# Patient Record
Sex: Female | Born: 1960
Health system: Southern US, Community
[De-identification: ages and names within clinical notes are randomized; demographics above are authoritative.]

## PROBLEM LIST (undated history)

## (undated) DIAGNOSIS — R600 Localized edema: Secondary | ICD-10-CM

## (undated) DIAGNOSIS — E1142 Type 2 diabetes mellitus with diabetic polyneuropathy: Secondary | ICD-10-CM

## (undated) DIAGNOSIS — G250 Essential tremor: Secondary | ICD-10-CM

## (undated) DIAGNOSIS — K0889 Other specified disorders of teeth and supporting structures: Secondary | ICD-10-CM

## (undated) DIAGNOSIS — R4584 Anhedonia: Secondary | ICD-10-CM

## (undated) DIAGNOSIS — Z9889 Other specified postprocedural states: Secondary | ICD-10-CM

## (undated) DIAGNOSIS — I2510 Atherosclerotic heart disease of native coronary artery without angina pectoris: Secondary | ICD-10-CM

## (undated) DIAGNOSIS — M810 Age-related osteoporosis without current pathological fracture: Secondary | ICD-10-CM

## (undated) DIAGNOSIS — M79606 Pain in leg, unspecified: Secondary | ICD-10-CM

## (undated) DIAGNOSIS — N189 Chronic kidney disease, unspecified: Secondary | ICD-10-CM

## (undated) DIAGNOSIS — R000 Tachycardia, unspecified: Secondary | ICD-10-CM

## (undated) DIAGNOSIS — E782 Mixed hyperlipidemia: Secondary | ICD-10-CM

## (undated) DIAGNOSIS — M549 Dorsalgia, unspecified: Secondary | ICD-10-CM

## (undated) DIAGNOSIS — R231 Pallor: Secondary | ICD-10-CM

## (undated) DIAGNOSIS — R633 Feeding difficulties: Secondary | ICD-10-CM

## (undated) DIAGNOSIS — E113299 Type 2 diabetes mellitus with mild nonproliferative diabetic retinopathy without macular edema, unspecified eye: Secondary | ICD-10-CM

## (undated) DIAGNOSIS — R682 Dry mouth, unspecified: Secondary | ICD-10-CM

## (undated) DIAGNOSIS — H3530 Unspecified macular degeneration: Secondary | ICD-10-CM

## (undated) DIAGNOSIS — G4700 Insomnia, unspecified: Secondary | ICD-10-CM

## (undated) DIAGNOSIS — R7989 Other specified abnormal findings of blood chemistry: Secondary | ICD-10-CM

## (undated) DIAGNOSIS — T7840XA Allergy, unspecified, initial encounter: Secondary | ICD-10-CM

## (undated) DIAGNOSIS — F5104 Psychophysiologic insomnia: Secondary | ICD-10-CM

## (undated) DIAGNOSIS — K5900 Constipation, unspecified: Secondary | ICD-10-CM

## (undated) DIAGNOSIS — R1013 Epigastric pain: Secondary | ICD-10-CM

## (undated) DIAGNOSIS — R198 Other specified symptoms and signs involving the digestive system and abdomen: Secondary | ICD-10-CM

## (undated) DIAGNOSIS — K760 Fatty (change of) liver, not elsewhere classified: Secondary | ICD-10-CM

## (undated) DIAGNOSIS — R5383 Other fatigue: Secondary | ICD-10-CM

## (undated) DIAGNOSIS — J45909 Unspecified asthma, uncomplicated: Secondary | ICD-10-CM

## (undated) DIAGNOSIS — F32A Depression, unspecified: Secondary | ICD-10-CM

## (undated) DIAGNOSIS — E66813 Obesity, class 3: Secondary | ICD-10-CM

## (undated) DIAGNOSIS — F419 Anxiety disorder, unspecified: Secondary | ICD-10-CM

## (undated) DIAGNOSIS — R1310 Dysphagia, unspecified: Secondary | ICD-10-CM

## (undated) DIAGNOSIS — E1143 Type 2 diabetes mellitus with diabetic autonomic (poly)neuropathy: Secondary | ICD-10-CM

## (undated) DIAGNOSIS — K219 Gastro-esophageal reflux disease without esophagitis: Secondary | ICD-10-CM

## (undated) DIAGNOSIS — F329 Major depressive disorder, single episode, unspecified: Secondary | ICD-10-CM

## (undated) DIAGNOSIS — E7211 Homocystinuria: Secondary | ICD-10-CM

## (undated) DIAGNOSIS — K140 Glossitis: Secondary | ICD-10-CM

## (undated) DIAGNOSIS — E211 Secondary hyperparathyroidism, not elsewhere classified: Secondary | ICD-10-CM

## (undated) DIAGNOSIS — I10 Essential (primary) hypertension: Secondary | ICD-10-CM

## (undated) DIAGNOSIS — K649 Unspecified hemorrhoids: Secondary | ICD-10-CM

## (undated) DIAGNOSIS — N951 Menopausal and female climacteric states: Secondary | ICD-10-CM

## (undated) DIAGNOSIS — R0600 Dyspnea, unspecified: Secondary | ICD-10-CM

## (undated) DIAGNOSIS — D509 Iron deficiency anemia, unspecified: Secondary | ICD-10-CM

## (undated) DIAGNOSIS — E063 Autoimmune thyroiditis: Secondary | ICD-10-CM

## (undated) DIAGNOSIS — M8580 Other specified disorders of bone density and structure, unspecified site: Secondary | ICD-10-CM

## (undated) DIAGNOSIS — E049 Nontoxic goiter, unspecified: Secondary | ICD-10-CM

## (undated) DIAGNOSIS — E119 Type 2 diabetes mellitus without complications: Secondary | ICD-10-CM

## (undated) DIAGNOSIS — R112 Nausea with vomiting, unspecified: Secondary | ICD-10-CM

## (undated) DIAGNOSIS — E785 Hyperlipidemia, unspecified: Secondary | ICD-10-CM

## (undated) HISTORY — DX: Homocystinuria: E72.11

## (undated) HISTORY — DX: Localized edema: R60.0

## (undated) HISTORY — DX: Type 2 diabetes mellitus without complications: E11.9

## (undated) HISTORY — DX: Anxiety disorder, unspecified: F41.9

## (undated) HISTORY — DX: Pallor: R23.1

## (undated) HISTORY — PX: TONSILLECTOMY: SUR1361

## (undated) HISTORY — DX: Feeding difficulties: R63.3

## (undated) HISTORY — DX: Autoimmune thyroiditis: E06.3

## (undated) HISTORY — DX: Dorsalgia, unspecified: M54.9

## (undated) HISTORY — DX: Atherosclerotic heart disease of native coronary artery without angina pectoris: I25.10

## (undated) HISTORY — DX: Pain in leg, unspecified: M79.606

## (undated) HISTORY — DX: Dry mouth, unspecified: R68.2

## (undated) HISTORY — DX: Psychophysiologic insomnia: F51.04

## (undated) HISTORY — DX: Obesity, class 3: E66.813

## (undated) HISTORY — DX: Tachycardia, unspecified: R00.0

## (undated) HISTORY — DX: Major depressive disorder, single episode, unspecified: F32.9

## (undated) HISTORY — DX: Other specified abnormal findings of blood chemistry: R79.89

## (undated) HISTORY — DX: Gastro-esophageal reflux disease without esophagitis: K21.9

## (undated) HISTORY — DX: Other specified symptoms and signs involving the digestive system and abdomen: R19.8

## (undated) HISTORY — DX: Depression, unspecified: F32.A

## (undated) HISTORY — DX: Glossitis: K14.0

## (undated) HISTORY — DX: Epigastric pain: R10.13

## (undated) HISTORY — DX: Fatty (change of) liver, not elsewhere classified: K76.0

## (undated) HISTORY — DX: Constipation, unspecified: K59.00

## (undated) HISTORY — DX: Other specified postprocedural states: Z98.890

## (undated) HISTORY — DX: Nausea with vomiting, unspecified: R11.2

## (undated) HISTORY — DX: Iron deficiency anemia, unspecified: D50.9

## (undated) HISTORY — DX: Type 2 diabetes mellitus with diabetic autonomic (poly)neuropathy: E11.43

## (undated) HISTORY — DX: Other specified disorders of bone density and structure, unspecified site: M85.80

## (undated) HISTORY — DX: Age-related osteoporosis without current pathological fracture: M81.0

## (undated) HISTORY — DX: Other fatigue: R53.83

## (undated) HISTORY — DX: Dysphagia, unspecified: R13.10

## (undated) HISTORY — DX: Hyperlipidemia, unspecified: E78.5

## (undated) HISTORY — DX: Essential tremor: G25.0

## (undated) HISTORY — DX: Secondary hyperparathyroidism, not elsewhere classified: E21.1

## (undated) HISTORY — DX: Chronic kidney disease, unspecified: N18.9

## (undated) HISTORY — DX: Type 2 diabetes mellitus with mild nonproliferative diabetic retinopathy without macular edema, unspecified eye: E11.3299

## (undated) HISTORY — DX: Other specified disorders of teeth and supporting structures: K08.89

## (undated) HISTORY — DX: Mixed hyperlipidemia: E78.2

## (undated) HISTORY — DX: Hypocalcemia: E83.51

## (undated) HISTORY — PX: CARDIAC CATHETERIZATION: SHX172

## (undated) HISTORY — DX: Unspecified asthma, uncomplicated: J45.909

## (undated) HISTORY — DX: Morbid (severe) obesity due to excess calories: E66.01

## (undated) HISTORY — DX: Type 2 diabetes mellitus with diabetic polyneuropathy: E11.42

## (undated) HISTORY — DX: Insomnia, unspecified: G47.00

## (undated) HISTORY — PX: ROUX-EN-Y PROCEDURE: SUR1287

## (undated) HISTORY — DX: Unspecified hemorrhoids: K64.9

## (undated) HISTORY — DX: Dyspnea, unspecified: R06.00

## (undated) HISTORY — DX: Essential (primary) hypertension: I10

## (undated) HISTORY — DX: Unspecified macular degeneration: H35.30

## (undated) HISTORY — DX: Allergy, unspecified, initial encounter: T78.40XA

## (undated) HISTORY — DX: Nontoxic goiter, unspecified: E04.9

## (undated) HISTORY — DX: Menopausal and female climacteric states: N95.1

## (undated) HISTORY — DX: Anhedonia: R45.84

---

## 1997-07-10 ENCOUNTER — Encounter: Admission: RE | Admit: 1997-07-10 | Discharge: 1997-07-10 | Payer: Self-pay | Admitting: Sports Medicine

## 1998-05-23 ENCOUNTER — Ambulatory Visit (HOSPITAL_COMMUNITY): Admission: RE | Admit: 1998-05-23 | Discharge: 1998-05-23 | Payer: Self-pay | Admitting: *Deleted

## 1998-05-24 ENCOUNTER — Encounter: Admission: RE | Admit: 1998-05-24 | Discharge: 1998-08-22 | Payer: Self-pay | Admitting: *Deleted

## 1999-01-09 ENCOUNTER — Emergency Department (HOSPITAL_COMMUNITY): Admission: EM | Admit: 1999-01-09 | Discharge: 1999-01-09 | Payer: Self-pay | Admitting: Emergency Medicine

## 1999-01-09 ENCOUNTER — Encounter: Payer: Self-pay | Admitting: Emergency Medicine

## 1999-06-26 ENCOUNTER — Other Ambulatory Visit: Admission: RE | Admit: 1999-06-26 | Discharge: 1999-06-26 | Payer: Self-pay | Admitting: *Deleted

## 1999-10-22 ENCOUNTER — Encounter: Admission: RE | Admit: 1999-10-22 | Discharge: 1999-10-22 | Payer: Self-pay | Admitting: *Deleted

## 1999-12-02 ENCOUNTER — Ambulatory Visit (HOSPITAL_COMMUNITY): Admission: RE | Admit: 1999-12-02 | Discharge: 1999-12-02 | Payer: Self-pay | Admitting: *Deleted

## 2000-09-02 ENCOUNTER — Other Ambulatory Visit: Admission: RE | Admit: 2000-09-02 | Discharge: 2000-09-02 | Payer: Self-pay | Admitting: Internal Medicine

## 2000-12-10 ENCOUNTER — Encounter: Payer: Self-pay | Admitting: Internal Medicine

## 2000-12-10 ENCOUNTER — Ambulatory Visit (HOSPITAL_COMMUNITY): Admission: RE | Admit: 2000-12-10 | Discharge: 2000-12-10 | Payer: Self-pay | Admitting: Internal Medicine

## 2001-08-29 ENCOUNTER — Other Ambulatory Visit: Admission: RE | Admit: 2001-08-29 | Discharge: 2001-08-29 | Payer: Self-pay | Admitting: Internal Medicine

## 2001-09-08 ENCOUNTER — Encounter: Payer: Self-pay | Admitting: *Deleted

## 2001-09-08 ENCOUNTER — Inpatient Hospital Stay (HOSPITAL_COMMUNITY): Admission: AD | Admit: 2001-09-08 | Discharge: 2001-09-09 | Payer: Self-pay | Admitting: *Deleted

## 2001-11-17 ENCOUNTER — Ambulatory Visit (HOSPITAL_COMMUNITY): Admission: RE | Admit: 2001-11-17 | Discharge: 2001-11-17 | Payer: Self-pay | Admitting: *Deleted

## 2002-09-18 ENCOUNTER — Other Ambulatory Visit: Admission: RE | Admit: 2002-09-18 | Discharge: 2002-09-18 | Payer: Self-pay | Admitting: Internal Medicine

## 2002-09-21 ENCOUNTER — Ambulatory Visit (HOSPITAL_COMMUNITY): Admission: RE | Admit: 2002-09-21 | Discharge: 2002-09-21 | Payer: Self-pay | Admitting: Internal Medicine

## 2002-09-21 ENCOUNTER — Encounter: Payer: Self-pay | Admitting: Internal Medicine

## 2002-09-21 ENCOUNTER — Encounter: Admission: RE | Admit: 2002-09-21 | Discharge: 2002-09-21 | Payer: Self-pay | Admitting: Internal Medicine

## 2004-08-20 ENCOUNTER — Ambulatory Visit: Payer: Self-pay | Admitting: "Endocrinology

## 2004-09-22 ENCOUNTER — Ambulatory Visit: Payer: Self-pay | Admitting: "Endocrinology

## 2004-10-30 ENCOUNTER — Other Ambulatory Visit: Admission: RE | Admit: 2004-10-30 | Discharge: 2004-10-30 | Payer: Self-pay | Admitting: Internal Medicine

## 2004-10-31 ENCOUNTER — Ambulatory Visit: Payer: Self-pay | Admitting: "Endocrinology

## 2004-11-10 ENCOUNTER — Ambulatory Visit: Payer: Self-pay

## 2004-11-10 ENCOUNTER — Ambulatory Visit (HOSPITAL_COMMUNITY): Admission: RE | Admit: 2004-11-10 | Discharge: 2004-11-10 | Payer: Self-pay | Admitting: Internal Medicine

## 2004-11-12 ENCOUNTER — Ambulatory Visit: Payer: Self-pay

## 2004-11-17 ENCOUNTER — Ambulatory Visit: Payer: Self-pay | Admitting: "Endocrinology

## 2005-04-01 ENCOUNTER — Emergency Department (HOSPITAL_COMMUNITY): Admission: EM | Admit: 2005-04-01 | Discharge: 2005-04-01 | Payer: Self-pay | Admitting: Family Medicine

## 2005-04-23 ENCOUNTER — Ambulatory Visit (HOSPITAL_COMMUNITY): Admission: RE | Admit: 2005-04-23 | Discharge: 2005-04-23 | Payer: Self-pay | Admitting: Internal Medicine

## 2005-06-26 ENCOUNTER — Inpatient Hospital Stay (HOSPITAL_COMMUNITY): Admission: EM | Admit: 2005-06-26 | Discharge: 2005-06-27 | Payer: Self-pay | Admitting: Emergency Medicine

## 2005-06-26 ENCOUNTER — Ambulatory Visit: Payer: Self-pay | Admitting: *Deleted

## 2005-06-26 ENCOUNTER — Encounter: Payer: Self-pay | Admitting: Cardiology

## 2005-07-14 ENCOUNTER — Ambulatory Visit: Payer: Self-pay | Admitting: Cardiology

## 2005-10-26 ENCOUNTER — Ambulatory Visit: Payer: Self-pay | Admitting: "Endocrinology

## 2005-10-29 ENCOUNTER — Other Ambulatory Visit: Admission: RE | Admit: 2005-10-29 | Discharge: 2005-10-29 | Payer: Self-pay | Admitting: Internal Medicine

## 2006-03-16 ENCOUNTER — Encounter: Admission: RE | Admit: 2006-03-16 | Discharge: 2006-06-14 | Payer: Self-pay | Admitting: "Endocrinology

## 2006-04-28 ENCOUNTER — Ambulatory Visit: Payer: Self-pay | Admitting: "Endocrinology

## 2006-05-11 ENCOUNTER — Ambulatory Visit (HOSPITAL_COMMUNITY): Admission: RE | Admit: 2006-05-11 | Discharge: 2006-05-11 | Payer: Self-pay | Admitting: Internal Medicine

## 2006-07-13 ENCOUNTER — Encounter: Admission: RE | Admit: 2006-07-13 | Discharge: 2006-10-11 | Payer: Self-pay | Admitting: "Endocrinology

## 2006-08-04 ENCOUNTER — Ambulatory Visit (HOSPITAL_COMMUNITY): Admission: RE | Admit: 2006-08-04 | Discharge: 2006-08-04 | Payer: Self-pay | Admitting: Surgery

## 2006-08-11 ENCOUNTER — Ambulatory Visit: Payer: Self-pay | Admitting: "Endocrinology

## 2006-08-11 ENCOUNTER — Ambulatory Visit (HOSPITAL_COMMUNITY): Admission: RE | Admit: 2006-08-11 | Discharge: 2006-08-11 | Payer: Self-pay | Admitting: Surgery

## 2006-10-14 ENCOUNTER — Ambulatory Visit (HOSPITAL_COMMUNITY): Admission: RE | Admit: 2006-10-14 | Discharge: 2006-10-14 | Payer: Self-pay | Admitting: Surgery

## 2006-10-21 ENCOUNTER — Encounter: Admission: RE | Admit: 2006-10-21 | Discharge: 2007-01-19 | Payer: Self-pay | Admitting: "Endocrinology

## 2006-11-01 ENCOUNTER — Other Ambulatory Visit: Admission: RE | Admit: 2006-11-01 | Discharge: 2006-11-01 | Payer: Self-pay | Admitting: Internal Medicine

## 2006-12-20 ENCOUNTER — Inpatient Hospital Stay (HOSPITAL_COMMUNITY): Admission: RE | Admit: 2006-12-20 | Discharge: 2006-12-22 | Payer: Self-pay | Admitting: Surgery

## 2006-12-21 ENCOUNTER — Ambulatory Visit: Payer: Self-pay | Admitting: *Deleted

## 2006-12-21 ENCOUNTER — Encounter (INDEPENDENT_AMBULATORY_CARE_PROVIDER_SITE_OTHER): Payer: Self-pay | Admitting: Surgery

## 2006-12-28 ENCOUNTER — Ambulatory Visit: Payer: Self-pay | Admitting: "Endocrinology

## 2006-12-28 ENCOUNTER — Encounter: Admission: RE | Admit: 2006-12-28 | Discharge: 2007-03-28 | Payer: Self-pay | Admitting: "Endocrinology

## 2007-02-17 ENCOUNTER — Ambulatory Visit: Payer: Self-pay | Admitting: "Endocrinology

## 2007-04-06 ENCOUNTER — Encounter: Admission: RE | Admit: 2007-04-06 | Discharge: 2007-04-06 | Payer: Self-pay | Admitting: Surgery

## 2007-04-18 ENCOUNTER — Ambulatory Visit: Payer: Self-pay | Admitting: "Endocrinology

## 2007-08-10 ENCOUNTER — Ambulatory Visit: Payer: Self-pay | Admitting: "Endocrinology

## 2007-09-07 ENCOUNTER — Ambulatory Visit (HOSPITAL_COMMUNITY): Admission: RE | Admit: 2007-09-07 | Discharge: 2007-09-07 | Payer: Self-pay | Admitting: Internal Medicine

## 2007-11-01 ENCOUNTER — Ambulatory Visit (HOSPITAL_COMMUNITY): Admission: RE | Admit: 2007-11-01 | Discharge: 2007-11-01 | Payer: Self-pay | Admitting: Surgery

## 2007-11-15 ENCOUNTER — Ambulatory Visit: Payer: Self-pay | Admitting: Internal Medicine

## 2007-11-15 ENCOUNTER — Other Ambulatory Visit: Admission: RE | Admit: 2007-11-15 | Discharge: 2007-11-15 | Payer: Self-pay | Admitting: Internal Medicine

## 2007-12-27 ENCOUNTER — Ambulatory Visit: Payer: Self-pay | Admitting: "Endocrinology

## 2008-05-23 ENCOUNTER — Ambulatory Visit: Payer: Self-pay | Admitting: "Endocrinology

## 2008-10-23 ENCOUNTER — Ambulatory Visit: Payer: Self-pay | Admitting: "Endocrinology

## 2008-10-23 ENCOUNTER — Ambulatory Visit (HOSPITAL_COMMUNITY): Admission: RE | Admit: 2008-10-23 | Discharge: 2008-10-23 | Payer: Self-pay | Admitting: Internal Medicine

## 2008-10-23 LAB — HM MAMMOGRAPHY

## 2008-11-05 ENCOUNTER — Ambulatory Visit: Payer: Self-pay | Admitting: Internal Medicine

## 2008-11-05 ENCOUNTER — Other Ambulatory Visit: Admission: RE | Admit: 2008-11-05 | Discharge: 2008-11-05 | Payer: Self-pay | Admitting: Internal Medicine

## 2009-02-11 ENCOUNTER — Ambulatory Visit: Payer: Self-pay | Admitting: Internal Medicine

## 2009-03-08 ENCOUNTER — Emergency Department (HOSPITAL_COMMUNITY): Admission: EM | Admit: 2009-03-08 | Discharge: 2009-03-08 | Payer: Self-pay | Admitting: Family Medicine

## 2009-03-19 ENCOUNTER — Encounter: Admission: RE | Admit: 2009-03-19 | Discharge: 2009-05-06 | Payer: Self-pay | Admitting: Internal Medicine

## 2009-09-20 ENCOUNTER — Ambulatory Visit: Payer: Self-pay | Admitting: Internal Medicine

## 2009-11-07 ENCOUNTER — Ambulatory Visit: Payer: Self-pay | Admitting: Internal Medicine

## 2010-01-31 ENCOUNTER — Ambulatory Visit
Admission: RE | Admit: 2010-01-31 | Discharge: 2010-01-31 | Payer: Self-pay | Source: Home / Self Care | Attending: "Endocrinology | Admitting: "Endocrinology

## 2010-02-15 ENCOUNTER — Encounter: Payer: Self-pay | Admitting: Internal Medicine

## 2010-03-10 ENCOUNTER — Ambulatory Visit (INDEPENDENT_AMBULATORY_CARE_PROVIDER_SITE_OTHER): Payer: Commercial Managed Care - PPO | Admitting: "Endocrinology

## 2010-03-10 DIAGNOSIS — E1069 Type 1 diabetes mellitus with other specified complication: Secondary | ICD-10-CM

## 2010-03-10 DIAGNOSIS — E1065 Type 1 diabetes mellitus with hyperglycemia: Secondary | ICD-10-CM

## 2010-03-10 DIAGNOSIS — IMO0002 Reserved for concepts with insufficient information to code with codable children: Secondary | ICD-10-CM

## 2010-03-10 DIAGNOSIS — E063 Autoimmune thyroiditis: Secondary | ICD-10-CM

## 2010-03-10 DIAGNOSIS — E213 Hyperparathyroidism, unspecified: Secondary | ICD-10-CM

## 2010-04-14 ENCOUNTER — Ambulatory Visit (INDEPENDENT_AMBULATORY_CARE_PROVIDER_SITE_OTHER): Payer: Commercial Managed Care - PPO | Admitting: Internal Medicine

## 2010-04-14 DIAGNOSIS — M5430 Sciatica, unspecified side: Secondary | ICD-10-CM

## 2010-05-13 ENCOUNTER — Other Ambulatory Visit: Payer: Self-pay | Admitting: *Deleted

## 2010-05-13 ENCOUNTER — Encounter: Payer: Self-pay | Admitting: *Deleted

## 2010-05-13 DIAGNOSIS — IMO0001 Reserved for inherently not codable concepts without codable children: Secondary | ICD-10-CM

## 2010-05-13 DIAGNOSIS — I10 Essential (primary) hypertension: Secondary | ICD-10-CM

## 2010-05-13 DIAGNOSIS — E7800 Pure hypercholesterolemia, unspecified: Secondary | ICD-10-CM | POA: Insufficient documentation

## 2010-05-13 DIAGNOSIS — E119 Type 2 diabetes mellitus without complications: Secondary | ICD-10-CM | POA: Insufficient documentation

## 2010-05-13 DIAGNOSIS — E669 Obesity, unspecified: Secondary | ICD-10-CM | POA: Insufficient documentation

## 2010-05-13 DIAGNOSIS — E1159 Type 2 diabetes mellitus with other circulatory complications: Secondary | ICD-10-CM | POA: Insufficient documentation

## 2010-06-09 ENCOUNTER — Other Ambulatory Visit: Payer: Self-pay | Admitting: "Endocrinology

## 2010-06-09 ENCOUNTER — Ambulatory Visit (INDEPENDENT_AMBULATORY_CARE_PROVIDER_SITE_OTHER): Payer: Commercial Managed Care - PPO | Admitting: "Endocrinology

## 2010-06-09 ENCOUNTER — Encounter: Payer: Self-pay | Admitting: "Endocrinology

## 2010-06-09 VITALS — BP 133/93 | HR 85 | Wt 191.0 lb

## 2010-06-09 DIAGNOSIS — I10 Essential (primary) hypertension: Secondary | ICD-10-CM

## 2010-06-09 DIAGNOSIS — E211 Secondary hyperparathyroidism, not elsewhere classified: Secondary | ICD-10-CM

## 2010-06-09 DIAGNOSIS — E7800 Pure hypercholesterolemia, unspecified: Secondary | ICD-10-CM

## 2010-06-09 DIAGNOSIS — R231 Pallor: Secondary | ICD-10-CM

## 2010-06-09 DIAGNOSIS — IMO0001 Reserved for inherently not codable concepts without codable children: Secondary | ICD-10-CM

## 2010-06-09 DIAGNOSIS — E559 Vitamin D deficiency, unspecified: Secondary | ICD-10-CM

## 2010-06-09 DIAGNOSIS — E049 Nontoxic goiter, unspecified: Secondary | ICD-10-CM

## 2010-06-09 LAB — LIPID PANEL
Cholesterol: 175 mg/dL (ref 0–200)
HDL: 62 mg/dL (ref >39)
Total CHOL/HDL Ratio: 2.8 Ratio
Triglycerides: 76 mg/dL (ref <150)
VLDL: 15 mg/dL (ref 0–40)

## 2010-06-09 LAB — POCT GLYCOSYLATED HEMOGLOBIN (HGB A1C): Hemoglobin A1C: 6.6

## 2010-06-09 LAB — T3, FREE: T3, Free: 2.7 pg/mL (ref 2.3–4.2)

## 2010-06-09 LAB — GLUCOSE, POCT (MANUAL RESULT ENTRY): POC Glucose: 130

## 2010-06-09 LAB — TSH: TSH: 1.217 u[IU]/mL (ref 0.350–4.500)

## 2010-06-09 NOTE — Progress Notes (Addendum)
CC: FU T2DM, obesity, goiter, Vitamin d deficiency, secondary hyperparathyroidism, hyperlipidemia, hypertension, thyroiditis, anemia, pallor, GERD, NPDR  HPI: 50 y.o. Caucasian woman 1. Onset of obesity about beginning in teen years. Maximum weight was 295 pounds. 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 Lost weight progressively to 169 pounds, but then regained to 191.  2. Diagnosed with T2DM on her 37th birthday in 1999. HbA1c was up to 9.8 % in 2006 and 9.5 % just prior to her gastric bypass. Her HbA1c reached a nadir of 6.3 % in December 2009, but increased thereafter. Her HbA1cl of 6.6 % is the lowest she's had since 2009.  3. Ms. Clements' last [facility] visit was on 02.13.12. In the interim she has been healthy. She has resumed walking. She has also been better about taking her medications, but still sometimes misses her second calcium-Vitamin d pill. She is taking her metformin, 500 mg tablets, twice daily. 4. PROS: Constitutional: The patient feels well and has few significant complaints. Eyes: Vision is becoming presbyopic. She is due for a repeat dilated eye exam in the Summer.  Neck: The patient has no complaints of anterior neck swelling, soreness, tenderness,  pressure, discomfort, or difficulty swallowing.  Heart: She occasionally has mid-left chest pain which radiates down her left arm. She has been evaluated by cardiology, to include two negative cardiac caths, but no diagnosis has been made.   Gastrointestinal: Bowel movents seem normal. She has GERD occasionally. Legs: Muscle mass and strength seem normal. There are no complaints of numbness, tingling, burning, or pain. No edema is noted. Feet: There are no obvious foot problems. There are no complaints of numbness, tingling, burning, or pain. No edema is noted. GYN: LMP was in April.  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  ROS: Ms. Deitrick does not have any other significant issues  involving her other eleven body systems.  PHYSICAL EXAM: BP 133/93  Pulse 85  Wt 191 lb (86.637 kg) HbA1c 6.6% Constitutional: The patient looks healthy and appears physically and emotionally well. She is obese. Eyes: There is no arcus or proptosis.  Mouth: The oropharynx appears normal. The tongue appears normal. There is normal oral moisture. There is no obvious gingivitis. Neck: There are no bruits present. The thyroid gland appears normal in size. The thyroid gland is approximately 20+ grams in size. The consistency of the thyroid gland is relatively firm. There is no thyroid tenderness to palpation. Lungs: The lungs are clear. Air movement is good. Heart: The heart rhythm and rate appear normal. Heart sounds S1 and S2 are normal. I do not appreciate any pathologic heart murmurs. Abdomen: The abdominal size is enlarged/slim. Bowel sounds are normal. The abdomen is soft and non-tender. There is no obviously palpable hepatomegaly, splenomegaly, or other masses.  Arms: Muscle mass appears appropriate for age.  Hands: There is no obvious tremor. Phalangeal and metacarpophalangeal joints appear normal. Palms are normal. Legs: Muscle mass appears appropriate for age. There is no edema.  Feet: There are no significant deformities. Dorsalis pedis pulses are normal 1+ bilaterally.  Neurologic: Muscle strength is normal for age and gender  in both the upper and the lower extremities. Muscle tone appears normal. Sensation to touch is normal in the legs and feet.  Labs 01.04.12:   ASSESSMENT: 1. T2DM: Ms. Zhao is definitely doing better. Her HbA1c is a full point lower. She is working harder at controlling her BGs. 2. Obesity: Her weight is stable at 190-191.  3. Hypertension: Although we could go up on her BP meds, I've asked her to walk more as an alternative therapy. She agrees. 4. Goiter: She was borderline hypothyroid in January. We will repeat her TFTs today. 5. Vitamin D deficiency: She was  low again i  January. Will recheck now. 6. Secondary hyperparathyroid: Her PTH was again higher in February. Her PTH will normalize again if she takes her calcium-Vitamin D supplements as requested.   PLAN: 1. Calcium, PTH, Vitamin D, TFTs, lipid panel 2. Continue current meds. Consider Bydureon. 3. FU appointment in 3 months.

## 2010-06-09 NOTE — Patient Instructions (Signed)
Please try to fit in all doses of calcium and Vitamin D. Please try to fit in an hour of exercise at least five times weekly.

## 2010-06-10 LAB — PTH, INTACT AND CALCIUM: Calcium, Total (PTH): 9.5 mg/dL (ref 8.4–10.5)

## 2010-06-10 NOTE — Op Note (Signed)
Alexa [name], Alexa [name]                ACCOUNT NO.:  [id number]   MEDICAL RECORD NO.:  [id number]          PATIENT TYPE:  INP   LOCATION:  X001                         FACILITY:  [facility]   PHYSICIAN:  Thornton Park. Daphine Deutscher, MD  DATE OF BIRTH:  1960-07-16   DATE OF PROCEDURE:  12/20/2006  DATE OF DISCHARGE:                               OPERATIVE REPORT   PROCEDURE:  Endoscopy during lap Roux-en-Y gastric bypass.   SURGEON:  Thornton Park. Daphine Deutscher, MD   HISTORY:  Thus is a 50 year old lady undergoing a lap Roux-en-Y gastric  bypass by Dr. Ezzard Standing.   At the completion of the gastrojejunostomy,  I went up to the head of  the table and passed the flexible endoscope.  The GE junction was about  38 cm and the pouch leak was about 5 cm.  I insufflated and there was no  evidence of bubbles on the inside and no evidence of bleeding on the  inside.  I then decompressed the  pouch and withdrew the scope.  The  patient tolerated the procedure well.      Thornton Park Daphine Deutscher, MD  Electronically Signed     MBM/MEDQ  D:  12/20/2006  T:  12/20/2006  Job:  [id number]

## 2010-06-10 NOTE — Op Note (Signed)
NAMELORRAINE, Alexa [name] NO.:  [id number]   MEDICAL RECORD NO.:  [id number]          PATIENT TYPE:  INP   LOCATION:  X001                         FACILITY:  [facility]   PHYSICIAN:  Sandria Bales. Ezzard Standing, M.D.  DATE OF BIRTH:  1960-05-07   DATE OF PROCEDURE:  12/20/2006  DATE OF DISCHARGE:                               OPERATIVE REPORT   PREOPERATIVE DIAGNOSES:  Morbid obesity (weight 261, BMI of 46.3).   POSTOPERATIVE DIAGNOSES:  Morbid obesity (weight 261, BMI of 46.3).   PROCEDURE:  Laparoscopic Roux-en-Y gastric bypass (retrocolic,  retrogastric) and upper endoscopy Dr. Wenda Low.   SURGEON:  Dr. Ezzard Standing.   FIRST ASSISTANT:  Dr. Luretha Murphy.   ANESTHESIA:  General endotracheal with about 50 mL of 0.25% Marcaine.   COMPLICATIONS:  None.   INDICATIONS FOR PROCEDURE:  Alexa [name] is a 50 year old white female who  is a patient of Dr. Sharlet Salina and sees Molli Knock for help  with her diabetes who has been morbidly base much of her adult life.  She had been through our preoperative bariatric program and now comes  for attempted laparoscopic Roux-en-Y gastric bypass.   The indications and potential complications of the procedure were  explained to the patient.  The potential complications include but not  limited to bleeding, infection, bowel leak, deep venous thrombosis, open  surgery, and long-term nutritional consequences.   DESCRIPTION OF PROCEDURE:  The patient placed in the supine position  under general endotracheal anesthetic.  She had a Foley catheter in  place, PAS stockings placed.  She had an OG tube placed.  She was given  2 grams of cefoxitin, she was given heparin preoperatively. A timeout  was held to identify the patient and procedure.   Her abdomen was prepped with Betadine solution and sterilely draped. I  accessed her abdominal cavity with a 12-mm Ethicon trocar in the left  upper quadrant.  I then placed six additional trocars. I  placed a 5-mm  subxiphoid trocar, a 12-mm right subcostal trocar.  A 12-mm right  paramedian trocar, a 12 mm left paramedian trocar, a 5 mm left upper  quadrant trocar and an 11 mm trocar beside her umbilicus.   I carried out abdominal exploration, right and left lobes of the liver  were unremarkable.  The anterior wall of the stomach was unremarkable.  The bowel that I could see was unremarkable.   I then turned my attention to first identifying her small bowel and the  ligament of Treitz. I counted 40 cm and divided the small bowel with a  white load of the 45 mm Endo GIA.   I then counted 100 cm for the future gastric limb and carried out a side-  to-side jejunojejunostomy and I did first a stapled side-to-side with a  white load of the 45 mm Endo-GIA stapler and I closed the enterotomy  with two running 2-0 Vicryl sutures but one oversewed stitch from the  jejunojejunostomy.  I then closed the jejunal mesentery defect with a  running 2-0 silk suture.   I then turned my  attention to the stomach where I put a liver retractor  under the left lobe of liver. Using the iron man retractor, I found the  angle of Hiss. I then carried them down about 4 or 5 cm along the lesser  curvature and got behind the stomach.  I did first a firing of the 45  blue stapler.  I then did two firings of the 60 mm Endo-GIA stapler with  Ethicon and then I did a single firing of the 45 and completed the  division.  This left a pouch approximately 4-5 cm in length and  approximately 3 cm in width.   I then brought the jejunal limb anticolic, antigastric, the candy cane  limb faced to the left. I then sewed the limb to the posterior wall of  the stomach pouch.  Prior to doing that, I did oversew the distal  gastric remnant with a running 2-0 Vicryl suture and I placed some  Tisseel along the greater curvature.   After completion of the posterior wall of the gastrojejunostomy, I then  made an incision in  the stomach over the Ewald tube and made an incision  in the jejunal limb. I did a firing of the blue load of the 45 Endo-GIA.  I tried to create about a 2 cm anastomosis.   I then closed the enterotomy with two running 2-0 Vicryl sutures and  again had the put an oversew stitch in the gastrojejunostomy.  I then  had the Ewald tube passed through the gastrojejunostomy and oversewed  for an anterior row.   At this point, I went down and looked at Peterson's defect but I really  did not think there was anything I could really close well. I was  concerned about actually aggravating that area.   At this point, Dr. Daphine Deutscher broke scrub. He did an upper endoscopy while I  clamped the small bowel. He saw the EG junction at about 40 cm, the  gastrojejunal anastomosis about 45 cm for about a 5 cm pouch. He pumped  air in while I clamped off the bowel. I saw no air leaks, he saw a  patent anastomosis and the mucosa was viable.   He will dictate this portion of the operation. I did irrigate the  bladder with about a liter of saline. I then aspirated this fluid out.  I placed Tisseel on gastric pouch along the greater curvature, on the  gastrojejunostomy and the jejunojejunostomy for a total about 10 mL of  Tisseel.   At this point, the trocars were all removed in turn, the skin at each  site was closed with a 5-0 Vicryl suture painted with Dermabond and  these were all directly visualized.   The patient tolerated the procedure well and was transported to the  recovery room in good condition.  Sponge and needle counts were correct  at the end of the case.      Sandria Bales. Ezzard Standing, M.D.  Electronically Signed     DHN/MEDQ  D:  12/20/2006  T:  12/20/2006  Job:  [id number]   cc:   Luanna Cole. Lenord Fellers, M.D.  Fax: [phone]   David Stall, M.D.  Fax: [phone]

## 2010-06-13 NOTE — H&P (Signed)
NAME:  Alexa [name], Alexa [name]                          ACCOUNT NO.:  [id number]   MEDICAL RECORD NO.:  [id number]                   PATIENT TYPE:  INP   LOCATION:  2008                                 FACILITY:  [facility]   PHYSICIAN:  Veneda Melter, M.D. [facility]               DATE OF BIRTH:  08-Dec-1960   DATE OF ADMISSION:  09/08/2001  DATE OF DISCHARGE:  09/09/2001                                HISTORY & PHYSICAL   HISTORY OF PRESENT ILLNESS:  The patient is a 50 year old female patient of  Alexa [name], D.O. who began experiencing epigastric discomfort which  she describes as a hot sensation early this morning.  She did feel her  heart speed up.  This was accompanied by nausea and diaphoresis.  She  states she hooked herself up to monitor at work and noticed her heart rate  in the upper 120s.  She has no previous history of heart disease, however  did undergo a dobutamine Cardiolite in August 2002 which revealed normal  perfusion with an ejection fraction of 63%.   There is a family history of heart disease of her grandfather in his 61s.   PAST MEDICAL HISTORY:  1. Significant for diabetes mellitus, on Lantus and oral agents.  2. Gastroesophageal reflux disease.  3. Hypertension.  4. Hypercholesterolemia.  5. Asthma.  6. Obesity.  7. Family history of heart disease.   CURRENT MEDICATIONS:  Lantus 10 units q. h.s., Clarinex 5 mg q.d. p.r.n.,  Prilosec 20 mg q.d. Singular 10 mg q.d., Amaryl 4 mg q.d., Zetia 10 mg q.d.,  hydrochlorothiazide 25 mg q.d., Norvasc 10 mg q.d., Accupril 40 mg q.d.,  Glucophage XR 500 mg 2 tablets b.i.d., Advair 250/50 one inhalation b.i.d.,  albuterol p.r.n., Ortho-Novum birth control pills (she has stopped these as  of this week).   ALLERGIES:  Sensitive to theophylline.   SOCIAL HISTORY:  No tobacco, alcohol or illicit drug use.  She is a Engineer, civil (consulting),  married.   FAMILY HISTORY:  Dad alive at age 19 with a history of hypertension,  diabetes and high  cholesterol.  Mom alive at age 9, history of  hypothyroidism.   Grandparents, maternal grandmother with a history of diabetes and she is in  her 63s.  Maternal grandfather alive in his 102s with heart disease.  Paternal grandfather deceased in his 8s secondary to a heart attack.  Paternal grandmother deceased in her 30s secondary to stroke.  She has 3  brothers alive and well, 2 sisters, one with hypothyroidism.   PHYSICAL EXAMINATION:  VITAL SIGNS:  Weight 264, blood pressure right arm  128/88, left arm 127/98, pulse 107.  Orthostatics reveal a supine blood  pressure of 142/84, pulse 104, sitting 120/70, pulse 112, standing 130/80,  pulse 112, this was repeated after 30 minutes.  Standing her blood pressure  was 132/90 with a pulse of 112.  HEENT:  __________ .  NECK:  No jugular venous distention or thyromegaly.  CHEST:  Clear to auscultation bilaterally.  HEART:  Regular rate and rhythm, no ectopy.  She does have a soft 1/6  systolic murmur at the left sternal border.  No diastolic component.  ABDOMEN:  Obese, bowel sounds present, nontender, nondistended, no masses,  no femoral bruits.  LOWER EXTREMITY:  Trace peripheral edema, palpable pedal pulses.   Electrocardiogram today reveals sinus tachycardia, rate 107 with no acute ST-  T wave changes.   ASSESSMENT/PLAN:  1. Atypical chest pain.  2. Diabetes mellitus, Lantus plus oral agents.  3. Hyperlipidemia, treated.  4. Hypertension, treated.  5. Family history of coronary artery disease.  6. Gastroesophageal reflux disease.  7. Sinus tachycardia.   The patient was seen and examined by Veneda Melter, M.D. [facility].  She will be  admitted to [facility] and we will perform several lab studies including  serial cardiac isoenzymes and electrolytes as well as CBC.  She will need a  urine pregnancy test.  If this is negative she needs to have a CT of her  chest to assure no pulmonary embolism.  If negative we will go ahead and   proceed with cardiac catheterization in the morning.     Guy Franco, PA [facility]                        Veneda Melter, M.D. [facility]    LB/MEDQ  D:  09/08/2001  T:  09/12/2001  Job:  [id number]   cc:   Lovenia Kim, D.O.

## 2010-06-13 NOTE — H&P (Signed)
NAMEJAVONA, BERGEVIN NO.:  [id number]   MEDICAL RECORD NO.:  [id number]          PATIENT TYPE:  EMS   LOCATION:  MAJO                         FACILITY:  [facility]   PHYSICIAN:  Veneda Melter, M.D.      DATE OF BIRTH:  [birth year]   DATE OF ADMISSION:  06/25/2005  DATE OF DISCHARGE:                                HISTORY & PHYSICAL   CHIEF COMPLAINT:  Chest pain.   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  The  patient reports the pain as substernal with left arm radiation, occasionally  associated with shortness of breath, worsening with exertion, partially  relieved with rest.  The patient has had these symptoms ongoing.  Underwent  a stress test apparently 2 years ago, which was also unremarkable, per her  report.  She presents because of the chest pain appears to be worsening in  its frequency, as well as intensity.   PAST MEDICAL HISTORY:  1.  Atypical chest pain status post negative work-up.  2.  Diabetes.  3.  GERD.  4.  Asthma.  5.  Hypertension.   SOCIAL HISTORY:  No tobacco, no alcohol.   FAMILY HISTORY:  A strong family history of coronary artery disease.   REVIEW OF SYSTEMS:  Otherwise normal.   ALLERGIES:  THEOPHYLLINE.   MEDICATIONS:  1.  Accupril.  2.  Glucotrol.  3.  Insulin.   PHYSICAL EXAMINATION:  VITAL SIGNS:  Temperature 98.7, pulse 90, respiratory  rate 14, blood pressure 130/74.  GENERAL:  She is an obese woman in no distress.  NECK:  Supple.  Thyroid is normal.  LUNGS:  Clear to auscultation bilaterally.  CARDIOVASCULAR:  I am unable to appreciate JVP due to body habitus.  Carotid  pulses are 2+, as are radial and femoral pulses bilaterally.  Normal  contour.  PMI is _________  deviated.  Normal S1 and S2 with no murmurs,  rubs, or gallops.  ABDOMEN:  Obese, soft.  No Murphy's sign.  No guarding.  No rebound  tenderness.  EXTREMITIES:  Obesity, as well as edema.   CHEST X-RAY:  Pending.   ELECTROCARDIOGRAM:  Normal sinus rhythm, low voltage.  No current of injury.   LABORATORY DATA:  Unremarkable, including plan of care cardiac enzymes.   ASSESSMENT AND PLAN:  This is a 50 year old woman with atypical chest pain.  Will admit for telemetry and cardiac enzymes.  Will initiate a beta blocker,  as well as a proton pump inhibitor.  Will advise patient based on results of  the above work-up.      Denyse Amass, MD  Electronically Signed     ______________________________  Veneda Melter, M.D.    DBH/MEDQ  D:  06/26/2005  T:  06/26/2005  Job:  [id number]

## 2010-06-13 NOTE — Discharge Summary (Signed)
NAME:  Alexa [name], BUCKNAM                          ACCOUNT NO.:  [id number]   MEDICAL RECORD NO.:  [id number]                   PATIENT TYPE:  INP   LOCATION:  2008                                 FACILITY:  [facility]   PHYSICIAN:  Veneda Melter, M.D. [facility]               DATE OF BIRTH:  [birth year]   DATE OF ADMISSION:  09/08/2001  DATE OF DISCHARGE:  09/09/2001                           DISCHARGE SUMMARY - REFERRING   PROCEDURES:  1. Cardiac catheterization.  2. Coronary arteriogram.  3. Left ventriculogram.  4. Perclose of right femoral artery.   HISTORY OF PRESENT ILLNESS:  The patient is a 50 year old female with no  known history of coronary artery disease, who developed epigastric pain on  the day of admission that was associated with nausea, palpitations, and  diaphoresis.  She was admitted to rule out MI and for further evaluation.  It was felt that with her cardiac risk factors, which included diabetes,  hypertension, and a family history of coronary artery disease, a heart  catheterization was the best option.  The cardiac catheterization was  performed on September 09, 2001.   HOSPITAL COURSE:  The cardiac catheterization showed a 20-30% proximal LAD  and a 30% distal LAD.  The circumflex had a 30% proximal stenosis and the  RCA had no disease.  The EF was greater than 65% with no MR.  It was felt  that she had mild coronary artery disease with normal left ventricle  systolic function and cardiac risk factor reduction was indicated.   Because of the characteristics of the pain, it was felt that a chest CT was  indicated to rule out a pulmonary embolus as the source of the pain.  Her  chest CT was negative and her LFTs were within normal limits.  Because her  cardiac catheterization showed no critical disease and chest CT was negative  for PE, she was considered stable for discharge on September 09, 2001, once she  was ambulatory without any difficulty.   LABORATORY DATA:  The chest  CT showed right infrahilar calcifications  associated with small nodular densities surrounding one of the right lower  lobe bronchi, question post inflammatory scarring versus mucus-impacted  bronchials.   Hemoglobin 13.3, hematocrit 39.4, WBC 9.8, platelets 402.  Sodium 136,  potassium 3.3, chloride 99, CO2 26, BUN 12, creatinine 0.7, glucose 100.  The urine pregnancy test prior to the catheterization was negative.  AST  slightly elevated at 58, ALT slightly elevated at 88.   CONDITION ON DISCHARGE:  Stable.   DISCHARGE DIAGNOSES:  1. Chest pain, no significant coronary artery disease by catheterization and     no pulmonary embolus by CT.  2. Noninsulin-dependent diabetes mellitus.  3. Hyperlipidemia.  4. Seasonal allergies.  5. Hypertension.  6. Gastrointestinal reflux disease symptoms.  7. History of sensitivity to theophylline.  8. Asthma.  9. Obesity.  10.  Family history of coronary artery disease.  11.      Sinus tachycardia.   ACTIVITY:  Her activity level is to include no heavy lifting for two days  and she can return to work on September 12, 2001.   FOLLOW UP:  She is to follow up with Veneda Melter, M.D., in four to six weeks  and with Lovenia Kim, D.O., in two weeks.   DISCHARGE MEDICATIONS:  She is to resume Glucophage on Monday morning and  resume all other home medications at discharge.     Lavella Hammock, P.A. [facility]                  Veneda Melter, M.D. [facility]    RG/MEDQ  D:  10/17/2001  T:  10/19/2001  Job:  [id number]   cc:   Lovenia Kim, D.O.

## 2010-06-13 NOTE — Cardiovascular Report (Signed)
NAME:  Alexa [name], Alexa [name]                          ACCOUNT NO.:  [id number]   MEDICAL RECORD NO.:  [id number]                   PATIENT TYPE:  INP   LOCATION:  2008                                 FACILITY:  [facility]   PHYSICIAN:  Veneda Melter, M.D. [facility]               DATE OF BIRTH:  05-09-1960   DATE OF PROCEDURE:  09/09/2001  DATE OF DISCHARGE:                              CARDIAC CATHETERIZATION   PROCEDURES PERFORMED:  1. Left heart catheterization.  2. Left ventriculogram.  3. Selective coronary angiography.  4. Perclose right femoral artery.   DIAGNOSES:  1. Mild coronary artery disease by angiogram.  2. Normal left ventricular systolic function.   HISTORY:  The patient is a 50 year old white female with diabetes mellitus,  hypertension and obesity, who presents with severe substernal chest  discomfort.  This is associated with tachycardia, diaphoresis, and the  patient was subsequently admitted to the hospital. She underwent chest CT  showing no evidence of pulmonary embolus.  Laboratory data was also  unrevealing. She ruled out for acute myocardial infarction and she presents  for further cardiac assessment.   TECHNIQUE:  Informed consent was obtained, patient brought to the cardiac  catheterization laboratory, and a 6 French sheath was placed in the right  femoral artery.  The 6 [place] and JR4 catheters were then used to engage  the left and right coronary arteries, and selective angiography performed in  various projections using manual injections of contrast. A 6 French pigtail  catheter was then advanced to the left ventricle and left ventriculogram  performed using power injections of contrast. At the termination of the  case, the catheters and sheaths were removed, and a Perclose suture closure  device deployed to the right femoral artery until adequate hemostasis was  achieved.  The patient tolerated the procedure well and was transferred to  the ward in stable  condition.   FINDINGS:  1. Left main trunk:  This is a small caliber vessel with mild     irregularities.  2. LAD: This is a medium caliber vessel that provides two small diagonal     branches in its proximal segment. The LAD has an ostial pinch of 20-30%.     There is mild irregularities of 30% in the distal vessel after the second     diagonal branch. The remainder of the LAD has luminal irregularities.  3. Left circumflex artery:  This is a medium caliber vessel that provides     four small marginal branches. There is an ostial narrowing of 30%.  The     remainder of the left circumflex system has luminal irregularities.  4. Right coronary artery:  Dominant. This is a medium caliber vessel that     provides a posterior descending artery and small posterior ventricular     branch in its terminal segment.  The right coronary artery has luminal  irregularities.   LEFT VENTRICULOGRAM:  Normal end-systolic and end-diastolic dimensions.  Overall, left ventricular function is well preserved.  Ejection fraction of  greater than 65%.  No mitral regurgitation.  LV pressure is 120/5, aortic is 120/70.  LVEDP equals 14.    ASSESSMENT AND PLAN:  The patient is a 50 year old female with mild coronary  artery disease by angiogram.  Continued medical therapy and aggressive risk  factor modification will be persued.                                                Veneda Melter, M.D. [facility]    NG/MEDQ  D:  09/09/2001  T:  09/13/2001  Job:  [id number]   cc:   Lovenia Kim, D.O.

## 2010-06-13 NOTE — Cardiovascular Report (Signed)
NAMEHENNY, Alexa [name] NO.:  [id number]   MEDICAL RECORD NO.:  [id number]          PATIENT TYPE:  INP   LOCATION:  3728                         FACILITY:  [facility]   PHYSICIAN:  Arturo Morton. Riley Kill, M.D. [facility] OF BIRTH:  Mar 04, 1960   DATE OF PROCEDURE:  06/26/2005  DATE OF DISCHARGE:  06/27/2005                              CARDIAC CATHETERIZATION   INDICATIONS:  Alexa [name] is a 50 year old who previously underwent  catheterization in 2003 by Dr. Chales Abrahams.  She has had some recurrent chest  discomfort.  The exact etiology of this is unclear.  She had a Cardiolite  done in October of 2006 which revealed normal myocardial perfusion and an  ejection fraction of 49%.  She also has hypertension and diabetes; and has  had a history of asthma.  The patient has also had potentially a workup for  pheochromocytoma.  She is brought to the lab for further evaluation of chest  pain.   PROCEDURE:  1.  Left heart catheterization.  2.  Selective coronary territory.  3.  Selective left ventriculography.   DESCRIPTION OF THE PROCEDURE:  The patient was brought to the  catheterization laboratory and prepped and draped in usual fashion.  Through  an anterior puncture the right femoral artery was easily entered; a 4-French  sheath was placed.  We then did views of the left and right coronaries in  multiple angiographic projections.  The central aortic and left ventricular  pressures were measured with a pigtail.  Ventriculography was performed in  the RAO projection.  She tolerated the procedure without complications; and  was taken to the holding area in satisfactory clinical condition.   HEMODYNAMIC DATA:  1.  Central aortic pressure 129/81.  2.  Left ventricular pressure 159/30.  3.  No gradient or pullback across aortic valve.   ANGIOGRAPHIC DATA:  1.  The left main coronary artery is free of critical disease.  2.  The left anterior descending artery demonstrates minimal pinching  at the      ostium of 20-30% at most.  After the origin of two diagonal branches,      there is an area of about 30-40% narrowing which is really fairly      unchanged from previous study.  The distal LAD is without critical      narrowing.  The diagonals have minimal luminal irregularity but are      without significant focal stenosis.  3.  The circumflex provides an insignificant first marginal branch then      provides a large second marginal branch; and a posterolateral branch.      There is perhaps minimal plaquing in the continuation after the large      marginal takeoff, but no critical stenoses.  4.  The right coronary artery is a large-caliber vessel.  It provides an      acute marginal which supplies the distal portion of the inferior wall;      and a smaller PDA posterolateral system.  Other than minimal      irregularity in the posterior descending and posterolateral branch,  the      right coronary is free of critical disease.  5.  Ventriculography in the RAO projection is under opacified.  There does      appear to potentially be some mild left ventricular hypertrophy,but this      is difficult to be certain.  Global systolic function is vigorous.   CONCLUSION:  1.  Preserved overall left ventricular function.  2.  A 20-30% narrowing of the left anterior descending artery ostially and      30-40% mid stenosis.   DISPOSITION:  Further evaluation will be at the discretion Dr. Myrtis [name].  She  apparently has had some previous workup including ultrasound of the renal  arteries, as well as evaluation for pheochromocytoma.  We will need to get  the results of those previous evaluations.      Arturo Morton. Riley Kill, M.D. [facility]  Electronically Signed     TDS/MEDQ  D:  06/26/2005  T:  06/27/2005  Job:  [id number]   cc:   Alexa [name], M.D.  [address]  [address]  [place]  [place] [address]   Cardiovascular Laboratory

## 2010-06-13 NOTE — Discharge Summary (Signed)
Alexa [name], Alexa [name] NO.:  [id number]   MEDICAL RECORD NO.:  [id number]          PATIENT TYPE:  INP   LOCATION:  3728                         FACILITY:  [facility]   PHYSICIAN:  Rollene Rotunda, M.D.   DATE OF BIRTH:  03-Feb-1960   DATE OF ADMISSION:  06/25/2005  DATE OF DISCHARGE:  06/27/2005                                 DISCHARGE SUMMARY   PRIMARY CARDIOLOGIST:  Dr. Willa Rough.   PRIMARY CARE PHYSICIAN:  Dr. Lenord Fellers.   PRINCIPAL DIAGNOSIS:  Chest pain.   OTHER DIAGNOSES:  1.  Hypertension.  2.  Hyperlipidemia.  3.  Type 2 diabetes mellitus.  4.  Morbid obesity.  5.  Gastroesophageal reflux disease.  6.  Asthma.   ALLERGIES:  THEOPHYLLINE.   PROCEDURES:  Left heart cardiac catheterization.   HISTORY OF PRESENT ILLNESS:  A 50 year old white female who works as a Engineer, civil (consulting)  at [employer] with a history of atypical chest pain, status post  normal cardiac catheterization in 2003.  She presented to the [facility] ED  on Jun 25, 2005 with a 4-day history of substernal chest pain with left arm  radiation associated with shortness of breath and relieved with rest.  The  decision was made to admit her for further evaluation.   HOSPITAL COURSE:  Ms. Frick ruled out for MI by cardiac markers times three.  She underwent left heart cardiac catheterization on June 1, which revealed  nonobstructive coronary artery disease with normal LV function.  She has not  had any recurrent chest pain since hospitalization and therefore is being  discharged home today in satisfactory condition.   Secondary to complaints of tachycardia and elevated blood pressure with  ambulation, we have arranged for 24-hour urine for catecholamines,  metanephrine and VMA as an outpatient.  She will follow up with Dr. Myrtis Ser in  approximately 2 weeks.   DISCHARGE LABS:  Hemoglobin 12.1, hematocrit 35.8, WBC 7.6, platelets 294.  Sodium 135, potassium 4.4, chloride 107, CO2 26, BUN 6,  creatinine 0.9,  glucose 156, calcium 8.4, total bilirubin 0.6, alkaline phosphatase 66, AST  22, ALT 25, total protein 6.9, albumin 4, magnesium 2.2.  Total cholesterol  137, triglycerides 58, HDL 60, LDL 65.  TSH 4.809.  Urine HCG was negative.   DISPOSITION:  The patient is being discharged home in good condition.   FOLLOW-UP PLANS AND APPOINTMENTS:  She is asked to follow up with her  primary care physician, Dr. Lenord Fellers, in approximately 1-2 weeks.  She will  follow with Dr. Myrtis Ser in approximately 2 weeks.  She will be contacted  regarding followup for a 24- urine by our office.   DISCHARGE MEDICATIONS:  1.  Accupril 40 mg daily.  2.  Glucotrol 20 mg daily.  3.  NovoLog sliding scale a.c.  4.  Aspirin 81 mg daily.  5.  Lipitor 20 mg q.h.s.  6.  Norvasc 10 mg daily.  7.  Lasix 20 mg daily.  8.  K-Dur as previously prescribed.   OUTSTANDING LAB STUDIES:  None.   DURATION OF DISCHARGE ENCOUNTER:  35  minutes including physician time.      Ok Anis, NP    ______________________________  Rollene Rotunda, M.D.    CRB/MEDQ  D:  06/27/2005  T:  06/27/2005  Job:  [id number]   cc:   Luanna Cole. Lenord Fellers, M.D.  Fax: [phone]

## 2010-08-22 ENCOUNTER — Emergency Department (HOSPITAL_COMMUNITY)
Admission: EM | Admit: 2010-08-22 | Discharge: 2010-08-22 | Disposition: A | Discharge status: Home / Self Care | Payer: Commercial Managed Care - PPO | Attending: Emergency Medicine | Admitting: Emergency Medicine

## 2010-08-22 DIAGNOSIS — W57XXXA Bitten or stung by nonvenomous insect and other nonvenomous arthropods, initial encounter: Secondary | ICD-10-CM | POA: Insufficient documentation

## 2010-08-22 DIAGNOSIS — E119 Type 2 diabetes mellitus without complications: Secondary | ICD-10-CM | POA: Insufficient documentation

## 2010-08-22 DIAGNOSIS — T148 Other injury of unspecified body region: Secondary | ICD-10-CM | POA: Insufficient documentation

## 2010-08-22 DIAGNOSIS — IMO0001 Reserved for inherently not codable concepts without codable children: Secondary | ICD-10-CM | POA: Insufficient documentation

## 2010-08-22 DIAGNOSIS — R509 Fever, unspecified: Secondary | ICD-10-CM | POA: Insufficient documentation

## 2010-08-22 LAB — URINALYSIS, ROUTINE W REFLEX MICROSCOPIC
Leukocytes, UA: NEGATIVE
Specific Gravity, Urine: 1.016 (ref 1.005–1.030)
Urobilinogen, UA: 1 mg/dL (ref 0.0–1.0)
pH: 8 (ref 5.0–8.0)

## 2010-08-22 LAB — DIFFERENTIAL
Basophils Absolute: 0 10*3/uL (ref 0.0–0.1)
Basophils Relative: 0 % (ref 0–1)
Eosinophils Absolute: 0.1 10*3/uL (ref 0.0–0.7)
Lymphocytes Relative: 16 % (ref 12–46)
Monocytes Absolute: 0.7 10*3/uL (ref 0.1–1.0)
Monocytes Relative: 7 % (ref 3–12)
Neutro Abs: 8.4 10*3/uL — ABNORMAL HIGH (ref 1.7–7.7)

## 2010-08-22 LAB — COMPREHENSIVE METABOLIC PANEL
ALT: 14 U/L (ref 0–35)
Alkaline Phosphatase: 98 U/L (ref 39–117)
BUN: 14 mg/dL (ref 6–23)
CO2: 24 mEq/L (ref 19–32)
Calcium: 8.8 mg/dL (ref 8.4–10.5)
Creatinine, Ser: 1 mg/dL (ref 0.50–1.10)
Potassium: 3.6 mEq/L (ref 3.5–5.1)
Total Bilirubin: 0.2 mg/dL — ABNORMAL LOW (ref 0.3–1.2)
Total Protein: 6.9 g/dL (ref 6.0–8.3)

## 2010-08-22 LAB — GLUCOSE, CAPILLARY: Glucose-Capillary: 161 mg/dL — ABNORMAL HIGH (ref 70–99)

## 2010-09-24 ENCOUNTER — Ambulatory Visit (INDEPENDENT_AMBULATORY_CARE_PROVIDER_SITE_OTHER): Payer: Commercial Managed Care - PPO | Admitting: "Endocrinology

## 2010-09-24 VITALS — BP 137/87 | HR 81 | Wt 191.3 lb

## 2010-09-24 DIAGNOSIS — I10 Essential (primary) hypertension: Secondary | ICD-10-CM

## 2010-09-24 DIAGNOSIS — G909 Disorder of the autonomic nervous system, unspecified: Secondary | ICD-10-CM

## 2010-09-24 DIAGNOSIS — R000 Tachycardia, unspecified: Secondary | ICD-10-CM

## 2010-09-24 DIAGNOSIS — E1149 Type 2 diabetes mellitus with other diabetic neurological complication: Secondary | ICD-10-CM

## 2010-09-24 DIAGNOSIS — E1143 Type 2 diabetes mellitus with diabetic autonomic (poly)neuropathy: Secondary | ICD-10-CM

## 2010-09-24 DIAGNOSIS — E669 Obesity, unspecified: Secondary | ICD-10-CM

## 2010-09-24 DIAGNOSIS — IMO0001 Reserved for inherently not codable concepts without codable children: Secondary | ICD-10-CM

## 2010-09-24 DIAGNOSIS — E559 Vitamin D deficiency, unspecified: Secondary | ICD-10-CM

## 2010-09-24 DIAGNOSIS — E211 Secondary hyperparathyroidism, not elsewhere classified: Secondary | ICD-10-CM

## 2010-09-24 LAB — POCT GLYCOSYLATED HEMOGLOBIN (HGB A1C): Hemoglobin A1C: 7.4

## 2010-09-24 LAB — GLUCOSE, POCT (MANUAL RESULT ENTRY): POC Glucose: 120

## 2010-09-24 NOTE — Patient Instructions (Signed)
Followup visit in 3 months. Please try to follow the eat right diet pattern. Please try to exercise at least 5 times per week.

## 2010-09-25 LAB — PTH, INTACT AND CALCIUM: PTH: 105.3 pg/mL — ABNORMAL HIGH (ref 14.0–72.0)

## 2010-11-04 LAB — DIFFERENTIAL
Basophils Absolute: 0
Basophils Relative: 1
Eosinophils Absolute: 0 — ABNORMAL LOW
Eosinophils Absolute: 0 — ABNORMAL LOW
Eosinophils Relative: 0
Eosinophils Relative: 0
Lymphocytes Relative: 33
Lymphs Abs: 2
Monocytes Absolute: 0.7
Monocytes Absolute: 1.1 — ABNORMAL HIGH
Monocytes Relative: 9
Neutrophils Relative %: 75

## 2010-11-04 LAB — URINALYSIS, ROUTINE W REFLEX MICROSCOPIC
Hgb urine dipstick: NEGATIVE
Protein, ur: NEGATIVE
Urobilinogen, UA: 1

## 2010-11-04 LAB — CBC
HCT: 31.5 — ABNORMAL LOW
HCT: 34.5 — ABNORMAL LOW
Hemoglobin: 10.8 — ABNORMAL LOW
Hemoglobin: 12
MCHC: 34.3
MCHC: 34.6
MCHC: 34.9
MCV: 83.1
MCV: 83.2
Platelets: 267
Platelets: 297
RBC: 4.15
RBC: 4.98
RDW: 13.7
WBC: 12.6 — ABNORMAL HIGH

## 2010-11-04 LAB — BASIC METABOLIC PANEL
CO2: 24
Calcium: 9.5
Creatinine, Ser: 0.8
GFR calc Af Amer: >60

## 2010-11-04 LAB — PREGNANCY, URINE: Preg Test, Ur: NEGATIVE

## 2010-11-04 LAB — PTH, INTACT AND CALCIUM
Calcium, Total (PTH): 9.5
PTH: 38.4

## 2010-11-05 ENCOUNTER — Encounter: Payer: Self-pay | Admitting: Internal Medicine

## 2010-11-10 ENCOUNTER — Ambulatory Visit (INDEPENDENT_AMBULATORY_CARE_PROVIDER_SITE_OTHER): Payer: 59 | Admitting: Internal Medicine

## 2010-11-10 ENCOUNTER — Encounter: Payer: Self-pay | Admitting: Internal Medicine

## 2010-11-10 DIAGNOSIS — Z0000 Encounter for general adult medical examination without abnormal findings: Secondary | ICD-10-CM

## 2010-11-10 DIAGNOSIS — N912 Amenorrhea, unspecified: Secondary | ICD-10-CM

## 2010-11-10 DIAGNOSIS — E785 Hyperlipidemia, unspecified: Secondary | ICD-10-CM

## 2010-11-10 DIAGNOSIS — R5383 Other fatigue: Secondary | ICD-10-CM

## 2010-11-10 DIAGNOSIS — D649 Anemia, unspecified: Secondary | ICD-10-CM

## 2010-11-10 DIAGNOSIS — R5381 Other malaise: Secondary | ICD-10-CM

## 2010-11-11 LAB — IRON AND TIBC
%SAT: 6 % — ABNORMAL LOW (ref 20–55)
TIBC: 406 ug/dL (ref 250–470)
UIBC: 380 ug/dL (ref 125–400)

## 2010-11-11 LAB — VITAMIN B12: Vitamin B-12: 336 pg/mL (ref 211–911)

## 2010-11-11 LAB — FOLATE: Folate: >20 ng/mL

## 2010-12-01 NOTE — Patient Instructions (Signed)
Continue current medications and continue followup with endocrinologist. Return in 6 months

## 2010-12-01 NOTE — Progress Notes (Signed)
  Subjective:    Patient ID: Alexa [name], female    DOB: 1960-03-27, 50 y.o.   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 Prior to that she had hypertension, diabetes mellitus which was not well controlled do to noncompliance, GE reflux, coronary artery disease. Patient has history of asthma. Patient had cardiac catheterization August 2003 showing nonobstructive coronary artery disease. Also was hospitalized 2007 and had another catheterization showing minor nonobstructive coronary disease and normal left ventricular function. Patient doing well on current medications. No problems with bypass surgery. 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    Review of Systems  Constitutional: Negative.   HENT: Negative.   Eyes: Negative.   Respiratory:       History of asthma  Cardiovascular: Negative for palpitations and leg swelling.       Hospitalized for chest pain 2007  Genitourinary: Negative.   Musculoskeletal: Positive for back pain.  Neurological: Negative.   Hematological: Negative.   Psychiatric/Behavioral: Negative.        Objective:   Physical Exam  Constitutional: She is oriented to person, place, and time. She appears well-developed and well-nourished. No distress.  HENT:  Head: Normocephalic and atraumatic.  Right Ear: External ear normal.  Left Ear: External ear normal.  Mouth/Throat: Oropharynx is clear and moist.  Eyes: Conjunctivae and EOM are normal. Pupils are equal, round, and reactive to light. No scleral icterus.  Neck: Neck supple. No JVD present. No thyromegaly present.  Cardiovascular: Normal rate, regular rhythm, normal heart sounds and intact distal pulses.   Pulmonary/Chest: Effort normal and breath sounds normal. She has no wheezes. She has no rales.       Breasts normal  female  Abdominal: Soft. Bowel sounds are normal. She exhibits no mass. There is no tenderness. There is no rebound.  Genitourinary:       Pap done 2010  Musculoskeletal: Normal range of motion.       Diabetic foot exam negative  Lymphadenopathy:    She has no cervical adenopathy.  Neurological: She is alert and oriented to person, place, and time. She has normal reflexes. No cranial nerve deficit. Coordination normal.  Skin: Skin is warm and dry. She is not diaphoretic.  Psychiatric: Her behavior is normal.          Assessment & Plan:  History of obesity status post gastric bypass surgery 2008  History of diabetes mellitus  History of hypertension  History of GE reflux  History of nonobstructive coronary artery disease  History of asthma  Plan: Return in 6 months for hemoglobin A1c, blood pressure check and further evaluation

## 2010-12-25 ENCOUNTER — Ambulatory Visit: Payer: Commercial Managed Care - PPO | Admitting: "Endocrinology

## 2011-01-15 ENCOUNTER — Ambulatory Visit: Payer: 59 | Admitting: Internal Medicine

## 2011-02-06 ENCOUNTER — Ambulatory Visit: Payer: 59 | Admitting: Internal Medicine

## 2011-03-04 ENCOUNTER — Encounter: Payer: Self-pay | Admitting: "Endocrinology

## 2011-03-04 DIAGNOSIS — E211 Secondary hyperparathyroidism, not elsewhere classified: Secondary | ICD-10-CM | POA: Insufficient documentation

## 2011-03-04 DIAGNOSIS — E559 Vitamin D deficiency, unspecified: Secondary | ICD-10-CM | POA: Insufficient documentation

## 2011-03-04 DIAGNOSIS — K140 Glossitis: Secondary | ICD-10-CM | POA: Insufficient documentation

## 2011-03-04 DIAGNOSIS — R000 Tachycardia, unspecified: Secondary | ICD-10-CM | POA: Insufficient documentation

## 2011-03-04 DIAGNOSIS — E063 Autoimmune thyroiditis: Secondary | ICD-10-CM | POA: Insufficient documentation

## 2011-03-04 DIAGNOSIS — D509 Iron deficiency anemia, unspecified: Secondary | ICD-10-CM | POA: Insufficient documentation

## 2011-03-04 DIAGNOSIS — F32A Depression, unspecified: Secondary | ICD-10-CM | POA: Insufficient documentation

## 2011-03-04 DIAGNOSIS — J45909 Unspecified asthma, uncomplicated: Secondary | ICD-10-CM | POA: Insufficient documentation

## 2011-03-04 DIAGNOSIS — E7849 Other hyperlipidemia: Secondary | ICD-10-CM | POA: Insufficient documentation

## 2011-03-04 DIAGNOSIS — K219 Gastro-esophageal reflux disease without esophagitis: Secondary | ICD-10-CM | POA: Insufficient documentation

## 2011-03-04 DIAGNOSIS — E782 Mixed hyperlipidemia: Secondary | ICD-10-CM | POA: Insufficient documentation

## 2011-03-04 DIAGNOSIS — E1142 Type 2 diabetes mellitus with diabetic polyneuropathy: Secondary | ICD-10-CM | POA: Insufficient documentation

## 2011-03-04 DIAGNOSIS — E049 Nontoxic goiter, unspecified: Secondary | ICD-10-CM | POA: Insufficient documentation

## 2011-03-04 DIAGNOSIS — R231 Pallor: Secondary | ICD-10-CM | POA: Insufficient documentation

## 2011-03-04 DIAGNOSIS — R1013 Epigastric pain: Secondary | ICD-10-CM | POA: Insufficient documentation

## 2011-03-04 DIAGNOSIS — E66813 Obesity, class 3: Secondary | ICD-10-CM | POA: Insufficient documentation

## 2011-03-04 DIAGNOSIS — R5383 Other fatigue: Secondary | ICD-10-CM | POA: Insufficient documentation

## 2011-03-04 DIAGNOSIS — Z794 Long term (current) use of insulin: Secondary | ICD-10-CM | POA: Insufficient documentation

## 2011-03-04 DIAGNOSIS — F329 Major depressive disorder, single episode, unspecified: Secondary | ICD-10-CM | POA: Insufficient documentation

## 2011-03-04 DIAGNOSIS — E1140 Type 2 diabetes mellitus with diabetic neuropathy, unspecified: Secondary | ICD-10-CM | POA: Insufficient documentation

## 2011-03-04 DIAGNOSIS — E1143 Type 2 diabetes mellitus with diabetic autonomic (poly)neuropathy: Secondary | ICD-10-CM | POA: Insufficient documentation

## 2011-03-04 NOTE — Progress Notes (Signed)
Subjective:  Patient Name: Alexa [name] Date of Birth: 01-Feb-1960  MRN: [id number]  Alexa [name]  presents to the office today for follow-up of her type 2 diabetes mellitus, obesity, combined hyperlipidemia, GERD, hypertension, dyspepsia, pedal edema, nonproliferative diabetic retinopathy, goiter, depression, autonomic neuropathy, peripheral neuropathy, vitamin D deficiency, secondary hyperparathyroidism, tachycardia, glossitis, pallor, fatigue, iron deficiency anemia, and status post Roux-en-Y gastric bypass.  HISTORY OF PRESENT ILLNESS:   Alexa [name] is a 51 y.o. Caucasian woman. Alexa [name] was unaccompanied.   1. The patient first presented to me on 08/20/04 in referral from her primary care internist, Dr. Sharlet Salina, for evaluation and management of type 2 diabetes, obesity, and multiple medical issues. She was 51 years old.   A. Patient had a long history obesity. She was heavy as a child. She underwent menarche at age 55. At age 43 she was 180 pounds. In 1996 she weighed 218 pounds. In 1999 she was diagnosed with type 2 diabetes mellitus. Her weight at that point was 250 pounds. She was treated with Glucophage, and Actos. Actos made her gain weight. She has also been treated with glipizide in the past. More recently she been treated with Lantus and Glucophage plus regular insulin as needed when she took steroids for asthma attacks. Maximum weight had been 296 one month prior to this visit. Her tendency to gain weight and her difficulty in losing weight were aggravated by long-standing, intermittent depression by severe, recurrent asthma requiring the use of steroid medications.  B. Past medical history was also positive for a 60% blockage in one of her coronary arteries. She had significant issues with GERD and dyspepsia. She also had combined hyperlipidemia. She had a previous cardiac catheterization and previous tonsillectomy. She was allergic to theophylline. Her pertinent review of systems was  positive for some numbness and tingling in her feet. Family history was positive for type 2 diabetes in her father and her maternal grandmother. Her brother weighed 250 pounds at a height of 76 inches. Both her mother and her sister were hypothyroid.  C. on physical examination, her weight was 279.9 pounds. Her BMI was 49.6. Her blood pressure was 142/86. Her heart rate was 96. Her hemoglobin A1c was 9.8%. She was alert and oriented x3. Her affect was normal. Her insight was fair. She she was obviously quite heavy. She had a 25 g thyroid gland. She had 1+ tremor of her hands. She had 1+ DP pulses and 2+ tinea pedis. Sensation sensation to touch was intact in her feet. Laboratory data included a normal CMP. Her cholesterol was 175, triglycerides 76, HDL 53, and LDL 107. Her TSH was 2.98. Since she obviously did have type 2 diabetes mellitus associated with morbid obesity, and since weight loss was a major factor for her, I asked her to resume her metformin twice daily. I also started her on Byetta, initially 5 mcg twice daily and then 10 mcg twice daily. Discontinue her Lantus insulin. 2. During the last 6 years,we have had some major successes and some new problem areas.  A. T2DM: October 2006 the combination of Byetta and metformin was causing more gastrointestinal problems. Patient opted to stop the metformin and continue the Byetta because it was helping her with weight and blood sugar control. By 08/11/06 her weight had decreased to 267.6 pounds. Hemoglobin A1c was 8.6%. At that point she decided to have bariatric surgery. She had a Roux-en-Y gastric bypass on 12/20/2006. She subsequently lost weight down to 173.4 pounds on 05/23/08,  but then subsequently regained weight to the 190s. Her  hemoglobin A1c values varied in parallel with her weight. On 05/23/08, at the point of her lowest weight, her hemoglobin A1c dropped to a nadir of 6.3%.  Since then the hemoglobin A1c values have varied between 6.6 and 7.6%.    Although we were initially able to stop all of her diabetes medicines after surgery, when she began to regain weight we restarted metformin, 500 mg twice daily. The patient takes the majority of doses she is supposed to.   B. Just before her gastric bypass, I obtained baseline vitamin D studies. Her 25-hydroxy vitamin D was 7. Her calcium was 9.5. Her parathyroid hormone was 38.4. Subsequent to her surgery, the patient was supposed to be taking multivitamins with calcium and vitamin D, but did not always do so. 03/29/2007 her 25-hydroxy vitamin D was 29(normal greater than or equal to 30., but her calcium decreased to 8.7. Her PTH was slightly elevated 73.5 (normal 14-72). Her 1,25-hydroxy vitamin D was 46. Her iron was 56. I asked her to make sure she took her multivitamins and calcium daily. Unfortunately, she has not always been compliant with her multivitamins and calcium. Her 25-hydroxy vitamin D values have varied between 19-27. Her calcium values have varied between 8.1-9.4. Her PTH values have remained elevated between 82.9-167. The patient's iron levels have also been low, between 32-34. She is supposed to be taking iron every day as well.  C. The patient's last [facility] visit was on 06/09/10. In the interim, she had a recent episode of flu. She sometimes misses her calcium and vitamin D. She  also sometimes misses her metformin. She is on Nexium once per day. She has not been checking blood sugars regularly. 3. Pertinent Review of Systems:  Constitutional: The patient feels fairly well. She lost 8 pounds, but then put it back on. She is eating better again. She'll also started back to swimming recently Eyes: Vision is good. There are no significant eye complaints. She is due for her annual eye examination in September. Neck: The patient has no complaints of anterior neck swelling, soreness, tenderness,  pressure, discomfort, or difficulty swallowing.  Heart: Heart rate increases with exercise or other  physical activity. The patient has no complaints of palpitations, irregular heat beats, chest pain, or chest pressure. Gastrointestinal: She has occasional difficulty swallowing. She sometimes feels something uncomfortable in her left upper quadrant. Abdomen has not been tender to palpation. Bowel movents seem normal. The patient has no complaints of excessive hunger, acid reflux, upset stomach, stomach aches or pains, diarrhea, or constipation. Legs: Muscle mass and strength seem normal. There are no complaints of numbness, tingling, burning, or pain. No edema is noted. Feet: There are no obvious foot problems. There are no complaints of numbness, tingling, burning, or pain. No edema is noted. Hypoglycemia: None 4. BG report: She occasionally has a blood glucose in excess of 200.   PAST MEDICAL, FAMILY, AND SOCIAL HISTORY:  Past Medical History  Diagnosis Date  . Diabetes mellitus   . Asthma   . Hypertension   . GERD (gastroesophageal reflux disease)   . CAD (coronary artery disease)   . Elevated homocysteine   . Chronic insomnia   . Diabetes mellitus type II   . Obesity, Class III, BMI 40-49.9 (morbid obesity)   . Combined hyperlipidemia   . Asthma, chronic   . GERD (gastroesophageal reflux disease)   . Dyspepsia   . Nonproliferative diabetic retinopathy associated with type  2 diabetes mellitus   . Goiter   . Depression   . Diabetic autonomic neuropathy   . DM type 2 with diabetic peripheral neuropathy   . Vitamin d deficiency   . Hyperparathyroidism , secondary, non-renal   . Tachycardia   . Glossitis   . Thyroiditis, autoimmune   . Fatigue   . Pallor   . Anemia, iron deficiency     Family History  Problem Relation Age of Onset  . Thyroid disease Mother     Hypothyroid  . Diabetes Father     T2 DM  . Obesity Brother     250 pounds at a height of 76 inches.  . Thyroid disease Sister     Hypothyroid  . Diabetes Maternal Grandmother     Current outpatient  prescriptions:Calcium Carbonate-Vitamin D (CALCIUM-VITAMIN D) 500-200 MG-UNIT per tablet, Take 1 tablet by mouth 2 (two) times daily with a meal.  , Disp: , Rfl: ;  esomeprazole (NEXIUM) 40 MG capsule, Take 40 mg by mouth daily before breakfast.  , Disp: , Rfl: ;  metFORMIN (GLUCOPHAGE) 500 MG tablet, Take 500 mg by mouth 2 (two) times daily with a meal.  , Disp: , Rfl:  Multiple Vitamin (MULTIVITAMIN) tablet, Take 1 tablet by mouth daily.  , Disp: , Rfl: ;  telmisartan (MICARDIS) 40 MG tablet, Take 40 mg by mouth daily.  , Disp: , Rfl: ;  ALBUTEROL IN, Inhale into the lungs.  , Disp: , Rfl: ;  cyclobenzaprine (FLEXERIL) 10 MG tablet, Take 10 mg by mouth once.  , Disp: , Rfl: ;  HYDROcodone-acetaminophen (VICODIN) 5-500 MG per tablet, Take 1 tablet by mouth every 6 (six) hours as needed.  , Disp: , Rfl:  TRYPTOPHAN PO, Take by mouth.  , Disp: , Rfl:   Allergies as of 09/24/2010 - Review Complete 09/24/2010  Allergen Reaction Noted  . Theophyllines Palpitations 05/13/2010    1. 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  2. Activities: She has been swimming for the past 2 weeks. 3. Smoking, alcohol, or drugs: None 4. Primary Care Provider: Dr. Eden Emms Baxley  ROS: There are no other significant problems involving Terese's other body systems.   Objective:  Vital Signs:  BP 137/87  Pulse 81  Wt 191 lb 4.8 oz (86.773 kg)   Ht Readings from Last 3 Encounters:  11/10/10 5\' 4"  (1.626 m)   Wt Readings from Last 3 Encounters:  11/10/10 195 lb (88.451 kg)  09/24/10 191 lb 4.8 oz (86.773 kg)  06/09/10 191 lb (86.637 kg)   PHYSICAL EXAM:  Constitutional: The patient appears healthy and well nourished.  Face: The face appears normal.  Eyes: There is no obvious arcus or proptosis. Moisture appears normal. Mouth: The oropharynx and tongue appear normal. Dentition appears to be normal for age. Oral moisture is normal. Neck: The neck  appears to be visibly normal. No carotid bruits are noted. The thyroid gland is 20+ grams in size. The consistency of the thyroid gland is normal. The thyroid gland is not tender to palpation. Lungs: The lungs are clear to auscultation. Air movement is good. Heart: Heart rate and rhythm are regular. Heart sounds S1 and S2 are normal. I did not appreciate any pathologic cardiac murmurs. Abdomen: The abdomen is enlarged. Bowel sounds are normal. There is no obvious hepatomegaly, splenomegaly, or other mass effect.  Arms: Muscle size and bulk are normal for age. Hands: There  is no obvious tremor. Phalangeal and metacarpophalangeal joints are normal. Palmar muscles are normal. Palmar skin is normal. Palmar moisture is also normal. Legs: Muscles appear normal for age. No edema is present. Feet: Feet are normally formed. Dorsalis pedal pulses are normal 1+ bilaterally. Neurologic: Strength is normal for age in both the upper and lower extremities. Muscle tone is normal. Sensation to touch is normal in both the legs and feet.    LAB DATA: Hemoglobin A1c today was 7.4%. This is a major increased from 6.6% in May. This is also her highest hemoglobin A1c since her bariatric surgery.          Labs 06/09/10: Cholesterol was 175, triglycerides 76, HDL 62, LDL 98. TSH was 1.217. Free T4 was 1.10. Free T3 was 2.7. 25-hydroxy vitamin D was 19. PTH was 167.   Assessment and Plan:   ASSESSMENT:  1. Type 2 diabetes mellitus. Her blood glucose control is worse. There have been a lot of noncompliance issues. 2. Hypoglycemia: None 3. Obesity: There has been no real change in the patient's weight since last visit. 4. Autonomic neuropathy and tachycardia: The patient's heart rate has improved, consistent with improvements in hemoglobin A1c in the preceding 3-6 months. If the blood sugars continue to remain high, however, then the autonomic neuropathy and tachycardia are likely to worsen in the near future. 5. Goiter:  The patient was euthyroid in May. 6. Vitamin D deficiency, relative hypocalcemia, and secondary hyperparathyroidism: Her May value for PTH was the highest that we've seen. Since the last visit she has been more compliant with taking her calcium and vitamin D, so hopefully we will see improvements. 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 Patients such as Ms. Dhami have to work harder to take in all of her multivitamins, calcium, and vitamin D. 7. Hypertension: She needs to take her antihypertensive medication daily. She also needs to try to get regular exercise.  PLAN:  1. Diagnostic: Vitamin D and PTH in calcium 2. Therapeutic: Be as compliant as possible with all of her medications and supplements. Try to fit in exercise daily. 3. 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 She must take her medications and supplements. 4. Follow-up: Return in about 3 days (around 09/27/2010).  Level of Service: This visit lasted in excess of 40 minutes. More than 50% of the visit was devoted to counseling.   David Stall, MD 03/04/2011 11:29 AM

## 2011-03-16 ENCOUNTER — Other Ambulatory Visit: Payer: Self-pay | Admitting: Internal Medicine

## 2011-03-16 DIAGNOSIS — Z1231 Encounter for screening mammogram for malignant neoplasm of breast: Secondary | ICD-10-CM

## 2011-04-09 ENCOUNTER — Other Ambulatory Visit: Payer: Self-pay | Admitting: *Deleted

## 2011-04-09 DIAGNOSIS — R1013 Epigastric pain: Secondary | ICD-10-CM

## 2011-04-09 DIAGNOSIS — I10 Essential (primary) hypertension: Secondary | ICD-10-CM

## 2011-04-09 DIAGNOSIS — E119 Type 2 diabetes mellitus without complications: Secondary | ICD-10-CM

## 2011-04-09 MED ORDER — METFORMIN HCL 500 MG PO TABS: 500.0000 mg | ORAL_TABLET | Freq: Two times a day (BID) | ORAL | Status: DC

## 2011-04-09 MED ORDER — ESOMEPRAZOLE MAGNESIUM 40 MG PO CPDR: 40.0000 mg | DELAYED_RELEASE_CAPSULE | Freq: Every day | ORAL | Status: DC

## 2011-04-09 MED ORDER — TELMISARTAN 40 MG PO TABS: 40.0000 mg | ORAL_TABLET | Freq: Every day | ORAL | Status: DC

## 2011-05-13 ENCOUNTER — Ambulatory Visit (HOSPITAL_COMMUNITY)
Admission: RE | Admit: 2011-05-13 | Discharge: 2011-05-13 | Disposition: A | Discharge status: Home / Self Care | Payer: 59 | Source: Ambulatory Visit | Attending: Internal Medicine | Admitting: Internal Medicine

## 2011-05-13 DIAGNOSIS — Z1231 Encounter for screening mammogram for malignant neoplasm of breast: Secondary | ICD-10-CM

## 2011-05-15 ENCOUNTER — Other Ambulatory Visit: Payer: Self-pay | Admitting: Internal Medicine

## 2011-05-15 DIAGNOSIS — R928 Other abnormal and inconclusive findings on diagnostic imaging of breast: Secondary | ICD-10-CM

## 2011-05-18 ENCOUNTER — Ambulatory Visit
Admission: RE | Admit: 2011-05-18 | Discharge: 2011-05-18 | Disposition: A | Discharge status: Home / Self Care | Payer: 59 | Source: Ambulatory Visit | Attending: Internal Medicine | Admitting: Internal Medicine

## 2011-05-18 ENCOUNTER — Other Ambulatory Visit: Payer: 59

## 2011-05-18 DIAGNOSIS — R928 Other abnormal and inconclusive findings on diagnostic imaging of breast: Secondary | ICD-10-CM

## 2011-05-20 ENCOUNTER — Other Ambulatory Visit: Payer: 59

## 2011-08-11 ENCOUNTER — Other Ambulatory Visit: Payer: Self-pay

## 2011-08-11 MED ORDER — CYCLOBENZAPRINE HCL 10 MG PO TABS: 10.0000 mg | ORAL_TABLET | Freq: Every evening | ORAL | Status: DC | PRN

## 2011-08-13 ENCOUNTER — Telehealth: Payer: Self-pay | Admitting: Internal Medicine

## 2011-08-13 MED ORDER — CYCLOBENZAPRINE HCL 10 MG PO TABS: 10.0000 mg | ORAL_TABLET | Freq: Three times a day (TID) | ORAL | Status: DC | PRN

## 2011-08-13 NOTE — Telephone Encounter (Signed)
Change in Rx, per patient's request.

## 2011-08-13 NOTE — Telephone Encounter (Signed)
Rx written Flexeril 10mg  #270 one po tid with one refill. Fax to [facility]

## 2011-08-14 ENCOUNTER — Encounter: Payer: Self-pay | Admitting: "Endocrinology

## 2011-08-14 ENCOUNTER — Ambulatory Visit (INDEPENDENT_AMBULATORY_CARE_PROVIDER_SITE_OTHER): Payer: 59 | Admitting: "Endocrinology

## 2011-08-14 VITALS — BP 159/103 | HR 86 | Wt 198.8 lb

## 2011-08-14 DIAGNOSIS — R1013 Epigastric pain: Secondary | ICD-10-CM

## 2011-08-14 DIAGNOSIS — E1149 Type 2 diabetes mellitus with other diabetic neurological complication: Secondary | ICD-10-CM

## 2011-08-14 DIAGNOSIS — E559 Vitamin D deficiency, unspecified: Secondary | ICD-10-CM

## 2011-08-14 DIAGNOSIS — G909 Disorder of the autonomic nervous system, unspecified: Secondary | ICD-10-CM

## 2011-08-14 DIAGNOSIS — Z72820 Sleep deprivation: Secondary | ICD-10-CM

## 2011-08-14 DIAGNOSIS — E1143 Type 2 diabetes mellitus with diabetic autonomic (poly)neuropathy: Secondary | ICD-10-CM

## 2011-08-14 DIAGNOSIS — E1142 Type 2 diabetes mellitus with diabetic polyneuropathy: Secondary | ICD-10-CM

## 2011-08-14 DIAGNOSIS — E669 Obesity, unspecified: Secondary | ICD-10-CM

## 2011-08-14 DIAGNOSIS — K3189 Other diseases of stomach and duodenum: Secondary | ICD-10-CM

## 2011-08-14 DIAGNOSIS — R5381 Other malaise: Secondary | ICD-10-CM

## 2011-08-14 DIAGNOSIS — R41840 Attention and concentration deficit: Secondary | ICD-10-CM

## 2011-08-14 DIAGNOSIS — IMO0001 Reserved for inherently not codable concepts without codable children: Secondary | ICD-10-CM

## 2011-08-14 DIAGNOSIS — E049 Nontoxic goiter, unspecified: Secondary | ICD-10-CM

## 2011-08-14 DIAGNOSIS — E11649 Type 2 diabetes mellitus with hypoglycemia without coma: Secondary | ICD-10-CM

## 2011-08-14 DIAGNOSIS — E211 Secondary hyperparathyroidism, not elsewhere classified: Secondary | ICD-10-CM

## 2011-08-14 DIAGNOSIS — R413 Other amnesia: Secondary | ICD-10-CM | POA: Insufficient documentation

## 2011-08-14 DIAGNOSIS — R1310 Dysphagia, unspecified: Secondary | ICD-10-CM

## 2011-08-14 DIAGNOSIS — E1169 Type 2 diabetes mellitus with other specified complication: Secondary | ICD-10-CM

## 2011-08-14 DIAGNOSIS — R000 Tachycardia, unspecified: Secondary | ICD-10-CM

## 2011-08-14 DIAGNOSIS — I10 Essential (primary) hypertension: Secondary | ICD-10-CM

## 2011-08-14 DIAGNOSIS — N951 Menopausal and female climacteric states: Secondary | ICD-10-CM

## 2011-08-14 DIAGNOSIS — E119 Type 2 diabetes mellitus without complications: Secondary | ICD-10-CM

## 2011-08-14 DIAGNOSIS — R5383 Other fatigue: Secondary | ICD-10-CM

## 2011-08-14 LAB — CBC
HCT: 30.5 % — ABNORMAL LOW (ref 36.0–46.0)
Hemoglobin: 9.7 g/dL — ABNORMAL LOW (ref 12.0–15.0)
MCH: 21.1 pg — ABNORMAL LOW (ref 26.0–34.0)
MCHC: 31.8 g/dL (ref 30.0–36.0)
RDW: 16.7 % — ABNORMAL HIGH (ref 11.5–15.5)

## 2011-08-14 LAB — GLUCOSE, POCT (MANUAL RESULT ENTRY): POC Glucose: 164 mg/dl — AB (ref 70–99)

## 2011-08-14 LAB — POCT GLYCOSYLATED HEMOGLOBIN (HGB A1C): Hemoglobin A1C: 7.2

## 2011-08-14 MED ORDER — GLUCOSE BLOOD VI STRP: ORAL_STRIP | Status: AC

## 2011-08-14 MED ORDER — ESOMEPRAZOLE MAGNESIUM 40 MG PO CPDR: 40.0000 mg | DELAYED_RELEASE_CAPSULE | Freq: Every day | ORAL | Status: DC

## 2011-08-14 MED ORDER — METFORMIN HCL 500 MG PO TABS: 500.0000 mg | ORAL_TABLET | Freq: Two times a day (BID) | ORAL | Status: DC

## 2011-08-14 MED ORDER — TELMISARTAN 40 MG PO TABS: 40.0000 mg | ORAL_TABLET | Freq: Every day | ORAL | Status: DC

## 2011-08-14 NOTE — Progress Notes (Signed)
Subjective:  Patient Name: Alexa [name] Date of Birth: 1960-07-11  MRN: [id number]  Alexa [name]  presents to the office today for follow-up of her type 2 diabetes mellitus, obesity, combined hyperlipidemia, GERD, hypertension, ASHD, dyspepsia, pedal edema, nonproliferative diabetic retinopathy, goiter, depression, autonomic neuropathy, tachycardia, peripheral neuropathy, vitamin D deficiency, hypocalcemia, secondary hyperparathyroidism, glossitis, pallor, fatigue, iron deficiency anemia, and status post Roux-en-Y gastric bypass. She is now having more hot flashes, feels more fatigued, has difficulty with memory, and feels both scattered and overwhelmed at work and at home.   HISTORY OF PRESENT ILLNESS:   Alexa [name] is a 51 y.o. Caucasian woman. Alexa [name] was unaccompanied.   1. The patient first presented to me on 08/20/04 in referral from her primary care internist, Dr. Sharlet Salina, for evaluation and management of type 2 diabetes, obesity, and multiple medical issues. She was 51 years old.   A. Patient had a long history obesity. She was heavy as a child. She underwent menarche at age 8. At age 67 she was 180 pounds. In 1996 she weighed 218 pounds. In 1999 she was diagnosed with type 2 diabetes mellitus. Her weight at that point was 250 pounds. She was treated with Glucophage, and Actos. Actos made her gain weight. She has also been treated with glipizide in the past. More recently she had been treated with Lantus and Glucophage plus regular insulin as needed for when she took steroids for asthma attacks. Maximum weight had been 296 pounds one month prior to this visit. Her tendency to gain weight and her difficulty in losing weight were aggravated by long-standing, intermittent depression and by severe, recurrent asthma requiring the use of steroid medications.  B. Past medical history was also positive for a 60% blockage in one of her coronary arteries. She had significant issues with GERD and  dyspepsia. She also had combined hyperlipidemia. She had a previous cardiac catheterization and previous tonsillectomy. She was allergic to theophylline. Her pertinent review of systems was positive for some numbness and tingling in her feet. Family history was positive for type 2 diabetes in her father and her maternal grandmother. Her brother weighed 250 pounds at a height of 76 inches. Both her mother and her sister were hypothyroid.  C. On physical examination, her weight was 279.9 pounds. Her BMI was 49.6. Her blood pressure was 142/86. Her heart rate was 96. Her hemoglobin A1c was 9.8%. She was alert and oriented x3. Her affect was normal. Her insight was fair. She was obviously quite heavy. She had a 25 g thyroid gland. She had 1+ tremor of her hands. She had 1+ DP pulses and 2+ tinea pedis. Sensation to touch was intact in her feet. Laboratory data included a normal CMP. Her cholesterol was 175, triglycerides 76, HDL 53, and LDL 107. Her TSH was 2.98. Since she obviously did have type 2 diabetes mellitus associated with morbid obesity, and since weight loss was a major factor for her, I asked her to resume her metformin twice daily. I also started her on Byetta, initially 5 mcg twice daily and then 10 mcg twice daily. I discontinued her Lantus insulin. 2. During the last 7 years,we have had some major successes and some new problem areas.  A. T2DM: In October 2007 the combination of Byetta and metformin was causing more gastrointestinal problems. Patient opted to stop the metformin and continue the Byetta because it was helping her with weight control and blood sugar control. By 08/11/06 her weight had decreased to  267.6 pounds. Hemoglobin A1c was 8.6%. At that point she decided to have bariatric surgery. She had a Roux-en-Y gastric bypass on 12/20/2006. She subsequently lost weight down to 173.4 pounds on 05/23/08, but then subsequently regained weight to the 190s. Her  hemoglobin A1c values varied in  parallel with her weight. On 05/23/08, at the point of her lowest weight, her hemoglobin A1c dropped to a nadir of 6.3%.  Since then the hemoglobin A1c values have varied between 6.6 and 7.6%.    Although we were initially able to stop all of her diabetes medicines after surgery, when she began to regain weight we restarted metformin, 500 mg twice daily. Although she took her metformin twice daily for a long time, she has discontinued it since last visit.   B. Vitamin D deficiency, hypocalcemia, and secondary hyperparathyroidism: Just before her gastric bypass, I obtained baseline bone mineral metabolism studies.. Her 25-hydroxy vitamin D was 7 (normal greater than or equal to 30). Her calcium was 9.5 (normal 8.6-10.6). Her parathyroid hormone was 38.4 (normal 14-72). Subsequent to her surgery, the patient was supposed to be taking multivitamins with calcium and vitamin D, but did not always do so. On 03/29/2007 her 25-hydroxy vitamin D was 29, but her calcium decreased to 8.7. Her PTH was slightly elevated 73.5. Her 1,25-hydroxy vitamin D was 46. Her iron was 56. I asked her to make sure she took her multivitamins and calcium daily. Unfortunately, she has not always been compliant with her multivitamins and calcium. Her 25-hydroxy vitamin D values have varied between 19-27. Her calcium values have varied between 8.1-9.4. Her PTH values have remained elevated between 82.9-167. The patient's iron levels have also been low, between 32-34. She is supposed to be taking iron every day as well. Unfortunately, since last visit she has discontinued all vitamins and supplements.   C. The patient's last [facility] visit was on 09/24/10. In the interim, she has discontinued all other medications, to include Nexium and Micardis.   D. The patient saw me earlier this week and asked to se me. I saw her as an add-on patient today.   1. For at least the last 5-6 weeks she has been feeling stressed, scattered, and overwhelmed. She has  had trouble at work with multi-tasking, paying attention, and remembering. 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 In particular, she and her husband have been taking care of an adult with low IQ since his mother died in early 11-Mar-2022. He comes over to their house almost daily for emotional and problem-solving support and meals. He will be moving to live with family in [place] in August or September. Alexa [name]'s husband was also out of the country for three weeks recently, so she bore the entire burden of taking care of this mentally retarded man.    2. She knows and upset by the fact that she has not been watching her weight or taking care of her DM and hypertension. She has gained about 30 pounds since her trough weight after her gastric bypass. She often feels like a failure.    3. She is also having hot flashes. They were sporadic in the past year as her menstrual cycles have become oligomenorrheic, but the hot flashes have been much more frequent and severe in the past 6-8 weeks. She is flushing 3-4 times per day. She is also flushing and having hot flashes and  night sweats and following chills several time each night, causing significant sleep disturbances. Her hair is often drenched. Her LMP was this past April. Prior to that she had 3 menstrual periods in 2012. Dr. Lenord Fellers prescribed Flexeril, prn for back pains and poor sleep.     4. She has felt much more fatigued in the past 5-6 weeks. Despite that, she has been working long hours at home and at work.    5. She has not been taking Nexium, Mycardis, metformin, calcium, iron, or vitamin D.  3. Pertinent Review of Systems:  Constitutional: The patient feels tired and overwhelmed. She gained 7 pounds in the past 11 months, a gain of about 65 calories per day. She has recently been eating more crackers and pretzels. She is craving salt. She has 2-3  regular soft drinks per week, plus occasional sweet tea. She has not been doing much for exercise.  Eyes: Vision is good. There are no significant eye complaints. She is due for her annual eye examination in September. Her exam in September 2012 showed no signs of diabetic retinopathy. Neck: For at least the past year she has noted more frequent difficulty swallowing solids, especially meat and hard breads.  She has had similar symptoms intermittently before, sometimes lasting for several weeks. In the past month, however, she has had difficulty swallowing more frequently,  often enough for her to become concerned that food will get stuck. She has no complaints of anterior neck swelling, soreness, tenderness,  pressure, or discomfort.  Heart: Heart rate increases with exercise or other physical activity. She occasionally has costochondritic pains. She has no complaints of palpitations, irregular heat beats, other chest pain, or chest pressure. Gastrointestinal: She has occasional difficulty swallowing as above. She previously had many problems with reflux, but not in a long time. She sometimes feels something uncomfortable in her left upper ribcage. Abdomen has not been tender to palpation. Bowel movents seem normal. The patient has no complaints of excessive hunger, acid reflux, upset stomach, stomach aches or pains, diarrhea, or constipation. Legs: Muscle mass and strength seem normal. There are no complaints of numbness, tingling, burning, or pain. No edema is noted. Feet: There are no obvious foot problems. There are no complaints of numbness, tingling, burning, or pain. No edema is noted. Psychological: She states that she is not depressed, just tired and overwhelmed.  Hypoglycemia: None  4. BG report: She has not been checking her BGs.    PAST MEDICAL, FAMILY, AND SOCIAL HISTORY:  Past Medical History  Diagnosis Date  . Diabetes mellitus   . Asthma   . Hypertension   . GERD (gastroesophageal  reflux disease)   . CAD (coronary artery disease)   . Elevated homocysteine   . Chronic insomnia   . Diabetes mellitus type II   . Obesity, Class III, BMI 40-49.9 (morbid obesity)   . Combined hyperlipidemia   . Asthma, chronic   . GERD (gastroesophageal reflux disease)   . Dyspepsia   . Nonproliferative diabetic retinopathy associated with type 2 diabetes mellitus   . Goiter   . Depression   . Diabetic autonomic neuropathy   . DM type 2 with diabetic peripheral neuropathy   . Vitamin d deficiency   . Hyperparathyroidism , secondary, non-renal   . Tachycardia   . Glossitis   . Thyroiditis, autoimmune   . Fatigue   . Pallor   . Anemia, iron deficiency     Family History  Problem Relation Age of Onset  .  Thyroid disease Mother     Hypothyroid  . Diabetes Father     T2 DM  . Obesity Brother     250 pounds at a height of 76 inches.  . Thyroid disease Sister     Hypothyroid  . Diabetes Maternal Grandmother     Current outpatient prescriptions:cyclobenzaprine (FLEXERIL) 10 MG tablet, Take 1 tablet (10 mg total) by mouth 3 (three) times daily as needed for muscle spasms., Disp: 270 tablet, Rfl: 3;  TRYPTOPHAN PO, Take by mouth.  , Disp: , Rfl: ;  ALBUTEROL IN, Inhale into the lungs.  , Disp: , Rfl: ;  Calcium Carbonate-Vitamin D (CALCIUM-VITAMIN D) 500-200 MG-UNIT per tablet, Take 1 tablet by mouth 2 (two) times daily with a meal.  , Disp: , Rfl:  esomeprazole (NEXIUM) 40 MG capsule, Take 1 capsule (40 mg total) by mouth daily before breakfast., Disp: 90 capsule, Rfl: 3;  HYDROcodone-acetaminophen (VICODIN) 5-500 MG per tablet, Take 1 tablet by mouth every 6 (six) hours as needed.  , Disp: , Rfl: ;  metFORMIN (GLUCOPHAGE) 500 MG tablet, Take 1 tablet (500 mg total) by mouth 2 (two) times daily with a meal., Disp: 180 tablet, Rfl: 3 Multiple Vitamin (MULTIVITAMIN) tablet, Take 1 tablet by mouth daily.  , Disp: , Rfl: ;  telmisartan (MICARDIS) 40 MG tablet, Take 1 tablet (40 mg total)  by mouth daily., Disp: 90 tablet, Rfl: 3  Allergies as of 08/14/2011 - Review Complete 08/14/2011  Allergen Reaction Noted  . Theophyllines Palpitations 05/13/2010    1. 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  2. Activities: She has been swimming only about twice in the past month. Her last vacation was in September 2012. She did have a week off several weeks ago when she hurt her back. She did get rest then and felt better. She was mentally sharper after that week. She does not have much joy in her life right now.  3. Smoking, alcohol, or drugs: None 4. Primary Care Provider: Dr. Eden Emms Baxley  ROS: There are no other significant problems involving Alexa [name]'s other body systems.   Objective:  Vital Signs:  BP 159/103  Pulse 86  Wt 198 lb 12.8 oz (90.175 kg)  Repeat BPs: 157/102, 164/105 Ht Readings from Last 3 Encounters:  11/10/10 5\' 4"  (1.626 m)   Wt Readings from Last 3 Encounters:  08/14/11 198 lb 12.8 oz (90.175 kg)  11/10/10 195 lb (88.451 kg)  09/24/10 191 lb 4.8 oz (86.773 kg)   PHYSICAL EXAM:  Constitutional: The patient appears somewhat tired and concerned. Face: The face appears normal.  Eyes: There is no obvious arcus or proptosis. Moisture appears normal. Mouth: The oropharynx and tongue appear normal. Dentition appears to be normal for age. Oral moisture is normal. Neck: The neck appears to be visibly normal. No carotid bruits are noted. The thyroid gland is 20+ grams in size. The consistency of the thyroid gland is normal. The thyroid gland is not tender to palpation. Lungs: The lungs are clear to auscultation. Air movement is good. Heart: Heart rate and rhythm are regular. Heart sounds S1 and S2 are normal.She has an intermittent soft S4. I did not appreciate any pathologic cardiac murmurs. Abdomen: The abdomen is enlarged. Bowel sounds are normal. There is no  obvious hepatomegaly, splenomegaly, or other mass effect.  Arms: Muscle size and bulk are normal for age. Hands:  She has 1+ tremor of her hands, as before.  Phalangeal and metacarpophalangeal joints are normal. Palmar muscles are normal. Palmar skin is normal. Palmar moisture is also normal. Legs: Muscles appear normal for age. No edema is present. Feet: Feet are normally formed. Dorsalis pedal pulses are normal 1+ bilaterally. Neurologic: Strength is normal for age in both the upper and lower extremities. Muscle tone is normal. Sensation to touch is normal in both the legs and feet.    LAB DATA: Hemoglobin A1c today was 7.2%. compared with 7.4% at last visit, which was her highest hemoglobin A1c since her bariatric surgery. Both of these values were major increases from 6.6% in May 2012.            Labs 06/09/10: Cholesterol was 175, triglycerides 76, HDL 62, LDL 98. TSH was 1.217. Free T4 was 1.10. Free T3 was 2.7. 25-hydroxy vitamin D was 19. PTH was 167.   Assessment and Plan:   ASSESSMENT:  1. Type 2 diabetes mellitus: Ironically, her overall blood glucose control is better than it was last August, but much worse than it was in May 2012. There have been a lot of noncompliance issues. 2. Hypoglycemia: None 3. Obesity: There has been another gain in weight, equivalent to about 65 calories per day. 4. Autonomic neuropathy and tachycardia: The patient's autonomic neuropathy and  heart essentially parallel her  hemoglobin A1c, but with a lag period of several months. 5. Goiter: The thyroid gland is only minimally enlarged. It is not causing her difficulty in swallowing. The patient was euthyroid in May 2012. 6. Vitamin D deficiency, hypocalcemia, and secondary hyperparathyroidism: Her May 2012 value for PTH was the highest that we've seen. Since the last visit she has been non-compliant with taking her calcium and vitamin D, so it's likely that her vitamin D and calcium will be lower and her PTH  will probably be higher. We will see. 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  Patients such as Alexa [name] have to work harder to take in all of her multivitamins, calcium, and vitamin D. While there have been periods of time when she has been very compliant with her medical regimen, there have also been times when she has been very non-compliant, such as the past six or more months.  7. Hypertension: Her BP is dangerously high. She needs to take her antihypertensive medication daily. She also needs to try to get exercise regularly. 8. Fatigue, poor sleep, hot flashes, memory problems, attention problems: These issues are all likely to be interrelated. When she was home for a week with back pain, she got more rest, was less stressed, and came back to work able to focus and remember, even despite still having hot flashes.  A. She is perimenopausal. If the hot flashes and sleep problems do not improve in the next three months, it would probably be in her best interests to start estrogen-progesterone therapy.   B. The social stress involved in taking care of the mentally retarded man will end within the next 1-2 months. The stresses imposed by her work can be controlled somewhat by having fewer shifts as the Press photographer. The social stresses induced by her being willing to often take on more work load than she can really handle is a self-imposed wound.  C. Her life is out of balance. She is fairly burned out. She is anhedonic. She is not taking  the time to take care of and cherish herself.  D. 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 I've asked her to take time every day to nurture Alexa [name] and to have fun. I don't want to lose her to an early death. 9. Difficulty swallowing: The most likely cause of her intermittent difficulty  swallowing is esophageal dysmotility, but stricture is high on the differential diagnosis list. If she does have dysmotility,it  could be associated with stress or medications. I've asked her to keep a log of these events.   Since her difficulty in swallowing pills has been life-long, it's possible that part of the problem could be psychological, anatomic, or a combination of both.   PLAN:  1. Diagnostic: CMP, CBC, iron, TFTs. Lipid panel, Vitamin D and PTH,  Calcium. Come back in one week for BP check. Check BGs daily, at different times. 2. Therapeutic: Be as compliant as possible with all of her medications and supplements. Try to fit in exercise daily. 3. Patient education: She is living dangerously by not taking her meds and supplements. She must take her medications and supplements. 4. Follow-up: 3 months  Level of Service: This visit lasted in excess of 120 minutes. More than 50% of the visit was devoted to counseling.   David Stall, MD 08/14/2011 10:11 AM

## 2011-08-14 NOTE — Patient Instructions (Addendum)
Follow-up visit in three months. Please come back to clinic in 7-10 days for BP check. Please take all of your medication as prescribed. Please set aside an hour per day for exercise. Please re-balance.

## 2011-08-15 LAB — COMPREHENSIVE METABOLIC PANEL
Alkaline Phosphatase: 117 U/L (ref 39–117)
CO2: 28 mEq/L (ref 19–32)
Creat: 0.77 mg/dL (ref 0.50–1.10)
Glucose, Bld: 122 mg/dL — ABNORMAL HIGH (ref 70–99)
Total Bilirubin: 0.4 mg/dL (ref 0.3–1.2)

## 2011-08-15 LAB — LIPID PANEL
Cholesterol: 189 mg/dL (ref 0–200)
HDL: 71 mg/dL (ref >39)
Triglycerides: 70 mg/dL (ref <150)

## 2011-08-15 LAB — MICROALBUMIN / CREATININE URINE RATIO
Creatinine, Urine: 112.3 mg/dL
Microalb Creat Ratio: 5.3 mg/g (ref 0.0–30.0)
Microalb, Ur: 0.6 mg/dL (ref 0.00–1.89)

## 2011-08-15 LAB — TSH: TSH: 3.211 u[IU]/mL (ref 0.350–4.500)

## 2011-08-15 LAB — T3, FREE: T3, Free: 3 pg/mL (ref 2.3–4.2)

## 2011-08-15 LAB — T4, FREE: Free T4: 1.16 ng/dL (ref 0.80–1.80)

## 2011-08-17 LAB — PTH, INTACT AND CALCIUM: PTH: 144 pg/mL — ABNORMAL HIGH (ref 14.0–72.0)

## 2011-08-19 LAB — VITAMIN D 1,25 DIHYDROXY: Vitamin D3 1, 25 (OH)2: 55 pg/mL

## 2011-08-26 ENCOUNTER — Ambulatory Visit: Payer: 59 | Admitting: "Endocrinology

## 2011-11-12 ENCOUNTER — Other Ambulatory Visit: Payer: 59 | Admitting: Internal Medicine

## 2011-11-13 ENCOUNTER — Encounter: Payer: 59 | Admitting: Internal Medicine

## 2011-11-26 ENCOUNTER — Encounter: Payer: Self-pay | Admitting: Internal Medicine

## 2011-11-26 ENCOUNTER — Other Ambulatory Visit: Payer: Self-pay | Admitting: Internal Medicine

## 2011-11-26 ENCOUNTER — Ambulatory Visit (INDEPENDENT_AMBULATORY_CARE_PROVIDER_SITE_OTHER): Payer: 59 | Admitting: Internal Medicine

## 2011-11-26 VITALS — BP 116/80 | HR 76 | Temp 97.7°F | Ht 64.0 in | Wt 200.0 lb

## 2011-11-26 DIAGNOSIS — Z124 Encounter for screening for malignant neoplasm of cervix: Secondary | ICD-10-CM

## 2011-11-26 DIAGNOSIS — D649 Anemia, unspecified: Secondary | ICD-10-CM

## 2011-11-26 DIAGNOSIS — I10 Essential (primary) hypertension: Secondary | ICD-10-CM

## 2011-11-26 DIAGNOSIS — E785 Hyperlipidemia, unspecified: Secondary | ICD-10-CM

## 2011-11-26 DIAGNOSIS — E559 Vitamin D deficiency, unspecified: Secondary | ICD-10-CM

## 2011-11-26 DIAGNOSIS — E119 Type 2 diabetes mellitus without complications: Secondary | ICD-10-CM

## 2011-11-26 LAB — COMPREHENSIVE METABOLIC PANEL
ALT: 15 U/L (ref 0–35)
Alkaline Phosphatase: 106 U/L (ref 39–117)
Creat: 0.78 mg/dL (ref 0.50–1.10)
Sodium: 139 mEq/L (ref 135–145)
Total Bilirubin: 0.4 mg/dL (ref 0.3–1.2)
Total Protein: 7.2 g/dL (ref 6.0–8.3)

## 2011-11-26 LAB — CBC WITH DIFFERENTIAL/PLATELET
Basophils Relative: 1 % (ref 0–1)
Eosinophils Absolute: 0.1 10*3/uL (ref 0.0–0.7)
Eosinophils Relative: 2 % (ref 0–5)
MCH: 21 pg — ABNORMAL LOW (ref 26.0–34.0)
MCHC: 31.8 g/dL (ref 30.0–36.0)
MCV: 66 fL — ABNORMAL LOW (ref 78.0–100.0)
Neutrophils Relative %: 45 % (ref 43–77)
Platelets: 354 10*3/uL (ref 150–400)
RDW: 15.8 % — ABNORMAL HIGH (ref 11.5–15.5)

## 2011-11-26 LAB — LIPID PANEL
Cholesterol: 201 mg/dL — ABNORMAL HIGH (ref 0–200)
LDL Cholesterol: 121 mg/dL — ABNORMAL HIGH (ref 0–99)
Total CHOL/HDL Ratio: 3 Ratio
Triglycerides: 70 mg/dL (ref <150)
VLDL: 14 mg/dL (ref 0–40)

## 2011-11-26 LAB — POCT URINALYSIS DIPSTICK
Blood, UA: NEGATIVE
Glucose, UA: NEGATIVE
Nitrite, UA: NEGATIVE
Protein, UA: NEGATIVE
Urobilinogen, UA: NEGATIVE
pH, UA: 7.5

## 2011-11-26 LAB — IRON AND TIBC
TIBC: 470 ug/dL (ref 250–470)
UIBC: 396 ug/dL (ref 125–400)

## 2011-11-26 LAB — HEMOGLOBIN A1C: Hgb A1c MFr Bld: 8.9 % — ABNORMAL HIGH (ref <5.7)

## 2011-11-26 MED ORDER — FERROUS FUMARATE 325 (106 FE) MG PO TABS: 1.0000 | ORAL_TABLET | Freq: Every day | ORAL | Status: DC

## 2011-11-26 NOTE — Progress Notes (Signed)
Subjective:    Patient ID: Alexa [name], female    DOB: 01/29/1960, 51 y.o.   MRN: [id number]  HPI 51 year old white female status post gastric bypass surgery in 2008 with prior history of diabetes which improved after gastric bypass surgery. History of GE reflux. History of low back pain. Patient works as a Nurse, learning disability at [employer]. She is here for health maintenance exam. Dr. Fransico Michael Alexa follows her as well. She has history of hypertension, type 2 diabetes mellitus, hyperlipidemia, GE reflux. Has been having considerable issues with vaginal dryness and sexual intercourse is painful. Will get influenza immunization through employment. Current weight 200 pounds. In 2012 , weight was 195 pounds. Admits to not following a strict diet status post gastric bypass surgery. She Alexa has a history of asthma and uses when necessary inhalers. Has had 2 cardiac catheterizations in 2003 and 2007 showing minor showed of coronary disease. Pneumovax immunization November 2004.  In September [age] she was 289 pounds. In October 2050 she was 194 pounds.  She does not smoke or consume alcohol. She is married. 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  Family history: Father with diabetes and hypertension. Mother with history of thyroid disorder as does her sister. Patient has 3 brothers and 2 sisters.      Review of Systems  Constitutional: Positive for fatigue.  HENT: Negative.   Eyes: Negative.   Respiratory:       History of asthma  Cardiovascular: Negative.   Gastrointestinal:       History of GE reflux  Endocrine:       Type 2 diabetes mellitus treated with metformin but she's not been compliant  Allergic/Immunologic:       History of asthma  Neurological: Negative.   Hematological: Negative.   Psychiatric/Behavioral:       Noncompliant with medical regimen for many years. Cannot explain why.       Objective:   Physical Exam  Vitals reviewed. Constitutional: She is oriented to person, place,  and time. She appears well-developed and well-nourished. No distress.  HENT:  Head: Normocephalic and atraumatic.  Right Ear: External ear normal.  Left Ear: External ear normal.  Nose: Nose normal.  Mouth/Throat: Oropharynx is clear and moist.  Eyes: Conjunctivae and EOM are normal. Pupils are equal, round, and reactive to light. Right eye exhibits no discharge. Left eye exhibits no discharge. No scleral icterus.  Neck: Neck supple. No JVD present. No thyromegaly present.  Cardiovascular: Normal rate, regular rhythm, normal heart sounds and intact distal pulses.   No murmur heard. Pulmonary/Chest: Effort normal and breath sounds normal. No respiratory distress. She has no wheezes. She has no rales.  Breasts normal female  Abdominal: Bowel sounds are normal. She exhibits no distension and no mass. There is no tenderness. There is no rebound and no guarding.  Genitourinary: Vagina normal and uterus normal.  Pap taken. bimanual normal. Vaginal dryness.  Musculoskeletal: She exhibits no edema.  Lymphadenopathy:    She has no cervical adenopathy.  Neurological: She is alert and oriented to person, place, and time. She has normal reflexes. No cranial nerve deficit. Coordination normal.  Skin: Skin is warm and dry. No rash noted. She is not diaphoretic.  Diabetic foot exam without calluses or ulcers. Pulses in feet are normal.  Psychiatric: She has a normal mood and affect. Her behavior is normal. Judgment and thought content normal.          Assessment & Plan:   Vaginal  dryness-start Prempro 0.625 mg/5 mg daily  Type 2 diabetes mellitus-noncompliant with metformin  Noncompliant with diet  History of asthma  History of GE reflux  History of gastric bypass surgery  History of nonobstructive coronary disease  Vitamin D deficiency  Plan:  Encourage patient to be compliant with metformin. Replete vitamin D by mouth. Oral estrogen replacement ordered. Labs are pending.

## 2011-11-27 ENCOUNTER — Telehealth: Payer: Self-pay | Admitting: Internal Medicine

## 2011-11-27 ENCOUNTER — Other Ambulatory Visit (HOSPITAL_COMMUNITY)
Admission: RE | Admit: 2011-11-27 | Discharge: 2011-11-27 | Disposition: A | Discharge status: Home / Self Care | Payer: 59 | Source: Ambulatory Visit | Attending: Internal Medicine | Admitting: Internal Medicine

## 2011-11-27 DIAGNOSIS — Z01419 Encounter for gynecological examination (general) (routine) without abnormal findings: Secondary | ICD-10-CM | POA: Insufficient documentation

## 2011-11-27 LAB — VITAMIN B12: Vitamin B-12: 308 pg/mL (ref 211–911)

## 2011-11-27 NOTE — Telephone Encounter (Signed)
Patient had physical examination yesterday with fasting lab work. Has profound microcytosis with hemoglobin 10.3 g. Iron level falls within normal limits. B12 and folate levels have been added. Have never seen her MCV be 66 before. She also has vitamin D deficiency. Needs to take 50,000 units vitamin  D weekly for 12 weeks then 2000 units vitamin D 3 daily. Have asked her to give [name] a call back about her anemia. Depending on B12 and folate levels, may need to refer her to hematology for further evaluation.

## 2011-11-30 NOTE — Telephone Encounter (Signed)
Scheduled for an appointment to discuss labs this week

## 2011-12-03 ENCOUNTER — Ambulatory Visit (INDEPENDENT_AMBULATORY_CARE_PROVIDER_SITE_OTHER): Payer: 59 | Admitting: Internal Medicine

## 2011-12-03 ENCOUNTER — Encounter: Payer: Self-pay | Admitting: Internal Medicine

## 2011-12-03 VITALS — BP 128/80 | HR 84 | Temp 98.8°F | Wt 201.0 lb

## 2011-12-03 DIAGNOSIS — E559 Vitamin D deficiency, unspecified: Secondary | ICD-10-CM

## 2011-12-03 DIAGNOSIS — D509 Iron deficiency anemia, unspecified: Secondary | ICD-10-CM

## 2011-12-03 DIAGNOSIS — E119 Type 2 diabetes mellitus without complications: Secondary | ICD-10-CM

## 2011-12-03 DIAGNOSIS — D649 Anemia, unspecified: Secondary | ICD-10-CM

## 2011-12-03 MED ORDER — ERGOCALCIFEROL 1.25 MG (50000 UT) PO CAPS: 50000.0000 [IU] | ORAL_CAPSULE | ORAL | Status: DC

## 2011-12-03 MED ORDER — FERROUS FUMARATE 325 (106 FE) MG PO TABS: 1.0000 | ORAL_TABLET | Freq: Two times a day (BID) | ORAL | Status: DC

## 2011-12-27 NOTE — Progress Notes (Signed)
  Subjective:    Patient ID: Alexa [name], female    DOB: [birth year], 51 y.o.   MRN: [id number]  HPI 51 year old white female with history of gastric bypass surgery in today discuss recent finding of anemia. Patient has a long-standing history of iron deficiency anemia. Dr. Fransico Michael has had her on iron supplementation. Review of chart indicates she has a long-standing history of anemia dating back about 5 years at least. Her iron level is actually improved from one done a year ago. She is vitamin D deficient needs to take 2000 units vitamin D 3 over-the-counter. B12, folate iron and iron-binding capacity are within normal limits. Hemoglobin A1c was not good at 8.9%. She admits she's not been taking care of her diabetes recently because of stress.    Review of Systems     Objective:   Physical Exam not examined today. Was examined previously at last visit. Discussion today for 15 minutes regarding anemia and diabetes        Assessment & Plan:  History of iron deficiency anemia related to gastric bypass surgery and menstrual blood loss  Diabetes mellitus  Status post gastric bypass surgery  Vitamin D deficiency  Plan: Patient is to continue iron supplementation on a regular basis, take over-the-counter vitamin D 2000 units daily. Reassess in 6 months. Needs to consider screening colonoscopy now that she is 51 years old.

## 2011-12-27 NOTE — Patient Instructions (Addendum)
Continue taking iron supplement on a regular basis as prescribed Dr. Fransico Michael. Take vitamin D 2000 units over-the-counter daily. Try to take better care of yourself and keep diabetes under better control. Return in 6 months.

## 2012-01-13 ENCOUNTER — Ambulatory Visit: Payer: 59 | Admitting: "Endocrinology

## 2012-03-08 ENCOUNTER — Other Ambulatory Visit: Payer: 59 | Admitting: Internal Medicine

## 2012-03-10 ENCOUNTER — Ambulatory Visit: Payer: 59 | Admitting: Internal Medicine

## 2012-04-14 ENCOUNTER — Other Ambulatory Visit: Payer: 59 | Admitting: Internal Medicine

## 2012-04-14 DIAGNOSIS — E119 Type 2 diabetes mellitus without complications: Secondary | ICD-10-CM

## 2012-04-14 DIAGNOSIS — E559 Vitamin D deficiency, unspecified: Secondary | ICD-10-CM

## 2012-04-14 DIAGNOSIS — D509 Iron deficiency anemia, unspecified: Secondary | ICD-10-CM

## 2012-04-14 LAB — IRON AND TIBC
%SAT: 57 % — ABNORMAL HIGH (ref 20–55)
TIBC: 357 ug/dL (ref 250–470)
UIBC: 154 ug/dL (ref 125–400)

## 2012-04-14 LAB — HEMOGLOBIN A1C: Mean Plasma Glucose: 209 mg/dL — ABNORMAL HIGH (ref <117)

## 2012-04-15 ENCOUNTER — Ambulatory Visit: Payer: 59 | Admitting: Internal Medicine

## 2012-04-15 LAB — VITAMIN D 25 HYDROXY (VIT D DEFICIENCY, FRACTURES): Vit D, 25-Hydroxy: 21 ng/mL — ABNORMAL LOW (ref 30–89)

## 2012-04-18 ENCOUNTER — Ambulatory Visit (INDEPENDENT_AMBULATORY_CARE_PROVIDER_SITE_OTHER): Payer: 59 | Admitting: Internal Medicine

## 2012-04-18 ENCOUNTER — Encounter: Payer: Self-pay | Admitting: Internal Medicine

## 2012-04-18 VITALS — BP 130/86 | HR 88 | Temp 99.0°F | Wt 201.5 lb

## 2012-04-18 DIAGNOSIS — Z9119 Patient's noncompliance with other medical treatment and regimen: Secondary | ICD-10-CM | POA: Insufficient documentation

## 2012-04-18 DIAGNOSIS — Z8639 Personal history of other endocrine, nutritional and metabolic disease: Secondary | ICD-10-CM

## 2012-04-18 DIAGNOSIS — Z862 Personal history of diseases of the blood and blood-forming organs and certain disorders involving the immune mechanism: Secondary | ICD-10-CM

## 2012-04-18 DIAGNOSIS — Z9884 Bariatric surgery status: Secondary | ICD-10-CM

## 2012-04-18 DIAGNOSIS — E559 Vitamin D deficiency, unspecified: Secondary | ICD-10-CM

## 2012-04-18 DIAGNOSIS — IMO0002 Reserved for concepts with insufficient information to code with codable children: Secondary | ICD-10-CM

## 2012-04-18 DIAGNOSIS — E1165 Type 2 diabetes mellitus with hyperglycemia: Secondary | ICD-10-CM

## 2012-04-18 DIAGNOSIS — Z91199 Patient's noncompliance with other medical treatment and regimen due to unspecified reason: Secondary | ICD-10-CM | POA: Insufficient documentation

## 2012-04-18 DIAGNOSIS — E118 Type 2 diabetes mellitus with unspecified complications: Secondary | ICD-10-CM

## 2012-04-18 LAB — HM DIABETES EYE EXAM

## 2012-04-18 NOTE — Patient Instructions (Addendum)
Please take metformin regularly. Return in 3 months for office visit hemoglobin A1c, lipid panel, vitamin D level, iron and iron-binding capacity as well as B12 level. Take 50,000 units vitamin D weekly for 12 weeks in 2000 units vitamin D 3 daily. Watch  diet and exercise.

## 2012-04-18 NOTE — Progress Notes (Signed)
  Subjective:    Patient ID: Alexa [name], female    DOB: 1960/12/25, 52 y.o.   MRN: [id number]  HPI   Seen in October 2013 for followup on multiple medical problems including type 2 diabetes mellitus, hypertension, hyperlipidemia. Hemoglobin A1c was elevated 8.9%. Patient admitted that she was not following a good diet. Because of this, advised repeat check in 3 months instead of 6 months. She does have a history of noncompliance. She is status post gastric by pass surgery in 2008 and says she's gained about 30 pounds. Hasn't been able to swim because she was so fatigued. Was found to be iron deficient at last visit has been taking iron supplement twice daily. Iron level has improved considerably. Continues to eat sweets. Hasn't been taking metformin consistently which is disappointing. She took 50,000 units weekly of vitamin D for 12 weeks but failed to followup with daily over-the-counter medication. Vitamin D level still low at 21. B12 level is within normal limits. Hemoglobin A1c is 8.9% and has not changed since last visit. She is supposed to be taking metformin twice daily. Admits reluctantly she's not been compliant. Long-standing history of noncompliance with diabetic regimen which pushed her to do bariatric surgery. Followed also by Dr. Fransico [name].  History of cardiac catheterization in August 2003 showing nonobstructive coronary artery disease. Was hospitalized in 2007 and had another cardiac catheterization showing minor nonobstructive coronary disease and normal left ventricular function.  Had Pneumovax 2004. 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 Weight in November 2013 was  201 pounds. In October 2010 she was 179 pounds. In October 2011 she was 194 pounds. In October 2008 she was 266 pounds. In September 2005 she was 289 pounds.  Laparoscopic Roux-en-Y gastric bypass  done by Dr. Daphine [name] 12/20/2006.  She is on estrogen replacement for vaginal dryness  Does not smoke or consume alcohol. She is married. Husband is a Optician, dispensing.  Family history: Father with diabetes and hypertension. Mother with history of thyroid disorder as does sister.  Patient has 3 or others and 2 sisters.      Review of Systems     Objective:   Physical Exam spent 25 minutes speaking with patient about noncompliance and issues regarding multiple medical problems. Chest clear to auscultation. Cardiac exam regular rate and rhythm normal S1 and S2.        Assessment & Plan:  Noncompliance with diabetic treatment-uncontrolled type 2 diabetes  Status post bariatric surgery  History of iron deficiency  History of vitamin D deficiency  Hyperlipidemia  History of hypertension-resolved after bariatric surgery. Patient has stable blood pressure today off medication.  Plan: Return July 2014. Spoke with patient about noncompliance once again. He is taking metformin twice daily. Needs to diet and exercise. Take Drisdol 50,000 units weekly for 12 weeks followed by 2000 units vitamin D 3 daily. Continue iron twice daily. Not taking B12 supplements at present time. When she returns she will have lipid panel, hemoglobin A1c, vitamin D level, iron binding capacity B12 level

## 2012-04-19 NOTE — Patient Instructions (Addendum)
Please be compliant with metformin twice daily. Replete vitamin D by mouth. Review labs drawn today

## 2012-05-16 ENCOUNTER — Ambulatory Visit: Payer: 59 | Admitting: Internal Medicine

## 2012-07-08 ENCOUNTER — Other Ambulatory Visit: Payer: Self-pay

## 2012-07-08 DIAGNOSIS — Z1231 Encounter for screening mammogram for malignant neoplasm of breast: Secondary | ICD-10-CM

## 2012-07-20 ENCOUNTER — Encounter: Payer: Self-pay | Admitting: Internal Medicine

## 2012-08-11 ENCOUNTER — Other Ambulatory Visit: Payer: 59 | Admitting: Internal Medicine

## 2012-08-12 ENCOUNTER — Ambulatory Visit: Payer: 59 | Admitting: Internal Medicine

## 2012-08-18 ENCOUNTER — Ambulatory Visit
Admission: RE | Admit: 2012-08-18 | Discharge: 2012-08-18 | Disposition: A | Discharge status: Home / Self Care | Payer: 59 | Source: Ambulatory Visit

## 2012-08-18 DIAGNOSIS — Z1231 Encounter for screening mammogram for malignant neoplasm of breast: Secondary | ICD-10-CM

## 2012-09-01 ENCOUNTER — Ambulatory Visit (INDEPENDENT_AMBULATORY_CARE_PROVIDER_SITE_OTHER): Payer: 59 | Admitting: "Endocrinology

## 2012-09-01 VITALS — BP 141/97 | HR 90 | Wt 208.0 lb

## 2012-09-01 DIAGNOSIS — K3189 Other diseases of stomach and duodenum: Secondary | ICD-10-CM

## 2012-09-01 DIAGNOSIS — E1143 Type 2 diabetes mellitus with diabetic autonomic (poly)neuropathy: Secondary | ICD-10-CM

## 2012-09-01 DIAGNOSIS — E1169 Type 2 diabetes mellitus with other specified complication: Secondary | ICD-10-CM

## 2012-09-01 DIAGNOSIS — E559 Vitamin D deficiency, unspecified: Secondary | ICD-10-CM

## 2012-09-01 DIAGNOSIS — G909 Disorder of the autonomic nervous system, unspecified: Secondary | ICD-10-CM

## 2012-09-01 DIAGNOSIS — K219 Gastro-esophageal reflux disease without esophagitis: Secondary | ICD-10-CM

## 2012-09-01 DIAGNOSIS — R000 Tachycardia, unspecified: Secondary | ICD-10-CM

## 2012-09-01 DIAGNOSIS — E049 Nontoxic goiter, unspecified: Secondary | ICD-10-CM

## 2012-09-01 DIAGNOSIS — I498 Other specified cardiac arrhythmias: Secondary | ICD-10-CM

## 2012-09-01 DIAGNOSIS — E1149 Type 2 diabetes mellitus with other diabetic neurological complication: Secondary | ICD-10-CM

## 2012-09-01 DIAGNOSIS — IMO0001 Reserved for inherently not codable concepts without codable children: Secondary | ICD-10-CM

## 2012-09-01 DIAGNOSIS — R1013 Epigastric pain: Secondary | ICD-10-CM

## 2012-09-01 DIAGNOSIS — E11649 Type 2 diabetes mellitus with hypoglycemia without coma: Secondary | ICD-10-CM

## 2012-09-01 DIAGNOSIS — R5383 Other fatigue: Secondary | ICD-10-CM

## 2012-09-01 DIAGNOSIS — R5381 Other malaise: Secondary | ICD-10-CM

## 2012-09-01 DIAGNOSIS — I10 Essential (primary) hypertension: Secondary | ICD-10-CM

## 2012-09-01 DIAGNOSIS — E669 Obesity, unspecified: Secondary | ICD-10-CM

## 2012-09-01 LAB — COMPREHENSIVE METABOLIC PANEL
AST: 14 U/L (ref 0–37)
Albumin: 4.3 g/dL (ref 3.5–5.2)
BUN: 9 mg/dL (ref 6–23)
CO2: 27 mEq/L (ref 19–32)
Calcium: 9 mg/dL (ref 8.4–10.5)
Chloride: 103 mEq/L (ref 96–112)
Creat: 0.78 mg/dL (ref 0.50–1.10)
Potassium: 4.2 mEq/L (ref 3.5–5.3)

## 2012-09-01 LAB — T3, FREE: T3, Free: 2.9 pg/mL (ref 2.3–4.2)

## 2012-09-01 LAB — TSH: TSH: 1.816 u[IU]/mL (ref 0.350–4.500)

## 2012-09-01 LAB — IRON: Iron: 26 ug/dL — ABNORMAL LOW (ref 42–145)

## 2012-09-01 MED ORDER — TELMISARTAN 40 MG PO TABS: 40.0000 mg | ORAL_TABLET | Freq: Every day | ORAL | Status: DC

## 2012-09-01 MED ORDER — EXENATIDE ER 2 MG ~~LOC~~ SUSR: 2.0000 mg | SUBCUTANEOUS | Status: DC

## 2012-09-01 MED ORDER — ESOMEPRAZOLE MAGNESIUM 40 MG PO CPDR: 40.0000 mg | DELAYED_RELEASE_CAPSULE | Freq: Every day | ORAL | Status: DC

## 2012-09-01 NOTE — Progress Notes (Signed)
Subjective:  Patient Name: Alexa [name] Date of Birth: February 07, 1960  MRN: [id number]  Alexa [name]  presents to the office today for follow-up of her type 2 diabetes mellitus, obesity, combined hyperlipidemia, GERD, hypertension, ASHD, dyspepsia, pedal edema, non-proliferative diabetic retinopathy, goiter, depression, autonomic neuropathy, tachycardia, peripheral neuropathy, vitamin D deficiency, hypocalcemia, secondary hyperparathyroidism, glossitis, pallor, fatigue, iron deficiency anemia, and status post Roux-en-Y gastric bypass. She is still feeling both scattered and overwhelmed at work and at home.   HISTORY OF PRESENT ILLNESS:   Alexa [name] is a 52 y.o. Caucasian woman. Alexa [name] was accompanied by her husband.   1. The patient first presented to me on 08/20/04 in referral from her primary care internist, Dr. Sharlet Salina, for evaluation and management of type 2 diabetes, obesity, and multiple medical issues. She was 52 years old.   A. Patient had a long history obesity. She was heavy as a child. She underwent menarche at age 2. At age 58 she was 180 pounds. In 1996 she weighed 218 pounds. In 1999 she was diagnosed with type 2 diabetes mellitus. Her weight at that point was 250 pounds. She was treated with Glucophage, and Actos. Actos made her gain weight. She has also been treated with glipizide in the past. More recently she had been treated with Lantus and Glucophage plus regular insulin as needed for when she took steroids for asthma attacks. Maximum weight had been 296 pounds one month prior to this visit. Her tendency to gain weight and her difficulty in losing weight were aggravated by long-standing, intermittent depression and by severe, recurrent asthma requiring the use of steroid medications.  B. Past medical history was also positive for a 60% blockage in one of her coronary arteries. She had significant issues with GERD and dyspepsia. She also had combined hyperlipidemia. She had a  previous cardiac catheterization and previous tonsillectomy. She was allergic to theophylline. Her pertinent review of systems was positive for some numbness and tingling in her feet. Family history was positive for type 2 diabetes in her father and her maternal grandmother. Her brother weighed 250 pounds at a height of 76 inches. Both her mother and her sister were hypothyroid.  C. On physical examination, her weight was 279.9 pounds. Her BMI was 49.6. Her blood pressure was 142/86. Her heart rate was 96. Her hemoglobin A1c was 9.8%. She was alert and oriented x3. Her affect was normal. Her insight was fair. She was obviously quite heavy. She had a 25 g thyroid gland. She had 1+ tremor of her hands. She had 1+ DP pulses and 2+ tinea pedis. Sensation to touch was intact in her feet. Laboratory data included a normal CMP. Her cholesterol was 175, triglycerides 76, HDL 53, and LDL 107. Her TSH was 2.98. Since she obviously did have type 2 diabetes mellitus associated with morbid obesity, and since weight loss was a major factor for her, I asked her to resume her metformin twice daily. I also started her on Byetta, initially 5 mcg twice daily and then 10 mcg twice daily. I discontinued her Lantus insulin.  2. During the last 8 years,we have had some major successes and some new problem areas.  A. T2DM: In October 2007 the combination of Byetta and metformin was causing more gastrointestinal problems. Patient opted to stop the metformin and continue the Byetta because it was helping her with weight control and blood sugar control. By 08/11/06 her weight had decreased to 267.6 pounds. Hemoglobin A1c was 8.6%. At that  point she decided to have bariatric surgery. She had a Roux-en-Y gastric bypass on 12/20/2006. She subsequently lost weight down to 173.4 pounds on 05/23/08, but then subsequently regained weight to the 190s. Her  hemoglobin A1c values varied in parallel with her weight. On 05/23/08, at the point of her  lowest weight, her hemoglobin A1c dropped to a nadir of 6.3%.  Since then the hemoglobin A1c values have varied between 6.6 and 7.6%.    Although we were initially able to stop all of her diabetes medicines after surgery, when she began to regain weight we restarted metformin, 500 mg twice daily. Although she took her metformin twice daily for a long time, she has discontinued it since last visit.   B. Vitamin D deficiency, hypocalcemia, and secondary hyperparathyroidism: Just before her gastric bypass, I obtained baseline bone mineral metabolism studies.. Her 25-hydroxy vitamin D was 7 (normal greater than or equal to 30). Her calcium was 9.5 (normal 8.6-10.6). Her parathyroid hormone was 38.4 (normal 14-72). Subsequent to her surgery, the patient was supposed to be taking multivitamins with calcium and vitamin D, but did not always do so. On 03/29/2007 her 25-hydroxy vitamin D was 29, but her calcium decreased to 8.7. Her PTH was slightly elevated 73.5. Her 1,25-hydroxy vitamin D was 46. Her iron was 56. I asked her to make sure she took her multivitamins and calcium daily. Unfortunately, she has not always been compliant with her multivitamins and calcium. Her 25-hydroxy vitamin D values have varied between 19-27. Her calcium values have varied between 8.1-9.4. Her PTH values have remained elevated between 82.9-167. The patient's iron levels have also been low, between 32-34. She is supposed to be taking iron every day as well. Unfortunately, since last visit she has discontinued all vitamins and supplements.   3. The patient's last [facility] visit was on 08/14/11.  A. In the interim, she has discontinued all of her medications except Flexeril. She sometimes takes iron.   B. The patient saw me earlier this week and asked to see me. She wants to start Bydurieon, the once weekly GLP-1 analog. I am seeing her today as an add-on patient.  C. In the past year she has gained 10 pounds.   D. She has been very tired  and exhausted. She has "horrible leg cramps" 2-3 times per week.   E. She continues to feel stressed and overwhelmed. 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 Neither she nor her husband have had a vacation in more than one year.   F. She has also been having dental problems.  G. She is also having hot flashes. Dr. Lenord Fellers put her on an OCP, which helped the hot flashes and her mental fog, but she stopped taking it after a while.  4. Pertinent Review of Systems:  Constitutional: The patient feels tired and overwhelmed. She still craves sweets and salt. She is not having many regular soft drinks or sweet tea. She has not been doing much exercise.  Eyes: Vision is good. There are no significant eye complaints. She had an eye exam in April or May. That exam showed no signs of diabetic retinopathy. Neck: For at least the past 2 years she has noted more frequent difficulty swallowing solids, especially meat and hard breads.  She has had similar symptoms intermittently before, sometimes lasting for several weeks. She has no complaints of anterior neck swelling,  soreness, tenderness,  pressure, or discomfort.  Heart: Heart rate increases with exercise or other physical activity. She occasionally has costochondritic pains. She has no complaints of palpitations, irregular heat beats, other chest pain, or chest pressure. Gastrointestinal: She has occasional difficulty swallowing as above. She rarely has problems with reflux. She infrequently feels something uncomfortable in her left upper ribcage. Abdomen has not been tender to palpation. Bowel movents seem normal. The patient has no complaints of excessive hunger, acid reflux, upset stomach, stomach aches or pains, diarrhea, or constipation. Legs: Muscle mass and strength seem normal. Leg cramps are a big problem as noted above. There are no other complaints of numbness, tingling,  burning, or pain. No edema is noted. Feet: There are no obvious foot problems. There are no complaints of numbness, tingling, burning, or pain. No edema is noted. Psychological: She states that she is not depressed, just tired and overwhelmed. There is not much joy in her life.  Hypoglycemia: None  4. BG report: She has not been checking her BGs.    PAST MEDICAL, FAMILY, AND SOCIAL HISTORY:  Past Medical History  Diagnosis Date  . Diabetes mellitus   . Asthma   . Hypertension   . GERD (gastroesophageal reflux disease)   . CAD (coronary artery disease)   . Elevated homocysteine   . Chronic insomnia   . Diabetes mellitus type II   . Obesity, Class III, BMI 40-49.9 (morbid obesity)   . Combined hyperlipidemia   . Asthma, chronic   . GERD (gastroesophageal reflux disease)   . Dyspepsia   . Nonproliferative diabetic retinopathy associated with type 2 diabetes mellitus   . Goiter   . Depression   . Diabetic autonomic neuropathy   . DM type 2 with diabetic peripheral neuropathy   . Vitamin D deficiency   . Hyperparathyroidism , secondary, non-renal   . Tachycardia   . Glossitis   . Thyroiditis, autoimmune   . Fatigue   . Pallor   . Anemia, iron deficiency   . Essential tremor     Family History  Problem Relation Age of Onset  . Thyroid disease Mother     Hypothyroid  . Diabetes Father     T2 DM  . Obesity Brother     250 pounds at a height of 76 inches.  . Thyroid disease Sister     Hypothyroid  . Diabetes Maternal Grandmother     Current outpatient prescriptions:ALBUTEROL IN, Inhale into the lungs.  , Disp: , Rfl: ;  Calcium Carbonate-Vitamin D (CALCIUM-VITAMIN D) 500-200 MG-UNIT per tablet, Take 1 tablet by mouth 2 (two) times daily with a meal.  , Disp: , Rfl: ;  cyclobenzaprine (FLEXERIL) 10 MG tablet, Take 1 tablet (10 mg total) by mouth 3 (three) times daily as needed for muscle spasms., Disp: 270 tablet, Rfl: 3 esomeprazole (NEXIUM) 40 MG capsule, Take 1 capsule  (40 mg total) by mouth daily before breakfast., Disp: 90 capsule, Rfl: 3;  ferrous fumarate (HEMOCYTE - 106 MG FE) 325 (106 FE) MG TABS, Take 1 tablet (106 mg of iron total) by mouth 2 (two) times daily., Disp: 180 each, Rfl: 3;  HYDROcodone-acetaminophen (VICODIN) 5-500 MG per tablet, Take 1 tablet by mouth every 6 (six) hours as needed.  , Disp: , Rfl:  metFORMIN (GLUCOPHAGE) 500 MG tablet, Take 1 tablet (500 mg total) by mouth 2 (two) times daily with a meal., Disp: 180 tablet, Rfl: 3;  Multiple Vitamin (MULTIVITAMIN) tablet, Take 1 tablet by mouth daily.  ,  Disp: , Rfl: ;  telmisartan (MICARDIS) 40 MG tablet, Take 1 tablet (40 mg total) by mouth daily., Disp: 90 tablet, Rfl: 3;  TRYPTOPHAN PO, Take by mouth.  , Disp: , Rfl:  Vitamin D, Ergocalciferol, (DRISDOL) 50000 UNITS CAPS, Take 50,000 Units by mouth every 7 (seven) days., Disp: , Rfl:   Allergies as of 09/01/2012 - Review Complete 09/01/2012  Allergen Reaction Noted  . Theophyllines Palpitations 05/13/2010    1. 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  2. Activities: She has not been swimming in long time. Her last vacation was 3 days in April.  3. Smoking, alcohol, or drugs: None 4. Primary Care Provider: Dr. Eden Emms Baxley  REVIEW OF SYSTEM: There are no other significant problems involving Alexa [name]'s other body systems.   Objective:  Vital Signs:  BP 141/97  Pulse 90  Wt 208 lb (94.348 kg)  BMI 35.69 kg/m2  Repeat BPs: 157/102, 164/105 Ht Readings from Last 3 Encounters:  11/26/11 5\' 4"  (1.626 m)  11/10/10 5\' 4"  (1.626 m)   Wt Readings from Last 3 Encounters:  09/01/12 208 lb (94.348 kg)  04/18/12 201 lb 8 oz (91.4 kg)  12/03/11 201 lb (91.173 kg)   PHYSICAL EXAM:  Constitutional: The patient appears somewhat tired and concerned. She has gained 10 pounds since last visit. As we talked about the complications of DM she began  to cry.  Face: The face appears normal.  Eyes: There is no obvious arcus or proptosis. Moisture appears normal. Mouth: The oropharynx appears normal. She has some early glossitis.  Dentition appears to be normal for age. Oral moisture is normal. Neck: The neck appears to be visibly normal. No carotid bruits are noted. The thyroid gland is slightly larger at 20-25 grams in size. The right lobe is within normal limits for site. The left lobe is moe enlarged. The consistency of the thyroid gland is normal. The thyroid gland is not tender to palpation. Lungs: The lungs are clear to auscultation. Air movement is good. Heart: Heart rate and rhythm are regular. Heart sounds S1 and S2 are normal.She has an intermittent soft S4. I did not appreciate any pathologic cardiac murmurs. Abdomen: The abdomen is enlarged. Bowel sounds are normal. There is no obvious hepatomegaly, splenomegaly, or other mass effect.  Arms: Muscle size and bulk are normal for age. Hands:  She has 1+ tremor of her hands, as before. Phalangeal and metacarpophalangeal joints are normal. Palmar muscles are normal. Palmar skin is somewhat pale. Palmar moisture is also normal. Fingernails are somewhat pale.  Legs: Muscles appear normal for age. No edema is present. Feet: Feet are normally formed. She has 2+ calluses of the balls of her feet. Dorsalis pedal pulses are normal 1+on the right and 2+ on the left.  Neurologic: Strength is normal for age in both the upper and lower extremities. Muscle tone is normal. Sensation to touch is normal in both the legs and feet.    LAB DATA: Hemoglobin A1c today was 9.1% today, compared with 7.2% at last visit and with 7.4% at the visit prior. All of these values were major increases from 6.6% in May 2012.    Labs 04/14/12: 25-hydroxy vitamin D 21, iron 203,  Vitamin B12 379,   Labs 11/16/11: Hgb 10.3, Hct 32.4%; CMP normal except glucose 146' cholesterol 201, triglycerides 70, HDL 66, LDL 121; vitamin D  22  Labs 06/09/10: Cholesterol was 175, triglycerides 76, HDL 62, LDL 98. TSH was 1.217. Free T4 was 1.10. Free T3 was 2.7. 25-hydroxy vitamin D was 19. PTH was 167.   Assessment and Plan:   ASSESSMENT:  1. Type 2 diabetes mellitus: BG control is much worse. There have been a lot of noncompliance issues. 2. Hypoglycemia: None 3. Obesity: There has been another gain in weight, equivalent to about 95 calories per day. 4. Autonomic neuropathy and tachycardia: The patient's autonomic neuropathy and  heart essentially parallel her  hemoglobin A1c, but with a lag period of several months. These problems are reversible with improved BG control. 5. Goiter: The thyroid gland is only minimally enlarged. It is not causing her difficulty in swallowing. The patient was euthyroid in May 2012. 6. Vitamin D deficiency, hypocalcemia, and secondary hyperparathyroidism: Her May 2012 value for PTH was the highest that we've seen. Since the last visit she has been non-compliant with taking her calcium and vitamin D, so it's likely that her vitamin D and calcium will be lower and her PTH will probably be higher. We will see. 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  Patients such as Alexa [name] have to work harder to take in all of her multivitamins, calcium, and vitamin D. While there have been periods of time when she has been very compliant with her medical regimen, there have also been times when she has been very non-compliant, such as the past six or more months.  7. Hypertension: Her BP is too high again. She needs to resume her telmisartan. She also needs to try to get exercise regularly. 8. Fatigue, poor sleep, hot flashes, memory problems, attention problems: These issues are all likely to be interrelated. When she was home for a week with back pain, she got more rest, was less stressed, and came back to work able to  focus and remember, even despite still having hot flashes.  A. Her life is out of balance. She is fairly burned out. She is anhedonic. She is not taking the time to take care of and cherish herself.  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 I've asked her to take time every day to nurture Alexa [name] and to have fun. I don't want to lose her to an early death. 9. Difficulty swallowing: The most likely cause of her intermittent difficulty swallowing is esophageal dysmotility, but GERD causing stricture is high on the differential diagnosis list. If she does have dysmotility,it  could be associated with stress or medications. She needs to resume Nexium. Since her difficulty in swallowing pills has been life-long, it's also possible that part of the problem could be psychological, anatomic, or a combination of both.   PLAN:  1. Diagnostic: CMP, TFTs, iron, vitamin D and PTH,  Urinary microalbumin/creatinine ratio, calcium. Come back in one week for BP check. Check BGs daily, at different times. 2. Therapeutic: Start Bydureon, 2 mg per week. Resume telmisartan, 40 mg/day. Resume omeprazole, 20 mg/day. Be as compliant as possible with all of her medications and supplements. Try to fit in exercise daily. 3. Patient education: She is living dangerously by not taking her meds and supplements. She must take her medications and supplements. 4. Follow-up: 2 months  Level of Service: This visit lasted in excess of 120 minutes. More than 50%  of the visit was devoted to counseling.   David Stall, MD 09/01/2012 3:36 PM

## 2012-09-01 NOTE — Patient Instructions (Signed)
Follow up visitin 2 months. Take your meds, PLEASE.

## 2012-09-02 ENCOUNTER — Encounter: Payer: Self-pay | Admitting: "Endocrinology

## 2012-09-02 LAB — PTH, INTACT AND CALCIUM: PTH: 253.1 pg/mL — ABNORMAL HIGH (ref 14.0–72.0)

## 2012-09-02 LAB — VITAMIN D 25 HYDROXY (VIT D DEFICIENCY, FRACTURES): Vit D, 25-Hydroxy: 20 ng/mL — ABNORMAL LOW (ref 30–89)

## 2012-09-02 LAB — MICROALBUMIN / CREATININE URINE RATIO
Creatinine, Urine: 231.2 mg/dL
Microalb Creat Ratio: 8.3 mg/g (ref 0.0–30.0)
Microalb, Ur: 1.91 mg/dL — ABNORMAL HIGH (ref 0.00–1.89)

## 2012-09-05 LAB — VITAMIN D 1,25 DIHYDROXY
Vitamin D 1, 25 (OH)2 Total: 80 pg/mL — ABNORMAL HIGH (ref 18–72)
Vitamin D3 1, 25 (OH)2: 42 pg/mL

## 2012-09-08 ENCOUNTER — Other Ambulatory Visit: Payer: Self-pay | Admitting: "Endocrinology

## 2012-09-08 DIAGNOSIS — E211 Secondary hyperparathyroidism, not elsewhere classified: Secondary | ICD-10-CM

## 2012-09-13 ENCOUNTER — Ambulatory Visit (AMBULATORY_SURGERY_CENTER): Payer: 59

## 2012-09-13 VITALS — Ht 63.0 in | Wt 209.6 lb

## 2012-09-13 DIAGNOSIS — Z1211 Encounter for screening for malignant neoplasm of colon: Secondary | ICD-10-CM

## 2012-09-13 MED ORDER — MOVIPREP 100 G PO SOLR: ORAL | Status: DC

## 2012-09-13 NOTE — Progress Notes (Signed)
Pt came into the office today for her pre-visit prior to her colonoscopy with Dr Juanda Chance on 10/04/12.After explaining the Moviprep instructions, the pt stated she could not drink a lot of liquids due to her gastric bypass surgery. Talked with Dr Regino Schultze CMA-Dottie, and the Prepopik prep was discussed with the pt.A sample of the Prepopik was given to the pt. She will call our office if she has any problems or questions with the prep.

## 2012-09-15 ENCOUNTER — Other Ambulatory Visit: Payer: Self-pay | Admitting: *Deleted

## 2012-09-15 ENCOUNTER — Telehealth: Payer: Self-pay | Admitting: "Endocrinology

## 2012-09-15 DIAGNOSIS — E559 Vitamin D deficiency, unspecified: Secondary | ICD-10-CM

## 2012-09-15 NOTE — Telephone Encounter (Signed)
PT CALLED STATING DR. Fransico Michael WAS STATING HE WILL ORDER HER AN BONE DENSITY TEST SHE CALLED TO MAKE APPT. AN OFFICE STATES IT HASN'T BEEN ORDER AN ASK MAY YOU ORDER TEST AN CONTACT HER WHEN DONE SO SHE MAY MAKE APPT.Marland Kitchen  AN ASK THAT YOU USE RETURN CALL NUMBER ONLY  FOR TODAY WHEN ORDER IS PUT IN [phone]

## 2012-09-26 HISTORY — PX: COLONOSCOPY: SHX174

## 2012-10-04 ENCOUNTER — Encounter: Payer: Self-pay | Admitting: Internal Medicine

## 2012-10-04 ENCOUNTER — Ambulatory Visit (AMBULATORY_SURGERY_CENTER): Payer: 59 | Admitting: Internal Medicine

## 2012-10-04 VITALS — BP 131/89 | HR 66 | Temp 97.0°F | Resp 14 | Ht 63.0 in | Wt 209.0 lb

## 2012-10-04 DIAGNOSIS — Z1211 Encounter for screening for malignant neoplasm of colon: Secondary | ICD-10-CM

## 2012-10-04 LAB — GLUCOSE, CAPILLARY: Glucose-Capillary: 210 mg/dL — ABNORMAL HIGH (ref 70–99)

## 2012-10-04 MED ORDER — SODIUM CHLORIDE 0.9 % IV SOLN: 500.0000 mL | INTRAVENOUS | Status: DC

## 2012-10-04 NOTE — Progress Notes (Signed)
Pt had clear output with small green sediment after using enema.

## 2012-10-04 NOTE — Progress Notes (Signed)
Lidocaine-40mg IV prior to Propofol InductionPropofol given over incremental dosages 

## 2012-10-04 NOTE — Progress Notes (Signed)
Patient did not have preoperative order for IV antibiotic SSI prophylaxis. (G8918)  Patient did not experience any of the following events: a burn prior to discharge; a fall within the facility; wrong site/side/patient/procedure/implant event; or a hospital transfer or hospital admission upon discharge from the facility. (G8907)  

## 2012-10-04 NOTE — Progress Notes (Signed)
Patient states she did not have a bowel movement this morning. The last one she had last night was clear but she did have some solid stool in it. Fleets enema given. Alexa [name], [place]

## 2012-10-04 NOTE — Patient Instructions (Addendum)

## 2012-10-04 NOTE — Op Note (Signed)
[facility] [address] [place], [address]   COLONOSCOPY PROCEDURE REPORT  PATIENT: Alexa [name], Alexa [name]  MR#: [id number] BIRTHDATE: 03-15-1960 , 52  yrs. old GENDER: Female ENDOSCOPIST: Hart Carwin, MD REFERRED XB:JYNW Waymond Cera, M.D. PROCEDURE DATE:  10/04/2012 PROCEDURE:   Colonoscopy, screening First Screening Colonoscopy - Avg.  risk and is 50 yrs.  old or older Yes.  Prior Negative Screening - Now for repeat screening. N/A  History of Adenoma - Now for follow-up colonoscopy & has been > or = to 3 yrs.  N/A  Polyps Removed Today? No.  Recommend repeat exam, <10 yrs? Yes.  Inadequate prep. ASA CLASS:   Class II INDICATIONS:Average risk patient for colon cancer. MEDICATIONS: MAC sedation, administered by CRNA and propofol (Diprivan) 250mg  IV  DESCRIPTION OF PROCEDURE:   After the risks benefits and alternatives of the procedure were thoroughly explained, informed consent was obtained.  A digital rectal exam revealed no abnormalities of the rectum.   The     endoscope was introduced through the anus and advanced to the cecum, which was identified by both the appendix and ileocecal valve. No adverse events experienced.   The quality of the prep was Moviprep fair  The instrument was then slowly withdrawn as the colon was fully examined.      COLON FINDINGS: A normal appearing cecum, ileocecal valve, and appendiceal orifice were identified.  The ascending, hepatic flexure, transverse, splenic flexure, descending, sigmoid colon and rectum appeared unremarkable.  No polyps or cancers were seen. Small hemorrhoids were found.  Retroflexed views revealed no abnormalities. The time to cecum=4 minutes 50 seconds.  Withdrawal time=6 minutes 10 seconds.  The scope was withdrawn and the procedure completed. COMPLICATIONS: There were no complications.  ENDOSCOPIC IMPRESSION: 1.   Normal colon 2.   Small hemorrhoids 3  .suboptomal prep  RECOMMENDATIONS: 1.   High fiber diet 2.   recommend 2 day prep next time, recall 5 years   eSigned:  Hart Carwin, MD 10/04/2012 9:09 AM   cc:   PATIENT NAME:  Alexa [name], Alexa [name] MR#: [id number]

## 2012-10-05 ENCOUNTER — Telehealth: Payer: Self-pay | Admitting: *Deleted

## 2012-10-05 NOTE — Telephone Encounter (Signed)
  Follow up Call-  Call back number 10/04/2012  Post procedure Call Back phone  # home [phone]  Permission to leave phone message Yes    [facility]

## 2012-10-17 ENCOUNTER — Other Ambulatory Visit: Payer: Self-pay | Admitting: "Endocrinology

## 2012-10-17 ENCOUNTER — Ambulatory Visit (HOSPITAL_COMMUNITY)
Admission: RE | Admit: 2012-10-17 | Discharge: 2012-10-17 | Disposition: A | Discharge status: Home / Self Care | Payer: 59 | Source: Ambulatory Visit | Attending: "Endocrinology | Admitting: "Endocrinology

## 2012-10-17 DIAGNOSIS — E559 Vitamin D deficiency, unspecified: Secondary | ICD-10-CM

## 2012-10-17 DIAGNOSIS — Z13820 Encounter for screening for osteoporosis: Secondary | ICD-10-CM | POA: Insufficient documentation

## 2012-10-17 DIAGNOSIS — Z780 Asymptomatic menopausal state: Secondary | ICD-10-CM | POA: Insufficient documentation

## 2012-11-01 ENCOUNTER — Ambulatory Visit: Payer: 59 | Admitting: "Endocrinology

## 2012-11-02 ENCOUNTER — Ambulatory Visit: Payer: 59 | Admitting: "Endocrinology

## 2012-11-02 ENCOUNTER — Telehealth: Payer: Self-pay | Admitting: "Endocrinology

## 2012-11-02 NOTE — Telephone Encounter (Signed)
1. I contacted the patient at work. 2. Her BMD study was better than I expected. The study showed osteopenia of the spine, but normal BMD of the hip.  3. I asked her to take Citracal/D at lunch and at supper. Will repeat her calcium, PTH, and 25-OH vitamin D prior to her next visit in November.  David Stall

## 2012-11-07 ENCOUNTER — Other Ambulatory Visit: Payer: Self-pay | Admitting: *Deleted

## 2012-11-07 DIAGNOSIS — M8580 Other specified disorders of bone density and structure, unspecified site: Secondary | ICD-10-CM

## 2012-12-01 ENCOUNTER — Other Ambulatory Visit: Payer: Self-pay

## 2013-01-04 ENCOUNTER — Ambulatory Visit: Payer: 59 | Admitting: "Endocrinology

## 2013-01-05 ENCOUNTER — Encounter: Payer: Self-pay | Admitting: "Endocrinology

## 2013-01-05 ENCOUNTER — Ambulatory Visit (INDEPENDENT_AMBULATORY_CARE_PROVIDER_SITE_OTHER): Payer: 59 | Admitting: "Endocrinology

## 2013-01-05 VITALS — Wt 205.8 lb

## 2013-01-05 DIAGNOSIS — IMO0001 Reserved for inherently not codable concepts without codable children: Secondary | ICD-10-CM

## 2013-01-05 DIAGNOSIS — G909 Disorder of the autonomic nervous system, unspecified: Secondary | ICD-10-CM

## 2013-01-05 DIAGNOSIS — E1149 Type 2 diabetes mellitus with other diabetic neurological complication: Secondary | ICD-10-CM

## 2013-01-05 DIAGNOSIS — E1143 Type 2 diabetes mellitus with diabetic autonomic (poly)neuropathy: Secondary | ICD-10-CM

## 2013-01-05 DIAGNOSIS — E162 Hypoglycemia, unspecified: Secondary | ICD-10-CM

## 2013-01-05 DIAGNOSIS — F32A Depression, unspecified: Secondary | ICD-10-CM

## 2013-01-05 DIAGNOSIS — F341 Dysthymic disorder: Secondary | ICD-10-CM

## 2013-01-05 DIAGNOSIS — F4310 Post-traumatic stress disorder, unspecified: Secondary | ICD-10-CM

## 2013-01-05 DIAGNOSIS — R5383 Other fatigue: Secondary | ICD-10-CM

## 2013-01-05 DIAGNOSIS — R5381 Other malaise: Secondary | ICD-10-CM

## 2013-01-05 DIAGNOSIS — F329 Major depressive disorder, single episode, unspecified: Secondary | ICD-10-CM

## 2013-01-05 DIAGNOSIS — I10 Essential (primary) hypertension: Secondary | ICD-10-CM

## 2013-01-05 LAB — GLUCOSE, POCT (MANUAL RESULT ENTRY): POC Glucose: 301 mg/dl — AB (ref 70–99)

## 2013-01-05 LAB — POCT GLYCOSYLATED HEMOGLOBIN (HGB A1C): Hemoglobin A1C: 9.3

## 2013-01-05 NOTE — Progress Notes (Signed)
Subjective:  Patient Name: Alexa [name] Date of Birth: 02/23/1960  MRN: [id number]  Alexa [name]  presents to the office today for follow-up of her type 2 diabetes mellitus, obesity, combined hyperlipidemia, GERD, hypertension, ASHD, dyspepsia, pedal edema, non-proliferative diabetic retinopathy, goiter, depression, autonomic neuropathy, tachycardia, peripheral neuropathy, vitamin D deficiency, hypocalcemia, secondary hyperparathyroidism, glossitis, pallor, fatigue, iron deficiency anemia, and status post Roux-en-Y gastric bypass.  HISTORY OF PRESENT ILLNESS:   Alexa [name] is a 52 y.o. Caucasian woman. Alexa [name] was unaccompanied.   1. The patient first presented to me on 08/20/04 in referral from her primary care internist, Dr. Sharlet Salina, for evaluation and management of type 2 diabetes, obesity, and multiple medical issues. She was 52 years old.   A. Patient had a long history obesity. She was heavy as a child. She underwent menarche at age 37. At age 35 she was 180 pounds. In 1996 she weighed 218 pounds. In 1999 she was diagnosed with type 2 diabetes mellitus. Her weight at that point was 250 pounds. She was treated with Glucophage, and Actos. Actos made her gain weight. She has also been treated with glipizide in the past. More recently she had been treated with Lantus and Glucophage plus regular insulin as needed for when she took steroids for asthma attacks. Maximum weight had been 296 pounds one month prior to this visit. Her tendency to gain weight and her difficulty in losing weight were aggravated by long-standing, intermittent depression and by severe, recurrent asthma requiring the use of steroid medications.  B. Past medical history was also positive for a 60% blockage in one of her coronary arteries. She had significant issues with GERD and dyspepsia. She also had combined hyperlipidemia. She had a previous cardiac catheterization and previous tonsillectomy. She was allergic to  theophylline. Her pertinent review of systems was positive for some numbness and tingling in her feet. Family history was positive for type 2 diabetes in her father and her maternal grandmother. Her brother weighed 250 pounds at a height of 76 inches. Both her mother and her sister were hypothyroid.  C. On physical examination, her weight was 279.9 pounds. Her BMI was 49.6. Her blood pressure was 142/86. Her heart rate was 96. Her hemoglobin A1c was 9.8%. She was alert and oriented x3. Her affect was normal. Her insight was fair. She was obviously quite heavy. She had a 25 g thyroid gland. She had 1+ tremor of her hands. She had 1+ DP pulses and 2+ tinea pedis. Sensation to touch was intact in her feet. Laboratory data included a normal CMP. Her cholesterol was 175, triglycerides 76, HDL 53, and LDL 107. Her TSH was 2.98. Since she obviously did have type 2 diabetes mellitus associated with morbid obesity, and since weight loss was a major factor for her, I asked her to resume her metformin twice daily. I also started her on Byetta, initially 5 mcg twice daily and then 10 mcg twice daily. I discontinued her Lantus insulin.  2. During the last 8 years,we have had some major successes and some new problem areas.  A. T2DM: In October 2007 the combination of Byetta and metformin was causing more gastrointestinal problems. Patient opted to stop the metformin and continue the Byetta because it was helping her with weight control and blood sugar control. By 08/11/06 her weight had decreased to 267.6 pounds. Hemoglobin A1c was 8.6%. At that point she decided to have bariatric surgery. She had a Roux-en-Y gastric bypass on 12/20/2006. She subsequently  lost weight down to 173.4 pounds on 05/23/08, but then subsequently regained weight to the 190s. Her  hemoglobin A1c values varied in parallel with her weight. On 05/23/08, at the point of her lowest weight, her hemoglobin A1c dropped to a nadir of 6.3%.  Since then the  hemoglobin A1c values have varied between 6.6 and 9.1%. Although we were initially able to stop all of her diabetes medicines after surgery, when she began to regain weight we restarted metformin, 500 mg twice daily. Although she took her metformin twice daily for a long time, she has discontinued it since last visit.   B. Vitamin D deficiency, hypocalcemia, and secondary hyperparathyroidism: Just before her gastric bypass, I obtained baseline bone mineral metabolism studies.. Her 25-hydroxy vitamin D was 7 (normal greater than or equal to 30). Her calcium was 9.5 (normal 8.6-10.6). Her parathyroid hormone was 38.4 (normal 14-72). Subsequent to her surgery, the patient was supposed to be taking multivitamins with calcium and vitamin D, but did not always do so. On 03/29/2007 her 25-hydroxy vitamin D was 29, but her calcium decreased to 8.7. Her PTH was slightly elevated at 73.5. Her 1,25-hydroxy vitamin D was 46. Her iron was 56. I asked her to make sure she took her multivitamins and calcium daily. Unfortunately, she has not always been compliant with her multivitamins and calcium. Her 25-hydroxy vitamin D values have varied between 19-27. Her calcium values have varied between 8.1-9.4. Her PTH values have remained elevated between 82.9-167. The patient's iron levels have also been low, between 32-34. She is supposed to be taking iron every day as well. Unfortunately, since last visit she has discontinued all vitamins and supplements.   3. The patient's last [facility] visit was on 09/01/12.  A. In the interim, she has not had any new illnesses, but remains very tired, has some chest pains, and has horrible leg cramps. She sleeps okay. She is trying to drink more water. She did not start Bydureon because of fears of nausea. She will be off all nursing and church responsibilities for the next 10 days, so plans to start it then. Unfortunately, she has so many family responsibilities that she may not start the drug even  then.    B. Since we talked on 11/02/12 she took citracal-D twice daily for two weeks, then once daily since then. She also take 1-2 calcium chews once daily.   C. Her "horrible leg cramps" occur 2-3 times per week.   D. She continues to feel stressed, scattered, and overwhelmed at work and at home.. She does feel more anxious. She does not feel as terribly depressed as she has before. She is, however, fairly anhedonic, perhaps because she is so tired. She has had years of therapy from age 74 to her mid-30s, for her self-care issues, non-compliance, PTSD, and anxiety and depression. She has never been compliant with medications, to include psych meds.   F. She has also been having dental problems.  G. She is also having hot flashes. Dr. Lenord Fellers put her on an OCP, which helped the hot flashes and her mental fog, but she stopped taking it after a while.  4. Pertinent Review of Systems:  Constitutional: The patient feels tired and overwhelmed. She still craves sweets and salt. She is not having many regular soft drinks or sweet tea. She has not been doing much exercise.  Eyes: Vision is good. There are no significant eye complaints. She had an eye exam in April or May. That exam  showed no signs of diabetic retinopathy. Neck: For at least the past 2 years she has noted more frequent difficulty swallowing solids, especially meat and hard breads.  She has had similar symptoms intermittently before, sometimes lasting for several weeks. She has no complaints of anterior neck swelling, soreness, tenderness,  pressure, or discomfort.  Heart: Heart rate increases with exercise or other physical activity. She occasionally has costochondritic pains. She has no complaints of palpitations, irregular heat beats, other chest pain, or chest pressure. Gastrointestinal: She has occasional difficulty swallowing as above. She rarely has problems with reflux. She infrequently feels something uncomfortable in her left upper  ribcage. Abdomen has not been tender to palpation. Bowel movents seem normal. The patient has no complaints of excessive hunger, acid reflux, upset stomach, stomach aches or pains, diarrhea, or constipation. Legs: Muscle mass and strength seem normal. Leg cramps are a big problem as noted above. There are no other complaints of numbness, tingling, burning, or pain. No edema is noted. Feet: There are no obvious foot problems. There are no complaints of numbness, tingling, burning, or pain. No edema is noted. Psychological: She states that she is not depressed, just tired and overwhelmed. There is not much joy in her life.  Hypoglycemia: None  4. BG report: She has not been checking her BGs.    PAST MEDICAL, FAMILY, AND SOCIAL HISTORY:  Past Medical History  Diagnosis Date  . Diabetes mellitus   . Asthma   . Hypertension   . GERD (gastroesophageal reflux disease)   . CAD (coronary artery disease)   . Elevated homocysteine   . Chronic insomnia   . Diabetes mellitus type II   . Obesity, Class III, BMI 40-49.9 (morbid obesity)   . Combined hyperlipidemia   . Asthma, chronic   . GERD (gastroesophageal reflux disease)   . Dyspepsia   . Nonproliferative diabetic retinopathy associated with type 2 diabetes mellitus     RESOLVED  . Goiter   . Depression   . Diabetic autonomic neuropathy   . DM type 2 with diabetic peripheral neuropathy   . Vitamin D deficiency   . Hyperparathyroidism , secondary, non-renal   . Tachycardia   . Glossitis   . Thyroiditis, autoimmune   . Fatigue   . Pallor   . Anemia, iron deficiency   . Essential tremor     Family History  Problem Relation Age of Onset  . Thyroid disease Mother     Hypothyroid  . Diabetes Father     T2 DM  . Obesity Brother     250 pounds at a height of 76 inches.  . Thyroid disease Sister     Hypothyroid  . Diabetes Maternal Grandmother     Current outpatient prescriptions:Calcium Carbonate-Vitamin D (CALCIUM-VITAMIN D)  500-200 MG-UNIT per tablet, Take 1 tablet by mouth 2 (two) times daily with a meal.  , Disp: , Rfl: ;  cyclobenzaprine (FLEXERIL) 10 MG tablet, Take 1 tablet (10 mg total) by mouth 3 (three) times daily as needed for muscle spasms., Disp: 270 tablet, Rfl: 3;  Multiple Vitamin (MULTIVITAMIN) tablet, Take 1 tablet by mouth daily.  , Disp: , Rfl:  ALBUTEROL IN, Inhale into the lungs.  , Disp: , Rfl: ;  esomeprazole (NEXIUM) 40 MG capsule, Take 1 capsule (40 mg total) by mouth daily before breakfast., Disp: 90 capsule, Rfl: 3;  Exenatide ER (BYDUREON) 2 MG SUSR, Inject 2 mg into the skin once a week., Disp: 12 each, Rfl: 3;  ferrous  fumarate (HEMOCYTE - 106 MG FE) 325 (106 FE) MG TABS tablet, Take 1 tablet by mouth daily., Disp: , Rfl:  HYDROcodone-acetaminophen (VICODIN) 5-500 MG per tablet, Take 1 tablet by mouth every 6 (six) hours as needed.  , Disp: , Rfl: ;  metFORMIN (GLUCOPHAGE) 500 MG tablet, Take 1 tablet (500 mg total) by mouth 2 (two) times daily with a meal., Disp: 180 tablet, Rfl: 3;  telmisartan (MICARDIS) 40 MG tablet, Take 1 tablet (40 mg total) by mouth daily., Disp: 90 tablet, Rfl: 3;  TRYPTOPHAN PO, Take by mouth. Take 4 tablets ay bedtime, Disp: , Rfl:   Allergies as of 01/05/2013 - Review Complete 01/05/2013  Allergen Reaction Noted  . Theophyllines Palpitations 05/13/2010    1. 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  2. Activities: She has not been swimming in long time. Her last vacation was 3 days in April.  3. Smoking, alcohol, or drugs: None 4. Primary Care Provider: Dr. Eden Emms Baxley  REVIEW OF SYSTEM: There are no other significant problems involving Shireen's other body systems.   Objective:  Vital Signs:  Wt 205 lb 12.8 oz (93.35 kg)  LMP 05/11/2010  Repeat BPs: 157/102, 164/105 Ht Readings from Last 3 Encounters:  10/04/12 5\' 3"  (1.6 m)  09/13/12 5\' 3"  (1.6 m)   11/26/11 5\' 4"  (1.626 m)   Wt Readings from Last 3 Encounters:  01/05/13 205 lb 12.8 oz (93.35 kg)  10/04/12 209 lb (94.802 kg)  09/13/12 209 lb 9.6 oz (95.074 kg)   PHYSICAL EXAM:  Constitutional: Within moments of her arrival it was apparent that she was very sad and tearful. She discussed her past emotional and functional history and problems at great length. She  cried openly at many points in the visit as she recalled her struggles to function normally. Since it was apparent that she really needed to talk about and to work on her emotional problems today, we spent the entire visit discussing these issues. She shared many thoughts and feelings today that she has never shared with me before. The remainder of her physical exam and DM evaluation was deferred.   LAB DATA: Hemoglobin A1c today was 9.3% today, compared with 9.1 at last visit and with 7.2% at the visit prior. All of these values were major increases from 6.6% in May 2012.    Labs 04/14/12: 25-hydroxy vitamin D 21, iron 203,  Vitamin B12 379,   Labs 11/16/11: Hgb 10.3, Hct 32.4%; CMP normal except glucose 146' cholesterol 201, triglycerides 70, HDL 66, LDL 121; vitamin D 22           Labs 06/09/10: Cholesterol was 175, triglycerides 76, HDL 62, LDL 98. TSH was 1.217. Free T4 was 1.10. Free T3 was 2.7. 25-hydroxy vitamin D was 19. PTH was 167.   Assessment and Plan:   ASSESSMENT:  1. Type 2 diabetes mellitus: BG control is somewhat worse. There have been a lot of noncompliance/non-adherence issues. The major barriers to care are her anxiety, depression, and PTSD. 2. Hypoglycemia: None 3. Obesity: She has lost 4 pounds since last visit.  4. Autonomic neuropathy and tachycardia: The patient's autonomic neuropathy and  heart essentially parallel her hemoglobin A1c, but with a lag period of several months. These problems are reversible with improved BG control. 5. Goiter: The patient was euthyroid in May 2012. 6. Vitamin D  deficiency, hypocalcemia, and secondary  hyperparathyroidism: Her May 2012 value for PTH was the highest that we've seen. Since the last visit she has been more compliant with taking her calcium and vitamin D, so it's likely that her vitamin D and calcium will be higher and her PTH will probably be lower. We will see. 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  Patients such as Ms. Iannuzzi have to work harder to take in all of her multivitamins, calcium, and vitamin D. While there have been periods of time when she has been very compliant with her medical regimen, there have also been times when she has been very non-compliant, such as the past six or more months.  7. Hypertension: Her BP is too high again. She needs to resume her telmisartan. She also needs to try to get exercise regularly. 8. Fatigue, poor sleep, hot flashes, memory problems, attention problems: These issues are all likely to be interrelated. When she was home for a week with back pain, she got more rest, was less stressed, and came back to work able to focus and remember, even despite still having hot flashes.  A. Her life is out of balance. She is fairly burned out. She is anhedonic. She is not taking the time to take care of and cherish herself.  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 I've asked her to take time every day to nurture Sapphira and to have fun. I don't want to lose her to an early death. 9. Difficulty swallowing: These problems persist. The most likely cause of her intermittent difficulty swallowing is esophageal dysmotility, but GERD causing stricture is high on the differential diagnosis list. If she does have dysmotility, it  could be associated with stress or medications. Since her difficulty in swallowing pills has been life-long, it's  also possible that part of the problem could be psychological, anatomic, or a combination of both. She needs to be evaluated by GI. 10. Anxiety, depression, and PTSD: These are her major barriers to care. Part of these problems is due to genetics, part to bad life experiences, and part to the stresses of daily living in a pressured environment. She really needs help.  PLAN:  1. Diagnostic/Therapeutic: Contact Ms. Fransico Setters and Dr. Colvin Caroli to try to obtain referrals to a site where she can obtain integrated psychiatry and psychology/therapy care. Dr. Lenord Fellers may have other suggestions for places where Sophiagrace can obtain integrated mental health support. 2. Therapeutic: Start Bydureon, 2 mg per week. Be as compliant as possible with all of her medications and supplements. Try to fit in exercise daily. 3. Patient education: She must take her medications and supplements. 4. Follow-up: 6 weeks  Level of Service: This visit lasted in excess of 80 minutes. More than 50% of the visit was devoted to counseling.   David Stall, MD 01/05/2013 1:40 PM

## 2013-01-05 NOTE — Patient Instructions (Signed)
Follow up visit 4 weeks.  

## 2013-01-06 DIAGNOSIS — F329 Major depressive disorder, single episode, unspecified: Secondary | ICD-10-CM | POA: Insufficient documentation

## 2013-01-06 DIAGNOSIS — F4310 Post-traumatic stress disorder, unspecified: Secondary | ICD-10-CM | POA: Insufficient documentation

## 2013-01-06 DIAGNOSIS — F418 Other specified anxiety disorders: Secondary | ICD-10-CM | POA: Insufficient documentation

## 2013-02-23 ENCOUNTER — Encounter: Payer: Self-pay | Admitting: "Endocrinology

## 2013-02-23 ENCOUNTER — Ambulatory Visit (INDEPENDENT_AMBULATORY_CARE_PROVIDER_SITE_OTHER): Payer: 59 | Admitting: "Endocrinology

## 2013-02-23 VITALS — BP 153/94 | HR 90 | Wt 203.0 lb

## 2013-02-23 DIAGNOSIS — R5383 Other fatigue: Secondary | ICD-10-CM

## 2013-02-23 DIAGNOSIS — R5381 Other malaise: Secondary | ICD-10-CM

## 2013-02-23 DIAGNOSIS — I10 Essential (primary) hypertension: Secondary | ICD-10-CM

## 2013-02-23 DIAGNOSIS — F4323 Adjustment disorder with mixed anxiety and depressed mood: Secondary | ICD-10-CM

## 2013-02-23 DIAGNOSIS — F329 Major depressive disorder, single episode, unspecified: Secondary | ICD-10-CM

## 2013-02-23 DIAGNOSIS — F3289 Other specified depressive episodes: Secondary | ICD-10-CM

## 2013-02-23 DIAGNOSIS — F32A Depression, unspecified: Secondary | ICD-10-CM

## 2013-02-23 DIAGNOSIS — E559 Vitamin D deficiency, unspecified: Secondary | ICD-10-CM

## 2013-02-23 DIAGNOSIS — E7800 Pure hypercholesterolemia, unspecified: Secondary | ICD-10-CM

## 2013-02-23 DIAGNOSIS — E049 Nontoxic goiter, unspecified: Secondary | ICD-10-CM

## 2013-02-23 DIAGNOSIS — E1165 Type 2 diabetes mellitus with hyperglycemia: Principal | ICD-10-CM

## 2013-02-23 DIAGNOSIS — IMO0001 Reserved for inherently not codable concepts without codable children: Secondary | ICD-10-CM

## 2013-02-23 LAB — GLUCOSE, POCT (MANUAL RESULT ENTRY): POC GLUCOSE: 262 mg/dL — AB (ref 70–99)

## 2013-02-23 LAB — T4, FREE: Free T4: 1.29 ng/dL (ref 0.80–1.80)

## 2013-02-23 LAB — COMPREHENSIVE METABOLIC PANEL
ALT: 15 U/L (ref 0–35)
AST: 17 U/L (ref 0–37)
Albumin: 4.3 g/dL (ref 3.5–5.2)
Alkaline Phosphatase: 126 U/L — ABNORMAL HIGH (ref 39–117)
BILIRUBIN TOTAL: 0.5 mg/dL (ref 0.2–1.2)
BUN: 12 mg/dL (ref 6–23)
CALCIUM: 8.9 mg/dL (ref 8.4–10.5)
CHLORIDE: 99 meq/L (ref 96–112)
CO2: 25 meq/L (ref 19–32)
CREATININE: 0.75 mg/dL (ref 0.50–1.10)
GLUCOSE: 194 mg/dL — AB (ref 70–99)
Potassium: 3.7 mEq/L (ref 3.5–5.3)
Sodium: 136 mEq/L (ref 135–145)
Total Protein: 6.9 g/dL (ref 6.0–8.3)

## 2013-02-23 LAB — LIPID PANEL
CHOL/HDL RATIO: 2.6 ratio
CHOLESTEROL: 156 mg/dL (ref 0–200)
HDL: 61 mg/dL (ref >39)
LDL Cholesterol: 82 mg/dL (ref 0–99)
TRIGLYCERIDES: 64 mg/dL (ref <150)
VLDL: 13 mg/dL (ref 0–40)

## 2013-02-23 LAB — T3, FREE: T3, Free: 3.2 pg/mL (ref 2.3–4.2)

## 2013-02-23 LAB — TSH: TSH: 2.086 u[IU]/mL (ref 0.350–4.500)

## 2013-02-23 NOTE — Progress Notes (Signed)
Subjective:  Patient Name: Alexa [name] Date of Birth: October 30, 1960  MRN: [id number]  Alexa [name]  presents to the office today for follow-up of her type 2 diabetes mellitus, obesity, combined hyperlipidemia, GERD, hypertension, ASHD, dyspepsia, pedal edema, non-proliferative diabetic retinopathy, goiter, depression, autonomic neuropathy, tachycardia, peripheral neuropathy, vitamin D deficiency, hypocalcemia, secondary hyperparathyroidism, glossitis, pallor, fatigue, iron deficiency anemia, and status post Roux-en-Y gastric bypass.   HISTORY OF PRESENT ILLNESS:   Alexa [name] is a 53 y.o. Caucasian woman. Alexa [name] was unaccompanied.   1. The patient first presented to me on 08/20/04 in referral from her primary care internist, Dr. Emeline General, for evaluation and management of type 2 diabetes, obesity, and multiple medical issues. She was 53 years old.   A. Patient had a long history obesity. She was heavy as a child. She underwent menarche at age 65. At age 36 she was 180 pounds. In 1996 she weighed 218 pounds. In 1999 she was diagnosed with type 2 diabetes mellitus. Her weight at that point was 250 pounds. She was treated with Glucophage, and Actos. Actos made her gain even more weight. She has also been treated with glipizide in the past. More recently she had been treated with Lantus and Glucophage plus regular insulin as needed for when she took steroids for asthma attacks. Maximum weight had been 296 pounds one month prior to this visit. Her tendency to gain weight and her difficulty in losing weight were aggravated by long-standing, intermittent depression and by severe, recurrent asthma requiring the use of steroid medications.  B. Past medical history was also positive for a 60% blockage in one of her coronary arteries. She had significant issues with GERD and dyspepsia. She also had combined hyperlipidemia. She had a previous cardiac catheterization and previous tonsillectomy. She was allergic to  theophylline. Her pertinent review of systems was positive for some numbness and tingling in her feet. Family history was positive for type 2 diabetes in her father and her maternal grandmother. Her brother weighed 250 pounds at a height of 76 inches. Both her mother and her sister were hypothyroid.  C. On physical examination, her weight was 279.9 pounds. Her BMI was 49.6. Her blood pressure was 142/86. Her heart rate was 96. Her hemoglobin A1c was 9.8%. She was alert and oriented x3. Her affect was normal. Her insight was fair. She was obviously quite heavy. She had a 25 gm thyroid gland. She had 1+ tremor of her hands. She had 1+ DP pulses and 2+ tinea pedis. Sensation to touch was intact in her feet. Laboratory data included a normal CMP. Her cholesterol was 175, triglycerides 76, HDL 53, and LDL 107. Her TSH was 2.98. Since she obviously did have type 2 diabetes mellitus associated with morbid obesity, and since weight loss was a major factor for her, I asked her to resume her metformin twice daily. I also started her on Byetta, initially 5 mcg twice daily and then 10 mcg twice daily. I discontinued her Lantus insulin.  2. During the last 8 years,we have had some major successes and some new problem areas.  A. T2DM: In October 2007 the combination of Byetta and metformin was causing more gastrointestinal problems. Patient opted to stop the metformin and continue the Byetta because it was helping her with weight control and blood sugar control. By 08/11/06 her weight had decreased to 267.6 pounds. Hemoglobin A1c was 8.6%. At that point she decided to have bariatric surgery. She had a Roux-en-Y gastric bypass on  12/20/2006. She subsequently lost weight down to 173.4 pounds on 05/23/08, but then subsequently regained weight to the 190s. Her  hemoglobin A1c values varied in parallel with her weight. On 05/23/08, at the point of her lowest weight, her hemoglobin A1c dropped to a nadir of 6.3%.  Since then her  hemoglobin A1c values have varied between 6.6 and 9.3%. Although we were initially able to stop all of her diabetes medicines after bariatric surgery, when she began to regain weight we restarted metformin, 500 mg twice daily. Although she took her metformin twice daily for a long time, she has discontinued it many months ago. She hates to take medicines.   B. Vitamin D deficiency, hypocalcemia, and secondary hyperparathyroidism: Just before her gastric bypass, I obtained baseline bone mineral metabolism studies.. Her 25-hydroxy vitamin D was 7 (normal greater than or equal to 30). Her calcium was 9.5 (normal 8.6-10.6). Her parathyroid hormone was 38.4 (normal 14-72). Subsequent to her surgery, the patient was supposed to be taking multivitamins with calcium and vitamin D, but did not always do so. On 03/29/2007 her 25-hydroxy vitamin D was 29, but her calcium decreased to 8.7. Her PTH was slightly elevated at 73.5. Her 1,25-hydroxy vitamin D was 46. Her iron was 56. I asked her to make sure she took her multivitamins and calcium daily. Unfortunately, she has not always been compliant with her multivitamins and calcium. Her 25-hydroxy vitamin D values have varied between 19-27. Her calcium values have varied between 8.1-9.4. Her PTH values have remained elevated between 82.9-167. The patient's iron levels have also been low, between 32-34. She is supposed to be taking iron every day as well. Unfortunately, since  August of 2014 she has discontinued all vitamins and supplements.   3. The patient's last [facility] visit was on 01/05/13.  A. In the interim, she has not had any new illnesses, but remains very tired, has some chest pains, and has horrible leg cramps. She sleeps okay. She is trying to drink more water. She did not start Bydureon because of fears of nausea.   B. She will have her first consultative visit with Ms. Joya Martyr, MS later today. Zuleica now recognizes how little joy she has in her life.  C.  She has taken some of the Viactive calcium chews. She will buy some Citracal-D tablets, to be taken twice daily for two weeks, then once daily thereafter. She takes her tryptophan occasionally, but has not been taking her prescription medications.   D.She and her husband will join the [employer].  E. She continues to feel anxious and stressed at work and at home. She does feel more anxious. She does not feel as terribly depressed as she has before. She is, however, fairly anhedonic. She has never been compliant with medications, to include psych meds.   F. She has not been having dental problems.  G. She stopped the OCPs that Dr. Renold Genta put her on to treat her hot flashes. She is having more hot flashes now.  4. Pertinent Review of Systems:  Constitutional: The patient feels "very tired" after working 6 of the past 8 days, with shifts lasting more than 12 hours. She is still probably taking in more carbs than she needs and is not exercising.   Eyes: Vision is good. There are no significant eye complaints. She had an eye exam in April or May 2014. That exam showed no signs of diabetic retinopathy. Neck: For at least the past 2 years she has  noted more frequent difficulty swallowing solids, especially meat and hard breads.  She has had similar symptoms intermittently before, sometimes lasting for several weeks. She has no complaints of anterior neck swelling, soreness, tenderness,  pressure, or discomfort.  Heart: Heart rate increases with exercise or other physical activity. She occasionally has costochondritic pains. She has no complaints of palpitations, irregular heat beats, other chest pain, or chest pressure. Gastrointestinal: She has occasional difficulty swallowing as above. She rarely has problems with reflux. She infrequently feels something uncomfortable in her left upper ribcage. Abdomen has not been tender to palpation. Bowel movents seem normal. The patient has no complaints of  excessive hunger, acid reflux, upset stomach, stomach aches or pains, diarrhea, or constipation. Legs: Muscle mass and strength seem normal. Leg cramps are a big problem as noted above. There are no other complaints of numbness, tingling, burning, or pain. She occasionally has mild edema. Feet: There are no obvious foot problems. There are no complaints of numbness, tingling, burning, or pain. No edema is noted. Psychological: She states that she is depressed, tired and overwhelmed. There is not much joy in her life.  Hypoglycemia: None  4. BG report: She has checked BGs 6 times in the past week. BGs vary from 177-267.   PAST MEDICAL, FAMILY, AND SOCIAL HISTORY:  Past Medical History  Diagnosis Date  . Diabetes mellitus   . Asthma   . Hypertension   . GERD (gastroesophageal reflux disease)   . CAD (coronary artery disease)   . Elevated homocysteine   . Chronic insomnia   . Diabetes mellitus type II   . Obesity, Class III, BMI 40-49.9 (morbid obesity)   . Combined hyperlipidemia   . Asthma, chronic   . GERD (gastroesophageal reflux disease)   . Dyspepsia   . Nonproliferative diabetic retinopathy associated with type 2 diabetes mellitus     RESOLVED  . Goiter   . Depression   . Diabetic autonomic neuropathy   . DM type 2 with diabetic peripheral neuropathy   . Vitamin D deficiency   . Hyperparathyroidism , secondary, non-renal   . Tachycardia   . Glossitis   . Thyroiditis, autoimmune   . Fatigue   . Pallor   . Anemia, iron deficiency   . Essential tremor     Family History  Problem Relation Age of Onset  . Thyroid disease Mother     Hypothyroid  . Diabetes Father     T2 DM  . Obesity Brother     250 pounds at a height of 76 inches.  . Thyroid disease Sister     Hypothyroid  . Diabetes Maternal Grandmother     Current outpatient prescriptions:cyclobenzaprine (FLEXERIL) 10 MG tablet, Take 1 tablet (10 mg total) by mouth 3 (three) times daily as needed for muscle  spasms., Disp: 270 tablet, Rfl: 3;  ALBUTEROL IN, Inhale into the lungs.  , Disp: , Rfl: ;  Calcium Carbonate-Vitamin D (CALCIUM-VITAMIN D) 500-200 MG-UNIT per tablet, Take 1 tablet by mouth 2 (two) times daily with a meal.  , Disp: , Rfl:  esomeprazole (NEXIUM) 40 MG capsule, Take 1 capsule (40 mg total) by mouth daily before breakfast., Disp: 90 capsule, Rfl: 3;  Exenatide ER (BYDUREON) 2 MG SUSR, Inject 2 mg into the skin once a week., Disp: 12 each, Rfl: 3;  ferrous fumarate (HEMOCYTE - 106 MG FE) 325 (106 FE) MG TABS tablet, Take 1 tablet by mouth daily., Disp: , Rfl:  HYDROcodone-acetaminophen (VICODIN) 5-500 MG per tablet,  Take 1 tablet by mouth every 6 (six) hours as needed.  , Disp: , Rfl: ;  metFORMIN (GLUCOPHAGE) 500 MG tablet, Take 1 tablet (500 mg total) by mouth 2 (two) times daily with a meal., Disp: 180 tablet, Rfl: 3;  Multiple Vitamin (MULTIVITAMIN) tablet, Take 1 tablet by mouth daily.  , Disp: , Rfl:  telmisartan (MICARDIS) 40 MG tablet, Take 1 tablet (40 mg total) by mouth daily., Disp: 90 tablet, Rfl: 3;  TRYPTOPHAN PO, Take by mouth. Take 4 tablets ay bedtime, Disp: , Rfl:   Allergies as of 02/23/2013 - Review Complete 02/23/2013  Allergen Reaction Noted  . Theophyllines Palpitations 05/13/2010    1. 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 2. Activities: She has not been swimming in long time. Her last vacation was 20 days in December. She rested, but was not re-freshed..  3. Smoking, alcohol, or drugs: None 4. Primary Care Provider: Dr. Tommie Ard Baxley  REVIEW OF SYSTEM: There are no other significant problems involving Delecia's other body systems.   Objective:  Vital Signs:  BP 153/94  Pulse 90  Wt 203 lb (92.08 kg)  LMP 05/11/2010   Ht Readings from Last 3 Encounters:  10/04/12 5\' 3"  (1.6 m)  09/13/12 5\' 3"  (1.6 m)  11/26/11 5\' 4"  (1.626 m)    Wt Readings from Last 3 Encounters:  02/23/13 203 lb (92.08 kg)  01/05/13 205 lb 12.8 oz (93.35 kg)  10/04/12 209 lb (94.802 kg)   PHYSICAL EXAM:  Constitutional: She has lost 2 lbs in the past 6 weeks. She is more upbeat and positive today. She did get a little teary-eyed when talking about past effort to obtain therapy for her depression/anhedonia. She does look somewhat tired, but much better than at last visit.  Eyes: There is no arcus or proptosis.  Mouth: The oropharynx appears normal. The tongue appears normal. There is normal oral moisture. There is no obvious gingivitis. Neck: There are no bruits present. The thyroid gland appears normal in size. The thyroid gland is approximately 20+ grams in size. The right lobe is within normal limits for size. The left lobe is only slightly enlarged. The consistency of the thyroid gland is normal. There is no thyroid tenderness to palpation. Lungs: The lungs are clear. Air movement is good. Heart: The heart rhythm and rate appear normal. Heart sounds S1 and S2 are normal. I do not appreciate any pathologic heart murmurs. Abdomen: The abdomen is enlarged. Bowel sounds are normal. The abdomen is soft and non-tender. There is no obviously palpable hepatomegaly, splenomegaly, or other masses.  Arms: Muscle mass appears appropriate for age.  Hands: There is no obvious tremor. Phalangeal and metacarpophalangeal joints appear normal. Palms are normal. Legs: Muscle mass appears appropriate for age. There is no edema.  Feet: There are no significant deformities. Dorsalis pedis pulses are normal 1+ bilaterally.  Neurologic: Muscle strength is normal for age and gender  in both the upper and the lower extremities. Muscle tone appears normal. Sensation to touch is normal in the legs and feet.   LAB DATA:   Labs 01/05/13: Hemoglobin A1c was 9.3%, compared with 9.3 at last visit and with 9.1% at the visit prior. All of these values were major increases from  6.6% in May 2012.    Labs 04/14/12: 25-hydroxy vitamin D 21, iron 203,  Vitamin B12 379,  Labs 11/16/11: Hgb 10.3, Hct 32.4%; CMP normal except glucose 146' cholesterol 201, triglycerides 70, HDL 66, LDL 121; vitamin D 22           Labs 06/09/10: Cholesterol was 175, triglycerides 76, HDL 62, LDL 98. TSH was 1.217. Free T4 was 1.10. Free T3 was 2.7. 25-hydroxy vitamin D was 19. PTH was 167.   Assessment and Plan:   ASSESSMENT:  1. Type 2 diabetes mellitus: BG control is poor and unchanged. I'm hoping that the psychological intervention will help motivate Denell to take better care of herself.  2. Hypoglycemia: None 3. Obesity: She has lost 2 pounds since last visit.  4. Autonomic neuropathy and tachycardia: The patient's autonomic neuropathy and  heart essentially parallel her hemoglobin A1c, but with a lag period of several months. These problems are reversible with improved BG control. 5. Goiter: The patient was euthyroid in May 2012. 6. Vitamin D deficiency, hypocalcemia, and secondary hyperparathyroidism: Her May 2012 value for PTH was the highest that we've seen. Since the last visit she has been more compliant with taking her calcium and vitamin D, so it's likely that her vitamin D and calcium will be higher and her PTH will probably be lower. We will see. 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  Patients such as Ms. Goucher have to work harder to take in all of her multivitamins, calcium, and vitamin D. While there have been periods of time when she has been very compliant with her medical regimen, there have also been times when she has been very non-compliant, such as the past six or more months.  7. Hypertension: Her BP is too high again. She needs to resume her telmisartan. She also needs to try to get exercise regularly. 8. Fatigue, poor sleep, hot flashes, memory problems, attention problems:  These issues are all likely to be interrelated. When she was home for a week with back pain, she got more rest, was less stressed, and came back to work able to focus and remember, even despite still having hot flashes.  A. Her life is out of balance. She is fairly burned out. She is anhedonic. She is not taking the time to take care of and cherish herself.  Jacinto Reap I admire and respect Makaela as a superb Copywriter, advertising, as a wonderful diabetes educator, and as a caring and compassionate human being. I've asked her to take time every day to nurture Jahnae and to have fun. I don't want to lose her to an early death. 9. Difficulty swallowing: These problems persist. The most likely cause of her intermittent difficulty swallowing is esophageal dysmotility, but GERD causing stricture is high on the differential diagnosis list. If she does have dysmotility, it  could be associated with stress or medications. Since her difficulty in swallowing pills has been life-long, it's also possible that part of the problem could be psychological, anatomic, or a combination of both. She needs to be evaluated by GI. 10. Anxiety, depression, and PTSD: These are her major barriers to care. Part of these problems is due to genetics, part to bad life experiences, and part to the stresses of daily living in a pressured environment. She really needs help.  PLAN:  1. Diagnostic/Therapeutic: to be seen by Ms. Cathy Shofety today.  2. Therapeutic: Start Bydureon, 2 mg per week. Be as compliant as possible with all of her medications and supplements. Try to fit  in exercise daily. 3. Patient education: She must take her medications and supplements. 4. Follow-up: 8 weeks  Level of Service: This visit lasted in excess of 80 minutes. More than 50% of the visit was devoted to counseling.   Sherrlyn Hock, MD 02/23/2013 10:15 AM

## 2013-02-23 NOTE — Patient Instructions (Addendum)
Follow up visit in 2 months.  

## 2013-02-24 LAB — MICROALBUMIN / CREATININE URINE RATIO
CREATININE, URINE: 251.2 mg/dL
MICROALB UR: 2.88 mg/dL — AB (ref 0.00–1.89)
Microalb Creat Ratio: 11.5 mg/g (ref 0.0–30.0)

## 2013-02-24 LAB — VITAMIN D 25 HYDROXY (VIT D DEFICIENCY, FRACTURES): Vit D, 25-Hydroxy: 18 ng/mL — ABNORMAL LOW (ref 30–89)

## 2013-02-24 LAB — C-PEPTIDE: C PEPTIDE: 1.79 ng/mL (ref 0.80–3.90)

## 2013-02-24 LAB — THYROID PEROXIDASE ANTIBODY: Thyroperoxidase Ab SerPl-aCnc: <10 IU/mL (ref <35.0)

## 2013-03-07 ENCOUNTER — Encounter: Payer: Self-pay | Admitting: *Deleted

## 2013-03-07 ENCOUNTER — Other Ambulatory Visit: Payer: Self-pay | Admitting: *Deleted

## 2013-03-07 DIAGNOSIS — E119 Type 2 diabetes mellitus without complications: Secondary | ICD-10-CM

## 2013-03-07 MED ORDER — FERROUS FUMARATE 325 (106 FE) MG PO TABS: 1.0000 | ORAL_TABLET | Freq: Every day | ORAL | Status: DC

## 2013-03-07 MED ORDER — GLUCOSE BLOOD VI STRP: ORAL_STRIP | Status: DC

## 2013-03-07 MED ORDER — ONDANSETRON HCL 8 MG PO TABS: 8.0000 mg | ORAL_TABLET | Freq: Three times a day (TID) | ORAL | Status: DC | PRN

## 2013-03-07 MED ORDER — METFORMIN HCL 500 MG PO TABS: 500.0000 mg | ORAL_TABLET | Freq: Two times a day (BID) | ORAL | Status: DC

## 2013-03-27 ENCOUNTER — Telehealth: Payer: Self-pay | Admitting: *Deleted

## 2013-03-27 NOTE — Telephone Encounter (Signed)
CATHY SHOWFETY WANTS TO KNOW IF DR. BRENNAN WANTS TO COLLABORATE REGARDING ANTI-DEPRESSANT FOR PATIENT

## 2013-03-30 ENCOUNTER — Telehealth: Payer: Self-pay | Admitting: Internal Medicine

## 2013-03-30 ENCOUNTER — Telehealth: Payer: Self-pay | Admitting: "Endocrinology

## 2013-03-30 NOTE — Telephone Encounter (Signed)
This was handled by Dr. Tobe Sos.

## 2013-03-30 NOTE — Telephone Encounter (Signed)
Patient has been seeing [facility] for counseling. Supervising physician is Dr. Reece Levy. Patient saw her recently saying she needs an antidepressant. Ms. Reece Levy not a medical doctor and cannot prescribe. I think patient should be evaluated by a psychiatrist, Dr. Reece Levy. She has had issues with not taking good care of herself for some time and could benefit from continued psychotherapy and medication.

## 2013-03-30 NOTE — Telephone Encounter (Signed)
I called the patient at work. I told her that I had received the note from [facility] for the encounter on 03/27/13. Ms. Showfety had recommended that Alexa [name] begin treatment with Lexapro. I told Alexa [name] that I do not prescribe anti-depressants or atypical anti-psychotics. She will ask her PCP, Dr. Renold Genta, to prescribe the Lexapro for her.  Sherrlyn Hock

## 2013-04-07 ENCOUNTER — Ambulatory Visit: Payer: 59 | Admitting: Internal Medicine

## 2013-04-20 ENCOUNTER — Ambulatory Visit: Payer: 59 | Admitting: "Endocrinology

## 2013-05-08 ENCOUNTER — Encounter: Payer: Self-pay | Admitting: "Endocrinology

## 2013-05-08 ENCOUNTER — Ambulatory Visit (INDEPENDENT_AMBULATORY_CARE_PROVIDER_SITE_OTHER): Payer: 59 | Admitting: "Endocrinology

## 2013-05-08 VITALS — BP 131/84 | HR 91 | Wt 199.0 lb

## 2013-05-08 DIAGNOSIS — E782 Mixed hyperlipidemia: Secondary | ICD-10-CM

## 2013-05-08 DIAGNOSIS — R748 Abnormal levels of other serum enzymes: Secondary | ICD-10-CM

## 2013-05-08 DIAGNOSIS — D649 Anemia, unspecified: Secondary | ICD-10-CM

## 2013-05-08 DIAGNOSIS — F419 Anxiety disorder, unspecified: Secondary | ICD-10-CM

## 2013-05-08 DIAGNOSIS — E1165 Type 2 diabetes mellitus with hyperglycemia: Principal | ICD-10-CM

## 2013-05-08 DIAGNOSIS — R4584 Anhedonia: Secondary | ICD-10-CM

## 2013-05-08 DIAGNOSIS — F411 Generalized anxiety disorder: Secondary | ICD-10-CM

## 2013-05-08 DIAGNOSIS — E049 Nontoxic goiter, unspecified: Secondary | ICD-10-CM

## 2013-05-08 DIAGNOSIS — R6889 Other general symptoms and signs: Secondary | ICD-10-CM

## 2013-05-08 DIAGNOSIS — IMO0001 Reserved for inherently not codable concepts without codable children: Secondary | ICD-10-CM

## 2013-05-08 DIAGNOSIS — E1169 Type 2 diabetes mellitus with other specified complication: Secondary | ICD-10-CM

## 2013-05-08 DIAGNOSIS — R110 Nausea: Secondary | ICD-10-CM

## 2013-05-08 DIAGNOSIS — E11649 Type 2 diabetes mellitus with hypoglycemia without coma: Secondary | ICD-10-CM

## 2013-05-08 DIAGNOSIS — I10 Essential (primary) hypertension: Secondary | ICD-10-CM

## 2013-05-08 LAB — CBC WITH DIFFERENTIAL/PLATELET
Basophils Absolute: 0.1 10*3/uL (ref 0.0–0.1)
Basophils Relative: 1 % (ref 0–1)
Eosinophils Absolute: 0.1 10*3/uL (ref 0.0–0.7)
Eosinophils Relative: 1 % (ref 0–5)
HEMATOCRIT: 37.8 % (ref 36.0–46.0)
HEMOGLOBIN: 12.5 g/dL (ref 12.0–15.0)
LYMPHS ABS: 2.1 10*3/uL (ref 0.7–4.0)
LYMPHS PCT: 26 % (ref 12–46)
MCH: 24 pg — ABNORMAL LOW (ref 26.0–34.0)
MCHC: 33.1 g/dL (ref 30.0–36.0)
MCV: 72.7 fL — AB (ref 78.0–100.0)
MONO ABS: 0.5 10*3/uL (ref 0.1–1.0)
Monocytes Relative: 6 % (ref 3–12)
NEUTROS ABS: 5.3 10*3/uL (ref 1.7–7.7)
Neutrophils Relative %: 66 % (ref 43–77)
Platelets: 396 10*3/uL (ref 150–400)
RBC: 5.2 MIL/uL — AB (ref 3.87–5.11)
RDW: 17.9 % — ABNORMAL HIGH (ref 11.5–15.5)
WBC: 8 10*3/uL (ref 4.0–10.5)

## 2013-05-08 LAB — COMPREHENSIVE METABOLIC PANEL
ALK PHOS: 101 U/L (ref 39–117)
ALT: 14 U/L (ref 0–35)
AST: 14 U/L (ref 0–37)
Albumin: 4.3 g/dL (ref 3.5–5.2)
BUN: 13 mg/dL (ref 6–23)
CO2: 24 mEq/L (ref 19–32)
CREATININE: 0.74 mg/dL (ref 0.50–1.10)
Calcium: 9.5 mg/dL (ref 8.4–10.5)
Chloride: 100 mEq/L (ref 96–112)
Glucose, Bld: 215 mg/dL — ABNORMAL HIGH (ref 70–99)
Potassium: 4.3 mEq/L (ref 3.5–5.3)
Sodium: 134 mEq/L — ABNORMAL LOW (ref 135–145)
Total Bilirubin: 0.5 mg/dL (ref 0.2–1.2)
Total Protein: 6.9 g/dL (ref 6.0–8.3)

## 2013-05-08 LAB — IRON: Iron: 55 ug/dL (ref 42–145)

## 2013-05-08 LAB — POCT GLYCOSYLATED HEMOGLOBIN (HGB A1C): HEMOGLOBIN A1C: 8.1

## 2013-05-08 LAB — GLUCOSE, POCT (MANUAL RESULT ENTRY): POC Glucose: 129 mg/dl — AB (ref 70–99)

## 2013-05-08 MED ORDER — PROMETHAZINE HCL 50 MG PO TABS: 50.0000 mg | ORAL_TABLET | Freq: Four times a day (QID) | ORAL | Status: DC | PRN

## 2013-05-08 NOTE — Patient Instructions (Signed)
Follow up visit in 2 months.  

## 2013-05-08 NOTE — Progress Notes (Addendum)
Subjective:  Patient Name: Alexa [name] Date of Birth: 06/22/1960  MRN: [id number]  Alexa [name]  presents to the office today for follow-up of her type 2 diabetes mellitus, obesity, combined hyperlipidemia, GERD, hypertension, ASHD, dyspepsia, pedal edema, non-proliferative diabetic retinopathy, goiter, depression, autonomic neuropathy, tachycardia, peripheral neuropathy, vitamin D deficiency, hypocalcemia, secondary hyperparathyroidism, glossitis, pallor, fatigue, iron deficiency anemia, and status post Roux-en-Y gastric bypass.   HISTORY OF PRESENT ILLNESS:   [name] is a 53 y.o. Caucasian woman. [name] was unaccompanied.   1. The patient first presented to me on 08/20/04 in referral from her primary care internist, Dr. Sharlet Salina, for evaluation and management of type 2 diabetes, obesity, and multiple medical issues. She was 53 years old.   A. Patient had a long history of obesity. She was heavy as a child. She underwent menarche at age 27. At age 89 she was 180 pounds. In 1996 she weighed 218 pounds. In 1999 she was diagnosed with type 2 diabetes mellitus. Her weight at that point was 250 pounds. She was treated with Glucophage, and Actos. Actos made her gain even more weight. She has also been treated with glipizide in the past. More recently she had been treated with Lantus and Glucophage plus regular insulin as needed for when she took steroids for asthma attacks. Maximum weight had been 296 pounds one month prior to that first visit. Her tendency to gain weight and her difficulty in losing weight were aggravated by long-standing, intermittent depression and by severe, recurrent asthma requiring the use of steroid medications.  B. Past medical history was also positive for a 60% blockage in one of her coronary arteries. She had significant issues with GERD and dyspepsia. She also had combined hyperlipidemia. She had a previous cardiac catheterization and previous tonsillectomy. She was  allergic to theophylline. Her pertinent review of systems was positive for some numbness and tingling in her feet. Family history was positive for type 2 diabetes in her father and her maternal grandmother. Her brother weighed 250 pounds at a height of 76 inches. Both her mother and her sister were hypothyroid.  C. On physical examination, her weight was 279.9 pounds. Her BMI was 49.6. Her blood pressure was 142/86. Her heart rate was 96. Her hemoglobin A1c was 9.8%. She was alert and oriented x3. Her affect was normal. Her insight was fair. She was obviously quite heavy. She had a 25 gm thyroid gland. She had 1+ tremor of her hands. She had 1+ DP pulses and 2+ tinea pedis. Sensation to touch was intact in her feet. Laboratory data included a normal CMP. Her cholesterol was 175, triglycerides 76, HDL 53, and LDL 107. Her TSH was 2.98. Since she obviously did have type 2 diabetes mellitus associated with morbid obesity, and since weight loss was a major factor for her, I asked her to resume her metformin twice daily. I also started her on Byetta, initially 5 mcg twice daily and then 10 mcg twice daily. I discontinued her Lantus insulin.  2. During the last 8 years,we have had some major successes and some new problem areas.  A. T2DM: In October 2007 the combination of Byetta and metformin was causing more gastrointestinal problems. Patient opted to stop the metformin and continue the Byetta because it was helping her with weight control and blood sugar control. By 08/11/06 her weight had decreased to 267.6 pounds. Hemoglobin A1c was 8.6%. At that point she decided to have bariatric surgery. She had a Roux-en-Y gastric  bypass on 12/20/2006. She subsequently lost weight down to 173.4 pounds on 05/23/08, but then subsequently regained weight to the 190s. Her  hemoglobin A1c values varied in parallel with her weight. On 05/23/08, at the point of her lowest weight, her hemoglobin A1c dropped to a nadir of 6.3%.  Since  then her hemoglobin A1c values have varied between 6.6 and 10.3%. Although we were initially able to stop all of her diabetes medicines after bariatric surgery, when she began to regain weight we restarted metformin, 500 mg twice daily. Although she took her metformin twice daily for a long time, she had discontinued it many months ago. She hated to take medicines and could not make herself be compliant in taking medications or exercising..   B. Vitamin D deficiency, hypocalcemia, and secondary hyperparathyroidism: Just before her gastric bypass, I obtained baseline bone mineral metabolism studies.. Her 25-hydroxy vitamin D was 7 (normal greater than or equal to 30). Her calcium was 9.5 (normal 8.6-10.6). Her parathyroid hormone was 38.4 (normal 14-72). Subsequent to her surgery, the patient was supposed to be taking multivitamins with calcium and vitamin D, but did not always do so. On 03/29/2007 her 25-hydroxy vitamin D was 29, but her calcium decreased to 8.7. Her PTH was slightly elevated at 73.5. Her 1,25-hydroxy vitamin D was 46. Her iron was 56. I asked her to make sure she took her multivitamins and calcium daily. Unfortunately, she has also not been compliant with her multivitamins and calcium. Her 25-hydroxy vitamin D values have varied between 19-27. Her calcium values have varied between 8.1-9.4. Her PTH values have remained elevated between 82.9-167. The patient's iron levels have also been low, between 32-34. She is supposed to be taking iron every day as well. Unfortunately, since  August of 2014 she has discontinued all vitamins and supplements.   3. The patient's last [facility] visit was on 02/23/13. She started Bydureon weekly dosing at that visit. She also started seeing Ms. Baltazar Apo for counseling.  A. In the interim, she has had a URI or allergies, but no severe asthma or bronchitis.  She has been nauseated since starting Bydureon, but feels better overall and stronger. She has lost 4  pounds since last visit. She is not quite as tired. She is not drawn as much to food.   B. She continues to see Ms. Baltazar Apo, MS. These visits are going well. Ellie is receiving some tools that will help her manage her life better. She thinks that she is taking Lexapro. Her anxiety and mood are better.   C. She take two Citracal-D tablets per day. She takes her tryptophan occasionally.   D. She and her husband will join the [employer].  E. She continues to feel somewhat anxious and stressed at work and at home. Her anxiety is now her major perceived issue. She does not feel as depressed as she was before. She is still somewhat anhedonic.   F. She has not been having dental problems.  G. Hot flashes might be a bit better.  H. She is reliable with all of her meds except for iron. She hasn't started exercising yet.   4. Pertinent Review of Systems:  Constitutional: The patient feels "much better and less tired."   Eyes: Vision is good. There are no significant eye complaints. She had an eye exam in April or May 2014. That exam showed no signs of diabetic retinopathy. She is due for a follow up exam soon.  Neck: Her swallowing  has improved. She has no complaints of anterior neck swelling, soreness, tenderness,  pressure, or discomfort.  Heart: Heart rate increases with exercise or other physical activity. She occasionally has costochondritic pains. She has no complaints of palpitations, irregular heat beats, other chest pain, or chest pressure. Gastrointestinal: Bydureon causes nausea and horrible constipation. She rarely has problems with reflux. She infrequently feels something uncomfortable in her left upper ribcage. Abdomen has not been tender to palpation. Bowel movents seem normal. The patient has no complaints of excessive hunger, acid reflux, upset stomach, stomach aches or pains, diarrhea, or constipation. Legs: Muscle mass and strength seem normal. Leg cramps have not been much  of a problem recently. There are no other complaints of numbness, tingling, burning, or pain. She occasionally has mild edema. Feet: There are no obvious foot problems. There are no complaints of numbness, tingling, burning, or pain. No edema is noted. Psychological: She is much better. There is still not enough joy in her life.  Hypoglycemia: None  4. BG printout: Her average BG is 125. AM BGs vary from 100-149, compared with 177-267 before beginning the Bydureon. Lunch BG was 133.  PAST MEDICAL, FAMILY, AND SOCIAL HISTORY:  Past Medical History  Diagnosis Date  . Diabetes mellitus   . Asthma   . Hypertension   . GERD (gastroesophageal reflux disease)   . CAD (coronary artery disease)   . Elevated homocysteine   . Chronic insomnia   . Diabetes mellitus type II   . Obesity, Class III, BMI 40-49.9 (morbid obesity)   . Combined hyperlipidemia   . Asthma, chronic   . GERD (gastroesophageal reflux disease)   . Dyspepsia   . Nonproliferative diabetic retinopathy associated with type 2 diabetes mellitus     RESOLVED  . Goiter   . Depression   . Diabetic autonomic neuropathy   . DM type 2 with diabetic peripheral neuropathy   . Vitamin D deficiency   . Hyperparathyroidism , secondary, non-renal   . Tachycardia   . Glossitis   . Thyroiditis, autoimmune   . Fatigue   . Pallor   . Anemia, iron deficiency   . Essential tremor     Family History  Problem Relation Age of Onset  . Thyroid disease Mother     Hypothyroid  . Diabetes Father     T2 DM  . Obesity Brother     250 pounds at a height of 76 inches.  . Thyroid disease Sister     Hypothyroid  . Diabetes Maternal Grandmother     Current outpatient prescriptions:ALBUTEROL IN, Inhale into the lungs.  , Disp: , Rfl: ;  Calcium Carbonate-Vitamin D (CALCIUM-VITAMIN D) 500-200 MG-UNIT per tablet, Take 1 tablet by mouth 2 (two) times daily with a meal.  , Disp: , Rfl: ;  Exenatide ER (BYDUREON) 2 MG SUSR, Inject 2 mg into the  skin once a week., Disp: 12 each, Rfl: 3;  glucose blood (ACCU-CHEK SMARTVIEW) test strip, Check sugar 6 x daily, Disp: 600 each, Rfl: 4 metFORMIN (GLUCOPHAGE) 500 MG tablet, Take 1 tablet (500 mg total) by mouth 2 (two) times daily with a meal., Disp: 180 tablet, Rfl: 4;  Multiple Vitamin (MULTIVITAMIN) tablet, Take 1 tablet by mouth daily.  , Disp: , Rfl: ;  telmisartan (MICARDIS) 40 MG tablet, Take 1 tablet (40 mg total) by mouth daily., Disp: 90 tablet, Rfl: 3 cyclobenzaprine (FLEXERIL) 10 MG tablet, Take 1 tablet (10 mg total) by mouth 3 (three) times daily as needed for  muscle spasms., Disp: 270 tablet, Rfl: 3;  esomeprazole (NEXIUM) 40 MG capsule, Take 1 capsule (40 mg total) by mouth daily before breakfast., Disp: 90 capsule, Rfl: 3;  ferrous fumarate (HEMOCYTE - 106 MG FE) 325 (106 FE) MG TABS tablet, Take 1 tablet (106 mg of iron total) by mouth daily., Disp: 90 each, Rfl: 4 HYDROcodone-acetaminophen (VICODIN) 5-500 MG per tablet, Take 1 tablet by mouth every 6 (six) hours as needed.  , Disp: , Rfl: ;  ondansetron (ZOFRAN) 8 MG tablet, Take 1 tablet (8 mg total) by mouth every 8 (eight) hours as needed for nausea or vomiting., Disp: 30 tablet, Rfl: 1;  TRYPTOPHAN PO, Take by mouth. Take 4 tablets ay bedtime, Disp: , Rfl:   Allergies as of 05/08/2013 - Review Complete 05/08/2013  Allergen Reaction Noted  . Theophyllines Palpitations 05/13/2010    1. 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 2. Activities: She has not been swimming in long time. Her last vacation was 20 days in December. She rested, but was not refreshed..  3. Smoking, alcohol, or drugs: None 4. Primary Care Provider: Dr. Tommie Ard Baxley 5. Therapist: Ms. Joya Martyr, MS  REVIEW OF SYSTEM: There are no other significant problems involving Nawaal's other body systems.   Objective:  Vital Signs:  BP  131/84  Pulse 91  Wt 199 lb (90.266 kg)  LMP 05/11/2010   Ht Readings from Last 3 Encounters:  10/04/12 5\' 3"  (1.6 m)  09/13/12 5\' 3"  (1.6 m)  11/26/11 5\' 4"  (1.626 m)   Wt Readings from Last 3 Encounters:  05/08/13 199 lb (90.266 kg)  02/23/13 203 lb (92.08 kg)  01/05/13 205 lb 12.8 oz (93.35 kg)   PHYSICAL EXAM:  Constitutional: She has lost 4 lbs in the past 3 months. She is more upbeat and positive today. She smiled and laughed much more today. She was so happy to have "broken he 200 pound barrier" that she glowed. Eyes: There is no arcus or proptosis.  Mouth: The oropharynx appears normal. The tongue appears normal. There is normal oral moisture. There is no obvious gingivitis. Neck: There are no bruits present. The thyroid gland appears normal in size. The thyroid gland is approximately 20+ grams in size. The right lobe is within normal limits for size. The left lobe is only slightly enlarged. The consistency of the thyroid gland is normal. There is no thyroid tenderness to palpation. Lungs: The lungs are clear. Air movement is good. Heart: The heart rhythm and rate appear normal. Heart sounds S1 and S2 are normal. I do not appreciate any pathologic heart murmurs. Abdomen: The abdomen is enlarged. Bowel sounds are normal. The abdomen is soft and non-tender. There is no obviously palpable hepatomegaly, splenomegaly, or other masses.  Arms: Muscle mass appears appropriate for age.  Hands: There is 1+ tremor. Phalangeal and metacarpophalangeal joints appear normal. Palms are normal. Legs: Muscle mass appears appropriate for age. There is no edema.  Feet: There are no significant deformities. Dorsalis pedis pulses are normal 1+ bilaterally.  Neurologic: Muscle strength is normal for age and gender  in both the upper and the lower extremities. Muscle tone appears normal. Sensation to touch is normal in the legs and feet.   LAB DATA:  Labs 05/08/13: HbA1c is 8.1% today, compared with  9.3% in December.   Labs 02/23/13: CMP normal, except  glucose 194 and alkaline phosphatase 126; microalbumin/creatinine ratio was 11.5; TSH 2.086, free T4 1.29, free T3 3.2, TPO antibody < 10; 25-hydroxy vitamin D 18; C-peptide 1.79; cholesterol 156, triglycerides 64, HDL 61, LDL 82  Labs 01/05/13: Hemoglobin A1c was 9.3%, compared with 9.3 at last visit and with 9.1% at the visit prior. All of these values were major increases from 6.6% in May 2012.    Labs 04/14/12: 25-hydroxy vitamin D 21, iron 203,  Vitamin B12 379,   Labs 11/16/11: Hgb 10.3, Hct 32.4%; CMP normal except glucose 146' cholesterol 201, triglycerides 70, HDL 66, LDL 121; vitamin D 22           Labs 06/09/10: Cholesterol was 175, triglycerides 76, HDL 62, LDL 98. TSH was 1.217. Free T4 was 1.10. Free T3 was 2.7. 25-hydroxy vitamin D was 19. PTH was 167.   Assessment and Plan:   ASSESSMENT:  1. Type 2 diabetes mellitus: BG control is much improved. Katelan is doing a good job of taking her medications and managing her DM. Unfortunately, she is having severe GI adverse effects from Bydureon, which has one fixed dose. She may benefit more from using Victoza. The disadvantage of Victoza is that she must take it daily. The advantage of Victoza is that there are 23 dosing increments. I asked our nurses to give her one sample Victoza pen to try.   2. Hypoglycemia: None 3. Obesity: She has lost 4 pounds since last visit.  4. Autonomic neuropathy and tachycardia: The patient's autonomic neuropathy and  heart essentially parallel her hemoglobin A1c, but with a lag period of 6-12 months. These problems are reversible with improved BG control. 5. Goiter: The patient was euthyroid in May 2012 and again in January 2015.  6.  Vitamin D deficiency, hypocalcemia, and secondary hyperparathyroidism: Her May 2012 value for PTH was the highest that we've seen. Her vitamin D level at last visit was low. She has been much more compliant in taking her  Citracal-D since then. 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  Patients such as Ms. Barona have to work harder to take in all of her multivitamins, calcium, and vitamin D. While there have been periods of time when she has been very compliant with her medical regimen, there have also been times when she has been very non-compliant.  7. Hypertension: Her BP is better today. She needs to continue her telmisartan. She also needs to try to get exercise regularly. 8. Fatigue, poor sleep, hot flashes, memory problems, attention problems: These issues are all likely to be interrelated. When she was home for a week with back pain, she got more rest, was less stressed, and came back to work able to focus and remember, even despite still having hot flashes. 8. Anhedonia and anxiety: She is in much better shape at this visit. Kudos to both Ms Matilde Sprang and to [place] herself. It is so wonderful to see her smile and laugh again.  9. Difficulty swallowing: This problem is less frequent and severe.   PLAN:  1. Diagnostic: CMP, calcium, PTH, 25-hydroxy vitamin D.  2. Therapeutic: Stop Bydureon. Start Victoza. 0.6 mg/day. Advance dose by one click per week. Be as compliant as possible with all of her medications and supplements. Try to fit in exercise daily. 3. Patient education: She must take her medications and supplements. 4. Follow-up: 8 weeks  Level of Service: This visit lasted in  excess of 60 minutes. More than 50% of the visit was devoted to counseling.   Sherrlyn Hock, MD 05/08/2013 2:39 PM

## 2013-05-09 LAB — VITAMIN D 25 HYDROXY (VIT D DEFICIENCY, FRACTURES): VIT D 25 HYDROXY: 46 ng/mL (ref 30–89)

## 2013-05-09 LAB — PTH, INTACT AND CALCIUM
Calcium: 9.5 mg/dL (ref 8.4–10.5)
PTH: 90.2 pg/mL — ABNORMAL HIGH (ref 14.0–72.0)

## 2013-05-11 ENCOUNTER — Encounter: Payer: Self-pay | Admitting: *Deleted

## 2013-05-11 ENCOUNTER — Telehealth: Payer: Self-pay | Admitting: "Endocrinology

## 2013-05-11 ENCOUNTER — Other Ambulatory Visit: Payer: Self-pay | Admitting: *Deleted

## 2013-05-11 DIAGNOSIS — E119 Type 2 diabetes mellitus without complications: Secondary | ICD-10-CM

## 2013-05-11 MED ORDER — ONDANSETRON HCL 8 MG PO TABS: 8.0000 mg | ORAL_TABLET | Freq: Three times a day (TID) | ORAL | Status: DC | PRN

## 2013-05-11 MED ORDER — PROMETHAZINE HCL 25 MG PO TABS: ORAL_TABLET | ORAL | Status: DC

## 2013-05-11 NOTE — Addendum Note (Signed)
Addended by: Sherrlyn Hock on: 05/11/2013 07:33 PM   Modules accepted: Orders

## 2013-05-12 ENCOUNTER — Encounter: Payer: Self-pay | Admitting: Internal Medicine

## 2013-05-12 ENCOUNTER — Ambulatory Visit (INDEPENDENT_AMBULATORY_CARE_PROVIDER_SITE_OTHER): Payer: 59 | Admitting: Internal Medicine

## 2013-05-12 VITALS — BP 144/92 | HR 86 | Temp 99.1°F | Wt 200.0 lb

## 2013-05-12 DIAGNOSIS — J45909 Unspecified asthma, uncomplicated: Secondary | ICD-10-CM

## 2013-05-12 MED ORDER — HYDROCODONE-HOMATROPINE 5-1.5 MG/5ML PO SYRP: 5.0000 mL | ORAL_SOLUTION | Freq: Three times a day (TID) | ORAL | Status: DC | PRN

## 2013-05-12 MED ORDER — FLUTICASONE-SALMETEROL 250-50 MCG/DOSE IN AEPB: 1.0000 | INHALATION_SPRAY | Freq: Two times a day (BID) | RESPIRATORY_TRACT | Status: DC

## 2013-05-12 MED ORDER — LEVOFLOXACIN 500 MG PO TABS: 500.0000 mg | ORAL_TABLET | Freq: Every day | ORAL | Status: DC

## 2013-05-12 MED ORDER — METHYLPREDNISOLONE 4 MG PO TABS: ORAL_TABLET | ORAL | Status: DC

## 2013-05-12 MED ORDER — ALBUTEROL SULFATE HFA 108 (90 BASE) MCG/ACT IN AERS: 2.0000 | INHALATION_SPRAY | Freq: Four times a day (QID) | RESPIRATORY_TRACT | Status: DC | PRN

## 2013-05-12 MED ORDER — ALBUTEROL SULFATE (2.5 MG/3ML) 0.083% IN NEBU: 2.5000 mg | INHALATION_SOLUTION | Freq: Four times a day (QID) | RESPIRATORY_TRACT | Status: DC | PRN

## 2013-05-12 NOTE — Progress Notes (Signed)
   Subjective:    Patient ID: Alexa [name], female    DOB: 1960/12/27, 53 y.o.   MRN: [id number]  HPI Patient continues to be followed by Dr. Tobe Sos. Prior history of diabetes status post gastric bypass surgery. She has an elevated PTH which he follows. He says it is stable. She has appointment for physical exam here in the near future. She has had a number of fasting labs done ordered by Dr. Tobe Sos. Therefore we'll not need fasting labs in the near future for physical exam. Has come down with coughing congestion. Started as a sore throat and has gone into her chest. Some discolored sputum. Shortness of breath and wheezing. Pulse oximetry is 98% on room air    Review of Systems     Objective:   Physical Exam HEENT exam: Pharynx is clear. Left TM is full and pink but not red. Right TM is chronically scarred but not full or red. Neck is supple without significant adenopathy. Chest some scattered inspiratory wheezing       Assessment & Plan:  Asthmatic bronchitis  Plan: Medrol 4 mg 6 day dosepak to take in tapering course as directed.  Hycodan 1 teaspoon by mouth every 8 hours when necessary cough. Levaquin 500 milligrams daily for 10 days. Advair 250/50 one spray by mouth every 12 hours.  Refill albuterol nebulizer treatment and albuterol inhaler 2 sprays by mouth 4 times a day when necessary

## 2013-05-12 NOTE — Patient Instructions (Signed)
Take oral steroids, levaquin and Hycodan as directed. Use inhalers as directed.

## 2013-05-19 ENCOUNTER — Encounter: Payer: Self-pay | Admitting: *Deleted

## 2013-05-19 NOTE — Progress Notes (Signed)
On 05/08/13 Alexa [name] was given 1 sample pen of Victoza lot# [id number]; exp 4/16. KW

## 2013-05-23 ENCOUNTER — Other Ambulatory Visit: Payer: 59 | Admitting: Internal Medicine

## 2013-06-12 ENCOUNTER — Ambulatory Visit (INDEPENDENT_AMBULATORY_CARE_PROVIDER_SITE_OTHER): Payer: 59 | Admitting: Internal Medicine

## 2013-06-12 ENCOUNTER — Encounter: Payer: Self-pay | Admitting: Internal Medicine

## 2013-06-12 VITALS — BP 116/82 | HR 84 | Ht 64.0 in | Wt 199.5 lb

## 2013-06-12 DIAGNOSIS — Z9884 Bariatric surgery status: Secondary | ICD-10-CM

## 2013-06-12 DIAGNOSIS — E349 Endocrine disorder, unspecified: Secondary | ICD-10-CM

## 2013-06-12 DIAGNOSIS — Z8739 Personal history of other diseases of the musculoskeletal system and connective tissue: Secondary | ICD-10-CM

## 2013-06-12 DIAGNOSIS — K219 Gastro-esophageal reflux disease without esophagitis: Secondary | ICD-10-CM

## 2013-06-12 DIAGNOSIS — Z8639 Personal history of other endocrine, nutritional and metabolic disease: Secondary | ICD-10-CM

## 2013-06-12 DIAGNOSIS — Z8709 Personal history of other diseases of the respiratory system: Secondary | ICD-10-CM

## 2013-06-12 DIAGNOSIS — K3189 Other diseases of stomach and duodenum: Secondary | ICD-10-CM

## 2013-06-12 DIAGNOSIS — F329 Major depressive disorder, single episode, unspecified: Secondary | ICD-10-CM

## 2013-06-12 DIAGNOSIS — I10 Essential (primary) hypertension: Secondary | ICD-10-CM

## 2013-06-12 DIAGNOSIS — F32A Depression, unspecified: Secondary | ICD-10-CM

## 2013-06-12 DIAGNOSIS — F3289 Other specified depressive episodes: Secondary | ICD-10-CM

## 2013-06-12 DIAGNOSIS — N898 Other specified noninflammatory disorders of vagina: Secondary | ICD-10-CM

## 2013-06-12 DIAGNOSIS — E119 Type 2 diabetes mellitus without complications: Secondary | ICD-10-CM

## 2013-06-12 DIAGNOSIS — R1013 Epigastric pain: Secondary | ICD-10-CM

## 2013-06-12 DIAGNOSIS — N9489 Other specified conditions associated with female genital organs and menstrual cycle: Secondary | ICD-10-CM

## 2013-06-12 DIAGNOSIS — Z862 Personal history of diseases of the blood and blood-forming organs and certain disorders involving the immune mechanism: Secondary | ICD-10-CM

## 2013-06-12 LAB — POCT URINALYSIS DIPSTICK
Bilirubin, UA: NEGATIVE
Glucose, UA: 250
KETONES UA: NEGATIVE
Leukocytes, UA: NEGATIVE
Nitrite, UA: NEGATIVE
Protein, UA: NEGATIVE
RBC UA: NEGATIVE
SPEC GRAV UA: 1.015
UROBILINOGEN UA: NEGATIVE
pH, UA: 7

## 2013-06-12 MED ORDER — CYCLOBENZAPRINE HCL 10 MG PO TABS: 10.0000 mg | ORAL_TABLET | Freq: Three times a day (TID) | ORAL | Status: DC | PRN

## 2013-06-12 MED ORDER — METFORMIN HCL 500 MG PO TABS: 500.0000 mg | ORAL_TABLET | Freq: Two times a day (BID) | ORAL | Status: DC

## 2013-06-12 MED ORDER — ESTROGENS, CONJUGATED 0.625 MG/GM VA CREA: 1.0000 | TOPICAL_CREAM | Freq: Every day | VAGINAL | Status: DC

## 2013-06-12 MED ORDER — FERROUS FUMARATE 325 (106 FE) MG PO TABS: 1.0000 | ORAL_TABLET | Freq: Two times a day (BID) | ORAL | Status: DC

## 2013-06-12 MED ORDER — HYDROCODONE-ACETAMINOPHEN 5-500 MG PO TABS: 1.0000 | ORAL_TABLET | Freq: Four times a day (QID) | ORAL | Status: DC | PRN

## 2013-06-12 MED ORDER — ESOMEPRAZOLE MAGNESIUM 40 MG PO CPDR: 40.0000 mg | DELAYED_RELEASE_CAPSULE | Freq: Every day | ORAL | Status: DC

## 2013-06-12 MED ORDER — FLUTICASONE-SALMETEROL 250-50 MCG/DOSE IN AEPB: 1.0000 | INHALATION_SPRAY | Freq: Two times a day (BID) | RESPIRATORY_TRACT | Status: DC

## 2013-06-12 NOTE — Patient Instructions (Signed)
Continue Lexapro. See Dr. Tobe Sos in September. RTC in one year.

## 2013-06-12 NOTE — Progress Notes (Signed)
Subjective:    Patient ID: Alexa [name], female    DOB: 1960-09-21, 53 y.o.   MRN: [id number]  HPI 53 year old White Female in today for health maintenance exam and evaluation of medical issues. She has a history of diabetes mellitus and hypertension. She is status post gastric bypass surgery for obesity in 2008. She has a history of asthma. She had a cardiac catheterization in August 2003 showing nonobstructive coronary artery disease. She was hospitalized in 2007 and had another cardiac catheterization showing minor nonobstructive coronary disease and normal left ventricular function. Followed by Dr. Tobe Sos, endocrinologist. Prior to gastric bypass surgery, her diabetes and hypertension were not well controlled due to noncompliance.  Social history: She is married. She works as a Writer at [employer]. Does not smoke or consume alcohol. Husband is a Company secretary. No children.  Family history: 3 brothers in good health. 2 sisters in good health. Father with history of hypertension and diabetes. Mother with history of thyroid disease.  Tonsillectomy 1982. Surgery for ingrown toenails in the 1970s.  No known drug allergies.    Review of Systems  Constitutional: Positive for fatigue.  HENT: Negative.   Eyes: Negative.   Respiratory:       History of asthma and reactive airways disease  Cardiovascular:       History of nonobstructive coronary disease  Endocrine:       History of diabetes mellitus  Genitourinary:       Vaginal dryness  Musculoskeletal:       History of recurrent back  Hematological: Negative.        Objective:   Physical Exam  Vitals reviewed. Constitutional: She is oriented to person, place, and time. She appears well-developed and well-nourished. No distress.  HENT:  Head: Normocephalic and atraumatic.  Right Ear: External ear normal.  Left Ear: External ear normal.  Mouth/Throat: Oropharynx is clear and moist. No oropharyngeal exudate.  Eyes:  Conjunctivae and EOM are normal. Pupils are equal, round, and reactive to light. Right eye exhibits no discharge. Left eye exhibits no discharge. No scleral icterus.  Neck: Neck supple. No JVD present. No thyromegaly present.  Cardiovascular: Normal rate, regular rhythm, normal heart sounds and intact distal pulses.   No murmur heard. Pulmonary/Chest: Effort normal and breath sounds normal. No respiratory distress. She has no wheezes. She has no rales. She exhibits no tenderness.  Abdominal: Soft. Bowel sounds are normal. She exhibits no distension and no mass. There is no tenderness. There is no rebound and no guarding.  Genitourinary:  Pap done 2013. Bimanual normal.  Musculoskeletal: Normal range of motion. She exhibits no edema.  Lymphadenopathy:    She has no cervical adenopathy.  Neurological: She is alert and oriented to person, place, and time. She has normal reflexes. No cranial nerve deficit. Coordination normal.  Skin: Skin is warm and dry. No rash noted. She is not diaphoretic.  Psychiatric: She has a normal mood and affect. Her behavior is normal. Judgment and thought content normal.          Assessment & Plan:  History of gastric bypass surgery  Diabetes mellitus- followed by Dr. Tobe Sos  Obesity  Elevated parathyroid hormone followed by Dr. Tobe Sos  History of hypertension  History of noncompliance  History of vitamin D deficiency  History of iron deficiency  Depression- treated with Lexapro  Vaginal dryness- have prescribed premarin vaginal cream  History of nonobstructive coronary disease  History of asthma  History of low back pain  Plan: Continue close followup with endocrinologist. Please try to take good care of yourself. Return in one year or as needed. Continue Lexapro for depression.

## 2013-06-21 MED ORDER — HYDROCODONE-ACETAMINOPHEN 5-325 MG PO TABS: 1.0000 | ORAL_TABLET | Freq: Four times a day (QID) | ORAL | Status: DC | PRN

## 2013-07-27 ENCOUNTER — Ambulatory Visit (INDEPENDENT_AMBULATORY_CARE_PROVIDER_SITE_OTHER): Payer: 59 | Admitting: Internal Medicine

## 2013-07-27 ENCOUNTER — Encounter: Payer: Self-pay | Admitting: Internal Medicine

## 2013-07-27 VITALS — BP 136/88 | HR 80 | Temp 98.0°F | Wt 200.0 lb

## 2013-07-27 DIAGNOSIS — M5441 Lumbago with sciatica, right side: Secondary | ICD-10-CM

## 2013-07-27 DIAGNOSIS — M5430 Sciatica, unspecified side: Secondary | ICD-10-CM

## 2013-07-27 MED ORDER — HYDROCODONE-ACETAMINOPHEN 5-325 MG PO TABS: 1.0000 | ORAL_TABLET | Freq: Four times a day (QID) | ORAL | Status: DC | PRN

## 2013-07-27 MED ORDER — PREDNISONE 10 MG PO KIT: PACK | ORAL | Status: DC

## 2013-08-27 NOTE — Progress Notes (Signed)
   Subjective:    Patient ID: Alexa [name], female    DOB: 14-Jul-1960, 53 y.o.   MRN: [id number]  HPI  Patient in today with episode of low back pain radiating into right buttock. She has a prior history of recurrent low back pain but none recently. Not sure exactly what started the problem. Has been going on for several days. Not getting better. No numbness or weakness in right lower extremity.    Review of Systems     Objective:   Physical Exam Straight leg raising is negative at 90 bilaterally. Deep tendon reflexes 2+ and symmetrical. Muscle strength is 5 over 5 in the lower extremities in all groups tested       Assessment & Plan:  Right sciatica  Plan: Sterapred DS 12 day 10 mg dosepak take as directed. Take Flexeril which she has on hand at home 10 mg at bedtime. Norco 5/325 (#60) by mouth every 6 hours when necessary pain.

## 2013-08-27 NOTE — Patient Instructions (Signed)
Take prednisone in tapering course as directed. Take hydrocodone/APAP and Flexeril as needed.

## 2013-10-10 ENCOUNTER — Ambulatory Visit (INDEPENDENT_AMBULATORY_CARE_PROVIDER_SITE_OTHER): Payer: 59 | Admitting: Internal Medicine

## 2013-10-10 ENCOUNTER — Encounter: Payer: Self-pay | Admitting: "Endocrinology

## 2013-10-10 ENCOUNTER — Encounter: Payer: Self-pay | Admitting: Internal Medicine

## 2013-10-10 ENCOUNTER — Other Ambulatory Visit: Payer: Self-pay | Admitting: *Deleted

## 2013-10-10 ENCOUNTER — Ambulatory Visit (INDEPENDENT_AMBULATORY_CARE_PROVIDER_SITE_OTHER): Payer: 59 | Admitting: "Endocrinology

## 2013-10-10 VITALS — BP 149/102 | HR 96 | Wt 203.0 lb

## 2013-10-10 VITALS — BP 140/88 | HR 82 | Ht 64.0 in | Wt 203.0 lb

## 2013-10-10 DIAGNOSIS — E1049 Type 1 diabetes mellitus with other diabetic neurological complication: Secondary | ICD-10-CM

## 2013-10-10 DIAGNOSIS — E1165 Type 2 diabetes mellitus with hyperglycemia: Principal | ICD-10-CM

## 2013-10-10 DIAGNOSIS — E211 Secondary hyperparathyroidism, not elsewhere classified: Secondary | ICD-10-CM

## 2013-10-10 DIAGNOSIS — E1149 Type 2 diabetes mellitus with other diabetic neurological complication: Secondary | ICD-10-CM

## 2013-10-10 DIAGNOSIS — F32A Depression, unspecified: Secondary | ICD-10-CM

## 2013-10-10 DIAGNOSIS — E559 Vitamin D deficiency, unspecified: Secondary | ICD-10-CM

## 2013-10-10 DIAGNOSIS — R1310 Dysphagia, unspecified: Secondary | ICD-10-CM

## 2013-10-10 DIAGNOSIS — I498 Other specified cardiac arrhythmias: Secondary | ICD-10-CM

## 2013-10-10 DIAGNOSIS — E162 Hypoglycemia, unspecified: Secondary | ICD-10-CM

## 2013-10-10 DIAGNOSIS — F419 Anxiety disorder, unspecified: Secondary | ICD-10-CM

## 2013-10-10 DIAGNOSIS — G909 Disorder of the autonomic nervous system, unspecified: Secondary | ICD-10-CM

## 2013-10-10 DIAGNOSIS — IMO0001 Reserved for inherently not codable concepts without codable children: Secondary | ICD-10-CM

## 2013-10-10 DIAGNOSIS — E1143 Type 2 diabetes mellitus with diabetic autonomic (poly)neuropathy: Secondary | ICD-10-CM

## 2013-10-10 DIAGNOSIS — F329 Major depressive disorder, single episode, unspecified: Secondary | ICD-10-CM

## 2013-10-10 DIAGNOSIS — E049 Nontoxic goiter, unspecified: Secondary | ICD-10-CM

## 2013-10-10 DIAGNOSIS — F341 Dysthymic disorder: Secondary | ICD-10-CM

## 2013-10-10 DIAGNOSIS — Z23 Encounter for immunization: Secondary | ICD-10-CM

## 2013-10-10 DIAGNOSIS — I10 Essential (primary) hypertension: Secondary | ICD-10-CM

## 2013-10-10 DIAGNOSIS — R000 Tachycardia, unspecified: Secondary | ICD-10-CM

## 2013-10-10 DIAGNOSIS — R5381 Other malaise: Secondary | ICD-10-CM

## 2013-10-10 DIAGNOSIS — E10649 Type 1 diabetes mellitus with hypoglycemia without coma: Secondary | ICD-10-CM

## 2013-10-10 DIAGNOSIS — E669 Obesity, unspecified: Secondary | ICD-10-CM

## 2013-10-10 DIAGNOSIS — E1043 Type 1 diabetes mellitus with diabetic autonomic (poly)neuropathy: Secondary | ICD-10-CM

## 2013-10-10 DIAGNOSIS — E1069 Type 1 diabetes mellitus with other specified complication: Secondary | ICD-10-CM

## 2013-10-10 DIAGNOSIS — R5383 Other fatigue: Secondary | ICD-10-CM

## 2013-10-10 DIAGNOSIS — K3184 Gastroparesis: Secondary | ICD-10-CM

## 2013-10-10 LAB — POCT GLYCOSYLATED HEMOGLOBIN (HGB A1C): HEMOGLOBIN A1C: 9.4

## 2013-10-10 LAB — GLUCOSE, POCT (MANUAL RESULT ENTRY): POC Glucose: 222 mg/dl — AB (ref 70–99)

## 2013-10-10 MED ORDER — TETANUS-DIPHTH-ACELL PERTUSSIS 5-2.5-18.5 LF-MCG/0.5 IM SUSP: 0.5000 mL | Freq: Once | INTRAMUSCULAR | Status: DC

## 2013-10-10 MED ORDER — LIRAGLUTIDE 18 MG/3ML ~~LOC~~ SOPN: PEN_INJECTOR | SUBCUTANEOUS | Status: DC

## 2013-10-10 NOTE — Patient Instructions (Addendum)
Follow up visit in one month. Start Victoza, 0.6 mg/day. Advance by one click every 3-4 days as tolerated.

## 2013-10-10 NOTE — Progress Notes (Signed)
Subjective:  Patient Name: Alexa [name] Date of Birth: 06-04-1960  MRN: [id number]  Alexa [name]  presents to the office today for follow-up of her type 2 diabetes mellitus, obesity, combined hyperlipidemia, GERD, hypertension, ASHD, dyspepsia, pedal edema, non-proliferative diabetic retinopathy, goiter, depression, autonomic neuropathy, tachycardia, peripheral neuropathy, vitamin D deficiency, hypocalcemia, secondary hyperparathyroidism, glossitis, pallor, fatigue, iron deficiency anemia, and status post Roux-en-Y gastric bypass.   HISTORY OF PRESENT ILLNESS:   Alexa [name] is a 53 y.o. Caucasian woman. [name] was unaccompanied.   1. The patient first presented to me on 08/20/04 in referral from her primary care internist, Dr. Emeline General, for evaluation and management of type 2 diabetes, obesity, and multiple medical issues. She was 53 years old.   A. Patient had a long history of obesity. She was heavy as a child. She underwent menarche at age 81. At age 84 she was 180 pounds. In 1996 she weighed 218 pounds. In 1999 she was diagnosed with type 2 diabetes mellitus. Her weight at that point was 250 pounds. She was treated with Glucophage, and Actos. Actos made her gain even more weight. She has also been treated with glipizide in the past. More recently she had been treated with Lantus and Glucophage plus regular insulin as needed for when she took steroids for asthma attacks. Maximum weight had been 296 pounds one month prior to that first visit. Her tendency to gain weight and her difficulty in losing weight were aggravated by long-standing, intermittent depression and by severe, recurrent asthma requiring the use of steroid medications.  B. Past medical history was also positive for a 60% blockage in one of her coronary arteries. She had significant issues with GERD and dyspepsia. She also had combined hyperlipidemia. She had a previous cardiac catheterization and previous tonsillectomy. She was  allergic to theophylline. Her pertinent review of systems was positive for some numbness and tingling in her feet. Family history was positive for type 2 diabetes in her father and her maternal grandmother. Her brother weighed 250 pounds at a height of 76 inches. Both her mother and her sister were hypothyroid.  C. On physical examination, her weight was 279.9 pounds. Her BMI was 49.6. Her blood pressure was 142/86. Her heart rate was 96. Her hemoglobin A1c was 9.8%. She was alert and oriented x3. Her affect was normal. Her insight was fair. She was obviously quite heavy. She had a 25 gm thyroid gland. She had 1+ tremor of her hands. She had 1+ DP pulses and 2+ tinea pedis. Sensation to touch was intact in her feet. Laboratory data included a normal CMP. Her cholesterol was 175, triglycerides 76, HDL 53, and LDL 107. Her TSH was 2.98. Since she obviously did have type 2 diabetes mellitus associated with morbid obesity, and since weight loss was a major factor for her, I asked her to resume her metformin twice daily. I also started her on Byetta, initially 5 mcg twice daily and then 10 mcg twice daily. I discontinued her Lantus insulin.  2. During the last 9 years,we have had some major successes and some new problem areas.  A. T2DM: In October 2007 the combination of Byetta and metformin was causing more gastrointestinal problems. Patient opted to stop the metformin and continue the Byetta because it was helping her with weight control and blood sugar control. By 08/11/06 her weight had decreased to 267.6 pounds. Hemoglobin A1c was 8.6%. At that point she decided to have bariatric surgery. She had a Roux-en-Y gastric  bypass on 12/20/2006. She subsequently lost weight down to 173.4 pounds on 05/23/08, but then subsequently regained weight to the 190s. Her  hemoglobin A1c values varied in parallel with her weight. On 05/23/08, at the point of her lowest weight, her hemoglobin A1c dropped to a nadir of 6.3%.  Since  then her hemoglobin A1c values have varied between 6.6 and 10.3%. Although we were initially able to stop all of her diabetes medicines after bariatric surgery, when she began to regain weight we restarted metformin, 500 mg twice daily. Although she took her metformin twice daily for a long time, she had discontinued it many months ago. She hated to take medicines and could not make herself be compliant in taking medications or exercising..   B. Vitamin D deficiency, hypocalcemia, and secondary hyperparathyroidism: Just before her gastric bypass, I obtained baseline bone mineral metabolism studies. Her 25-hydroxy vitamin D was 7 (normal greater than or equal to 30). Her calcium was 9.5 (normal 8.6-10.6). Her parathyroid hormone was 38.4 (normal 14-72). Subsequent to her surgery, the patient was supposed to be taking multivitamins with calcium and vitamin D, but did not always do so. On 03/29/2007 her 25-hydroxy vitamin D was 29, but her calcium decreased to 8.7. Her PTH was slightly elevated at 73.5. Her 1,25-hydroxy vitamin D was 46. Her iron was 56. I asked her to make sure she took her multivitamins and calcium daily. Unfortunately, she has also not been compliant with her multivitamins and calcium. Her 25-hydroxy vitamin D values have varied between 19-27. Her calcium values have varied between 8.1-9.4. Her PTH values have remained elevated between 82.9-167. The patient's iron levels have also been low, between 32-34. She is supposed to be taking iron every day as well. Unfortunately, since August of 2014 she has discontinued all vitamins and supplements.   3. The patient's last [facility] visit was on 05/08/13. She was taking Bydureon weekly dosing at that visit, but had a lot of nausea. She was changed to daily Victoza at last visit, but did not take it. She had also started seeing Ms. Rea [employer] for counseling.  A. In the interim, she has had asthma/bronchitis twice and took steroids both times. She has  resumed being non-compliant with her DM meds and with her other meds.  She has re-gained several pounds. She is more tired again. She is hungrier now.    B. She continues to see Ms. Rea [employer], [place]. Her anxiety and mood are worse since stopping Lexapro. .   C. She stopped her two Citracal-D tablets per day. She takes her tryptophan occasionally.   D. She and her husband did not join the [employer], but will.  E. Her anxiety and depression are worse than in April, but better since she began to see Ms. Showfety. She is still somewhat anhedonic.   F. She has been having more dental cavities. She will need a root canal procedure soon.   G. Hot flashes are more frequent.  H. She hasn't started exercising yet.   4. Pertinent Review of Systems:  Constitutional: The patient feels "much more tired, anxious, and depressed."   Eyes: Vision is good. There are no significant eye complaints. She had an eye exam in April or May 2014. That exam showed no signs of diabetic retinopathy. She is due for a follow up exam.  Neck: Her swallowing had improved, but she is now having more sensation of food sticking in her throat again. She has no complaints of anterior  neck swelling, soreness, tenderness,  pressure, or discomfort.  Heart: She occasionally has chest wall pains. Heart rate increases with exercise or other physical activity. She has no complaints of palpitations, irregular heat beats, other chest pain, or chest pressure. Gastrointestinal: She does not notice much reflux or acid indigestion. She rarely has problems with reflux. She rarely feels something uncomfortable in her left upper ribcage. Abdomen has not been tender to palpation. Bowel movents seem normal. The patient has no complaints of excessive hunger, acid reflux, upset stomach, stomach aches or pains, diarrhea, or constipation. Legs: Muscle mass and strength seem normal. Leg cramps have increased in frequency somewhat recently. There are  no other complaints of numbness, tingling, burning, or pain. She occasionally has mild edema. Feet: There are no obvious foot problems. There are no complaints of numbness, tingling, burning, or pain. No edema is noted. Psychological: She is not doing as well. There is still not enough joy in her life.  Hypoglycemia: None  4. BG printout: She is not routinely checking her BGs, so she did not bring in her meter today.   PAST MEDICAL, FAMILY, AND SOCIAL HISTORY:  Past Medical History  Diagnosis Date  . Diabetes mellitus   . Asthma   . Hypertension   . GERD (gastroesophageal reflux disease)   . CAD (coronary artery disease)   . Elevated homocysteine   . Chronic insomnia   . Diabetes mellitus type II   . Obesity, Class III, BMI 40-49.9 (morbid obesity)   . Combined hyperlipidemia   . Asthma, chronic   . GERD (gastroesophageal reflux disease)   . Dyspepsia   . Nonproliferative diabetic retinopathy associated with type 2 diabetes mellitus     RESOLVED  . Goiter   . Depression   . Diabetic autonomic neuropathy   . DM type 2 with diabetic peripheral neuropathy   . Vitamin D deficiency   . Hyperparathyroidism , secondary, non-renal   . Tachycardia   . Glossitis   . Thyroiditis, autoimmune   . Fatigue   . Pallor   . Anemia, iron deficiency   . Essential tremor     Family History  Problem Relation Age of Onset  . Thyroid disease Mother     Hypothyroid  . Diabetes Father     T2 DM  . Obesity Brother     250 pounds at a height of 76 inches.  . Thyroid disease Sister     Hypothyroid  . Diabetes Maternal Grandmother     Current outpatient prescriptions:albuterol (PROVENTIL HFA;VENTOLIN HFA) 108 (90 BASE) MCG/ACT inhaler, Inhale 2 puffs into the lungs every 6 (six) hours as needed for wheezing or shortness of breath., Disp: 1 Inhaler, Rfl: 2;  cyclobenzaprine (FLEXERIL) 10 MG tablet, Take 1 tablet (10 mg total) by mouth 3 (three) times daily as needed for muscle spasms., Disp:  270 tablet, Rfl: 3 HYDROcodone-acetaminophen (NORCO/VICODIN) 5-325 MG per tablet, Take 1 tablet by mouth every 6 (six) hours as needed for moderate pain., Disp: 60 tablet, Rfl: 0;  metFORMIN (GLUCOPHAGE) 500 MG tablet, Take 1 tablet (500 mg total) by mouth 2 (two) times daily with a meal., Disp: 180 tablet, Rfl: 4;  Calcium Carbonate-Vitamin D (CALCIUM-VITAMIN D) 500-200 MG-UNIT per tablet, Take 1 tablet by mouth 2 (two) times daily with a meal.  , Disp: , Rfl:  conjugated estrogens (PREMARIN) vaginal cream, Place 1 Applicatorful vaginally daily. Place i applicatorful vaginally q 3 days, Disp: 42.5 g, Rfl: 12;  escitalopram (LEXAPRO) 20 MG tablet, Take  20 mg by mouth daily., Disp: , Rfl: ;  esomeprazole (NEXIUM) 40 MG capsule, Take 1 capsule (40 mg total) by mouth daily before breakfast., Disp: 90 capsule, Rfl: 3 ferrous fumarate (HEMOCYTE - 106 MG FE) 325 (106 FE) MG TABS tablet, Take 1 tablet (106 mg of iron total) by mouth 2 (two) times daily., Disp: 180 each, Rfl: 3;  Fluticasone-Salmeterol (ADVAIR DISKUS) 250-50 MCG/DOSE AEPB, Inhale 1 puff into the lungs 2 (two) times daily., Disp: 1 each, Rfl: 3;  glucose blood (ACCU-CHEK SMARTVIEW) test strip, Check sugar 6 x daily, Disp: 600 each, Rfl: 4 Multiple Vitamin (MULTIVITAMIN) tablet, Take 1 tablet by mouth daily.  , Disp: , Rfl: ;  ondansetron (ZOFRAN) 8 MG tablet, Take 1 tablet (8 mg total) by mouth every 8 (eight) hours as needed for nausea or vomiting., Disp: 30 tablet, Rfl: 1;  promethazine (PHENERGAN) 25 MG tablet, Take 1-2 tablets twice daily for nausea, Disp: 60 tablet, Rfl: 3;  telmisartan (MICARDIS) 40 MG tablet, Take 1 tablet (40 mg total) by mouth daily., Disp: 90 tablet, Rfl: 3 TRYPTOPHAN PO, Take by mouth. Take 4 tablets ay bedtime, Disp: , Rfl:   Allergies as of 10/10/2013 - Review Complete 10/10/2013  Allergen Reaction Noted  . Theophyllines Palpitations 05/13/2010    1. 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 2. Activities: She has not been swimming in long time. She will schedule some vacation time soon.   3. Smoking, alcohol, or drugs: None 4. Primary Care Provider: Dr. Tommie Ard Baxley 5. Therapist: Ms. Rea [employer], [place]  REVIEW OF SYSTEM: There are no other significant problems involving Ellenora's other body systems.   Objective:  Vital Signs:  BP 149/102  Pulse 96  Wt 203 lb (92.08 kg)  LMP 05/11/2010   Ht Readings from Last 3 Encounters:  06/12/13 5\' 4"  (1.626 m)  10/04/12 5\' 3"  (1.6 m)  09/13/12 5\' 3"  (1.6 m)   Wt Readings from Last 3 Encounters:  10/10/13 203 lb (92.08 kg)  07/27/13 200 lb (90.719 kg)  06/12/13 199 lb 8 oz (90.493 kg)   PHYSICAL EXAM:  Constitutional: She has gained 4 lbs in the past 5 months. She is more cognitively upset with herself today, but her affect and insight are good overall. She is not overtly depressed or anxious today.  Eyes: There is no arcus or proptosis.  Mouth: The oropharynx appears normal. The tongue appears normal. There is normal oral moisture. There is no obvious gingivitis. Neck: There are no bruits present. The thyroid gland appears normal in size. The thyroid gland is smaller at approximately 20 grams in size. The consistency of the thyroid gland is normal. There is no thyroid tenderness to palpation. Lungs: The lungs are clear. Air movement is good. Heart: The heart rhythm and rate appear normal. Heart sounds S1 and S2 are normal. I do not appreciate any pathologic heart murmurs. Abdomen: The abdomen is enlarged. Bowel sounds are normal. The abdomen is soft and non-tender. There is no obviously palpable hepatomegaly, splenomegaly, or other masses.  Arms: Muscle mass appears appropriate for age.  Hands: There is 1+ tremor. Phalangeal and metacarpophalangeal joints appear normal. Palms are normal. Legs: Muscle mass appears appropriate for  age. There is no edema.  Feet: There are no significant deformities. Dorsalis pedis pulses are normal 1+ bilaterally.  Neurologic: Muscle strength is normal  for age and gender  in both the upper and the lower extremities. Muscle tone appears normal. Sensation to touch is normal in the legs and feet.   LAB DATA:  Labs today: HbA1c is 9.4% today, compared with 8.1% at last visit and with 9.3% in December.   Labs 05/08/13: HbA1c 8.1%; PTH 90.2, calcium 9.5, 25-hydroxy vitamin D 46; CMP normal except for glucose of 215; WBC 8.0, RBC 5.20, Hgb 12.5, Hct 37.8%, MCV 72.7 (normal 78-100), iron 55  Labs 02/23/13: CMP normal, except glucose 194 and alkaline phosphatase 126; microalbumin/creatinine ratio was 11.5; TSH 2.086, free T4 1.29, free T3 3.2, TPO antibody < 10; 25-hydroxy vitamin D 18; C-peptide 1.79; cholesterol 156, triglycerides 64, HDL 61, LDL 82  Labs 01/05/13: Hemoglobin A1c was 9.3%, compared with 9.3 at last visit and with 9.1% at the visit prior. All of these values were major increases from 6.6% in May 2012.    Labs 04/14/12: 25-hydroxy vitamin D 21, iron 203,  Vitamin B12 379,   Labs 11/16/11: Hgb 10.3, Hct 32.4%; CMP normal except glucose 146' cholesterol 201, triglycerides 70, HDL 66, LDL 121; vitamin D 22           Labs 06/09/10: Cholesterol was 175, triglycerides 76, HDL 62, LDL 98. TSH was 1.217. Free T4 was 1.10. Free T3 was 2.7. 25-hydroxy vitamin D was 19. PTH was 167.   Assessment and Plan:   ASSESSMENT:  1. Type 2 diabetes mellitus: BG control has deteriorated after she discontinued her meds. Mickenzie stopped doing a good job of taking her medications and managing her DM. Unfortunately, she was having severe GI adverse effects from Bydureon, which had one fixed dose. She may benefit more from using Victoza. The disadvantage of Victoza is that she must take it daily. The advantage of Victoza is that there are 23 dosing increments. She still has the sample pen I gave her at last  visit.  2. Hypoglycemia: None 3. Obesity: She has re-gained 4 pounds since last visit.  4. Autonomic neuropathy and tachycardia: The patient's autonomic neuropathy and  heart rate essentially parallel her hemoglobin A1c, but with a lag period of 6-12 months. These problems are reversible with improved BG control. 5. Goiter: The thyroid glandis smaller today. The patient was euthyroid in May 2012 and again in January 2015.  6.  Vitamin D deficiency, hypocalcemia, and secondary hyperparathyroidism: Her May 2012 value for PTH was the highest that we've seen. She had been much more compliant in taking her Citracal-D at last visit, resulting in a normal Vitamin D, a mid-normal serum calcium, and a lower, but still elevated PTH.  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  Patients such as Ms. Kreft have to work harder to take in all of her multivitamins, calcium, and vitamin D. While there have been periods of time when she has been very compliant with her medical regimen, she has not been very compliant since her last visit. 7. Hypertension: Her BP is much worse today. She is in great danger of having a stroke or developing CHF, and  hypertensive renal disease. She needs to continue her telmisartan. She also needs to try to get exercise regularly. 8. Fatigue, poor sleep, hot flashes, memory problems, attention problems: When she was taking her meds, had better BGs, and was clearer mentally, she felt great. Now she has had recurrence of all of these symptoms, but not to  the severe extent she had before.  8. Anhedonia and anxiety: She is not doing as well s at last visit, but is dong better than she was in 2013. She needs to resume her Lexapro.  9. Difficulty swallowing/gastroparesis: This problem is more frequent and severe. She likely has more gastroparesis.   PLAN:  1. Diagnostic: HbA1c today. Check annual  surveillance labs, calcium, PTH, and 25-hydroxy vitamin D before next visit.  2. Therapeutic: Start Victoza. 0.6 mg/day. Advance dose by one click every 3-4 days. Be as compliant as possible with all of her medications and supplements. Try to fit in exercise daily. Put up signs when she feels good.  3. Patient education: She must take her medications and supplements. 4. Follow-up: 4 weeks  Level of Service: This visit lasted in excess of 60 minutes. More than 50% of the visit was devoted to counseling.   Sherrlyn Hock, MD 10/10/2013 10:54 AM

## 2013-11-16 ENCOUNTER — Encounter: Payer: Self-pay | Admitting: "Endocrinology

## 2013-11-16 ENCOUNTER — Ambulatory Visit (INDEPENDENT_AMBULATORY_CARE_PROVIDER_SITE_OTHER): Payer: 59 | Admitting: "Endocrinology

## 2013-11-16 VITALS — BP 148/104 | HR 80 | Wt 205.0 lb

## 2013-11-16 DIAGNOSIS — R5383 Other fatigue: Secondary | ICD-10-CM

## 2013-11-16 DIAGNOSIS — I10 Essential (primary) hypertension: Secondary | ICD-10-CM

## 2013-11-16 DIAGNOSIS — E049 Nontoxic goiter, unspecified: Secondary | ICD-10-CM

## 2013-11-16 DIAGNOSIS — E1310 Other specified diabetes mellitus with ketoacidosis without coma: Secondary | ICD-10-CM

## 2013-11-16 DIAGNOSIS — R4584 Anhedonia: Secondary | ICD-10-CM

## 2013-11-16 DIAGNOSIS — E1143 Type 2 diabetes mellitus with diabetic autonomic (poly)neuropathy: Secondary | ICD-10-CM

## 2013-11-16 DIAGNOSIS — E1110 Type 2 diabetes mellitus with ketoacidosis without coma: Secondary | ICD-10-CM

## 2013-11-16 DIAGNOSIS — E559 Vitamin D deficiency, unspecified: Secondary | ICD-10-CM

## 2013-11-16 DIAGNOSIS — E11649 Type 2 diabetes mellitus with hypoglycemia without coma: Secondary | ICD-10-CM

## 2013-11-16 LAB — GLUCOSE, POCT (MANUAL RESULT ENTRY): POC Glucose: 249 mg/dl — AB (ref 70–99)

## 2013-11-16 NOTE — Patient Instructions (Signed)
Follow up visit in two months. Please have lab tests done one week prior to next visit. Increase Victoza by one click every 2-3 days as tolerated, up to a max dose of 1.2. Please check BGs several times per week,sometimes in the mornings and sometimes at dinner.

## 2013-11-16 NOTE — Progress Notes (Signed)
Subjective:  Patient Name: Alexa [name] Date of Birth: October 31, 1960  MRN: [id number]  Alexa [name]  presents to the office today for follow-up of her type 2 diabetes mellitus, obesity, combined hyperlipidemia, GERD, hypertension, ASHD, dyspepsia, pedal edema, non-proliferative diabetic retinopathy, goiter, depression, autonomic neuropathy, tachycardia, peripheral neuropathy, vitamin D deficiency, hypocalcemia, secondary hyperparathyroidism, glossitis, pallor, fatigue, iron deficiency anemia, disinclination to take medicines, and status post Roux-en-Y gastric bypass.   HISTORY OF PRESENT ILLNESS:   Alexa [name] is a 53 y.o. Caucasian woman. Alexa [name] was unaccompanied.  1. The patient first presented to me on 08/20/04 in referral from her primary care internist, Dr. Emeline General, for evaluation and management of type 2 diabetes, obesity, and multiple medical issues. She was 53 years old.   A. Patient had a long history of obesity. She was heavy as a child. She underwent menarche at age 35. At age 66 she was 180 pounds. In 1996 she weighed 218 pounds. In 1999 she was diagnosed with type 2 diabetes mellitus. Her weight at that point was 250 pounds. She was treated with Glucophage, and Actos. Actos made her gain even more weight. She had also been treated with glipizide in the past. More recently she had been treated with Lantus and Glucophage plus regular insulin as needed for when she took steroids for asthma attacks. Maximum weight had been 296 pounds one month prior to that first visit. Her tendency to gain weight and her difficulty in losing weight were aggravated by long-standing, intermittent depression and by severe, recurrent asthma requiring the use of steroid medications.  B. Past medical history was also positive for a 60% blockage in one of her coronary arteries. She had significant issues with GERD and dyspepsia. She also had combined hyperlipidemia. She had a previous cardiac catheterization and  previous tonsillectomy. She was allergic to theophylline. Her pertinent review of systems was positive for some numbness and tingling in her feet. Family history was positive for type 2 diabetes in her father and her maternal grandmother. Her brother weighed 250 pounds at a height of 76 inches. Both her mother and her sister were hypothyroid.  C. On physical examination, her weight was 279.9 pounds. Her BMI was 49.6. Her blood pressure was 142/86. Her heart rate was 96. Her hemoglobin A1c was 9.8%. She was alert and oriented x3. Her affect was normal. Her insight was fair. She was obviously quite heavy. She had a 25 gm thyroid gland. She had 1+ tremor of her hands. She had 1+ DP pulses and 2+ tinea pedis. Sensation to touch was intact in her feet. Laboratory data included a normal CMP. Her cholesterol was 175, triglycerides 76, HDL 53, and LDL 107. Her TSH was 2.98. Since she obviously did have type 2 diabetes mellitus associated with morbid obesity, and since weight loss was a major factor for her, I asked her to resume her metformin twice daily. I also started her on Byetta, initially 5 mcg twice daily and then 10 mcg twice daily. I discontinued her Lantus insulin.  2. During the last 9 years,we have had some major successes and some new problem areas.  A. T2DM: In October 2007 the combination of Byetta and metformin was causing more gastrointestinal problems. Patient opted to stop the metformin and continue the Byetta because it was helping her with weight control and blood sugar control. By 08/11/06 her weight had decreased to 267.6 pounds. Hemoglobin A1c was 8.6%. At that point she decided to have bariatric surgery. She had  a Roux-en-Y gastric bypass on 12/20/2006. She subsequently lost weight down to 173.4 pounds on 05/23/08, but then subsequently regained weight to the 190s. Her  hemoglobin A1c values varied in parallel with her weight. On 05/23/08, at the point of her lowest weight, her hemoglobin A1c  dropped to a nadir of 6.3%.  Since then her hemoglobin A1c values have varied between 6.6 and 10.3%. Although we were initially able to stop all of her diabetes medicines after bariatric surgery, when she began to regain weight we restarted metformin, 500 mg twice daily. Although she took her metformin twice daily for a long time, she had discontinued it many months ago. She hated to take medicines and could not make herself be compliant in taking medications or exercising..   B. Vitamin D deficiency, hypocalcemia, and secondary hyperparathyroidism: Just before her gastric bypass, I obtained baseline bone mineral metabolism studies. Her 25-hydroxy vitamin D was 7 (normal greater than or equal to 30). Her calcium was 9.5 (normal 8.6-10.6). Her parathyroid hormone was 38.4 (normal 14-72). Subsequent to her surgery, the patient was supposed to be taking multivitamins with calcium and vitamin D, but did not always do so. On 03/29/2007 her 25-hydroxy vitamin D was 29, but her calcium decreased to 8.7. Her PTH was slightly elevated at 73.5. Her 1,25-hydroxy vitamin D was 46. Her iron was 56. I asked her to make sure she took her multivitamins and calcium daily. Unfortunately, she has continued to be non-compliant with her multivitamins and calcium. Her 25-hydroxy vitamin D values have varied between 19-27. Her calcium values have varied between 8.1-9.4. Her PTH values have remained elevated between 82.9-167. The patient's iron levels have also been low, between 32-34. She is supposed to be taking iron every day as well. Unfortunately, since August of 2014 she has discontinued all vitamins and supplements.   3. The patient's last [facility] visit was on 10/10/13. In the interim she has been healthy. She occasionally has chest wall pains if she does strenuous upper body activities. She has had many good counseling sessions with Alexa [name]. Alexa [name] feels that Alexa [name] is the right person for her at this time in her life.  Today she finally took her first Victoza injection. 0.6 mg/day.   A. She resumed all of her meds today, to include her Citracal-D, 2 tablets per day, Micardis, Lexapro, metformin, and ferrous fumarate. She takes her tryptophan occasionally.   B. She and her husband did not join the [employer], but will.  C. Her anxiety and depression are worse than in April, but better since she began to see Ms. Showfety. She is still somewhat anhedonic.   D. She had her root canal procedure.   E. Hot flashes are " a little worse".   F. She hasn't started exercising yet.   4. Pertinent Review of Systems:  Constitutional: The patient feels "okay today."   Eyes: Vision is good. There are no significant eye complaints. She had an eye exam in April or May 2014. That exam showed no signs of diabetic retinopathy. She is due for a follow up exam.  Neck: She does not have many swallowing problems now. She has no complaints of anterior neck swelling, soreness, tenderness,  pressure, or discomfort.  Heart: She occasionally has chest wall pains. Heart rate increases with exercise or other physical activity. She has no complaints of palpitations, irregular heat beats, other chest pain, or chest pressure. Gastrointestinal: She does not notice any reflux or acid indigestion. Abdomen has  not been tender to palpation. Bowel movents seem normal. The patient has no complaints of excessive hunger, acid reflux, upset stomach, stomach aches or pains, diarrhea, or constipation. Legs: Muscle mass and strength seem normal. Leg cramps occur at times after prolonged standing. There are no other complaints of numbness, tingling, burning, or pain. She occasionally has mild edema. Feet: There are no obvious foot problems. There are no complaints of numbness, tingling, burning, or pain. No edema is noted. Psychological: She is doing better. She has some joyful activities in her life, but not enough.  Hypoglycemia: None  4. BG  printout: She is not routinely checking her BGs, so she did not bring in her meter today.   PAST MEDICAL, FAMILY, AND SOCIAL HISTORY:  Past Medical History  Diagnosis Date  . Diabetes mellitus   . Asthma   . Hypertension   . GERD (gastroesophageal reflux disease)   . CAD (coronary artery disease)   . Elevated homocysteine   . Chronic insomnia   . Diabetes mellitus type II   . Obesity, Class III, BMI 40-49.9 (morbid obesity)   . Combined hyperlipidemia   . Asthma, chronic   . GERD (gastroesophageal reflux disease)   . Dyspepsia   . Nonproliferative diabetic retinopathy associated with type 2 diabetes mellitus     RESOLVED  . Goiter   . Depression   . Diabetic autonomic neuropathy   . DM type 2 with diabetic peripheral neuropathy   . Vitamin D deficiency   . Hyperparathyroidism , secondary, non-renal   . Tachycardia   . Glossitis   . Thyroiditis, autoimmune   . Fatigue   . Pallor   . Anemia, iron deficiency   . Essential tremor     Family History  Problem Relation Age of Onset  . Thyroid disease Mother     Hypothyroid  . Diabetes Father     T2 DM  . Obesity Brother     250 pounds at a height of 76 inches.  . Thyroid disease Sister     Hypothyroid  . Diabetes Maternal Grandmother     Current outpatient prescriptions:Calcium Carbonate-Vitamin D (CALCIUM-VITAMIN D) 500-200 MG-UNIT per tablet, Take 1 tablet by mouth 2 (two) times daily with a meal.  , Disp: , Rfl: ;  conjugated estrogens (PREMARIN) vaginal cream, Place 1 Applicatorful vaginally daily. Place i applicatorful vaginally q 3 days, Disp: 42.5 g, Rfl: 12 cyclobenzaprine (FLEXERIL) 10 MG tablet, Take 1 tablet (10 mg total) by mouth 3 (three) times daily as needed for muscle spasms., Disp: 270 tablet, Rfl: 3;  escitalopram (LEXAPRO) 20 MG tablet, Take 20 mg by mouth daily., Disp: , Rfl: ;  esomeprazole (NEXIUM) 40 MG capsule, Take 1 capsule (40 mg total) by mouth daily before breakfast., Disp: 90 capsule, Rfl:  3 ferrous fumarate (HEMOCYTE - 106 MG FE) 325 (106 FE) MG TABS tablet, Take 1 tablet (106 mg of iron total) by mouth 2 (two) times daily., Disp: 180 each, Rfl: 3;  glucose blood (ACCU-CHEK SMARTVIEW) test strip, Check sugar 6 x daily, Disp: 600 each, Rfl: 4;  Liraglutide (VICTOZA) 18 MG/3ML SOPN, Begin dose of 0.6 mg/day. Increase dose by one click every 3-4 days as tolerated., Disp: 3 pen, Rfl: 6 metFORMIN (GLUCOPHAGE) 500 MG tablet, Take 1 tablet (500 mg total) by mouth 2 (two) times daily with a meal., Disp: 180 tablet, Rfl: 4;  Multiple Vitamin (MULTIVITAMIN) tablet, Take 1 tablet by mouth daily.  , Disp: , Rfl: ;  telmisartan (MICARDIS) 40  MG tablet, Take 1 tablet (40 mg total) by mouth daily., Disp: 90 tablet, Rfl: 3;  TRYPTOPHAN PO, Take by mouth. Take 4 tablets ay bedtime, Disp: , Rfl:  albuterol (PROVENTIL HFA;VENTOLIN HFA) 108 (90 BASE) MCG/ACT inhaler, Inhale 2 puffs into the lungs every 6 (six) hours as needed for wheezing or shortness of breath., Disp: 1 Inhaler, Rfl: 2;  Fluticasone-Salmeterol (ADVAIR DISKUS) 250-50 MCG/DOSE AEPB, Inhale 1 puff into the lungs 2 (two) times daily., Disp: 1 each, Rfl: 3 HYDROcodone-acetaminophen (NORCO/VICODIN) 5-325 MG per tablet, Take 1 tablet by mouth every 6 (six) hours as needed for moderate pain., Disp: 60 tablet, Rfl: 0;  ondansetron (ZOFRAN) 8 MG tablet, Take 1 tablet (8 mg total) by mouth every 8 (eight) hours as needed for nausea or vomiting., Disp: 30 tablet, Rfl: 1;  promethazine (PHENERGAN) 25 MG tablet, Take 1-2 tablets twice daily for nausea, Disp: 60 tablet, Rfl: 3 Current facility-administered medications:Tdap (BOOSTRIX) injection 0.5 mL, 0.5 mL, Intramuscular, Once, Elby Showers, MD  Allergies as of 11/16/2013 - Review Complete 11/16/2013  Allergen Reaction Noted  . Theophyllines Palpitations 05/13/2010    1. 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 2. Activities: She has not been swimming in long time. She is due to schedule some vacation time soon.   3. Smoking, alcohol, or drugs: None 4. Primary Care Provider: Dr. Tommie Ard Baxley 5. Therapist: Ms. Rea College, MS  REVIEW OF SYSTEM: There are no other significant problems involving Daysie's other body systems.   Objective:  Vital Signs:  BP 148/104  Pulse 80  Wt 205 lb (92.987 kg)  LMP 05/11/2010   Ht Readings from Last 3 Encounters:  10/10/13 5\' 4"  (1.626 m)  06/12/13 5\' 4"  (1.626 m)  10/04/12 5\' 3"  (1.6 m)   Wt Readings from Last 3 Encounters:  11/16/13 205 lb (92.987 kg)  10/10/13 203 lb (92.08 kg)  10/10/13 203 lb (92.08 kg)   PHYSICAL EXAM:  Constitutional: She has gained 2 lbs in the past month. She is happier, more upbeat, and positive today. She is not at all depressed. She is grinning and smiling today.  Eyes: There is no arcus or proptosis.  Mouth: The oropharynx appears normal. The tongue appears normal. There is normal oral moisture. There is no obvious gingivitis. Neck: There are no bruits present. The thyroid gland appears normal in size. The thyroid gland is again within normal limits for size at approximately 20 grams. The consistency of the thyroid gland is normal. There is no thyroid tenderness to palpation. Lungs: The lungs are clear. Air movement is good. Heart: The heart rhythm and rate appear normal. Heart sounds S1 and S2 are normal. I do not appreciate any pathologic heart murmurs. Abdomen: The abdomen is enlarged. Bowel sounds are normal. The abdomen is soft and non-tender. There is no obviously palpable hepatomegaly, splenomegaly, or other masses.  Arms: Muscle mass appears appropriate for age.  Hands: There is 1+ tremor. Phalangeal and metacarpophalangeal joints appear normal. Palms are normal. Legs: Muscle mass appears appropriate for age. There is no edema.  Feet: There are no significant deformities. Dorsalis  pedis pulses are normal 1+ bilaterally.  Neurologic: Muscle strength is normal for age and gender  in both the upper and the lower extremities. Muscle tone appears normal. Sensation to touch is normal in the legs and feet.   LAB  DATA:    Labs 10/10/13: HbA1c is 9.4%, compared with 8.1% at last visit and with 9.3% in December;   Labs 05/08/13: HbA1c 8.1%; PTH 90.2, calcium 9.5, 25-hydroxy vitamin D 46; CMP normal except for glucose of 215; WBC 8.0, RBC 5.20, Hgb 12.5, Hct 37.8%, MCV 72.7 (normal 78-100), iron 55  Labs 02/23/13: CMP normal, except glucose 194 and alkaline phosphatase 126; microalbumin/creatinine ratio was 11.5; TSH 2.086, free T4 1.29, free T3 3.2, TPO antibody < 10; 25-hydroxy vitamin D 18; C-peptide 1.79; cholesterol 156, triglycerides 64, HDL 61, LDL 82  Labs 01/05/13: Hemoglobin A1c was 9.3%, compared with 9.3 at last visit and with 9.1% at the visit prior. All of these values were major increases from 6.6% in May 2012.    Labs 04/14/12: 25-hydroxy vitamin D 21, iron 203,  Vitamin B12 379,   Labs 11/16/11: Hgb 10.3, Hct 32.4%; CMP normal except glucose 146' cholesterol 201, triglycerides 70, HDL 66, LDL 121; vitamin D 22           Labs 06/09/10: Cholesterol was 175, triglycerides 76, HDL 62, LDL 98. TSH was 1.217. Free T4 was 1.10. Free T3 was 2.7. 25-hydroxy vitamin D was 19. PTH was 167.   Assessment and Plan:   ASSESSMENT:  1. Type 2 diabetes mellitus: BG control had deteriorated after she discontinued her meds. Fortunately, she has now resumed taking her meds. Victoza should be a big help.  2. Hypoglycemia: None 3. Obesity: She has re-gained 2 pounds since last visit. She has not yet resumed exercising. 4. Autonomic neuropathy and tachycardia: The patient's autonomic neuropathy and  heart rate essentially parallel her hemoglobin A1c, but with a lag period of 6-12 months. These problems are reversible with improved BG control. 5. Goiter: The thyroid gland is again within  normal limits for size. The patient was euthyroid in May 2012 and again in January 2015.  6.  Vitamin D deficiency, hypocalcemia, and secondary hyperparathyroidism: We want to repeat her labs one week prior to next visit. Her May 2012 value for PTH was the highest that we've seen. She had been much more compliant in taking her Citracal-D at last visit, resulting in a normal Vitamin D, a mid-normal serum calcium, and a lower, but still elevated PTH.  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  Patients such as Ms. Hilyer have to work harder to take in all of her multivitamins, calcium, and vitamin D. While there have been periods of time when she has been very compliant with her medical regimen, she has not been very compliant for a long time. since her last visit. 7. Hypertension: Her BP is much too high. Resuming her BP medication should help. Exercise would help even more. She needs to continue her telmisartan. She also needs to try to get exercise regularly. 8. Fatigue, poor sleep, hot flashes, memory problems, attention problems: When she was taking her meds, had better BGs, and was clearer mentally, she felt great. Now she has had recurrence of all of these symptoms, but not to the severe extent she had before. After resuming her meds for several weeks she should be much better. 8. Anhedonia and anxiety: She is doing quiet well today.  9. Difficulty swallowing/gastroparesis: This problem has greatly improved.   PLAN:  1. Diagnostic: Check annual surveillance labs, calcium, PTH, and 25-hydroxy vitamin D before next visit.  2. Therapeutic: Start Victoza. 0.6 mg/day. Advance dose  by one click every 3-4 days. Be as compliant as possible with all of her medications and supplements. Try to fit in exercise daily. Put up signs when she feels good.  3. Patient education: She must take her medications and supplements. 4.  Follow-up: 2 months  Level of Service: This visit lasted in excess of 60 minutes. More than 50% of the visit was devoted to counseling.   Sherrlyn Hock, MD 11/16/2013 4:31 PM

## 2013-12-14 ENCOUNTER — Encounter: Payer: Self-pay | Admitting: "Endocrinology

## 2014-01-04 ENCOUNTER — Ambulatory Visit: Payer: 59 | Admitting: "Endocrinology

## 2014-01-22 ENCOUNTER — Ambulatory Visit (INDEPENDENT_AMBULATORY_CARE_PROVIDER_SITE_OTHER): Payer: 59 | Admitting: Internal Medicine

## 2014-01-22 ENCOUNTER — Encounter: Payer: Self-pay | Admitting: Internal Medicine

## 2014-01-22 VITALS — BP 138/84 | HR 102 | Temp 98.8°F | Wt 202.0 lb

## 2014-01-22 DIAGNOSIS — J209 Acute bronchitis, unspecified: Secondary | ICD-10-CM

## 2014-01-22 DIAGNOSIS — E119 Type 2 diabetes mellitus without complications: Secondary | ICD-10-CM

## 2014-01-22 DIAGNOSIS — Z0000 Encounter for general adult medical examination without abnormal findings: Secondary | ICD-10-CM

## 2014-01-22 DIAGNOSIS — Z0189 Encounter for other specified special examinations: Secondary | ICD-10-CM

## 2014-01-22 DIAGNOSIS — R8290 Unspecified abnormal findings in urine: Secondary | ICD-10-CM

## 2014-01-22 DIAGNOSIS — J4531 Mild persistent asthma with (acute) exacerbation: Secondary | ICD-10-CM

## 2014-01-22 LAB — POCT URINALYSIS DIPSTICK
Blood, UA: NEGATIVE
Ketones, UA: NEGATIVE
Leukocytes, UA: NEGATIVE
Nitrite, UA: NEGATIVE
Protein, UA: NEGATIVE
Spec Grav, UA: 1.015
UROBILINOGEN UA: NEGATIVE
pH, UA: 6

## 2014-01-22 LAB — GLUCOSE, POCT (MANUAL RESULT ENTRY): POC Glucose: 141 mg/dl — AB (ref 70–99)

## 2014-01-22 MED ORDER — PREDNISONE 10 MG PO TABS: ORAL_TABLET | ORAL | Status: DC

## 2014-01-22 MED ORDER — HYDROCODONE-HOMATROPINE 5-1.5 MG/5ML PO SYRP: 5.0000 mL | ORAL_SOLUTION | Freq: Three times a day (TID) | ORAL | Status: DC | PRN

## 2014-01-22 MED ORDER — LEVOFLOXACIN 500 MG PO TABS: 500.0000 mg | ORAL_TABLET | Freq: Every day | ORAL | Status: DC

## 2014-01-22 NOTE — Patient Instructions (Addendum)
If foul smelling urine persists, make another appointment to check for vaginitis. Take Levaquin as directed and prednisone in tapering courses directed. Take Hycodan sparingly for cough.

## 2014-01-22 NOTE — Progress Notes (Signed)
   Subjective:    Patient ID: Alexa [name], female    DOB: 1960/11/05, 53 y.o.   MRN: [id number]  HPI  Patient had onset of respiratory infection symptoms last week. She had 6 tablets of steroid left over from a previous prescription and took those in a 3-2-1 taper over 3 days. No wheezing. History of asthma. Coughing a lot in the office. Says she can't get any rest because of coughing. She does have diabetes mellitus. Has not been checking Accu-Cheks. Has appointment to see endocrinologist in January.   Also complaining of foul-smelling urine. Urinalysis today remarkable for glucosuria. Accu-Chek in the office is 141.    Review of Systems     Objective:   Physical Exam  Skin warm and dry. Nodes none. TMs slightly full. Pharynx clear. Neck supple without adenopathy. Chest clear to auscultation without rales or wheezing      Assessment & Plan:  Bronchitis  History of diabetes mellitus-keep appointment in January with endocrinologist. Says she's been trying to take better care of her diabetes. Has been seeing a counselor and is taking Victoza.  Status post gastric bypass surgery  Plan: Sterapred DS 10 mg 12 day dosepak. Hycodan 1 teaspoon by mouth every 8 hours when necessary cough. Levaquin 500 milligrams daily for 10 days.  If urinary symptoms persist, she is to make another appointment. She may have bacterial vaginosis. She continues to use Premarin vaginal cream.    25 minutes spent with patient

## 2014-02-06 ENCOUNTER — Encounter: Payer: Self-pay | Admitting: Internal Medicine

## 2014-02-06 ENCOUNTER — Ambulatory Visit (INDEPENDENT_AMBULATORY_CARE_PROVIDER_SITE_OTHER): Payer: 59 | Admitting: Internal Medicine

## 2014-02-06 VITALS — BP 142/82 | HR 96 | Temp 98.3°F | Wt 201.0 lb

## 2014-02-06 DIAGNOSIS — J4541 Moderate persistent asthma with (acute) exacerbation: Secondary | ICD-10-CM

## 2014-02-06 DIAGNOSIS — Z9884 Bariatric surgery status: Secondary | ICD-10-CM

## 2014-02-06 DIAGNOSIS — E1165 Type 2 diabetes mellitus with hyperglycemia: Secondary | ICD-10-CM

## 2014-02-06 DIAGNOSIS — J209 Acute bronchitis, unspecified: Secondary | ICD-10-CM

## 2014-02-06 MED ORDER — LEVOFLOXACIN 500 MG PO TABS: 500.0000 mg | ORAL_TABLET | Freq: Every day | ORAL | Status: DC

## 2014-02-06 MED ORDER — HYDROCODONE-HOMATROPINE 5-1.5 MG/5ML PO SYRP: 5.0000 mL | ORAL_SOLUTION | Freq: Three times a day (TID) | ORAL | Status: DC | PRN

## 2014-02-06 MED ORDER — PREDNISONE 10 MG PO TABS: ORAL_TABLET | ORAL | Status: DC

## 2014-02-06 NOTE — Progress Notes (Signed)
   Subjective:    Patient ID: Alexa [name], female    DOB: August 20, 1960, 54 y.o.   MRN: [id number]  HPI Was here December 28 with exacerbation of asthma and bronchitis treated with steroids, Hycodan and Levaquin. Initially seemed to get better, but has had relapse recently. Coughing a lot in the office today. History of poorly controlled diabetes. Does not check Accu-Cheks regularly. Has had gastric bypass surgery in the past. Remains overweight. History of depression, hypertension, hyperlipidemia, recurrent low back pain.    Review of Systems     Objective:   Physical Exam  Skin warm and dry. Nodes none. TMs are slightly full. Pharynx is clear. Neck is supple without adenopathy. Chest: Occasional inspiratory wheeze      Assessment & Plan:  Relapse of asthma  Bronchitis  Plan: 12 day taper prednisone 10 mg going from 60 mg to 0 mg over 12 days. Levaquin 500 milligrams daily for 10 days. Hycodan 1 teaspoon by mouth every 8 hours when necessary cough. Use Proventil inhaler 4 times daily

## 2014-02-14 LAB — MICROALBUMIN / CREATININE URINE RATIO
CREATININE, URINE: 201.7 mg/dL
Microalb Creat Ratio: 15.9 mg/g (ref 0.0–30.0)
Microalb, Ur: 3.2 mg/dL — ABNORMAL HIGH (ref <2.0)

## 2014-02-14 LAB — COMPREHENSIVE METABOLIC PANEL
ALBUMIN: 3.8 g/dL (ref 3.5–5.2)
ALK PHOS: 74 U/L (ref 39–117)
ALT: 21 U/L (ref 0–35)
AST: 17 U/L (ref 0–37)
BILIRUBIN TOTAL: 0.5 mg/dL (ref 0.2–1.2)
BUN: 10 mg/dL (ref 6–23)
CO2: 29 mEq/L (ref 19–32)
Calcium: 9 mg/dL (ref 8.4–10.5)
Chloride: 94 mEq/L — ABNORMAL LOW (ref 96–112)
Creat: 0.96 mg/dL (ref 0.50–1.10)
GLUCOSE: 165 mg/dL — AB (ref 70–99)
POTASSIUM: 4.2 meq/L (ref 3.5–5.3)
SODIUM: 132 meq/L — AB (ref 135–145)
TOTAL PROTEIN: 6.7 g/dL (ref 6.0–8.3)

## 2014-02-14 LAB — LIPID PANEL
CHOLESTEROL: 177 mg/dL (ref 0–200)
HDL: 90 mg/dL (ref >39)
LDL Cholesterol: 65 mg/dL (ref 0–99)
Total CHOL/HDL Ratio: 2 Ratio
Triglycerides: 108 mg/dL (ref <150)
VLDL: 22 mg/dL (ref 0–40)

## 2014-02-14 LAB — CALCIUM: CALCIUM: 9 mg/dL (ref 8.4–10.5)

## 2014-02-14 LAB — T4, FREE: Free T4: 1.3 ng/dL (ref 0.80–1.80)

## 2014-02-14 LAB — VITAMIN D 25 HYDROXY (VIT D DEFICIENCY, FRACTURES): Vit D, 25-Hydroxy: 15 ng/mL — ABNORMAL LOW (ref 30–100)

## 2014-02-14 LAB — PTH, INTACT AND CALCIUM
Calcium: 9 mg/dL (ref 8.4–10.5)
PTH: 131 pg/mL — ABNORMAL HIGH (ref 14–64)

## 2014-02-14 LAB — TSH: TSH: 3.639 u[IU]/mL (ref 0.350–4.500)

## 2014-02-14 LAB — T3, FREE: T3 FREE: 2.5 pg/mL (ref 2.3–4.2)

## 2014-02-15 ENCOUNTER — Other Ambulatory Visit: Payer: Self-pay | Admitting: *Deleted

## 2014-02-15 DIAGNOSIS — E1110 Type 2 diabetes mellitus with ketoacidosis without coma: Secondary | ICD-10-CM

## 2014-02-15 MED ORDER — LIRAGLUTIDE 18 MG/3ML ~~LOC~~ SOPN: PEN_INJECTOR | SUBCUTANEOUS | Status: DC

## 2014-02-22 ENCOUNTER — Encounter: Payer: Self-pay | Admitting: "Endocrinology

## 2014-02-22 ENCOUNTER — Ambulatory Visit (INDEPENDENT_AMBULATORY_CARE_PROVIDER_SITE_OTHER): Payer: 59 | Admitting: "Endocrinology

## 2014-02-22 VITALS — BP 117/81 | HR 86 | Wt 200.0 lb

## 2014-02-22 DIAGNOSIS — R000 Tachycardia, unspecified: Secondary | ICD-10-CM

## 2014-02-22 DIAGNOSIS — E049 Nontoxic goiter, unspecified: Secondary | ICD-10-CM

## 2014-02-22 DIAGNOSIS — E11649 Type 2 diabetes mellitus with hypoglycemia without coma: Secondary | ICD-10-CM

## 2014-02-22 DIAGNOSIS — E063 Autoimmune thyroiditis: Secondary | ICD-10-CM

## 2014-02-22 DIAGNOSIS — E1143 Type 2 diabetes mellitus with diabetic autonomic (poly)neuropathy: Secondary | ICD-10-CM

## 2014-02-22 DIAGNOSIS — I10 Essential (primary) hypertension: Secondary | ICD-10-CM

## 2014-02-22 DIAGNOSIS — R4584 Anhedonia: Secondary | ICD-10-CM

## 2014-02-22 DIAGNOSIS — E1140 Type 2 diabetes mellitus with diabetic neuropathy, unspecified: Secondary | ICD-10-CM

## 2014-02-22 DIAGNOSIS — D509 Iron deficiency anemia, unspecified: Secondary | ICD-10-CM

## 2014-02-22 DIAGNOSIS — E669 Obesity, unspecified: Secondary | ICD-10-CM

## 2014-02-22 DIAGNOSIS — E038 Other specified hypothyroidism: Secondary | ICD-10-CM

## 2014-02-22 DIAGNOSIS — E1110 Type 2 diabetes mellitus with ketoacidosis without coma: Secondary | ICD-10-CM

## 2014-02-22 DIAGNOSIS — E211 Secondary hyperparathyroidism, not elsewhere classified: Secondary | ICD-10-CM

## 2014-02-22 DIAGNOSIS — E1310 Other specified diabetes mellitus with ketoacidosis without coma: Secondary | ICD-10-CM

## 2014-02-22 DIAGNOSIS — I471 Supraventricular tachycardia: Secondary | ICD-10-CM

## 2014-02-22 DIAGNOSIS — E559 Vitamin D deficiency, unspecified: Secondary | ICD-10-CM

## 2014-02-22 LAB — GLUCOSE, POCT (MANUAL RESULT ENTRY): POC Glucose: 273 mg/dl — AB (ref 70–99)

## 2014-02-22 LAB — POCT GLYCOSYLATED HEMOGLOBIN (HGB A1C): Hemoglobin A1C: 11.6

## 2014-02-22 MED ORDER — SYNTHROID 25 MCG PO TABS: ORAL_TABLET | ORAL | Status: DC

## 2014-02-22 MED ORDER — METFORMIN HCL 500 MG PO TABS: ORAL_TABLET | ORAL | Status: DC

## 2014-02-22 MED ORDER — TELMISARTAN 40 MG PO TABS: ORAL_TABLET | ORAL | Status: DC

## 2014-02-22 MED ORDER — FERROUS FUMARATE 325 (106 FE) MG PO TABS: ORAL_TABLET | ORAL | Status: DC

## 2014-02-22 NOTE — Patient Instructions (Signed)
Follow up visit in 2 months. Please repeat lab tests one week prior to next visit.

## 2014-02-22 NOTE — Progress Notes (Signed)
Subjective:  Patient Name: Alexa [name] Date of Birth: 01/14/1961  MRN: [id number]  Alexa [name]  presents to the office today for follow-up of her type 2 diabetes mellitus, obesity, combined hyperlipidemia, GERD, hypertension, ASHD, dyspepsia, pedal edema, non-proliferative diabetic retinopathy, goiter, depression, autonomic neuropathy, tachycardia, peripheral neuropathy, vitamin D deficiency, hypocalcemia, secondary hyperparathyroidism, glossitis, pallor, fatigue, iron deficiency anemia, disinclination to take medicines, and status post Roux-en-Y gastric bypass.   HISTORY OF PRESENT ILLNESS:   Alexa [name] is a 54 y.o. Caucasian woman. Alexa [name] was unaccompanied.  1. The patient first presented to me on 08/20/04 in referral from her primary care internist, Dr. Emeline General, for evaluation and management of type 2 diabetes, obesity, and multiple medical issues. She was 54 years old.   A. Patient had a long history of obesity. She was heavy as a child. She underwent menarche at age 9. At age 69 she was 180 pounds. In 1996 she weighed 218 pounds. In 1999 she was diagnosed with type 2 diabetes mellitus. Her weight at that point was 250 pounds. She was treated with Glucophage, and Actos. Actos made her gain even more weight. She had also been treated with glipizide in the past. More recently she had been treated with Lantus and Glucophage plus regular insulin as needed for when she took steroids for asthma attacks. Maximum weight had been 296 pounds one month prior to that first visit with me. Her tendency to gain weight and her difficulty in losing weight were aggravated by long-standing, intermittent depression and by severe, recurrent asthma requiring the use of steroid medications.  B. Past medical history was also positive for a 60% blockage in one of her coronary arteries. She had significant issues with GERD and dyspepsia. She also had combined hyperlipidemia. She had a previous cardiac  catheterization and previous tonsillectomy. She was allergic to theophylline. Her pertinent review of systems was positive for some numbness and tingling in her feet. Family history was positive for type 2 diabetes in her father and her maternal grandmother. Her brother weighed 250 pounds at a height of 76 inches. Both her mother and her sister were hypothyroid.  C. On physical examination, her weight was 279.9 pounds. Her BMI was 49.6. Her blood pressure was 142/86. Her heart rate was 96. Her hemoglobin A1c was 9.8%. She was alert and oriented x3. Her affect was normal. Her insight was fair. She was obviously quite obese. She had a 25 gm thyroid gland. She had 1+ tremor of her hands. She had 1+ DP pulses and 2+ tinea pedis. Sensation to touch was intact in her feet. Laboratory data included a normal CMP. Her cholesterol was 175, triglycerides 76, HDL 53, and LDL 107. Her TSH was 2.98. Since she obviously did have type 2 diabetes mellitus associated with morbid obesity, and since weight loss was a major factor for her, I asked her to resume her metformin twice daily. I also started her on Byetta, initially 5 mcg twice daily and then 10 mcg twice daily. I discontinued her Lantus insulin.  2. During the last 9 years,we have had some major successes and some new problem areas.  A. T2DM: In October 2007 the combination of Byetta and metformin was causing more gastrointestinal problems. Patient opted to stop the metformin and continue the Byetta because it was helping her with weight control and blood sugar control. By 08/11/06 her weight had decreased to 267.6 pounds. Hemoglobin A1c was 8.6%. At that point she decided to have bariatric surgery.  She had a Roux-en-Y gastric bypass on 12/20/2006. She subsequently lost weight down to 173.4 pounds on 05/23/08, but then subsequently regained weight to the 190s. Her  hemoglobin A1c values varied in parallel with her weight. On 05/23/08, at the point of her lowest weight, her  hemoglobin A1c dropped to a nadir of 6.3%.  Since then her hemoglobin A1c values have varied between 6.6 and 10.3%. Although we were initially able to stop all of her diabetes medicines after bariatric surgery, when she began to regain weight we restarted metformin, 500 mg twice daily. Although she took her metformin twice daily for a long time, she had discontinued it many months ago. She hated to take medicines and could not make herself be compliant in taking medications or exercising. When her HbA1c increased to 9.4% in September 2015, I started her on liraglutide (Victoza) daily injections on 11/16/13.  B. Vitamin D deficiency, hypocalcemia, and secondary hyperparathyroidism: Just before her gastric bypass, I obtained baseline bone mineral metabolism studies. Her 25-hydroxy vitamin D was 7 (normal greater than or equal to 30). Her calcium was 9.5 (normal 8.6-10.6). Her parathyroid hormone was 38.4 (normal 14-72). Subsequent to her surgery, the patient was supposed to be taking multivitamins with calcium and vitamin D, but did not always do so. On 03/29/2007 her 25-hydroxy vitamin D was 29, but her calcium decreased to 8.7. Her PTH was slightly elevated at 73.5. Her 1,25-hydroxy vitamin D was 46. Her iron was 56. I asked her to make sure she took her multivitamins and calcium daily. Unfortunately, she has continued to be non-compliant with her multivitamins and calcium. Her 25-hydroxy vitamin D values have varied between 19-27. Her calcium values have varied between 8.1-9.4. Her PTH values have remained elevated between 82.9-167. The patient's iron levels have also been low, between 32-34. She is supposed to be taking iron every day as well. Unfortunately, since August of 2014 she has discontinued all vitamins and supplements.   3. The patient's last [facility] visit was on 10/27/13. In the interim she has had more asthma and required steroids. She is "working hard" with Alexa [name] and is doing much better.  Despite having the asthma over the holidays, she actually enjoyed the holidays and had a lot of joy.  Alexa [name] feels that Alexa [name] is the right person for her at this time in her life. Alexa [name] is not taking any anti-depressants now. She has been sleeping better. She has gradually increased her daily Victoza dose to 1.8 mg and feels good.   A. She says that she will resumed all of her meds today, to include her Citracal-D, 2 tablets per day. She has not been taking Lexapro, metformin, or ferrous fumarate. She takes  Micardis only intermittently. She takes her tryptophan and/or Flexeril occasionally.   B. She and her husband did not join the [employer], but will.  C. She had her root canal procedure.   D. Hot flashes are "not too bad", but still occur 2-3 times per day.   E. She hasn't started exercising yet.   4. Pertinent Review of Systems:  Constitutional: The patient feels "a little bit tired" after working for the past 5 days.   Eyes: Vision is good. There are no significant eye complaints. She had an eye exam in April or May 2014. That exam showed no signs of diabetic retinopathy. She needs to schedule a follow up exam.  Neck: She does not have many swallowing problems now. She has no complaints of  anterior neck swelling, soreness, tenderness,  pressure, or discomfort.  Heart: She occasionally has chest wall pains. Heart rate increases with exercise or other physical activity. She has no complaints of palpitations, irregular heat beats, other chest pain, or chest pressure. Gastrointestinal: She does not notice any reflux or acid indigestion. Abdomen has not been tender to palpation. Bowel movents seem normal. The patient has no complaints of excessive hunger, acid reflux, upset stomach, stomach aches or pains, diarrhea, or constipation. Legs: Muscle mass and strength seem normal. Leg cramps occur at times after prolonged standing. There are no other complaints of numbness, tingling, burning, or  pain. She occasionally has mild edema. Feet: There are no obvious foot problems. She has some intermittent gap between her right 2d and 3rd toes. There are no complaints of numbness, tingling, burning, or pain. No edema is noted. Psychological: She is doing better. She has some joyful activities in her life, but probably not enough.  Hypoglycemia: None  4. BG printout: She is not checking her BGs.   PAST MEDICAL, FAMILY, AND SOCIAL HISTORY:  Past Medical History  Diagnosis Date  . Diabetes mellitus   . Asthma   . Hypertension   . GERD (gastroesophageal reflux disease)   . CAD (coronary artery disease)   . Elevated homocysteine   . Chronic insomnia   . Diabetes mellitus type II   . Obesity, Class III, BMI 40-49.9 (morbid obesity)   . Combined hyperlipidemia   . Asthma, chronic   . GERD (gastroesophageal reflux disease)   . Dyspepsia   . Nonproliferative diabetic retinopathy associated with type 2 diabetes mellitus     RESOLVED  . Goiter   . Depression   . Diabetic autonomic neuropathy   . DM type 2 with diabetic peripheral neuropathy   . Vitamin D deficiency   . Hyperparathyroidism , secondary, non-renal   . Tachycardia   . Glossitis   . Thyroiditis, autoimmune   . Fatigue   . Pallor   . Anemia, iron deficiency   . Essential tremor     Family History  Problem Relation Age of Onset  . Thyroid disease Mother     Hypothyroid  . Diabetes Father     T2 DM  . Obesity Brother     250 pounds at a height of 76 inches.  . Thyroid disease Sister     Hypothyroid  . Diabetes Maternal Grandmother      Current outpatient prescriptions:  .  albuterol (PROVENTIL HFA;VENTOLIN HFA) 108 (90 BASE) MCG/ACT inhaler, Inhale 2 puffs into the lungs every 6 (six) hours as needed for wheezing or shortness of breath., Disp: 1 Inhaler, Rfl: 2 .  Calcium Carbonate-Vitamin D (CALCIUM-VITAMIN D) 500-200 MG-UNIT per tablet, Take 1 tablet by mouth 2 (two) times daily with a meal.  , Disp: , Rfl:   .  conjugated estrogens (PREMARIN) vaginal cream, Place 1 Applicatorful vaginally daily. Place i applicatorful vaginally q 3 days (Patient not taking: Reported on 02/22/2014), Disp: 42.5 g, Rfl: 12 .  cyclobenzaprine (FLEXERIL) 10 MG tablet, Take 1 tablet (10 mg total) by mouth 3 (three) times daily as needed for muscle spasms. (Patient not taking: Reported on 02/22/2014), Disp: 270 tablet, Rfl: 3 .  escitalopram (LEXAPRO) 20 MG tablet, Take 20 mg by mouth daily., Disp: , Rfl:  .  esomeprazole (NEXIUM) 40 MG capsule, Take 1 capsule (40 mg total) by mouth daily before breakfast. (Patient not taking: Reported on 02/06/2014), Disp: 90 capsule, Rfl: 3 .  ferrous  fumarate (HEMOCYTE - 106 MG FE) 325 (106 FE) MG TABS tablet, Take 1 tablet (106 mg of iron total) by mouth 2 (two) times daily. (Patient not taking: Reported on 02/22/2014), Disp: 180 each, Rfl: 3 .  Fluticasone-Salmeterol (ADVAIR DISKUS) 250-50 MCG/DOSE AEPB, Inhale 1 puff into the lungs 2 (two) times daily., Disp: 1 each, Rfl: 3 .  glucose blood (ACCU-CHEK SMARTVIEW) test strip, Check sugar 6 x daily, Disp: 600 each, Rfl: 4 .  HYDROcodone-acetaminophen (NORCO/VICODIN) 5-325 MG per tablet, Take 1 tablet by mouth every 6 (six) hours as needed for moderate pain. (Patient not taking: Reported on 02/22/2014), Disp: 60 tablet, Rfl: 0 .  HYDROcodone-homatropine (HYCODAN) 5-1.5 MG/5ML syrup, Take 5 mLs by mouth every 8 (eight) hours as needed for cough. (Patient not taking: Reported on 02/22/2014), Disp: 120 mL, Rfl: 0 .  levofloxacin (LEVAQUIN) 500 MG tablet, Take 1 tablet (500 mg total) by mouth daily. (Patient not taking: Reported on 02/22/2014), Disp: 10 tablet, Rfl: 0 .  Liraglutide (VICTOZA) 18 MG/3ML SOPN, Inject 1.8 mg daily, Disp: 10 pen, Rfl: 4 .  metFORMIN (GLUCOPHAGE) 500 MG tablet, Take 1 tablet (500 mg total) by mouth 2 (two) times daily with a meal. (Patient not taking: Reported on 02/22/2014), Disp: 180 tablet, Rfl: 4 .  Multiple Vitamin  (MULTIVITAMIN) tablet, Take 1 tablet by mouth daily.  , Disp: , Rfl:  .  ondansetron (ZOFRAN) 8 MG tablet, Take 1 tablet (8 mg total) by mouth every 8 (eight) hours as needed for nausea or vomiting. (Patient not taking: Reported on 02/06/2014), Disp: 30 tablet, Rfl: 1 .  predniSONE (DELTASONE) 10 MG tablet, Take in tapering course as directed. 6-6-5-5-4-4-3-3-2-2-1-1 taper (Patient not taking: Reported on 02/22/2014), Disp: 42 tablet, Rfl: 0 .  promethazine (PHENERGAN) 25 MG tablet, Take 1-2 tablets twice daily for nausea (Patient not taking: Reported on 02/22/2014), Disp: 60 tablet, Rfl: 3 .  telmisartan (MICARDIS) 40 MG tablet, Take 1 tablet (40 mg total) by mouth daily., Disp: 90 tablet, Rfl: 3 .  TRYPTOPHAN PO, Take by mouth. Take 4 tablets ay bedtime, Disp: , Rfl:   Current facility-administered medications:  .  Tdap (BOOSTRIX) injection 0.5 mL, 0.5 mL, Intramuscular, Once, Elby Showers, MD  Allergies as of 02/22/2014 - Review Complete 02/22/2014  Allergen Reaction Noted  . Theophyllines Palpitations 05/13/2010    1. 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 2. Activities: She has not been swimming in long time. She will schedule some vacation time soon.   3. Smoking, alcohol, or drugs: None 4. Primary Care Provider: Dr. Tommie Ard Baxley 5. Therapist: Ms. Rea College, Alexa [name]  REVIEW OF SYSTEM: There are no other significant problems involving Alexa [name]'s other body systems.   Objective:  Vital Signs:  BP 117/81 mmHg  Pulse 86  Wt 200 lb (90.719 kg)  LMP 05/11/2010   Ht Readings from Last 3 Encounters:  10/10/13 5\' 4"  (1.626 m)  06/12/13 5\' 4"  (1.626 m)  10/04/12 5\' 3"  (1.6 m)   Wt Readings from Last 3 Encounters:  02/22/14 200 lb (90.719 kg)  02/06/14 201 lb (91.173 kg)  01/22/14 202 lb (91.627 kg)   PHYSICAL EXAM:  Constitutional: She has lost 5 lbs since last  visit. She looks somewhat tired, but seems happier, more upbeat, and positive today. She does not appear to be depressed. She does smile and laugh today.  Eyes: There is no arcus or proptosis.  Mouth: The oropharynx appears normal. The tongue appears normal. There is normal oral moisture. There is no obvious gingivitis. Neck: There are no bruits present. The thyroid gland appears normal in size. The thyroid gland is slightly larger today at about 21 grams. The right lobe is within normal limits for size, but the left lobe is slightly enlarged. The consistency of the thyroid gland is normal. There is no thyroid tenderness to palpation. Lungs: The lungs are clear. Air movement is good. Heart: The heart rhythm and rate appear normal. Heart sounds S1 and S2 are normal. I do not appreciate any pathologic heart murmurs. Abdomen: The abdomen is enlarged. Bowel sounds are normal. The abdomen is soft and non-tender. There is no obviously palpable hepatomegaly, splenomegaly, or other masses.  Arms: Muscle mass appears appropriate for age.  Hands: There is 1+ tremor. Phalangeal and metacarpophalangeal joints appear normal. Palms are normal. Legs: Muscle mass appears appropriate for age. There is no edema.  Feet: There are no significant deformities. Dorsalis pedis pulses are normal 1+ bilaterally. She has 2+ tinea of her heels. Neurologic: Muscle strength is normal for age and gender  in both the upper and the lower extremities. Muscle tone appears normal. Sensation to touch is normal in the legs and feet.   LAB DATA:   Labs 02/22/14: HbA1c 11.6%  Labs 02/13/14: Calcium 9.0, PTH 131, 25-OH vitamin D 15; CMP normal except for glucose of 165; cholesterol 177, triglycerides 108, HDL 90, LDL 65; urinary microalbumin/creatinine ratio 15.6; TSH 3.639, free T4 1.30, free T3 2.5  Labs 10/10/13: HbA1c is 9.4%, compared with 8.1% at last visit and with 9.3% in December;   Labs 05/08/13: HbA1c 8.1%; PTH 90.2, calcium 9.5,  25-hydroxy vitamin D 46; CMP normal except for glucose of 215; WBC 8.0, RBC 5.20, Hgb 12.5, Hct 37.8%, MCV 72.7 (normal 78-100), iron 55  Labs 02/23/13: CMP normal, except glucose 194 and alkaline phosphatase 126; microalbumin/creatinine ratio was 11.5; TSH 2.086, free T4 1.29, free T3 3.2, TPO antibody < 10; 25-hydroxy vitamin D 18; C-peptide 1.79; cholesterol 156, triglycerides 64, HDL 61, LDL 82  Labs 01/05/13: Hemoglobin A1c was 9.3%, compared with 9.3 at last visit and with 9.1% at the visit prior. All of these values were major increases from 6.6% in May 2012.    Labs 04/14/12: 25-hydroxy vitamin D 21, iron 203,  Vitamin B12 379,   Labs 11/16/11: Hgb 10.3, Hct 32.4%; CMP normal except glucose 146' cholesterol 201, triglycerides 70, HDL 66, LDL 121; vitamin D 22           Labs 06/09/10: Cholesterol was 175, triglycerides 76, HDL 62, LDL 98. TSH was 1.217. Free T4 was 1.10. Free T3 was 2.7. 25-hydroxy vitamin D was 19. PTH was 167.   Assessment and Plan:   ASSESSMENT:  1. Type 2 diabetes mellitus: BG control has deteriorated after she had to take steroids for her flare up of asthma. Fortunately she is tolerating Victoza well. Adding metformin when she took steroids would have helped to control her BG a lot more. I had previously asked her to contact me if she had to take steroids, but unfortunately she did not.  2. Hypoglycemia: None 3. Obesity: She has lost 5 pounds since last visit. Her Victoza is helping her with weight loss. Unfortunately, some of that weight loss may also be due to under-insulinization. She has not yet resumed exercising. 4. Autonomic neuropathy and tachycardia: The patient's autonomic neuropathy  and  heart rate have worsened, paralleling her increase in HbA1c. These problems are reversible with improved BG control. 5-7. Goiter/thyroiditis, hypothyroid:   A. The thyroid gland is larger today, c/w recent Hashimoto's Dz activity.   B. The patient was euthyroid in May 2012  and January 2015, but is hypothyroid now. The trend line of her TSH values indicates that she is progressively losing thyrocytes. It is appropriate to start her on Synthroid, 25 mcg/day.  8-10.  Vitamin D deficiency, hypocalcemia, and secondary hyperparathyroidism:   A. Her vitamin D level is lower and her PTH level is higher.   B. The elevated PTH is managing to keep her calcium level at the 25% of the normal range, but at a cost of pulling more calcium out of bone. Her risk for developing osteoporosis is higher.  C. She had been much more compliant in taking her Citracal-D previously, resulting in a normal Vitamin D, a mid-normal serum calcium, and a lower, but still elevated PTH.  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  Patients such as Alexa [name] have to work harder to take in all of her multivitamins, calcium, and vitamin D. While there have been periods of time when she has been very compliant with her medical regimen, she has not been very compliant since April 215. 11. Hypertension: Her diastolic BP is too high. Exercise would help. She also needs to take her Micardis daily. 12. Fatigue, poor sleep, hot flashes, memory problems, attention problems: These problems are somewhat better. When she was taking her meds, had better BGs, and was clearer mentally, she felt great. She prefers to continue counseling to taking medications. As I've told her before, she can do both. 13. Anhedonia and anxiety: She is doing quiet well today.  14. Difficulty swallowing/gastroparesis: This problem has greatly improved.   PLAN:  1. Diagnostic: HbA1c at next visit. I ordered TFTs at next visit. 2. Therapeutic: Continue Victoza at 1.8 mg/day. Add synthroid, 25 mcg/day. Be as compliant as possible with all of her medications and supplements. Try to fit in exercise daily.   3. Patient education: She must take her medications  and supplements. 4. Follow-up: 2 months  Level of Service: This visit lasted in excess of 60 minutes. More than 50% of the visit was devoted to counseling.   Sherrlyn Hock, MD 02/22/2014 11:05 AM

## 2014-02-24 NOTE — Patient Instructions (Signed)
Take prednisone in a tapering course over 12 days. Take Levaquin for 10 days. Use inhaler 2 sprays 4 times daily. Take Hycodan sparingly for cough.

## 2014-03-21 DIAGNOSIS — M545 Low back pain: Secondary | ICD-10-CM

## 2014-03-21 DIAGNOSIS — J45998 Other asthma: Secondary | ICD-10-CM

## 2014-03-30 ENCOUNTER — Telehealth: Payer: Self-pay | Admitting: *Deleted

## 2014-03-30 NOTE — Telephone Encounter (Signed)
Book an appt.

## 2014-03-30 NOTE — Telephone Encounter (Signed)
Patient called and states her FMLA paperwork was approved but you had denied covering some of the dates she was out she states some of those dates were weekends when we were not open and she feels like we should cover them as her PCP . She requested to speak with you about the matter.

## 2014-03-30 NOTE — Telephone Encounter (Signed)
Spoke with patient husband he will give message to patient regarding setting up an appt

## 2014-04-05 ENCOUNTER — Ambulatory Visit (INDEPENDENT_AMBULATORY_CARE_PROVIDER_SITE_OTHER): Payer: 59 | Admitting: Internal Medicine

## 2014-04-05 ENCOUNTER — Encounter: Payer: Self-pay | Admitting: Internal Medicine

## 2014-04-05 VITALS — BP 120/72 | HR 87 | Temp 98.8°F

## 2014-04-05 DIAGNOSIS — Z8739 Personal history of other diseases of the musculoskeletal system and connective tissue: Secondary | ICD-10-CM | POA: Diagnosis not present

## 2014-04-05 DIAGNOSIS — Z8709 Personal history of other diseases of the respiratory system: Secondary | ICD-10-CM | POA: Diagnosis not present

## 2014-04-27 ENCOUNTER — Other Ambulatory Visit: Payer: Self-pay | Admitting: *Deleted

## 2014-04-27 DIAGNOSIS — E669 Obesity, unspecified: Secondary | ICD-10-CM

## 2014-05-12 LAB — T3, FREE: T3, Free: 2.3 pg/mL (ref 2.3–4.2)

## 2014-05-12 LAB — HEMOGLOBIN A1C
Hgb A1c MFr Bld: 7.7 % — ABNORMAL HIGH
Mean Plasma Glucose: 174 mg/dL — ABNORMAL HIGH

## 2014-05-12 LAB — TSH: TSH: 2.416 u[IU]/mL (ref 0.350–4.500)

## 2014-05-12 LAB — T4, FREE: FREE T4: 1.13 ng/dL (ref 0.80–1.80)

## 2014-05-15 ENCOUNTER — Encounter: Payer: Self-pay | Admitting: "Endocrinology

## 2014-05-15 ENCOUNTER — Ambulatory Visit (INDEPENDENT_AMBULATORY_CARE_PROVIDER_SITE_OTHER): Payer: 59 | Admitting: "Endocrinology

## 2014-05-15 VITALS — BP 112/78 | HR 92 | Wt 197.0 lb

## 2014-05-15 DIAGNOSIS — E559 Vitamin D deficiency, unspecified: Secondary | ICD-10-CM

## 2014-05-15 DIAGNOSIS — E038 Other specified hypothyroidism: Secondary | ICD-10-CM | POA: Diagnosis not present

## 2014-05-15 DIAGNOSIS — IMO0002 Reserved for concepts with insufficient information to code with codable children: Secondary | ICD-10-CM

## 2014-05-15 DIAGNOSIS — I471 Supraventricular tachycardia: Secondary | ICD-10-CM

## 2014-05-15 DIAGNOSIS — E1165 Type 2 diabetes mellitus with hyperglycemia: Secondary | ICD-10-CM

## 2014-05-15 DIAGNOSIS — E063 Autoimmune thyroiditis: Secondary | ICD-10-CM

## 2014-05-15 DIAGNOSIS — E669 Obesity, unspecified: Secondary | ICD-10-CM

## 2014-05-15 DIAGNOSIS — I10 Essential (primary) hypertension: Secondary | ICD-10-CM

## 2014-05-15 DIAGNOSIS — E11649 Type 2 diabetes mellitus with hypoglycemia without coma: Secondary | ICD-10-CM

## 2014-05-15 DIAGNOSIS — E049 Nontoxic goiter, unspecified: Secondary | ICD-10-CM

## 2014-05-15 DIAGNOSIS — E1143 Type 2 diabetes mellitus with diabetic autonomic (poly)neuropathy: Secondary | ICD-10-CM

## 2014-05-15 DIAGNOSIS — R000 Tachycardia, unspecified: Secondary | ICD-10-CM

## 2014-05-15 DIAGNOSIS — R4584 Anhedonia: Secondary | ICD-10-CM

## 2014-05-15 DIAGNOSIS — E211 Secondary hyperparathyroidism, not elsewhere classified: Secondary | ICD-10-CM

## 2014-05-15 LAB — GLUCOSE, POCT (MANUAL RESULT ENTRY): POC GLUCOSE: 119 mg/dL — AB (ref 70–99)

## 2014-05-15 NOTE — Progress Notes (Signed)
Subjective:  Patient Name: Alexa [name] Date of Birth: [birth year]  MRN: [id number]  Alexa [name]  presents to the office today for follow-up of her type 2 diabetes mellitus, obesity, combined hyperlipidemia, GERD, hypertension, ASHD, dyspepsia, pedal edema, non-proliferative diabetic retinopathy, goiter, depression, autonomic neuropathy, tachycardia, peripheral neuropathy, vitamin D deficiency, hypocalcemia, secondary hyperparathyroidism, glossitis, pallor, fatigue, iron deficiency anemia, disinclination to take medicines, and status post Roux-en-Y gastric bypass.   HISTORY OF PRESENT ILLNESS:   Alexa [name] is a 54 y.o. Caucasian woman. Alexa [name] was unaccompanied.  1. The patient first presented to me on 08/20/04 in referral from her primary care internist, Dr. Emeline General, for evaluation and management of type 2 diabetes, obesity, and multiple medical issues. She was 54 years old.   [name] had a long history of obesity. She was heavy as a child. She underwent menarche at age 54. At age 33 she was 180 pounds. In 1996 she weighed 218 pounds. In 1999 she was diagnosed with type 2 diabetes mellitus. Her weight at that point was 250 pounds. She was treated with Glucophage, and Actos. Actos made her gain even more weight. She had also been treated with glipizide in the past. More recently she had been treated with Lantus and Glucophage plus regular insulin as needed for when she took steroids for asthma attacks. Maximum weight had been 296 pounds one month prior to that first visit with me. Her tendency to gain weight and her difficulty in losing weight were aggravated by long-standing, intermittent depression and by severe, recurrent asthma requiring the use of steroid medications.  B. Past medical history was also positive for a 60% blockage in one of her coronary arteries. She had significant issues with GERD and dyspepsia. She also had combined hyperlipidemia. She had a previous cardiac catheterization  and previous tonsillectomy. She was allergic to theophylline. Her pertinent review of systems was positive for some numbness and tingling in her feet. Family history was positive for type 2 diabetes in her father and her maternal grandmother. Her brother weighed 250 pounds at a height of 76 inches. Both her mother and her sister were hypothyroid.  C. On physical examination, her weight was 279.9 pounds. Her BMI was 49.6. Her blood pressure was 142/86. Her heart rate was 96. Her hemoglobin A1c was 9.8%. She was alert and oriented x3. Her affect was normal. Her insight was fair. She was obviously quite obese. She had a 25 gm thyroid gland. She had 1+ tremor of her hands. She had 1+ DP pulses and 2+ tinea pedis. Sensation to touch was intact in her feet. Laboratory data included a normal CMP. Her cholesterol was 175, triglycerides 76, HDL 53, and LDL 107. Her TSH was 2.98. Since she obviously did have type 2 diabetes mellitus associated with morbid obesity, and since weight loss was a major factor for her, I asked her to resume her metformin twice daily. I also started her on Byetta, initially 5 mcg twice daily and then 10 mcg twice daily. I discontinued her Lantus insulin.  2. During the last 10 years,we have had some major successes and some new problem areas.  A. T2DM: In October 2007 the combination of Byetta and metformin was causing more gastrointestinal problems. Patient opted to stop the metformin and continue the Byetta because it was helping her with weight control and blood sugar control. By 08/11/06 her weight had decreased to 267.6 pounds. Hemoglobin A1c was 8.6%. At that point she decided to have bariatric surgery.  She had a Roux-en-Y gastric bypass on 12/20/2006. She subsequently lost weight down to 173.4 pounds on 05/23/08, but then subsequently regained weight to the 190s. Her  hemoglobin A1c values varied in parallel with her weight. On 05/23/08, at the point of her lowest weight, her hemoglobin A1c  dropped to a nadir of 6.3%.  Since then her hemoglobin A1c values have varied between 6.6 and 10.3%. Although we were initially able to stop all of her diabetes medicines after bariatric surgery, when she began to regain weight we restarted metformin, 500 mg twice daily. Although she took her metformin twice daily for a long time, she had discontinued it many months ago. She hated to take medicines and could not make herself be compliant in taking medications or exercising. When her HbA1c increased to 9.4% in September 2015, I stopped her Byetta injections and started her on liraglutide (Victoza) daily injections on 11/16/13.  B. Vitamin D deficiency, hypocalcemia, and secondary hyperparathyroidism: Just before her gastric bypass, I obtained baseline bone mineral metabolism studies. Her 25-hydroxy vitamin D was very low at 7 (normal greater than or equal to 30). Her calcium was 9.5 (normal 8.6-10.6). Her parathyroid hormone was 38.4 (normal 14-72). Subsequent to her surgery, the patient was supposed to be taking multivitamins with calcium and vitamin D, but did not always do so. On 03/29/2007 her 25-hydroxy vitamin D was 29, but her calcium decreased to 8.7. Her PTH was slightly elevated at 73.5. Her 1,25-hydroxy vitamin D was 46. Her iron was 56. I asked her to make sure she took her multivitamins and calcium daily. Unfortunately, she has often non-compliant with her multivitamins and calcium. Her 25-hydroxy vitamin D values have varied between 19-27. Her calcium values have varied between 8.1-9.4. Her PTH values have remained elevated between 82.9-167. The patient's iron levels have also been low, between 32-34. She is supposed to be taking iron every day as well. Unfortunately, in August of 2014 she discontinued all vitamins and supplements.   C. Fortunately, her recent outpatient psychological therapy with Ms. Rea College, RN, MS has resulted in Alexa [name] having a much more positive attitude about life in  general and a more positive attitude about taking her medications.  3. The patient's last [facility] visit was on 02/22/14. In the interim her asthma has not been acting up recently. She feels that her sessions with Ms. Cathy Showfety have been very beneficial and enlightening. Guiselle is now "very hopeful". She has more joy in her life and is trying to seek out opportunities for even more joy. She has been taking all of her medications, to include Lexapro, metformin twice daily, ferrous fumarate once daily, Micardis once daily, Synthroid 25 mcg/day, and Citracal-D twice daily. She increased her daily Victoza dose to 1.8 mg and feels good. She also takes tryptophan and Flexeril as needed.  She is still tired due to working too many hours at work and at home.    A. She and her husband did not join the [employer] due to their busy schedules.   B. Hot flashes are "not too bad", but still occur 2-3 times per day.   C. She hasn't started exercising yet.   4. Pertinent Review of Systems:  Constitutional: The patient feels "tired" after working for the past 5 days.   Eyes: Vision is good. There are no significant eye complaints. She had an eye exam in April or May 2014. That exam showed no signs of diabetic retinopathy. She needs to schedule a  follow up exam.  Neck: She has had more swallowing problems recently. She has no complaints of anterior neck swelling, soreness, tenderness,  pressure, or discomfort.  Heart:  Heart rate increases with exercise or other physical activity. She has no complaints of palpitations, irregular heat beats, other chest pain, or chest pressure. Gastrointestinal: She does not notice much reflux or acid indigestion. Abdomen has not been tender to palpation. Bowel movents seem normal. The patient has no complaints of excessive hunger, acid reflux, upset stomach, stomach aches or pains, diarrhea, or constipation. Legs: Muscle mass and strength seem normal.. There are no complaints  of numbness, tingling, burning, or pain. She occasionally has mild edema. Feet: There are no obvious foot problems. She has some intermittent gap between her right 2d and 3rd toes. There are no complaints of numbness, tingling, burning, or pain. No edema is noted. Psychological: She is doing much better. She has more joyful activities in her life, but probably still not enough.  Hypoglycemia: None  4. BG printout: She is not checking her BGs.   PAST MEDICAL, FAMILY, AND SOCIAL HISTORY:  Past Medical History  Diagnosis Date  . Diabetes mellitus   . Asthma   . Hypertension   . GERD (gastroesophageal reflux disease)   . CAD (coronary artery disease)   . Elevated homocysteine   . Chronic insomnia   . Diabetes mellitus type II   . Obesity, Class III, BMI 40-49.9 (morbid obesity)   . Combined hyperlipidemia   . Asthma, chronic   . GERD (gastroesophageal reflux disease)   . Dyspepsia   . Nonproliferative diabetic retinopathy associated with type 2 diabetes mellitus     RESOLVED  . Goiter   . Depression   . Diabetic autonomic neuropathy   . DM type 2 with diabetic peripheral neuropathy   . Vitamin D deficiency   . Hyperparathyroidism , secondary, non-renal   . Tachycardia   . Glossitis   . Thyroiditis, autoimmune   . Fatigue   . Pallor   . Anemia, iron deficiency   . Essential tremor     Family History  Problem Relation Age of Onset  . Thyroid disease Mother     Hypothyroid  . Diabetes Father     T2 DM  . Obesity Brother     250 pounds at a height of 76 inches.  . Thyroid disease Sister     Hypothyroid  . Diabetes Maternal Grandmother      Current outpatient prescriptions:  .  Calcium Carbonate-Vitamin D (CALCIUM-VITAMIN D) 500-200 MG-UNIT per tablet, Take 1 tablet by mouth 2 (two) times daily with a meal.  , Disp: , Rfl:  .  cyclobenzaprine (FLEXERIL) 10 MG tablet, Take 1 tablet (10 mg total) by mouth 3 (three) times daily as needed for muscle spasms., Disp: 270  tablet, Rfl: 3 .  escitalopram (LEXAPRO) 20 MG tablet, Take 20 mg by mouth daily., Disp: , Rfl:  .  ferrous fumarate (HEMOCYTE - 106 MG FE) 325 (106 FE) MG TABS tablet, Take one tablet daily., Disp: 90 each, Rfl: 3 .  glucose blood (ACCU-CHEK SMARTVIEW) test strip, Check sugar 6 x daily, Disp: 600 each, Rfl: 4 .  Liraglutide (VICTOZA) 18 MG/3ML SOPN, Inject 1.8 mg daily, Disp: 10 pen, Rfl: 4 .  metFORMIN (GLUCOPHAGE) 500 MG tablet, Take one tablet at breakfast and one at dinner., Disp: 180 tablet, Rfl: 3 .  Multiple Vitamin (MULTIVITAMIN) tablet, Take 1 tablet by mouth daily.  , Disp: , Rfl:  .  SYNTHROID 25 MCG tablet, Take one brand Synthroid tablet daily at breakfast., Disp: 90 tablet, Rfl: 3 .  telmisartan (MICARDIS) 40 MG tablet, Take one tablet each morning., Disp: 90 tablet, Rfl: 3 .  albuterol (PROVENTIL HFA;VENTOLIN HFA) 108 (90 BASE) MCG/ACT inhaler, Inhale 2 puffs into the lungs every 6 (six) hours as needed for wheezing or shortness of breath. (Patient not taking: Reported on 05/15/2014), Disp: 1 Inhaler, Rfl: 2 .  conjugated estrogens (PREMARIN) vaginal cream, Place 1 Applicatorful vaginally daily. Place i applicatorful vaginally q 3 days (Patient not taking: Reported on 02/22/2014), Disp: 42.5 g, Rfl: 12 .  esomeprazole (NEXIUM) 40 MG capsule, Take 1 capsule (40 mg total) by mouth daily before breakfast. (Patient not taking: Reported on 02/06/2014), Disp: 90 capsule, Rfl: 3 .  Fluticasone-Salmeterol (ADVAIR DISKUS) 250-50 MCG/DOSE AEPB, Inhale 1 puff into the lungs 2 (two) times daily. (Patient not taking: Reported on 05/15/2014), Disp: 1 each, Rfl: 3 .  HYDROcodone-acetaminophen (NORCO/VICODIN) 5-325 MG per tablet, Take 1 tablet by mouth every 6 (six) hours as needed for moderate pain. (Patient not taking: Reported on 02/22/2014), Disp: 60 tablet, Rfl: 0 .  ondansetron (ZOFRAN) 8 MG tablet, Take 1 tablet (8 mg total) by mouth every 8 (eight) hours as needed for nausea or vomiting. (Patient  not taking: Reported on 02/06/2014), Disp: 30 tablet, Rfl: 1 .  promethazine (PHENERGAN) 25 MG tablet, Take 1-2 tablets twice daily for nausea (Patient not taking: Reported on 02/22/2014), Disp: 60 tablet, Rfl: 3 .  TRYPTOPHAN PO, Take by mouth. Take 4 tablets ay bedtime, Disp: , Rfl:   Allergies as of 05/15/2014 - Review Complete 05/15/2014  Allergen Reaction Noted  . Theophyllines Palpitations 05/13/2010    1. 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 2. Activities: She has not been swimming in long time. She will schedule some vacation time soon.   3. Smoking, alcohol, or drugs: None 4. Primary Care Provider: Dr. Tommie Ard Baxley 5. Therapist: Ms. Rea College, MS  REVIEW OF SYSTEM: There are no other significant problems involving Neidy's other body systems.   Objective:  Vital Signs:  BP 112/78 mmHg  Pulse 92  Wt 197 lb (89.359 kg)  LMP 05/11/2010   Ht Readings from Last 3 Encounters:  10/10/13 5\' 4"  (1.626 m)  06/12/13 5\' 4"  (1.626 m)  10/04/12 5\' 3"  (1.6 m)   Wt Readings from Last 3 Encounters:  05/15/14 197 lb (89.359 kg)  02/22/14 200 lb (90.719 kg)  02/06/14 201 lb (91.173 kg)   PHYSICAL EXAM:  Constitutional: She has lost 3 lbs since last visit. She looks a bit tired, but other wise looks great. She is upbeat and positive. She is not depressed. She smiles and laughs more today. Eyes: There is no arcus or proptosis.  Mouth: The oropharynx appears normal. The tongue appears normal. There is normal oral moisture. There is no obvious gingivitis. Neck: There are no bruits present. The thyroid gland appears normal in size. The thyroid gland is smaller today and normal in size at 20 grams. The consistency of the thyroid gland is normal. There is no thyroid tenderness to palpation. Lungs: The lungs are clear. Air movement is good. Heart: The heart rhythm and  rate appear normal. Heart sounds S1 and S2 are normal. I do not appreciate any pathologic heart murmurs. Abdomen: The abdomen is enlarged. Bowel sounds are  normal. The abdomen is soft and non-tender. There is no obviously palpable hepatomegaly, splenomegaly, or other masses.  Arms: Muscle mass appears appropriate for age.  Hands: There is 1-2+ tremor. Phalangeal and metacarpophalangeal joints appear normal. Palms are normal. Legs: Muscle mass appears appropriate for age. There is no edema.  Feet: There are no significant deformities. Dorsalis pedis pulses are normal 1+ bilaterally. She has 2+ tinea of her soles. Neurologic: Muscle strength is normal for age and gender  in both the upper and the lower extremities. Muscle tone appears normal. Sensation to touch is normal in the legs and feet.   LAB DATA:   Labs 05/11/14: HbA1c was 7.7%, without any low BGs; TSH 2.416, free T4 1.13, free T3 2.3  Labs 02/22/14: HbA1c 11.6%  Labs 02/13/14: Calcium 9.0, PTH 131, 25-OH vitamin D 15; CMP normal except for glucose of 165; cholesterol 177, triglycerides 108, HDL 90, LDL 65; urinary microalbumin/creatinine ratio 15.6; TSH 3.639, free T4 1.30, free T3 2.5  Labs 10/10/13: HbA1c is 9.4%, compared with 8.1% at last visit and with 9.3% in December;   Labs 05/08/13: HbA1c 8.1%; PTH 90.2, calcium 9.5, 25-hydroxy vitamin D 46; CMP normal except for glucose of 215; WBC 8.0, RBC 5.20, Hgb 12.5, Hct 37.8%, MCV 72.7 (normal 78-100), iron 55  Labs 02/23/13: CMP normal, except glucose 194 and alkaline phosphatase 126; microalbumin/creatinine ratio was 11.5; TSH 2.086, free T4 1.29, free T3 3.2, TPO antibody < 10; 25-hydroxy vitamin D 18; C-peptide 1.79; cholesterol 156, triglycerides 64, HDL 61, LDL 82  Labs 01/05/13: Hemoglobin A1c was 9.3%, compared with 9.3 at last visit and with 9.1% at the visit prior. All of these values were major increases from 6.6% in May 2012.    Labs 04/14/12: 25-hydroxy vitamin D 21, iron 203,   Vitamin B12 379,   Labs 11/16/11: Hgb 10.3, Hct 32.4%; CMP normal except glucose 146' cholesterol 201, triglycerides 70, HDL 66, LDL 121; vitamin D 22           Labs 06/09/10: Cholesterol was 175, triglycerides 76, HDL 62, LDL 98. TSH was 1.217. Free T4 was 1.10. Free T3 was 2.7. 25-hydroxy vitamin D was 19. PTH was 167.   Assessment and Plan:   ASSESSMENT:  1. Type 2 diabetes mellitus: BG control has dramatically improved. She is tolerating Victoza and metformin well.  She is trying to be more carefull with her diet.  2. Hypoglycemia: None 3. Obesity: She has lost 3 pounds since last visit. Her Victoza is helping her with weight loss. She has not yet resumed exercising. 4. Autonomic neuropathy and tachycardia: The patient's autonomic neuropathy and  heart rate will improve the longer that she maintains better BG control.  5-7. Goiter/thyroiditis, hypothyroid:   A. The thyroid gland has shrunk back to normal today, c/w less inflammation due to Hashimoto's Dz activity.   B. The patient was euthyroid in May 2012 and January 2015, hypothyroid in January 2016, but euthyroid again now on her Synthroid dose of 25 mcg/day. .  8-10.  Vitamin D deficiency, hypocalcemia, and secondary hyperparathyroidism:   A. Her vitamin D level was lower and her PTH level was higher in January 2016.    B. Now that she has been more compliant with taking her meds, we can recheck her vitamin D, calcium, and PTH prior to her next visit. 11. Hypertension: Her BPs are better today after taking Micardis daily.  12. Fatigue, poor sleep, hot flashes, memory problems, attention problems: These problems are better  since resuming her medications. .  13. Anhedonia and anxiety: She is doing very, very well today. The therapy sessions with Ms. Showfety have worked wonders. 14. Difficulty swallowing/gastroparesis: This problem has improved.   PLAN:  1. Diagnostic: HbA1c at next visit. Repeat her 25-OH vitamin D, calcium, and  PTH prior to next visit. 2. Therapeutic: Continue medications as currently prescribed. Try to fit in exercise daily.   3. Patient education: She must take her medications and supplements. 4. Follow-up: 2 months  Level of Service: This visit lasted in excess of 60 minutes. More than 50% of the visit was devoted to counseling.   Sherrlyn Hock, MD 05/15/2014 2:29 PM

## 2014-05-15 NOTE — Patient Instructions (Signed)
Follow up visit in 2 months. Please have lab tests drawn one week prior to next visit.

## 2014-05-17 DIAGNOSIS — R4584 Anhedonia: Secondary | ICD-10-CM | POA: Insufficient documentation

## 2014-05-21 ENCOUNTER — Other Ambulatory Visit: Payer: Self-pay | Admitting: *Deleted

## 2014-05-21 DIAGNOSIS — E1140 Type 2 diabetes mellitus with diabetic neuropathy, unspecified: Secondary | ICD-10-CM

## 2014-05-21 DIAGNOSIS — E1110 Type 2 diabetes mellitus with ketoacidosis without coma: Secondary | ICD-10-CM

## 2014-05-21 MED ORDER — LIRAGLUTIDE 18 MG/3ML ~~LOC~~ SOPN: PEN_INJECTOR | SUBCUTANEOUS | Status: DC

## 2014-05-26 NOTE — Patient Instructions (Signed)
FMLA form has been amended as requested. See copy

## 2014-05-26 NOTE — Progress Notes (Signed)
   Subjective:    Patient ID: Alexa [name], female    DOB: 1960/12/01, 53 y.o.   MRN: [id number]  HPI  There is some issues with her FMLA form. She was out with back pain and asthma. There was some days that she was out that apparently were not improved recently. She has a long-standing history of back pain and asthma. 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 Spent 15 minutes going over days of work best with her and reasons why work was missed and correcting FMLA form as requested      Assessment & Plan:  History of back pain  History of asthma  Plan: FMLA form corrected as requested

## 2014-06-12 ENCOUNTER — Other Ambulatory Visit: Payer: 59 | Admitting: Internal Medicine

## 2014-06-12 DIAGNOSIS — Z130 Encounter for screening for diseases of the blood and blood-forming organs and certain disorders involving the immune mechanism: Secondary | ICD-10-CM

## 2014-06-12 DIAGNOSIS — Z13220 Encounter for screening for lipoid disorders: Secondary | ICD-10-CM

## 2014-06-12 DIAGNOSIS — Z0000 Encounter for general adult medical examination without abnormal findings: Secondary | ICD-10-CM

## 2014-06-12 DIAGNOSIS — E119 Type 2 diabetes mellitus without complications: Secondary | ICD-10-CM

## 2014-06-12 DIAGNOSIS — Z1329 Encounter for screening for other suspected endocrine disorder: Secondary | ICD-10-CM

## 2014-06-12 DIAGNOSIS — Z1321 Encounter for screening for nutritional disorder: Secondary | ICD-10-CM

## 2014-06-12 LAB — HEMOGLOBIN A1C
Hgb A1c MFr Bld: 7.3 % — ABNORMAL HIGH (ref <5.7)
MEAN PLASMA GLUCOSE: 163 mg/dL — AB (ref <117)

## 2014-06-12 LAB — COMPLETE METABOLIC PANEL WITH GFR
ALBUMIN: 3.8 g/dL (ref 3.5–5.2)
ALT: 16 U/L (ref 0–35)
AST: 17 U/L (ref 0–37)
Alkaline Phosphatase: 55 U/L (ref 39–117)
BUN: 13 mg/dL (ref 6–23)
CO2: 23 mEq/L (ref 19–32)
Calcium: 8.5 mg/dL (ref 8.4–10.5)
Chloride: 102 mEq/L (ref 96–112)
Creat: 0.85 mg/dL (ref 0.50–1.10)
GFR, EST NON AFRICAN AMERICAN: 78 mL/min
GFR, Est African American: >89 mL/min
GLUCOSE: 122 mg/dL — AB (ref 70–99)
Potassium: 4.5 mEq/L (ref 3.5–5.3)
Sodium: 139 mEq/L (ref 135–145)
Total Bilirubin: 0.4 mg/dL (ref 0.2–1.2)
Total Protein: 6.1 g/dL (ref 6.0–8.3)

## 2014-06-12 LAB — LIPID PANEL
Cholesterol: 167 mg/dL (ref 0–200)
HDL: 75 mg/dL (ref >=46)
LDL CALC: 81 mg/dL (ref 0–99)
Total CHOL/HDL Ratio: 2.2 Ratio
Triglycerides: 57 mg/dL (ref <150)
VLDL: 11 mg/dL (ref 0–40)

## 2014-06-12 LAB — CBC WITH DIFFERENTIAL/PLATELET
Basophils Absolute: 0 10*3/uL (ref 0.0–0.1)
Basophils Relative: 1 % (ref 0–1)
EOS PCT: 2 % (ref 0–5)
Eosinophils Absolute: 0.1 10*3/uL (ref 0.0–0.7)
HCT: 37.6 % (ref 36.0–46.0)
HEMOGLOBIN: 12.8 g/dL (ref 12.0–15.0)
LYMPHS ABS: 2 10*3/uL (ref 0.7–4.0)
LYMPHS PCT: 44 % (ref 12–46)
MCH: 28.3 pg (ref 26.0–34.0)
MCHC: 34 g/dL (ref 30.0–36.0)
MCV: 83 fL (ref 78.0–100.0)
MONO ABS: 0.4 10*3/uL (ref 0.1–1.0)
MPV: 9.3 fL (ref 8.6–12.4)
Monocytes Relative: 8 % (ref 3–12)
Neutro Abs: 2.1 10*3/uL (ref 1.7–7.7)
Neutrophils Relative %: 45 % (ref 43–77)
PLATELETS: 301 10*3/uL (ref 150–400)
RBC: 4.53 MIL/uL (ref 3.87–5.11)
RDW: 16.6 % — ABNORMAL HIGH (ref 11.5–15.5)
WBC: 4.6 10*3/uL (ref 4.0–10.5)

## 2014-06-12 LAB — TSH: TSH: 1.288 u[IU]/mL (ref 0.350–4.500)

## 2014-06-13 LAB — VITAMIN D 25 HYDROXY (VIT D DEFICIENCY, FRACTURES): Vit D, 25-Hydroxy: 34 ng/mL (ref 30–100)

## 2014-06-14 ENCOUNTER — Other Ambulatory Visit: Payer: 59 | Admitting: Internal Medicine

## 2014-06-15 ENCOUNTER — Encounter: Payer: 59 | Admitting: Internal Medicine

## 2014-06-20 ENCOUNTER — Encounter: Payer: Self-pay | Admitting: Internal Medicine

## 2014-06-20 ENCOUNTER — Ambulatory Visit (INDEPENDENT_AMBULATORY_CARE_PROVIDER_SITE_OTHER): Payer: 59 | Admitting: Internal Medicine

## 2014-06-20 ENCOUNTER — Other Ambulatory Visit (HOSPITAL_COMMUNITY)
Admission: RE | Admit: 2014-06-20 | Discharge: 2014-06-20 | Disposition: A | Discharge status: Home / Self Care | Payer: 59 | Source: Ambulatory Visit | Attending: Internal Medicine | Admitting: Internal Medicine

## 2014-06-20 VITALS — BP 116/80 | HR 94 | Temp 98.3°F | Ht 64.0 in | Wt 198.0 lb

## 2014-06-20 DIAGNOSIS — I10 Essential (primary) hypertension: Secondary | ICD-10-CM

## 2014-06-20 DIAGNOSIS — R1310 Dysphagia, unspecified: Secondary | ICD-10-CM | POA: Diagnosis not present

## 2014-06-20 DIAGNOSIS — Z01419 Encounter for gynecological examination (general) (routine) without abnormal findings: Secondary | ICD-10-CM | POA: Insufficient documentation

## 2014-06-20 DIAGNOSIS — K5900 Constipation, unspecified: Secondary | ICD-10-CM

## 2014-06-20 DIAGNOSIS — E119 Type 2 diabetes mellitus without complications: Secondary | ICD-10-CM | POA: Diagnosis not present

## 2014-06-20 DIAGNOSIS — E039 Hypothyroidism, unspecified: Secondary | ICD-10-CM | POA: Diagnosis not present

## 2014-06-20 DIAGNOSIS — F32A Depression, unspecified: Secondary | ICD-10-CM

## 2014-06-20 DIAGNOSIS — Z9884 Bariatric surgery status: Secondary | ICD-10-CM | POA: Diagnosis not present

## 2014-06-20 DIAGNOSIS — Z62810 Personal history of physical and sexual abuse in childhood: Secondary | ICD-10-CM

## 2014-06-20 DIAGNOSIS — Z8709 Personal history of other diseases of the respiratory system: Secondary | ICD-10-CM | POA: Diagnosis not present

## 2014-06-20 DIAGNOSIS — Z0000 Encounter for general adult medical examination without abnormal findings: Secondary | ICD-10-CM | POA: Diagnosis not present

## 2014-06-20 DIAGNOSIS — Z8739 Personal history of other diseases of the musculoskeletal system and connective tissue: Secondary | ICD-10-CM

## 2014-06-20 DIAGNOSIS — N898 Other specified noninflammatory disorders of vagina: Secondary | ICD-10-CM | POA: Diagnosis not present

## 2014-06-20 DIAGNOSIS — F418 Other specified anxiety disorders: Secondary | ICD-10-CM

## 2014-06-20 DIAGNOSIS — F419 Anxiety disorder, unspecified: Secondary | ICD-10-CM

## 2014-06-20 DIAGNOSIS — F329 Major depressive disorder, single episode, unspecified: Secondary | ICD-10-CM

## 2014-06-20 MED ORDER — CYCLOBENZAPRINE HCL 10 MG PO TABS: 10.0000 mg | ORAL_TABLET | Freq: Three times a day (TID) | ORAL | Status: DC | PRN

## 2014-06-20 MED ORDER — ESTROGENS, CONJUGATED 0.625 MG/GM VA CREA: 1.0000 | TOPICAL_CREAM | VAGINAL | Status: DC

## 2014-06-20 MED ORDER — ESTROGENS, CONJUGATED 0.625 MG/GM VA CREA: 1.0000 | TOPICAL_CREAM | Freq: Every day | VAGINAL | Status: DC

## 2014-06-20 MED ORDER — HYDROCODONE-ACETAMINOPHEN 5-325 MG PO TABS: 1.0000 | ORAL_TABLET | Freq: Four times a day (QID) | ORAL | Status: DC | PRN

## 2014-06-20 MED ORDER — FLUTICASONE-SALMETEROL 250-50 MCG/DOSE IN AEPB: 1.0000 | INHALATION_SPRAY | Freq: Two times a day (BID) | RESPIRATORY_TRACT | Status: DC

## 2014-06-20 MED ORDER — ESCITALOPRAM OXALATE 20 MG PO TABS: 20.0000 mg | ORAL_TABLET | Freq: Every day | ORAL | Status: DC
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
## 2014-06-20 NOTE — Patient Instructions (Signed)
Appointment with Dr. Olevia Perches regarding issues with swallowing and significant constipation. Use Premarin vaginal cream 3 times weekly for vaginal dryness. Continue counseling. Continue follow-up with Dr. Tobe Sos.

## 2014-06-21 LAB — MICROALBUMIN / CREATININE URINE RATIO
CREATININE, URINE: 153.2 mg/dL
MICROALB UR: 0.5 mg/dL (ref <2.0)
MICROALB/CREAT RATIO: 3.3 mg/g (ref 0.0–30.0)

## 2014-06-22 LAB — CYTOLOGY - PAP

## 2014-06-26 ENCOUNTER — Encounter: Payer: Self-pay | Admitting: Internal Medicine

## 2014-07-19 ENCOUNTER — Ambulatory Visit: Payer: 59 | Admitting: "Endocrinology

## 2014-07-23 ENCOUNTER — Other Ambulatory Visit: Payer: Self-pay

## 2014-08-01 ENCOUNTER — Telehealth: Payer: Self-pay | Admitting: "Endocrinology

## 2014-08-01 DIAGNOSIS — E559 Vitamin D deficiency, unspecified: Secondary | ICD-10-CM

## 2014-08-01 DIAGNOSIS — E063 Autoimmune thyroiditis: Secondary | ICD-10-CM

## 2014-08-01 NOTE — Telephone Encounter (Signed)
1. I reviewed patient's lab results from 06/12/14. Her 25-OH vitamin D was good, but her calcium had dropped to 8.5. Her TSH was quite good.  2. She needs new orders prior to her upcoming visit. I put in orders for TFTs, calcium, PTH, and 25-OH vitamin D. Sherrlyn Hock

## 2014-08-02 LAB — COMPREHENSIVE METABOLIC PANEL
ALT: 22 U/L (ref 0–35)
AST: 22 U/L (ref 0–37)
Albumin: 4.1 g/dL (ref 3.5–5.2)
Alkaline Phosphatase: 61 U/L (ref 39–117)
BUN: 18 mg/dL (ref 6–23)
CALCIUM: 9.1 mg/dL (ref 8.4–10.5)
CHLORIDE: 103 meq/L (ref 96–112)
CO2: 25 meq/L (ref 19–32)
CREATININE: 0.9 mg/dL (ref 0.50–1.10)
Glucose, Bld: 129 mg/dL — ABNORMAL HIGH (ref 70–99)
Potassium: 4.7 mEq/L (ref 3.5–5.3)
Sodium: 139 mEq/L (ref 135–145)
Total Bilirubin: 0.5 mg/dL (ref 0.2–1.2)
Total Protein: 6.5 g/dL (ref 6.0–8.3)

## 2014-08-02 LAB — TSH: TSH: 2.014 u[IU]/mL (ref 0.350–4.500)

## 2014-08-02 LAB — T4, FREE: FREE T4: 0.87 ng/dL (ref 0.80–1.80)

## 2014-08-02 LAB — T3, FREE: T3, Free: 2.4 pg/mL (ref 2.3–4.2)

## 2014-08-03 LAB — VITAMIN D 25 HYDROXY (VIT D DEFICIENCY, FRACTURES): Vit D, 25-Hydroxy: 35 ng/mL (ref 30–100)

## 2014-08-06 ENCOUNTER — Ambulatory Visit (INDEPENDENT_AMBULATORY_CARE_PROVIDER_SITE_OTHER): Payer: 59 | Admitting: "Endocrinology

## 2014-08-06 ENCOUNTER — Encounter: Payer: Self-pay | Admitting: "Endocrinology

## 2014-08-06 VITALS — BP 117/87 | HR 84 | Wt 205.0 lb

## 2014-08-06 DIAGNOSIS — E1043 Type 1 diabetes mellitus with diabetic autonomic (poly)neuropathy: Secondary | ICD-10-CM

## 2014-08-06 DIAGNOSIS — E1165 Type 2 diabetes mellitus with hyperglycemia: Secondary | ICD-10-CM

## 2014-08-06 DIAGNOSIS — E559 Vitamin D deficiency, unspecified: Secondary | ICD-10-CM

## 2014-08-06 DIAGNOSIS — E049 Nontoxic goiter, unspecified: Secondary | ICD-10-CM

## 2014-08-06 DIAGNOSIS — R000 Tachycardia, unspecified: Secondary | ICD-10-CM

## 2014-08-06 DIAGNOSIS — E038 Other specified hypothyroidism: Secondary | ICD-10-CM

## 2014-08-06 DIAGNOSIS — R1310 Dysphagia, unspecified: Secondary | ICD-10-CM

## 2014-08-06 DIAGNOSIS — I10 Essential (primary) hypertension: Secondary | ICD-10-CM

## 2014-08-06 DIAGNOSIS — I471 Supraventricular tachycardia: Secondary | ICD-10-CM

## 2014-08-06 DIAGNOSIS — IMO0002 Reserved for concepts with insufficient information to code with codable children: Secondary | ICD-10-CM

## 2014-08-06 DIAGNOSIS — E211 Secondary hyperparathyroidism, not elsewhere classified: Secondary | ICD-10-CM

## 2014-08-06 DIAGNOSIS — R5383 Other fatigue: Secondary | ICD-10-CM

## 2014-08-06 DIAGNOSIS — E063 Autoimmune thyroiditis: Secondary | ICD-10-CM

## 2014-08-06 DIAGNOSIS — R4584 Anhedonia: Secondary | ICD-10-CM

## 2014-08-06 LAB — GLUCOSE, POCT (MANUAL RESULT ENTRY): POC Glucose: 158 mg/dl — AB (ref 70–99)

## 2014-08-06 LAB — PTH, INTACT AND CALCIUM
CALCIUM: 9.1 mg/dL (ref 8.4–10.5)
PTH: 79 pg/mL — ABNORMAL HIGH (ref 14–64)

## 2014-08-06 LAB — POCT GLYCOSYLATED HEMOGLOBIN (HGB A1C): Hemoglobin A1C: 7.4

## 2014-08-06 NOTE — Progress Notes (Signed)
Subjective:  Patient Name: Alexa [name] Date of Birth: [birth year]  MRN: [id number]  Alexa [name]  presents to the office today for follow-up of her type 2 diabetes mellitus, obesity, combined hyperlipidemia, GERD, hypertension, ASHD, dyspepsia, pedal edema, non-proliferative diabetic retinopathy, goiter, depression, autonomic neuropathy, tachycardia, peripheral neuropathy, vitamin D deficiency, hypocalcemia, secondary hyperparathyroidism, glossitis, pallor, fatigue, iron deficiency anemia, anhedonia, disinclination to take medicines, and status post Roux-en-Y gastric bypass.   HISTORY OF PRESENT ILLNESS:   [name] is a 54 y.o. Caucasian woman. Alexa [name] was unaccompanied.  1. The patient first presented to me on 08/20/04 in referral from her primary care internist, Dr. Emeline General, for evaluation and management of type 2 diabetes, obesity, and multiple medical issues. She was 54 years old.   [name] had a long history of obesity. She was heavy as a child. She underwent menarche at age 56. At age 16 she was 180 pounds. In 1996 she weighed 218 pounds. In 1999 she was diagnosed with type 2 diabetes mellitus. Her weight at that point was 250 pounds. She was treated with Glucophage, and Actos. Actos made her gain even more weight. She had also been treated with glipizide in the past. More recently she had been treated with Lantus and Glucophage plus regular insulin as needed for when she took steroids for asthma attacks. Maximum weight had been 296 pounds one month prior to that first visit with me. Her tendency to gain weight and her difficulty in losing weight were aggravated by long-standing, intermittent depression and by severe, recurrent asthma requiring the use of steroid medications.  B. Past medical history was also positive for a 60% blockage in one of her coronary arteries. She had significant issues with GERD and dyspepsia. She also had combined hyperlipidemia. She had a previous cardiac  catheterization and previous tonsillectomy. She was allergic to theophylline. Her pertinent review of systems was positive for some numbness and tingling in her feet. Family history was positive for type 2 diabetes in her father and her maternal grandmother. Her brother weighed 250 pounds at a height of 76 inches. Both her mother and her sister were hypothyroid.  C. On physical examination, her weight was 279.9 pounds. Her BMI was 49.6. Her blood pressure was 142/86. Her heart rate was 96. Her hemoglobin A1c was 9.8%. She was alert and oriented x3. Her affect was normal. Her insight was fair. She was obviously quite obese. She had a 25 gm thyroid gland. She had 1+ tremor of her hands. She had 1+ DP pulses and 2+ tinea pedis. Sensation to touch was intact in her feet. Laboratory data included a normal CMP. Her cholesterol was 175, triglycerides 76, HDL 53, and LDL 107. Her TSH was 2.98. Since she obviously did have type 2 diabetes mellitus associated with morbid obesity, and since weight loss was a major factor for her, I asked her to resume her metformin twice daily. I also started her on Byetta, initially 5 mcg twice daily and then 10 mcg twice daily. I discontinued her Lantus insulin.  2. During the last 10 years,we have had some major successes and some new problem areas.  A. T2DM: In October 2007 the combination of Byetta and metformin was causing more gastrointestinal problems. Patient opted to stop the metformin and continue the Byetta because it was helping her with weight control and blood sugar control. By 08/11/06 her weight had decreased to 267.6 pounds. Hemoglobin A1c was 8.6%. At that point she decided to have bariatric  surgery. She had a Roux-en-Y gastric bypass on 12/20/2006. She subsequently lost weight down to 173.4 pounds on 05/23/08, but then subsequently regained weight to the 190s. Her  hemoglobin A1c values varied in parallel with her weight. On 05/23/08, at the point of her lowest weight, her  hemoglobin A1c dropped to a nadir of 6.3%.  Since then her hemoglobin A1c values have varied between 6.6 and 10.3%. Although we were initially able to stop all of her diabetes medicines after bariatric surgery, when she began to regain weight we restarted metformin, 500 mg twice daily. Although she took her metformin twice daily for a long time, she had discontinued it many months ago. She hated to take medicines and could not make herself be compliant in taking medications or exercising. When her HbA1c increased to 9.4% in September 2015, I stopped her Byetta injections and started her on liraglutide (Victoza) daily injections on 11/16/13.  B. Vitamin D deficiency, hypocalcemia, and secondary hyperparathyroidism: Just before her gastric bypass, I obtained baseline bone mineral metabolism studies. Her 25-hydroxy vitamin D was very low at 7 (normal greater than or equal to 30). Her calcium was 9.5 (normal 8.6-10.6). Her parathyroid hormone was 38.4 (normal 14-72). Subsequent to her surgery, the patient was supposed to be taking multivitamins with calcium and vitamin D, but did not always do so. On 03/29/2007 her 25-hydroxy vitamin D was 29, but her calcium decreased to 8.7. Her PTH was slightly elevated at 73.5. Her 1,25-hydroxy vitamin D was 46. Her iron was 56. I asked her to make sure she took her multivitamins and calcium daily. Unfortunately, she has often non-compliant with her multivitamins and calcium. Her 25-hydroxy vitamin D values have varied between 19-27. Her calcium values have varied between 8.1-9.4. Her PTH values have remained elevated between 82.9-167. The patient's iron levels have also been low, between 32-34. She is supposed to be taking iron every day as well. Unfortunately, in August of 2014 she discontinued all vitamins and supplements.   C. Fortunately, her recent outpatient psychological therapy with Ms. Rea College, RN, MS has resulted in [place] having a much more positive attitude  about life in general and a more positive attitude about taking her medications.  3. The patient's last [facility] visit was on 05/15/14. In the interim her asthma has not been acting up. She feels that her sessions with Ms. Cathy Showfety have been very beneficial and enlightening. Aracelie is still "working on some stuff" in terms of activities and how to spend her time.  She has joy in her life and is trying to seek out opportunities for even more joy. She has been taking all of her medications, to include Lexapro, metformin twice daily, ferrous fumarate once daily, Micardis once daily, Synthroid 25 mcg/day, and Citracal-D (315 mg of calcium and 250 IU of vitamin D per tablet) two, twice daily. She remains on her daily Victoza dose of 1.8 mg and feels good. She also takes tryptophan and Flexeril as needed.  She is still tired due to working too many hours at work and at home.    A. She and her husband did not join the [employer] due to their busy schedules.   B. Hot flashes are "not too bad", but she also gets night sweats at times.    C. During her husband's 3-week trip to [place] she ate out more frequently.   D. She is tired today. She had to pick up her husband at the airport in [place] at  about 1 AM on Sunday morning, and has not slept much since then.   4. Pertinent Review of Systems:  Constitutional: The patient feels "tired" after working for the past 5 days.   Eyes: Vision is good. There are no significant eye complaints. She had an eye exam in April or May 2014. That exam showed no signs of diabetic retinopathy. She needs to schedule a follow up exam.  Neck: She has had more swallowing problems recently. She has an appointment to see Dr. Brodie in August. She has no complaints of anterior neck swelling, soreness, tenderness,  pressure, or discomfort.  Heart:  Heart rate increases with exercise or other physical activity. She has no complaints of palpitations, irregular heat beats, other  chest pain, or chest pressure. Gastrointestinal: She does not notice much reflux or acid indigestion. Abdomen has not been tender to palpation. Bowel movents seem normal. The patient has no complaints of excessive hunger, acid reflux, upset stomach, stomach aches or pains, diarrhea, or constipation. Legs: She still occasionally gets bad leg cramps, but "not like I used to". Muscle mass and strength seem normal. There are no other complaints of numbness, tingling, burning, or pain. She occasionally has mild edema. Feet: There are no obvious foot problems. Tinea improves when she uses the anti-fungal cream. There are no complaints of numbness, tingling, burning, or pain. No edema is noted. Psychological: She is doing much better, but is a little anxious about some upcoming commitments. She has more joyful activities in her life, but probably still not enough.  Hypoglycemia: None  4. BG printout: She is not checking her BGs.   PAST MEDICAL, FAMILY, AND SOCIAL HISTORY:  Past Medical History  Diagnosis Date  . Diabetes mellitus   . Asthma   . Hypertension   . GERD (gastroesophageal reflux disease)   . CAD (coronary artery disease)   . Elevated homocysteine   . Chronic insomnia   . Diabetes mellitus type II   . Obesity, Class III, BMI 40-49.9 (morbid obesity)   . Combined hyperlipidemia   . Asthma, chronic   . GERD (gastroesophageal reflux disease)   . Dyspepsia   . Nonproliferative diabetic retinopathy associated with type 2 diabetes mellitus     RESOLVED  . Goiter   . Depression   . Diabetic autonomic neuropathy   . DM type 2 with diabetic peripheral neuropathy   . Vitamin D deficiency   . Hyperparathyroidism , secondary, non-renal   . Tachycardia   . Glossitis   . Thyroiditis, autoimmune   . Fatigue   . Pallor   . Anemia, iron deficiency   . Essential tremor     Family History  Problem Relation Age of Onset  . Thyroid disease Mother     Hypothyroid  . Diabetes Father      T2 DM  . Obesity Brother     25 0 pounds at a height of 76 inches.  . Thyroid disease Sister     Hypothyroid  . Diabetes Maternal Grandmother      Current outpatient prescriptions:  .  albuterol (PROVENTIL HFA;VENTOLIN HFA) 108 (90 BASE) MCG/ACT inhaler, Inhale 2 puffs into the lungs every 6 (six) hours as needed for wheezing or shortness of breath., Disp: 1 Inhaler, Rfl: 2 .  Calcium Carbonate-Vitamin D (CALCIUM-VITAMIN D) 500-200 MG-UNIT per tablet, Take 1 tablet by mouth 2 (two) times daily with a meal.  , Disp: , Rfl:  .  conjugated estrogens (PREMARIN) vaginal cream, Place 1 Applicatorful vaginally every  3 (three) days. Place i applicatorful vaginally q 3 days, Disp: 42.5 g, Rfl: 12 .  cyclobenzaprine (FLEXERIL) 10 MG tablet, Take 1 tablet (10 mg total) by mouth 3 (three) times daily as needed for muscle spasms., Disp: 270 tablet, Rfl: 1 .  escitalopram (LEXAPRO) 20 MG tablet, Take 1 tablet (20 mg total) by mouth daily., Disp: 90 tablet, Rfl: 0 .  ferrous fumarate (HEMOCYTE - 106 MG FE) 325 (106 FE) MG TABS tablet, Take one tablet daily., Disp: 90 each, Rfl: 3 .  Fluticasone-Salmeterol (ADVAIR DISKUS) 250-50 MCG/DOSE AEPB, Inhale 1 puff into the lungs 2 (two) times daily., Disp: 1 each, Rfl: 5 .  glucose blood (ACCU-CHEK SMARTVIEW) test strip, Check sugar 6 x daily, Disp: 600 each, Rfl: 4 .  HYDROcodone-acetaminophen (NORCO/VICODIN) 5-325 MG per tablet, Take 1 tablet by mouth every 6 (six) hours as needed for moderate pain., Disp: 60 tablet, Rfl: 0 .  Liraglutide (VICTOZA) 18 MG/3ML SOPN, Inject 1.8 mg daily, Disp: 10 pen, Rfl: 4 .  metFORMIN (GLUCOPHAGE) 500 MG tablet, Take one tablet at breakfast and one at dinner., Disp: 180 tablet, Rfl: 3 .  Multiple Vitamin (MULTIVITAMIN) tablet, Take 1 tablet by mouth daily.  , Disp: , Rfl:  .  SYNTHROID 25 MCG tablet, Take one brand Synthroid tablet daily at breakfast., Disp: 90 tablet, Rfl: 3 .  telmisartan (MICARDIS) 40 MG tablet, Take one tablet  each morning., Disp: 90 tablet, Rfl: 3 .  TRYPTOPHAN PO, Take by mouth. Take 4 tablets ay bedtime, Disp: , Rfl:  .  esomeprazole (NEXIUM) 40 MG capsule, Take 1 capsule (40 mg total) by mouth daily before breakfast. (Patient not taking: Reported on 08/06/2014), Disp: 90 capsule, Rfl: 3 .  ondansetron (ZOFRAN) 8 MG tablet, Take 1 tablet (8 mg total) by mouth every 8 (eight) hours as needed for nausea or vomiting. (Patient not taking: Reported on 08/06/2014), Disp: 30 tablet, Rfl: 1 .  promethazine (PHENERGAN) 25 MG tablet, Take 1-2 tablets twice daily for nausea (Patient not taking: Reported on 02/22/2014), Disp: 60 tablet, Rfl: 3  Allergies as of 08/06/2014 - Review Complete 08/06/2014  Allergen Reaction Noted  . Theophyllines Palpitations 05/13/2010    1. 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 2. Activities: She has not been swimming in long time. She will schedule some vacation time this Fall.   3. Smoking, alcohol, or drugs: None 4. Primary Care Provider: Dr. Tommie Ard Baxley 5. Therapist: Ms. Rea College, MS  REVIEW OF SYSTEM: There are no other significant problems involving [place]'s other body systems.   Objective:  Vital Signs:  BP 117/87 mmHg  Pulse 84  Wt 205 lb (92.987 kg)  LMP 05/11/2010   Ht Readings from Last 3 Encounters:  06/20/14 5\' 4"  (1.626 m)  10/10/13 5\' 4"  (1.626 m)  06/12/13 5\' 4"  (3.474 m)   Wt Readings from Last 3 Encounters:  08/06/14 205 lb (92.987 kg)  06/20/14 198 lb (89.812 kg)  05/15/14 197 lb (89.359 kg)   PHYSICAL EXAM:  Constitutional: She has gained 8 lbs since last visit. She looks tired today, in part due to not having much sleep in the past two days. She is fairly upbeat and positive, but is also anxious about having over-committed herself again. She is not depressed, but is also not as bright and bubbly as at the last  visit. Eyes:  There is no arcus or proptosis.  Mouth: The oropharynx appears normal. The tongue appears normal. There is normal oral moisture. There is no obvious gingivitis. Neck: There are no bruits present. The thyroid gland appears normal in size. The thyroid gland is larger today at about 20+ grams in size. The right lobe is within normal limits. The left ;lobe is mildly enlarged. The consistency of the thyroid gland is normal. There is no thyroid tenderness to palpation. Lungs: The lungs are clear. Air movement is good. Heart: The heart rhythm and rate appear normal. Heart sounds S1 and S2 are normal. I do not appreciate any pathologic heart murmurs. Abdomen: The abdomen is enlarged. Bowel sounds are normal. The abdomen is soft and non-tender. There is no obviously palpable hepatomegaly, splenomegaly, or other masses.  Arms: Muscle mass appears appropriate for age.  Hands: There is 2+ tremor. Phalangeal and metacarpophalangeal joints appear normal. Palms are normal. Legs: Muscle mass appears appropriate for age. There is no edema.  Feet: There are no significant deformities. Dorsalis pedis pulses are normal 1+ bilaterally. She has trace tinea of her soles. Neurologic: Muscle strength is normal for age and gender  in both the upper and the lower extremities. Muscle tone appears normal. Sensation to touch is normal in the legs and feet.   LAB DATA:   Labs 08/06/14: HbA1c 7.4%  Labs 08/01/14: TSH 2.014, free T4 0.87, free T3 2.4; PTH 79 (normal 14-64), calcium 9.1; 25-OH vitamin D 35 CMP normal  Labs 05/11/14: HbA1c was 7.7%, without any low BGs; TSH 2.416, free T4 1.13, free T3 2.3  Labs 02/22/14: HbA1c 11.6%  Labs 02/13/14: Calcium 9.0, PTH 131, 25-OH vitamin D 15; CMP normal except for glucose of 165; cholesterol 177, triglycerides 108, HDL 90, LDL 65; urinary microalbumin/creatinine ratio 15.6; TSH 3.639, free T4 1.30, free T3 2.5  Labs 10/10/13: HbA1c is 9.4%, compared with 8.1% at last  visit and with 9.3% in December;   Labs 05/08/13: HbA1c 8.1%; PTH 90.2, calcium 9.5, 25-hydroxy vitamin D 46; CMP normal except for glucose of 215; WBC 8.0, RBC 5.20, Hgb 12.5, Hct 37.8%, MCV 72.7 (normal 78-100), iron 55  Labs 02/23/13: CMP normal, except glucose 194 and alkaline phosphatase 126; microalbumin/creatinine ratio was 11.5; TSH 2.086, free T4 1.29, free T3 3.2, TPO antibody < 10; 25-hydroxy vitamin D 18; C-peptide 1.79; cholesterol 156, triglycerides 64, HDL 61, LDL 82  Labs 01/05/13: Hemoglobin A1c was 9.3%, compared with 9.3 at last visit and with 9.1% at the visit prior. All of these values were major increases from 6.6% in May 2012.    Labs 04/14/12: 25-hydroxy vitamin D 21, iron 203,  Vitamin B12 379,   Labs 11/16/11: Hgb 10.3, Hct 32.4%; CMP normal except glucose 146' cholesterol 201, triglycerides 70, HDL 66, LDL 121; vitamin D 22           Labs 06/09/10: Cholesterol was 175, triglycerides 76, HDL 62, LDL 98. TSH was 1.217. Free T4 was 1.10. Free T3 was 2.7. 25-hydroxy vitamin D was 19. PTH was 167.   Assessment and Plan:   ASSESSMENT:  1. Type 2 diabetes mellitus: BG control has continued to improve. She is tolerating Victoza and metformin well. She had been trying to be more carefull with her diet, but slipped up some during her husband's absence.  2. Hypoglycemia: No subjective complaints  3. Obesity: She has gained 8 pounds since last visit, equivalent to an extra 300 calories per day. She has been drinking more coffee  and using cream and sugar. Her Victoza is helping her with weight loss. She has not yet resumed exercising. 4-5. Autonomic neuropathy and tachycardia: The patient's autonomic neuropathy and  heart rate have improved. .  6-8. Goiter/thyroiditis, hypothyroid:   A. The thyroid gland is slightly larger today, c/w more inflammation due to Hashimoto's Dz activity.   B. The patient was euthyroid in May 2012 and January 2015, hypothyroid in January 2016, but  euthyroid in April and July of this year on her Synthroid dose of 25 mcg/day. .  9-11.  Vitamin D deficiency, hypocalcemia, and secondary hyperparathyroidism:   A. Her vitamin D level was within normal, but at the lower end of the normal range. Her PTH is elevated. Her calcium is just above the 25% of the normal range. She is again mildly hyperparathyroid due to deficient intake of calcium and vitamin D.   We need to increase her intakes of calcium and vitamin D.  12. Hypertension: Her SBP is better today, but her DBP is worse since reducing her physical activity and gaining weight.   13. Fatigue, poor sleep, hot flashes, memory problems, attention problems: These problems are better since resuming her medications. .  14. Anhedonia and anxiety: She is doing fairly well today. The therapy sessions with Ms. Showfety have worked wonders. 15. Difficulty swallowing/gastroparesis: She will have GI evaluation next month.   PLAN:  1. Diagnostic: HbA1c at next visit. Repeat her 25-OH vitamin D, calcium, and PTH prior to next visit. 2. Therapeutic: Continue medications as currently prescribed. Increase the Citracal D to 3 tablets, twice daily. Order Biotek, 50,000 IU per week. Try to fit in exercise daily.   3. Patient education: She must take her medications and supplements. 4. Follow-up: 2 months  Level of Service: This visit lasted in excess of 60 minutes. More than 50% of the visit was devoted to counseling.   Sherrlyn Hock, MD 08/06/2014 2:31 PM

## 2014-08-06 NOTE — Patient Instructions (Signed)
Follow up visit in two months. Please repeat lab tests one week prior.

## 2014-08-16 ENCOUNTER — Encounter: Payer: Self-pay | Admitting: Internal Medicine

## 2014-08-16 ENCOUNTER — Ambulatory Visit (INDEPENDENT_AMBULATORY_CARE_PROVIDER_SITE_OTHER): Payer: 59 | Admitting: Internal Medicine

## 2014-08-16 VITALS — BP 110/72 | HR 111 | Temp 100.5°F | Wt 205.0 lb

## 2014-08-16 DIAGNOSIS — H6502 Acute serous otitis media, left ear: Secondary | ICD-10-CM

## 2014-08-16 DIAGNOSIS — M5441 Lumbago with sciatica, right side: Secondary | ICD-10-CM

## 2014-08-16 DIAGNOSIS — J069 Acute upper respiratory infection, unspecified: Secondary | ICD-10-CM | POA: Diagnosis not present

## 2014-08-16 DIAGNOSIS — B349 Viral infection, unspecified: Secondary | ICD-10-CM | POA: Diagnosis not present

## 2014-08-16 MED ORDER — LEVOFLOXACIN 500 MG PO TABS: 500.0000 mg | ORAL_TABLET | Freq: Every day | ORAL | Status: DC

## 2014-08-16 MED ORDER — PREDNISONE 10 MG PO TABS: ORAL_TABLET | ORAL | Status: DC

## 2014-08-16 MED ORDER — HYDROCODONE-HOMATROPINE 5-1.5 MG/5ML PO SYRP
5.0000 mL | ORAL_SOLUTION | Freq: Three times a day (TID) | ORAL | Status: DC | PRN
Start: 2014-08-16 — End: 2015-07-08

## 2014-08-16 NOTE — Progress Notes (Signed)
   Subjective:    Patient ID: Alexa [name], female    DOB: 1960-08-20, 54 y.o.   MRN: [id number]  HPI In today with URI symptoms cough and congestion for the past 2-3 days. Says temp was up to 103 earlier this morning. Has had some chills. No shortness of breath or wheezing. Has cough and congestion with productive cough.  Also his been having issues with back pain for the past 3 weeks and is having difficulty ambulating.    Review of Systems     Objective:   Physical Exam  Straight leg raising is positive at 90 on the right negative on the left. Muscle strength in the lower extremities is normal. Deep tendon reflexes 2+ and symmetrical in the lower extremities.  Left TM is full but not red. Right TM is clear. Pharynx is only very slightly injected neck is supple. Chest is clear to auscultation without rales or wheezing      Assessment & Plan:  Acute bronchitis  Acute left serous otitis media  Low back pain-recurrent on the right. Has hydrocodone if needed for back pain. Prednisone should help some. Order given for physical therapy.  Plan: Sterapred DS 10 mg 12 day dosepak for back pain. Hycodan 1 teaspoon by mouth every 8 hours when necessary cough. Levaquin 500 milligrams daily for 10 days. Note to be out of work and return on Monday, July 25. Order given for physical therapy for back pain

## 2014-08-16 NOTE — Patient Instructions (Signed)
Take prednisone for 12 days and tapering course as directed. Take Levaquin daily for 10 days. Note given for physical therapy. Take Hycodan as needed for cough.

## 2014-09-04 ENCOUNTER — Encounter: Payer: Self-pay | Admitting: Internal Medicine

## 2014-09-04 ENCOUNTER — Ambulatory Visit (INDEPENDENT_AMBULATORY_CARE_PROVIDER_SITE_OTHER): Payer: 59 | Admitting: Internal Medicine

## 2014-09-04 VITALS — BP 120/80 | HR 80 | Ht 64.0 in | Wt 202.2 lb

## 2014-09-04 DIAGNOSIS — R1310 Dysphagia, unspecified: Secondary | ICD-10-CM | POA: Diagnosis not present

## 2014-09-04 DIAGNOSIS — K5902 Outlet dysfunction constipation: Secondary | ICD-10-CM | POA: Diagnosis not present

## 2014-09-04 MED ORDER — POLYETHYLENE GLYCOL 3350 17 GM/SCOOP PO POWD: 17.0000 g | Freq: Every day | ORAL | Status: DC

## 2014-09-04 MED ORDER — PANTOPRAZOLE SODIUM 40 MG PO TBEC: 40.0000 mg | DELAYED_RELEASE_TABLET | Freq: Every day | ORAL | Status: DC

## 2014-09-04 NOTE — Progress Notes (Signed)
Alexa [name] 17-Nov-1960 [id number]  Note: This dictation was prepared with Dragon digital system. Any transcriptional errors that result from this procedure are unintentional.   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 She underwent Roux-en-Y gastric bypass by Dr. Lucia Gaskins in 2008 and has lost over 100 pounds. About a year ago she noticed solid food dysphagia especially to meats and bread. She also has pill dysphagia. She denies any dysphagia to liquids. She was found to be hypothyroid and she attributed  dysphagia to metabolic problems. She denies heartburn. She was started on Nexium but does not take it. She is also iron deficient because of decreased absorption. She has been on iron supplements  Separate problem has been constipation. She has a bowel movement every 3 days. She has to apply manual pressure to evacuate stool.    Past Medical History  Diagnosis Date  . Diabetes mellitus   . Asthma   . Hypertension   . GERD (gastroesophageal reflux disease)   . CAD (coronary artery disease)   . Elevated homocysteine   . Chronic insomnia   . Diabetes mellitus type II   . Obesity, Class III, BMI 40-49.9 (morbid obesity)   . Combined hyperlipidemia   . Asthma, chronic   . GERD (gastroesophageal reflux disease)   . Dyspepsia   . Nonproliferative diabetic retinopathy associated with type 2 diabetes mellitus     RESOLVED  . Goiter   . Depression   . Diabetic autonomic neuropathy   . DM type 2 with diabetic peripheral neuropathy   . Vitamin D deficiency   . Hyperparathyroidism , secondary, non-renal   . Tachycardia   . Glossitis   . Thyroiditis, autoimmune   . Fatigue   . Pallor   . Anemia, iron deficiency   . Essential tremor     Past Surgical History  Procedure Laterality Date  . Roux-en-y procedure    . Tonsillectomy    . Cardiac catheterization      x 2    Allergies  Allergen Reactions  . Theophyllines Palpitations    Family history and social history have been reviewed.  Review of Systems: Constipation. Denies abdominal pain nausea vomiting  The remainder of the 10 point ROS is negative except as outlined in the H&P  Physical Exam: General Appearance Well developed, in no distress, overweight Eyes  Non icteric  HEENT  Non traumatic, normocephalic  Mouth No lesion, tongue papillated, no cheilosis Neck Supple without adenopathy, thyroid not enlarged, no carotid bruits, no JVD Lungs Clear to auscultation bilaterally COR Normal S1, normal S2, regular rhythm, no murmur, quiet precordium Abdomen soft minimally tender in epigastrium. Normoactive bowel sounds. Laparoscopic scars. Lower abdomen unremarkable Rectal large amount of impacted hard stool which is Hemoccult-negative Extremities  No pedal edema Skin No lesions Neurological Alert and oriented x 3 Psychological Normal mood and affect  Assessment and Plan:   Gastroesophageal reflux and esophageal stricture. Causing solid food dysphagia. She will resume Protonix 40 mg a day and antireflux measures. We will schedule barium esophagram and will probably need upper endoscopy with dilatation  Functional constipation. May be due to decreased food intake and activity. She needs to drink more liquids. She will start MiraLAX 17 g daily which she may increase to as needed .Marland Kitchen Some of her symptoms such as manual pressure are consistent with pelvic floor relaxation. I encouraged her to be  physically active and do Cagle exercises to strengthen her pelvic floor muscles.  Colorectal screening. Last colonoscopy in September 2014 was  compromised by poor prep. She had external hemorrhoids. Recall colonoscopy in 5 years will be in September 2019  Fatty liver by history    Delfin Edis 09/04/2014

## 2014-09-04 NOTE — Patient Instructions (Addendum)
We have sent the following medications to your pharmacy for you to pick up at your convenience: Miralax and Protonix   You have been scheduled for a Barium Esophogram at [facility]  on 09/07/14 at 9:00AM. Please arrive 15 minutes prior to your appointment for registration. Make certain not to have anything to eat or drink 3 hours prior to your test. If you need to reschedule for any reason, please contact radiology at [phone] to do so. __________________________________________________________________ A barium swallow is an examination that concentrates on views of the esophagus. This tends to be a double contrast exam (barium and two liquids which, when combined, create a gas to distend the wall of the oesophagus) or single contrast (non-ionic iodine based). The study is usually tailored to your symptoms so a good history is essential. Attention is paid during the study to the form, structure and configuration of the esophagus, looking for functional disorders (such as aspiration, dysphagia, achalasia, motility and reflux) EXAMINATION You may be asked to change into a gown, depending on the type of swallow being performed. A radiologist and radiographer will perform the procedure. The radiologist will advise you of the type of contrast selected for your procedure and direct you during the exam. You will be asked to stand, sit or lie in several different positions and to hold a small amount of fluid in your mouth before being asked to swallow while the imaging is performed .In some instances you may be asked to swallow barium coated marshmallows to assess the motility of a solid food bolus. The exam can be recorded as a digital or video fluoroscopy procedure. POST PROCEDURE It will take 1-2 days for the barium to pass through your system. To facilitate this, it is important, unless otherwise directed, to increase your fluids for the next 24-48hrs and to resume your normal diet.  This test  typically takes about 30 minutes to perform. __________________________________________________________________________________   I appreciate the opportunity to care for you.  Dr Renold Genta

## 2014-09-07 ENCOUNTER — Ambulatory Visit (HOSPITAL_COMMUNITY)
Admission: RE | Admit: 2014-09-07 | Discharge: 2014-09-07 | Disposition: A | Discharge status: Home / Self Care | Payer: 59 | Source: Ambulatory Visit | Attending: Internal Medicine | Admitting: Internal Medicine

## 2014-09-07 DIAGNOSIS — R1310 Dysphagia, unspecified: Secondary | ICD-10-CM | POA: Diagnosis present

## 2014-09-07 DIAGNOSIS — K449 Diaphragmatic hernia without obstruction or gangrene: Secondary | ICD-10-CM | POA: Diagnosis not present

## 2014-09-07 DIAGNOSIS — K219 Gastro-esophageal reflux disease without esophagitis: Secondary | ICD-10-CM | POA: Insufficient documentation

## 2014-10-08 ENCOUNTER — Ambulatory Visit (INDEPENDENT_AMBULATORY_CARE_PROVIDER_SITE_OTHER): Payer: 59 | Admitting: "Endocrinology

## 2014-10-08 ENCOUNTER — Ambulatory Visit
Admission: RE | Admit: 2014-10-08 | Discharge: 2014-10-08 | Disposition: A | Discharge status: Home / Self Care | Payer: 59 | Source: Ambulatory Visit | Attending: Internal Medicine | Admitting: Internal Medicine

## 2014-10-08 ENCOUNTER — Encounter: Payer: Self-pay | Admitting: "Endocrinology

## 2014-10-08 VITALS — BP 155/104 | HR 83 | Wt 202.8 lb

## 2014-10-08 DIAGNOSIS — E669 Obesity, unspecified: Secondary | ICD-10-CM

## 2014-10-08 DIAGNOSIS — E11649 Type 2 diabetes mellitus with hypoglycemia without coma: Secondary | ICD-10-CM | POA: Diagnosis not present

## 2014-10-08 DIAGNOSIS — IMO0002 Reserved for concepts with insufficient information to code with codable children: Secondary | ICD-10-CM

## 2014-10-08 DIAGNOSIS — Z23 Encounter for immunization: Secondary | ICD-10-CM

## 2014-10-08 DIAGNOSIS — K2100 Gastro-esophageal reflux disease with esophagitis, without bleeding: Secondary | ICD-10-CM

## 2014-10-08 DIAGNOSIS — Z0000 Encounter for general adult medical examination without abnormal findings: Secondary | ICD-10-CM

## 2014-10-08 DIAGNOSIS — R4584 Anhedonia: Secondary | ICD-10-CM

## 2014-10-08 DIAGNOSIS — E1110 Type 2 diabetes mellitus with ketoacidosis without coma: Secondary | ICD-10-CM

## 2014-10-08 DIAGNOSIS — E1165 Type 2 diabetes mellitus with hyperglycemia: Secondary | ICD-10-CM | POA: Diagnosis not present

## 2014-10-08 DIAGNOSIS — E1310 Other specified diabetes mellitus with ketoacidosis without coma: Secondary | ICD-10-CM

## 2014-10-08 DIAGNOSIS — I10 Essential (primary) hypertension: Secondary | ICD-10-CM

## 2014-10-08 LAB — GLUCOSE, POCT (MANUAL RESULT ENTRY): POC Glucose: 162 mg/dl — AB (ref 70–99)

## 2014-10-08 LAB — POCT GLYCOSYLATED HEMOGLOBIN (HGB A1C): Hemoglobin A1C: 8

## 2014-10-08 MED ORDER — VICTOZA 18 MG/3ML ~~LOC~~ SOPN: PEN_INJECTOR | SUBCUTANEOUS | Status: DC

## 2014-10-08 NOTE — Progress Notes (Signed)
Subjective:  Patient Name: Alexa [name] Date of Birth: [birth year]  MRN: [id number]  Alexa [name]  presents to the office today for follow-up of her type 2 diabetes mellitus, obesity, combined hyperlipidemia, GERD, hypertension, ASHD, dyspepsia, pedal edema, non-proliferative diabetic retinopathy, goiter, depression, autonomic neuropathy, tachycardia, peripheral neuropathy, vitamin D deficiency, hypocalcemia, secondary hyperparathyroidism, glossitis, pallor, fatigue, iron deficiency anemia, anhedonia, disinclination to take medicines, and status post Roux-en-Y gastric bypass.   HISTORY OF PRESENT ILLNESS:   Alexa [name] is a 54 y.o. Caucasian woman. Alexa [name] was unaccompanied.  1. The patient first presented to me on 08/20/04 in referral from her primary care internist, Dr. Emeline General, for evaluation and management of type 2 diabetes, obesity, and multiple medical issues. She was 54 years old.   [name] had a long history of obesity. She was heavy as a child. She underwent menarche at age 56. At age 16 she was 180 pounds. In 1996 she weighed 218 pounds. In 1999 she was diagnosed with type 2 diabetes mellitus. Her weight at that point was 250 pounds. She was treated with Glucophage, and Actos. Actos made her gain even more weight. She had also been treated with glipizide in the past. More recently she had been treated with Lantus and Glucophage plus regular insulin as needed for when she took steroids for asthma attacks. Maximum weight had been 296 pounds one month prior to that first visit with me. Her tendency to gain weight and her difficulty in losing weight were aggravated by long-standing, intermittent depression and by severe, recurrent asthma requiring the use of steroid medications.  B. Past medical history was also positive for a 60% blockage in one of her coronary arteries. She had significant issues with GERD and dyspepsia. She also had combined hyperlipidemia. She had a previous cardiac  catheterization and previous tonsillectomy. She was allergic to theophylline. Her pertinent review of systems was positive for some numbness and tingling in her feet. Family history was positive for type 2 diabetes in her father and her maternal grandmother. Her brother weighed 250 pounds at a height of 76 inches. Both her mother and her sister were hypothyroid.  C. On physical examination, her weight was 279.9 pounds. Her BMI was 49.6. Her blood pressure was 142/86. Her heart rate was 96. Her hemoglobin A1c was 9.8%. She was alert and oriented x3. Her affect was normal. Her insight was fair. She was obviously quite obese. She had a 25 gm thyroid gland. She had 1+ tremor of her hands. She had 1+ DP pulses and 2+ tinea pedis. Sensation to touch was intact in her feet. Laboratory data included a normal CMP. Her cholesterol was 175, triglycerides 76, HDL 53, and LDL 107. Her TSH was 2.98. Since she obviously did have type 2 diabetes mellitus associated with morbid obesity, and since weight loss was a major factor for her, I asked her to resume her metformin twice daily. I also started her on Byetta, initially 5 mcg twice daily and then 10 mcg twice daily. I discontinued her Lantus insulin.  2. During the last 10 years,we have had some major successes and some new problem areas.  A. T2DM: In October 2007 the combination of Byetta and metformin was causing more gastrointestinal problems. Patient opted to stop the metformin and continue the Byetta because it was helping her with weight control and blood sugar control. By 08/11/06 her weight had decreased to 267.6 pounds. Hemoglobin A1c was 8.6%. At that point she decided to have bariatric  surgery. She had a Roux-en-Y gastric bypass on 12/20/2006. She subsequently lost weight down to 173.4 pounds on 05/23/08, but then subsequently regained weight to the 190s. Her  hemoglobin A1c values varied in parallel with her weight. On 05/23/08, at the point of her lowest weight, her  hemoglobin A1c dropped to a nadir of 6.3%.  Since then her hemoglobin A1c values have varied between 6.6 and 10.3%. Although we were initially able to stop all of her diabetes medicines after bariatric surgery, when she began to regain weight we restarted metformin, 500 mg twice daily. Although she took her metformin twice daily for a long time, she had discontinued it many months ago. She hated to take medicines and could not make herself be compliant in taking medications or exercising. When her HbA1c increased to 9.4% in September 2015, I stopped her Byetta injections and started her on liraglutide (Victoza) daily injections on 11/16/13.  B. Vitamin D deficiency, hypocalcemia, and secondary hyperparathyroidism: Just before her gastric bypass, I obtained baseline bone mineral metabolism studies. Her 25-hydroxy vitamin D was very low at 7 (normal greater than or equal to 30). Her calcium was 9.5 (normal 8.6-10.6). Her parathyroid hormone was 38.4 (normal 14-72). Subsequent to her surgery, the patient was supposed to be taking multivitamins with calcium and vitamin D, but did not always do so. On 03/29/2007 her 25-hydroxy vitamin D was 29, but her calcium decreased to 8.7. Her PTH was slightly elevated at 73.5. Her 1,25-hydroxy vitamin D was 46. Her iron was 56. I asked her to make sure she took her multivitamins and calcium daily. Unfortunately, she has often non-compliant with her multivitamins and calcium. Her 25-hydroxy vitamin D values have varied between 19-27. Her calcium values have varied between 8.1-9.4. Her PTH values have remained elevated between 82.9-167. The patient's iron levels have also been low, between 32-34. She is supposed to be taking iron every day as well. Unfortunately, in August of 2014 she discontinued all vitamins and supplements.   C. Fortunately, her recent outpatient psychological therapy with Ms. Rea College, RN, MS has resulted in Alexa [name] having a much more positive attitude  about life in general and a more positive attitude about taking her medications.  3. The patient's last [facility] visit was on 08/06/14. In the interim she has been fairly healthy, but has had a great deal of work stress and home stress, so much that she is feeling overwhelmed. She ran out of Victoza about 10 days ago and essentially stopped taking all of her other medications at that time. She thought that her appointment with Ms. Showfety was this morning, but then found out that it is at 3 PM. She feels that she really needs to keep that appointment and asked if I would cut today's visit short so that she can get to Ms. Showfety's office on time. Ms. Remigio asked me to try to fit her into my schedule on October 10th when she has the day off. I agreed with her assessment and requests.    PAST MEDICAL, FAMILY, AND SOCIAL HISTORY:  Past Medical History  Diagnosis Date  . Diabetes mellitus   . Asthma   . Hypertension   . GERD (gastroesophageal reflux disease)   . CAD (coronary artery disease)   . Elevated homocysteine   . Chronic insomnia   . Diabetes mellitus type II   . Obesity, Class III, BMI 40-49.9 (morbid obesity)   . Combined hyperlipidemia   . Asthma, chronic   . GERD (gastroesophageal  reflux disease)   . Dyspepsia   . Nonproliferative diabetic retinopathy associated with type 2 diabetes mellitus     RESOLVED  . Goiter   . Depression   . Diabetic autonomic neuropathy   . DM type 2 with diabetic peripheral neuropathy   . Vitamin D deficiency   . Hyperparathyroidism , secondary, non-renal   . Tachycardia   . Glossitis   . Thyroiditis, autoimmune   . Fatigue   . Pallor   . Anemia, iron deficiency   . Essential tremor     Family History  Problem Relation Age of Onset  . Thyroid disease Mother     Hypothyroid  . Diabetes Father     T2 DM  . Obesity Brother     250 pounds at a height of 76 inches.  . Thyroid disease Sister     Hypothyroid  . Diabetes Maternal Grandmother    . Colon cancer Other     great grandfather  . Esophageal cancer Neg Hx   . Pancreatic cancer Neg Hx   . Kidney disease Neg Hx   . Liver disease Maternal Aunt     NASH     Current outpatient prescriptions:  .  albuterol (PROVENTIL HFA;VENTOLIN HFA) 108 (90 BASE) MCG/ACT inhaler, Inhale 2 puffs into the lungs every 6 (six) hours as needed for wheezing or shortness of breath., Disp: 1 Inhaler, Rfl: 2 .  Calcium Carbonate-Vitamin D (CALCIUM-VITAMIN D) 500-200 MG-UNIT per tablet, Take 1 tablet by mouth 2 (two) times daily with a meal.  , Disp: , Rfl:  .  conjugated estrogens (PREMARIN) vaginal cream, Place 1 Applicatorful vaginally every 3 (three) days. Place i applicatorful vaginally q 3 days, Disp: 42.5 g, Rfl: 12 .  cyclobenzaprine (FLEXERIL) 10 MG tablet, Take 1 tablet (10 mg total) by mouth 3 (three) times daily as needed for muscle spasms., Disp: 270 tablet, Rfl: 1 .  ferrous fumarate (HEMOCYTE - 106 MG FE) 325 (106 FE) MG TABS tablet, Take one tablet daily., Disp: 90 each, Rfl: 3 .  Fluticasone-Salmeterol (ADVAIR DISKUS) 250-50 MCG/DOSE AEPB, Inhale 1 puff into the lungs 2 (two) times daily., Disp: 1 each, Rfl: 5 .  glucose blood (ACCU-CHEK SMARTVIEW) test strip, Check sugar 6 x daily (Patient taking differently: Check glucose as needed), Disp: 600 each, Rfl: 4 .  HYDROcodone-acetaminophen (NORCO/VICODIN) 5-325 MG per tablet, Take 1 tablet by mouth every 6 (six) hours as needed for moderate pain., Disp: 60 tablet, Rfl: 0 .  HYDROcodone-homatropine (HYCODAN) 5-1.5 MG/5ML syrup, Take 5 mLs by mouth every 8 (eight) hours as needed for cough., Disp: 120 mL, Rfl: 0 .  levofloxacin (LEVAQUIN) 500 MG tablet, Take 1 tablet (500 mg total) by mouth daily., Disp: 10 tablet, Rfl: 0 .  Liraglutide (VICTOZA) 18 MG/3ML SOPN, Inject 1.8 mg daily, Disp: 10 pen, Rfl: 4 .  metFORMIN (GLUCOPHAGE) 500 MG tablet, Take one tablet at breakfast and one at dinner., Disp: 180 tablet, Rfl: 3 .  Multiple Vitamin  (MULTIVITAMIN) tablet, Take 1 tablet by mouth daily.  , Disp: , Rfl:  .  pantoprazole (PROTONIX) 40 MG tablet, Take 1 tablet (40 mg total) by mouth daily., Disp: 90 tablet, Rfl: 3 .  polyethylene glycol powder (GLYCOLAX/MIRALAX) powder, Take 17 g by mouth daily., Disp: 500 g, Rfl: 11 .  SYNTHROID 25 MCG tablet, Take one brand Synthroid tablet daily at breakfast., Disp: 90 tablet, Rfl: 3 .  telmisartan (MICARDIS) 40 MG tablet, Take one tablet each morning., Disp: 90 tablet,  Rfl: 3 .  TRYPTOPHAN PO, Take by mouth. Take 4 tablets ay bedtime, Disp: , Rfl:   Allergies as of 10/08/2014 - Review Complete 09/04/2014  Allergen Reaction Noted  . Theophyllines Palpitations 05/13/2010    1. 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 2. Activities: She will schedule some vacation time this Fall.   3. Smoking, alcohol, or drugs: None 4. Primary Care Provider: Dr. Tommie Ard Baxley 5. Therapist: Ms. Rea College, MS  REVIEW OF SYSTEM: There are no other significant problems involving Diannia's other body systems.   Objective:  Vital Signs:  BP 155/104 mmHg  Pulse 83  Wt 202 lb 12.8 oz (91.989 kg)  LMP 05/11/2010   Ht Readings from Last 3 Encounters:  09/04/14 5\' 4"  (1.626 m)  06/20/14 5\' 4"  (1.626 m)  10/10/13 5\' 4"  (1.626 m)   Wt Readings from Last 3 Encounters:  10/08/14 202 lb 12.8 oz (91.989 kg)  09/04/14 202 lb 3.2 oz (91.717 kg)  08/16/14 205 lb (92.987 kg)   PHYSICAL EXAM:  Constitutional: She has not had any weight change since her last visit. She looks tired, sad, depressed, and very frustrated with her self and with life today. She was close to tears multiple times.    LAB DATA:   Labs 10/08/14: HbA1c 8.0% today.   Labs 08/06/14: HbA1c 7.4%  Labs 08/01/14: TSH 2.014, free T4 0.87, free T3 2.4; PTH 79 (normal 14-64), calcium 9.1; 25-OH vitamin D 35 CMP normal  Labs  05/11/14: HbA1c was 7.7%, without any low BGs; TSH 2.416, free T4 1.13, free T3 2.3  Labs 02/22/14: HbA1c 11.6%  Labs 02/13/14: Calcium 9.0, PTH 131, 25-OH vitamin D 15; CMP normal except for glucose of 165; cholesterol 177, triglycerides 108, HDL 90, LDL 65; urinary microalbumin/creatinine ratio 15.6; TSH 3.639, free T4 1.30, free T3 2.5  Labs 10/10/13: HbA1c is 9.4%, compared with 8.1% at last visit and with 9.3% in December;   Labs 05/08/13: HbA1c 8.1%; PTH 90.2, calcium 9.5, 25-hydroxy vitamin D 46; CMP normal except for glucose of 215; WBC 8.0, RBC 5.20, Hgb 12.5, Hct 37.8%, MCV 72.7 (normal 78-100), iron 55  Labs 02/23/13: CMP normal, except glucose 194 and alkaline phosphatase 126; microalbumin/creatinine ratio was 11.5; TSH 2.086, free T4 1.29, free T3 3.2, TPO antibody < 10; 25-hydroxy vitamin D 18; C-peptide 1.79; cholesterol 156, triglycerides 64, HDL 61, LDL 82  Labs 01/05/13: Hemoglobin A1c was 9.3%, compared with 9.3 at last visit and with 9.1% at the visit prior. All of these values were major increases from 6.6% in May 2012.    Labs 04/14/12: 25-hydroxy vitamin D 21, iron 203,  Vitamin B12 379,   Labs 11/16/11: Hgb 10.3, Hct 32.4%; CMP normal except glucose 146' cholesterol 201, triglycerides 70, HDL 66, LDL 121; vitamin D 22           Labs 06/09/10: Cholesterol was 175, triglycerides 76, HDL 62, LDL 98. TSH was 1.217. Free T4 was 1.10. Free T3 was 2.7. 25-hydroxy vitamin D was 19. PTH was 167.   Assessment and Plan:   ASSESSMENT:  1. Type 2 diabetes mellitus: BG control has worsened, c/w her missing Victoza for at least 7-10 days. She has been tolerating the 1.8 mg dose of Victoza quite well.  2. Hypoglycemia: No subjective complaints  3. Obesity: Her weight has remained the same since last visit.  Her Victoza has been helping her with weight loss. She has not yet resumed exercising. 4-5. Autonomic neuropathy and tachycardia: The patient's autonomic neuropathy and  heart rate have  improved. .  6. Hypertension: Her BP is worse, c/w being off telmisartan for at least the past 7-10 days.  7. Anhedonia and anxiety: She is not doing well today. She needs to resume her medications. She also needs to call for help when she begins to do poorly.  8. GERD/esophagitis, difficulty swallowing/gastroparesis: Her reflux has gotten much worse while off her medications. She will have GI evaluation next month.   PLAN:  1. Diagnostic: Repeat her 25-OH vitamin D, calcium, and PTH as she was supposed to do before today's visit. 2. Therapeutic: Resume medications as currently prescribed. Increase the Citracal D to 3 tablets, twice daily. Order Biotek, 50,000 IU per week. Try to fit in exercise daily.  See ms. Showfety today.  3. Patient education: She must take her medications and supplements. 4. Follow-up: 11/05/14 at 11:15 AM.   Level of Service: This visit lasted in excess of 30 minutes. More than 50% of the visit was devoted to counseling.   Sherrlyn Hock, MD 10/08/2014 2:30 PM

## 2014-10-08 NOTE — Patient Instructions (Signed)
Follow up 11/08/14 at 1115 AM.

## 2014-11-05 ENCOUNTER — Encounter: Payer: Self-pay | Admitting: "Endocrinology

## 2014-11-05 ENCOUNTER — Ambulatory Visit (INDEPENDENT_AMBULATORY_CARE_PROVIDER_SITE_OTHER): Payer: 59 | Admitting: "Endocrinology

## 2014-11-05 VITALS — BP 132/84 | HR 91 | Wt 204.0 lb

## 2014-11-05 DIAGNOSIS — E1143 Type 2 diabetes mellitus with diabetic autonomic (poly)neuropathy: Secondary | ICD-10-CM

## 2014-11-05 DIAGNOSIS — E1165 Type 2 diabetes mellitus with hyperglycemia: Secondary | ICD-10-CM

## 2014-11-05 DIAGNOSIS — R000 Tachycardia, unspecified: Secondary | ICD-10-CM

## 2014-11-05 DIAGNOSIS — E11649 Type 2 diabetes mellitus with hypoglycemia without coma: Secondary | ICD-10-CM

## 2014-11-05 DIAGNOSIS — I10 Essential (primary) hypertension: Secondary | ICD-10-CM

## 2014-11-05 DIAGNOSIS — Z794 Long term (current) use of insulin: Secondary | ICD-10-CM | POA: Diagnosis not present

## 2014-11-05 DIAGNOSIS — R4584 Anhedonia: Secondary | ICD-10-CM

## 2014-11-05 DIAGNOSIS — K219 Gastro-esophageal reflux disease without esophagitis: Secondary | ICD-10-CM

## 2014-11-05 DIAGNOSIS — IMO0001 Reserved for inherently not codable concepts without codable children: Secondary | ICD-10-CM

## 2014-11-05 LAB — GLUCOSE, POCT (MANUAL RESULT ENTRY): POC GLUCOSE: 242 mg/dL — AB (ref 70–99)

## 2014-11-05 NOTE — Progress Notes (Signed)
Subjective:  Patient Name: Alexa [name] Date of Birth: 02/14/1960  MRN: [id number]  Jay Kempe  presents to the office today for follow-up of her type 2 diabetes mellitus, obesity, combined hyperlipidemia, GERD, hypertension, ASHD, dyspepsia, pedal edema, non-proliferative diabetic retinopathy, goiter, depression, autonomic neuropathy, tachycardia, peripheral neuropathy, vitamin D deficiency, hypocalcemia, secondary hyperparathyroidism, glossitis, pallor, fatigue, iron deficiency anemia, anhedonia, disinclination to take medicines, and status post Roux-en-Y gastric bypass.   HISTORY OF PRESENT ILLNESS:   Alexa [name] is a 54 y.o. Caucasian woman. Alexa [name] was unaccompanied.  1. The patient first presented to me on 08/20/04 in referral from her primary care internist, Dr. Emeline General, for evaluation and management of type 2 diabetes, obesity, and multiple medical issues. She was 54 years old.   [name] had a long history of obesity. She was heavy as a child. She underwent menarche at age 23. At age 25 she was 180 pounds. In 1996 she weighed 218 pounds. In 1999 she was diagnosed with type 2 diabetes mellitus. Her weight at that point was 250 pounds. She was treated with Glucophage, and Actos. Actos made her gain even more weight. She had also been treated with glipizide in the past. More recently she had been treated with Lantus and Glucophage plus regular insulin as needed, especially for when she took steroids for asthma attacks. Maximum weight had been 296 pounds one month prior to that first visit with me. Her tendency to gain weight and her difficulty in losing weight were aggravated by long-standing, intermittent depression and by severe, recurrent asthma requiring the use of steroid medications.  B. Past medical history was also positive for a 60% blockage in one of her coronary arteries. She had significant issues with GERD and dyspepsia. She also had combined hyperlipidemia. She had a previous  cardiac catheterization and previous tonsillectomy. She was allergic to theophylline. Her pertinent review of systems was positive for some numbness and tingling in her feet. Family history was positive for type 2 diabetes in her father and her maternal grandmother. Her brother weighed 250 pounds at a height of 76 inches. Both her mother and her sister were hypothyroid.  C. On physical examination, her weight was 279.9 pounds. Her BMI was 49.6. Her blood pressure was 142/86. Her heart rate was 96. Her hemoglobin A1c was 9.8%. She was alert and oriented x3. Her affect was normal. Her insight was fair. She was obviously quite obese. She had a 25 gm thyroid gland. She had 1+ tremor of her hands. She had 1+ DP pulses and 2+ tinea pedis. Sensation to touch was intact in her feet. Laboratory data included a normal CMP. Her cholesterol was 175, triglycerides 76, HDL 53, and LDL 107. Her TSH was 2.98. Since she obviously did have type 2 diabetes mellitus associated with morbid obesity, and since weight loss was a major factor for her, I asked her to resume her metformin twice daily. I also started her on Byetta, initially 5 mcg twice daily and then 10 mcg twice daily. I discontinued her Lantus insulin.  2. During the last 10 years,we have had some major successes and some new problem areas.  A. T2DM: In October 2007 the combination of Byetta and metformin was causing more gastrointestinal problems. Patient opted to stop the metformin and continue the Byetta because it was helping her with weight control and blood sugar control. By 08/11/06 her weight had decreased to 267.6 pounds. Hemoglobin A1c was 8.6%. At that point she decided to have  bariatric surgery. She had a Roux-en-Y gastric bypass on 12/20/2006. She subsequently lost weight down to 173.4 pounds on 05/23/08, but then subsequently regained weight to the 190s. Her  hemoglobin A1c values varied in parallel with her weight. On 05/23/08, at the point of her lowest  weight, her hemoglobin A1c dropped to a nadir of 6.3%.  Since then her hemoglobin A1c values have varied between 6.6 and 10.3%. Although we were initially able to stop all of her diabetes medicines after bariatric surgery, when she began to regain weight we restarted metformin, 500 mg twice daily. Although she took her metformin twice daily for a long time, she had discontinued it many months ago. She hated to take medicines and could not make herself be compliant in taking medications or exercising. When her HbA1c increased to 9.4% in September 2015, I stopped her Byetta injections and started her on liraglutide (Victoza) daily injections on 11/16/13.  B. Vitamin D deficiency, hypocalcemia, and secondary hyperparathyroidism: Just before her gastric bypass, I obtained baseline bone mineral metabolism studies. Her 25-hydroxy vitamin D was very low at 7 (normal greater than or equal to 30). Her calcium was 9.5 (normal 8.6-10.6). Her parathyroid hormone was 38.4 (normal 14-72). Subsequent to her surgery, the patient was supposed to be taking multivitamins with calcium and vitamin D, but did not always do so. On 03/29/2007 her 25-hydroxy vitamin D was 29, but her calcium decreased to 8.7. Her PTH was slightly elevated at 73.5. Her 1,25-hydroxy vitamin D was 46. Her iron was 56. I asked her to make sure she took her multivitamins and calcium daily. Unfortunately, she has often non-compliant with her multivitamins and calcium. Her 25-hydroxy vitamin D values have varied between 19-27. Her calcium values have varied between 8.1-9.4. Her PTH values have remained elevated between 82.9-167. The patient's iron levels have also been low, between 32-34. She is supposed to be taking iron every day as well. Unfortunately, in August of 2014 she discontinued all vitamins and supplements.   C. Fortunately, her recent outpatient psychological therapy with Ms. Rea College, RN, MS has resulted in [place] having a much more positive  attitude about life in general and a more positive attitude about taking her medications.  3. The patient's last [facility] visit was on 10/08/14. In the interim she has been fairly healthy, but still has had a great deal of work stress and home stress, so much that she is feeling overwhelmed. She is having more problems with anxiety and with sleeping again. She is taking her morning medications, to include her Lexapro, but often misses her evening meds, to include Victoza, iron, calcium, and MVI. She continues to see Ms. [employer].  She also saw Dr. Olevia Perches in GI who diagnosed reflux. Unfortunately, Protonix is one of those meds that she has been missing.   PAST MEDICAL, FAMILY, AND SOCIAL HISTORY:  Past Medical History  Diagnosis Date  . Diabetes mellitus   . Asthma   . Hypertension   . GERD (gastroesophageal reflux disease)   . CAD (coronary artery disease)   . Elevated homocysteine ([place])   . Chronic insomnia   . Diabetes mellitus type II   . Obesity, Class III, BMI 40-49.9 (morbid obesity) ([place])   . Combined hyperlipidemia   . Asthma, chronic   . GERD (gastroesophageal reflux disease)   . Dyspepsia   . Nonproliferative diabetic retinopathy associated with type 2 diabetes mellitus ([place])     RESOLVED  . Goiter   . Depression   .  Diabetic autonomic neuropathy ([place])   . DM type 2 with diabetic peripheral neuropathy ([place])   . Vitamin D deficiency   . Hyperparathyroidism , secondary, non-renal ([place])   . Tachycardia   . Glossitis   . Thyroiditis, autoimmune   . Fatigue   . Pallor   . Anemia, iron deficiency   . Essential tremor     Family History  Problem Relation Age of Onset  . Thyroid disease Mother     Hypothyroid  . Diabetes Father     T2 DM  . Obesity Brother     250 pounds at a height of 76 inches.  . Thyroid disease Sister     Hypothyroid  . Diabetes Maternal Grandmother   . Colon cancer Other     great grandfather  . Esophageal cancer Neg Hx   . Pancreatic  cancer Neg Hx   . Kidney disease Neg Hx   . Liver disease Maternal Aunt     NASH     Current outpatient prescriptions:  .  albuterol (PROVENTIL HFA;VENTOLIN HFA) 108 (90 BASE) MCG/ACT inhaler, Inhale 2 puffs into the lungs every 6 (six) hours as needed for wheezing or shortness of breath., Disp: 1 Inhaler, Rfl: 2 .  Calcium Carbonate-Vitamin D (CALCIUM-VITAMIN D) 500-200 MG-UNIT per tablet, Take 1 tablet by mouth 2 (two) times daily with a meal.  , Disp: , Rfl:  .  conjugated estrogens (PREMARIN) vaginal cream, Place 1 Applicatorful vaginally every 3 (three) days. Place i applicatorful vaginally q 3 days, Disp: 42.5 g, Rfl: 12 .  cyclobenzaprine (FLEXERIL) 10 MG tablet, Take 1 tablet (10 mg total) by mouth 3 (three) times daily as needed for muscle spasms., Disp: 270 tablet, Rfl: 1 .  ferrous fumarate (HEMOCYTE - 106 MG FE) 325 (106 FE) MG TABS tablet, Take one tablet daily., Disp: 90 each, Rfl: 3 .  Fluticasone-Salmeterol (ADVAIR DISKUS) 250-50 MCG/DOSE AEPB, Inhale 1 puff into the lungs 2 (two) times daily., Disp: 1 each, Rfl: 5 .  glucose blood (ACCU-CHEK SMARTVIEW) test strip, Check sugar 6 x daily (Patient taking differently: Check glucose as needed), Disp: 600 each, Rfl: 4 .  HYDROcodone-acetaminophen (NORCO/VICODIN) 5-325 MG per tablet, Take 1 tablet by mouth every 6 (six) hours as needed for moderate pain., Disp: 60 tablet, Rfl: 0 .  HYDROcodone-homatropine (HYCODAN) 5-1.5 MG/5ML syrup, Take 5 mLs by mouth every 8 (eight) hours as needed for cough., Disp: 120 mL, Rfl: 0 .  levofloxacin (LEVAQUIN) 500 MG tablet, Take 1 tablet (500 mg total) by mouth daily., Disp: 10 tablet, Rfl: 0 .  metFORMIN (GLUCOPHAGE) 500 MG tablet, Take one tablet at breakfast and one at dinner., Disp: 180 tablet, Rfl: 3 .  Multiple Vitamin (MULTIVITAMIN) tablet, Take 1 tablet by mouth daily.  , Disp: , Rfl:  .  pantoprazole (PROTONIX) 40 MG tablet, Take 1 tablet (40 mg total) by mouth daily., Disp: 90 tablet, Rfl:  3 .  polyethylene glycol powder (GLYCOLAX/MIRALAX) powder, Take 17 g by mouth daily., Disp: 500 g, Rfl: 11 .  SYNTHROID 25 MCG tablet, Take one brand Synthroid tablet daily at breakfast., Disp: 90 tablet, Rfl: 3 .  telmisartan (MICARDIS) 40 MG tablet, Take one tablet each morning., Disp: 90 tablet, Rfl: 3 .  TRYPTOPHAN PO, Take by mouth. Take 4 tablets ay bedtime, Disp: , Rfl:  .  VICTOZA 18 MG/3ML SOPN, Inject 1.8 mg daily, Disp: 12 pen, Rfl: 4  Allergies as of 11/05/2014 - Review Complete 10/08/2014  Allergen Reaction Noted  .  Theophyllines Palpitations 05/13/2010    1. 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 2. Activities: She hopes to get a few days off around Thanksgiving. She has reduced her church activities.  3. Smoking, alcohol, or drugs: None 4. Primary Care Provider: Dr. Tommie Ard Baxley 5. Therapist: Ms. Rea College, MS  REVIEW OF SYSTEM: There are no other significant problems involving Valarie's other body systems.   Objective:  Vital Signs:  BP 132/84 mmHg  Pulse 91  Wt 204 lb (92.534 kg)  LMP 05/11/2010   Ht Readings from Last 3 Encounters:  09/04/14 5\' 4"  (1.626 m)  06/20/14 5\' 4"  (1.626 m)  10/10/13 5\' 4"  (1.626 m)   Wt Readings from Last 3 Encounters:  11/05/14 204 lb (92.534 kg)  10/08/14 202 lb 12.8 oz (91.989 kg)  09/04/14 202 lb 3.2 oz (91.717 kg)   PHYSICAL EXAM:  Constitutional: The patient looks healthy and appears physically and emotionally well, but stressed and more anxious. As she talked about her issues she became tearful several times.     LAB DATA:   Labs 10/08/14: HbA1c 8.0%   Labs 08/06/14: HbA1c 7.4%  Labs 08/01/14: TSH 2.014, free T4 0.87, free T3 2.4; PTH 79 (normal 14-64), calcium 9.1; 25-OH vitamin D 35 CMP normal  Labs 05/11/14: HbA1c was 7.7%, without any low BGs; TSH 2.416, free T4 1.13, free T3 2.3  Labs  02/22/14: HbA1c 11.6%  Labs 02/13/14: Calcium 9.0, PTH 131, 25-OH vitamin D 15; CMP normal except for glucose of 165; cholesterol 177, triglycerides 108, HDL 90, LDL 65; urinary microalbumin/creatinine ratio 15.6; TSH 3.639, free T4 1.30, free T3 2.5  Labs 10/10/13: HbA1c is 9.4%, compared with 8.1% at last visit and with 9.3% in December;   Labs 05/08/13: HbA1c 8.1%; PTH 90.2, calcium 9.5, 25-hydroxy vitamin D 46; CMP normal except for glucose of 215; WBC 8.0, RBC 5.20, Hgb 12.5, Hct 37.8%, MCV 72.7 (normal 78-100), iron 55  Labs 02/23/13: CMP normal, except glucose 194 and alkaline phosphatase 126; microalbumin/creatinine ratio was 11.5; TSH 2.086, free T4 1.29, free T3 3.2, TPO antibody < 10; 25-hydroxy vitamin D 18; C-peptide 1.79; cholesterol 156, triglycerides 64, HDL 61, LDL 82  Labs 01/05/13: Hemoglobin A1c was 9.3%, compared with 9.3 at last visit and with 9.1% at the visit prior. All of these values were major increases from 6.6% in May 2012.    Labs 04/14/12: 25-hydroxy vitamin D 21, iron 203,  Vitamin B12 379,   Labs 11/16/11: Hgb 10.3, Hct 32.4%; CMP normal except glucose 146' cholesterol 201, triglycerides 70, HDL 66, LDL 121; vitamin D 22           Labs 06/09/10: Cholesterol was 175, triglycerides 76, HDL 62, LDL 98. TSH was 1.217. Free T4 was 1.10. Free T3 was 2.7. 25-hydroxy vitamin D was 19. PTH was 167.   Assessment and Plan:   ASSESSMENT:  1. Type 2 diabetes mellitus: Her HbA1c is higher at this visit. BG control will vary with her diet, her stress level, and her ability to take her medications as prescribed. She will have better BGs if she can take her Victoza daily.  2. Hypoglycemia: No subjective complaints  3. Obesity: Her weight has increased two pounds, equivalent to an extra 220 calories per day. She needs to review her carb intake and reduce the carb intake as she can. Her Victoza  had been helping her with weight loss when she took it regularly.  4-5. Autonomic  neuropathy and tachycardia: The patient's autonomic neuropathy and  heart rate have worsened since her last visit, paralleling her weight gain and her rise in HbA1c.   6. Hypertension: Her BP is better.   7. Anhedonia and anxiety: She is not doing well today, but is much better than at her last visit.. She needs to resume her medications. She also needs to call for help when she begins to do poorly. She also needs to find ways in which to re-charge both her physical batteries and her emotional batteries. 8. GERD/esophagitis, difficulty swallowing/gastroparesis: Her reflux has not improved or worsened while off her medications.   PLAN:  1. Diagnostic: Repeat her 25-OH vitamin D, calcium, and PTH as she was supposed to do before today's visit. 2. Therapeutic: Resume medications as currently prescribed. Increase the Citracal D to 3 tablets, twice daily. Order Biotek, 50,000 IU per week. Try to fit in exercise daily.  See Ms. Showfety in follow up.   3. Patient education: She must take her medications and supplements. 4. Follow-up: 2 months Level of Service: This visit lasted in excess of 30 minutes. More than 50% of the visit was devoted to counseling.   Sherrlyn Hock, MD 11/05/2014 11:44 AM

## 2014-11-05 NOTE — Patient Instructions (Signed)
Follow up visit in two months.  

## 2014-11-12 ENCOUNTER — Encounter: Payer: Self-pay | Admitting: Internal Medicine

## 2014-11-12 LAB — HM DIABETES EYE EXAM

## 2014-11-13 ENCOUNTER — Telehealth: Payer: Self-pay

## 2014-11-13 NOTE — Telephone Encounter (Signed)
Left message for patient to call office to schedule appointment for prevnar vaccine.

## 2014-11-22 ENCOUNTER — Telehealth: Payer: Self-pay

## 2014-11-22 NOTE — Telephone Encounter (Signed)
Left message for patient to call office to schedule prevnar injection for noon on 11/26/2014.  Advised patient to call and confirm.

## 2015-02-19 DIAGNOSIS — E559 Vitamin D deficiency, unspecified: Secondary | ICD-10-CM | POA: Diagnosis not present

## 2015-02-19 DIAGNOSIS — E211 Secondary hyperparathyroidism, not elsewhere classified: Secondary | ICD-10-CM | POA: Diagnosis not present

## 2015-02-20 LAB — PTH, INTACT AND CALCIUM
Calcium: 8.7 mg/dL (ref 8.4–10.5)
PTH: 99 pg/mL — ABNORMAL HIGH (ref 14–64)

## 2015-02-20 LAB — VITAMIN D 25 HYDROXY (VIT D DEFICIENCY, FRACTURES): VIT D 25 HYDROXY: 20 ng/mL — AB (ref 30–100)

## 2015-02-26 ENCOUNTER — Encounter: Payer: Self-pay | Admitting: "Endocrinology

## 2015-02-26 ENCOUNTER — Ambulatory Visit (INDEPENDENT_AMBULATORY_CARE_PROVIDER_SITE_OTHER): Payer: 59 | Admitting: "Endocrinology

## 2015-02-26 VITALS — BP 132/86 | HR 84 | Wt 206.2 lb

## 2015-02-26 DIAGNOSIS — E1165 Type 2 diabetes mellitus with hyperglycemia: Secondary | ICD-10-CM | POA: Diagnosis not present

## 2015-02-26 DIAGNOSIS — E11649 Type 2 diabetes mellitus with hypoglycemia without coma: Secondary | ICD-10-CM

## 2015-02-26 DIAGNOSIS — E1143 Type 2 diabetes mellitus with diabetic autonomic (poly)neuropathy: Secondary | ICD-10-CM

## 2015-02-26 DIAGNOSIS — Z794 Long term (current) use of insulin: Secondary | ICD-10-CM

## 2015-02-26 DIAGNOSIS — N2581 Secondary hyperparathyroidism of renal origin: Secondary | ICD-10-CM

## 2015-02-26 DIAGNOSIS — E1142 Type 2 diabetes mellitus with diabetic polyneuropathy: Secondary | ICD-10-CM

## 2015-02-26 DIAGNOSIS — R4584 Anhedonia: Secondary | ICD-10-CM

## 2015-02-26 DIAGNOSIS — E559 Vitamin D deficiency, unspecified: Secondary | ICD-10-CM

## 2015-02-26 DIAGNOSIS — R000 Tachycardia, unspecified: Secondary | ICD-10-CM

## 2015-02-26 DIAGNOSIS — I10 Essential (primary) hypertension: Secondary | ICD-10-CM

## 2015-02-26 DIAGNOSIS — IMO0001 Reserved for inherently not codable concepts without codable children: Secondary | ICD-10-CM

## 2015-02-26 DIAGNOSIS — F331 Major depressive disorder, recurrent, moderate: Secondary | ICD-10-CM | POA: Diagnosis not present

## 2015-02-26 LAB — GLUCOSE, POCT (MANUAL RESULT ENTRY): POC Glucose: 224 mg/dl — AB (ref 70–99)

## 2015-02-26 NOTE — Patient Instructions (Signed)
Follow up visit in 2 months.  

## 2015-02-26 NOTE — Progress Notes (Signed)
Subjective:  Patient Name: Alexa [name] Date of Birth: 02/14/1960  MRN: [id number]  Jay Kempe  presents to the office today for follow-up of her type 2 diabetes mellitus, obesity, combined hyperlipidemia, GERD, hypertension, ASHD, dyspepsia, pedal edema, non-proliferative diabetic retinopathy, goiter, depression, autonomic neuropathy, tachycardia, peripheral neuropathy, vitamin D deficiency, hypocalcemia, secondary hyperparathyroidism, glossitis, pallor, fatigue, iron deficiency anemia, anhedonia, disinclination to take medicines, and status post Roux-en-Y gastric bypass.   HISTORY OF PRESENT ILLNESS:   Alexa [name] is a 55 y.o. Caucasian woman. Alexa [name] was unaccompanied.  1. The patient first presented to me on 08/20/04 in referral from her primary care internist, Dr. Emeline General, for evaluation and management of type 2 diabetes, obesity, and multiple medical issues. She was 55 years old.   [name] had a long history of obesity. She was heavy as a child. She underwent menarche at age 23. At age 25 she was 180 pounds. In 1996 she weighed 218 pounds. In 1999 she was diagnosed with type 2 diabetes mellitus. Her weight at that point was 250 pounds. She was treated with Glucophage, and Actos. Actos made her gain even more weight. She had also been treated with glipizide in the past. More recently she had been treated with Lantus and Glucophage plus regular insulin as needed, especially for when she took steroids for asthma attacks. Maximum weight had been 296 pounds one month prior to that first visit with me. Her tendency to gain weight and her difficulty in losing weight were aggravated by long-standing, intermittent depression and by severe, recurrent asthma requiring the use of steroid medications.  B. Past medical history was also positive for a 60% blockage in one of her coronary arteries. She had significant issues with GERD and dyspepsia. She also had combined hyperlipidemia. She had a previous  cardiac catheterization and previous tonsillectomy. She was allergic to theophylline. Her pertinent review of systems was positive for some numbness and tingling in her feet. Family history was positive for type 2 diabetes in her father and her maternal grandmother. Her brother weighed 250 pounds at a height of 76 inches. Both her mother and her sister were hypothyroid.  C. On physical examination, her weight was 279.9 pounds. Her BMI was 49.6. Her blood pressure was 142/86. Her heart rate was 96. Her hemoglobin A1c was 9.8%. She was alert and oriented x3. Her affect was normal. Her insight was fair. She was obviously quite obese. She had a 25 gm thyroid gland. She had 1+ tremor of her hands. She had 1+ DP pulses and 2+ tinea pedis. Sensation to touch was intact in her feet. Laboratory data included a normal CMP. Her cholesterol was 175, triglycerides 76, HDL 53, and LDL 107. Her TSH was 2.98. Since she obviously did have type 2 diabetes mellitus associated with morbid obesity, and since weight loss was a major factor for her, I asked her to resume her metformin twice daily. I also started her on Byetta, initially 5 mcg twice daily and then 10 mcg twice daily. I discontinued her Lantus insulin.  2. During the last 10 years,we have had some major successes and some new problem areas.  A. T2DM: In October 2007 the combination of Byetta and metformin was causing more gastrointestinal problems. Patient opted to stop the metformin and continue the Byetta because it was helping her with weight control and blood sugar control. By 08/11/06 her weight had decreased to 267.6 pounds. Hemoglobin A1c was 8.6%. At that point she decided to have  bariatric surgery. She had a Roux-en-Y gastric bypass on 12/20/2006. She subsequently lost weight down to 173.4 pounds on 05/23/08, but then subsequently regained weight to the 190s. Her  hemoglobin A1c values varied in parallel with her weight. On 05/23/08, at the point of her lowest  weight, her hemoglobin A1c dropped to a nadir of 6.3%.  Since then her hemoglobin A1c values have varied between 6.6 and 10.3%. Although we were initially able to stop all of her diabetes medicines after bariatric surgery, when she began to regain weight we restarted metformin, 500 mg twice daily. Although she took her metformin twice daily for a long time, she had discontinued it many months ago. She hated to take medicines and could not make herself be compliant in taking medications or exercising. When her HbA1c increased to 9.4% in September 2015, I stopped her Byetta injections and started her on liraglutide (Victoza) daily injections on 11/16/13.  B. Vitamin D deficiency, hypocalcemia, and secondary hyperparathyroidism: Just before her gastric bypass, I obtained baseline bone mineral metabolism studies. Her 25-hydroxy vitamin D was very low at 7 (normal greater than or equal to 30). Her calcium was 9.5 (normal 8.6-10.6). Her parathyroid hormone was 38.4 (normal 14-72). Subsequent to her surgery, the patient was supposed to be taking multivitamins with calcium and vitamin D, but did not always do so. On 03/29/2007 her 25-hydroxy vitamin D was 29, but her calcium decreased to 8.7. Her PTH was slightly elevated at 73.5. Her 1,25-hydroxy vitamin D was 46. Her iron was 56. I asked her to make sure she took her multivitamins and calcium daily. Unfortunately, she has often been non-compliant with her multivitamins and calcium. Her 25-hydroxy vitamin D values have varied between 15-35. Her calcium values have varied between 8.1-9.4. Her PTH values have remained elevated between 79-167. The patient's iron levels have also been low, between 32-34. She is supposed to be taking iron every day as well. Unfortunately, in August of 2014 she discontinued all vitamins and supplements.   C. Fortunately, her recent outpatient psychological therapy with Ms. Rea College, RN, MS has resulted in [place] having a much more  positive attitude about life in general and a more positive attitude about taking her medications.  3. The patient's last [facility] visit was on 11/05/14.   A. In the interim she has been fairly healthy. She has not been doing much walking.   B. In October, "God cured me of excess anxiety." She is still has problems with works stress and other issues, but she is just not anxious about them. She is sleeping well at night. She continues to see Ms. [employer]. She has consciously reduced her church activities and transferred the responsibilities for many of them to others.   C. She is being more careful with her diet and is doing less anxiety-induced eating. She is taking her morning medications, to include her Lexapro and Synthroid. She is also taking her Victoza at night. She often misses her other evening meds, to include Protonix, metformin, iron, calcium, and MVI.     D. In the past few months she has noted some drawing in of her both thumbs and the left 4th and 5th fingers. She will see Dr. Dwyane Dee, her hand surgeon, soon.   PAST MEDICAL, FAMILY, AND SOCIAL HISTORY:  Past Medical History  Diagnosis Date  . Diabetes mellitus   . Asthma   . Hypertension   . GERD (gastroesophageal reflux disease)   . CAD (coronary artery disease)   .  Elevated homocysteine ([place])   . Chronic insomnia   . Diabetes mellitus type II   . Obesity, Class III, BMI 40-49.9 (morbid obesity) ([place])   . Combined hyperlipidemia   . Asthma, chronic   . GERD (gastroesophageal reflux disease)   . Dyspepsia   . Nonproliferative diabetic retinopathy associated with type 2 diabetes mellitus ([place])     RESOLVED  . Goiter   . Depression   . Diabetic autonomic neuropathy ([place])   . DM type 2 with diabetic peripheral neuropathy ([place])   . Vitamin D deficiency   . Hyperparathyroidism , secondary, non-renal ([place])   . Tachycardia   . Glossitis   . Thyroiditis, autoimmune   . Fatigue   . Pallor   . Anemia, iron deficiency   .  Essential tremor     Family History  Problem Relation Age of Onset  . Thyroid disease Mother     Hypothyroid  . Diabetes Father     T2 DM  . Obesity Brother     250 pounds at a height of 76 inches.  . Thyroid disease Sister     Hypothyroid  . Diabetes Maternal Grandmother   . Colon cancer Other     great grandfather  . Esophageal cancer Neg Hx   . Pancreatic cancer Neg Hx   . Kidney disease Neg Hx   . Liver disease Maternal Aunt     NASH     Current outpatient prescriptions:  .  escitalopram (LEXAPRO) 10 MG tablet, Take 10 mg by mouth daily., Disp: , Rfl:  .  levothyroxine (SYNTHROID, LEVOTHROID) 25 MCG tablet, Take 25 mcg by mouth daily before breakfast., Disp: , Rfl:  .  metFORMIN (GLUCOPHAGE) 500 MG tablet, Take one tablet at breakfast and one at dinner., Disp: 180 tablet, Rfl: 3 .  telmisartan (MICARDIS) 40 MG tablet, Take one tablet each morning., Disp: 90 tablet, Rfl: 3 .  VICTOZA 18 MG/3ML SOPN, Inject 1.8 mg daily, Disp: 12 pen, Rfl: 4 .  albuterol (PROVENTIL HFA;VENTOLIN HFA) 108 (90 BASE) MCG/ACT inhaler, Inhale 2 puffs into the lungs every 6 (six) hours as needed for wheezing or shortness of breath. (Patient not taking: Reported on 02/26/2015), Disp: 1 Inhaler, Rfl: 2 .  Calcium Carbonate-Vitamin D (CALCIUM-VITAMIN D) 500-200 MG-UNIT per tablet, Take 1 tablet by mouth 2 (two) times daily with a meal. Reported on 02/26/2015, Disp: , Rfl:  .  conjugated estrogens (PREMARIN) vaginal cream, Place 1 Applicatorful vaginally every 3 (three) days. Place i applicatorful vaginally q 3 days (Patient not taking: Reported on 02/26/2015), Disp: 42.5 g, Rfl: 12 .  cyclobenzaprine (FLEXERIL) 10 MG tablet, Take 1 tablet (10 mg total) by mouth 3 (three) times daily as needed for muscle spasms. (Patient not taking: Reported on 02/26/2015), Disp: 270 tablet, Rfl: 1 .  ferrous fumarate (HEMOCYTE - 106 MG FE) 325 (106 FE) MG TABS tablet, Take one tablet daily., Disp: 90 each, Rfl: 3 .   Fluticasone-Salmeterol (ADVAIR DISKUS) 250-50 MCG/DOSE AEPB, Inhale 1 puff into the lungs 2 (two) times daily. (Patient not taking: Reported on 02/26/2015), Disp: 1 each, Rfl: 5 .  glucose blood (ACCU-CHEK SMARTVIEW) test strip, Check sugar 6 x daily (Patient not taking: Reported on 02/26/2015), Disp: 600 each, Rfl: 4 .  HYDROcodone-acetaminophen (NORCO/VICODIN) 5-325 MG per tablet, Take 1 tablet by mouth every 6 (six) hours as needed for moderate pain. (Patient not taking: Reported on 02/26/2015), Disp: 60 tablet, Rfl: 0 .  HYDROcodone-homatropine (HYCODAN) 5-1.5 MG/5ML syrup, Take 5  mLs by mouth every 8 (eight) hours as needed for cough. (Patient not taking: Reported on 02/26/2015), Disp: 120 mL, Rfl: 0 .  levofloxacin (LEVAQUIN) 500 MG tablet, Take 1 tablet (500 mg total) by mouth daily. (Patient not taking: Reported on 02/26/2015), Disp: 10 tablet, Rfl: 0 .  Multiple Vitamin (MULTIVITAMIN) tablet, Take 1 tablet by mouth daily. Reported on 02/26/2015, Disp: , Rfl:  .  pantoprazole (PROTONIX) 40 MG tablet, Take 1 tablet (40 mg total) by mouth daily. (Patient not taking: Reported on 02/26/2015), Disp: 90 tablet, Rfl: 3 .  polyethylene glycol powder (GLYCOLAX/MIRALAX) powder, Take 17 g by mouth daily. (Patient not taking: Reported on 02/26/2015), Disp: 500 g, Rfl: 11 .  SYNTHROID 25 MCG tablet, Take one brand Synthroid tablet daily at breakfast., Disp: 90 tablet, Rfl: 3 .  TRYPTOPHAN PO, Take by mouth. Reported on 02/26/2015, Disp: , Rfl:   Allergies as of 02/26/2015 - Review Complete 02/26/2015  Allergen Reaction Noted  . Theophyllines Palpitations 05/13/2010    1. 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 2. Activities: She is trying to take more time for herself. She has reduced her church activities. She and her husband recently purchased a house just off the [facility] in [place], [place]  and are enjoying getting away. . 3. Smoking, alcohol, or drugs: None 4. Primary Care Provider: Dr. Tommie Ard Baxley 5. Therapist: Ms. Rea College, MS  REVIEW OF SYSTEM: There are no other significant problems involving Ilda's other body systems.   Objective:  Vital Signs:  BP 132/86 mmHg  Pulse 84  Wt 206 lb 3.2 oz (93.532 kg)  LMP 05/11/2010   Ht Readings from Last 3 Encounters:  09/04/14 5\' 4"  (1.626 m)  06/20/14 5\' 4"  (1.626 m)  10/10/13 5\' 4"  (1.626 m)   Wt Readings from Last 3 Encounters:  02/26/15 206 lb 3.2 oz (93.532 kg)  11/05/14 204 lb (92.534 kg)  10/08/14 202 lb 12.8 oz (91.989 kg)   PHYSICAL EXAM:  Constitutional:  The patient looks healthy and appears physically and emotionally well. She has gained 4 pounds since her last visit, equivalent to about 110 excess calories per day.  Eyes: There is no arcus or proptosis.  Mouth: The oropharynx appears normal. The tongue appears normal. There is normal oral moisture. There is no obvious gingivitis. Neck: There are no bruits present. The thyroid gland appears normal in size. The thyroid gland is approximately 20-20+ grams in size. The right lobe is normal in size. The left lobe is top-normal in size or just slightly enlarged. The consistency of the thyroid gland is normal. There is no thyroid tenderness to palpation. Lungs: The lungs are clear. Air movement is good. Heart: The heart rhythm and rate appear normal. Heart sounds S1 and S2 are normal. I do not appreciate any pathologic heart murmurs. Abdomen: The abdomen is quite enlarged. Bowel sounds are normal. The abdomen is soft and non-tender. There is no obviously palpable hepatomegaly, splenomegaly, or other masses.  Arms: Muscle mass appears appropriate for age. Radial pulses appear normal. Hands: There is no obvious tremor. Phalangeal and metacarpophalangeal joints appear normal. Palms are normal. Legs: Muscle mass appears appropriate for age. There is no edema.   Feet: There are no significant deformities. Dorsalis pedis pulses are normal bilaterally.  Neurologic: Muscle strength is normal for age and gender  in both the  upper and the lower extremities. Muscle tone appears normal. Sensation to touch is normal in the legs and feet.    LAB DATA:   Labs 02/19/15: PTH 99, calcium 8.7, 25-OH vitamin D 20  Labs 10/08/14: HbA1c 8.0%   Labs 08/06/14: HbA1c 7.4%  Labs 08/01/14: TSH 2.014, free T4 0.87, free T3 2.4; PTH 79 (normal 14-64), calcium 9.1; 25-OH vitamin D 35 CMP normal  Labs 05/11/14: HbA1c was 7.7%, without any low BGs; TSH 2.416, free T4 1.13, free T3 2.3  Labs 02/22/14: HbA1c 11.6%  Labs 02/13/14: Calcium 9.0, PTH 131, 25-OH vitamin D 15; CMP normal except for glucose of 165; cholesterol 177, triglycerides 108, HDL 90, LDL 65; urinary microalbumin/creatinine ratio 15.6; TSH 3.639, free T4 1.30, free T3 2.5  Labs 10/10/13: HbA1c is 9.4%, compared with 8.1% at last visit and with 9.3% in December;   Labs 05/08/13: HbA1c 8.1%; PTH 90.2, calcium 9.5, 25-hydroxy vitamin D 46; CMP normal except for glucose of 215; WBC 8.0, RBC 5.20, Hgb 12.5, Hct 37.8%, MCV 72.7 (normal 78-100), iron 55  Labs 02/23/13: CMP normal, except glucose 194 and alkaline phosphatase 126; microalbumin/creatinine ratio was 11.5; TSH 2.086, free T4 1.29, free T3 3.2, TPO antibody < 10; 25-hydroxy vitamin D 18; C-peptide 1.79; cholesterol 156, triglycerides 64, HDL 61, LDL 82  Labs 01/05/13: Hemoglobin A1c was 9.3%, compared with 9.3 at last visit and with 9.1% at the visit prior. All of these values were major increases from 6.6% in May 2012.    Labs 04/14/12: 25-hydroxy vitamin D 21, iron 203,  Vitamin B12 379,   Labs 11/16/11: Hgb 10.3, Hct 32.4%; CMP normal except glucose 146' cholesterol 201, triglycerides 70, HDL 66, LDL 121; vitamin D 22           Labs 06/09/10: Cholesterol was 175, triglycerides 76, HDL 62, LDL 98. TSH was 1.217. Free T4 was 1.10. Free T3 was 2.7. 25-hydroxy  vitamin D was 19. PTH was 167.   Assessment and Plan:   ASSESSMENT:  1. Type 2 diabetes mellitus: She says that she is now taking Victoza reliably. BG control will vary with her diet, her stress level, and her ability to take her medications as prescribed. She will have better BGs if she can take her Victoza daily.  2. Hypoglycemia: No subjective complaints  3. Obesity: Her weight has increased four pounds, equivalent to an extra 110 calories per day. She needs to review her carb intake and reduce the carb intake as she can. Her Victoza had been helping her with weight loss when she took it regularly.  4-5. Autonomic neuropathy and tachycardia: The patient's autonomic neuropathy and  heart rate have improved since her last visit.  6. Hypertension: Her BP is about the same.   7. Anhedonia and anxiety: She is doing much better today. She needs to continue to take her medications. She also needs to call for help when she begins to do poorly. She also needs to continue to find ways to have more joy in her life. 8-12. GERD/esophagitis, difficulty swallowing/gastroparesis: Her reflux has improved.  13-15: Hypocalcemia, vitamin D deficiency, and secondary hyperparathyroidism: Due to her bariatric surgery she has more difficulty absorbing vitamin D and calcium. When she develops hypocalcemia and vitamin D deficiency, her parathyroid glands appropriated produce more PTH, resulting in secondary hyperparathyroidism. However, if she takes her calcium and vitamin D all three parameters will normalize.   PLAN:  1. Diagnostic: Repeat her 25-OH vitamin D, calcium, and PTH in 4  months.  2. Therapeutic: Resume medications as currently prescribed. Increase the Citracal D to 3 tablets, twice daily. Order Biotek, 50,000 IU per week. Try to fit in exercise daily.  See Ms. Showfety in follow up.   3. Patient education: She must take her medications and supplements. 4. Follow-up: 2 months Level of Service: This visit  lasted in excess of 50 minutes. More than 50% of the visit was devoted to counseling.   Sherrlyn Hock, MD 02/26/2015 10:49 AM

## 2015-03-05 ENCOUNTER — Other Ambulatory Visit: Payer: Self-pay | Admitting: "Endocrinology

## 2015-03-05 MED FILL — TELMISARTAN 40 MG TABLET: 40 | 90 days supply | Qty: 90 | Fill #0

## 2015-03-05 MED FILL — metFORMIN HCL 500 MG TABS: 500 | 90 days supply | Qty: 180 | Fill #0

## 2015-03-05 MED FILL — SYNTHROID 25 MCG TABLET: 25 | 90 days supply | Qty: 90 | Fill #0

## 2015-04-10 MED FILL — ESCITALOPRAM 20 MG TABLET: 20 | 90 days supply | Qty: 90 | Fill #2

## 2015-04-29 ENCOUNTER — Ambulatory Visit: Payer: 59 | Admitting: "Endocrinology

## 2015-04-29 DIAGNOSIS — F331 Major depressive disorder, recurrent, moderate: Secondary | ICD-10-CM | POA: Diagnosis not present

## 2015-05-20 MED FILL — PANTOPRAZOLE SOD DR 40 MG T: 40 | 90 days supply | Qty: 90 | Fill #2

## 2015-05-23 DIAGNOSIS — M65341 Trigger finger, right ring finger: Secondary | ICD-10-CM | POA: Diagnosis not present

## 2015-05-23 DIAGNOSIS — M65351 Trigger finger, right little finger: Secondary | ICD-10-CM | POA: Diagnosis not present

## 2015-05-23 DIAGNOSIS — M65332 Trigger finger, left middle finger: Secondary | ICD-10-CM | POA: Diagnosis not present

## 2015-05-23 DIAGNOSIS — M65342 Trigger finger, left ring finger: Secondary | ICD-10-CM | POA: Diagnosis not present

## 2015-05-23 DIAGNOSIS — M65352 Trigger finger, left little finger: Secondary | ICD-10-CM | POA: Diagnosis not present

## 2015-05-27 DIAGNOSIS — F331 Major depressive disorder, recurrent, moderate: Secondary | ICD-10-CM | POA: Diagnosis not present

## 2015-05-27 MED FILL — TELMISARTAN 40 MG TABLET: 40 | 90 days supply | Qty: 90 | Fill #1

## 2015-05-27 MED FILL — SYNTHROID 25 MCG TABLET: 25 | 90 days supply | Qty: 90 | Fill #1

## 2015-06-25 ENCOUNTER — Encounter: Payer: 59 | Admitting: Internal Medicine

## 2015-06-28 ENCOUNTER — Other Ambulatory Visit: Payer: Self-pay | Admitting: *Deleted

## 2015-06-28 DIAGNOSIS — Z794 Long term (current) use of insulin: Principal | ICD-10-CM

## 2015-06-28 DIAGNOSIS — E1110 Type 2 diabetes mellitus with ketoacidosis without coma: Secondary | ICD-10-CM

## 2015-07-02 DIAGNOSIS — Z794 Long term (current) use of insulin: Secondary | ICD-10-CM | POA: Diagnosis not present

## 2015-07-02 DIAGNOSIS — E1310 Other specified diabetes mellitus with ketoacidosis without coma: Secondary | ICD-10-CM | POA: Diagnosis not present

## 2015-07-02 LAB — COMPREHENSIVE METABOLIC PANEL
ALBUMIN: 4.3 g/dL (ref 3.6–5.1)
ALK PHOS: 76 U/L (ref 33–130)
ALT: 16 U/L (ref 6–29)
AST: 18 U/L (ref 10–35)
BILIRUBIN TOTAL: 0.4 mg/dL (ref 0.2–1.2)
BUN: 15 mg/dL (ref 7–25)
CALCIUM: 9.2 mg/dL (ref 8.6–10.4)
CO2: 21 mmol/L (ref 20–31)
Chloride: 102 mmol/L (ref 98–110)
Creat: 1.07 mg/dL — ABNORMAL HIGH (ref 0.50–1.05)
GLUCOSE: 139 mg/dL — AB (ref 70–99)
Potassium: 4.7 mmol/L (ref 3.5–5.3)
Sodium: 138 mmol/L (ref 135–146)
TOTAL PROTEIN: 6.4 g/dL (ref 6.1–8.1)

## 2015-07-02 LAB — CBC WITH DIFFERENTIAL/PLATELET
BASOS PCT: 1 %
Basophils Absolute: 58 cells/uL (ref 0–200)
EOS ABS: 174 {cells}/uL (ref 15–500)
Eosinophils Relative: 3 %
HCT: 38.7 % (ref 35.0–45.0)
HEMOGLOBIN: 13 g/dL (ref 11.7–15.5)
LYMPHS ABS: 1914 {cells}/uL (ref 850–3900)
Lymphocytes Relative: 33 %
MCH: 28.2 pg (ref 27.0–33.0)
MCHC: 33.6 g/dL (ref 32.0–36.0)
MCV: 83.9 fL (ref 80.0–100.0)
MONO ABS: 464 {cells}/uL (ref 200–950)
MPV: 9.2 fL (ref 7.5–12.5)
Monocytes Relative: 8 %
Neutro Abs: 3190 cells/uL (ref 1500–7800)
Neutrophils Relative %: 55 %
Platelets: 343 10*3/uL (ref 140–400)
RBC: 4.61 MIL/uL (ref 3.80–5.10)
RDW: 14.2 % (ref 11.0–15.0)
WBC: 5.8 10*3/uL (ref 3.8–10.8)

## 2015-07-02 LAB — T4, FREE: Free T4: 1.2 ng/dL (ref 0.8–1.8)

## 2015-07-02 LAB — T3, FREE: T3, Free: 2.3 pg/mL (ref 2.3–4.2)

## 2015-07-02 LAB — LIPID PANEL
CHOLESTEROL: 174 mg/dL (ref 125–200)
HDL: 74 mg/dL (ref >=46)
LDL Cholesterol: 82 mg/dL (ref <130)
Total CHOL/HDL Ratio: 2.4 Ratio (ref <=5.0)
Triglycerides: 91 mg/dL (ref <150)
VLDL: 18 mg/dL (ref <30)

## 2015-07-02 LAB — HEMOGLOBIN A1C
Hgb A1c MFr Bld: 8.9 % — ABNORMAL HIGH (ref <5.7)
MEAN PLASMA GLUCOSE: 209 mg/dL

## 2015-07-02 LAB — VITAMIN D 25 HYDROXY (VIT D DEFICIENCY, FRACTURES): VIT D 25 HYDROXY: 33 ng/mL (ref 30–100)

## 2015-07-02 LAB — TSH: TSH: 2.43 m[IU]/L

## 2015-07-02 LAB — CALCIUM: Calcium: 9.2 mg/dL (ref 8.6–10.4)

## 2015-07-03 LAB — PTH, INTACT AND CALCIUM
Calcium: 9.2 mg/dL (ref 8.4–10.5)
PTH: 107 pg/mL — ABNORMAL HIGH (ref 14–64)

## 2015-07-08 ENCOUNTER — Ambulatory Visit (INDEPENDENT_AMBULATORY_CARE_PROVIDER_SITE_OTHER): Payer: 59 | Admitting: Internal Medicine

## 2015-07-08 ENCOUNTER — Encounter: Payer: Self-pay | Admitting: "Endocrinology

## 2015-07-08 ENCOUNTER — Encounter: Payer: Self-pay | Admitting: Internal Medicine

## 2015-07-08 ENCOUNTER — Ambulatory Visit (INDEPENDENT_AMBULATORY_CARE_PROVIDER_SITE_OTHER): Payer: 59 | Admitting: "Endocrinology

## 2015-07-08 VITALS — BP 128/90 | HR 90 | Wt 213.8 lb

## 2015-07-08 VITALS — BP 112/70 | HR 84 | Temp 98.9°F | Resp 18 | Ht 64.0 in | Wt 214.0 lb

## 2015-07-08 DIAGNOSIS — N951 Menopausal and female climacteric states: Secondary | ICD-10-CM

## 2015-07-08 DIAGNOSIS — E039 Hypothyroidism, unspecified: Secondary | ICD-10-CM | POA: Diagnosis not present

## 2015-07-08 DIAGNOSIS — K5904 Chronic idiopathic constipation: Secondary | ICD-10-CM | POA: Diagnosis not present

## 2015-07-08 DIAGNOSIS — E669 Obesity, unspecified: Secondary | ICD-10-CM | POA: Diagnosis not present

## 2015-07-08 DIAGNOSIS — E1143 Type 2 diabetes mellitus with diabetic autonomic (poly)neuropathy: Secondary | ICD-10-CM | POA: Diagnosis not present

## 2015-07-08 DIAGNOSIS — Z9889 Other specified postprocedural states: Secondary | ICD-10-CM | POA: Diagnosis not present

## 2015-07-08 DIAGNOSIS — M6530 Trigger finger, unspecified finger: Secondary | ICD-10-CM

## 2015-07-08 DIAGNOSIS — Z9884 Bariatric surgery status: Secondary | ICD-10-CM

## 2015-07-08 DIAGNOSIS — Z862 Personal history of diseases of the blood and blood-forming organs and certain disorders involving the immune mechanism: Secondary | ICD-10-CM

## 2015-07-08 DIAGNOSIS — E785 Hyperlipidemia, unspecified: Secondary | ICD-10-CM | POA: Diagnosis not present

## 2015-07-08 DIAGNOSIS — R000 Tachycardia, unspecified: Secondary | ICD-10-CM

## 2015-07-08 DIAGNOSIS — Z0000 Encounter for general adult medical examination without abnormal findings: Secondary | ICD-10-CM | POA: Diagnosis not present

## 2015-07-08 DIAGNOSIS — Z8739 Personal history of other diseases of the musculoskeletal system and connective tissue: Secondary | ICD-10-CM

## 2015-07-08 DIAGNOSIS — Z62810 Personal history of physical and sexual abuse in childhood: Secondary | ICD-10-CM

## 2015-07-08 DIAGNOSIS — F32A Depression, unspecified: Secondary | ICD-10-CM

## 2015-07-08 DIAGNOSIS — R4584 Anhedonia: Secondary | ICD-10-CM

## 2015-07-08 DIAGNOSIS — E119 Type 2 diabetes mellitus without complications: Secondary | ICD-10-CM

## 2015-07-08 DIAGNOSIS — F329 Major depressive disorder, single episode, unspecified: Secondary | ICD-10-CM

## 2015-07-08 DIAGNOSIS — Z9119 Patient's noncompliance with other medical treatment and regimen: Secondary | ICD-10-CM

## 2015-07-08 DIAGNOSIS — N2581 Secondary hyperparathyroidism of renal origin: Secondary | ICD-10-CM

## 2015-07-08 DIAGNOSIS — E211 Secondary hyperparathyroidism, not elsewhere classified: Secondary | ICD-10-CM

## 2015-07-08 DIAGNOSIS — E11649 Type 2 diabetes mellitus with hypoglycemia without coma: Secondary | ICD-10-CM

## 2015-07-08 DIAGNOSIS — K219 Gastro-esophageal reflux disease without esophagitis: Secondary | ICD-10-CM

## 2015-07-08 DIAGNOSIS — I10 Essential (primary) hypertension: Secondary | ICD-10-CM

## 2015-07-08 DIAGNOSIS — F331 Major depressive disorder, recurrent, moderate: Secondary | ICD-10-CM | POA: Diagnosis not present

## 2015-07-08 DIAGNOSIS — E559 Vitamin D deficiency, unspecified: Secondary | ICD-10-CM

## 2015-07-08 DIAGNOSIS — Z23 Encounter for immunization: Secondary | ICD-10-CM | POA: Diagnosis not present

## 2015-07-08 DIAGNOSIS — Z91199 Patient's noncompliance with other medical treatment and regimen due to unspecified reason: Secondary | ICD-10-CM | POA: Insufficient documentation

## 2015-07-08 MED ORDER — HYDROCODONE-ACETAMINOPHEN 5-325 MG PO TABS: 1.0000 | ORAL_TABLET | Freq: Four times a day (QID) | ORAL | Status: DC | PRN

## 2015-07-08 MED ORDER — ESTROGENS, CONJUGATED 0.625 MG/GM VA CREA: 1.0000 | TOPICAL_CREAM | Freq: Every day | VAGINAL | Status: DC

## 2015-07-08 MED ORDER — CYCLOBENZAPRINE HCL 10 MG PO TABS: ORAL_TABLET | ORAL | Status: DC

## 2015-07-08 MED ORDER — ALBUTEROL SULFATE HFA 108 (90 BASE) MCG/ACT IN AERS: 2.0000 | INHALATION_SPRAY | Freq: Four times a day (QID) | RESPIRATORY_TRACT | Status: DC | PRN

## 2015-07-08 MED FILL — PROAIR HFA 90 MCG INHALER: 108 (90 BAS | 25 days supply | Qty: 9 | Fill #0

## 2015-07-08 MED FILL — PREMARIN VAGINAL CREAM-APPL: 0.625 | 30 days supply | Qty: 30 | Fill #0

## 2015-07-08 MED FILL — HYDROCODON-APAP 5-325: 5-325 | 15 days supply | Qty: 60 | Fill #0

## 2015-07-08 MED FILL — CYCLOBENZAPRINE 10 MG TAB: 10 | 30 days supply | Qty: 30 | Fill #0

## 2015-07-08 NOTE — Patient Instructions (Signed)
Follow up visit in 2 months. Please take your Victoza every evening.

## 2015-07-08 NOTE — Progress Notes (Signed)
Subjective:  Patient Name: Alexa [name] Date of Birth: 06-06-1960  MRN: [id number]  Alexa [name]  presents to the office today for follow-up of her type 2 diabetes mellitus, obesity, combined hyperlipidemia, GERD, hypertension, ASHD, dyspepsia, pedal edema, non-proliferative diabetic retinopathy, goiter, depression, autonomic neuropathy, tachycardia, peripheral neuropathy, vitamin D deficiency, hypocalcemia, secondary hyperparathyroidism, glossitis, pallor, fatigue, iron deficiency anemia, anhedonia, disinclination to take medicines, and status post Roux-en-Y gastric bypass.   HISTORY OF PRESENT ILLNESS:   Alexa [name] is a 55 y.o. Caucasian woman. Alexa [name] was unaccompanied.  1. The patient first presented to me on 08/20/04 in referral from her primary care internist, Dr. Emeline General, for evaluation and management of type 2 diabetes, obesity, and multiple medical issues. She was 55 years old.   [name] had a long history of obesity. She was heavy as a child. She underwent menarche at age 55. At age 10 she was 180 pounds. In 1996 she weighed 218 pounds. In 1999 she was diagnosed with type 2 diabetes mellitus. Her weight at that point was 250 pounds. She was treated with Glucophage, and Actos. Actos made her gain even more weight. She had also been treated with glipizide in the past. More recently she had been treated with Lantus and Glucophage plus regular insulin as needed, especially for when she took steroids for asthma attacks. Maximum weight had been 296 pounds one month prior to that first visit with me. Her tendency to gain weight and her difficulty in losing weight were aggravated by long-standing, intermittent depression and by severe, recurrent asthma requiring the use of steroid medications.  B. Past medical history was also positive for a 60% blockage in one of her coronary arteries. She had significant issues with GERD and dyspepsia. She also had combined hyperlipidemia. She had a previous  cardiac catheterization and previous tonsillectomy. She was allergic to theophylline. Her pertinent review of systems was positive for some numbness and tingling in her feet. Family history was positive for type 2 diabetes in her father and her maternal grandmother. Her brother weighed 250 pounds at a height of 76 inches. Both her mother and her sister were hypothyroid.  C. On physical examination, her weight was 279.9 pounds. Her BMI was 49.6. Her blood pressure was 142/86. Her heart rate was 96. Her hemoglobin A1c was 9.8%. She was alert and oriented x3. Her affect was normal. Her insight was fair. She was obviously quite obese. She had a 25 gm thyroid gland. She had 1+ tremor of her hands. She had 1+ DP pulses and 2+ tinea pedis. Sensation to touch was intact in her feet. Laboratory data included a normal CMP. Her cholesterol was 175, triglycerides 76, HDL 53, and LDL 107. Her TSH was 2.98. Since she obviously did have type 2 diabetes mellitus associated with morbid obesity, and since weight loss was a major factor for her, I asked her to resume her metformin twice daily. I also started her on Byetta, initially 5 mcg twice daily and then 10 mcg twice daily. I discontinued her Lantus insulin.  2. During the last 11 years,we have had some major successes and some new problem areas.  A. T2DM: In October 2007 the combination of Byetta and metformin was causing more gastrointestinal problems. Patient opted to stop the metformin and continue the Byetta because it was helping her with weight control and blood sugar control. By 08/11/06 her weight had decreased to 267.6 pounds. Hemoglobin A1c was 8.6%. At that point she decided to have  bariatric surgery. She had a Roux-en-Y gastric bypass on 12/20/2006. She subsequently lost weight down to 173.4 pounds on 05/23/08, but then subsequently regained weight to the 190s. Her  hemoglobin A1c values varied in parallel with her weight. On 05/23/08, at the point of her lowest  weight, her hemoglobin A1c dropped to a nadir of 6.3%.  Since then her hemoglobin A1c values have varied between 6.6 and 10.3%. Although we were initially able to stop all of her diabetes medicines after bariatric surgery, when she began to regain weight we restarted metformin, 500 mg twice daily. Although she took her metformin twice daily for a long time, she had discontinued it many months ago. She hated to take medicines and could not make herself be compliant in taking medications or exercising. When her HbA1c increased to 9.4% in September 2015, I stopped her Byetta injections and started her on liraglutide (Victoza) daily injections on 11/16/13.  B. Vitamin D deficiency, hypocalcemia, and secondary hyperparathyroidism: Just before her gastric bypass, I obtained baseline bone mineral metabolism studies. Her 25-hydroxy vitamin D was very low at 7 (normal greater than or equal to 30). Her calcium was 9.5 (normal 8.6-10.6). Her parathyroid hormone was 38.4 (normal 14-72). Subsequent to her surgery, the patient was supposed to be taking multivitamins with calcium and vitamin D, but did not always do so. On 03/29/2007 her 25-hydroxy vitamin D was 29, but her calcium decreased to 8.7. Her PTH was slightly elevated at 73.5. Her 1,25-hydroxy vitamin D was 46. Her iron was 56. I asked her to make sure she took her multivitamins and calcium daily. Unfortunately, she has often been non-compliant with her multivitamins and calcium. Her 25-hydroxy vitamin D values have varied between 15-35. Her calcium values have varied between 8.1-9.4. Her PTH values have remained elevated between 79-167. The patient's iron levels have also been low, between 32-34. She is supposed to be taking iron every day as well. Unfortunately, in August of 2014 she discontinued all vitamins and supplements.   C. Fortunately, her recent outpatient psychological therapy with Ms. Rea College, RN, MS has resulted in Alexa [name] having a much more  positive attitude about life in general and a more positive attitude about taking her medications.  3. The patient's last [facility] visit was on 02/26/15.   A. In the interim she has been fairly healthy. She has not been doing much walking.   B. She had stopped taking her evening medications again some time after her last visit, but has recently re-started them. She was also drinking "lots of regular coke", but stopped that as well. She will resume swimming soon. She has been consuming more carbs and less protein. She is still has problems with work stress and other issues, but she has less anxiety. She is sleeping well at night. She continues to see Ms. [employer]. She now says that she wants to take better care of herself.  She also finds it very difficult to drink much fluid.   C. She is taking her morning medications, to include her Lexapro and Synthroid. She misses many Victoza doses at night. She often missed her other evening meds, to include Protonix, metformin, iron, calcium, and MVI, but has been taking 1000 mg of metformin, twice daily for about one month.   D. In the past few months she has noted some drawing in of her both thumbs and the left 4th and 5th fingers. She saw Dr. Hulda Humphrey who injected her left ring finger. The injection helped.  PAST MEDICAL, FAMILY, AND SOCIAL HISTORY:  Past Medical History  Diagnosis Date  . Diabetes mellitus   . Asthma   . Hypertension   . GERD (gastroesophageal reflux disease)   . CAD (coronary artery disease)   . Elevated homocysteine ([place])   . Chronic insomnia   . Diabetes mellitus type II   . Obesity, Class III, BMI 40-49.9 (morbid obesity) ([place])   . Combined hyperlipidemia   . Asthma, chronic   . GERD (gastroesophageal reflux disease)   . Dyspepsia   . Nonproliferative diabetic retinopathy associated with type 2 diabetes mellitus ([place])     RESOLVED  . Goiter   . Depression   . Diabetic autonomic neuropathy ([place])   . DM type 2 with diabetic  peripheral neuropathy ([place])   . Vitamin D deficiency   . Hyperparathyroidism , secondary, non-renal ([place])   . Tachycardia   . Glossitis   . Thyroiditis, autoimmune   . Fatigue   . Pallor   . Anemia, iron deficiency   . Essential tremor     Family History  Problem Relation Age of Onset  . Thyroid disease Mother     Hypothyroid  . Diabetes Father     T2 DM  . Obesity Brother     250 pounds at a height of 76 inches.  . Thyroid disease Sister     Hypothyroid  . Diabetes Maternal Grandmother   . Colon cancer Other     great grandfather  . Esophageal cancer Neg Hx   . Pancreatic cancer Neg Hx   . Kidney disease Neg Hx   . Liver disease Maternal Aunt     NASH     Current outpatient prescriptions:  .  albuterol (PROVENTIL HFA;VENTOLIN HFA) 108 (90 Base) MCG/ACT inhaler, Inhale 2 puffs into the lungs every 6 (six) hours as needed for wheezing or shortness of breath., Disp: 1 Inhaler, Rfl: 11 .  Calcium Carbonate-Vitamin D (CALCIUM-VITAMIN D) 500-200 MG-UNIT per tablet, Take 1 tablet by mouth 2 (two) times daily with a meal. Reported on 02/26/2015, Disp: , Rfl:  .  conjugated estrogens (PREMARIN) vaginal cream, Place 1 Applicatorful vaginally daily., Disp: 42.5 g, Rfl: 11 .  cyclobenzaprine (FLEXERIL) 10 MG tablet, 1 tab PRN, Disp: 30 tablet, Rfl: 11 .  escitalopram (LEXAPRO) 20 MG tablet, , Disp: , Rfl: 3 .  HYDROcodone-acetaminophen (NORCO/VICODIN) 5-325 MG tablet, Take 1 tablet by mouth every 6 (six) hours as needed for moderate pain., Disp: 60 tablet, Rfl: 0 .  levothyroxine (SYNTHROID, LEVOTHROID) 25 MCG tablet, Take 25 mcg by mouth daily before breakfast., Disp: , Rfl:  .  metFORMIN (GLUCOPHAGE) 500 MG tablet, TAKE 1 TABLET BY MOUTH AT BREAKFAST AND ONE AT DINNER., Disp: 180 tablet, Rfl: 3 .  Multiple Vitamin (MULTIVITAMIN) tablet, Take 1 tablet by mouth daily. Reported on 02/26/2015, Disp: , Rfl:  .  telmisartan (MICARDIS) 40 MG tablet, TAKE 1 TABLET BY MOUTH EACH MORNING.,  Disp: 90 tablet, Rfl: 3 .  TRYPTOPHAN PO, Take by mouth. Reported on 02/26/2015, Disp: , Rfl:  .  VICTOZA 18 MG/3ML SOPN, Inject 1.8 mg daily, Disp: 12 pen, Rfl: 4  Allergies as of 07/08/2015 - Review Complete 07/08/2015  Allergen Reaction Noted  . Theophyllines Palpitations 05/13/2010    1. 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 2. Activities: She is still trying to take more time  for herself, but she is still having difficulties doing so. She has reduced her church activities. She and her husband purchased a house just off the [facility] in [place], [place], but sometimes that house demands lots of work time.   3. Smoking, alcohol, or drugs: None 4. Primary Care Provider: Dr. Tommie Ard Baxley 5. Therapist: Ms. Rea College, MS  REVIEW OF SYSTEM: There are no other significant problems involving Alexa [name]'s other body systems.   Objective:  Vital Signs:  BP 128/90 mmHg  Pulse 90  Wt 213 lb 12.8 oz (96.979 kg)  LMP 05/11/2010   Ht Readings from Last 3 Encounters:  07/08/15 5\' 4"  (1.626 m)  09/04/14 5\' 4"  (1.626 m)  06/20/14 5\' 4"  (1.626 m)   Wt Readings from Last 3 Encounters:  07/08/15 213 lb 12.8 oz (96.979 kg)  07/08/15 214 lb (97.07 kg)  02/26/15 206 lb 3.2 oz (93.532 kg)   PHYSICAL EXAM:  Constitutional:  The patient looks healthy and appears physically and emotionally well. Her affect and insight are normal. She has gained 7 pounds since her last visit, equivalent to about 130 excess calories per day.  Eyes: There is no arcus or proptosis.  Mouth: The oropharynx appears normal. The tongue appears normal. There is normal oral moisture. There is no obvious gingivitis. Neck: There are no bruits present. The thyroid gland appears normal in size. The thyroid gland is larger at approximately 21 grams in size. The right lobe is normal in size. The left lobe is  mildly enlarged. The consistency of the thyroid gland is normal. There is no thyroid tenderness to palpation. Lungs: The lungs are clear. Air movement is good. Heart: The heart rhythm and rate appear normal. Heart sounds S1 and S2 are normal. I do not appreciate any pathologic heart murmurs. Abdomen: The abdomen is quite enlarged. Bowel sounds are normal. The abdomen is soft and non-tender. There is no obviously palpable hepatomegaly, splenomegaly, or other masses.  Arms: Muscle mass appears appropriate for age.  Hands: She has a 1+ tremor. Phalangeal and metacarpophalangeal joints appear normal. Palms are normal. Legs: Muscle mass appears appropriate for age. There is no edema.  Feet: There are no significant deformities. Dorsalis pedis pulses are normal 2+ bilaterally.  Neurologic: Muscle strength is normal for age and gender  in both the upper and the lower extremities. Muscle tone appears normal. Sensation to touch is normal in the legs and feet.    LAB DATA:   Labs 07/02/15: HbA1c 8.9%; PTH 107, calcium 9.2, 25-OH vitamin D 33; CBC normal; CMP normal except for creatinine 1.07; cholesterol 174, triglycerides 91, HDL 74, LDL 82; TSH 2.43, free T4 1.2, free T3 2.3  Labs 02/19/15: PTH 99, calcium 8.7, 25-OH vitamin D 20  Labs 10/08/14: HbA1c 8.0%   Labs 08/06/14: HbA1c 7.4%  Labs 08/01/14: TSH 2.014, free T4 0.87, free T3 2.4; PTH 79 (normal 14-64), calcium 9.1; 25-OH vitamin D 35 CMP normal  Labs 05/11/14: HbA1c was 7.7%, without any low BGs; TSH 2.416, free T4 1.13, free T3 2.3  Labs 02/22/14: HbA1c 11.6%  Labs 02/13/14: Calcium 9.0, PTH 131, 25-OH vitamin D 15; CMP normal except for glucose of 165; cholesterol 177, triglycerides 108, HDL 90, LDL 65; urinary microalbumin/creatinine ratio 15.6; TSH 3.639, free T4 1.30, free T3 2.5  Labs 10/10/13: HbA1c is 9.4%, compared with 8.1% at last visit and with 9.3% in December;   Labs 05/08/13: HbA1c 8.1%; PTH 90.2, calcium 9.5, 25-hydroxy vitamin D  46;  CMP normal except for glucose of 215; WBC 8.0, RBC 5.20, Hgb 12.5, Hct 37.8%, MCV 72.7 (normal 78-100), iron 55  Labs 02/23/13: CMP normal, except glucose 194 and alkaline phosphatase 126; microalbumin/creatinine ratio was 11.5; TSH 2.086, free T4 1.29, free T3 3.2, TPO antibody < 10; 25-hydroxy vitamin D 18; C-peptide 1.79; cholesterol 156, triglycerides 64, HDL 61, LDL 82  Labs 01/05/13: Hemoglobin A1c was 9.3%, compared with 9.3 at last visit and with 9.1% at the visit prior. All of these values were major increases from 6.6% in May 2012.    Labs 04/14/12: 25-hydroxy vitamin D 21, iron 203,  Vitamin B12 379,   Labs 11/16/11: Hgb 10.3, Hct 32.4%; CMP normal except glucose 146' cholesterol 201, triglycerides 70, HDL 66, LDL 121; vitamin D 22           Labs 06/09/10: Cholesterol was 175, triglycerides 76, HDL 62, LDL 98. TSH was 1.217. Free T4 was 1.10. Free T3 was 2.7. 25-hydroxy vitamin D was 19. PTH was 167.   Assessment and Plan:   ASSESSMENT:  1. Type 2 diabetes mellitus: She says that she essentially stopped taking her Victoza. BG control has worsened as a result. BGs also vary with her diet, her stress level, and her ability/willingness to take her medications as prescribed. She will have better BGs if she can take her Victoza daily.  2. Hypoglycemia: No subjective complaints  3. Obesity: Her weight has increased seven pounds, equivalent to an extra 130 calories per day. She needs to review her carb intake and reduce the carb intake as she can. Her Victoza had been helping her with weight loss when she took it regularly.  4-5. Autonomic neuropathy and tachycardia: The patient's autonomic neuropathy and  heart rate have worsened since her last visit.  6. Hypertension: Her BP is about the same. She frequently misses her anti-hypertensive medication. 7. Anhedonia and anxiety: She is doing better today. She needs to continue to take her medications. She also needs to call for help when she  begins to do poorly. She also needs to continue to find ways to have more joy in her life. 8-12. GERD/esophagitis, difficulty swallowing/gastroparesis: Her reflux has improved.  13-15: Hypocalcemia, vitamin D deficiency, and secondary hyperparathyroidism:   A. Due to her bariatric surgery she has more difficulty absorbing vitamin D and calcium. When she develops hypocalcemia and vitamin D deficiency, her parathyroid glands appropriated produce more PTH, resulting in secondary hyperparathyroidism. However, if she takes her calcium and vitamin D all three parameters will normalize.   B. Her vitamin D level is normal. Unfortunately, she has missed many doses of calcium, resulting in higher PTH levels.  16. Noncompliance: Since last visit her compliance with eating right, exercising, and taking Victoza has significantly decreased. 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 Dr. Renold Genta read Alexa [name] the riot act earlier today about her noncompliance. I concur heartily with Dr. Renold Genta.   PLAN:  1. Diagnostic: Repeat her 25-OH vitamin D, calcium, and PTH in 4 months.  2. Therapeutic: Resume medications as currently prescribed. Increase the Citracal D to 3 tablets, twice daily. Order Biotech, 50,000 IU per week if her current meds are not adequate. Try to fit in exercise daily.  See Ms. Showfety in follow up.   3. Patient education: She must take her medications and supplements. 4. Follow-up:  2 months Level of Service: This visit lasted in excess of 40 minutes. More than 50% of the visit was devoted to counseling.   Sherrlyn Hock, MD 07/08/2015 1:34 PM

## 2015-07-09 ENCOUNTER — Ambulatory Visit: Payer: 59 | Admitting: "Endocrinology

## 2015-07-26 DIAGNOSIS — Z62810 Personal history of physical and sexual abuse in childhood: Secondary | ICD-10-CM | POA: Insufficient documentation
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
## 2015-07-26 NOTE — Patient Instructions (Signed)
Continue to work on diet exercise and weight loss efforts. Please try to take medications regularly. Prevnar 13 given today. Return in 6 months.

## 2015-08-05 MED FILL — ESCITALOPRAM 20 MG TABLET: 20 | 90 days supply | Qty: 90 | Fill #3

## 2015-08-05 MED FILL — metFORMIN HCL 500 MG TABS: 500 | 90 days supply | Qty: 180 | Fill #1

## 2015-08-08 ENCOUNTER — Encounter: Payer: Self-pay | Admitting: Internal Medicine

## 2015-08-08 ENCOUNTER — Ambulatory Visit (INDEPENDENT_AMBULATORY_CARE_PROVIDER_SITE_OTHER): Payer: 59 | Admitting: Internal Medicine

## 2015-08-08 VITALS — BP 100/70 | HR 93 | Temp 99.1°F | Resp 16 | Wt 214.5 lb

## 2015-08-08 DIAGNOSIS — M5431 Sciatica, right side: Secondary | ICD-10-CM | POA: Diagnosis not present

## 2015-08-08 DIAGNOSIS — M5441 Lumbago with sciatica, right side: Secondary | ICD-10-CM | POA: Diagnosis not present

## 2015-08-08 DIAGNOSIS — Z8709 Personal history of other diseases of the respiratory system: Secondary | ICD-10-CM | POA: Diagnosis not present

## 2015-08-08 DIAGNOSIS — L259 Unspecified contact dermatitis, unspecified cause: Secondary | ICD-10-CM | POA: Diagnosis not present

## 2015-08-08 DIAGNOSIS — F331 Major depressive disorder, recurrent, moderate: Secondary | ICD-10-CM | POA: Diagnosis not present

## 2015-08-08 DIAGNOSIS — M545 Low back pain: Secondary | ICD-10-CM

## 2015-08-08 MED ORDER — PREDNISONE 10 MG (21) PO TBPK: 10.0000 mg | ORAL_TABLET | Freq: Every day | ORAL | Status: DC

## 2015-08-12 DIAGNOSIS — J45909 Unspecified asthma, uncomplicated: Secondary | ICD-10-CM

## 2015-08-12 DIAGNOSIS — M6281 Muscle weakness (generalized): Secondary | ICD-10-CM | POA: Diagnosis not present

## 2015-08-12 DIAGNOSIS — M5441 Lumbago with sciatica, right side: Secondary | ICD-10-CM | POA: Diagnosis not present

## 2015-08-15 DIAGNOSIS — M545 Low back pain, unspecified: Secondary | ICD-10-CM | POA: Insufficient documentation

## 2015-08-15 DIAGNOSIS — M5431 Sciatica, right side: Secondary | ICD-10-CM
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
## 2015-08-15 NOTE — Patient Instructions (Signed)
Continue Flexeril and hydrocodone/APAP as directed. Sterapred DS 10 mg 12 day dosepak. Physical therapy order given. Return July 25.

## 2015-08-20 ENCOUNTER — Encounter: Payer: Self-pay | Admitting: Internal Medicine

## 2015-08-20 ENCOUNTER — Ambulatory Visit (INDEPENDENT_AMBULATORY_CARE_PROVIDER_SITE_OTHER): Payer: 59 | Admitting: Internal Medicine

## 2015-08-20 VITALS — BP 124/84 | HR 92 | Temp 98.3°F | Ht 64.0 in | Wt 218.0 lb

## 2015-08-20 DIAGNOSIS — M5431 Sciatica, right side: Secondary | ICD-10-CM | POA: Diagnosis not present

## 2015-08-20 MED ORDER — HYDROCODONE-ACETAMINOPHEN 5-325 MG PO TABS: 1.0000 | ORAL_TABLET | Freq: Four times a day (QID) | ORAL | 0 refills | Status: DC | PRN

## 2015-08-20 NOTE — Patient Instructions (Addendum)
Hydrocodone APAP refilled. Has Flexeril on hand. Note to return to work tomorrow. Short-term disability form has been completed.

## 2015-08-20 NOTE — Progress Notes (Signed)
   Subjective:    Patient ID: Alexa [name], female    DOB: 11-27-1960, 55 y.o.   MRN: [id number]  HPI Patient is here to follow-up on recent bout of right-sided sciatica that started after a fall at her home around July 6. She's been out of work since that time. Plans to return to work tomorrow. She went to physical therapy once. They have shown her some exercises which seem to help. Patient also took a course of prednisone, Flexeril, pain medication. She's feeling much better at this point in time. She also needs short-term disability form completed which of was just done recently here.  She works as a Public house manager on the Engineer, [employer] at [employer]. She has been there since 1989.    Review of Systems No new complaints  She has a history of diabetes mellitus  She has a history of asthma    Objective:   Physical Exam  Straight leg raising is negative on the right. Muscle strength is normal.      Assessment & Plan:  Right-sided sciatica-resolved  History of diabetes mellitus  Status post bariatric surgery  History of asthma  Plan: Note prepared for her to return to work tomorrow. Short-term disability form has been completed. Return as needed. I have refilled her hydrocodone/APAP today at her request. She has Flexeril on hand.

## 2015-09-05 DIAGNOSIS — F331 Major depressive disorder, recurrent, moderate: Secondary | ICD-10-CM | POA: Diagnosis not present

## 2015-09-11 DIAGNOSIS — F331 Major depressive disorder, recurrent, moderate: Secondary | ICD-10-CM | POA: Diagnosis not present

## 2015-09-24 MED FILL — CYCLOBENZAPRINE 10 MG TAB: 10 | 30 days supply | Qty: 30 | Fill #1

## 2015-09-24 MED FILL — TELMISARTAN 40 MG TABLET: 40 | 90 days supply | Qty: 90 | Fill #2

## 2015-09-24 MED FILL — SYNTHROID 25 MCG TABLET: 25 | 90 days supply | Qty: 90 | Fill #2

## 2015-09-24 MED FILL — VICTOZA 18 MG/3 ML INJECT P: 18 | 90 days supply | Qty: 27 | Fill #1

## 2015-10-01 ENCOUNTER — Other Ambulatory Visit: Payer: Self-pay | Admitting: *Deleted

## 2015-10-01 DIAGNOSIS — R1013 Epigastric pain: Secondary | ICD-10-CM

## 2015-10-01 MED ORDER — PANTOPRAZOLE SODIUM 40 MG PO TBEC: 40.0000 mg | DELAYED_RELEASE_TABLET | Freq: Every day | ORAL | 4 refills | Status: DC

## 2015-10-01 MED FILL — PANTOPRAZOLE SOD DR 40 MG T: 40 | 90 days supply | Qty: 90 | Fill #0

## 2015-10-08 ENCOUNTER — Ambulatory Visit (INDEPENDENT_AMBULATORY_CARE_PROVIDER_SITE_OTHER): Payer: 59 | Admitting: "Endocrinology

## 2015-10-08 ENCOUNTER — Encounter: Payer: Self-pay | Admitting: "Endocrinology

## 2015-10-08 VITALS — BP 136/90 | HR 88 | Wt 211.8 lb

## 2015-10-08 DIAGNOSIS — Z23 Encounter for immunization: Secondary | ICD-10-CM

## 2015-10-08 DIAGNOSIS — I10 Essential (primary) hypertension: Secondary | ICD-10-CM

## 2015-10-08 DIAGNOSIS — E559 Vitamin D deficiency, unspecified: Secondary | ICD-10-CM | POA: Diagnosis not present

## 2015-10-08 DIAGNOSIS — E1165 Type 2 diabetes mellitus with hyperglycemia: Secondary | ICD-10-CM | POA: Diagnosis not present

## 2015-10-08 DIAGNOSIS — IMO0001 Reserved for inherently not codable concepts without codable children: Secondary | ICD-10-CM

## 2015-10-08 DIAGNOSIS — R4584 Anhedonia: Secondary | ICD-10-CM

## 2015-10-08 DIAGNOSIS — Z794 Long term (current) use of insulin: Secondary | ICD-10-CM | POA: Diagnosis not present

## 2015-10-08 DIAGNOSIS — E669 Obesity, unspecified: Secondary | ICD-10-CM

## 2015-10-08 DIAGNOSIS — E11649 Type 2 diabetes mellitus with hypoglycemia without coma: Secondary | ICD-10-CM | POA: Diagnosis not present

## 2015-10-08 DIAGNOSIS — E211 Secondary hyperparathyroidism, not elsewhere classified: Secondary | ICD-10-CM

## 2015-10-08 DIAGNOSIS — R000 Tachycardia, unspecified: Secondary | ICD-10-CM

## 2015-10-08 DIAGNOSIS — E1143 Type 2 diabetes mellitus with diabetic autonomic (poly)neuropathy: Secondary | ICD-10-CM

## 2015-10-08 DIAGNOSIS — K219 Gastro-esophageal reflux disease without esophagitis: Secondary | ICD-10-CM

## 2015-10-08 LAB — GLUCOSE, POCT (MANUAL RESULT ENTRY): POC Glucose: 192 mg/dl — AB (ref 70–99)

## 2015-10-08 LAB — POCT GLYCOSYLATED HEMOGLOBIN (HGB A1C)

## 2015-10-08 NOTE — Progress Notes (Signed)
Subjective:  Patient Name: Alexa [name] Date of Birth: [birth year]  MRN: [id number]  Alexa [name]  presents to the office today for follow-up of her type 2 diabetes mellitus, obesity, combined hyperlipidemia, GERD, hypertension, ASHD, dyspepsia, pedal edema, non-proliferative diabetic retinopathy, goiter, depression, autonomic neuropathy, tachycardia, peripheral neuropathy, vitamin D deficiency, hypocalcemia, secondary hyperparathyroidism, glossitis, pallor, fatigue, iron deficiency anemia, anhedonia, disinclination to take medicines, and status post Roux-en-Y gastric bypass.   HISTORY OF PRESENT ILLNESS:   Alexa [name] is a 55 y.o. Caucasian woman. Alexa [name] was unaccompanied.  1. The patient first presented to me on 08/20/04 in referral from her primary care internist, Dr. Emeline General, for evaluation and management of type 2 diabetes, obesity, and multiple medical issues. She was 55 years old.   Alexa [name] had a long history of obesity. She was heavy as a child. She underwent menarche at age 64. At age 22 she was 180 pounds. In 1996 she weighed 218 pounds. In 1999 she was diagnosed with type 2 diabetes mellitus. Her weight at that point was 250 pounds. She was treated with Glucophage, and Actos. Actos made her gain even more weight. She had also been treated with glipizide in the past. More recently she had been treated with Lantus and Glucophage plus regular insulin as needed, especially for when she took steroids for asthma attacks. Maximum weight had been 296 pounds one month prior to that first visit with me. Her tendency to gain weight and her difficulty in losing weight were aggravated by long-standing, intermittent depression and by severe, recurrent asthma requiring the use of steroid medications.  B. Past medical history was also positive for a 60% blockage in one of her coronary arteries. She had significant issues with GERD and dyspepsia. She also had combined hyperlipidemia. She had a previous  cardiac catheterization and previous tonsillectomy. She was allergic to theophylline. Her pertinent review of systems was positive for some numbness and tingling in her feet. Family history was positive for type 2 diabetes in her father and her maternal grandmother. Her brother weighed 250 pounds at a height of 76 inches. Both her mother and her sister were hypothyroid.  C. On physical examination, her weight was 279.9 pounds. Her BMI was 49.6. Her blood pressure was 142/86. Her heart rate was 96. Her hemoglobin A1c was 9.8%. She was alert and oriented x3. Her affect was normal. Her insight was fair. She was obviously quite obese. She had a 25 gm thyroid gland. She had 1+ tremor of her hands. She had 1+ DP pulses and 2+ tinea pedis. Sensation to touch was intact in her feet. Laboratory data included a normal CMP. Her cholesterol was 175, triglycerides 76, HDL 53, and LDL 107. Her TSH was 2.98. Since she obviously did have type 2 diabetes mellitus associated with morbid obesity, and since weight loss was a major factor for her, I asked her to resume her metformin twice daily. I also started her on Byetta, initially 5 mcg twice daily and then 10 mcg twice daily. I discontinued her Lantus insulin.  2. During the last 11 years,we have had some major successes and some new problem areas.  A. T2DM: In October 2007 the combination of Byetta and metformin was causing more gastrointestinal problems. Patient opted to stop the metformin and continue the Byetta because it was helping her with weight control and blood sugar control. By 08/11/06 her weight had decreased to 267.6 pounds. Hemoglobin A1c was 8.6%. At that point she decided to have  bariatric surgery. She had a Roux-en-Y gastric bypass on 12/20/2006. She subsequently lost weight down to 173.4 pounds on 05/23/08, but then subsequently regained weight to the 190s. Her  hemoglobin A1c values varied in parallel with her weights. On 05/23/08, at the point of her lowest  weight, her hemoglobin A1c dropped to a nadir of 6.3%.  Since then her hemoglobin A1c values have varied between 6.6 and 10.3%. Although we were initially able to stop all of her diabetes medicines after bariatric surgery, when she began to regain weight we restarted metformin, 500 mg twice daily. Although she took her metformin twice daily for a long time, she had discontinued it many months ago. She hated to take medicines and could not make herself be compliant in taking medications or exercising. When her HbA1c increased to 9.4% in September 2015, I stopped her Byetta injections and started her on liraglutide (Victoza) daily injections on 11/16/13.  B. Vitamin D deficiency, hypocalcemia, and secondary hyperparathyroidism: Just before her gastric bypass, I obtained baseline bone mineral metabolism studies. Her 25-hydroxy vitamin D was very low at 7 (normal greater than or equal to 30). Her calcium was 9.5 (normal 8.6-10.6). Her parathyroid hormone was 38.4 (normal 14-72). Subsequent to her surgery, the patient was supposed to be taking multivitamins with calcium and vitamin D, but did not always do so. On 03/29/2007 her 25-hydroxy vitamin D was 29, but her calcium decreased to 8.7. Her PTH was slightly elevated at 73.5. Her 1,25-hydroxy vitamin D was 46. Her iron was 56. I asked her to make sure she took her multivitamins and calcium daily. Unfortunately, she has often been non-compliant with her multivitamins and calcium. Her 25-hydroxy vitamin D values have varied between 15-35. Her calcium values have varied between 8.1-9.4. Her PTH values have remained elevated between 79-167. The patient's iron levels have also been low, between 32-34. She is supposed to be taking iron every day as well. Unfortunately, in August of 2014 she discontinued all vitamins and supplements.   C. The patient has ha psychological issues for many years that have adversely affected her eating, her unwillingness to exercise, her  compliance with taking medications, her weight, and her T2DM care. Fortunately, her recent outpatient psychological therapy with Ms. Rea College, RN, MS has resulted in Alexa [name] having a much more positive attitude about life in general and a generally more positive attitude about taking her medications.  3. The patient's last [facility] visit was on 07/08/15.   A. In the interim she has been having more difficulty sleeping, more night sweats, more crying, more being emotional, and low-grade nausea.   B. She is still drinking about two regular Cokes per week. She did not resume swimming and has not been walking except on the job. She is still has problems with work stress and other issues, but she has less anxiety. She continues to see Ms. [employer]. Alexa [name] will start anew psych medication soon, Contrave.  C. She is again only  taking her morning medications intermittently, to include her Lexapro and Synthroid. She takes most Victoza doses at night. She often missed her other evening meds, to include Protonix, metformin, iron, calcium, and MVI, and has been taking 1000 mg of metformin, twice daily off and on. If she takes her morning medicines she usually takes her evening medicines. She may also go without oral medicines for a week at a time.    D. She fell down the stairs at her home about 8 weeks ago and hurt  her back. She was out of work for 3-1/2 weeks.    4. Review of Systems: There are no other significant issues  Constitutional: The patient feels tired a lot.  Eyes: Vision is fairly good. There are no significant eye complaints. The last eye exam that I am aware of occurred in the Spring of 2014. I have asked her to obtain a follow up eye exam.  Neck: The patient has no complaints of anterior neck swelling, soreness, tenderness, pressure,or discomfort. Heart: Heart rate increases with exercise or other physical activity. The patient has no complaints of palpitations, irregular heat beats, chest  pain, or chest pressure. Gastrointestinal: The patient has intermittent constipation, but no new complaints of excessive hunger, acid reflux, upset stomach, stomach aches or pains, or diarrhea. Legs: Muscle mass and strength seem normal. Tinea improves when she uses her anti-fungal cream. There are no new complaints of numbness, tingling, burning, or pain. No edema is noted. Feet: There are no obvious foot problems. There are no new complaints of numbness, tingling, burning, or pain. No edema is noted. Neuro: No new sensory or muscular problems Psych: She has been more variable in terms of emotionality.   Hypoglycemia: None  5. BG printout: She did not bring in her BG meter today. She has not been checking her BGs.   PAST MEDICAL, FAMILY, AND SOCIAL HISTORY:  Past Medical History:  Diagnosis Date  . Anemia, iron deficiency   . Asthma   . Asthma, chronic   . CAD (coronary artery disease)   . Chronic insomnia   . Combined hyperlipidemia   . Depression   . Diabetes mellitus   . Diabetes mellitus type II   . Diabetic autonomic neuropathy ([place])   . DM type 2 with diabetic peripheral neuropathy ([place])   . Dyspepsia   . 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 Essential tremor   . Fatigue   . GERD (gastroesophageal reflux disease)   . GERD (gastroesophageal reflux disease)   . Glossitis   . Goiter   . Hyperparathyroidism , secondary, non-renal ([place])   . Hypertension   . Nonproliferative diabetic retinopathy associated with type 2 diabetes mellitus ([place])    RESOLVED  . Obesity, Class III, BMI 40-49.9 (morbid obesity) ([place])   . Pallor   . Tachycardia   . Thyroiditis, autoimmune   . Vitamin D deficiency     Family History  Problem Relation Age of Onset  . Thyroid disease Mother     Hypothyroid  . Diabetes Father     T2 DM  . Obesity Brother     250 pounds at a height of 76 inches.  . Thyroid disease Sister     Hypothyroid  . Diabetes Maternal Grandmother   . Colon cancer Other      great grandfather  . Esophageal cancer Neg Hx   . Pancreatic cancer Neg Hx   . Kidney disease Neg Hx   . Liver disease Maternal Aunt     NASH     Current Outpatient Prescriptions:  .  albuterol (PROVENTIL HFA;VENTOLIN HFA) 108 (90 Base) MCG/ACT inhaler, Inhale 2 puffs into the lungs every 6 (six) hours as needed for wheezing or shortness of breath., Disp: 1 Inhaler, Rfl: 11 .  Calcium Carbonate-Vitamin D (CALCIUM-VITAMIN D) 500-200 MG-UNIT per tablet, Take 1 tablet by mouth 2 (two) times daily with a meal. Reported on 02/26/2015, Disp: , Rfl:  .  conjugated estrogens (PREMARIN) vaginal cream, Place 1 Applicatorful vaginally daily., Disp: 42.5 g,  Rfl: 11 .  cyclobenzaprine (FLEXERIL) 10 MG tablet, 1 tab PRN, Disp: 30 tablet, Rfl: 11 .  escitalopram (LEXAPRO) 20 MG tablet, , Disp: , Rfl: 3 .  HYDROcodone-acetaminophen (NORCO/VICODIN) 5-325 MG tablet, Take 1 tablet by mouth every 6 (six) hours as needed for moderate pain., Disp: 60 tablet, Rfl: 0 .  levothyroxine (SYNTHROID, LEVOTHROID) 25 MCG tablet, Take 25 mcg by mouth daily before breakfast., Disp: , Rfl:  .  metFORMIN (GLUCOPHAGE) 500 MG tablet, TAKE 1 TABLET BY MOUTH AT BREAKFAST AND ONE AT DINNER. (Patient taking differently: 1 tab BID), Disp: 180 tablet, Rfl: 3 .  Multiple Vitamin (MULTIVITAMIN) tablet, Take 1 tablet by mouth daily. Reported on 02/26/2015, Disp: , Rfl:  .  pantoprazole (PROTONIX) 40 MG tablet, Take 1 tablet (40 mg total) by mouth daily., Disp: 90 tablet, Rfl: 4 .  telmisartan (MICARDIS) 40 MG tablet, TAKE 1 TABLET BY MOUTH EACH MORNING., Disp: 90 tablet, Rfl: 3 .  TRYPTOPHAN PO, Take by mouth as needed. Reported on 02/26/2015, Disp: , Rfl:  .  VICTOZA 18 MG/3ML SOPN, Inject 1.8 mg daily, Disp: 12 pen, Rfl: 4  Allergies as of 10/08/2015 - Review Complete 08/20/2015  Allergen Reaction Noted  . Theophyllines Palpitations 05/13/2010    1. 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 2. Activities: She is still trying to take more time for herself, but it has been difficult to do so. She has reduced her church activities. She and her husband purchased a house just off the [facility] in [place], [place], which they enjoy, but which also required a lot of work.   3. Smoking, alcohol, or drugs: None 4. Primary Care Provider: Dr. Tommie Ard Baxley 5. Therapist: Ms. Rea College, MS  REVIEW OF SYSTEM: There are no other significant problems involving Layal's other body systems.   Objective:  Vital Signs:  BP 136/90   Pulse 88   Wt 211 lb 12.8 oz (96.1 kg)   LMP 05/11/2010   BMI 36.36 kg/m    Ht Readings from Last 3 Encounters:  08/20/15 5\' 4"  (1.626 m)  07/08/15 5\' 4"  (1.626 m)  09/04/14 5\' 4"  (1.626 m)   Wt Readings from Last 3 Encounters:  10/08/15 211 lb 12.8 oz (96.1 kg)  08/20/15 218 lb (98.9 kg)  08/08/15 214 lb 8 oz (97.3 kg)   PHYSICAL EXAM:  Constitutional:  The patient looks healthy, but appears somewhat physically tired today. Her affect is a bit flat. Her insight is normal. She has lost 2 pounds since her last visit.  Eyes: There is no arcus or proptosis.  Mouth: The oropharynx appears normal. The tongue appears normal. There is normal oral moisture. There is no obvious gingivitis. Neck: There are no bruits present. The thyroid gland appears normal in size. The thyroid gland is again enlarged at approximately 21 grams in size. The right lobe is normal in size. The left lobe is mildly enlarged. The consistency of the thyroid gland is normal. There is no thyroid tenderness to palpation. Lungs: The lungs are clear. Air movement is good. Heart: The heart rhythm and rate appear normal. Heart sounds S1 and S2 are normal. I do not appreciate any pathologic heart murmurs. Abdomen: The abdomen is quite enlarged. Bowel sounds are normal. The abdomen is soft and non-tender. There is no  obviously palpable hepatomegaly, splenomegaly, or other masses.  Arms: Muscle mass appears appropriate for age.  Hands: She has a 2+ tremor. Phalangeal and metacarpophalangeal joints appear normal. Palms are normal. Legs: Muscle mass appears appropriate for age. There is no edema.  Feet: There are no significant deformities. Dorsalis pedis pulses are normal 2+ bilaterally.  Neurologic: Muscle strength is normal for age and gender  in both the upper and the lower extremities. Muscle tone appears normal. Sensation to touch is normal in the legs and feet.    LAB DATA:  Labs 10/08/15: HbA1c 9.6%  Labs 07/02/15: HbA1c 8.9%; PTH 107, calcium 9.2, 25-OH vitamin D 33; CBC normal; CMP normal except for creatinine 1.07; cholesterol 174, triglycerides 91, HDL 74, LDL 82; TSH 2.43, free T4 1.2, free T3 2.3  Labs 02/19/15: PTH 99, calcium 8.7, 25-OH vitamin D 20  Labs 10/08/14: HbA1c 8.0%   Labs 08/06/14: HbA1c 7.4%  Labs 08/01/14: TSH 2.014, free T4 0.87, free T3 2.4; PTH 79 (normal 14-64), calcium 9.1; 25-OH vitamin D 35 CMP normal  Labs 05/11/14: HbA1c was 7.7%, without any low BGs; TSH 2.416, free T4 1.13, free T3 2.3  Labs 02/22/14: HbA1c 11.6%  Labs 02/13/14: Calcium 9.0, PTH 131, 25-OH vitamin D 15; CMP normal except for glucose of 165; cholesterol 177, triglycerides 108, HDL 90, LDL 65; urinary microalbumin/creatinine ratio 15.6; TSH 3.639, free T4 1.30, free T3 2.5  Labs 10/10/13: HbA1c is 9.4%, compared with 8.1% at last visit and with 9.3% in December;   Labs 05/08/13: HbA1c 8.1%; PTH 90.2, calcium 9.5, 25-hydroxy vitamin D 46; CMP normal except for glucose of 215; WBC 8.0, RBC 5.20, Hgb 12.5, Hct 37.8%, MCV 72.7 (normal 78-100), iron 55  Labs 02/23/13: CMP normal, except glucose 194 and alkaline phosphatase 126; microalbumin/creatinine ratio was 11.5; TSH 2.086, free T4 1.29, free T3 3.2, TPO antibody < 10; 25-hydroxy vitamin D 18; C-peptide 1.79; cholesterol 156, triglycerides 64, HDL 61, LDL  82  Labs 01/05/13: Hemoglobin A1c was 9.3%, compared with 9.3 at last visit and with 9.1% at the visit prior. All of these values were major increases from 6.6% in May 2012.    Labs 04/14/12: 25-hydroxy vitamin D 21, iron 203,  Vitamin B12 379,   Labs 11/16/11: Hgb 10.3, Hct 32.4%; CMP normal except glucose 146' cholesterol 201, triglycerides 70, HDL 66, LDL 121; vitamin D 22           Labs 06/09/10: Cholesterol was 175, triglycerides 76, HDL 62, LDL 98. TSH was 1.217. Free T4 was 1.10. Free T3 was 2.7. 25-hydroxy vitamin D was 19. PTH was 167.   Assessment and Plan:   ASSESSMENT:  1. Type 2 diabetes mellitus:   A. She says that she has taken Victoza most of the time, but has missed at least 25% of her oral meds. She also admits to stopping the oral meds and Victoza for up to one week at times. At times she does not leave the meds in an easily visualized place. At other times she consciously decides not to take meds. Yet at other times, she takes meds twice daily.   B. Her increase in HbA1c from 8.9% to 9.6% indicates that her DM control has deteriorated in the past 3 months.  2. Hypoglycemia: No subjective complaints  3. Obesity: Her weight has decreased two pounds. She needs to review her carb intake and reduce the carb intake as much as she can. Her Victoza had been helping her with weight loss when she took it regularly.  4-5. Autonomic neuropathy  and tachycardia: The patient's autonomic neuropathy and  heart rate improved as a result of her lower HbA1c at her last visit. Unless she takes better control of her DM, however, the neuropathy and heart rate will worsen by the time of her next visit.  6. Hypertension: Her BP is about the same, still too high. She frequently misses her anti-hypertensive medication. 7-8. Anhedonia and anxiety: She is doing somewhat better today. She needs to continue to take her medications. She also needs to call for help when she begins to do poorly. She also needs  to continue to find ways to have more joy in her life. 9-12. GERD/esophagitis/difficulty swallowing/gastroparesis: Her reflux and other GI symptoms have improved.  13-15: Hypocalcemia, vitamin D deficiency, and secondary hyperparathyroidism:   A. Due to her bariatric surgery she has more difficulty absorbing vitamin D and calcium. When she develops hypocalcemia and vitamin D deficiency, her parathyroid glands appropriated produce more PTH, resulting in secondary hyperparathyroidism. However, if she takes her calcium and vitamin D all three parameters will normalize.   B. Her vitamin D level was normal at her last visit. Unfortunately, she missed too many doses of calcium, resulting in higher PTH levels.  16. Noncompliance: Since last visit her compliance with eating right, exercising, and taking Victoza has decreased more. 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   PLAN:  1. Diagnostic: Repeat her CMP now to check up on her creatinine.   2. Therapeutic: Resume medications as currently prescribed. Increase the Citracal D to 3 tablets, twice daily. Order Biotech, 50,000 IU per week if her current meds are not adequate. Try to fit in exercise daily.  See Ms. Showfety in follow up.   3. Patient education: She must take her medications and supplements. 4. Follow-up: 2 months  Level of Service: This visit lasted in excess of 40 minutes. More than 50% of the visit was devoted to counseling.   Sherrlyn Hock, MD 10/08/2015 2:26 PM

## 2015-10-08 NOTE — Patient Instructions (Signed)
Follow up visit in 2 months. Please take your medicines.

## 2015-10-09 LAB — COMPREHENSIVE METABOLIC PANEL
ALT: 25 U/L (ref 6–29)
AST: 20 U/L (ref 10–35)
Albumin: 4 g/dL (ref 3.6–5.1)
Alkaline Phosphatase: 78 U/L (ref 33–130)
BUN: 15 mg/dL (ref 7–25)
CHLORIDE: 104 mmol/L (ref 98–110)
CO2: 23 mmol/L (ref 20–31)
CREATININE: 0.85 mg/dL (ref 0.50–1.05)
Calcium: 8.8 mg/dL (ref 8.6–10.4)
Glucose, Bld: 152 mg/dL — ABNORMAL HIGH (ref 70–99)
Potassium: 4.6 mmol/L (ref 3.5–5.3)
SODIUM: 136 mmol/L (ref 135–146)
TOTAL PROTEIN: 6.7 g/dL (ref 6.1–8.1)
Total Bilirubin: 0.5 mg/dL (ref 0.2–1.2)

## 2015-10-19 ENCOUNTER — Encounter (HOSPITAL_COMMUNITY): Payer: Self-pay | Admitting: Emergency Medicine

## 2015-10-19 ENCOUNTER — Emergency Department (HOSPITAL_COMMUNITY)
Admission: EM | Admit: 2015-10-19 | Discharge: 2015-10-19 | Disposition: A | Discharge status: Home / Self Care | Payer: 59 | Attending: Emergency Medicine | Admitting: Emergency Medicine

## 2015-10-19 ENCOUNTER — Emergency Department (HOSPITAL_COMMUNITY): Payer: 59

## 2015-10-19 DIAGNOSIS — N390 Urinary tract infection, site not specified: Secondary | ICD-10-CM

## 2015-10-19 DIAGNOSIS — J069 Acute upper respiratory infection, unspecified: Secondary | ICD-10-CM | POA: Diagnosis not present

## 2015-10-19 DIAGNOSIS — J45909 Unspecified asthma, uncomplicated: Secondary | ICD-10-CM | POA: Insufficient documentation

## 2015-10-19 DIAGNOSIS — B349 Viral infection, unspecified: Secondary | ICD-10-CM | POA: Diagnosis not present

## 2015-10-19 DIAGNOSIS — R0981 Nasal congestion: Secondary | ICD-10-CM | POA: Diagnosis present

## 2015-10-19 DIAGNOSIS — I2510 Atherosclerotic heart disease of native coronary artery without angina pectoris: Secondary | ICD-10-CM | POA: Insufficient documentation

## 2015-10-19 DIAGNOSIS — Z7984 Long term (current) use of oral hypoglycemic drugs: Secondary | ICD-10-CM | POA: Insufficient documentation

## 2015-10-19 DIAGNOSIS — E119 Type 2 diabetes mellitus without complications: Secondary | ICD-10-CM | POA: Diagnosis not present

## 2015-10-19 DIAGNOSIS — Z79899 Other long term (current) drug therapy: Secondary | ICD-10-CM | POA: Insufficient documentation

## 2015-10-19 DIAGNOSIS — I10 Essential (primary) hypertension: Secondary | ICD-10-CM | POA: Diagnosis not present

## 2015-10-19 DIAGNOSIS — R079 Chest pain, unspecified: Secondary | ICD-10-CM | POA: Diagnosis not present

## 2015-10-19 LAB — DIFFERENTIAL
BASOS ABS: 0 10*3/uL (ref 0.0–0.1)
BASOS PCT: 0 %
EOS ABS: 0 10*3/uL (ref 0.0–0.7)
Eosinophils Relative: 0 %
Lymphocytes Relative: 9 %
Lymphs Abs: 1.5 10*3/uL (ref 0.7–4.0)
MONOS PCT: 5 %
Monocytes Absolute: 0.8 10*3/uL (ref 0.1–1.0)
NEUTROS ABS: 14.9 10*3/uL — AB (ref 1.7–7.7)
NEUTROS PCT: 86 %

## 2015-10-19 LAB — LIPASE, BLOOD: LIPASE: 30 U/L (ref 11–51)

## 2015-10-19 LAB — COMPREHENSIVE METABOLIC PANEL
ALT: 17 U/L (ref 14–54)
ANION GAP: 9 (ref 5–15)
AST: 19 U/L (ref 15–41)
Albumin: 4.5 g/dL (ref 3.5–5.0)
Alkaline Phosphatase: 90 U/L (ref 38–126)
BUN: 13 mg/dL (ref 6–20)
CALCIUM: 9.4 mg/dL (ref 8.9–10.3)
CHLORIDE: 102 mmol/L (ref 101–111)
CO2: 23 mmol/L (ref 22–32)
Creatinine, Ser: 1.03 mg/dL — ABNORMAL HIGH (ref 0.44–1.00)
Glucose, Bld: 198 mg/dL — ABNORMAL HIGH (ref 65–99)
Potassium: 4.2 mmol/L (ref 3.5–5.1)
SODIUM: 134 mmol/L — AB (ref 135–145)
Total Bilirubin: 0.4 mg/dL (ref 0.3–1.2)
Total Protein: 7.9 g/dL (ref 6.5–8.1)

## 2015-10-19 LAB — CBC
HCT: 40.1 % (ref 36.0–46.0)
HEMOGLOBIN: 13.5 g/dL (ref 12.0–15.0)
MCH: 28.4 pg (ref 26.0–34.0)
MCHC: 33.7 g/dL (ref 30.0–36.0)
MCV: 84.4 fL (ref 78.0–100.0)
PLATELETS: 359 10*3/uL (ref 150–400)
RBC: 4.75 MIL/uL (ref 3.87–5.11)
RDW: 13.1 % (ref 11.5–15.5)
WBC: 17.3 10*3/uL — AB (ref 4.0–10.5)

## 2015-10-19 LAB — URINALYSIS, ROUTINE W REFLEX MICROSCOPIC
Bilirubin Urine: NEGATIVE
Glucose, UA: >1000 mg/dL — AB
HGB URINE DIPSTICK: NEGATIVE
Ketones, ur: 15 mg/dL — AB
LEUKOCYTES UA: NEGATIVE
Nitrite: POSITIVE — AB
PROTEIN: NEGATIVE mg/dL
SPECIFIC GRAVITY, URINE: 1.024 (ref 1.005–1.030)
pH: 5.5 (ref 5.0–8.0)

## 2015-10-19 LAB — CBG MONITORING, ED: GLUCOSE-CAPILLARY: 179 mg/dL — AB (ref 65–99)

## 2015-10-19 LAB — URINE MICROSCOPIC-ADD ON: RBC / HPF: NONE SEEN RBC/hpf (ref 0–5)

## 2015-10-19 LAB — I-STAT CG4 LACTIC ACID, ED: Lactic Acid, Venous: 1.8 mmol/L (ref 0.5–1.9)

## 2015-10-19 MED ORDER — SODIUM CHLORIDE 0.9 % IV BOLUS (SEPSIS)
1000.0000 mL | Freq: Once | INTRAVENOUS | Status: AC
  Administered 2015-10-19: 1000 mL via INTRAVENOUS

## 2015-10-19 MED ORDER — CEPHALEXIN 500 MG PO CAPS: 500.0000 mg | ORAL_CAPSULE | Freq: Three times a day (TID) | ORAL | 0 refills | Status: DC

## 2015-10-19 MED ORDER — DEXTROSE 5 % IV SOLN
1.0000 g | Freq: Once | INTRAVENOUS | Status: AC
  Administered 2015-10-19: 1 g via INTRAVENOUS
  Filled 2015-10-19: qty 10

## 2015-10-19 MED ORDER — PROMETHAZINE HCL 25 MG/ML IJ SOLN
25.0000 mg | Freq: Once | INTRAMUSCULAR | Status: AC
  Administered 2015-10-19: 25 mg via INTRAVENOUS
  Filled 2015-10-19: qty 1

## 2015-10-19 MED ORDER — PROMETHAZINE HCL 25 MG PO TABS: 25.0000 mg | ORAL_TABLET | Freq: Four times a day (QID) | ORAL | 0 refills | Status: DC | PRN

## 2015-10-19 MED ORDER — ACETAMINOPHEN 325 MG PO TABS
650.0000 mg | ORAL_TABLET | Freq: Once | ORAL | Status: AC
  Administered 2015-10-19: 650 mg via ORAL
  Filled 2015-10-19: qty 2

## 2015-10-19 MED ORDER — BENZONATATE 100 MG PO CAPS: 100.0000 mg | ORAL_CAPSULE | Freq: Three times a day (TID) | ORAL | 0 refills | Status: DC

## 2015-10-19 NOTE — ED Notes (Signed)
MD at bedside. 
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
## 2015-10-19 NOTE — ED Triage Notes (Signed)
Pt c/o Nausea starting last night with generalized body aches and pain. Pt has tried Zofran without relief. Pt is a Museum/gallery exhibitions officer. Pt sts she had her flu shot a couple of weeks ago. Pt denies abdominal pain, other than when vomiting. Pt also c/o sore throat. A&Ox4 and ambulatory.

## 2015-10-21 MED FILL — CONTRAVE ER 8-90 MG TABLET: 8-90 | 30 days supply | Qty: 120 | Fill #0

## 2015-10-22 ENCOUNTER — Telehealth: Payer: Self-pay | Admitting: Internal Medicine

## 2015-10-22 LAB — URINE CULTURE: Culture: >=100000 — AB

## 2015-10-22 NOTE — Telephone Encounter (Signed)
Left message on home machine @ [phone] for patient return the call; called to advise patient that she's currently on the right medication (Keflex 500 mg tid).  Her culture states that she's sensitive to Keflex, so she should continue to take that and complete the entire round of the antibiotic.  A copy of the urine culture was put in the mail to the patient today.

## 2015-10-23 ENCOUNTER — Telehealth (HOSPITAL_BASED_OUTPATIENT_CLINIC_OR_DEPARTMENT_OTHER): Payer: Self-pay | Admitting: Emergency Medicine

## 2015-10-23 NOTE — Telephone Encounter (Signed)
Patient called this morning; went over culture results with her and advised to continue to take her antibiotic until completion.  Patient verbalized understanding of these instructions.

## 2015-10-23 NOTE — Telephone Encounter (Signed)
Post ED Visit - Positive Culture Follow-up  Culture report reviewed by antimicrobial stewardship pharmacist:  []  Elenor Quinones, Pharm.D. []  Heide Guile, Pharm.D., BCPS []  Parks Neptune, Pharm.D. []  Alycia Rossetti, Pharm.D., BCPS []  [place], Pharm.D., BCPS, AAHIVP []  Legrand Como, Pharm.D., BCPS, AAHIVP []  Milus Glazier, Pharm.D. []  Stephens November, [place].D. Dimitri Ped PharmD  Positive urine culture Treated with cephalexin, organism sensitive to the same and no further patient follow-up is required at this time.  Hazle Nordmann 10/23/2015, 9:36 AM

## 2015-10-29 ENCOUNTER — Ambulatory Visit (INDEPENDENT_AMBULATORY_CARE_PROVIDER_SITE_OTHER): Payer: 59 | Admitting: Internal Medicine

## 2015-10-29 ENCOUNTER — Encounter: Payer: Self-pay | Admitting: Internal Medicine

## 2015-10-29 VITALS — BP 120/86 | HR 96 | Temp 98.6°F | Wt 212.0 lb

## 2015-10-29 DIAGNOSIS — Z8709 Personal history of other diseases of the respiratory system: Secondary | ICD-10-CM | POA: Diagnosis not present

## 2015-10-29 DIAGNOSIS — J069 Acute upper respiratory infection, unspecified: Secondary | ICD-10-CM | POA: Diagnosis not present

## 2015-10-29 DIAGNOSIS — N390 Urinary tract infection, site not specified: Secondary | ICD-10-CM

## 2015-10-29 LAB — POCT URINALYSIS DIPSTICK
Bilirubin, UA: NEGATIVE
Blood, UA: NEGATIVE
KETONES UA: NEGATIVE
LEUKOCYTES UA: NEGATIVE
Nitrite, UA: NEGATIVE
PH UA: 5
PROTEIN UA: NEGATIVE
Spec Grav, UA: <=1.005
Urobilinogen, UA: NEGATIVE

## 2015-10-29 MED ORDER — ALBUTEROL SULFATE (2.5 MG/3ML) 0.083% IN NEBU: 2.5000 mg | INHALATION_SOLUTION | Freq: Four times a day (QID) | RESPIRATORY_TRACT | 1 refills | Status: DC | PRN

## 2015-10-29 MED ORDER — LEVOFLOXACIN 500 MG PO TABS: 500.0000 mg | ORAL_TABLET | Freq: Every day | ORAL | 0 refills | Status: DC

## 2015-10-29 MED ORDER — HYDROCODONE-HOMATROPINE 5-1.5 MG/5ML PO SYRP: 5.0000 mL | ORAL_SOLUTION | Freq: Three times a day (TID) | ORAL | 0 refills | Status: DC | PRN

## 2015-10-29 MED ORDER — PREDNISONE 10 MG PO TABS: ORAL_TABLET | ORAL | 0 refills | Status: DC

## 2015-10-29 NOTE — Progress Notes (Signed)
   Subjective:    Patient ID: [name], female    DOB: 01-May-1960, 55 y.o.   MRN: [id number]  HPI  Was seen in ED with URI and UTI  On Sept 23.She was given  Rocephin and started on Keflex 500 mg tid x 7 days. Has albuterol inhaler but needs nebulizer solution. When seen in the emergency department was complaining of sore throat, nasal congestion, body aches, nausea, cough, chills and headache. Tincher was 99.4 orally with respiratory rate of 16. Was not thought to be in respiratory distress. Was diagnosed with viral illness. Was given Phenergan and Tessalon Perles in addition to Keflex. Chest x-ray showed no pneumonia.  Urine culture grew greater than 100,000 colonies per milliliter Klebsiella pneumoniae  sensitive to Keflex.  Urinalysis today is unremarkable except for glucosuria. She does have history of diabetes mellitus.     Review of Systems still short of breath     Objective:   Physical Exam Still has  congested cough. Chest is clear to auscultation without rales or wheezing at present time. Skin is warm and dry. Dipstick UA is normal. Pharynx is clear       Assessment & Plan:  Acute UTI-resolved  Acute respiratory infection/viral illness. Still with cough and shortness of breath. Refill Hycodan at her request.  Sterapred 10 mg 6 day dosepak

## 2015-11-08 MED FILL — CYCLOBENZAPRINE 10 MG TAB: 10 | 30 days supply | Qty: 30 | Fill #2

## 2015-11-12 DIAGNOSIS — F331 Major depressive disorder, recurrent, moderate: Secondary | ICD-10-CM | POA: Diagnosis not present

## 2015-11-23 NOTE — Patient Instructions (Addendum)
UTI has resolved. Sterapred DS 10 mg 6 day Dosepak. Hycodan refilled. Nebulizer solution prescribed

## 2015-12-05 ENCOUNTER — Other Ambulatory Visit: Payer: Self-pay | Admitting: "Endocrinology

## 2015-12-05 DIAGNOSIS — IMO0002 Reserved for concepts with insufficient information to code with codable children: Secondary | ICD-10-CM

## 2015-12-05 DIAGNOSIS — E1110 Type 2 diabetes mellitus with ketoacidosis without coma: Secondary | ICD-10-CM

## 2015-12-05 DIAGNOSIS — E1165 Type 2 diabetes mellitus with hyperglycemia: Secondary | ICD-10-CM

## 2015-12-05 MED FILL — metFORMIN HCL 500 MG TABS: 500 | 90 days supply | Qty: 180 | Fill #2

## 2015-12-05 MED FILL — CYCLOBENZAPRINE 10 MG TAB: 10 | 30 days supply | Qty: 30 | Fill #3

## 2015-12-26 MED FILL — VICTOZA 18 MG/3 ML INJECT P: 18 | 90 days supply | Qty: 27 | Fill #0

## 2015-12-31 ENCOUNTER — Ambulatory Visit: Payer: 59 | Admitting: Internal Medicine

## 2015-12-31 ENCOUNTER — Ambulatory Visit (INDEPENDENT_AMBULATORY_CARE_PROVIDER_SITE_OTHER): Payer: 59 | Admitting: "Endocrinology

## 2015-12-31 ENCOUNTER — Encounter (INDEPENDENT_AMBULATORY_CARE_PROVIDER_SITE_OTHER): Payer: Self-pay | Admitting: "Endocrinology

## 2015-12-31 VITALS — BP 174/92 | HR 86 | Wt 210.0 lb

## 2015-12-31 DIAGNOSIS — IMO0001 Reserved for inherently not codable concepts without codable children: Secondary | ICD-10-CM

## 2015-12-31 DIAGNOSIS — Z91199 Patient's noncompliance with other medical treatment and regimen due to unspecified reason: Secondary | ICD-10-CM

## 2015-12-31 DIAGNOSIS — R4584 Anhedonia: Secondary | ICD-10-CM

## 2015-12-31 DIAGNOSIS — K219 Gastro-esophageal reflux disease without esophagitis: Secondary | ICD-10-CM

## 2015-12-31 DIAGNOSIS — I10 Essential (primary) hypertension: Secondary | ICD-10-CM | POA: Diagnosis not present

## 2015-12-31 DIAGNOSIS — E119 Type 2 diabetes mellitus without complications: Secondary | ICD-10-CM | POA: Diagnosis not present

## 2015-12-31 DIAGNOSIS — Z794 Long term (current) use of insulin: Secondary | ICD-10-CM | POA: Diagnosis not present

## 2015-12-31 DIAGNOSIS — E11649 Type 2 diabetes mellitus with hypoglycemia without coma: Secondary | ICD-10-CM

## 2015-12-31 DIAGNOSIS — Z9119 Patient's noncompliance with other medical treatment and regimen: Secondary | ICD-10-CM | POA: Diagnosis not present

## 2015-12-31 DIAGNOSIS — R252 Cramp and spasm: Secondary | ICD-10-CM

## 2015-12-31 DIAGNOSIS — E6609 Other obesity due to excess calories: Secondary | ICD-10-CM | POA: Diagnosis not present

## 2015-12-31 DIAGNOSIS — F331 Major depressive disorder, recurrent, moderate: Secondary | ICD-10-CM | POA: Diagnosis not present

## 2015-12-31 LAB — COMPREHENSIVE METABOLIC PANEL
ALBUMIN: 4 g/dL (ref 3.6–5.1)
ALK PHOS: 109 U/L (ref 33–130)
ALT: 18 U/L (ref 6–29)
AST: 20 U/L (ref 10–35)
BUN: 14 mg/dL (ref 7–25)
CALCIUM: 9 mg/dL (ref 8.6–10.4)
CO2: 24 mmol/L (ref 20–31)
Chloride: 102 mmol/L (ref 98–110)
Creat: 1.14 mg/dL — ABNORMAL HIGH (ref 0.50–1.05)
GLUCOSE: 388 mg/dL — AB (ref 70–99)
POTASSIUM: 4.5 mmol/L (ref 3.5–5.3)
Sodium: 135 mmol/L (ref 135–146)
Total Bilirubin: 0.4 mg/dL (ref 0.2–1.2)
Total Protein: 6.4 g/dL (ref 6.1–8.1)

## 2015-12-31 LAB — POCT GLYCOSYLATED HEMOGLOBIN (HGB A1C): Hemoglobin A1C: 13.4

## 2015-12-31 LAB — GLUCOSE, POCT (MANUAL RESULT ENTRY): POC GLUCOSE: 390 mg/dL — AB (ref 70–99)

## 2015-12-31 NOTE — Patient Instructions (Signed)
Follow up visit in 2 months.  

## 2015-12-31 NOTE — Progress Notes (Signed)
Subjective:  Patient Name: Alexa [name] Date of Birth: December 08, 1960  MRN: [id number]  Saima Pescador  presents to the office today for follow-up of her type 2 diabetes mellitus, obesity, combined hyperlipidemia, GERD, hypertension, ASHD, dyspepsia, pedal edema, non-proliferative diabetic retinopathy, goiter, depression, autonomic neuropathy, tachycardia, peripheral neuropathy, vitamin D deficiency, hypocalcemia, secondary hyperparathyroidism, glossitis, pallor, fatigue, iron deficiency anemia, anhedonia, disinclination to take medicines, and status post Roux-en-Y gastric bypass.   HISTORY OF PRESENT ILLNESS:   Alexa [name] is a 55 y.o. Caucasian woman. Alexa [name] was unaccompanied.  1. The patient first presented to me on 08/20/04 in referral from her primary care internist, Dr. Emeline General, for evaluation and management of type 2 diabetes, obesity, and multiple medical issues. She was 55 years old.   [name] had a long history of obesity. She was heavy as a child. She underwent menarche at age 55. At age 41 she was 180 pounds. In 1996 she weighed 218 pounds. In 1999 she was diagnosed with type 2 diabetes mellitus. Her weight at that point was 250 pounds. She was treated with Glucophage, and Actos. Actos made her gain even more weight. She had also been treated with glipizide in the past. More recently she had been treated with Lantus and Glucophage plus regular insulin as needed, especially for when she took steroids for asthma attacks. Maximum weight had been 296 pounds one month prior to that first visit with me. Her tendency to gain weight and her difficulty in losing weight were aggravated by long-standing, intermittent depression and by severe, recurrent asthma requiring the use of steroid medications.  B. Past medical history was also positive for a 60% blockage in one of her coronary arteries. She had significant issues with GERD and dyspepsia. She also had combined hyperlipidemia. She had a previous  cardiac catheterization and previous tonsillectomy. She was allergic to theophylline. Her pertinent review of systems was positive for some numbness and tingling in her feet. Family history was positive for type 2 diabetes in her father and her maternal grandmother. Her brother weighed 250 pounds at a height of 76 inches. Both her mother and her sister were hypothyroid.  C. On physical examination, her weight was 279.9 pounds. Her BMI was 49.6. Her blood pressure was 142/86. Her heart rate was 96. Her hemoglobin A1c was 9.8%. She was alert and oriented x3. Her affect was normal. Her insight was fair. She was obviously quite obese. She had a 25 gm thyroid gland. She had 1+ tremor of her hands. She had 1+ DP pulses and 2+ tinea pedis. Sensation to touch was intact in her feet. Laboratory data included a normal CMP. Her cholesterol was 175, triglycerides 76, HDL 53, and LDL 107. Her TSH was 2.98. Since she obviously did have type 2 diabetes mellitus associated with morbid obesity, and since weight loss was a major factor for her, I asked her to resume her metformin twice daily. I also started her on Byetta, initially 5 mcg twice daily and then 10 mcg twice daily. I discontinued her Lantus insulin.  2. During the last 11 years,we have had some major successes and some new problem areas.  A. T2DM: In October 2007 the combination of Byetta and metformin was causing more gastrointestinal problems. Patient opted to stop the metformin and continue the Byetta because it was helping her with weight control and blood sugar control. By 08/11/06 her weight had decreased to 267.6 pounds. Hemoglobin A1c was 8.6%. At that point she decided to have  bariatric surgery. She had a Roux-en-Y gastric bypass on 12/20/2006. She subsequently lost weight down to 173.4 pounds on 05/23/08, but then subsequently regained weight to the 190s. Her  hemoglobin A1c values varied in parallel with her weights. On 05/23/08, at the point of her lowest  weight, her hemoglobin A1c dropped to a nadir of 6.3%.  Since then her hemoglobin A1c values have varied between 6.6 and 10.3%. Although we were initially able to stop all of her diabetes medicines after bariatric surgery, when she began to regain weight we restarted metformin, 500 mg twice daily. Although she took her metformin twice daily for a long time, she had discontinued it many months ago. She hated to take medicines and could not make herself be compliant in taking medications or exercising. When her HbA1c increased to 9.4% in September 2015, I stopped her Byetta injections and started her on liraglutide (Victoza) daily injections on 11/16/13.  B. Vitamin D deficiency, hypocalcemia, and secondary hyperparathyroidism: Just before her gastric bypass, I obtained baseline bone mineral metabolism studies. Her 25-hydroxy vitamin D was very low at 7 (normal greater than or equal to 30). Her calcium was 9.5 (normal 8.6-10.6). Her parathyroid hormone was 38.4 (normal 14-72). Subsequent to her surgery, the patient was supposed to be taking multivitamins with calcium and vitamin D, but did not always do so. On 03/29/2007 her 25-hydroxy vitamin D was 29, but her calcium decreased to 8.7. Her PTH was slightly elevated at 73.5. Her 1,25-hydroxy vitamin D was 46. Her iron was 56. I asked her to make sure she took her multivitamins and calcium daily. Unfortunately, she has often been non-compliant with her multivitamins and calcium. Her 25-hydroxy vitamin D values have varied between 15-35. Her calcium values have varied between 8.1-9.4. Her PTH values have remained elevated between 79-167. The patient's iron levels have also been low, between 32-34. She is supposed to be taking iron every day as well. Unfortunately, in August of 2014 she discontinued all vitamins and supplements.   C. The patient has had psychological issues for many years that have adversely affected her eating, her unwillingness to exercise, her  noncompliance with taking medications, her weight, and her T2DM care. Fortunately, her recent outpatient psychological therapy with Ms. Rea College, RN, MS has often resulted in Alexa [name] having a much more positive attitude about life in general and a generally more positive attitude about taking her medications.  3. The patient's last [facility] visit was on 10/08/15.   A. In the interim she has pretty much stopped taking her medications. She also had two episodes of asthma and took oral steroids, during which she doubled her metformin dose. She also was without home refrigeration for one month. She has been very highly stressed on the [facility], which has been full for more than one month. She has not had as much trouble sleeping. Night sweats are a bit less frequent. She is more frustrated and has less patience recently. She has requested an appointment with Ms. Showfety for later this afternoon. "I really need to see Alexa [name] today."    B. She is still drinking about two regular Cokes per week. She has not been exercising. did not resume swimming and has not been walking except on the job. She still has problems with work stress and other issues, but she has less anxiety. She continues to see Ms. [employer].   C. She has recently been taking her meds more consistently, but still inconsistently. She is supposed to be  taking Lexapro, Synthroid, Micardis, Victoza, Protonix, metformin, iron, calcium, MVI.  4. Review of Systems: There are no other significant issues  Constitutional: The patient feels "tired, angry,  and has lots of aches and pains throughout her body.   Eyes: Vision is fairly good. There are no significant eye complaints. The last eye exam that I am aware of occurred in the Spring of 2014. I have asked her to obtain a follow up eye exam, but she has not yet scheduled an exam.  Neck: The patient has no complaints of anterior neck swelling, soreness, tenderness, pressure, or discomfort.   Heart: Heart rate increases with exercise or other physical activity. The patient has no complaints of palpitations, irregular heat beats, chest pain, or chest pressure. Gastrointestinal: The patient has not had any constipation recently. She does not have any excessive hunger, acid reflux, upset stomach, stomach aches or pains, or diarrhea. Legs: She has been having nocturnal calf cramps almost every night. Muscle mass and strength seem normal. Tinea improves when she uses her anti-fungal cream. There are no new complaints of numbness, tingling, or burning. No edema is noted. Feet: She has also been having nocturnal foot cramps. There are no new complaints of numbness, tingling, or burning. No edema is noted. Neuro: No new sensory or muscular problems Psych: She has been more angry and irritable.  GU: She has been urinating a lot.  Hypoglycemia: No symptoms  5. BG printout: She did not bring in her BG meter today. She has not been checking her BGs.   PAST MEDICAL, FAMILY, AND SOCIAL HISTORY:  Past Medical History:  Diagnosis Date  . Anemia, iron deficiency   . Asthma   . Asthma, chronic   . CAD (coronary artery disease)   . Chronic insomnia   . Combined hyperlipidemia   . Depression   . Diabetes mellitus   . Diabetes mellitus type II   . Diabetic autonomic neuropathy ([place])   . DM type 2 with diabetic peripheral neuropathy ([place])   . Dyspepsia   . Elevated homocysteine ([place])   . Essential tremor   . Fatigue   . GERD (gastroesophageal reflux disease)   . GERD (gastroesophageal reflux disease)   . Glossitis   . Goiter   . Hyperparathyroidism , secondary, non-renal ([place])   . Hypertension   . Nonproliferative diabetic retinopathy associated with type 2 diabetes mellitus ([place])    RESOLVED  . Obesity, Class III, BMI 40-49.9 (morbid obesity) ([place])   . Pallor   . Tachycardia   . Thyroiditis, autoimmune   . Vitamin D deficiency     Family History  Problem Relation Age of Onset   . Thyroid disease Mother     Hypothyroid  . Diabetes Father     T2 DM  . Obesity Brother     250 pounds at a height of 76 inches.  . Thyroid disease Sister     Hypothyroid  . Diabetes Maternal Grandmother   . Colon cancer Other     great grandfather  . Liver disease Maternal Aunt     NASH  . Esophageal cancer Neg Hx   . Pancreatic cancer Neg Hx   . Kidney disease Neg Hx      Current Outpatient Prescriptions:  .  albuterol (PROVENTIL HFA;VENTOLIN HFA) 108 (90 Base) MCG/ACT inhaler, Inhale 2 puffs into the lungs every 6 (six) hours as needed for wheezing or shortness of breath. (Patient not taking: Reported on 12/31/2015), Disp: 1 Inhaler, Rfl: 11 .  albuterol (PROVENTIL) (2.5 MG/3ML) 0.083% nebulizer solution, Take 3 mLs (2.5 mg total) by nebulization every 6 (six) hours as needed for wheezing or shortness of breath. (Patient not taking: Reported on 12/31/2015), Disp: 150 mL, Rfl: 1 .  benzonatate (TESSALON) 100 MG capsule, Take 1 capsule (100 mg total) by mouth every 8 (eight) hours. (Patient not taking: Reported on 12/31/2015), Disp: 21 capsule, Rfl: 0 .  Calcium Carbonate-Vitamin D (CALCIUM-VITAMIN D) 500-200 MG-UNIT per tablet, Take 1 tablet by mouth 2 (two) times daily with a meal. Reported on 02/26/2015, Disp: , Rfl:  .  cephALEXin (KEFLEX) 500 MG capsule, Take 1 capsule (500 mg total) by mouth 3 (three) times daily. (Patient not taking: Reported on 12/31/2015), Disp: 21 capsule, Rfl: 0 .  conjugated estrogens (PREMARIN) vaginal cream, Place 1 Applicatorful vaginally daily. (Patient not taking: Reported on 12/31/2015), Disp: 42.5 g, Rfl: 11 .  cyclobenzaprine (FLEXERIL) 10 MG tablet, 1 tab PRN (Patient not taking: Reported on 12/31/2015), Disp: 30 tablet, Rfl: 11 .  diphenhydrAMINE (BENADRYL) 25 MG tablet, Take 25-50 mg by mouth 2 (two) times daily as needed for allergies or sleep., Disp: , Rfl:  .  escitalopram (LEXAPRO) 20 MG tablet, Take 20 mg by mouth daily. , Disp: , Rfl: 3 .   FERROUS SULFATE PO, Take 1 tablet by mouth daily., Disp: , Rfl:  .  HYDROcodone-acetaminophen (NORCO/VICODIN) 5-325 MG tablet, Take 1 tablet by mouth every 6 (six) hours as needed for moderate pain. (Patient not taking: Reported on 12/31/2015), Disp: 60 tablet, Rfl: 0 .  HYDROcodone-homatropine (HYCODAN) 5-1.5 MG/5ML syrup, Take 5 mLs by mouth every 8 (eight) hours as needed for cough. (Patient not taking: Reported on 12/31/2015), Disp: 120 mL, Rfl: 0 .  levofloxacin (LEVAQUIN) 500 MG tablet, Take 1 tablet (500 mg total) by mouth daily. (Patient not taking: Reported on 12/31/2015), Disp: 10 tablet, Rfl: 0 .  levothyroxine (SYNTHROID, LEVOTHROID) 25 MCG tablet, Take 25 mcg by mouth daily before breakfast., Disp: , Rfl:  .  metFORMIN (GLUCOPHAGE) 500 MG tablet, TAKE 1 TABLET BY MOUTH AT BREAKFAST AND ONE AT DINNER. (Patient not taking: Reported on 12/31/2015), Disp: 180 tablet, Rfl: 3 .  Multiple Vitamin (MULTIVITAMIN) tablet, Take 1 tablet by mouth daily. Reported on 02/26/2015, Disp: , Rfl:  .  pantoprazole (PROTONIX) 40 MG tablet, Take 1 tablet (40 mg total) by mouth daily. (Patient not taking: Reported on 12/31/2015), Disp: 90 tablet, Rfl: 4 .  predniSONE (DELTASONE) 10 MG tablet, Take in tapering course as directed 6-5-4-3-2-1 (Patient not taking: Reported on 12/31/2015), Disp: 21 tablet, Rfl: 0 .  promethazine (PHENERGAN) 25 MG tablet, Take 1 tablet (25 mg total) by mouth every 6 (six) hours as needed for nausea or vomiting. (Patient not taking: Reported on 12/31/2015), Disp: 12 tablet, Rfl: 0 .  telmisartan (MICARDIS) 40 MG tablet, TAKE 1 TABLET BY MOUTH EACH MORNING. (Patient not taking: Reported on 12/31/2015), Disp: 90 tablet, Rfl: 3 .  VICTOZA 18 MG/3ML SOPN, INJECT 1.8 MG DAILY (Patient not taking: Reported on 12/31/2015), Disp: 27 mL, Rfl: PRN  Allergies as of 12/31/2015 - Review Complete 12/31/2015  Allergen Reaction Noted  . Theophyllines Palpitations 05/13/2010    1. 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 2. Activities: She has not done much for herself recently. She and her husband did visit [place] [address] in [place], [place], which they enjoy, but which also requires a lot of work.   3. Smoking, alcohol, or drugs: None 4. Primary Care Provider: Dr. Tommie Ard Baxley 5. Therapist: Ms. Rea College, MS  REVIEW OF SYSTEM: There are no other significant problems involving Brandye's other body systems.   Objective:  Vital Signs:  BP (!) 174/92   Pulse 86   Wt 210 lb (95.3 kg)   LMP 05/11/2010   BMI 36.05 kg/m    Ht Readings from Last 3 Encounters:  08/20/15 5\' 4"  (1.626 m)  07/08/15 5\' 4"  (1.626 m)  09/04/14 5\' 4"  (1.626 m)   Wt Readings from Last 3 Encounters:  12/31/15 210 lb (95.3 kg)  10/29/15 212 lb (96.2 kg)  10/08/15 211 lb 12.8 oz (96.1 kg)   PHYSICAL EXAM:  Constitutional:  The patient looks healthy, perky, bright, alert, and surprisingly good today. Her insight is normal. She has lost 1 pound since her last visit.  Eyes: There is no arcus or proptosis.  Mouth: The oropharynx appears normal. The tongue appears normal. There is normal oral moisture. There is no obvious gingivitis. Neck: There are no bruits present. The thyroid gland appears normal in size. The thyroid gland is again enlarged at approximately 21 grams in size. The right lobe is normal in size. The left lobe is mildly enlarged. The consistency of the thyroid gland is normal. There is no thyroid tenderness to palpation. Lungs: The lungs are clear. Air movement is good. Heart: The heart rhythm and rate appear normal. Heart sounds S1 and S2 are normal. I do not appreciate any pathologic heart murmurs. Abdomen: The abdomen is quite enlarged. Bowel sounds are normal. The abdomen is soft and non-tender. There is no obviously palpable hepatomegaly, splenomegaly,  or other masses.  Arms: Muscle mass appears appropriate for age.  Hands: She has a trace tremor. Phalangeal and metacarpophalangeal joints appear normal. Palms are normal. Legs: Muscle mass appears appropriate for age. There is no edema.  Feet: There are no significant deformities. Dorsalis pedis pulses are normal 2+ bilaterally.  Neurologic: Muscle strength is normal for age and gender  in both the upper and the lower extremities. Muscle tone appears normal. Sensation to touch is normal in the legs. Sensation to touch, monofilament, and vibration is intact in the feet.    LAB DATA:  Labs 12/31/15: HbA1c 13.4%, CBG 390  Labs 10/08/15: HbA1c 9.6%  Labs 07/02/15: HbA1c 8.9%; PTH 107, calcium 9.2, 25-OH vitamin D 33; CBC normal; CMP normal except for creatinine 1.07; cholesterol 174, triglycerides 91, HDL 74, LDL 82; TSH 2.43, free T4 1.2, free T3 2.3  Labs 02/19/15: PTH 99, calcium 8.7, 25-OH vitamin D 20  Labs 10/08/14: HbA1c 8.0%   Labs 08/06/14: HbA1c 7.4%  Labs 08/01/14: TSH 2.014, free T4 0.87, free T3 2.4; PTH 79 (normal 14-64), calcium 9.1; 25-OH vitamin D 35 CMP normal  Labs 05/11/14: HbA1c was 7.7%, without any low BGs; TSH 2.416, free T4 1.13, free T3 2.3  Labs 02/22/14: HbA1c 11.6%  Labs 02/13/14: Calcium 9.0, PTH 131, 25-OH vitamin D 15; CMP normal except for glucose of 165; cholesterol 177, triglycerides 108, HDL 90, LDL 65; urinary microalbumin/creatinine ratio 15.6; TSH 3.639, free T4 1.30, free T3 2.5  Labs 10/10/13: HbA1c is 9.4%, compared with 8.1% at last visit and with 9.3% in December;   Labs 05/08/13: HbA1c 8.1%; PTH 90.2, calcium 9.5, 25-hydroxy vitamin D 46; CMP normal except for glucose of 215; WBC 8.0, RBC  5.20, Hgb 12.5, Hct 37.8%, MCV 72.7 (normal 78-100), iron 55  Labs 02/23/13: CMP normal, except glucose 194 and alkaline phosphatase 126; microalbumin/creatinine ratio was 11.5; TSH 2.086, free T4 1.29, free T3 3.2, TPO antibody < 10; 25-hydroxy vitamin D 18; C-peptide  1.79; cholesterol 156, triglycerides 64, HDL 61, LDL 82  Labs 01/05/13: Hemoglobin A1c was 9.3%, compared with 9.3 at last visit and with 9.1% at the visit prior. All of these values were major increases from 6.6% in May 2012.    Labs 04/14/12: 25-hydroxy vitamin D 21, iron 203,  Vitamin B12 379,   Labs 11/16/11: Hgb 10.3, Hct 32.4%; CMP normal except glucose 146' cholesterol 201, triglycerides 70, HDL 66, LDL 121; vitamin D 22           Labs 06/09/10: Cholesterol was 175, triglycerides 76, HDL 62, LDL 98. TSH was 1.217. Free T4 was 1.10. Free T3 was 2.7. 25-hydroxy vitamin D was 19. PTH was 167.   Assessment and Plan:   ASSESSMENT:  1. Type 2 diabetes mellitus:   A. Her T2 DM is out of control due to her not taking medications reliably and to not exercising.   B. Her HbA1c of 13.4% is probably the highest that it has ever been.   2. Hypoglycemia: No subjective complaints  3. Obesity: Her weight has decreased one pound. She needs to review her carb intake and reduce the carb intake as much as she can. Her Victoza had been helping her with weight loss when she took it regularly.  4-5. Autonomic neuropathy and tachycardia: The patient's autonomic neuropathy and  heart rate improved as a result of her lower HbA1c at her last visit. Unfortunately, all of her autonomic parameters will worsen again unless she takes action to control her BGs.   6. Hypertension: Her BP is much worse. She is endangering her heart, brain, kidneys, and eyes.  7-8. Anhedonia and anxiety: She is doing worse. She needs to continue to take her medications. She also needs to call for help when she begins to do poorly. She also needs to continue to find ways to have more joy in her life. 9-12. GERD/esophagitis/difficulty swallowing/gastroparesis: Her reflux and other GI symptoms have improved.  13-15: Hypocalcemia, vitamin D deficiency, and secondary hyperparathyroidism:   A. Due to her bariatric surgery she has more difficulty  absorbing vitamin D and calcium. When she develops hypocalcemia and vitamin D deficiency, her parathyroid glands appropriated produce more PTH, resulting in secondary hyperparathyroidism. However, if she takes her calcium and vitamin D all three parameters will normalize.   B. Her vitamin D level was normal in June 2017. Unfortunately, she missed too many doses of calcium, resulting in higher PTH levels.  16. Noncompliance: Since last visit her compliance with eating right, exercising, and taking Victoza has decreased even more. 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  17: Muscle cramps: We need to check her calcium and potassium levels.   PLAN:  1. Diagnostic: Repeat her CMP now to check up on her calcium and potassium.  2. Therapeutic: Resume medications as currently prescribed. Take Contrave. Increase the Citracal D to 3 tablets, twice daily. Order Biotech, 50,000 IU per week if her current meds are not adequate. Try to fit in exercise daily.  See Ms. Showfety in follow up.   3. Patient education: She  must take her medications and supplements.  4. Follow-up: 2 months   Level of Service: This visit lasted in excess of 40 minutes. More than 50% of the visit was devoted to counseling.   Sherrlyn Hock, MD, CDE Adult and Pediatric Endocrinology 12/31/2015 1:39 PM

## 2016-01-14 ENCOUNTER — Encounter (INDEPENDENT_AMBULATORY_CARE_PROVIDER_SITE_OTHER): Payer: Self-pay | Admitting: *Deleted

## 2016-01-14 ENCOUNTER — Ambulatory Visit (INDEPENDENT_AMBULATORY_CARE_PROVIDER_SITE_OTHER): Payer: 59 | Admitting: Internal Medicine

## 2016-01-14 ENCOUNTER — Encounter: Payer: Self-pay | Admitting: Internal Medicine

## 2016-01-14 VITALS — BP 122/82 | HR 97 | Temp 98.5°F | Ht 64.0 in | Wt 209.0 lb

## 2016-01-14 DIAGNOSIS — Z62810 Personal history of physical and sexual abuse in childhood: Secondary | ICD-10-CM | POA: Diagnosis not present

## 2016-01-14 DIAGNOSIS — Z8739 Personal history of other diseases of the musculoskeletal system and connective tissue: Secondary | ICD-10-CM | POA: Diagnosis not present

## 2016-01-14 DIAGNOSIS — Z8709 Personal history of other diseases of the respiratory system: Secondary | ICD-10-CM

## 2016-01-14 DIAGNOSIS — Z8659 Personal history of other mental and behavioral disorders: Secondary | ICD-10-CM

## 2016-01-14 DIAGNOSIS — J069 Acute upper respiratory infection, unspecified: Secondary | ICD-10-CM | POA: Diagnosis not present

## 2016-01-14 DIAGNOSIS — K5900 Constipation, unspecified: Secondary | ICD-10-CM

## 2016-01-14 DIAGNOSIS — E1165 Type 2 diabetes mellitus with hyperglycemia: Secondary | ICD-10-CM

## 2016-01-14 DIAGNOSIS — Z9889 Other specified postprocedural states: Secondary | ICD-10-CM

## 2016-01-14 DIAGNOSIS — Z9884 Bariatric surgery status: Secondary | ICD-10-CM

## 2016-01-14 DIAGNOSIS — F331 Major depressive disorder, recurrent, moderate: Secondary | ICD-10-CM | POA: Diagnosis not present

## 2016-01-14 MED ORDER — CYCLOBENZAPRINE HCL 10 MG PO TABS: 10.0000 mg | ORAL_TABLET | Freq: Three times a day (TID) | ORAL | 1 refills | Status: DC | PRN

## 2016-01-14 MED ORDER — TRAMADOL HCL 50 MG PO TABS: 50.0000 mg | ORAL_TABLET | Freq: Three times a day (TID) | ORAL | 0 refills | Status: DC | PRN

## 2016-01-14 MED ORDER — MELOXICAM 15 MG PO TABS: 15.0000 mg | ORAL_TABLET | Freq: Every day | ORAL | 0 refills | Status: DC

## 2016-01-14 MED FILL — PANTOPRAZOLE SOD DR 40 MG T: 40 | 90 days supply | Qty: 90 | Fill #1

## 2016-01-14 MED FILL — ESCITALOPRAM 20 MG TABLET: 20 | 90 days supply | Qty: 90 | Fill #0

## 2016-01-14 MED FILL — traMADol HCL 50 MG TABS: 50 | 10 days supply | Qty: 30 | Fill #0

## 2016-01-14 MED FILL — CYCLOBENZAPRINE 10 MG TAB: 10 | 30 days supply | Qty: 90 | Fill #0

## 2016-01-14 MED FILL — MELOXICAM 15 MG TABLET: 15 | 90 days supply | Qty: 90 | Fill #0

## 2016-01-14 MED FILL — CONTRAVE ER 8-90 MG TABLET: 8-90 | 30 days supply | Qty: 120 | Fill #1

## 2016-01-14 MED FILL — SYNTHROID 25 MCG TABLET: 25 | 90 days supply | Qty: 90 | Fill #3
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
## 2016-02-26 NOTE — Patient Instructions (Signed)
Please try to take better care of yourself and get diabetes under better control. Take medications as prescribed. Watch diet. Return June 2018 for physical exam. Continue follow-up with Dr. Tobe Sos.

## 2016-03-17 ENCOUNTER — Ambulatory Visit: Payer: 59 | Admitting: Internal Medicine

## 2016-03-17 ENCOUNTER — Encounter (INDEPENDENT_AMBULATORY_CARE_PROVIDER_SITE_OTHER): Payer: Self-pay | Admitting: "Endocrinology

## 2016-03-17 ENCOUNTER — Ambulatory Visit: Payer: Self-pay | Admitting: Internal Medicine

## 2016-03-17 ENCOUNTER — Ambulatory Visit (INDEPENDENT_AMBULATORY_CARE_PROVIDER_SITE_OTHER): Payer: 59 | Admitting: "Endocrinology

## 2016-03-17 VITALS — BP 130/76 | HR 90 | Ht 63.58 in | Wt 202.8 lb

## 2016-03-17 DIAGNOSIS — Z794 Long term (current) use of insulin: Secondary | ICD-10-CM

## 2016-03-17 DIAGNOSIS — E11649 Type 2 diabetes mellitus with hypoglycemia without coma: Secondary | ICD-10-CM

## 2016-03-17 DIAGNOSIS — E559 Vitamin D deficiency, unspecified: Secondary | ICD-10-CM

## 2016-03-17 DIAGNOSIS — I10 Essential (primary) hypertension: Secondary | ICD-10-CM

## 2016-03-17 DIAGNOSIS — E049 Nontoxic goiter, unspecified: Secondary | ICD-10-CM | POA: Diagnosis not present

## 2016-03-17 DIAGNOSIS — E1110 Type 2 diabetes mellitus with ketoacidosis without coma: Secondary | ICD-10-CM

## 2016-03-17 DIAGNOSIS — E211 Secondary hyperparathyroidism, not elsewhere classified: Secondary | ICD-10-CM

## 2016-03-17 DIAGNOSIS — E1165 Type 2 diabetes mellitus with hyperglycemia: Secondary | ICD-10-CM

## 2016-03-17 DIAGNOSIS — E1310 Other specified diabetes mellitus with ketoacidosis without coma: Secondary | ICD-10-CM | POA: Diagnosis not present

## 2016-03-17 DIAGNOSIS — Z9114 Patient's other noncompliance with medication regimen: Secondary | ICD-10-CM

## 2016-03-17 DIAGNOSIS — F32A Depression, unspecified: Secondary | ICD-10-CM

## 2016-03-17 DIAGNOSIS — E063 Autoimmune thyroiditis: Secondary | ICD-10-CM

## 2016-03-17 DIAGNOSIS — F329 Major depressive disorder, single episode, unspecified: Secondary | ICD-10-CM | POA: Diagnosis not present

## 2016-03-17 DIAGNOSIS — E038 Other specified hypothyroidism: Secondary | ICD-10-CM | POA: Diagnosis not present

## 2016-03-17 DIAGNOSIS — F39 Unspecified mood [affective] disorder: Secondary | ICD-10-CM | POA: Diagnosis not present

## 2016-03-17 DIAGNOSIS — IMO0001 Reserved for inherently not codable concepts without codable children: Secondary | ICD-10-CM

## 2016-03-17 LAB — GLUCOSE, POCT (MANUAL RESULT ENTRY): POC Glucose: 145 mg/dl — AB (ref 70–99)

## 2016-03-17 MED ORDER — METFORMIN HCL 500 MG PO TABS: ORAL_TABLET | ORAL | 3 refills | Status: DC

## 2016-03-17 MED ORDER — TELMISARTAN 40 MG PO TABS: ORAL_TABLET | ORAL | 3 refills | Status: DC

## 2016-03-17 MED ORDER — LEVOTHYROXINE SODIUM 25 MCG PO TABS: ORAL_TABLET | ORAL | 3 refills | Status: DC

## 2016-03-17 NOTE — Progress Notes (Signed)
Subjective:  Patient Name: Alexa [name] Date of Birth: 07-30-1960  MRN: [id number]  Alexa [name]  presents to the office today for follow-up of her type 2 diabetes mellitus, obesity, combined hyperlipidemia, GERD, hypertension, ASHD, dyspepsia, pedal edema, non-proliferative diabetic retinopathy, goiter, depression, autonomic neuropathy, tachycardia, peripheral neuropathy, vitamin D deficiency, hypocalcemia, secondary hyperparathyroidism, glossitis, pallor, fatigue, iron deficiency anemia, anhedonia, disinclination to take medicines, and status post Roux-en-Y gastric bypass.   HISTORY OF PRESENT ILLNESS:   Alexa [name] is a 56 y.o. Caucasian woman. Alexa [name] was unaccompanied.  1. The patient first presented to me on 08/20/04 in referral from her primary care internist, Dr. Emeline General, for evaluation and management of type 2 diabetes, obesity, and multiple medical issues. She was 56 years old.   Alexa [name] had a long history of obesity. She was 56 heavy as a child. She underwent menarche at age 89. At age 32 she was 180 pounds. In 1996 she weighed 218 pounds. In 1999 she was diagnosed with type 2 diabetes mellitus. Her weight at that point was 250 pounds. She was treated with Glucophage, and Actos. Actos made her gain even more weight. She had also been treated with glipizide in the past. More recently she had been treated with Lantus and Glucophage plus regular insulin as needed, especially for when she took steroids for asthma attacks. Maximum weight had been 296 pounds one month prior to that first visit with me. Her tendency to gain weight and her difficulty in losing weight were aggravated by long-standing, intermittent depression and by severe, recurrent asthma requiring the use of steroid medications.  B. Past medical history was also positive for a 60% blockage in one of her coronary arteries. She had significant issues with GERD and dyspepsia. She also had combined hyperlipidemia. She had a previous  cardiac catheterization and previous tonsillectomy. She was allergic to theophylline. Her pertinent review of systems was positive for some numbness and tingling in her feet. Family history was positive for type 2 diabetes in her father and her maternal grandmother. Her brother weighed 250 pounds at a height of 76 inches. Both her mother and her sister were hypothyroid.  C. On physical examination, her weight was 279.9 pounds. Her BMI was 49.6. Her blood pressure was 142/86. Her heart rate was 96. Her hemoglobin A1c was 9.8%. She was alert and oriented x3. Her affect was normal. Her insight was fair. She was obviously quite obese. She had a 25 gm thyroid gland. She had 1+ tremor of her hands. She had 1+ DP pulses and 2+ tinea pedis. Sensation to touch was intact in her feet. Laboratory data included a normal CMP. Her cholesterol was 175, triglycerides 76, HDL 53, and LDL 107. Her TSH was 2.98. Since she obviously did have type 2 diabetes mellitus associated with morbid obesity, and since weight loss was a major factor for her, I asked her to resume her metformin twice daily. I also started her on Byetta, initially 5 mcg twice daily and then 10 mcg twice daily. I discontinued her Lantus insulin.  2. During the last 11 years,we have had some major successes and some new problem areas.  A. T2DM: In October 2007 the combination of Byetta and metformin was causing more gastrointestinal problems. Patient opted to stop the metformin and continue the Byetta because it was helping her with weight control and blood sugar control. By 08/11/06 her weight had decreased to 267.6 pounds. Hemoglobin A1c was 8.6%. At that point she decided to have  bariatric surgery. She had a Roux-en-Y gastric bypass on 12/20/2006. She subsequently lost weight down to 173.4 pounds on 05/23/08, but then subsequently regained weight to the 190s. Her  hemoglobin A1c values varied in parallel with her weights. On 05/23/08, at the point of her lowest  weight, her hemoglobin A1c dropped to a nadir of 6.3%.  Since then her hemoglobin A1c values have varied between 6.6 and 10.3%. Although we were initially able to stop all of her diabetes medicines after bariatric surgery, when she began to regain weight we restarted metformin, 500 mg twice daily. Although she took her metformin twice daily for a long time, she had discontinued it many months ago. She hated to take medicines and could not make herself be compliant in taking medications or exercising. When her HbA1c increased to 9.4% in September 2015, I stopped her Byetta injections and started her on liraglutide (Victoza) daily injections on 11/16/13.  B. Vitamin D deficiency, hypocalcemia, and secondary hyperparathyroidism: Just before her gastric bypass, I obtained baseline bone mineral metabolism studies. Her 25-hydroxy vitamin D was very low at 7 (normal greater than or equal to 30). Her calcium was 9.5 (normal 8.6-10.6). Her parathyroid hormone was 38.4 (normal 14-72). Subsequent to her surgery, the patient was supposed to be taking multivitamins with calcium and vitamin D, but did not always do so. On 03/29/2007 her 25-hydroxy vitamin D was 29, but her calcium decreased to 8.7. Her PTH was slightly elevated at 73.5. Her 1,25-hydroxy vitamin D was 46. Her iron was 56. I asked her to make sure she took her multivitamins and calcium daily. Unfortunately, she has often been non-compliant with her multivitamins and calcium. Her 25-hydroxy vitamin D values have varied between 15-35. Her calcium values have varied between 8.1-9.4. Her PTH values have remained elevated between 79-167. The patient's iron levels have also been low, between 32-34. She is supposed to be taking iron every day as well. Unfortunately, in August of 2014 she discontinued all vitamins and supplements.   C. The patient has had psychological issues for many years that have adversely affected her eating, her unwillingness to exercise, her  noncompliance with taking medications, her weight, and her T2DM care. Fortunately, her recent outpatient psychological therapy with Ms. Rea College, RN, MS has often resulted in [place] having a much more positive attitude about life in general and a generally more positive attitude about taking her medications.  3. The patient's last [facility] visit was on 12/31/15.   A. In the interim she resumed taking all of her medications consistently after New Years.. Physically she is doing "OK". She has had a lot of stress with her mother-in-law being very ill and her brother-in-law who also has problems. The unit has also been very busy. She recently had a weekend getaway with her BFFs, so she feels better this week.   B. She also had several episodes of asthma, but has not had to use oral steroids.   C. She has been sleeping "pretty good". Night sweats are less frequent.   D. She will see Ms. Showfety again later this afternoon. These sessions have been "very helpful".     E. She is still drinking about two regular Cokes per week. She has not been exercising.   F. She is supposed to be taking Lexapro, 20 mg/day; Synthroid, 25 mcg/day; Micardis, 40 mg/day; Victoza,1.8 mg/day; Protonix, 40 mg/day; metformin 500 mg, twice daily; ron, three times per week; calcium carbonate with vitamin D, one tablet at lunch and  two tablets in the evenings; MVI. She is also taking Contrave, which has caused both a decrease in her appetite and early satiety.  4. Review of Systems: There are no other significant issues  Constitutional: The patient feels "pretty good".   Eyes: Vision is fairly good. There are no significant eye complaints. The last eye exam that I am aware of occurred in the Spring of 2014. I have asked her to obtain a follow up eye exam, but she has not yet scheduled an exam.  Neck: The patient has no complaints of anterior neck swelling, soreness, tenderness, pressure, or discomfort.  Heart: Heart rate increases  with exercise or other physical activity. The patient has no complaints of palpitations, irregular heat beats, chest pain, or chest pressure. Gastrointestinal: The patient has not had any constipation or post-prandial bloating recently. She does not have any excessive hunger, acid reflux, upset stomach, stomach aches or pains, or diarrhea. Legs: She has not been having many nocturnal calf cramps anymore. Muscle mass and strength seem normal. There are no new complaints of numbness, tingling, or burning. No edema is noted. Feet: She has not been having nocturnal foot cramps. There are no new complaints of numbness, tingling, or burning. No edema is noted. Tinea improves when she uses her anti-fungal cream.  Neuro: No new sensory or muscular problems Psych: She feels much better after her BFF weekend.   GU: She has been urinating a lot.  Hypoglycemia: She had one episode of low BG symptoms  Several weeks ago.   5. BG printout: She did not bring in her BG meter today. She has not been checking her BGs.   PAST MEDICAL, FAMILY, AND SOCIAL HISTORY:  Past Medical History:  Diagnosis Date  . Anemia, iron deficiency   . Asthma   . Asthma, chronic   . CAD (coronary artery disease)   . Chronic insomnia   . Combined hyperlipidemia   . Depression   . Diabetes mellitus   . Diabetes mellitus type II   . Diabetic autonomic neuropathy ([place])   . DM type 2 with diabetic peripheral neuropathy ([place])   . Dyspepsia   . Elevated homocysteine ([place])   . Essential tremor   . Fatigue   . GERD (gastroesophageal reflux disease)   . GERD (gastroesophageal reflux disease)   . Glossitis   . Goiter   . Hyperparathyroidism , secondary, non-renal ([place])   . Hypertension   . Nonproliferative diabetic retinopathy associated with type 2 diabetes mellitus ([place])    RESOLVED  . Obesity, Class III, BMI 40-49.9 (morbid obesity) ([place])   . Pallor   . Tachycardia   . Thyroiditis, autoimmune   . Vitamin D deficiency      Family History  Problem Relation Age of Onset  . Thyroid disease Mother     Hypothyroid  . Diabetes Father     T2 DM  . Obesity Brother     250 pounds at a height of 76 inches.  . Thyroid disease Sister     Hypothyroid  . Diabetes Maternal Grandmother   . Colon cancer Other     great grandfather  . Liver disease Maternal Aunt     NASH  . Esophageal cancer Neg Hx   . Pancreatic cancer Neg Hx   . Kidney disease Neg Hx      Current Outpatient Prescriptions:  .  Calcium Carbonate-Vitamin D (CALCIUM-VITAMIN D) 500-200 MG-UNIT per tablet, Take 1 tablet by mouth 2 (two) times daily with a  meal. Reported on 02/26/2015, Disp: , Rfl:  .  conjugated estrogens (PREMARIN) vaginal cream, Place 1 Applicatorful vaginally daily., Disp: 42.5 g, Rfl: 11 .  escitalopram (LEXAPRO) 20 MG tablet, Take 20 mg by mouth daily. , Disp: , Rfl: 3 .  FERROUS SULFATE PO, Take 1 tablet by mouth daily., Disp: , Rfl:  .  levofloxacin (LEVAQUIN) 500 MG tablet, Take 1 tablet (500 mg total) by mouth daily., Disp: 10 tablet, Rfl: 0 .  levothyroxine (SYNTHROID, LEVOTHROID) 25 MCG tablet, Take 25 mcg by mouth daily before breakfast., Disp: , Rfl:  .  metFORMIN (GLUCOPHAGE) 500 MG tablet, TAKE 1 TABLET BY MOUTH AT BREAKFAST AND ONE AT DINNER., Disp: 180 tablet, Rfl: 3 .  Multiple Vitamin (MULTIVITAMIN) tablet, Take 1 tablet by mouth daily. Reported on 02/26/2015, Disp: , Rfl:  .  pantoprazole (PROTONIX) 40 MG tablet, Take 1 tablet (40 mg total) by mouth daily., Disp: 90 tablet, Rfl: 4 .  predniSONE (DELTASONE) 10 MG tablet, Take in tapering course as directed 6-5-4-3-2-1, Disp: 21 tablet, Rfl: 0 .  telmisartan (MICARDIS) 40 MG tablet, TAKE 1 TABLET BY MOUTH EACH MORNING., Disp: 90 tablet, Rfl: 3 .  VICTOZA 18 MG/3ML SOPN, INJECT 1.8 MG DAILY, Disp: 27 mL, Rfl: PRN .  albuterol (PROVENTIL HFA;VENTOLIN HFA) 108 (90 Base) MCG/ACT inhaler, Inhale 2 puffs into the lungs every 6 (six) hours as needed for wheezing or shortness  of breath. (Patient not taking: Reported on 01/14/2016), Disp: 1 Inhaler, Rfl: 11 .  albuterol (PROVENTIL) (2.5 MG/3ML) 0.083% nebulizer solution, Take 3 mLs (2.5 mg total) by nebulization every 6 (six) hours as needed for wheezing or shortness of breath. (Patient not taking: Reported on 01/14/2016), Disp: 150 mL, Rfl: 1 .  benzonatate (TESSALON) 100 MG capsule, Take 1 capsule (100 mg total) by mouth every 8 (eight) hours. (Patient not taking: Reported on 01/14/2016), Disp: 21 capsule, Rfl: 0 .  cephALEXin (KEFLEX) 500 MG capsule, Take 1 capsule (500 mg total) by mouth 3 (three) times daily. (Patient not taking: Reported on 01/14/2016), Disp: 21 capsule, Rfl: 0 .  cyclobenzaprine (FLEXERIL) 10 MG tablet, Take 1 tablet (10 mg total) by mouth 3 (three) times daily as needed for muscle spasms. (Patient not taking: Reported on 03/17/2016), Disp: 90 tablet, Rfl: 1 .  diphenhydrAMINE (BENADRYL) 25 MG tablet, Take 25-50 mg by mouth 2 (two) times daily as needed for allergies or sleep., Disp: , Rfl:  .  HYDROcodone-acetaminophen (NORCO/VICODIN) 5-325 MG tablet, Take 1 tablet by mouth every 6 (six) hours as needed for moderate pain. (Patient not taking: Reported on 01/14/2016), Disp: 60 tablet, Rfl: 0 .  HYDROcodone-homatropine (HYCODAN) 5-1.5 MG/5ML syrup, Take 5 mLs by mouth every 8 (eight) hours as needed for cough. (Patient not taking: Reported on 01/14/2016), Disp: 120 mL, Rfl: 0 .  meloxicam (MOBIC) 15 MG tablet, Take 1 tablet (15 mg total) by mouth daily. (Patient not taking: Reported on 03/17/2016), Disp: 90 tablet, Rfl: 0 .  Naltrexone-Bupropion HCl ER 8-90 MG TB12, Take by mouth daily., Disp: , Rfl:  .  promethazine (PHENERGAN) 25 MG tablet, Take 1 tablet (25 mg total) by mouth every 6 (six) hours as needed for nausea or vomiting. (Patient not taking: Reported on 01/14/2016), Disp: 12 tablet, Rfl: 0 .  traMADol (ULTRAM) 50 MG tablet, Take 1 tablet (50 mg total) by mouth every 8 (eight) hours as needed.  (Patient not taking: Reported on 03/17/2016), Disp: 30 tablet, Rfl: 0  Allergies as of 03/17/2016 - Review Complete  03/17/2016  Allergen Reaction Noted  . Theophyllines Palpitations 05/13/2010    1. 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 2. Activities: She has not done much physical activity. She and her husband often visit [place] off the [facility] in [place], [place], which they enjoy.   3. Smoking, alcohol, or drugs: None 4. Primary Care Provider: Dr. Tommie Ard Baxley 5. Therapist: Ms. Rea College, MS  REVIEW OF SYSTEM: There are no other significant problems involving [place]'s other body systems.   Objective:  Vital Signs:  BP 130/76   Pulse 90   Ht 5' 3.58" (1.615 m)   Wt 202 lb 12.8 oz (92 kg)   LMP 05/11/2010   BMI 35.27 kg/m    Ht Readings from Last 3 Encounters:  03/17/16 5' 3.58" (1.615 m)  01/14/16 5\' 4"  (1.626 m)  08/20/15 5\' 4"  (1.626 m)   Wt Readings from Last 3 Encounters:  03/17/16 202 lb 12.8 oz (92 kg)  01/14/16 209 lb (94.8 kg)  12/31/15 210 lb (95.3 kg)   PHYSICAL EXAM:  Constitutional:  The patient looks healthy, bright, alert, and quite good today. She is wearing a skirt and blouse, make up, and lipstick today. Her insight is normal. Her affect is normal and upbeat. She has lost 8 pounds since her last visit.  Eyes: There is no arcus or proptosis.  Mouth: The oropharynx appears normal. The tongue appears normal. There is normal oral moisture. There is no obvious gingivitis. Neck: There are no bruits present. The thyroid gland appears normal in size. The thyroid gland is smaller today at 20+ grams in size. The right lobe is normal in size. The left lobe is only slightly enlarged. The consistency of the thyroid gland is normal. There is no thyroid tenderness to palpation. Lungs: The lungs are clear. Air movement is  good. Heart: The heart rhythm and rate appear normal. Heart sounds S1 and S2 are normal. I do not appreciate any pathologic heart murmurs. Abdomen: The abdomen is enlarged. Bowel sounds are normal. The abdomen is soft and non-tender. There is no obviously palpable hepatomegaly, splenomegaly, or other masses.  Arms: Muscle mass appears appropriate for age.  Hands: She has a trace tremor. Phalangeal and metacarpophalangeal joints appear normal. Palms are normal. Legs: Muscle mass appears appropriate for age. There is no edema.  Feet: There are no significant deformities. Dorsalis pedis pulses are normal 2+ bilaterally.  Neurologic: Muscle strength is normal for age and gender  in both the upper and the lower extremities. Muscle tone appears normal. Sensation to touch is normal in the legs. Sensation to touch is intact in the feet.    LAB DATA:  03/17/16: CBG 145  Labs 12/31/15: HbA1c 13.4%, CBG 390; CMP normal except for glucose 344 and creatinine 1.14  Labs 10/08/15: HbA1c 9.6%  Labs 07/02/15: HbA1c 8.9%; PTH 107, calcium 9.2, 25-OH vitamin D 33; CBC normal; CMP normal except for creatinine 1.07; cholesterol 174, triglycerides 91, HDL 74, LDL 82; TSH 2.43, free T4 1.2, free T3 2.3  Labs 02/19/15: PTH 99, calcium 8.7, 25-OH vitamin D 20  Labs 10/08/14: HbA1c 8.0%   Labs 08/06/14: HbA1c 7.4%  Labs 08/01/14: TSH 2.014, free T4 0.87, free T3 2.4; PTH 79 (normal 14-64), calcium 9.1; 25-OH vitamin D 35 CMP normal  Labs 05/11/14: HbA1c was 7.7%, without any low BGs;  TSH 2.416, free T4 1.13, free T3 2.3  Labs 02/22/14: HbA1c 11.6%  Labs 02/13/14: Calcium 9.0, PTH 131, 25-OH vitamin D 15; CMP normal except for glucose of 165; cholesterol 177, triglycerides 108, HDL 90, LDL 65; urinary microalbumin/creatinine ratio 15.6; TSH 3.639, free T4 1.30, free T3 2.5  Labs 10/10/13: HbA1c is 9.4%, compared with 8.1% at last visit and with 9.3% in December;   Labs 05/08/13: HbA1c 8.1%; PTH 90.2, calcium 9.5,  25-hydroxy vitamin D 46; CMP normal except for glucose of 215; WBC 8.0, RBC 5.20, Hgb 12.5, Hct 37.8%, MCV 72.7 (normal 78-100), iron 55  Labs 02/23/13: CMP normal, except glucose 194 and alkaline phosphatase 126; microalbumin/creatinine ratio was 11.5; TSH 2.086, free T4 1.29, free T3 3.2, TPO antibody < 10; 25-hydroxy vitamin D 18; C-peptide 1.79; cholesterol 156, triglycerides 64, HDL 61, LDL 82  Labs 01/05/13: Hemoglobin A1c was 9.3%, compared with 9.3 at last visit and with 9.1% at the visit prior. All of these values were major increases from 6.6% in May 2012.    Labs 04/14/12: 25-hydroxy vitamin D 21, iron 203,  Vitamin B12 379,   Labs 11/16/11: Hgb 10.3, Hct 32.4%; CMP normal except glucose 146' cholesterol 201, triglycerides 70, HDL 66, LDL 121; vitamin D 22           Labs 06/09/10: Cholesterol was 175, triglycerides 76, HDL 62, LDL 98. TSH was 1.217. Free T4 was 1.10. Free T3 was 2.7. 25-hydroxy vitamin D was 19. PTH was 167.   Assessment and Plan:   ASSESSMENT:  1. Type 2 diabetes mellitus:   A. Her T2DM was out of control at her last visit due to her not taking medications reliably and to not exercising. Her HbA1c of 13.4% was probably the highest that it has ever been.    B. Today her CBG is 145, much improved.  2. Hypoglycemia: She had one episode of hypoglycemia symptoms several weeks ago.   3. Obesity: Her weight has decreased 10 pounds. The Contrave, Victoza, and her other medications are helping her. She needs to continue to Eat Right. It would also be good for her to exercise daily.   4-5. Autonomic neuropathy and tachycardia: The patient's autonomic neuropathy and  heart rate improved as a result of her lower HbA1c at a prior visit. Unfortunately, all of her autonomic parameters will worsen again unless she continues to take action to control her BGs.   6. Hypertension: Her BP is better since resuming her medications.  7-8. Anhedonia and anxiety: She is doing better. She needs  to continue to take her medications. She also needs to call for help when she begins to do poorly. She also needs to continue to find ways to have more joy in her life. 9-12. GERD/esophagitis/difficulty swallowing/gastroparesis: Her reflux and other GI symptoms have improved.  13-15: Hypocalcemia, vitamin D deficiency, and secondary hyperparathyroidism:   A. Due to her bariatric surgery she has more difficulty absorbing vitamin D and calcium. When she develops hypocalcemia and vitamin D deficiency, her parathyroid glands appropriated produce more PTH, resulting in secondary hyperparathyroidism. However, if she takes her calcium and vitamin D all three parameters will normalize.   B. Her vitamin D level was normal in June 2017. Unfortunately, she missed too many doses of calcium, resulting in higher PTH levels.  84. Noncompliance: Since last visit her compliance with eating right and taking her medications has improved. I am thrilled. 17: Muscle cramps: Resolved. Her potasium and calcium were normal in December  2017.    PLAN:  1. Diagnostic: TFTs, CMP, urinary microalbumin/creatinine ratio, calcium PTH, vitamin D, and lipid panel prior to her next visit. Consider ordering the [facility] for her.  2. Therapeutic: Continue medications as currently prescribed. Consider ordering [employer], 50,000 IU per week if her current meds are not adequate. Try to fit in exercise daily.  See Ms. Showfety in follow up.   3. Patient education: We discussed all of the above at great length. She must continue to take her medications and supplements.  4. Follow-up: 2 months   Level of Service: This visit lasted in excess of 55 minutes. More than 50% of the visit was devoted to counseling.   Tillman Sers, MD, CDE Adult and Pediatric Endocrinology 03/17/2016 11:00 AM

## 2016-03-17 NOTE — Patient Instructions (Signed)
Follow up visit in 2 months. Please repeat fasting lab tests 1-2 weeks prior.

## 2016-03-26 MED FILL — SYNTHROID 25 MCG TABLET: 25 | 90 days supply | Qty: 90 | Fill #0

## 2016-04-02 DIAGNOSIS — F39 Unspecified mood [affective] disorder: Secondary | ICD-10-CM | POA: Diagnosis not present

## 2016-04-21 MED FILL — PANTOPRAZOLE SOD DR 40 MG T: 40 | 90 days supply | Qty: 90 | Fill #2

## 2016-04-21 MED FILL — TELMISARTAN 40 MG TABLET: 40 | 90 days supply | Qty: 90 | Fill #0

## 2016-04-21 MED FILL — ESCITALOPRAM 20 MG TABLET: 20 | 90 days supply | Qty: 90 | Fill #1

## 2016-04-21 MED FILL — VICTOZA 18 MG/3 ML INJECT P: 18 | 90 days supply | Qty: 27 | Fill #1

## 2016-04-21 MED FILL — metFORMIN HCL 500 MG TABS: 500 | 90 days supply | Qty: 180 | Fill #0

## 2016-04-21 MED FILL — CONTRAVE ER 8-90 MG TABLET: 8-90 | 30 days supply | Qty: 120 | Fill #2

## 2016-05-07 DIAGNOSIS — F39 Unspecified mood [affective] disorder: Secondary | ICD-10-CM | POA: Diagnosis not present

## 2016-05-15 DIAGNOSIS — F39 Unspecified mood [affective] disorder: Secondary | ICD-10-CM | POA: Diagnosis not present

## 2016-05-27 ENCOUNTER — Other Ambulatory Visit (INDEPENDENT_AMBULATORY_CARE_PROVIDER_SITE_OTHER): Payer: Self-pay | Admitting: *Deleted

## 2016-05-27 ENCOUNTER — Telehealth (INDEPENDENT_AMBULATORY_CARE_PROVIDER_SITE_OTHER): Payer: Self-pay | Admitting: "Endocrinology

## 2016-05-27 ENCOUNTER — Other Ambulatory Visit (INDEPENDENT_AMBULATORY_CARE_PROVIDER_SITE_OTHER): Payer: Self-pay | Admitting: "Endocrinology

## 2016-05-27 ENCOUNTER — Encounter (INDEPENDENT_AMBULATORY_CARE_PROVIDER_SITE_OTHER): Payer: Self-pay | Admitting: "Endocrinology

## 2016-05-27 DIAGNOSIS — R102 Pelvic and perineal pain: Secondary | ICD-10-CM

## 2016-05-27 DIAGNOSIS — E559 Vitamin D deficiency, unspecified: Secondary | ICD-10-CM | POA: Diagnosis not present

## 2016-05-27 DIAGNOSIS — E038 Other specified hypothyroidism: Secondary | ICD-10-CM | POA: Diagnosis not present

## 2016-05-27 DIAGNOSIS — E1165 Type 2 diabetes mellitus with hyperglycemia: Secondary | ICD-10-CM | POA: Diagnosis not present

## 2016-05-27 LAB — T4, FREE: Free T4: 1.1 ng/dL (ref 0.8–1.8)

## 2016-05-27 LAB — T3, FREE: T3 FREE: 2.3 pg/mL (ref 2.3–4.2)

## 2016-05-27 LAB — TSH: TSH: 2.62 mIU/L

## 2016-05-27 NOTE — Telephone Encounter (Signed)
Patient called at 11:50 AM indicating she had ask that urine dipstick be performed at Dr. Loren Racer office. She was asking if antibiotic could be called in. Explained to her I was here now in could certainly do urine dipstick and check her. 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  Says she can come later in the afternoon. Explained to her I would not be in the office this afternoon.  Recommended urgent care if symptoms persist.

## 2016-05-27 NOTE — Telephone Encounter (Signed)
  Who's calling (name and relationship to patient) : Alexa [name], self  Best contact number: [phone]  Provider they see: Dr. Tobe Sos  Reason for call: Alexa [name] called in regarding the urine sample she gave today.  She is wanting to know if a UA can be added to tests.  She is stating that she is starting to hurt now.  Alexa [name] can be reached at [phone].    PRESCRIPTION REFILL ONLY  Name of prescription:  Pharmacy:

## 2016-05-27 NOTE — Telephone Encounter (Signed)
1. Ms Perine called. She had her lab work done this morning. She said that she has been having more suprapubic pain c/w her prior UTIs. She asked me to put in an order for a urine C&S.  2. I did put in that order. Tillman Sers

## 2016-05-28 LAB — COMPREHENSIVE METABOLIC PANEL
ALBUMIN: 4.1 g/dL (ref 3.6–5.1)
ALT: 12 U/L (ref 6–29)
AST: 16 U/L (ref 10–35)
Alkaline Phosphatase: 85 U/L (ref 33–130)
BILIRUBIN TOTAL: 0.6 mg/dL (ref 0.2–1.2)
BUN: 16 mg/dL (ref 7–25)
CHLORIDE: 102 mmol/L (ref 98–110)
CO2: 20 mmol/L (ref 20–31)
CREATININE: 1.09 mg/dL — AB (ref 0.50–1.05)
Calcium: 9.2 mg/dL (ref 8.6–10.4)
Glucose, Bld: 161 mg/dL — ABNORMAL HIGH (ref 70–99)
Potassium: 4.9 mmol/L (ref 3.5–5.3)
SODIUM: 137 mmol/L (ref 135–146)
TOTAL PROTEIN: 6.7 g/dL (ref 6.1–8.1)

## 2016-05-28 LAB — MICROALBUMIN / CREATININE URINE RATIO
Creatinine, Urine: 145 mg/dL (ref 20–320)
MICROALB UR: 0.8 mg/dL
MICROALB/CREAT RATIO: 6 ug/mg{creat} (ref <30)

## 2016-05-28 LAB — LIPID PANEL
CHOLESTEROL: 187 mg/dL (ref <200)
HDL: 74 mg/dL (ref >50)
LDL CALC: 98 mg/dL (ref <100)
TRIGLYCERIDES: 77 mg/dL (ref <150)
Total CHOL/HDL Ratio: 2.5 Ratio (ref <5.0)
VLDL: 15 mg/dL (ref <30)

## 2016-05-28 LAB — PTH, INTACT AND CALCIUM
CALCIUM: 9.2 mg/dL (ref 8.6–10.4)
PTH: 112 pg/mL — AB (ref 14–64)

## 2016-05-28 LAB — VITAMIN D 25 HYDROXY (VIT D DEFICIENCY, FRACTURES): VIT D 25 HYDROXY: 33 ng/mL (ref 30–100)

## 2016-05-31 LAB — CULTURE, URINE COMPREHENSIVE

## 2016-06-04 ENCOUNTER — Ambulatory Visit (INDEPENDENT_AMBULATORY_CARE_PROVIDER_SITE_OTHER): Payer: 59 | Admitting: "Endocrinology

## 2016-06-04 ENCOUNTER — Encounter (INDEPENDENT_AMBULATORY_CARE_PROVIDER_SITE_OTHER): Payer: Self-pay | Admitting: "Endocrinology

## 2016-06-04 VITALS — BP 132/76 | HR 90 | Wt 199.0 lb

## 2016-06-04 DIAGNOSIS — E310 Autoimmune polyglandular failure: Secondary | ICD-10-CM

## 2016-06-04 DIAGNOSIS — E211 Secondary hyperparathyroidism, not elsewhere classified: Secondary | ICD-10-CM

## 2016-06-04 DIAGNOSIS — Z794 Long term (current) use of insulin: Secondary | ICD-10-CM

## 2016-06-04 DIAGNOSIS — F329 Major depressive disorder, single episode, unspecified: Secondary | ICD-10-CM

## 2016-06-04 DIAGNOSIS — F32A Depression, unspecified: Secondary | ICD-10-CM

## 2016-06-04 DIAGNOSIS — K219 Gastro-esophageal reflux disease without esophagitis: Secondary | ICD-10-CM

## 2016-06-04 DIAGNOSIS — E559 Vitamin D deficiency, unspecified: Secondary | ICD-10-CM

## 2016-06-04 DIAGNOSIS — E049 Nontoxic goiter, unspecified: Secondary | ICD-10-CM | POA: Diagnosis not present

## 2016-06-04 DIAGNOSIS — E11649 Type 2 diabetes mellitus with hypoglycemia without coma: Secondary | ICD-10-CM

## 2016-06-04 DIAGNOSIS — E063 Autoimmune thyroiditis: Secondary | ICD-10-CM

## 2016-06-04 DIAGNOSIS — I10 Essential (primary) hypertension: Secondary | ICD-10-CM

## 2016-06-04 DIAGNOSIS — F39 Unspecified mood [affective] disorder: Secondary | ICD-10-CM | POA: Diagnosis not present

## 2016-06-04 DIAGNOSIS — E1110 Type 2 diabetes mellitus with ketoacidosis without coma: Secondary | ICD-10-CM

## 2016-06-04 LAB — POCT GLUCOSE (DEVICE FOR HOME USE): POC Glucose: 115 mg/dl — AB (ref 70–99)

## 2016-06-04 LAB — POCT GLYCOSYLATED HEMOGLOBIN (HGB A1C): Hemoglobin A1C: 8.1

## 2016-06-04 MED FILL — CONTRAVE ER 8-90 MG TABLET: 8-90 | 30 days supply | Qty: 120 | Fill #0

## 2016-06-04 NOTE — Patient Instructions (Signed)
Follow up visit in 2 months. Try to exercise at least 5 hors per week.

## 2016-06-04 NOTE — Progress Notes (Signed)
Subjective:  Patient Name: Alexa [name] Date of Birth: Dec 08, 1960  MRN: [id number]  Alexa [name]  presents to the office today for follow-up of her type 2 diabetes mellitus, obesity, combined hyperlipidemia, GERD, hypertension, ASHD, dyspepsia, pedal edema, non-proliferative diabetic retinopathy, goiter, depression, autonomic neuropathy, tachycardia, peripheral neuropathy, vitamin D deficiency, hypocalcemia, secondary hyperparathyroidism, glossitis, pallor, fatigue, iron deficiency anemia, anhedonia, disinclination to take medicines, and status post Roux-en-Y gastric bypass.   HISTORY OF PRESENT ILLNESS:   [name] is a 56 y.o. Caucasian woman. Alexa [name] was unaccompanied.  1. The patient first presented to me on 08/20/04 in referral from her primary care internist, Dr. Emeline General, for evaluation and management of type 2 diabetes, obesity, and multiple medical issues. She was 56 years old.   [name] had a long history of obesity. She was heavy as a child. She underwent menarche at age 87. At age 44 she was 180 pounds. In 1996 she weighed 218 pounds. In 1999 she was diagnosed with type 2 diabetes mellitus. Her weight at that point was 250 pounds. She was treated with Glucophage, and Actos. Actos made her gain even more weight. She had also been treated with glipizide in the past. More recently she had been treated with Lantus and Glucophage plus regular insulin as needed, especially for when she took steroids for asthma attacks. Maximum weight had been 296 pounds one month prior to that first visit with me. Her tendency to gain weight and her difficulty in losing weight were aggravated by long-standing, intermittent depression and by severe, recurrent asthma requiring the use of steroid medications.  B. Past medical history was also positive for a 60% blockage in one of her coronary arteries. She had significant issues with GERD and dyspepsia. She also had combined hyperlipidemia. She had a previous  cardiac catheterization and previous tonsillectomy. She was allergic to theophylline. Her pertinent review of systems was positive for some numbness and tingling in her feet. Family history was positive for type 2 diabetes in her father and her maternal grandmother. Her brother weighed 250 pounds at a height of 76 inches. Both her mother and her sister were hypothyroid.  C. On physical examination, her weight was 279.9 pounds. Her BMI was 49.6. Her blood pressure was 142/86. Her heart rate was 96. Her hemoglobin A1c was 9.8%. She was alert and oriented x3. Her affect was normal. Her insight was fair. She was obviously quite obese. She had a 25 gm thyroid gland. She had 1+ tremor of her hands. She had 1+ DP pulses and 2+ tinea pedis. Sensation to touch was intact in her feet. Laboratory data included a normal CMP. Her cholesterol was 175, triglycerides 76, HDL 53, and LDL 107. Her TSH was 2.98. Since she obviously did have type 2 diabetes mellitus associated with morbid obesity, and since weight loss was a major factor for her, I asked her to resume her metformin twice daily. I also started her on Byetta, initially 5 mcg twice daily and then 10 mcg twice daily. I discontinued her Lantus insulin.  2. During the last 12 years,we have had some major successes, some failures, and some new problem areas.  A. T2DM: In October 2007 the combination of Byetta and metformin was causing more gastrointestinal problems. Patient opted to stop the metformin and continue the Byetta because it was helping her with weight control and blood sugar control. By 08/11/06 her weight had decreased to 267.6 pounds. Hemoglobin A1c was 8.6%. At that point she decided  to have bariatric surgery. She had a Roux-en-Y gastric bypass on 12/20/2006. She subsequently lost weight down to 173.4 pounds on 05/23/08, but then subsequently regained weight to the 190s. Her  hemoglobin A1c values varied in parallel with her weights. On 05/23/08, at the point  of her lowest weight, her hemoglobin A1c dropped to a nadir of 6.3%.  Since then her hemoglobin A1c values have varied between 6.6 and 13.4%. Although we were initially able to stop all of her diabetes medicines after bariatric surgery, when she began to regain weight we restarted metformin, 500 mg twice daily. Although she took her metformin twice daily for a long time, she had discontinued it many months ago. She hated to take medicines and could not make herself be compliant in taking medications or exercising. When her HbA1c increased to 9.4% in September 2015, I stopped her Byetta injections and started her on liraglutide (Victoza) daily injections on 11/16/13.  B. Vitamin D deficiency, hypocalcemia, and secondary hyperparathyroidism: Just before her gastric bypass, I obtained baseline bone mineral metabolism studies. Her 25-hydroxy vitamin D was very low at 7 (normal greater than or equal to 30). Her calcium was 9.5 (normal 8.6-10.6). Her parathyroid hormone was 38.4 (normal 14-72). Subsequent to her surgery, the patient was supposed to be taking multivitamins with calcium and vitamin D, but did not always do so. On 03/29/2007 her 25-hydroxy vitamin D was 29, but her calcium decreased to 8.7. Her PTH was slightly elevated at 73.5. Her 1,25-hydroxy vitamin D was 46. Her iron was 56. I asked her to make sure she took her multivitamins and calcium daily. Unfortunately, she has often been non-compliant with her multivitamins and calcium. Her 25-hydroxy vitamin D values have varied between 15-35. Her calcium values have varied between 8.1-9.4. Her PTH values have remained elevated between 79-167. The patient's iron levels have also been low, between 32-34. She is supposed to be taking iron every day as well. Unfortunately, in August of 2014 she discontinued all vitamins and supplements.   C. The patient has had psychological issues for many years that have adversely affected her eating, her unwillingness to  exercise, her noncompliance with taking medications, her weight, and her T2DM care. Fortunately, her recent outpatient psychological therapy with Ms. Rea College, RN, MS has often resulted in Miston having a much more positive attitude about life in general and a generally more positive attitude about taking her medications.  3. The patient's last [facility] visit was on 03/17/16.   A. In the interim she resumed taking all of her medications consistently for the past 2-3 weeks. Physically she is "a little tired" due to her heavy work schedule, but otherwise "OK". Her mother-in-law died in the interim. Now there are problems with her bother-in-law who is unemployed and is a very dependent person. That situation is putting a strain on her marriage.   B. She also not had much of a problem with asthma.    C. She has been sleeping "fairly well", but does have some early awakening. Night sweats are less frequent.   D. She saw Ms. Showfety earlier today. These sessions have been "very helpful".     E. She is still drinking several regular Cokes per week. She has not been exercising.   F. She is taking Lexapro, 20 mg/day; Synthroid, 25 mcg/day; Micardis, 40 mg/day; Victoza,1.8 mg/day; Protonix, 40 mg/day; metformin 500 mg, twice daily; iron, three times per week; calcium carbonate with vitamin D, one tablet at lunch and two tablets  in the evenings; MVI. She is also taking Contrave, which has caused both a decrease in her appetite and early satiety.  4. Review of Systems: There are no other significant issues  Constitutional: The patient feels "good".   Eyes: Vision is fairly good. There are no significant eye complaints. The last eye exam that I am aware of occurred in the Spring of 2014. I have asked her to obtain a follow up eye exam, but she has not yet scheduled an exam.  Neck: The patient has no complaints of anterior neck swelling, soreness, tenderness, pressure, or discomfort.  Heart: She has some chest  pain when she began the Contrave too rapidly. After reducing the dose and ramping up the dose more slowly, there were no further pains. Heart rate increases with exercise or other physical activity. The patient has no complaints of palpitations, irregular heat beats, chest pain, or chest pressure. Gastrointestinal: The patient tends to be constipated. She has not had any post-prandial bloating recently. She does not have any excessive hunger, acid reflux, upset stomach, stomach aches or pains, or diarrhea. Legs: She still has occasional nocturnal calf cramps. Muscle mass and strength seem normal. There are no new complaints of numbness, tingling, or burning. No edema is noted. Feet: She has not been having nocturnal foot cramps. There are no new complaints of numbness, tingling, or burning. No edema is noted. Tinea improves when she uses her anti-fungal cream.  Neuro: No new sensory or muscular problems Psych: She feels that she is doing pretty well overall.    GU: She has been urinating a lot. She occasionally has nocturia.  Hypoglycemia: She has not had any low BG symptoms.   5. BG printout: She did not bring in her BG meter today. She has not been checking her BGs. I discussed the use of the [facility] [place] with her.   PAST MEDICAL, FAMILY, AND SOCIAL HISTORY:  Past Medical History:  Diagnosis Date  . Anemia, iron deficiency   . Asthma   . Asthma, chronic   . CAD (coronary artery disease)   . Chronic insomnia   . Combined hyperlipidemia   . Depression   . Diabetes mellitus   . Diabetes mellitus type II   . Diabetic autonomic neuropathy ([place])   . DM type 2 with diabetic peripheral neuropathy ([place])   . Dyspepsia   . Elevated homocysteine ([place])   . Essential tremor   . Fatigue   . GERD (gastroesophageal reflux disease)   . GERD (gastroesophageal reflux disease)   . Glossitis   . Goiter   . Hyperparathyroidism , secondary, non-renal ([place])   . Hypertension   . Nonproliferative  diabetic retinopathy associated with type 2 diabetes mellitus ([place])    RESOLVED  . Obesity, Class III, BMI 40-49.9 (morbid obesity) ([place])   . Pallor   . Tachycardia   . Thyroiditis, autoimmune   . Vitamin D deficiency     Family History  Problem Relation Age of Onset  . Thyroid disease Mother        Hypothyroid  . Diabetes Father        T2 DM  . Obesity Brother        250 pounds at a height of 76 inches.  . Thyroid disease Sister        Hypothyroid  . Diabetes Maternal Grandmother   . Colon cancer Other        great grandfather  . Liver disease Maternal Aunt  NASH  . Esophageal cancer Neg Hx   . Pancreatic cancer Neg Hx   . Kidney disease Neg Hx      Current Outpatient Prescriptions:  .  Calcium Carbonate-Vitamin D (CALCIUM-VITAMIN D) 500-200 MG-UNIT per tablet, Take 1 tablet by mouth 2 (two) times daily with a meal. Reported on 02/26/2015, Disp: , Rfl:  .  escitalopram (LEXAPRO) 20 MG tablet, Take 20 mg by mouth daily. , Disp: , Rfl: 3 .  FERROUS SULFATE PO, Take 1 tablet by mouth daily., Disp: , Rfl:  .  levothyroxine (SYNTHROID, LEVOTHROID) 25 MCG tablet, Take one tablet daily., Disp: 90 tablet, Rfl: 3 .  metFORMIN (GLUCOPHAGE) 500 MG tablet, TAKE 1 TABLET BY MOUTH AT BREAKFAST AND ONE AT DINNER., Disp: 180 tablet, Rfl: 3 .  Multiple Vitamin (MULTIVITAMIN) tablet, Take 1 tablet by mouth daily. Reported on 02/26/2015, Disp: , Rfl:  .  Naltrexone-Bupropion HCl ER 8-90 MG TB12, Take by mouth daily., Disp: , Rfl:  .  pantoprazole (PROTONIX) 40 MG tablet, Take 1 tablet (40 mg total) by mouth daily., Disp: 90 tablet, Rfl: 4 .  telmisartan (MICARDIS) 40 MG tablet, TAKE 1 TABLET BY MOUTH EACH MORNING., Disp: 90 tablet, Rfl: 3 .  VICTOZA 18 MG/3ML SOPN, INJECT 1.8 MG DAILY, Disp: 27 mL, Rfl: PRN .  albuterol (PROVENTIL HFA;VENTOLIN HFA) 108 (90 Base) MCG/ACT inhaler, Inhale 2 puffs into the lungs every 6 (six) hours as needed for wheezing or shortness of breath. (Patient not  taking: Reported on 01/14/2016), Disp: 1 Inhaler, Rfl: 11 .  albuterol (PROVENTIL) (2.5 MG/3ML) 0.083% nebulizer solution, Take 3 mLs (2.5 mg total) by nebulization every 6 (six) hours as needed for wheezing or shortness of breath. (Patient not taking: Reported on 01/14/2016), Disp: 150 mL, Rfl: 1 .  benzonatate (TESSALON) 100 MG capsule, Take 1 capsule (100 mg total) by mouth every 8 (eight) hours. (Patient not taking: Reported on 01/14/2016), Disp: 21 capsule, Rfl: 0 .  cephALEXin (KEFLEX) 500 MG capsule, Take 1 capsule (500 mg total) by mouth 3 (three) times daily. (Patient not taking: Reported on 01/14/2016), Disp: 21 capsule, Rfl: 0 .  conjugated estrogens (PREMARIN) vaginal cream, Place 1 Applicatorful vaginally daily. (Patient not taking: Reported on 06/04/2016), Disp: 42.5 g, Rfl: 11 .  cyclobenzaprine (FLEXERIL) 10 MG tablet, Take 1 tablet (10 mg total) by mouth 3 (three) times daily as needed for muscle spasms. (Patient not taking: Reported on 03/17/2016), Disp: 90 tablet, Rfl: 1 .  diphenhydrAMINE (BENADRYL) 25 MG tablet, Take 25-50 mg by mouth 2 (two) times daily as needed for allergies or sleep., Disp: , Rfl:  .  levofloxacin (LEVAQUIN) 500 MG tablet, Take 1 tablet (500 mg total) by mouth daily. (Patient not taking: Reported on 06/04/2016), Disp: 10 tablet, Rfl: 0 .  meloxicam (MOBIC) 15 MG tablet, Take 1 tablet (15 mg total) by mouth daily. (Patient not taking: Reported on 03/17/2016), Disp: 90 tablet, Rfl: 0 .  predniSONE (DELTASONE) 10 MG tablet, Take in tapering course as directed 6-5-4-3-2-1 (Patient not taking: Reported on 06/04/2016), Disp: 21 tablet, Rfl: 0 .  promethazine (PHENERGAN) 25 MG tablet, Take 1 tablet (25 mg total) by mouth every 6 (six) hours as needed for nausea or vomiting. (Patient not taking: Reported on 01/14/2016), Disp: 12 tablet, Rfl: 0 .  traMADol (ULTRAM) 50 MG tablet, Take 1 tablet (50 mg total) by mouth every 8 (eight) hours as needed. (Patient not taking: Reported  on 03/17/2016), Disp: 30 tablet, Rfl: 0  Allergies as of  06/04/2016 - Review Complete 06/04/2016  Allergen Reaction Noted  . Theophyllines Palpitations 05/13/2010    1. 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 2. Activities: She has not done much physical activity. She and her husband often visit [place] the [facility] in [place], [place], which they enjoy.   3. Smoking, alcohol, or drugs: None 4. Primary Care Provider: Dr. Tommie Ard Baxley 5. Therapist: Ms. Rea College, MS  REVIEW OF SYSTEM: There are no other significant problems involving Bessy's other body systems.   Objective:  Vital Signs:  BP 132/76   Pulse 90   Wt 199 lb (90.3 kg)   LMP 05/11/2010   BMI 34.61 kg/m    Ht Readings from Last 3 Encounters:  03/17/16 5' 3.58" (1.615 m)  01/14/16 5\' 4"  (1.626 m)  08/20/15 5\' 4"  (1.626 m)   Wt Readings from Last 3 Encounters:  06/04/16 199 lb (90.3 kg)  03/17/16 202 lb 12.8 oz (92 kg)  01/14/16 209 lb (94.8 kg)   PHYSICAL EXAM:  Constitutional:  The patient looks healthy and alert, but also looks somewhat tired today. She is wearing a skirt and blouse, make up, and lipstick today. Her insight is normal. Her affect is normal and fairly upbeat. She has lost 3 more pounds since her last visit.  Eyes: There is no arcus or proptosis.  Mouth: The oropharynx appears normal. The tongue appears normal. There is normal oral moisture. There is no obvious gingivitis. Neck: There are no bruits present. The thyroid gland appears normal in size. The thyroid gland is smaller today at 20 grams in size. The thyroid gland has shrunk back to normal size. The consistency of the thyroid gland is normal. There is no thyroid tenderness to palpation. Lungs: The lungs are clear. Air movement is good. Heart: The heart rhythm and rate appear normal. Heart  sounds S1 and S2 are normal. I do not appreciate any pathologic heart murmurs. Abdomen: The abdomen is enlarged. Bowel sounds are normal. The abdomen is soft and non-tender. There is no obviously palpable hepatomegaly, splenomegaly, or other masses.  Arms: Muscle mass appears appropriate for age.  Hands: She has a trace tremor. Phalangeal and metacarpophalangeal joints appear normal. Palms are normal. Legs: Muscle mass appears appropriate for age. There is no edema.  Feet: There are no significant deformities. Dorsalis pedis pulses are normal 1+ bilaterally. She has 2-3+ tinea in her great toenails.  Neurologic: Muscle strength is normal for age and gender  in both the upper and the lower extremities. Muscle tone appears normal. Sensation to touch is normal in the legs and feet.     LAB DATA:  06/04/16: HBA1c 8.1%, CBG 115  05/27/16: TSH 2.62, free T4 1.1, free T3 2.3; CMP normal except for glucose 161, and creatinine 1.09; cholesterol 187, triglycerides 77, HDL 74, LDL 98; urinary microalbumin/creatinine ratio 6; PTH 112, calcium 9.2, 25-OH vitamin D 33  03/17/16: CBG 145  Labs 12/31/15: HbA1c 13.4%, CBG 390; CMP normal except for glucose 344 and creatinine 1.14  Labs 10/08/15: HbA1c 9.6%  Labs 07/02/15: HbA1c 8.9%; PTH 107, calcium 9.2, 25-OH vitamin D 33; CBC normal; CMP normal except for creatinine 1.07; cholesterol 174, triglycerides 91, HDL 74, LDL 82; TSH 2.43, free T4 1.2, free T3 2.3  Labs 02/19/15: PTH 99, calcium 8.7, 25-OH vitamin D 20  Labs 10/08/14: HbA1c 8.0%   Labs 08/06/14: HbA1c 7.4%  Labs 08/01/14: TSH 2.014, free T4 0.87, free T3 2.4; PTH 79 (normal 14-64), calcium 9.1; 25-OH vitamin D 35 CMP normal  Labs 05/11/14: HbA1c was 7.7%, without any low BGs; TSH 2.416, free T4 1.13, free T3 2.3  Labs 02/22/14: HbA1c 11.6%  Labs 02/13/14: Calcium 9.0, PTH 131, 25-OH vitamin D 15; CMP normal except for glucose of 165; cholesterol 177, triglycerides 108, HDL 90, LDL 65; urinary  microalbumin/creatinine ratio 15.6; TSH 3.639, free T4 1.30, free T3 2.5  Labs 10/10/13: HbA1c is 9.4%, compared with 8.1% at last visit and with 9.3% in December;   Labs 05/08/13: HbA1c 8.1%; PTH 90.2, calcium 9.5, 25-hydroxy vitamin D 46; CMP normal except for glucose of 215; WBC 8.0, RBC 5.20, Hgb 12.5, Hct 37.8%, MCV 72.7 (normal 78-100), iron 55  Labs 02/23/13: CMP normal, except glucose 194 and alkaline phosphatase 126; microalbumin/creatinine ratio was 11.5; TSH 2.086, free T4 1.29, free T3 3.2, TPO antibody < 10; 25-hydroxy vitamin D 18; C-peptide 1.79; cholesterol 156, triglycerides 64, HDL 61, LDL 82  Labs 01/05/13: Hemoglobin A1c was 9.3%, compared with 9.3 at last visit and with 9.1% at the visit prior. All of these values were major increases from 6.6% in May 2012.    Labs 04/14/12: 25-hydroxy vitamin D 21, iron 203,  Vitamin B12 379,   Labs 11/16/11: Hgb 10.3, Hct 32.4%; CMP normal except glucose 146' cholesterol 201, triglycerides 70, HDL 66, LDL 121; vitamin D 22           Labs 06/09/10: Cholesterol was 175, triglycerides 76, HDL 62, LDL 98. TSH was 1.217. Free T4 was 1.10. Free T3 was 2.7. 25-hydroxy vitamin D was 19. PTH was 167.   Assessment and Plan:   ASSESSMENT:  1. Type 2 diabetes mellitus:   A. Her T2DM was out of control at her December 2017 visit due to her not taking medications reliably and to not exercising. Her HbA1c of 13.4% was probably the highest that it has ever been.    B. Today her HbA1c has decreased to 8.0%. Her BG control improves dramatically when she takes her medications and watches her carb intake. Contrave is helping.  2. Hypoglycemia: She has not had any hypoglycemia symptoms..   3. Obesity: Her weight has decreased 10 pounds since December 2017. The Contrave, Victoza, and her other medications are helping her. She needs to continue to Eat Right. It would also be good for her to exercise daily.   4-5. Autonomic neuropathy and tachycardia: The  patient's autonomic neuropathy and  heart rate improved as a result of her lower HbA1c.   6. Hypertension: Her BP is better since resuming her medications. She still needs to try to fit in exercise.  7-8. Anhedonia and anxiety: She is doing better. She needs to continue to take her medications. She also needs to call for help when she begins to do poorly. She also needs to continue to find ways to have more joy in her life. 9-12. GERD/esophagitis/difficulty swallowing/gastroparesis: Her reflux and other GI symptoms have improved.  13-15: Hypocalcemia, vitamin D deficiency, and secondary hyperparathyroidism:   A. Due to her bariatric surgery she has more difficulty absorbing vitamin D and calcium. When she develops hypocalcemia and vitamin D deficiency, her parathyroid glands appropriated produce more PTH, resulting in secondary hyperparathyroidism. However, if she takes her calcium and vitamin D all three parameters will normalize.   B. Her vitamin D level was normal  in May 2018. Unfortunately, she missed too many doses of calcium, resulting in higher PTH levels. Adding two Tums at bedtime would help.  37. Noncompliance: Since last visit her compliance with eating right and taking her medications has improved. I am happy.  17: Muscle cramps: Still active.  Her potassium and calcium were normal in May 2018. She may be more dehydrated at times.     PLAN:  1. Diagnostic: Consider ordering the [facility] for her. I asked her to contact our diabetes educator, Ms. Sherrlyn Hock, to set up a meeting to discuss the [place]. 2. Therapeutic: Continue medications as currently prescribed. Consider ordering [employer], 50,000 IU per week to improve her vitamin D level. Try to fit in exercise daily.  See Ms. Showfety in follow up.   3. Patient education: We discussed all of the above at great length. She must continue to take her medications and supplements. She remains very grateful for all the support we have given her.   4. Follow-up: 2 months   Level of Service: This visit lasted in excess of 55 minutes. More than 50% of the visit was devoted to counseling.   Tillman Sers, MD, CDE Adult and Pediatric Endocrinology 06/04/2016 10:39 AM

## 2016-06-23 ENCOUNTER — Other Ambulatory Visit: Payer: 59 | Admitting: Internal Medicine

## 2016-06-25 ENCOUNTER — Encounter: Payer: 59 | Admitting: Internal Medicine

## 2016-07-13 ENCOUNTER — Encounter: Payer: 59 | Admitting: Internal Medicine

## 2016-07-14 ENCOUNTER — Other Ambulatory Visit: Payer: Self-pay | Admitting: Internal Medicine

## 2016-07-14 DIAGNOSIS — Z1231 Encounter for screening mammogram for malignant neoplasm of breast: Secondary | ICD-10-CM

## 2016-07-22 ENCOUNTER — Ambulatory Visit
Admission: RE | Admit: 2016-07-22 | Discharge: 2016-07-22 | Disposition: A | Discharge status: Home / Self Care | Payer: 59 | Source: Ambulatory Visit | Attending: Internal Medicine | Admitting: Internal Medicine

## 2016-07-22 DIAGNOSIS — Z1231 Encounter for screening mammogram for malignant neoplasm of breast: Secondary | ICD-10-CM | POA: Diagnosis not present

## 2016-08-11 ENCOUNTER — Encounter: Payer: 59 | Admitting: Internal Medicine

## 2016-09-01 ENCOUNTER — Ambulatory Visit (INDEPENDENT_AMBULATORY_CARE_PROVIDER_SITE_OTHER): Payer: 59 | Admitting: "Endocrinology

## 2016-09-01 DIAGNOSIS — F39 Unspecified mood [affective] disorder: Secondary | ICD-10-CM | POA: Diagnosis not present

## 2016-10-01 DIAGNOSIS — F39 Unspecified mood [affective] disorder: Secondary | ICD-10-CM | POA: Diagnosis not present

## 2016-10-28 MED FILL — CYCLOBENZAPRINE 10 MG TAB: 10 | 30 days supply | Qty: 90 | Fill #1

## 2016-11-16 ENCOUNTER — Other Ambulatory Visit: Payer: 59 | Admitting: Internal Medicine

## 2016-11-16 DIAGNOSIS — F39 Unspecified mood [affective] disorder: Secondary | ICD-10-CM | POA: Diagnosis not present

## 2016-11-17 ENCOUNTER — Other Ambulatory Visit (INDEPENDENT_AMBULATORY_CARE_PROVIDER_SITE_OTHER): Payer: 59 | Admitting: Internal Medicine

## 2016-11-17 ENCOUNTER — Other Ambulatory Visit: Payer: 59 | Admitting: Internal Medicine

## 2016-11-17 DIAGNOSIS — Z0000 Encounter for general adult medical examination without abnormal findings: Secondary | ICD-10-CM | POA: Diagnosis not present

## 2016-11-17 DIAGNOSIS — E7800 Pure hypercholesterolemia, unspecified: Secondary | ICD-10-CM | POA: Diagnosis not present

## 2016-11-17 DIAGNOSIS — E119 Type 2 diabetes mellitus without complications: Secondary | ICD-10-CM | POA: Diagnosis not present

## 2016-11-17 DIAGNOSIS — I10 Essential (primary) hypertension: Secondary | ICD-10-CM | POA: Diagnosis not present

## 2016-11-18 LAB — COMPLETE METABOLIC PANEL WITH GFR
AG Ratio: 1.7 (calc) (ref 1.0–2.5)
ALBUMIN MSPROF: 4 g/dL (ref 3.6–5.1)
ALT: 13 U/L (ref 6–29)
AST: 14 U/L (ref 10–35)
Alkaline phosphatase (APISO): 118 U/L (ref 33–130)
BUN: 10 mg/dL (ref 7–25)
CALCIUM: 9.1 mg/dL (ref 8.6–10.4)
CO2: 28 mmol/L (ref 20–32)
CREATININE: 0.95 mg/dL (ref 0.50–1.05)
Chloride: 102 mmol/L (ref 98–110)
GFR, EST AFRICAN AMERICAN: 78 mL/min/{1.73_m2} (ref >=60)
GFR, EST NON AFRICAN AMERICAN: 67 mL/min/{1.73_m2} (ref >=60)
GLUCOSE: 314 mg/dL — AB (ref 65–99)
Globulin: 2.4 g/dL (calc) (ref 1.9–3.7)
Potassium: 5.2 mmol/L (ref 3.5–5.3)
Sodium: 138 mmol/L (ref 135–146)
Total Bilirubin: 0.5 mg/dL (ref 0.2–1.2)
Total Protein: 6.4 g/dL (ref 6.1–8.1)

## 2016-11-18 LAB — CBC WITH DIFFERENTIAL/PLATELET
BASOS ABS: 42 {cells}/uL (ref 0–200)
BASOS PCT: 0.9 %
Eosinophils Absolute: 61 cells/uL (ref 15–500)
Eosinophils Relative: 1.3 %
HCT: 41.4 % (ref 35.0–45.0)
Hemoglobin: 13.4 g/dL (ref 11.7–15.5)
Lymphs Abs: 1819 cells/uL (ref 850–3900)
MCH: 26.5 pg — AB (ref 27.0–33.0)
MCHC: 32.4 g/dL (ref 32.0–36.0)
MCV: 81.8 fL (ref 80.0–100.0)
MONOS PCT: 7 %
MPV: 9.7 fL (ref 7.5–12.5)
NEUTROS ABS: 2449 {cells}/uL (ref 1500–7800)
Neutrophils Relative %: 52.1 %
Platelets: 326 10*3/uL (ref 140–400)
RBC: 5.06 10*6/uL (ref 3.80–5.10)
RDW: 12.7 % (ref 11.0–15.0)
Total Lymphocyte: 38.7 %
WBC mixed population: 329 cells/uL (ref 200–950)
WBC: 4.7 10*3/uL (ref 3.8–10.8)

## 2016-11-18 LAB — HEMOGLOBIN A1C
HEMOGLOBIN A1C: 13.4 %{Hb} — AB (ref <5.7)
MEAN PLASMA GLUCOSE: 338 (calc)
eAG (mmol/L): 18.7 (calc)

## 2016-11-18 LAB — LIPID PANEL
Cholesterol: 173 mg/dL (ref <200)
HDL: 71 mg/dL (ref >50)
LDL Cholesterol (Calc): 83 mg/dL (calc)
NON-HDL CHOLESTEROL (CALC): 102 mg/dL (ref <130)
Total CHOL/HDL Ratio: 2.4 (calc) (ref <5.0)
Triglycerides: 91 mg/dL (ref <150)

## 2016-11-18 LAB — TSH: TSH: 1.54 m[IU]/L

## 2016-11-18 LAB — VITAMIN D 25 HYDROXY (VIT D DEFICIENCY, FRACTURES): Vit D, 25-Hydroxy: 21 ng/mL — ABNORMAL LOW (ref 30–100)

## 2016-11-24 ENCOUNTER — Ambulatory Visit (INDEPENDENT_AMBULATORY_CARE_PROVIDER_SITE_OTHER): Payer: 59 | Admitting: Internal Medicine

## 2016-11-24 ENCOUNTER — Telehealth: Payer: Self-pay

## 2016-11-24 ENCOUNTER — Encounter: Payer: Self-pay | Admitting: Internal Medicine

## 2016-11-24 ENCOUNTER — Ambulatory Visit (INDEPENDENT_AMBULATORY_CARE_PROVIDER_SITE_OTHER): Payer: 59 | Admitting: "Endocrinology

## 2016-11-24 ENCOUNTER — Encounter (INDEPENDENT_AMBULATORY_CARE_PROVIDER_SITE_OTHER): Payer: Self-pay | Admitting: "Endocrinology

## 2016-11-24 VITALS — BP 118/82 | HR 78 | Ht 63.39 in | Wt 204.0 lb

## 2016-11-24 VITALS — BP 136/84 | HR 86 | Temp 98.6°F | Ht 62.25 in | Wt 204.0 lb

## 2016-11-24 DIAGNOSIS — E11649 Type 2 diabetes mellitus with hypoglycemia without coma: Secondary | ICD-10-CM | POA: Diagnosis not present

## 2016-11-24 DIAGNOSIS — E7849 Other hyperlipidemia: Secondary | ICD-10-CM

## 2016-11-24 DIAGNOSIS — Z8709 Personal history of other diseases of the respiratory system: Secondary | ICD-10-CM

## 2016-11-24 DIAGNOSIS — E1143 Type 2 diabetes mellitus with diabetic autonomic (poly)neuropathy: Secondary | ICD-10-CM

## 2016-11-24 DIAGNOSIS — Z8659 Personal history of other mental and behavioral disorders: Secondary | ICD-10-CM | POA: Diagnosis not present

## 2016-11-24 DIAGNOSIS — E559 Vitamin D deficiency, unspecified: Secondary | ICD-10-CM

## 2016-11-24 DIAGNOSIS — IMO0001 Reserved for inherently not codable concepts without codable children: Secondary | ICD-10-CM

## 2016-11-24 DIAGNOSIS — Z9119 Patient's noncompliance with other medical treatment and regimen: Secondary | ICD-10-CM

## 2016-11-24 DIAGNOSIS — Z794 Long term (current) use of insulin: Secondary | ICD-10-CM | POA: Diagnosis not present

## 2016-11-24 DIAGNOSIS — Z6837 Body mass index (BMI) 37.0-37.9, adult: Secondary | ICD-10-CM

## 2016-11-24 DIAGNOSIS — E119 Type 2 diabetes mellitus without complications: Secondary | ICD-10-CM | POA: Diagnosis not present

## 2016-11-24 DIAGNOSIS — Z62810 Personal history of physical and sexual abuse in childhood: Secondary | ICD-10-CM | POA: Diagnosis not present

## 2016-11-24 DIAGNOSIS — K219 Gastro-esophageal reflux disease without esophagitis: Secondary | ICD-10-CM

## 2016-11-24 DIAGNOSIS — Z8739 Personal history of other diseases of the musculoskeletal system and connective tissue: Secondary | ICD-10-CM | POA: Diagnosis not present

## 2016-11-24 DIAGNOSIS — E1165 Type 2 diabetes mellitus with hyperglycemia: Secondary | ICD-10-CM | POA: Diagnosis not present

## 2016-11-24 DIAGNOSIS — E66813 Obesity, class 3: Secondary | ICD-10-CM

## 2016-11-24 DIAGNOSIS — R1312 Dysphagia, oropharyngeal phase: Secondary | ICD-10-CM

## 2016-11-24 DIAGNOSIS — Z0000 Encounter for general adult medical examination without abnormal findings: Secondary | ICD-10-CM

## 2016-11-24 DIAGNOSIS — R4584 Anhedonia: Secondary | ICD-10-CM | POA: Diagnosis not present

## 2016-11-24 DIAGNOSIS — N951 Menopausal and female climacteric states: Secondary | ICD-10-CM | POA: Diagnosis not present

## 2016-11-24 DIAGNOSIS — I10 Essential (primary) hypertension: Secondary | ICD-10-CM

## 2016-11-24 DIAGNOSIS — Z91199 Patient's noncompliance with other medical treatment and regimen due to unspecified reason: Secondary | ICD-10-CM

## 2016-11-24 DIAGNOSIS — E1110 Type 2 diabetes mellitus with ketoacidosis without coma: Secondary | ICD-10-CM

## 2016-11-24 DIAGNOSIS — R252 Cramp and spasm: Secondary | ICD-10-CM

## 2016-11-24 DIAGNOSIS — K5904 Chronic idiopathic constipation: Secondary | ICD-10-CM

## 2016-11-24 DIAGNOSIS — Z9884 Bariatric surgery status: Secondary | ICD-10-CM | POA: Diagnosis not present

## 2016-11-24 DIAGNOSIS — E063 Autoimmune thyroiditis: Secondary | ICD-10-CM

## 2016-11-24 LAB — POCT URINALYSIS DIPSTICK
Bilirubin, UA: NEGATIVE
Blood, UA: NEGATIVE
GLUCOSE UA: NEGATIVE
Ketones, UA: NEGATIVE
Leukocytes, UA: NEGATIVE
Nitrite, UA: NEGATIVE
Protein, UA: NEGATIVE
SPEC GRAV UA: 1.02 (ref 1.010–1.025)
UROBILINOGEN UA: 0.2 U/dL
pH, UA: 6.5 (ref 5.0–8.0)

## 2016-11-24 LAB — POCT GLUCOSE (DEVICE FOR HOME USE): POC Glucose: 468 mg/dl — AB (ref 70–99)

## 2016-11-24 MED ORDER — ESCITALOPRAM OXALATE 20 MG PO TABS: 20.0000 mg | ORAL_TABLET | Freq: Every day | ORAL | 1 refills | Status: DC

## 2016-11-24 MED ORDER — ESTROGENS, CONJUGATED 0.625 MG/GM VA CREA: 1.0000 | TOPICAL_CREAM | Freq: Every day | VAGINAL | 11 refills | Status: DC

## 2016-11-24 MED ORDER — ALBUTEROL SULFATE (2.5 MG/3ML) 0.083% IN NEBU: 2.5000 mg | INHALATION_SOLUTION | Freq: Four times a day (QID) | RESPIRATORY_TRACT | 1 refills | Status: DC | PRN

## 2016-11-24 MED ORDER — TRULICITY 0.75 MG/0.5ML ~~LOC~~ SOAJ: SUBCUTANEOUS | 6 refills | Status: DC

## 2016-11-24 MED ORDER — ALBUTEROL SULFATE HFA 108 (90 BASE) MCG/ACT IN AERS: 2.0000 | INHALATION_SPRAY | Freq: Four times a day (QID) | RESPIRATORY_TRACT | 11 refills | Status: DC | PRN

## 2016-11-24 MED ORDER — METFORMIN HCL 500 MG PO TABS: ORAL_TABLET | ORAL | 3 refills | Status: DC

## 2016-11-24 MED ORDER — TELMISARTAN 40 MG PO TABS: ORAL_TABLET | ORAL | 3 refills | Status: DC

## 2016-11-24 MED ORDER — CYCLOBENZAPRINE HCL 10 MG PO TABS: 10.0000 mg | ORAL_TABLET | Freq: Three times a day (TID) | ORAL | 0 refills | Status: DC | PRN

## 2016-11-24 MED ORDER — LEVOTHYROXINE SODIUM 25 MCG PO TABS: ORAL_TABLET | ORAL | 3 refills | Status: DC

## 2016-11-24 MED ORDER — TRAMADOL HCL 50 MG PO TABS: 50.0000 mg | ORAL_TABLET | Freq: Three times a day (TID) | ORAL | 1 refills | Status: DC | PRN

## 2016-11-24 MED FILL — CYCLOBENZAPRINE 10 MG TAB: 10 | 30 days supply | Qty: 90 | Fill #0

## 2016-11-24 MED FILL — SYNTHROID 25 MCG TABLET: 25 | 90 days supply | Qty: 90 | Fill #0

## 2016-11-24 MED FILL — traMADol HCL 50 MG TABS: 50 | 30 days supply | Qty: 90 | Fill #0

## 2016-11-24 MED FILL — TELMISARTAN 40 MG TABLET: 40 | 90 days supply | Qty: 90 | Fill #0

## 2016-11-24 MED FILL — ESCITALOPRAM 20 MG TABLET: 20 | 90 days supply | Qty: 90 | Fill #0

## 2016-11-24 NOTE — Progress Notes (Signed)
Subjective:  Patient Name: Alexa [name] Date of Birth: 07-17-1960  MRN: [id number]  Alexa [name]  presents to the office today for follow-up of her type 2 diabetes mellitus, obesity, combined hyperlipidemia, GERD, hypertension, ASHD, dyspepsia, pedal edema, non-proliferative diabetic retinopathy, goiter, depression, autonomic neuropathy, tachycardia, peripheral neuropathy, acquired hypothyroidism, vitamin D deficiency, hypocalcemia, secondary hyperparathyroidism, glossitis, pallor, fatigue, iron deficiency anemia, anhedonia, disinclination to take medicines, and status post Roux-en-Y gastric bypass.   HISTORY OF PRESENT ILLNESS:   Alexa [name] is a 56 y.o. Caucasian woman. Alexa [name] was unaccompanied.  1. The patient first presented to me on 08/20/04 in referral from her primary care internist, Dr. Emeline General, for evaluation and management of type 2 diabetes, obesity, and multiple medical issues. She was 56 years old.   [name] had a long history of obesity. She was heavy as a child. She underwent menarche at age 47. At age 34 she was 180 pounds. In 1996 she weighed 218 pounds. In 1999 she was diagnosed with type 2 diabetes mellitus. Her weight at that point was 250 pounds. She was treated with Glucophage, and Actos. Actos made her gain even more weight. She had also been treated with glipizide in the past. More recently she had been treated with Lantus and Glucophage plus regular insulin as needed, especially for when she took steroids for asthma attacks. Maximum weight had been 296 pounds one month prior to that first visit with me. Her tendency to gain weight and her difficulty in losing weight were aggravated by long-standing, intermittent depression and by severe, recurrent asthma requiring the use of steroid medications.  B. Past medical history was also positive for a 60% blockage in one of her coronary arteries. She had significant issues with GERD and dyspepsia. She also had combined  hyperlipidemia. She had a previous cardiac catheterization and previous tonsillectomy. She was allergic to theophylline. Her pertinent review of systems was positive for some numbness and tingling in her feet. Family history was positive for type 2 diabetes in her father and her maternal grandmother. Her brother weighed 250 pounds at a height of 76 inches. Both her mother and her sister were hypothyroid.  C. On physical examination, her weight was 279.9 pounds. Her BMI was 49.6. Her blood pressure was 142/86. Her heart rate was 96. Her hemoglobin A1c was 9.8%. She was alert and oriented x3. Her affect was normal. Her insight was fair. She was obviously quite obese. She had a 25 gm thyroid gland. She had 1+ tremor of her hands. She had 1+ DP pulses and 2+ tinea pedis. Sensation to touch was intact in her feet. Laboratory data included a normal CMP. Her cholesterol was 175, triglycerides 76, HDL 53, and LDL 107. Her TSH was 2.98. Since she obviously did have type 2 diabetes mellitus associated with morbid obesity, and since weight loss was a major factor for her, I asked her to resume her metformin twice daily. I also started her on Byetta, initially 5 mcg twice daily and then 10 mcg twice daily. I discontinued her Lantus insulin.  2. During the last 12 years,we have had some successes, some failures, and some new problem areas.  A. T2DM: In October 2007 the combination of Byetta and metformin was causing more gastrointestinal problems. Patient opted to stop the metformin and continue the Byetta because it was helping her with weight control and blood sugar control. By 08/11/06 her weight had decreased to 267.6 pounds. Hemoglobin A1c was 8.6%. At that point she  decided to have bariatric surgery. She had a Roux-en-Y gastric bypass on 12/20/2006. She subsequently lost weight down to 173.4 pounds on 05/23/08, but then subsequently regained weight to the 190s. Her  hemoglobin A1c values varied in parallel with her  weights. On 05/23/08, at the point of her lowest weight, her hemoglobin A1c dropped to a nadir of 6.3%.  Since then her hemoglobin A1c values have varied between 6.6 and 13.4%. Although we were initially able to stop all of her diabetes medicines after bariatric surgery, when she began to regain weight we restarted metformin, 500 mg twice daily. Although she took her metformin twice daily for a long time, she had discontinued it many months ago. She hated to take medicines and could not make herself be compliant in taking medications or exercising. When her HbA1c increased to 9.4% in September 2015, I stopped her Byetta injections and started her on liraglutide (Victoza) daily injections on 11/16/13.  B. Vitamin D deficiency, hypocalcemia, and secondary hyperparathyroidism: Just before her gastric bypass, I obtained baseline bone mineral metabolism studies. Her 25-hydroxy vitamin D was very low at 7 (normal greater than or equal to 30). Her calcium was 9.5 (normal 8.6-10.6). Her parathyroid hormone was 38.4 (normal 14-72). Subsequent to her surgery, the patient was supposed to be taking multivitamins with calcium and vitamin D, but did not always do so. On 03/29/2007 her 25-hydroxy vitamin D was 29, but her calcium decreased to 8.7. Her PTH was slightly elevated at 73.5. Her 1,25-hydroxy vitamin D was 46. Her iron was 56. I asked her to make sure she took her multivitamins and calcium daily. Unfortunately, she has often been non-compliant with her multivitamins and calcium. Her 25-hydroxy vitamin D values have varied between 15-35. Her calcium values have varied between 8.1-9.4. Her PTH values have remained elevated between 79-167. The patient's iron levels have also been low, between 32-34. She is supposed to be taking iron every day as well. Unfortunately, in August of 2014 she discontinued all vitamins and supplements.   C. The patient has had psychological issues for many years that have adversely affected her  eating, her unwillingness to exercise, her noncompliance with taking medications, her weight, and her T2DM care. Fortunately, her recent outpatient psychological therapy with Ms. Rea College, RN, MS has often resulted in [place] having a much more positive attitude about life in general and a generally more positive attitude about taking her medications. Unfortunately, Alexa [name]'s noncompliance problems often tend to recur.  3. The patient's last [facility] visit was on 06/04/16.   A. In the interim she resumed taking all of her medications consistently for several months, but then stopped taking them about 3 months ago. There are some in-law issues and problems with her mother-in-law's estate. Drishti remains in counseling with Ms. Showfety. Physically she is "a little tired" due to her heavy work schedule.                  B. She has not had much of a problem with asthma.    C. She has not been sleeping well. Night sweats are less frequent. She has been having more leg cramps during the night.   D. She is still drinking one small regular Coke per day. She has not been exercising.   E. She is not taking Lexapro, 20 mg/day; Synthroid, 25 mcg/day; Micardis, 40 mg/day; Victoza,1.8 mg/day; Protonix, 40 mg/day; metformin 500 mg, twice daily; iron, three times per week; calcium carbonate with vitamin D, one tablet at  lunch and two tablets in the evenings; MVI. She was also taking Contrave, which had caused both a decrease in her appetite and early satiety.  4. Review of Systems: There are no other significant issues  Constitutional: The patient feels "tired".   Eyes: Vision is fairly good. There are no significant eye complaints. The last eye exam that I am aware of occurred in the Spring of 2014. She has a follow up eye exam scheduled for December 2018.   Neck: She has had some intermittent trouble swallowing at the level of her mid-neck. The patient has no complaints of anterior neck swelling, soreness, tenderness,  pressure, or discomfort.  Heart: She has some chest pain when she is tired and anxious. Heart rate increases with exercise or other physical activity. The patient has no complaints of palpitations, irregular heat beats, chest pain, or chest pressure. Gastrointestinal: The patient still tends to be constipated at times. She has not had any post-prandial bloating recently. She does not have any excessive hunger, acid reflux, upset stomach, stomach aches or pains, or diarrhea. Legs: She still has occasional nocturnal calf cramps. Muscle mass and strength seem normal. There are no new complaints of numbness, tingling, or burning. No edema is noted. Feet: She has been having more nocturnal foot cramps. There are no new complaints of numbness, tingling, or burning. No edema is noted. Tinea improves when she uses her anti-fungal cream.  Neuro: No new sensory or muscular problems Psych: She feels that she is not doing as well.    GU: She has been urinating a lot. She occasionally has nocturia.  Hypoglycemia: She has not had any low BG symptoms.   5. BG printout: She did not bring in her BG meter today. She has not been checking her BGs. I discussed the use of the [facility] with her.   PAST MEDICAL, FAMILY, AND SOCIAL HISTORY:  Past Medical History:  Diagnosis Date  . Anemia, iron deficiency   . Asthma   . Asthma, chronic   . CAD (coronary artery disease)   . Chronic insomnia   . Combined hyperlipidemia   . Depression   . Diabetes mellitus   . Diabetes mellitus type II   . Diabetic autonomic neuropathy ([place])   . DM type 2 with diabetic peripheral neuropathy ([place])   . Dyspepsia   . Elevated homocysteine ([place])   . Essential tremor   . Fatigue   . GERD (gastroesophageal reflux disease)   . GERD (gastroesophageal reflux disease)   . Glossitis   . Goiter   . Hyperparathyroidism , secondary, non-renal ([place])   . Hypertension   . Nonproliferative diabetic retinopathy associated with type 2  diabetes mellitus ([place])    RESOLVED  . Obesity, Class III, BMI 40-49.9 (morbid obesity) ([place])   . Pallor   . Tachycardia   . Thyroiditis, autoimmune   . Vitamin D deficiency     Family History  Problem Relation Age of Onset  . Thyroid disease Mother        Hypothyroid  . Diabetes Father        T2 DM  . Obesity Brother        250 pounds at a height of 76 inches.  . Thyroid disease Sister        Hypothyroid  . Diabetes Maternal Grandmother   . Colon cancer Other        great grandfather  . Liver disease Maternal Aunt        NASH  .  Esophageal cancer Neg Hx   . Pancreatic cancer Neg Hx   . Kidney disease Neg Hx      Current Outpatient Prescriptions:  .  albuterol (PROVENTIL HFA;VENTOLIN HFA) 108 (90 Base) MCG/ACT inhaler, Inhale 2 puffs into the lungs every 6 (six) hours as needed for wheezing or shortness of breath. (Patient not taking: Reported on 11/24/2016), Disp: 1 Inhaler, Rfl: 11 .  albuterol (PROVENTIL) (2.5 MG/3ML) 0.083% nebulizer solution, Take 3 mLs (2.5 mg total) by nebulization every 6 (six) hours as needed for wheezing or shortness of breath. (Patient not taking: Reported on 11/24/2016), Disp: 150 mL, Rfl: 1 .  Calcium Carbonate-Vitamin D (CALCIUM-VITAMIN D) 500-200 MG-UNIT per tablet, Take 1 tablet by mouth 2 (two) times daily with a meal. Reported on 02/26/2015, Disp: , Rfl:  .  conjugated estrogens (PREMARIN) vaginal cream, Place 1 Applicatorful vaginally daily. (Patient not taking: Reported on 11/24/2016), Disp: 42.5 g, Rfl: 11 .  cyclobenzaprine (FLEXERIL) 10 MG tablet, Take 1 tablet (10 mg total) by mouth 3 (three) times daily as needed for muscle spasms. (Patient not taking: Reported on 11/24/2016), Disp: 90 tablet, Rfl: 0 .  diphenhydrAMINE (BENADRYL) 25 MG tablet, Take 25-50 mg by mouth 2 (two) times daily as needed for allergies or sleep., Disp: , Rfl:  .  escitalopram (LEXAPRO) 20 MG tablet, Take 1 tablet (20 mg total) by mouth daily. (Patient not taking:  Reported on 11/24/2016), Disp: 90 tablet, Rfl: 1 .  FERROUS SULFATE PO, Take 1 tablet by mouth daily., Disp: , Rfl:  .  levothyroxine (SYNTHROID, LEVOTHROID) 25 MCG tablet, Take one tablet daily. (Patient not taking: Reported on 11/24/2016), Disp: 90 tablet, Rfl: 3 .  meloxicam (MOBIC) 15 MG tablet, Take 1 tablet (15 mg total) by mouth daily. (Patient not taking: Reported on 11/24/2016), Disp: 90 tablet, Rfl: 0 .  metFORMIN (GLUCOPHAGE) 500 MG tablet, TAKE 1 TABLET BY MOUTH AT BREAKFAST AND ONE AT DINNER. (Patient not taking: Reported on 11/24/2016), Disp: 180 tablet, Rfl: 3 .  Multiple Vitamin (MULTIVITAMIN) tablet, Take 1 tablet by mouth daily. Reported on 02/26/2015, Disp: , Rfl:  .  Naltrexone-Bupropion HCl ER 8-90 MG TB12, Take by mouth daily., Disp: , Rfl:  .  pantoprazole (PROTONIX) 40 MG tablet, Take 1 tablet (40 mg total) by mouth daily. (Patient not taking: Reported on 11/24/2016), Disp: 90 tablet, Rfl: 4 .  telmisartan (MICARDIS) 40 MG tablet, TAKE 1 TABLET BY MOUTH EACH MORNING. (Patient not taking: Reported on 11/24/2016), Disp: 90 tablet, Rfl: 3 .  traMADol (ULTRAM) 50 MG tablet, Take 1 tablet (50 mg total) by mouth every 8 (eight) hours as needed. (Patient not taking: Reported on 11/24/2016), Disp: 90 tablet, Rfl: 1 .  VICTOZA 18 MG/3ML SOPN, INJECT 1.8 MG DAILY (Patient not taking: Reported on 11/24/2016), Disp: 27 mL, Rfl: PRN  Allergies as of 11/24/2016 - Review Complete 11/24/2016  Allergen Reaction Noted  . Theophyllines Palpitations 05/13/2010    1. 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 2. Activities: She has not done much physical activity. She and her husband often visit [place] the [facility] in [place], [place], which they enjoy.   3. Smoking, alcohol, or drugs: None 4. Primary Care Provider: Dr. Tommie Ard Baxley 5.  Therapist: Ms. Rea College, MS  REVIEW OF SYSTEM: There are no other significant problems involving  Srihitha's other body systems.   Objective:  Vital Signs:  BP 118/82   Pulse 78   Ht 5' 3.39" (1.61 m)   Wt 204 lb (92.5 kg)   LMP 05/11/2010   BMI 35.70 kg/m    Ht Readings from Last 3 Encounters:  11/24/16 5' 3.39" (1.61 m)  11/24/16 5' 2.25" (1.581 m)  03/17/16 5' 3.58" (1.615 m)   Wt Readings from Last 3 Encounters:  11/24/16 204 lb (92.5 kg)  11/24/16 204 lb (92.5 kg)  06/04/16 199 lb (90.3 kg)   PHYSICAL EXAM:  Constitutional:  The patient looks healthy and alert, and very talkative about her family issues. She is wearing a skirt and blouse, but is not wearing any make up or lipstick today. Her insight is normal. Her affect is normal and fairly upbeat. She has regained 5 pounds in 5 months, equivalent to a net caloric gain of 110 calories per day. .   Eyes: There is no arcus or proptosis.  Mouth: The oropharynx appears normal. The tongue appears normal. There is normal oral moisture. There is no obvious gingivitis. Neck: There are no bruits present. The thyroid gland appears normal in size. The thyroid gland is smaller today at 20 grams in size. The thyroid gland has shrunk back to normal size. The consistency of the thyroid gland is normal. There is no thyroid tenderness to palpation. Lungs: The lungs are clear. Air movement is good. Heart: The heart rhythm and rate appear normal. Heart sounds S1 and S2 are normal. I do not appreciate any pathologic heart murmurs. Abdomen: The abdomen is enlarged. Bowel sounds are normal. The abdomen is soft and non-tender. There is no obviously palpable hepatomegaly, splenomegaly, or other masses.  Arms: Muscle mass appears appropriate for age.  Hands: She has a trace tremor. Phalangeal and metacarpophalangeal joints appear normal. Palms are normal. Legs: Muscle mass appears appropriate for age. There is no edema.  Feet: There are no  significant deformities. Dorsalis pedis pulses are normal 2+ bilaterally. She has 1+ tinea in her great toenails.  Neurologic: Muscle strength is normal for age and gender  in both the upper and the lower extremities. Muscle tone appears normal. Sensation to touch is normal in the legs and feet.     LAB DATA:  Labs 11/24/16: CBG 468; urine glucose 2000+, negative ketones  Labs 11/17/16: HbA1c 13.4%; TSH 1.54; cholesterol 173, triglycerides 91, HDL 71, LDL 83; 25-OH vitamin D 21; CMP normal except for glucose 314; CBC normal except MCHC 26.5 (ref 27-33)   06/04/16: HBA1c 8.1%, CBG 115  05/27/16: TSH 2.62, free T4 1.1, free T3 2.3; CMP normal except for glucose 161, and creatinine 1.09; cholesterol 187, triglycerides 77, HDL 74, LDL 98; urinary microalbumin/creatinine ratio 6; PTH 112, calcium 9.2, 25-OH vitamin D 33  03/17/16: CBG 145  Labs 12/31/15: HbA1c 13.4%, CBG 390; CMP normal except for glucose 344 and creatinine 1.14  Labs 10/08/15: HbA1c 9.6%  Labs 07/02/15: HbA1c 8.9%; PTH 107, calcium 9.2, 25-OH vitamin D 33; CBC normal; CMP normal except for creatinine 1.07; cholesterol 174, triglycerides 91, HDL 74, LDL 82; TSH 2.43, free T4 1.2, free T3 2.3  Labs 02/19/15: PTH 99, calcium 8.7, 25-OH vitamin D 20  Labs 10/08/14: HbA1c 8.0%   Labs 08/06/14: HbA1c 7.4%  Labs 08/01/14: TSH 2.014, free T4 0.87, free T3 2.4; PTH 79 (normal 14-64), calcium 9.1; 25-OH vitamin D 35 CMP normal  Labs 05/11/14: HbA1c was 7.7%, without any low BGs; TSH 2.416, free  T4 1.13, free T3 2.3  Labs 02/22/14: HbA1c 11.6%  Labs 02/13/14: Calcium 9.0, PTH 131, 25-OH vitamin D 15; CMP normal except for glucose of 165; cholesterol 177, triglycerides 108, HDL 90, LDL 65; urinary microalbumin/creatinine ratio 15.6; TSH 3.639, free T4 1.30, free T3 2.5  Labs 10/10/13: HbA1c is 9.4%, compared with 8.1% at last visit and with 9.3% in December;   Labs 05/08/13: HbA1c 8.1%; PTH 90.2, calcium 9.5, 25-hydroxy vitamin D 46; CMP  normal except for glucose of 215; WBC 8.0, RBC 5.20, Hgb 12.5, Hct 37.8%, MCV 72.7 (normal 78-100), iron 55  Labs 02/23/13: CMP normal, except glucose 194 and alkaline phosphatase 126; microalbumin/creatinine ratio was 11.5; TSH 2.086, free T4 1.29, free T3 3.2, TPO antibody < 10; 25-hydroxy vitamin D 18; C-peptide 1.79; cholesterol 156, triglycerides 64, HDL 61, LDL 82  Labs 01/05/13: Hemoglobin A1c was 9.3%, compared with 9.3 at last visit and with 9.1% at the visit prior. All of these values were major increases from 6.6% in May 2012.    Labs 04/14/12: 25-hydroxy vitamin D 21, iron 203,  Vitamin B12 379,   Labs 11/16/11: Hgb 10.3, Hct 32.4%; CMP normal except glucose 146' cholesterol 201, triglycerides 70, HDL 66, LDL 121; vitamin D 22           Labs 06/09/10: Cholesterol was 175, triglycerides 76, HDL 62, LDL 98. TSH was 1.217. Free T4 was 1.10. Free T3 was 2.7. 25-hydroxy vitamin D was 19. PTH was 167.   Assessment and Plan:   ASSESSMENT:  1. Type 2 diabetes mellitus:   A. Her T2DM was out of control at her December 2017 visit due to her not taking medications reliably and to not exercising. Her HbA1c of 13.4% was probably the highest that it had ever been.    B. At her last visit in May her HbA1c has decreased to 8.0%. Her BG control improved dramatically when she took her medications and watched her carb intake. Contrave was helping.   C. Unfortunately, she has since relapsed in terms of not taking her medications and other maladaptive health behaviors. Her HbA1c has increased back to 13.4%.  2. Hypoglycemia: She has not had any hypoglycemia symptoms..   3. Obesity: Her weight has increased 5 pounds since May 2018. The Contrave, Victoza, and her other medications were helping her. She needs to resume her medications, continue to Eat Right and exercise daily.    4-5. Autonomic neuropathy and tachycardia: The patient's autonomic neuropathy and  heart rate had improved as a result of her lower  HbA1c.   6. Hypertension: Her BP is worse since discontinuing her medications. She needs to resume the medications and try to fit in exercise.  7-8. Anhedonia and anxiety: She is doing better in some ways, but is angry about the in-law problems. She needs  to take her medications. She also needs to call for help when she begins to do poorly. She also needs to continue to find ways to have more joy in her life. 9-12. GERD/esophagitis/difficulty swallowing/gastroparesis: Her reflux and other GI symptoms have improved. She continues to have problems swallowing. I recommended a referral to ENT.  13-15: Hypocalcemia, vitamin D deficiency, and secondary hyperparathyroidism:   A. Due to her bariatric surgery she has more difficulty absorbing vitamin D and calcium. When she develops hypocalcemia and vitamin D deficiency, her parathyroid glands appropriated produce more PTH, resulting in secondary hyperparathyroidism. However, if she takes her calcium and vitamin D all three parameters will normalize.  B. Her calcium was normal in October. Her vitamin D level was normal in May 2018, but low in October 2018. She needs to take her vitamin D and calcium. Unfortunately, she missed too many doses of calcium, resulting in higher PTH levels. Adding two Tums at bedtime would help.  55. Noncompliance: Since last visit her compliance with eating right and taking her medications has worsened.   17: Muscle cramps: Still active.  Her potassium and calcium were normal in May and October 2018. She may be more dehydrated at times.    18. Hypothyroidism: her TSH in October was normal.   PLAN:  1. Diagnostic: None at this time. Consider ordering a Freestyle Libre for her. 2. Therapeutic: Resume medications as currently prescribed, except increase metformin to 1000 mg, twice daily. Consider ordering [employer], 50,000 IU per week to improve her vitamin D level. Try to fit in exercise daily.  See Ms. Showfety in follow up.   3.  Patient education: We discussed all of the above at great length. She must continue to take her medications and supplements. She also needs to see me every two months.   4. Follow-up: 2 months   Level of Service: This visit lasted in excess of 55 minutes. More than 50% of the visit was devoted to counseling.   Tillman Sers, MD, CDE Adult and Pediatric Endocrinology 11/24/2016 1:37 PM

## 2016-11-24 NOTE — Telephone Encounter (Signed)
OK x 6 months

## 2016-11-24 NOTE — Telephone Encounter (Signed)
Pt would like to know if you would refill her Tramadol and Lexapro her psychiatrist is no longer with her NP anymore so she is not able to fill her medications anymore please advise?

## 2016-11-24 NOTE — Telephone Encounter (Signed)
Done

## 2016-11-24 NOTE — Patient Instructions (Addendum)
Follow up visit in 6 weeks, before Christmas, ideally at 3;45 PM on 01/11/17.

## 2016-11-25 NOTE — Patient Instructions (Signed)
It was a pleasure to see you today.  Please continue counseling and try to take better care of yourself particularly with diabetic control.  Return in 6 months.
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
## 2016-12-01 MED FILL — VICTOZA 18 MG/3 ML INJECT P: 18 | 90 days supply | Qty: 27 | Fill #2

## 2017-01-11 ENCOUNTER — Encounter (INDEPENDENT_AMBULATORY_CARE_PROVIDER_SITE_OTHER): Payer: Self-pay | Admitting: "Endocrinology

## 2017-01-11 ENCOUNTER — Ambulatory Visit (INDEPENDENT_AMBULATORY_CARE_PROVIDER_SITE_OTHER): Payer: 59 | Admitting: "Endocrinology

## 2017-01-11 VITALS — BP 128/78 | Ht 63.0 in | Wt 201.0 lb

## 2017-01-11 DIAGNOSIS — E1143 Type 2 diabetes mellitus with diabetic autonomic (poly)neuropathy: Secondary | ICD-10-CM

## 2017-01-11 DIAGNOSIS — E1142 Type 2 diabetes mellitus with diabetic polyneuropathy: Secondary | ICD-10-CM

## 2017-01-11 DIAGNOSIS — E049 Nontoxic goiter, unspecified: Secondary | ICD-10-CM | POA: Diagnosis not present

## 2017-01-11 DIAGNOSIS — E063 Autoimmune thyroiditis: Secondary | ICD-10-CM | POA: Diagnosis not present

## 2017-01-11 DIAGNOSIS — E11649 Type 2 diabetes mellitus with hypoglycemia without coma: Secondary | ICD-10-CM | POA: Diagnosis not present

## 2017-01-11 DIAGNOSIS — R000 Tachycardia, unspecified: Secondary | ICD-10-CM

## 2017-01-11 DIAGNOSIS — R4584 Anhedonia: Secondary | ICD-10-CM | POA: Diagnosis not present

## 2017-01-11 DIAGNOSIS — E559 Vitamin D deficiency, unspecified: Secondary | ICD-10-CM | POA: Diagnosis not present

## 2017-01-11 LAB — POCT GLUCOSE (DEVICE FOR HOME USE): POC Glucose: 198 mg/dl — AB (ref 70–99)

## 2017-01-11 MED FILL — metFORMIN HCL 500 MG TABS: 500 | 90 days supply | Qty: 180 | Fill #1

## 2017-01-11 MED FILL — traMADol HCL 50 MG TABS: 50 | 30 days supply | Qty: 90 | Fill #1

## 2017-01-11 NOTE — Progress Notes (Signed)
Subjective:  Patient Name: Torre Pikus Date of Birth: 06-01-1960  MRN: [id number]  Dava Rensch  presents to the office today for follow-up of her type 2 diabetes mellitus, obesity, combined hyperlipidemia, GERD, hypertension, ASHD, dyspepsia, pedal edema, non-proliferative diabetic retinopathy, goiter, depression, autonomic neuropathy, tachycardia, peripheral neuropathy, acquired hypothyroidism, vitamin D deficiency, hypocalcemia, secondary hyperparathyroidism, glossitis, pallor, fatigue, iron deficiency anemia, anhedonia, disinclination to take medicines, and status post Roux-en-Y gastric bypass.   HISTORY OF PRESENT ILLNESS:   Channing is a 56 y.o. Caucasian woman. [name] was unaccompanied.  1. The patient first presented to me on 08/20/04 in referral from her primary care internist, Dr. Emeline General, for evaluation and management of type 2 diabetes, obesity, and multiple medical issues. She was 56 years old.   [name] had a long history of obesity. She was heavy as a child. She underwent menarche at age 32. At age 66 she was 180 pounds. In 1996 she weighed 218 pounds. In 1999 she was diagnosed with type 2 diabetes mellitus. Her weight at that point was 250 pounds. She was treated with Glucophage, and Actos. Actos made her gain even more weight. She had also been treated with glipizide in the past. More recently she had been treated with Lantus and Glucophage plus regular insulin as needed, especially for when she took steroids for asthma attacks. Maximum weight had been 296 pounds one month prior to that first visit with me. Her tendency to gain weight and her difficulty in losing weight were aggravated by long-standing, intermittent depression and by severe, recurrent asthma requiring the use of steroid medications.  B. Past medical history was also positive for a 60% blockage in one of her coronary arteries. She had significant issues with GERD and dyspepsia. She also had combined  hyperlipidemia. She had a previous cardiac catheterization and previous tonsillectomy. She was allergic to theophylline. Her pertinent review of systems was positive for some numbness and tingling in her feet. Family history was positive for type 2 diabetes in her father and her maternal grandmother. Her brother weighed 250 pounds at a height of 76 inches. Both her mother and her sister were hypothyroid.  C. On physical examination, her weight was 279.9 pounds. Her BMI was 49.6. Her blood pressure was 142/86. Her heart rate was 96. Her hemoglobin A1c was 9.8%. She was alert and oriented x3. Her affect was normal. Her insight was fair. She was obviously quite obese. She had a 25 gm thyroid gland. She had 1+ tremor of her hands. She had 1+ DP pulses and 2+ tinea pedis. Sensation to touch was intact in her feet. Laboratory data included a normal CMP. Her cholesterol was 175, triglycerides 76, HDL 53, and LDL 107. Her TSH was 2.98. Since she obviously did have type 2 diabetes mellitus associated with morbid obesity, and since weight loss was a major factor for her, I asked her to resume her metformin twice daily. I also started her on Byetta, initially 5 mcg twice daily and later 10 mcg twice daily. I discontinued her Lantus insulin.  2. During the last 12 years,we have had some successes, some failures, and some new problem areas.  A. T2DM: In October 2007 the combination of Byetta and metformin was causing more gastrointestinal problems. Patient opted to stop the metformin and continue the Byetta because it was helping her with weight control and blood sugar control. By 08/11/06 her weight had decreased to 267.6 pounds. Hemoglobin A1c was 8.6%. At that point she  decided to have bariatric surgery. She had a Roux-en-Y gastric bypass on 12/20/2006. She subsequently lost weight down to 173.4 pounds on 05/23/08, but then subsequently regained weight to the 190s. Her  hemoglobin A1c values varied in parallel with her  weights. On 05/23/08, at the point of her lowest weight, her hemoglobin A1c dropped to a nadir of 6.3%.  Since then her hemoglobin A1c values have varied between 6.6 and 13.4%. Although we were initially able to stop all of her diabetes medicines after bariatric surgery, when she began to regain weight we restarted metformin, 500 mg twice daily. Although she took her metformin twice daily for a long time, she had discontinued it many months ago. She hated to take medicines and could not make herself be compliant in taking medications or exercising. When her HbA1c increased to 9.4% in September 2015, I stopped her Byetta injections and started her on liraglutide (Victoza) daily injections on 11/16/13.  B. Vitamin D deficiency, hypocalcemia, and secondary hyperparathyroidism: Just before her gastric bypass, I obtained baseline bone mineral metabolism studies. Her 25-hydroxy vitamin D was very low at 7 (normal greater than or equal to 30). Her calcium was 9.5 (normal 8.6-10.6). Her parathyroid hormone was 38.4 (normal 14-72). Subsequent to her surgery, the patient was supposed to be taking multivitamins with calcium and vitamin D, but did not always do so. On 03/29/2007 her 25-hydroxy vitamin D was 29, but her calcium decreased to 8.7. Her PTH was slightly elevated at 73.5. Her 1,25-hydroxy vitamin D was 46. Her iron was 56. I asked her to make sure she took her multivitamins and calcium daily. Unfortunately, she has often been non-compliant with her multivitamins and calcium. Her 25-hydroxy vitamin D values have varied between 15-35. Her calcium values have varied between 8.1-9.4. Her PTH values have remained elevated between 79-167. The patient's iron levels have also been low, between 32-34. She is supposed to be taking iron every day as well. Unfortunately, in August of 2014 she discontinued all vitamins and supplements.   C. The patient has had psychological issues for many years that have adversely affected her  eating, her unwillingness to exercise, her noncompliance with taking medications, her weight, and her T2DM care. Fortunately, her recent outpatient psychological therapy with Ms. Rea College, RN, MS has often resulted in Crowell having a much more positive attitude about life in general and a generally more positive attitude about taking her medications. Unfortunately, Selena's noncompliance problems often tend to recur.  3. The patient's last [facility] visit was on 11/24/16.   A. In the interim she has been pretty consistent with taking her Victoza, metformin, Protonix, Synthroid, Micardis, and Lexapro. She has not been as compliant with calcium, but does take it at times.   B. She has been healthy, but her mouth is drier now.   C. There are still some in-law issues and problems with her mother-in-law's estate that she and her husband re dealing with.   Leone Payor remains in counseling with Ms. Showfety.   E. She has not had much of a problem with asthma.    F. She has been sleeping better, except for her husband's snoring. She still has some hot flashes, but the night sweats are less frequent. She has been having fewer leg cramps during the night.   G. She is still drinking one small regular Coke per day. She has not been exercising.   H. She is taking Lexapro, 20 mg/day; Synthroid, 25 mcg/day; Micardis, 40 mg/day; Victoza,1.8 mg/day;  Protonix, 40 mg/day; metformin 500 mg, twice daily. She has not been taking much iron or calcium. She resumed talking Contrave, one pill in the morning and two pills at night.   I. She is trying very hard not to allow herself to become over-committed.  4. Review of Systems: There are no other significant issues  Constitutional: The patient feels "pretty good".   Eyes: Vision is fairly good. There are no significant eye complaints. The last eye exam that I am aware of occurred in the Spring of 2014. She has a follow up eye exam scheduled for January 2019.   Neck: She has  not had any recent trouble swallowing at the level of her mid-neck. The patient has no complaints of anterior neck swelling, soreness, tenderness, pressure, or discomfort.  Heart: She has occasionally had some chest pain when she is tired and anxious, but much less frequently. Heart rate increases with exercise or other physical activity.  The patient has no complaints of palpitations, irregular heat beats, chest pain, or chest pressure. Gastrointestinal: The patient's BMs have been pretty regular. She has not had any post-prandial bloating recently. She does not have any excessive head hunger or belly hunger, acid reflux, upset stomach, stomach aches or pains, swallowing difficulties, or diarrhea. Legs: She still has occasional nocturnal calf cramps. Muscle mass and strength seem normal. There are no new complaints of numbness, tingling, or burning. No edema is noted. Feet: She has been having more nocturnal foot cramps. There are no new complaints of numbness, tingling, or burning. No edema is noted. Tinea improves when she uses her anti-fungal cream.  Neuro: No new sensory or muscular problems Psych: She is doing much better.    GU: She has been urinating a lot. She occasionally has nocturia.  Hypoglycemia: She has occasionally had low BG symptoms.   5. BG printout: She did not bring in her BG meter today. She has not been checking her BGs. I discussed the use of the [facility] with her.   PAST MEDICAL, FAMILY, AND SOCIAL HISTORY:  Past Medical History:  Diagnosis Date  . Anemia, iron deficiency   . Asthma   . Asthma, chronic   . CAD (coronary artery disease)   . Chronic insomnia   . Combined hyperlipidemia   . Depression   . Diabetes mellitus   . Diabetes mellitus type II   . Diabetic autonomic neuropathy ([place])   . DM type 2 with diabetic peripheral neuropathy ([place])   . Dyspepsia   . Elevated homocysteine ([place])   . Essential tremor   . Fatigue   . GERD (gastroesophageal reflux  disease)   . GERD (gastroesophageal reflux disease)   . Glossitis   . Goiter   . Hyperparathyroidism , secondary, non-renal ([place])   . Hypertension   . Nonproliferative diabetic retinopathy associated with type 2 diabetes mellitus ([place])    RESOLVED  . Obesity, Class III, BMI 40-49.9 (morbid obesity) ([place])   . Pallor   . Tachycardia   . Thyroiditis, autoimmune   . Vitamin D deficiency     Family History  Problem Relation Age of Onset  . Thyroid disease Mother        Hypothyroid  . Diabetes Father        T2 DM  . Obesity Brother        250 pounds at a height of 76 inches.  . Thyroid disease Sister        Hypothyroid  . Diabetes Maternal Grandmother   .  Colon cancer Other        great grandfather  . Liver disease Maternal Aunt        NASH  . Esophageal cancer Neg Hx   . Pancreatic cancer Neg Hx   . Kidney disease Neg Hx      Current Outpatient Medications:  .  albuterol (PROVENTIL HFA;VENTOLIN HFA) 108 (90 Base) MCG/ACT inhaler, Inhale 2 puffs into the lungs every 6 (six) hours as needed for wheezing or shortness of breath., Disp: 1 Inhaler, Rfl: 11 .  albuterol (PROVENTIL) (2.5 MG/3ML) 0.083% nebulizer solution, Take 3 mLs (2.5 mg total) by nebulization every 6 (six) hours as needed for wheezing or shortness of breath., Disp: 150 mL, Rfl: 1 .  Calcium Carbonate-Vitamin D (CALCIUM-VITAMIN D) 500-200 MG-UNIT per tablet, Take 1 tablet by mouth 2 (two) times daily with a meal. Reported on 02/26/2015, Disp: , Rfl:  .  conjugated estrogens (PREMARIN) vaginal cream, Place 1 Applicatorful vaginally daily., Disp: 42.5 g, Rfl: 11 .  cyclobenzaprine (FLEXERIL) 10 MG tablet, Take 1 tablet (10 mg total) by mouth 3 (three) times daily as needed for muscle spasms., Disp: 90 tablet, Rfl: 0 .  diphenhydrAMINE (BENADRYL) 25 MG tablet, Take 25-50 mg by mouth 2 (two) times daily as needed for allergies or sleep., Disp: , Rfl:  .  escitalopram (LEXAPRO) 20 MG tablet, Take 1 tablet (20 mg total) by  mouth daily., Disp: 90 tablet, Rfl: 1 .  FERROUS SULFATE PO, Take 1 tablet by mouth daily., Disp: , Rfl:  .  levothyroxine (SYNTHROID, LEVOTHROID) 25 MCG tablet, Take one tablet daily., Disp: 90 tablet, Rfl: 3 .  metFORMIN (GLUCOPHAGE) 500 MG tablet, TAKE 12 TABLETS BY MOUTH AT BREAKFAST AND TWO AT DINNER., Disp: 360 tablet, Rfl: 3 .  Multiple Vitamin (MULTIVITAMIN) tablet, Take 1 tablet by mouth daily. Reported on 02/26/2015, Disp: , Rfl:  .  Naltrexone-Bupropion HCl ER 8-90 MG TB12, Take by mouth daily., Disp: , Rfl:  .  pantoprazole (PROTONIX) 40 MG tablet, Take 1 tablet (40 mg total) by mouth daily., Disp: 90 tablet, Rfl: 4 .  telmisartan (MICARDIS) 40 MG tablet, TAKE 1 TABLET BY MOUTH EACH MORNING., Disp: 90 tablet, Rfl: 3 .  traMADol (ULTRAM) 50 MG tablet, Take 1 tablet (50 mg total) by mouth every 8 (eight) hours as needed., Disp: 90 tablet, Rfl: 1 .  VICTOZA 18 MG/3ML SOPN, INJECT 1.8 MG DAILY, Disp: 27 mL, Rfl: PRN .  meloxicam (MOBIC) 15 MG tablet, Take 1 tablet (15 mg total) by mouth daily., Disp: 90 tablet, Rfl: 0 .  TRULICITY 2.99 BZ/1.6RC SOPN, Take one subcutaneous injection of 7.89 mg of Trulicity every week. (Patient not taking: Reported on 01/11/2017), Disp: 4 pen, Rfl: 6  Allergies as of 01/11/2017 - Review Complete 11/24/2016  Allergen Reaction Noted  . Theophyllines Palpitations 05/13/2010    1. 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 2. Activities: She has not done much physical activity. She and her husband will go to [place] over [place].  3. Smoking, alcohol, or drugs: None 4. Primary Care Provider: Dr. Tommie Ard Baxley 5. Therapist: Ms. Rea College, MS  REVIEW OF SYSTEM: There are no other significant problems involving Ellionna's other body systems.   Objective:  Vital Signs:  BP 128/78   Ht 5\' 3"  (1.6 m)   Wt 201 lb (91.2 kg)  LMP  05/11/2010   BMI 35.61 kg/m    Ht Readings from Last 3 Encounters:  01/11/17 5\' 3"  (1.6 m)  11/24/16 5' 3.39" (1.61 m)  11/24/16 5' 2.25" (1.581 m)   Wt Readings from Last 3 Encounters:  01/11/17 201 lb (91.2 kg)  11/24/16 204 lb (92.5 kg)  11/24/16 204 lb (92.5 kg)   PHYSICAL EXAM:  Constitutional:  The patient looks wonderful today. She is alert and bright. Her affect and insight are very good. She is wearing a blouse and slacks today, but is not wearing any make up or lipstick today. She has lost 3 pounds in 6 weeks, equivalent to a net caloric loss of 165 calories per day. .   Eyes: There is no arcus or proptosis.  Mouth: The oropharynx appears normal. The tongue appears normal. There is normal oral moisture. There is no obvious gingivitis. Neck: There are no bruits present. The thyroid gland appears normal in size. The thyroid gland is again within normal limits at 20 grams in size. The consistency of the thyroid gland is normal. There is no thyroid tenderness to palpation. Lungs: The lungs are clear. Air movement is good. Heart: The heart rhythm and rate appear normal. Heart sounds S1 and S2 are normal. I do not appreciate any pathologic heart murmurs. Abdomen: The abdomen is enlarged. Bowel sounds are normal. The abdomen is soft and non-tender. There is no obviously palpable hepatomegaly, splenomegaly, or other masses.  Arms: Muscle mass appears appropriate for age.  Hands: She has a trace tremor. Phalangeal and metacarpophalangeal joints appear normal. Palms are normal. Legs: Muscle mass appears appropriate for age. There is no edema.  Feet: There are no significant deformities. Dorsalis pedis pulses are normal 2+ bilaterally. She has 1+ tinea in her great toenails.  Neurologic: Muscle strength is normal for age and gender  in both the upper and the lower extremities. Muscle tone appears normal. Sensation to touch is normal in the legs and feet.     LAB DATA:  Labs 01/11/17:  CBG 198  Labs 11/24/16: CBG 468; urine glucose 2000+, negative ketones  Labs 11/17/16: HbA1c 13.4%; TSH 1.54; cholesterol 173, triglycerides 91, HDL 71, LDL 83; 25-OH vitamin D 21; CMP normal except for glucose 314; CBC normal except MCHC 26.5 (ref 27-33)   06/04/16: HBA1c 8.1%, CBG 115  05/27/16: TSH 2.62, free T4 1.1, free T3 2.3; CMP normal except for glucose 161, and creatinine 1.09; cholesterol 187, triglycerides 77, HDL 74, LDL 98; urinary microalbumin/creatinine ratio 6; PTH 112, calcium 9.2, 25-OH vitamin D 33  03/17/16: CBG 145  Labs 12/31/15: HbA1c 13.4%, CBG 390; CMP normal except for glucose 344 and creatinine 1.14  Labs 10/08/15: HbA1c 9.6%  Labs 07/02/15: HbA1c 8.9%; PTH 107, calcium 9.2, 25-OH vitamin D 33; CBC normal; CMP normal except for creatinine 1.07; cholesterol 174, triglycerides 91, HDL 74, LDL 82; TSH 2.43, free T4 1.2, free T3 2.3  Labs 02/19/15: PTH 99, calcium 8.7, 25-OH vitamin D 20  Labs 10/08/14: HbA1c 8.0%   Labs 08/06/14: HbA1c 7.4%  Labs 08/01/14: TSH 2.014, free T4 0.87, free T3 2.4; PTH 79 (normal 14-64), calcium 9.1; 25-OH vitamin D 35 CMP normal  Labs 05/11/14: HbA1c was 7.7%, without any low BGs; TSH 2.416, free T4 1.13, free T3 2.3  Labs 02/22/14: HbA1c 11.6%  Labs 02/13/14: Calcium 9.0, PTH 131, 25-OH vitamin D 15; CMP normal except for glucose of 165; cholesterol 177, triglycerides 108, HDL 90, LDL 65; urinary microalbumin/creatinine ratio 15.6;  TSH 3.639, free T4 1.30, free T3 2.5  Labs 10/10/13: HbA1c is 9.4%, compared with 8.1% at last visit and with 9.3% in December;   Labs 05/08/13: HbA1c 8.1%; PTH 90.2, calcium 9.5, 25-hydroxy vitamin D 46; CMP normal except for glucose of 215; WBC 8.0, RBC 5.20, Hgb 12.5, Hct 37.8%, MCV 72.7 (normal 78-100), iron 55  Labs 02/23/13: CMP normal, except glucose 194 and alkaline phosphatase 126; microalbumin/creatinine ratio was 11.5; TSH 2.086, free T4 1.29, free T3 3.2, TPO antibody < 10; 25-hydroxy vitamin D 18;  C-peptide 1.79; cholesterol 156, triglycerides 64, HDL 61, LDL 82  Labs 01/05/13: Hemoglobin A1c was 9.3%, compared with 9.3 at last visit and with 9.1% at the visit prior. All of these values were major increases from 6.6% in May 2012.    Labs 04/14/12: 25-hydroxy vitamin D 21, iron 203,  Vitamin B12 379,   Labs 11/16/11: Hgb 10.3, Hct 32.4%; CMP normal except glucose 146' cholesterol 201, triglycerides 70, HDL 66, LDL 121; vitamin D 22           Labs 06/09/10: Cholesterol was 175, triglycerides 76, HDL 62, LDL 98. TSH was 1.217. Free T4 was 1.10. Free T3 was 2.7. 25-hydroxy vitamin D was 19. PTH was 167.   Assessment and Plan:   ASSESSMENT:  1. Type 2 diabetes mellitus:   A. Her T2DM was out of control at her December 2017 visit due to her not taking medications reliably and to not exercising. Her HbA1c of 13.4% was probably the highest that it had ever been.    B. At her visit in May 2018 her HbA1c has decreased to 8.0%. Her BG control improved dramatically when she took her medications and watched her carb intake. Contrave was helping.   C. Unfortunately, she later relapsed in terms of not taking her medications and other maladaptive health behaviors. Her HbA1c increased back to 13.4% as of 11/17/16.   D. Since resuming her DM and psych medications her BGs and her mood have improved.  2. Hypoglycemia: She has had some hypoglycemia symptoms recently. I recommended that she carry at least 40 grams of glucose with her at all times.  3. Obesity: Her weight has decreased 3 pounds since her last visit. The Contrave, Victoza, and her other medications are helping her. She needs to continue to take her medications, continue to Eat Right and exercise daily.    4-5. Autonomic neuropathy and tachycardia: The patient's autonomic neuropathy and  heart rate will improve as her  BGs decrease.    6. Hypertension: Her BP is better since resuming her medications. She needs to try to fit in exercise.  7-8.  Anhedonia and anxiety: She is doing much better now, partly due to taking her meds and partly due to working out some of the family problems. She needs to call for help when she begins to do poorly. She also needs to continue to find ways to have more joy in her life. 9-12. GERD/esophagitis/difficulty swallowing/gastroparesis: Her reflux, other GI symptoms, and difficulty swallowing have all improved.   13-15: Hypocalcemia, vitamin D deficiency, and secondary hyperparathyroidism:   A. Due to her bariatric surgery she has more difficulty absorbing vitamin D and calcium. When she develops hypocalcemia and vitamin D deficiency, her parathyroid glands appropriated produce more PTH, resulting in secondary hyperparathyroidism. However, if she takes her calcium and vitamin D all three parameters will normalize.   B. Her calcium was normal in October. Her vitamin D level was normal in May  2018, but low in October 2018. She needs to take her vitamin D and calcium. Unfortunately, she missed too many doses of calcium, resulting in higher PTH levels. Adding two Tums at bedtime would help.  22. Noncompliance: Since last visit her compliance with eating right and taking her medications has greatly improved.    17: Muscle cramps: Her cramps occur much less frequently. Her potassium and calcium were normal in May and October 2018. She may still be more dehydrated at times.    18. Hypothyroidism: Her TSH in October was normal.   PLAN:  1. Diagnostic: None at this time. Consider ordering a Freestyle Libre for her. 2. Therapeutic: Resume medications as currently prescribed, except increase metformin to 1000 mg, twice daily. Purchase [employer]otech, 50,000 IU per week to improve her vitamin D level. Try to fit in exercise daily.  See Ms. Showfety in follow up.   3. Patient education: We discussed all of the above at great length. She must continue to take her medications and supplements. She also needs to see me every two months.  When she knows that she is coming to see  "The Marveen Reeks", her compliance improves.  4. Follow-up: 2 months   Level of Service: This visit lasted in excess of 55 minutes. More than 50% of the visit was devoted to counseling.   Tillman Sers, MD, CDE Adult and Pediatric Endocrinology 01/11/2017 4:32 PM

## 2017-01-11 NOTE — Patient Instructions (Signed)
Follow up visit in 2 months.  

## 2017-02-15 DIAGNOSIS — F39 Unspecified mood [affective] disorder: Secondary | ICD-10-CM | POA: Diagnosis not present

## 2017-03-04 ENCOUNTER — Emergency Department (HOSPITAL_COMMUNITY): Payer: 59

## 2017-03-04 ENCOUNTER — Observation Stay (HOSPITAL_COMMUNITY)
Admission: EM | Admit: 2017-03-04 | Discharge: 2017-03-06 | Disposition: A | Discharge status: Home / Self Care | Payer: 59 | Attending: Internal Medicine | Admitting: Internal Medicine

## 2017-03-04 ENCOUNTER — Encounter (HOSPITAL_COMMUNITY): Payer: Self-pay | Admitting: Emergency Medicine

## 2017-03-04 DIAGNOSIS — J45909 Unspecified asthma, uncomplicated: Secondary | ICD-10-CM | POA: Diagnosis present

## 2017-03-04 DIAGNOSIS — Z7984 Long term (current) use of oral hypoglycemic drugs: Secondary | ICD-10-CM | POA: Insufficient documentation

## 2017-03-04 DIAGNOSIS — E559 Vitamin D deficiency, unspecified: Secondary | ICD-10-CM | POA: Insufficient documentation

## 2017-03-04 DIAGNOSIS — I2510 Atherosclerotic heart disease of native coronary artery without angina pectoris: Secondary | ICD-10-CM | POA: Diagnosis not present

## 2017-03-04 DIAGNOSIS — E1165 Type 2 diabetes mellitus with hyperglycemia: Secondary | ICD-10-CM | POA: Diagnosis not present

## 2017-03-04 DIAGNOSIS — Z6834 Body mass index (BMI) 34.0-34.9, adult: Secondary | ICD-10-CM | POA: Diagnosis not present

## 2017-03-04 DIAGNOSIS — Z79890 Hormone replacement therapy (postmenopausal): Secondary | ICD-10-CM | POA: Insufficient documentation

## 2017-03-04 DIAGNOSIS — E1142 Type 2 diabetes mellitus with diabetic polyneuropathy: Secondary | ICD-10-CM | POA: Diagnosis not present

## 2017-03-04 DIAGNOSIS — F329 Major depressive disorder, single episode, unspecified: Secondary | ICD-10-CM | POA: Insufficient documentation

## 2017-03-04 DIAGNOSIS — Z9114 Patient's other noncompliance with medication regimen: Secondary | ICD-10-CM | POA: Diagnosis not present

## 2017-03-04 DIAGNOSIS — R739 Hyperglycemia, unspecified: Secondary | ICD-10-CM

## 2017-03-04 DIAGNOSIS — E669 Obesity, unspecified: Secondary | ICD-10-CM | POA: Insufficient documentation

## 2017-03-04 DIAGNOSIS — E113299 Type 2 diabetes mellitus with mild nonproliferative diabetic retinopathy without macular edema, unspecified eye: Secondary | ICD-10-CM | POA: Diagnosis not present

## 2017-03-04 DIAGNOSIS — Z79899 Other long term (current) drug therapy: Secondary | ICD-10-CM | POA: Insufficient documentation

## 2017-03-04 DIAGNOSIS — F419 Anxiety disorder, unspecified: Secondary | ICD-10-CM | POA: Insufficient documentation

## 2017-03-04 DIAGNOSIS — I10 Essential (primary) hypertension: Secondary | ICD-10-CM | POA: Diagnosis not present

## 2017-03-04 DIAGNOSIS — I160 Hypertensive urgency: Secondary | ICD-10-CM | POA: Diagnosis not present

## 2017-03-04 DIAGNOSIS — R0789 Other chest pain: Secondary | ICD-10-CM | POA: Diagnosis not present

## 2017-03-04 DIAGNOSIS — E039 Hypothyroidism, unspecified: Secondary | ICD-10-CM | POA: Diagnosis not present

## 2017-03-04 DIAGNOSIS — E876 Hypokalemia: Secondary | ICD-10-CM | POA: Diagnosis not present

## 2017-03-04 DIAGNOSIS — R51 Headache: Secondary | ICD-10-CM | POA: Diagnosis not present

## 2017-03-04 DIAGNOSIS — E1143 Type 2 diabetes mellitus with diabetic autonomic (poly)neuropathy: Secondary | ICD-10-CM | POA: Diagnosis not present

## 2017-03-04 DIAGNOSIS — R079 Chest pain, unspecified: Secondary | ICD-10-CM | POA: Diagnosis present

## 2017-03-04 DIAGNOSIS — Z833 Family history of diabetes mellitus: Secondary | ICD-10-CM | POA: Insufficient documentation

## 2017-03-04 DIAGNOSIS — D509 Iron deficiency anemia, unspecified: Secondary | ICD-10-CM | POA: Diagnosis not present

## 2017-03-04 DIAGNOSIS — K219 Gastro-esophageal reflux disease without esophagitis: Secondary | ICD-10-CM | POA: Diagnosis not present

## 2017-03-04 LAB — CBC
HCT: 39.1 % (ref 36.0–46.0)
Hemoglobin: 13.1 g/dL (ref 12.0–15.0)
MCH: 26.8 pg (ref 26.0–34.0)
MCHC: 33.5 g/dL (ref 30.0–36.0)
MCV: 80.1 fL (ref 78.0–100.0)
PLATELETS: 338 10*3/uL (ref 150–400)
RBC: 4.88 MIL/uL (ref 3.87–5.11)
RDW: 13.8 % (ref 11.5–15.5)
WBC: 7 10*3/uL (ref 4.0–10.5)

## 2017-03-04 LAB — BASIC METABOLIC PANEL
Anion gap: 12 (ref 5–15)
BUN: 11 mg/dL (ref 6–20)
CALCIUM: 8.9 mg/dL (ref 8.9–10.3)
CO2: 20 mmol/L — ABNORMAL LOW (ref 22–32)
CREATININE: 0.88 mg/dL (ref 0.44–1.00)
Chloride: 99 mmol/L — ABNORMAL LOW (ref 101–111)
GFR calc non Af Amer: >60 mL/min (ref >60)
Glucose, Bld: 515 mg/dL (ref 65–99)
Potassium: 4 mmol/L (ref 3.5–5.1)
SODIUM: 131 mmol/L — AB (ref 135–145)

## 2017-03-04 LAB — I-STAT TROPONIN, ED: TROPONIN I, POC: 0 ng/mL (ref 0.00–0.08)

## 2017-03-04 NOTE — ED Triage Notes (Signed)
Pt states new onset left sided chest pain starting yesterday, describes it as if someone were sitting on her chest, that is constant. The pain radiates into the left arm and is 5/10. Yesterday had nausea, no nausea at present. Pt has had 2 cardiac caths in the past.

## 2017-03-05 ENCOUNTER — Encounter (HOSPITAL_COMMUNITY): Payer: Self-pay | Admitting: Internal Medicine

## 2017-03-05 ENCOUNTER — Telehealth (INDEPENDENT_AMBULATORY_CARE_PROVIDER_SITE_OTHER): Payer: Self-pay | Admitting: "Endocrinology

## 2017-03-05 ENCOUNTER — Observation Stay (HOSPITAL_BASED_OUTPATIENT_CLINIC_OR_DEPARTMENT_OTHER): Payer: 59

## 2017-03-05 DIAGNOSIS — E876 Hypokalemia: Secondary | ICD-10-CM | POA: Diagnosis not present

## 2017-03-05 DIAGNOSIS — E039 Hypothyroidism, unspecified: Secondary | ICD-10-CM | POA: Diagnosis not present

## 2017-03-05 DIAGNOSIS — R072 Precordial pain: Secondary | ICD-10-CM | POA: Diagnosis not present

## 2017-03-05 DIAGNOSIS — I160 Hypertensive urgency: Secondary | ICD-10-CM | POA: Diagnosis present

## 2017-03-05 DIAGNOSIS — I10 Essential (primary) hypertension: Secondary | ICD-10-CM | POA: Diagnosis not present

## 2017-03-05 DIAGNOSIS — F419 Anxiety disorder, unspecified: Secondary | ICD-10-CM | POA: Diagnosis not present

## 2017-03-05 DIAGNOSIS — E1165 Type 2 diabetes mellitus with hyperglycemia: Secondary | ICD-10-CM | POA: Diagnosis present

## 2017-03-05 DIAGNOSIS — I340 Nonrheumatic mitral (valve) insufficiency: Secondary | ICD-10-CM | POA: Diagnosis not present

## 2017-03-05 DIAGNOSIS — R079 Chest pain, unspecified: Secondary | ICD-10-CM | POA: Diagnosis not present

## 2017-03-05 DIAGNOSIS — R51 Headache: Secondary | ICD-10-CM | POA: Diagnosis not present

## 2017-03-05 DIAGNOSIS — R0789 Other chest pain: Secondary | ICD-10-CM | POA: Diagnosis not present

## 2017-03-05 DIAGNOSIS — F329 Major depressive disorder, single episode, unspecified: Secondary | ICD-10-CM | POA: Diagnosis not present

## 2017-03-05 LAB — TROPONIN I
Troponin I: <0.03 ng/mL (ref <0.03)
Troponin I: <0.03 ng/mL (ref <0.03)

## 2017-03-05 LAB — BASIC METABOLIC PANEL
Anion gap: 12 (ref 5–15)
BUN: 8 mg/dL (ref 6–20)
CALCIUM: 8.7 mg/dL — AB (ref 8.9–10.3)
CHLORIDE: 105 mmol/L (ref 101–111)
CO2: 20 mmol/L — ABNORMAL LOW (ref 22–32)
CREATININE: 0.81 mg/dL (ref 0.44–1.00)
GFR calc Af Amer: >60 mL/min (ref >60)
Glucose, Bld: 160 mg/dL — ABNORMAL HIGH (ref 65–99)
Potassium: 3.1 mmol/L — ABNORMAL LOW (ref 3.5–5.1)
SODIUM: 137 mmol/L (ref 135–145)

## 2017-03-05 LAB — CBG MONITORING, ED
GLUCOSE-CAPILLARY: 253 mg/dL — AB (ref 65–99)
GLUCOSE-CAPILLARY: 498 mg/dL — AB (ref 65–99)
GLUCOSE-CAPILLARY: 81 mg/dL (ref 65–99)
Glucose-Capillary: 167 mg/dL — ABNORMAL HIGH (ref 65–99)
Glucose-Capillary: 263 mg/dL — ABNORMAL HIGH (ref 65–99)

## 2017-03-05 LAB — CBC
HCT: 37.1 % (ref 36.0–46.0)
Hemoglobin: 12.5 g/dL (ref 12.0–15.0)
MCH: 26.9 pg (ref 26.0–34.0)
MCHC: 33.7 g/dL (ref 30.0–36.0)
MCV: 79.8 fL (ref 78.0–100.0)
PLATELETS: 271 10*3/uL (ref 150–400)
RBC: 4.65 MIL/uL (ref 3.87–5.11)
RDW: 13.9 % (ref 11.5–15.5)
WBC: 5.7 10*3/uL (ref 4.0–10.5)

## 2017-03-05 LAB — ECHOCARDIOGRAM COMPLETE
Height: 63 in
Weight: 3136 oz

## 2017-03-05 LAB — TSH: TSH: 0.938 u[IU]/mL (ref 0.350–4.500)

## 2017-03-05 LAB — GLUCOSE, CAPILLARY: Glucose-Capillary: 263 mg/dL — ABNORMAL HIGH (ref 65–99)

## 2017-03-05 LAB — LIPID PANEL
CHOLESTEROL: 147 mg/dL (ref 0–200)
HDL: 60 mg/dL (ref >40)
LDL CALC: 77 mg/dL (ref 0–99)
Total CHOL/HDL Ratio: 2.5 RATIO
Triglycerides: 52 mg/dL (ref <150)
VLDL: 10 mg/dL (ref 0–40)

## 2017-03-05 LAB — HIV ANTIBODY (ROUTINE TESTING W REFLEX): HIV SCREEN 4TH GENERATION: NONREACTIVE

## 2017-03-05 MED ORDER — INSULIN ASPART 100 UNIT/ML ~~LOC~~ SOLN
0.0000 [IU] | SUBCUTANEOUS | Status: DC
  Administered 2017-03-05 (×2): 5 [IU] via SUBCUTANEOUS
  Administered 2017-03-05: 2 [IU] via SUBCUTANEOUS
  Administered 2017-03-05 – 2017-03-06 (×2): 5 [IU] via SUBCUTANEOUS
  Administered 2017-03-06: 2 [IU] via SUBCUTANEOUS
  Administered 2017-03-06: 1 [IU] via SUBCUTANEOUS
  Filled 2017-03-05 (×3): qty 1

## 2017-03-05 MED ORDER — POTASSIUM CHLORIDE CRYS ER 20 MEQ PO TBCR
40.0000 meq | EXTENDED_RELEASE_TABLET | Freq: Once | ORAL | Status: AC
  Administered 2017-03-05: 40 meq via ORAL
  Filled 2017-03-05: qty 2

## 2017-03-05 MED ORDER — SODIUM CHLORIDE 0.9 % IV BOLUS (SEPSIS)
500.0000 mL | Freq: Once | INTRAVENOUS | Status: AC
  Administered 2017-03-05: 500 mL via INTRAVENOUS

## 2017-03-05 MED ORDER — CYCLOBENZAPRINE HCL 10 MG PO TABS: 10.0000 mg | ORAL_TABLET | Freq: Three times a day (TID) | ORAL | Status: DC | PRN

## 2017-03-05 MED ORDER — MORPHINE SULFATE (PF) 4 MG/ML IV SOLN
2.0000 mg | INTRAVENOUS | Status: DC | PRN
  Administered 2017-03-05 (×2): 2 mg via INTRAVENOUS
  Filled 2017-03-05 (×2): qty 1

## 2017-03-05 MED ORDER — ONDANSETRON HCL 4 MG PO TABS: 4.0000 mg | ORAL_TABLET | Freq: Four times a day (QID) | ORAL | Status: DC | PRN

## 2017-03-05 MED ORDER — NITROGLYCERIN 0.4 MG SL SUBL: 0.4000 mg | SUBLINGUAL_TABLET | SUBLINGUAL | Status: DC | PRN

## 2017-03-05 MED ORDER — HYDRALAZINE HCL 20 MG/ML IJ SOLN
10.0000 mg | INTRAMUSCULAR | Status: DC | PRN
  Administered 2017-03-05: 10 mg via INTRAVENOUS
  Filled 2017-03-05: qty 1

## 2017-03-05 MED ORDER — IRBESARTAN 300 MG PO TABS
150.0000 mg | ORAL_TABLET | Freq: Every day | ORAL | Status: DC
  Administered 2017-03-05 – 2017-03-06 (×2): 150 mg via ORAL
  Filled 2017-03-05 (×2): qty 1

## 2017-03-05 MED ORDER — ESCITALOPRAM OXALATE 10 MG PO TABS
20.0000 mg | ORAL_TABLET | Freq: Every day | ORAL | Status: DC
  Administered 2017-03-05 – 2017-03-06 (×2): 20 mg via ORAL
  Filled 2017-03-05: qty 2
  Filled 2017-03-05: qty 1

## 2017-03-05 MED ORDER — CARVEDILOL 3.125 MG PO TABS
3.1250 mg | ORAL_TABLET | Freq: Two times a day (BID) | ORAL | Status: DC
  Administered 2017-03-05 – 2017-03-06 (×2): 3.125 mg via ORAL
  Filled 2017-03-05 (×2): qty 1

## 2017-03-05 MED ORDER — LEVOTHYROXINE SODIUM 25 MCG PO TABS
25.0000 ug | ORAL_TABLET | Freq: Every day | ORAL | Status: DC
  Administered 2017-03-05 – 2017-03-06 (×2): 25 ug via ORAL
  Filled 2017-03-05 (×2): qty 1

## 2017-03-05 MED ORDER — ASPIRIN EC 81 MG PO TBEC
81.0000 mg | DELAYED_RELEASE_TABLET | Freq: Every day | ORAL | Status: DC
  Administered 2017-03-05 – 2017-03-06 (×2): 81 mg via ORAL
  Filled 2017-03-05 (×2): qty 1

## 2017-03-05 MED ORDER — ENOXAPARIN SODIUM 40 MG/0.4ML ~~LOC~~ SOLN: 40.0000 mg | SUBCUTANEOUS | Status: DC

## 2017-03-05 MED ORDER — ACETAMINOPHEN 325 MG PO TABS
650.0000 mg | ORAL_TABLET | Freq: Four times a day (QID) | ORAL | Status: DC | PRN
  Administered 2017-03-05 – 2017-03-06 (×4): 650 mg via ORAL
  Filled 2017-03-05 (×4): qty 2

## 2017-03-05 MED ORDER — PANTOPRAZOLE SODIUM 40 MG PO TBEC
40.0000 mg | DELAYED_RELEASE_TABLET | Freq: Every day | ORAL | Status: DC
  Administered 2017-03-05 – 2017-03-06 (×2): 40 mg via ORAL
  Filled 2017-03-05 (×2): qty 1

## 2017-03-05 MED ORDER — ALBUTEROL SULFATE (2.5 MG/3ML) 0.083% IN NEBU: 2.5000 mg | INHALATION_SOLUTION | Freq: Four times a day (QID) | RESPIRATORY_TRACT | Status: DC | PRN

## 2017-03-05 MED ORDER — INSULIN ASPART 100 UNIT/ML ~~LOC~~ SOLN
15.0000 [IU] | Freq: Once | SUBCUTANEOUS | Status: AC
  Administered 2017-03-05: 15 [IU] via SUBCUTANEOUS
  Filled 2017-03-05: qty 1

## 2017-03-05 MED ORDER — ONDANSETRON HCL 4 MG/2ML IJ SOLN
4.0000 mg | Freq: Four times a day (QID) | INTRAMUSCULAR | Status: DC | PRN
  Administered 2017-03-05 (×2): 4 mg via INTRAVENOUS
  Filled 2017-03-05 (×2): qty 2

## 2017-03-05 MED ORDER — ACETAMINOPHEN 650 MG RE SUPP: 650.0000 mg | Freq: Four times a day (QID) | RECTAL | Status: DC | PRN

## 2017-03-05 NOTE — ED Notes (Signed)
Pt resting comfortably at this time. Vital signs stable.

## 2017-03-05 NOTE — Progress Notes (Signed)
PROGRESS NOTE    Alexa [name]  VOZ:[id number] DOB: November 07, 1960 DOA: 03/04/2017 PCP: Elby Showers, MD   Chief Complaint  Patient presents with  . Chest Pain    Brief Narrative:  HPI on 03/05/2017 by Dr. Gean Birchwood Alexa [name] is a 57 y.o. female with history of diabetes mellitus type 2, hypertension presents to the ER with complaints of chest pain.  Patient has been having chest pain off and on for last few weeks.  Patient chest pain is retrosternal pressure-like with no associated shortness of breath productive cough nausea vomiting diaphoresis.  Pain improves with rest.  Has been having more frequent recently.  Interim history Admitted for chest pain. Cardiology consulted. Assessment & Plan   Chest pain -Has been ongoing since Monday, 03/01/2017 -Patient unable to provide specific details regarding her chest pain.  States it is been somewhat pressure-like in sensation and has not improved.  Although patient states she is currently not having chest pain. -Troponin currently <0.03 x2 -Cardiology consulted and appreciated, pending recommendations  Diabetes mellitus, type II, uncontrolled -Metformin, Trulicity, and Victoza held -Last hemoglobin A1c 11/17/2016 was 13.4  -Continue insulin sliding scale and CBG monitoring  Hypertension urgency/uncontrolled hypertension -Per patient, she reported to the nurse that she took half of a clonidine tablet yesterday which helped her with her hypertension as well as headache.  Of note clonidine is not on her home medication list.  Suspect taking this medication inappropriately has caused her to have rebound hypertension as well as headache. -Blood pressure on admission 184/104, has improved currently 157/87 -Continue Avapro, hydralazine as needed  Headache -Continue pain control, see discussion above  Hypokalemia -Currently being replaced, continue to monitor BMP  Hypothyroidism -Continue Synthroid  Depression/anxiety -Continue  Lexapro  DVT Prophylaxis  SCDs  Code Status: Ful  Family Communication: None at bedside  Disposition Plan: Observation, pending cardiology recommendations  Consultants Cardiology  Procedures  None  Antibiotics   Anti-infectives (From admission, onward)   None      Subjective:   Alexa [name] seen and examined today.  No longer having chest pain.  Currently complains of headache which she states is all over her head.  Cannot explain the character of her headache.  Denies current shortness of breath, abdominal pain, nausea or vomiting, diarrhea or constipation, dizziness.  Objective:   Vitals:   03/05/17 0949 03/05/17 0953 03/05/17 1000 03/05/17 1130  BP: (!) 180/159 (!) 165/91 (!) 157/87   Pulse: 82 95 90 97  Resp: 20 19 19 18   Temp:      TempSrc:      SpO2: 99% 100% 100% 100%  Weight:      Height:        Intake/Output Summary (Last 24 hours) at 03/05/2017 1232 Last data filed at 03/05/2017 0345 Gross per 24 hour  Intake 500 ml  Output -  Net 500 ml   Filed Weights   03/04/17 1609  Weight: 88.9 kg (196 lb)    Exam  General: Well developed, well nourished, NAD, appears stated age  HEENT: NCAT, mucous membranes moist.   Cardiovascular: S1 S2 auscultated, no rubs, murmurs or gallops. Regular rate and rhythm.  Respiratory: Clear to auscultation bilaterally with equal chest rise  Abdomen: Soft, obese, nontender, nondistended, + bowel sounds  Extremities: warm dry without cyanosis clubbing or edema  Neuro: AAOx3, nonfocal  Psych: Normal affect and demeanor with intact judgement and insight   Data Reviewed: I have personally reviewed following labs and imaging  studies  CBC: Recent Labs  Lab 03/04/17 1614 03/05/17 0350  WBC 7.0 5.7  HGB 13.1 12.5  HCT 39.1 37.1  MCV 80.1 79.8  PLT 338 573   Basic Metabolic Panel: Recent Labs  Lab 03/04/17 1614 03/05/17 0312  NA 131* 137  K 4.0 3.1*  CL 99* 105  CO2 20* 20*  GLUCOSE 515* 160*  BUN 11 8    CREATININE 0.88 0.81  CALCIUM 8.9 8.7*   GFR: Estimated Creatinine Clearance: 83 mL/min (by C-G formula based on SCr of 0.81 mg/dL). Liver Function Tests: No results for input(s): AST, ALT, ALKPHOS, BILITOT, PROT, ALBUMIN in the last 168 hours. No results for input(s): LIPASE, AMYLASE in the last 168 hours. No results for input(s): AMMONIA in the last 168 hours. Coagulation Profile: No results for input(s): INR, PROTIME in the last 168 hours. Cardiac Enzymes: Recent Labs  Lab 03/05/17 0312 03/05/17 0802  TROPONINI <0.03 <0.03   BNP (last 3 results) No results for input(s): PROBNP in the last 8760 hours. HbA1C: No results for input(s): HGBA1C in the last 72 hours. CBG: Recent Labs  Lab 03/05/17 0055 03/05/17 0429 03/05/17 0800 03/05/17 1146  GLUCAP 498* 81 167* 263*   Lipid Profile: No results for input(s): CHOL, HDL, LDLCALC, TRIG, CHOLHDL, LDLDIRECT in the last 72 hours. Thyroid Function Tests: No results for input(s): TSH, T4TOTAL, FREET4, T3FREE, THYROIDAB in the last 72 hours. Anemia Panel: No results for input(s): VITAMINB12, FOLATE, FERRITIN, TIBC, IRON, RETICCTPCT in the last 72 hours. Urine analysis:    Component Value Date/Time   COLORURINE YELLOW 10/19/2015 1435   APPEARANCEUR CLOUDY (A) 10/19/2015 1435   LABSPEC 1.024 10/19/2015 1435   PHURINE 5.5 10/19/2015 1435   GLUCOSEU >1000 (A) 10/19/2015 1435   HGBUR NEGATIVE 10/19/2015 1435   BILIRUBINUR negative 11/24/2016 1108   KETONESUR 15 (A) 10/19/2015 1435   PROTEINUR negative 11/24/2016 1108   PROTEINUR NEGATIVE 10/19/2015 1435   UROBILINOGEN 0.2 11/24/2016 1108   UROBILINOGEN 1.0 08/22/2010 2033   NITRITE negative 11/24/2016 1108   NITRITE POSITIVE (A) 10/19/2015 1435   LEUKOCYTESUR Negative 11/24/2016 1108   Sepsis Labs: @LABRCNTIP (procalcitonin:4,lacticidven:4)  )No results found for this or any previous visit (from the past 240 hour(s)).    Radiology Studies: Dg Chest 2 View  Result  Date: 03/04/2017 CLINICAL DATA:  Left chest pain EXAM: CHEST  2 VIEW COMPARISON:  October 19, 2015 FINDINGS: The heart size and mediastinal contours are within normal limits. There is no focal infiltrate, pulmonary edema, or pleural effusion. The visualized skeletal structures are unremarkable. IMPRESSION: No active cardiopulmonary disease. Electronically Signed   By: Abelardo Diesel M.D.   On: 03/04/2017 18:29     Scheduled Meds: . escitalopram  20 mg Oral Daily  . insulin aspart  0-9 Units Subcutaneous Q4H  . irbesartan  150 mg Oral Daily  . levothyroxine  25 mcg Oral QAC breakfast  . pantoprazole  40 mg Oral Daily   Continuous Infusions:   LOS: 0 days   Time Spent in minutes   30 minutes  Maxwel Meadowcroft D.O. on 03/05/2017 at 12:32 PM  Between 7am to 7pm - Pager - [phone]  After 7pm go to [email or web address] - password [email or web address]  And look for the night coverage person covering for me after hours  [facility] Office  [phone]

## 2017-03-05 NOTE — Plan of Care (Signed)
  Education: Knowledge of General Education information will improve 03/05/2017 1826 - Progressing by Imagene Gurney, RN

## 2017-03-05 NOTE — ED Notes (Signed)
Patient came to nurse first asking about the wait, I apologized for the long wait and explained to her that we've been extremely busy today. She stated that her blood pressure was 174 systolic and her husband had to bring her BP & pain meds from home

## 2017-03-05 NOTE — Progress Notes (Signed)
Echocardiogram 2D Echocardiogram has been performed.  Alexa [name] 03/05/2017, 2:32 PM

## 2017-03-05 NOTE — Telephone Encounter (Signed)
Forwarded to Dr. Tobe Sos.

## 2017-03-05 NOTE — ED Notes (Signed)
Lunch tray ordered 

## 2017-03-05 NOTE — H&P (Signed)
History and Physical    SUMIE [name] FWY:[id number] DOB: Oct 01, 1960 DOA: 03/04/2017  PCP: Elby Showers, MD  Patient coming from: Home.  Chief Complaint: Chest pain.  HPI: Alexa [name] is a 57 y.o. female with history of diabetes mellitus type 2, hypertension presents to the ER with complaints of chest pain.  Patient has been having chest pain off and on for last few weeks.  Patient chest pain is retrosternal pressure-like with no associated shortness of breath productive cough nausea vomiting diaphoresis.  Pain improves with rest.  Has been having more frequent recently.  ED Course: While waiting in the ER patient had markedly elevated patient to her my card is twice the dose she usually takes along with her patient's friend's clonidine.  In the ER patient was chest pain-free EKG shows nonspecific findings.  Troponin was negative chest x-ray was unremarkable.  Patient's blood sugar was 515 and was given NovoLog insulin 15 units of.  Patient has history of noncompliance with medications.  Review of Systems: As per HPI, rest all negative.   Past Medical History:  Diagnosis Date  . Anemia, iron deficiency   . Asthma   . Asthma, chronic   . CAD (coronary artery disease)   . Chronic insomnia   . Combined hyperlipidemia   . Depression   . Diabetes mellitus   . Diabetes mellitus type II   . Diabetic autonomic neuropathy ([place])   . DM type 2 with diabetic peripheral neuropathy ([place])   . Dyspepsia   . 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 Essential tremor   . Fatigue   . GERD (gastroesophageal reflux disease)   . GERD (gastroesophageal reflux disease)   . Glossitis   . Goiter   . Hyperparathyroidism , secondary, non-renal ([place])   . Hypertension   . Nonproliferative diabetic retinopathy associated with type 2 diabetes mellitus ([place])    RESOLVED  . Obesity, Class III, BMI 40-49.9 (morbid obesity) ([place])   . Pallor   . Tachycardia   . Thyroiditis, autoimmune   . Vitamin D deficiency       Past Surgical History:  Procedure Laterality Date  . CARDIAC CATHETERIZATION     x 2  . ROUX-EN-Y PROCEDURE    . TONSILLECTOMY       reports that  has never smoked. she has never used smokeless tobacco. She reports that she does not drink alcohol or use drugs.  Allergies  Allergen Reactions  . Theophyllines Palpitations    Family History  Problem Relation Age of Onset  . Thyroid disease Mother        Hypothyroid  . Diabetes Father        T2 DM  . Obesity Brother        250 pounds at a height of 76 inches.  . Thyroid disease Sister        Hypothyroid  . Diabetes Maternal Grandmother   . Colon cancer Other        great grandfather  . Liver disease Maternal Aunt        NASH  . Esophageal cancer Neg Hx   . Pancreatic cancer Neg Hx   . Kidney disease Neg Hx     Prior to Admission medications   Medication Sig Start Date End Date Taking? Authorizing Provider  albuterol (PROVENTIL HFA;VENTOLIN HFA) 108 (90 Base) MCG/ACT inhaler Inhale 2 puffs into the lungs every 6 (six) hours as needed for wheezing or shortness of breath. 11/24/16  Yes Baxley,  Cresenciano Lick, MD  albuterol (PROVENTIL) (2.5 MG/3ML) 0.083% nebulizer solution Take 3 mLs (2.5 mg total) by nebulization every 6 (six) hours as needed for wheezing or shortness of breath. 11/24/16  Yes Baxley, Cresenciano Lick, MD  Calcium Carbonate-Vitamin D (CALCIUM-VITAMIN D) 500-200 MG-UNIT per tablet Take 1 tablet by mouth 2 (two) times daily with a meal. Reported on 02/26/2015   Yes [provider]  conjugated estrogens (PREMARIN) vaginal cream Place 1 Applicatorful vaginally daily. Patient taking differently: Place 1 Applicatorful vaginally daily as needed (for dryness).  11/24/16  Yes Baxley, Cresenciano Lick, MD  cyclobenzaprine (FLEXERIL) 10 MG tablet Take 1 tablet (10 mg total) by mouth 3 (three) times daily as needed for muscle spasms. 11/24/16  Yes Baxley, Cresenciano Lick, MD  diphenhydrAMINE (BENADRYL) 25 MG tablet Take 25-50 mg by mouth 2 (two)  times daily as needed for allergies or sleep.   Yes [provider]  escitalopram (LEXAPRO) 20 MG tablet Take 1 tablet (20 mg total) by mouth daily. 11/24/16  Yes Baxley, Cresenciano Lick, MD  Ferrous Sulfate (SLOW FE PO) Take 1 tablet by mouth 3 (three) times a week.   Yes [provider]  levothyroxine (SYNTHROID, LEVOTHROID) 25 MCG tablet Take one tablet daily. Patient taking differently: Take 25 mcg by mouth daily before breakfast.  11/24/16 11/24/17 Yes Sherrlyn Hock, MD  metFORMIN (GLUCOPHAGE) 1000 MG tablet Take 1,000 mg by mouth 2 (two) times daily with a meal.   Yes [provider]  Multiple Vitamin (MULTIVITAMIN) tablet Take 1 tablet by mouth daily. Reported on 02/26/2015   Yes [provider]  pantoprazole (PROTONIX) 40 MG tablet Take 1 tablet (40 mg total) by mouth daily. 10/01/15  Yes Sherrlyn Hock, MD  telmisartan (MICARDIS) 40 MG tablet TAKE 1 TABLET BY MOUTH EACH MORNING. Patient taking differently: Take 40 mg by mouth daily.  11/24/16 11/24/17 Yes Sherrlyn Hock, MD  traMADol (ULTRAM) 50 MG tablet Take 1 tablet (50 mg total) by mouth every 8 (eight) hours as needed. Patient taking differently: Take 50 mg by mouth every 8 (eight) hours as needed (for pain).  11/24/16  Yes Baxley, Cresenciano Lick, MD  VICTOZA 18 MG/3ML SOPN INJECT 1.8 MG DAILY Patient taking differently: Inject 1.8 mg into the skin once a day 12/05/15  Yes Sherrlyn Hock, MD  meloxicam (MOBIC) 15 MG tablet Take 1 tablet (15 mg total) by mouth daily. Patient not taking: Reported on 03/04/2017 01/14/16   Elby Showers, MD  metFORMIN (GLUCOPHAGE) 500 MG tablet TAKE 12 TABLETS BY MOUTH AT BREAKFAST AND TWO AT [facility]. Patient not taking: Reported on 03/04/2017 11/24/16 11/24/17  Sherrlyn Hock, MD  TRULICITY 9.24 QA/8.3MH SOPN Take one subcutaneous injection of 9.62 mg of Trulicity every week. Patient not taking: Reported on 03/04/2017 11/24/16 11/24/17  Sherrlyn Hock, MD    Physical  Exam: Vitals:   03/04/17 1948 03/04/17 2205 03/05/17 0030 03/05/17 0045  BP: (!) 184/104 (!) 167/99 (!) 166/98 (!) 175/81  Pulse: 82 80 86 73  Resp: 14 16 (!) 22 13  Temp:      TempSrc:      SpO2: 99% 100% 98% 100%  Weight:      Height:          Constitutional: Moderately built and nourished. Vitals:   03/04/17 1948 03/04/17 2205 03/05/17 0030 03/05/17 0045  BP: (!) 184/104 (!) 167/99 (!) 166/98 (!) 175/81  Pulse: 82 80 86 73  Resp: 14 16 (!) 22  13  Temp:      TempSrc:      SpO2: 99% 100% 98% 100%  Weight:      Height:       Eyes: Anicteric no pallor. ENMT: No discharge from the ears eyes nose or mouth. Neck: No JVD appreciated no mass felt. Respiratory: No rhonchi or crepitations. Cardiovascular: S1-S2 heard no murmurs appreciated. Abdomen: Soft nontender bowel sounds present. Musculoskeletal: No edema.  No joint effusion. Skin: No rash.  Skin appears warm. Neurologic: Alert awake oriented to time place and person.  Moves all extremities 5 x 5. Psychiatric: Appears normal.  Normal affect.   Labs on Admission: I have personally reviewed following labs and imaging studies  CBC: Recent Labs  Lab 03/04/17 1614  WBC 7.0  HGB 13.1  HCT 39.1  MCV 80.1  PLT 962   Basic Metabolic Panel: Recent Labs  Lab 03/04/17 1614  NA 131*  K 4.0  CL 99*  CO2 20*  GLUCOSE 515*  BUN 11  CREATININE 0.88  CALCIUM 8.9   GFR: Estimated Creatinine Clearance: 76.4 mL/min (by C-G formula based on SCr of 0.88 mg/dL). Liver Function Tests: No results for input(s): AST, ALT, ALKPHOS, BILITOT, PROT, ALBUMIN in the last 168 hours. No results for input(s): LIPASE, AMYLASE in the last 168 hours. No results for input(s): AMMONIA in the last 168 hours. Coagulation Profile: No results for input(s): INR, PROTIME in the last 168 hours. Cardiac Enzymes: No results for input(s): CKTOTAL, CKMB, CKMBINDEX, TROPONINI in the last 168 hours. BNP (last 3 results) No results for input(s):  PROBNP in the last 8760 hours. HbA1C: No results for input(s): HGBA1C in the last 72 hours. CBG: Recent Labs  Lab 03/05/17 0055  GLUCAP 498*   Lipid Profile: No results for input(s): CHOL, HDL, LDLCALC, TRIG, CHOLHDL, LDLDIRECT in the last 72 hours. Thyroid Function Tests: No results for input(s): TSH, T4TOTAL, FREET4, T3FREE, THYROIDAB in the last 72 hours. Anemia Panel: No results for input(s): VITAMINB12, FOLATE, FERRITIN, TIBC, IRON, RETICCTPCT in the last 72 hours. Urine analysis:    Component Value Date/Time   COLORURINE YELLOW 10/19/2015 1435   APPEARANCEUR CLOUDY (A) 10/19/2015 1435   LABSPEC 1.024 10/19/2015 1435   PHURINE 5.5 10/19/2015 1435   GLUCOSEU >1000 (A) 10/19/2015 1435   HGBUR NEGATIVE 10/19/2015 1435   BILIRUBINUR negative 11/24/2016 1108   KETONESUR 15 (A) 10/19/2015 1435   PROTEINUR negative 11/24/2016 1108   PROTEINUR NEGATIVE 10/19/2015 1435   UROBILINOGEN 0.2 11/24/2016 1108   UROBILINOGEN 1.0 08/22/2010 2033   NITRITE negative 11/24/2016 1108   NITRITE POSITIVE (A) 10/19/2015 1435   LEUKOCYTESUR Negative 11/24/2016 1108   Sepsis Labs: @LABRCNTIP (procalcitonin:4,lacticidven:4) )No results found for this or any previous visit (from the past 240 hour(s)).   Radiological Exams on Admission: Dg Chest 2 View  Result Date: 03/04/2017 CLINICAL DATA:  Left chest pain EXAM: CHEST  2 VIEW COMPARISON:  October 19, 2015 FINDINGS: The heart size and mediastinal contours are within normal limits. There is no focal infiltrate, pulmonary edema, or pleural effusion. The visualized skeletal structures are unremarkable. IMPRESSION: No active cardiopulmonary disease. Electronically Signed   By: Abelardo Diesel M.D.   On: 03/04/2017 18:29    EKG: Independently reviewed.  Normal sinus rhythm with nonspecific ST changes with RSR pattern in the anterior leads.  Assessment/Plan Principal Problem:   Chest pain Active Problems:   Asthma, chronic   Anemia, iron  deficiency   Hypertensive urgency   Uncontrolled type 2 diabetes  mellitus with hyperglycemia ([place])    1. Chest pain -since patient has exertional chest pain concerning for angina.  Presently chest pain-free.  We will cycle cardiac markers check 2D echo.  Aspirin.  PRN nitroglycerin.  Consult cardiology in a.m. 2. Diabetes mellitus type 2 uncontrolled last hemoglobin A1c in October 2018 was 13.  Patient admits to being noncompliant with her medications.  Patient received 15 units of NovoLog in the ER.  Would keep patient on sliding scale coverage for now since patient may be n.p.o. for procedure.  Closely follow CBGs.  Patient received 1 L fluid bolus. 3. Hypertensive urgency -we will keep patient on PRN IV hydralazine and continue home medication Micardis.  Patient also did take 1 dose of clonidine from a friend.  Closely follow blood pressure trends.   DVT prophylaxis: Lovenox. Code Status: Full code. Family Communication: Discussed with patient. Disposition Plan: Home. Consults called: None. Admission status: Observation.   Rise Patience MD [facility] Pager [phone].  If 7PM-7AM, please contact night-coverage [email or web address] Password [email or web address]  03/05/2017, 2:14 AM
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
## 2017-03-05 NOTE — Consult Note (Signed)
Cardiology Consultation:   Patient ID: Alexa [name]; [id number]; 23-Jun-1960   Admit date: 03/04/2017 Date of Consult: 03/05/2017  Primary Care Provider: Elby Showers, MD Primary Cardiologist: [place]  Patient Profile:   Alexa [name] is a 57 y.o. female with a hx of medication non-compliance, non-obstructive CAD per cath 2003 and subsequent cath in 2007 with minor obstructive disease (20-30% narrowing of the LAD ostially and 30-40% mid LAD stenosis), uncontrolled HTN, uncontrolled DM II, HLD, obesity, peripheral neuropathy, GERD, asthma, and hypothyroidism who is being seen today for the evaluation of chest pain at the request of Dr. Ree Kida.   History of Present Illness:   Alexa [name] is a 57yo F with a hx outlined above who presented to the ED on 03/04/17 with c/o of worsening intermittent, mid-sternal chest pain/pressure which radiated to her left arm. She states that she has been experiencing these types of symptoms for many years and has had two cardiac caths that cannot explain her chest discomfort (non-obstructive with normal LV function 2003, minor occlusive disease in 2007). She states that the pain, over the years, is worsened with activity and is relieved by rest. On Monday (03/01/17) she was in her usual state of health when she began experiencing chest discomfort that was more intense than that she has felt in the past. She continued on with her day/week as usual despite the constant discomfort. On Thursday (03/04/17), she proceeded to work (as an Therapist, sports) where they took her BP and it was markedly elevated. She then went to the ED for further evaluation. She denies any associated symptoms such as N/V, diaphoresis, SOB, jaw pain or numbness and/or tingling in the arms or hands. She cannot associate the pain to certain activities or food ingestion and seems vague with her descriptions. She admits to not taking any of her medications for quite sometime for reasons unknown. Per her  endocrinologist MD office note, the patient has psychological issues, ongoing for many years, that has adversely affected her eating, unwillingness to exercise, and her non-compliance with medications.             In the ED, her troponin levels have been negative x 2 (<0.03, <0.03). Her EKG unremarkable with BBB, no evidence of acute ischemia. A CXR was performed that showed no active cardiopulmonary disease. Her BP's were and have been markedly elevated since admission, 180/108>184/104>167/99.   Past Medical History:  Diagnosis Date  . Anemia, iron deficiency   . Asthma   . Asthma, chronic   . CAD (coronary artery disease)   . Chronic insomnia   . Combined hyperlipidemia   . Depression   . Diabetes mellitus   . Diabetes mellitus type II   . Diabetic autonomic neuropathy ([place])   . DM type 2 with diabetic peripheral neuropathy ([place])   . Dyspepsia   . Elevated homocysteine ([place])   . Essential tremor   . Fatigue   . GERD (gastroesophageal reflux disease)   . GERD (gastroesophageal reflux disease)   . Glossitis   . Goiter   . Hyperparathyroidism , secondary, non-renal ([place])   . Hypertension   . Nonproliferative diabetic retinopathy associated with type 2 diabetes mellitus ([place])    RESOLVED  . Obesity, Class III, BMI 40-49.9 (morbid obesity) ([name])   . Pallor   . Tachycardia   . Thyroiditis, autoimmune   . Vitamin D deficiency     Past Surgical History:  Procedure Laterality Date  . CARDIAC CATHETERIZATION  x 2  . ROUX-EN-Y PROCEDURE    . TONSILLECTOMY       Prior to Admission medications   Medication Sig Start Date End Date Taking? Authorizing Provider  albuterol (PROVENTIL HFA;VENTOLIN HFA) 108 (90 Base) MCG/ACT inhaler Inhale 2 puffs into the lungs every 6 (six) hours as needed for wheezing or shortness of breath. 11/24/16  Yes Baxley, Cresenciano Lick, MD  albuterol (PROVENTIL) (2.5 MG/3ML) 0.083% nebulizer solution Take 3 mLs (2.5 mg total) by nebulization every 6 (six) hours  as needed for wheezing or shortness of breath. 11/24/16  Yes Baxley, Cresenciano Lick, MD  Calcium Carbonate-Vitamin D (CALCIUM-VITAMIN D) 500-200 MG-UNIT per tablet Take 1 tablet by mouth 2 (two) times daily with a meal. Reported on 02/26/2015   Yes [provider]  conjugated estrogens (PREMARIN) vaginal cream Place 1 Applicatorful vaginally daily. Patient taking differently: Place 1 Applicatorful vaginally daily as needed (for dryness).  11/24/16  Yes Baxley, Cresenciano Lick, MD  cyclobenzaprine (FLEXERIL) 10 MG tablet Take 1 tablet (10 mg total) by mouth 3 (three) times daily as needed for muscle spasms. 11/24/16  Yes Baxley, Cresenciano Lick, MD  diphenhydrAMINE (BENADRYL) 25 MG tablet Take 25-50 mg by mouth 2 (two) times daily as needed for allergies or sleep.   Yes [provider]  escitalopram (LEXAPRO) 20 MG tablet Take 1 tablet (20 mg total) by mouth daily. 11/24/16  Yes Baxley, Cresenciano Lick, MD  Ferrous Sulfate (SLOW FE PO) Take 1 tablet by mouth 3 (three) times a week.   Yes [provider]  levothyroxine (SYNTHROID, LEVOTHROID) 25 MCG tablet Take one tablet daily. Patient taking differently: Take 25 mcg by mouth daily before breakfast.  11/24/16 11/24/17 Yes Sherrlyn Hock, MD  metFORMIN (GLUCOPHAGE) 1000 MG tablet Take 1,000 mg by mouth 2 (two) times daily with a meal.   Yes [provider]  Multiple Vitamin (MULTIVITAMIN) tablet Take 1 tablet by mouth daily. Reported on 02/26/2015   Yes [provider]  pantoprazole (PROTONIX) 40 MG tablet Take 1 tablet (40 mg total) by mouth daily. 10/01/15  Yes Sherrlyn Hock, MD  telmisartan (MICARDIS) 40 MG tablet TAKE 1 TABLET BY MOUTH EACH MORNING. Patient taking differently: Take 40 mg by mouth daily.  11/24/16 11/24/17 Yes Sherrlyn Hock, MD  traMADol (ULTRAM) 50 MG tablet Take 1 tablet (50 mg total) by mouth every 8 (eight) hours as needed. Patient taking differently: Take 50 mg by mouth every 8 (eight) hours as needed (for  pain).  11/24/16  Yes Baxley, Cresenciano Lick, MD  VICTOZA 18 MG/3ML SOPN INJECT 1.8 MG DAILY Patient taking differently: Inject 1.8 mg into the skin once a day 12/05/15  Yes Sherrlyn Hock, MD  meloxicam (MOBIC) 15 MG tablet Take 1 tablet (15 mg total) by mouth daily. Patient not taking: Reported on 03/04/2017 01/14/16   Elby Showers, MD  metFORMIN (GLUCOPHAGE) 500 MG tablet TAKE 12 TABLETS BY MOUTH AT BREAKFAST AND TWO AT [facility]. Patient not taking: Reported on 03/04/2017 11/24/16 11/24/17  Sherrlyn Hock, MD  TRULICITY 9.38 HW/2.9HB SOPN Take one subcutaneous injection of 7.16 mg of Trulicity every week. Patient not taking: Reported on 03/04/2017 11/24/16 11/24/17  Sherrlyn Hock, MD    Inpatient Medications: Scheduled Meds: . escitalopram  20 mg Oral Daily  . insulin aspart  0-9 Units Subcutaneous Q4H  . irbesartan  150 mg Oral Daily  . levothyroxine  25 mcg Oral QAC breakfast  . pantoprazole  40 mg Oral Daily  Continuous Infusions:  PRN Meds: acetaminophen **OR** acetaminophen, albuterol, cyclobenzaprine, hydrALAZINE, morphine injection, nitroGLYCERIN, ondansetron **OR** ondansetron (ZOFRAN) IV  Allergies:    Allergies  Allergen Reactions  . Theophyllines Palpitations    Social History:   Social History   Socioeconomic History  . Marital status: Married    Spouse name: Not on file  . Number of children: Not on file  . Years of education: Not on file  . Highest education level: Not on file  Social Needs  . Financial resource strain: Not on file  . Food insecurity - worry: Not on file  . Food insecurity - inability: Not on file  . Transportation needs - medical: Not on file  . Transportation needs - non-medical: Not on file  Occupational History  . Not on file  Tobacco Use  . Smoking status: Never Smoker  . Smokeless tobacco: Never Used  Substance and Sexual Activity  . Alcohol use: No    Alcohol/week: 0.0 oz  . Drug use: No  . Sexual activity: Not on file    Other Topics Concern  . Not on file  Social History Narrative  . Not on file    Family History:   Family History  Problem Relation Age of Onset  . Thyroid disease Mother        Hypothyroid  . Diabetes Father        T2 DM  . Obesity Brother        250 pounds at a height of 76 inches.  . Thyroid disease Sister        Hypothyroid  . Diabetes Maternal Grandmother   . Colon cancer Other        great grandfather  . Liver disease Maternal Aunt        NASH  . Esophageal cancer Neg Hx   . Pancreatic cancer Neg Hx   . Kidney disease Neg Hx    Family Status:  Family Status  Relation Name Status  . Mother  Alive  . Father  Alive  . Sister  Alive  . Brother  Alive  . Brother  Alive  . Brother  Alive  . Sister  Alive  . MGM  (Not Specified)  . Other  (Not Specified)  . Mat Aunt  (Not Specified)  . Neg Hx  (Not Specified)    ROS:  Please see the history of present illness.  All other ROS reviewed and negative.     Physical Exam/Data:   Vitals:   03/05/17 1000 03/05/17 1130 03/05/17 1200 03/05/17 1245  BP: (!) 157/87  (!) 151/80 (!) 174/52  Pulse: 90 97 76 85  Resp: 19 18 (!) 22 14  Temp:      TempSrc:      SpO2: 100% 100% 94% 99%  Weight:      Height:        Intake/Output Summary (Last 24 hours) at 03/05/2017 1320 Last data filed at 03/05/2017 0345 Gross per 24 hour  Intake 500 ml  Output -  Net 500 ml   Filed Weights   03/04/17 1609  Weight: 196 lb (88.9 kg)   Body mass index is 34.72 kg/m.   General: Well developed, well nourished, NAD Skin: Warm, dry, intact  Head: Normocephalic, atraumatic, clear, moist mucus membranes. Neck: Negative for carotid bruits. No JVD Lungs:Clear to ausculation bilaterally. No wheezes, rales, or rhonchi. Breathing is unlabored. Cardiovascular: RRR with S1 S2. No murmurs, rubs, or gallops Abdomen: Soft, non-tender, non-distended with normoactive bowel  sounds. No obvious abdominal masses. MSK: Strength and tone appear normal  for age. 5/5 in all extremities Extremities: No edema. No clubbing or cyanosis. DP/PT pulses 2+ bilaterally Neuro: Alert and oriented. No focal deficits. No facial asymmetry. MAE spontaneously. Psych: Responds to questions appropriately with normal affect.     EKG: The EKG was personally reviewed and demonstrates: 03/04/17 NSR HR 90 BBB with no evidence of acute ischemia   Telemetry: Telemetry was personally reviewed and demonstrates: 03/05/17 NSR HR 84    Relevant CV Studies:  ECHO:  03/05/17 Study Conclusions  - Left ventricle: The cavity size was normal. Wall thickness was   normal. Systolic function was normal. The estimated ejection   fraction was in the range of 60% to 65%. Wall motion was normal;   there were no regional wall motion abnormalities. Doppler   parameters are consistent with abnormal left ventricular   relaxation (grade 1 diastolic dysfunction). - Mitral valve: There was mild regurgitation.  CATH:  09/09/2001: [facility] 1. Mild coronary artery disease by angiogram.  2. Normal left ventricular systolic function.   06/26/2005: [facility] ANGIOGRAPHIC DATA:  1.  The left main coronary artery is free of critical disease.  2.  The left anterior descending artery demonstrates minimal pinching at the ostium of 20-30% at most.  After the origin of two diagonal branches, there is an area of about 30-40% narrowing which is really fairly unchanged from previous study.  The distal LAD is without critical narrowing. The diagonals have minimal luminal irregularity but are without significant focal stenosis.  3.  The circumflex provides an insignificant first marginal branch then provides a large second marginal branch; and a posterolateral branch.There is perhaps minimal plaquing in the continuation after the large marginal takeoff, but no critical stenoses.  4. The right coronary artery is a large-caliber vessel.  It provides an acute marginal which supplies the distal portion of the  inferior wall; and a smaller PDA posterolateral system.  Other than minimal irregularity in the posterior descending and posterolateral branch, the right coronary is free of critical disease.  5. Ventriculography in the RAO projection is under opacified.  There does appear to potentially be some mild left ventricular hypertrophy,but this is difficult to be certain. Global systolic function is vigorous.   CONCLUSION:  1.  Preserved overall left ventricular function.  2.  A 20-30% narrowing of the left anterior descending artery ostially and      30-40% mid stenosis.  Laboratory Data:  Chemistry Recent Labs  Lab 03/04/17 1614 03/05/17 0312  NA 131* 137  K 4.0 3.1*  CL 99* 105  CO2 20* 20*  GLUCOSE 515* 160*  BUN 11 8  CREATININE 0.88 0.81  CALCIUM 8.9 8.7*  GFRNONAA >60 >60  GFRAA >60 >60  ANIONGAP 12 12    Total Protein  Date Value Ref Range Status  11/17/2016 6.4 6.1 - 8.1 g/dL Final   Albumin  Date Value Ref Range Status  05/27/2016 4.1 3.6 - 5.1 g/dL Final   AST  Date Value Ref Range Status  11/17/2016 14 10 - 35 U/L Final   ALT  Date Value Ref Range Status  11/17/2016 13 6 - 29 U/L Final   Alkaline Phosphatase  Date Value Ref Range Status  05/27/2016 85 33 - 130 U/L Final   Total Bilirubin  Date Value Ref Range Status  11/17/2016 0.5 0.2 - 1.2 mg/dL Final   Hematology Recent Labs  Lab 03/04/17 1614 03/05/17 0350  WBC 7.0  5.7  RBC 4.88 4.65  HGB 13.1 12.5  HCT 39.1 37.1  MCV 80.1 79.8  MCH 26.8 26.9  MCHC 33.5 33.7  RDW 13.8 13.9  PLT 338 271   Cardiac Enzymes Recent Labs  Lab 03/05/17 0312 03/05/17 0802  TROPONINI <0.03 <0.03    Recent Labs  Lab 03/04/17 1630  TROPIPOC 0.00    BNPNo results for input(s): BNP, PROBNP in the last 168 hours.  DDimer No results for input(s): DDIMER in the last 168 hours. TSH:  Lab Results  Component Value Date   TSH 1.54 11/17/2016   Lipids: Lab Results  Component Value Date   CHOL 173  11/17/2016   HDL 71 11/17/2016   LDLCALC 98 05/27/2016   TRIG 91 11/17/2016   CHOLHDL 2.4 11/17/2016   HgbA1c: Lab Results  Component Value Date   HGBA1C 13.4 (H) 11/17/2016    Radiology/Studies:  Dg Chest 2 View  Result Date: 03/04/2017 CLINICAL DATA:  Left chest pain EXAM: CHEST  2 VIEW COMPARISON:  October 19, 2015 FINDINGS: The heart size and mediastinal contours are within normal limits. There is no focal infiltrate, pulmonary edema, or pleural effusion. The visualized skeletal structures are unremarkable. IMPRESSION: No active cardiopulmonary disease. Electronically Signed   By: Abelardo Diesel M.D.   On: 03/04/2017 18:29   Assessment and Plan:   1.Chest Pain: -Ongoing chest pain since Monday, 2/4/19n with negative troponin level x 2, <0.03, <0.03 -Echocardiogram with normal EF of 60-65% and no WMA performed today, 03/05/17 -EKG unremarkable  -Last cath 2007 with 20-40% LAD lesion  -Will plan for OP Myoview stress for further cardiac workup given her low risk symptoms.  -Will start ASA and add low dose BB -On ARB  2. DM II: -Per IM, SSI -Uncontrolled, last Hb A1C 11/17/16 13.4 -Glucose on admission, 515  3. HTN: -Elevated but improved since admission after medications, 174/52>151/80>157/87 -Pt reports taking 1/2 clonidine tablet yesterday which helped with HTN and headache>>>suspect that she is experiencing some rebound HTN related to this -On home dose Avapro>> hydralazine PRN -Coreg 3.125 added   4. Hypothyroid: -On synthroid -Will need to check TSH   5. Hypokalemia:  -K+, 3.1 -Replaced per IM -Monitor BMET  For questions or updates, please contact [place] Please consult [email or web address] for contact info under Cardiology/STEMI.   Signed, Kathyrn Drown NP-C [facility] Pager: [phone] 03/05/2017 1:20 PM   I have examined the patient and reviewed assessment and plan and discussed with patient.  Agree with above as stated.  Negative troponin.  Normal  echo.  I personally reviewed the ECG.  RBBB- no clear ischemic changes.  Several atypical features of the pain, most notably that hse has had this for several years and has had prior negative w/u.    She needs to control her RF better.  Would plan for outpatient stress test when she is feeling better.  SHe continues to have nausea and may do better on the treadmill when she feels better.    Larae Grooms

## 2017-03-05 NOTE — Telephone Encounter (Signed)
°  Who's calling (name and relationship to patient) : Glendene (Self) Best contact number: [phone] Provider they see: Dr. Tobe Sos Reason for call: Pt wanted Dr. Tobe Sos to know that she went to the ER last night and is still there. They are going to do a cardiac work-up today. Please inform Dr. Tobe Sos

## 2017-03-05 NOTE — Plan of Care (Signed)
  Pain Managment: General experience of comfort will improve 03/05/2017 2344 - Progressing by Theador Hawthorne, RN   Skin Integrity: Risk for impaired skin integrity will decrease 03/05/2017 2344 - Progressing by Theador Hawthorne, RN   Safety: Ability to remain free from injury will improve 03/05/2017 2344 - Progressing by Theador Hawthorne, RN

## 2017-03-06 ENCOUNTER — Other Ambulatory Visit: Payer: Self-pay

## 2017-03-06 DIAGNOSIS — D509 Iron deficiency anemia, unspecified: Secondary | ICD-10-CM

## 2017-03-06 DIAGNOSIS — J45909 Unspecified asthma, uncomplicated: Secondary | ICD-10-CM

## 2017-03-06 DIAGNOSIS — I160 Hypertensive urgency: Secondary | ICD-10-CM

## 2017-03-06 DIAGNOSIS — E1165 Type 2 diabetes mellitus with hyperglycemia: Secondary | ICD-10-CM | POA: Diagnosis not present

## 2017-03-06 DIAGNOSIS — E039 Hypothyroidism, unspecified: Secondary | ICD-10-CM | POA: Diagnosis not present

## 2017-03-06 DIAGNOSIS — R739 Hyperglycemia, unspecified: Secondary | ICD-10-CM

## 2017-03-06 DIAGNOSIS — F329 Major depressive disorder, single episode, unspecified: Secondary | ICD-10-CM | POA: Diagnosis not present

## 2017-03-06 DIAGNOSIS — I10 Essential (primary) hypertension: Secondary | ICD-10-CM | POA: Diagnosis not present

## 2017-03-06 DIAGNOSIS — R072 Precordial pain: Secondary | ICD-10-CM

## 2017-03-06 DIAGNOSIS — F419 Anxiety disorder, unspecified: Secondary | ICD-10-CM | POA: Diagnosis not present

## 2017-03-06 DIAGNOSIS — R51 Headache: Secondary | ICD-10-CM | POA: Diagnosis not present

## 2017-03-06 DIAGNOSIS — R079 Chest pain, unspecified: Secondary | ICD-10-CM | POA: Diagnosis not present

## 2017-03-06 DIAGNOSIS — R0789 Other chest pain: Secondary | ICD-10-CM | POA: Diagnosis not present

## 2017-03-06 DIAGNOSIS — E876 Hypokalemia: Secondary | ICD-10-CM | POA: Diagnosis not present

## 2017-03-06 LAB — GLUCOSE, CAPILLARY
Glucose-Capillary: 139 mg/dL — ABNORMAL HIGH (ref 65–99)
Glucose-Capillary: 179 mg/dL — ABNORMAL HIGH (ref 65–99)
Glucose-Capillary: 258 mg/dL — ABNORMAL HIGH (ref 65–99)

## 2017-03-06 LAB — BASIC METABOLIC PANEL
Anion gap: 11 (ref 5–15)
BUN: 10 mg/dL (ref 6–20)
CHLORIDE: 106 mmol/L (ref 101–111)
CO2: 21 mmol/L — AB (ref 22–32)
CREATININE: 0.87 mg/dL (ref 0.44–1.00)
Calcium: 8.7 mg/dL — ABNORMAL LOW (ref 8.9–10.3)
GFR calc Af Amer: >60 mL/min (ref >60)
GFR calc non Af Amer: >60 mL/min (ref >60)
Glucose, Bld: 143 mg/dL — ABNORMAL HIGH (ref 65–99)
Potassium: 4.1 mmol/L (ref 3.5–5.1)
SODIUM: 138 mmol/L (ref 135–145)

## 2017-03-06 LAB — CBC
HCT: 37.7 % (ref 36.0–46.0)
HEMOGLOBIN: 12.4 g/dL (ref 12.0–15.0)
MCH: 26.8 pg (ref 26.0–34.0)
MCHC: 32.9 g/dL (ref 30.0–36.0)
MCV: 81.4 fL (ref 78.0–100.0)
Platelets: 316 10*3/uL (ref 150–400)
RBC: 4.63 MIL/uL (ref 3.87–5.11)
RDW: 14.1 % (ref 11.5–15.5)
WBC: 7.5 10*3/uL (ref 4.0–10.5)

## 2017-03-06 LAB — MAGNESIUM: MAGNESIUM: 2 mg/dL (ref 1.7–2.4)

## 2017-03-06 MED ORDER — CARVEDILOL 3.125 MG PO TABS: 3.1250 mg | ORAL_TABLET | Freq: Two times a day (BID) | ORAL | 0 refills | Status: DC

## 2017-03-06 MED ORDER — ASPIRIN 81 MG PO TBEC: 81.0000 mg | DELAYED_RELEASE_TABLET | Freq: Every day | ORAL | 0 refills | Status: DC

## 2017-03-06 MED ORDER — ONE-DAILY MULTI VITAMINS PO TABS: 1.0000 | ORAL_TABLET | Freq: Every day | ORAL | 0 refills | Status: AC

## 2017-03-06 NOTE — Discharge Summary (Signed)
Physician Discharge Summary  Alexa [name] AOZ:[id number] DOB: 26-Dec-1960 DOA: 03/04/2017  PCP: Elby Showers, MD  Admit date: 03/04/2017 Discharge date: 03/06/2017  Time spent: 45 minutes  Recommendations for Outpatient Follow-up:  Patient will be discharged to home.  Patient will need to follow up with primary care provider within one week of discharge.  Follow up with Dr. Irish Lack, cardiology. Patient should continue medications as prescribed.  Patient should follow a heart healthy diet.   Discharge Diagnoses:  Chest pain Diabetes mellitus, type II, uncontrolled Hypertension urgency/uncontrolled hypertension Headache Hypokalemia Hypothyroidism Depression/anxiety  Discharge Condition: Stable  Diet recommendation: heart healthy  Filed Weights   03/04/17 1609 03/05/17 1826 03/06/17 0613  Weight: 88.9 kg (196 lb) 89 kg (196 lb 5 oz) 88.6 kg (195 lb 4.8 oz)    History of present illness:  on 03/05/2017 by Dr. Cherre Robins a 57 y.o.femalewithhistory of diabetes mellitus type 2, hypertension presents to the ER with complaints of chest pain. Patient has been having chest pain off and on for last few weeks. Patient chest pain is retrosternal pressure-like with no associated shortness of breath productive cough nausea vomiting diaphoresis. Pain improves with rest. Has been having more frequent recently.  Hospital Course:  Chest pain -Has been ongoing since Monday, 03/01/2017 -Patient unable to provide specific details regarding her chest pain.  States it is been somewhat pressure-like in sensation and has not improved.  Although patient states she is currently not having chest pain. -Troponin currently <0.03 x3 -Cardiology consulted and appreciated, plan for outpatient stress test -Echocardiogram EF 60-65%, G1DD, no RWMA  Diabetes mellitus, type II, uncontrolled -Metformin, Victoza held- continue upon discharge -Last hemoglobin A1c 11/17/2016 was 13.4    -Follow up with endocrinology  Hypertension urgency/uncontrolled hypertension -Per patient, she reported to the nurse that she took half of a clonidine tablet yesterday which helped her with her hypertension as well as headache.  Of note clonidine is not on her home medication list.  Suspect taking this medication inappropriately has caused her to have rebound hypertension as well as headache. -Blood pressure on admission 184/104, has improved currently 124/70 -Continue telmisartan and coreg  Headache -Improved -Continue pain control, see discussion above  Hypokalemia -Resolved with repletion  Hypothyroidism -Continue Synthroid  Depression/anxiety -Continue Lexapro  Procedures: Echocardiogram  Consultations: Cardiology  Discharge Exam: Vitals:   03/06/17 0031 03/06/17 0613  BP: 133/74 124/70  Pulse: 75 73  Resp: 18 18  Temp: 98.2 F (36.8 C) 97.9 F (36.6 C)  SpO2: 97% 97%   Denies current chest pain. Still complains of headache. Denies shortness of breath, abdominal pain, N/V/D/C.    General: Well developed, well nourished, NAD, appears stated age  6: NCAT, mucous membranes moist.  Cardiovascular: S1 S2 auscultated, RRR, no murmurs  Respiratory: Clear to auscultation bilaterally with equal chest rise  Abdomen: Soft, obese, nontender, nondistended, + bowel sounds  Extremities: warm dry without cyanosis clubbing or edema  Neuro: AAOx3, nonfocal  Psych: Normal affect and demeanor with intact judgement and insight   Discharge Instructions Discharge Instructions    Discharge instructions   Complete by:  As directed    Patient will be discharged to home.  Patient will need to follow up with primary care provider within one week of discharge.  Follow up with Dr. Irish Lack, cardiology. Patient should continue medications as prescribed.  Patient should follow a heart healthy diet.     Allergies as of 03/06/2017      Reactions  Theophyllines  Palpitations      Medication List    STOP taking these medications   meloxicam 15 MG tablet Commonly known as:  MOBIC   TRULICITY 1.61 WR/6.0AV Sopn Generic drug:  Dulaglutide     TAKE these medications   albuterol 108 (90 Base) MCG/ACT inhaler Commonly known as:  PROVENTIL HFA;VENTOLIN HFA Inhale 2 puffs into the lungs every 6 (six) hours as needed for wheezing or shortness of breath.   albuterol (2.5 MG/3ML) 0.083% nebulizer solution Commonly known as:  PROVENTIL Take 3 mLs (2.5 mg total) by nebulization every 6 (six) hours as needed for wheezing or shortness of breath.   aspirin 81 MG EC tablet Take 1 tablet (81 mg total) by mouth daily. Start taking on:  03/07/2017   calcium-vitamin D 500-200 MG-UNIT tablet Take 1 tablet by mouth 2 (two) times daily with a meal. Reported on 02/26/2015   carvedilol 3.125 MG tablet Commonly known as:  COREG Take 1 tablet (3.125 mg total) by mouth 2 (two) times daily with a meal.   conjugated estrogens vaginal cream Commonly known as:  PREMARIN Place 1 Applicatorful vaginally daily. What changed:    when to take this  reasons to take this   cyclobenzaprine 10 MG tablet Commonly known as:  FLEXERIL Take 1 tablet (10 mg total) by mouth 3 (three) times daily as needed for muscle spasms.   diphenhydrAMINE 25 MG tablet Commonly known as:  BENADRYL Take 25-50 mg by mouth 2 (two) times daily as needed for allergies or sleep.   escitalopram 20 MG tablet Commonly known as:  LEXAPRO Take 1 tablet (20 mg total) by mouth daily.   levothyroxine 25 MCG tablet Commonly known as:  SYNTHROID, LEVOTHROID Take one tablet daily. What changed:    how much to take  how to take this  when to take this  additional instructions   metFORMIN 1000 MG tablet Commonly known as:  GLUCOPHAGE Take 1,000 mg by mouth 2 (two) times daily with a meal. What changed:  Another medication with the same name was removed. Continue taking this medication,  and follow the directions you see here.   multivitamin tablet Take 1 tablet by mouth daily. Reported on 02/26/2015   pantoprazole 40 MG tablet Commonly known as:  PROTONIX Take 1 tablet (40 mg total) by mouth daily.   SLOW FE PO Take 1 tablet by mouth 3 (three) times a week.   telmisartan 40 MG tablet Commonly known as:  MICARDIS TAKE 1 TABLET BY MOUTH EACH MORNING. What changed:    how much to take  how to take this  when to take this  additional instructions   traMADol 50 MG tablet Commonly known as:  ULTRAM Take 1 tablet (50 mg total) by mouth every 8 (eight) hours as needed. What changed:  reasons to take this   VICTOZA 18 MG/3ML Sopn Generic drug:  liraglutide INJECT 1.8 MG DAILY What changed:  See the new instructions.      Allergies  Allergen Reactions  . Theophyllines Palpitations   Follow-up Information    Baxley, Cresenciano Lick, MD. Schedule an appointment as soon as possible for a visit in 1 week(s).   Specialty:  Internal Medicine Why:  Hospital follow up Contact information: [address] [place] [address] [phone]        Jettie Booze, MD. Call in 1 week(s).   Specialties:  Cardiology, Radiology, Interventional Cardiology Why:  Outpatient stress test Contact information: [address]  [address] [place] [address] [phone]            The results of significant diagnostics from this hospitalization (including imaging, microbiology, ancillary and laboratory) are listed below for reference.    Significant Diagnostic Studies: Dg Chest 2 View  Result Date: 03/04/2017 CLINICAL DATA:  Left chest pain EXAM: CHEST  2 VIEW COMPARISON:  October 19, 2015 FINDINGS: The heart size and mediastinal contours are within normal limits. There is no focal infiltrate, pulmonary edema, or pleural effusion. The visualized skeletal structures are unremarkable. IMPRESSION: No active cardiopulmonary disease. Electronically  Signed   By: Abelardo Diesel M.D.   On: 03/04/2017 18:29    Microbiology: No results found for this or any previous visit (from the past 240 hour(s)).   Labs: Basic Metabolic Panel: Recent Labs  Lab 03/04/17 1614 03/05/17 0312 03/06/17 0451  NA 131* 137 138  K 4.0 3.1* 4.1  CL 99* 105 106  CO2 20* 20* 21*  GLUCOSE 515* 160* 143*  BUN 11 8 10   CREATININE 0.88 0.81 0.87  CALCIUM 8.9 8.7* 8.7*  MG  --   --  2.0   Liver Function Tests: No results for input(s): AST, ALT, ALKPHOS, BILITOT, PROT, ALBUMIN in the last 168 hours. No results for input(s): LIPASE, AMYLASE in the last 168 hours. No results for input(s): AMMONIA in the last 168 hours. CBC: Recent Labs  Lab 03/04/17 1614 03/05/17 0350 03/06/17 0451  WBC 7.0 5.7 7.5  HGB 13.1 12.5 12.4  HCT 39.1 37.1 37.7  MCV 80.1 79.8 81.4  PLT 338 271 316   Cardiac Enzymes: Recent Labs  Lab 03/05/17 0312 03/05/17 0802 03/05/17 1552  TROPONINI <0.03 <0.03 <0.03   BNP: BNP (last 3 results) No results for input(s): BNP in the last 8760 hours.  ProBNP (last 3 results) No results for input(s): PROBNP in the last 8760 hours.  CBG: Recent Labs  Lab 03/05/17 1550 03/05/17 1955 03/06/17 0030 03/06/17 0359 03/06/17 0740  GLUCAP 253* 263* 258* 139* 179*       Signed:  Chrishauna Mee  [facility] 03/06/2017, 10:02 AM

## 2017-03-06 NOTE — Progress Notes (Signed)
Patient given discharge instructions and all questions answered.  

## 2017-03-06 NOTE — Discharge Instructions (Signed)

## 2017-03-08 NOTE — Telephone Encounter (Signed)
See other message

## 2017-03-09 DIAGNOSIS — T1490XA Injury, unspecified, initial encounter: Secondary | ICD-10-CM | POA: Diagnosis not present

## 2017-03-09 DIAGNOSIS — I639 Cerebral infarction, unspecified: Secondary | ICD-10-CM | POA: Diagnosis not present

## 2017-03-09 DIAGNOSIS — I69354 Hemiplegia and hemiparesis following cerebral infarction affecting left non-dominant side: Secondary | ICD-10-CM | POA: Diagnosis not present

## 2017-03-09 DIAGNOSIS — Z7401 Bed confinement status: Secondary | ICD-10-CM | POA: Diagnosis not present

## 2017-03-09 DIAGNOSIS — R4182 Altered mental status, unspecified: Secondary | ICD-10-CM | POA: Diagnosis not present

## 2017-03-09 DIAGNOSIS — I959 Hypotension, unspecified: Secondary | ICD-10-CM | POA: Diagnosis not present

## 2017-03-09 DIAGNOSIS — I69090 Apraxia following nontraumatic subarachnoid hemorrhage: Secondary | ICD-10-CM | POA: Diagnosis not present

## 2017-03-11 ENCOUNTER — Ambulatory Visit (INDEPENDENT_AMBULATORY_CARE_PROVIDER_SITE_OTHER): Payer: 59 | Admitting: Internal Medicine

## 2017-03-11 ENCOUNTER — Encounter: Payer: Self-pay | Admitting: Internal Medicine

## 2017-03-11 ENCOUNTER — Other Ambulatory Visit: Payer: Self-pay

## 2017-03-11 VITALS — BP 120/80 | HR 80 | Temp 98.1°F | Ht 63.0 in | Wt 195.0 lb

## 2017-03-11 DIAGNOSIS — F3289 Other specified depressive episodes: Secondary | ICD-10-CM

## 2017-03-11 DIAGNOSIS — Z62810 Personal history of physical and sexual abuse in childhood: Secondary | ICD-10-CM | POA: Diagnosis not present

## 2017-03-11 DIAGNOSIS — R079 Chest pain, unspecified: Secondary | ICD-10-CM | POA: Diagnosis not present

## 2017-03-11 DIAGNOSIS — Z8659 Personal history of other mental and behavioral disorders: Secondary | ICD-10-CM | POA: Diagnosis not present

## 2017-03-11 DIAGNOSIS — F411 Generalized anxiety disorder: Secondary | ICD-10-CM | POA: Diagnosis not present

## 2017-03-11 MED ORDER — CLONAZEPAM 0.5 MG PO TABS: 0.5000 mg | ORAL_TABLET | Freq: Two times a day (BID) | ORAL | 1 refills | Status: DC | PRN

## 2017-03-11 MED ORDER — ONDANSETRON HCL 4 MG PO TABS: 4.0000 mg | ORAL_TABLET | Freq: Three times a day (TID) | ORAL | 0 refills | Status: AC | PRN

## 2017-03-11 MED FILL — ONDANSETRON HCL 4 MG TABLET: 4 | 7 days supply | Qty: 20 | Fill #0

## 2017-03-11 MED FILL — clonazePAM 0.5 MG TABS: 0.5 | 15 days supply | Qty: 30 | Fill #0
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
## 2017-03-12 ENCOUNTER — Ambulatory Visit (INDEPENDENT_AMBULATORY_CARE_PROVIDER_SITE_OTHER): Payer: 59 | Admitting: Internal Medicine

## 2017-03-12 DIAGNOSIS — R079 Chest pain, unspecified: Secondary | ICD-10-CM

## 2017-03-15 ENCOUNTER — Encounter: Payer: Self-pay | Admitting: Internal Medicine

## 2017-03-15 DIAGNOSIS — F39 Unspecified mood [affective] disorder: Secondary | ICD-10-CM | POA: Diagnosis not present

## 2017-03-15 NOTE — Patient Instructions (Addendum)
Out of work until stress test can be performed.  Myocardial perfusion study scheduled for February 22.  Continue counseling.  Follow-up with Dr. Tobe Sos in March.

## 2017-03-16 ENCOUNTER — Telehealth (HOSPITAL_COMMUNITY): Payer: Self-pay | Admitting: *Deleted

## 2017-03-16 NOTE — Telephone Encounter (Signed)
Left message on voicemail per DPR in reference to upcoming appointment scheduled on 03/19/17 at 0930 with detailed instructions given per Myocardial Perfusion Study Information Sheet for the test. LM to arrive 15 minutes early, and that it is imperative to arrive on time for appointment to keep from having the test rescheduled. If you need to cancel or reschedule your appointment, please call the office within 24 hours of your appointment. Failure to do so may result in a cancellation of your appointment, and a $50 no show fee. Phone number given for call back for any questions.

## 2017-03-19 ENCOUNTER — Ambulatory Visit (HOSPITAL_COMMUNITY): Payer: 59 | Attending: Cardiovascular Disease

## 2017-03-19 DIAGNOSIS — R079 Chest pain, unspecified: Secondary | ICD-10-CM | POA: Insufficient documentation

## 2017-03-19 MED ORDER — TECHNETIUM TC 99M TETROFOSMIN IV KIT
32.5000 | PACK | Freq: Once | INTRAVENOUS | Status: AC | PRN
  Administered 2017-03-19: 32.5 via INTRAVENOUS
  Filled 2017-03-19: qty 33

## 2017-03-21 NOTE — Patient Instructions (Signed)
Visit cancelled.

## 2017-03-21 NOTE — Progress Notes (Signed)
Visit cancelled.

## 2017-03-22 ENCOUNTER — Ambulatory Visit (HOSPITAL_COMMUNITY): Payer: 59 | Attending: Cardiology

## 2017-03-22 LAB — MYOCARDIAL PERFUSION IMAGING
CHL CUP RESTING HR STRESS: 90 {beats}/min
CSEPED: 4 min
CSEPEW: 5.8 METS
CSEPHR: 91 %
Exercise duration (sec): 0 s
LV sys vol: 18 mL
LVDIAVOL: 59 mL (ref 46–106)
MPHR: 165 {beats}/min
Peak HR: 151 {beats}/min
RATE: 0.31
SDS: 2
SRS: 1
SSS: 3
TID: 0.89

## 2017-03-22 MED ORDER — TECHNETIUM TC 99M TETROFOSMIN IV KIT
31.7000 | PACK | Freq: Once | INTRAVENOUS | Status: AC | PRN
  Administered 2017-03-22: 31.7 via INTRAVENOUS
  Filled 2017-03-22: qty 32

## 2017-03-30 ENCOUNTER — Ambulatory Visit (INDEPENDENT_AMBULATORY_CARE_PROVIDER_SITE_OTHER): Payer: 59 | Admitting: "Endocrinology

## 2017-04-01 MED FILL — clonazePAM 0.5 MG TABS: 0.5 | 15 days supply | Qty: 30 | Fill #1

## 2017-04-01 MED FILL — CARVEDILOL 3.125 MG TABLET: 3.125 | 30 days supply | Qty: 60 | Fill #0

## 2017-04-12 ENCOUNTER — Encounter (INDEPENDENT_AMBULATORY_CARE_PROVIDER_SITE_OTHER): Payer: Self-pay | Admitting: "Endocrinology

## 2017-04-12 ENCOUNTER — Ambulatory Visit (INDEPENDENT_AMBULATORY_CARE_PROVIDER_SITE_OTHER): Payer: 59 | Admitting: "Endocrinology

## 2017-04-12 VITALS — BP 130/72 | HR 88 | Wt 192.6 lb

## 2017-04-12 DIAGNOSIS — E559 Vitamin D deficiency, unspecified: Secondary | ICD-10-CM | POA: Diagnosis not present

## 2017-04-12 DIAGNOSIS — I10 Essential (primary) hypertension: Secondary | ICD-10-CM

## 2017-04-12 DIAGNOSIS — R000 Tachycardia, unspecified: Secondary | ICD-10-CM | POA: Diagnosis not present

## 2017-04-12 DIAGNOSIS — IMO0001 Reserved for inherently not codable concepts without codable children: Secondary | ICD-10-CM

## 2017-04-12 DIAGNOSIS — E1143 Type 2 diabetes mellitus with diabetic autonomic (poly)neuropathy: Secondary | ICD-10-CM | POA: Diagnosis not present

## 2017-04-12 DIAGNOSIS — R4584 Anhedonia: Secondary | ICD-10-CM

## 2017-04-12 DIAGNOSIS — K219 Gastro-esophageal reflux disease without esophagitis: Secondary | ICD-10-CM | POA: Diagnosis not present

## 2017-04-12 DIAGNOSIS — E063 Autoimmune thyroiditis: Secondary | ICD-10-CM

## 2017-04-12 DIAGNOSIS — F39 Unspecified mood [affective] disorder: Secondary | ICD-10-CM | POA: Diagnosis not present

## 2017-04-12 DIAGNOSIS — E1165 Type 2 diabetes mellitus with hyperglycemia: Secondary | ICD-10-CM

## 2017-04-12 DIAGNOSIS — E11649 Type 2 diabetes mellitus with hypoglycemia without coma: Secondary | ICD-10-CM | POA: Diagnosis not present

## 2017-04-12 LAB — POCT GLUCOSE (DEVICE FOR HOME USE): POC Glucose: 174 mg/dl — AB (ref 70–99)

## 2017-04-12 LAB — POCT GLYCOSYLATED HEMOGLOBIN (HGB A1C): HEMOGLOBIN A1C: 10

## 2017-04-12 MED ORDER — NALTREXONE-BUPROPION HCL ER 8-90 MG PO TB12: ORAL_TABLET | ORAL | 5 refills | Status: DC

## 2017-04-12 MED ORDER — CARVEDILOL 3.125 MG PO TABS: 3.1250 mg | ORAL_TABLET | Freq: Two times a day (BID) | ORAL | 0 refills | Status: DC

## 2017-04-12 MED ORDER — COREG 3.125 MG PO TABS: 3.1250 mg | ORAL_TABLET | Freq: Two times a day (BID) | ORAL | 11 refills | Status: DC

## 2017-04-12 NOTE — Progress Notes (Signed)
Subjective:  Patient Name: Alexa [name] Date of Birth: 1960/12/22  MRN: [id number]  Alexa [name]  presents to the office today for follow-up of her type 2 diabetes mellitus, obesity, combined hyperlipidemia, GERD, hypertension, ASHD, dyspepsia, pedal edema, non-proliferative diabetic retinopathy, goiter, depression, autonomic neuropathy, tachycardia, peripheral neuropathy, acquired hypothyroidism, vitamin D deficiency, hypocalcemia, secondary hyperparathyroidism, glossitis, pallor, fatigue, iron deficiency anemia, anhedonia, disinclination to take medicines, and status post Roux-en-Y gastric bypass.   HISTORY OF PRESENT ILLNESS:   Alexa [name] is a 57 y.o. Caucasian woman. Alexa [name] was unaccompanied.  1. The patient first presented to me on 08/20/04 in referral from her primary care internist, Dr. Emeline General, for evaluation and management of type 2 diabetes, obesity, and multiple medical issues. She was 57 years old.   [name] had a long history of obesity. She was heavy as a child. She underwent menarche at age 35. At age 45 she was 180 pounds. In 1996 she weighed 218 pounds. In 1999 she was diagnosed with type 2 diabetes mellitus. Her weight at that point was 250 pounds. She was treated with Glucophage, and Actos. Actos made her gain even more weight. She had also been treated with glipizide in the past. More recently she had been treated with Lantus and Glucophage plus regular insulin as needed, especially for when she took steroids for asthma attacks. Maximum weight had been 296 pounds one month prior to that first visit with me. Her tendency to gain weight and her difficulty in losing weight were aggravated by long-standing, intermittent depression and by severe, recurrent asthma requiring the use of steroid medications.  B. Past medical history was also positive for a 60% blockage in one of her coronary arteries. She had significant issues with GERD and dyspepsia. She also had combined  hyperlipidemia. She had a previous cardiac catheterization and previous tonsillectomy. She was allergic to theophylline. Her pertinent review of systems was positive for some numbness and tingling in her feet. Family history was positive for type 2 diabetes in her father and her maternal grandmother. Her brother weighed 250 pounds at a height of 76 inches. Both her mother and her sister were hypothyroid.  C. On physical examination, her weight was 279.9 pounds. Her BMI was 49.6. Her blood pressure was 142/86. Her heart rate was 96. Her hemoglobin A1c was 9.8%. She was alert and oriented x3. Her affect was normal. Her insight was fair. She was obviously quite obese. She had a 25 gm thyroid gland. She had 1+ tremor of her hands. She had 1+ DP pulses and 2+ tinea pedis. Sensation to touch was intact in her feet. Laboratory data included a normal CMP. Her cholesterol was 175, triglycerides 76, HDL 53, and LDL 107. Her TSH was 2.98. Since she obviously did have type 2 diabetes mellitus associated with morbid obesity, and since weight loss was a major factor for her, I asked her to resume her metformin twice daily. I also started her on Byetta, initially 5 mcg twice daily and later 10 mcg twice daily. I discontinued her Lantus insulin.  2. During the last 13 years,we have had some successes, some failures, and some new problem areas.  A. T2DM: In October 2007 the combination of Byetta and metformin was causing more gastrointestinal problems. Patient opted to stop the metformin and continue the Byetta because it was helping her with weight control and blood sugar control. By 08/11/06 her weight had decreased to 267.6 pounds. Hemoglobin A1c was 8.6%. At that point she  decided to have bariatric surgery. She had a Roux-en-Y gastric bypass on 12/20/2006. She subsequently lost weight down to 173.4 pounds on 05/23/08, but then subsequently regained weight to the 190s. Her  hemoglobin A1c values varied in parallel with her  weights. On 05/23/08, at the point of her lowest weight, her hemoglobin A1c dropped to a nadir of 6.3%.  Since then her hemoglobin A1c values have varied between 6.6 and 13.4%. Although we were initially able to stop all of her diabetes medicines after bariatric surgery, when she began to regain weight we restarted metformin, 500 mg twice daily. Although she took her metformin twice daily for a long time, she had discontinued it many years ago. She hated to take medicines and could not make herself be compliant in taking medications or exercising. When her HbA1c increased to 9.4% in September 2015, I stopped her Byetta injections and started her on liraglutide (Victoza) daily injections on 11/16/13.  B. Vitamin D deficiency, hypocalcemia, and secondary hyperparathyroidism: Just before her gastric bypass, I obtained baseline bone mineral metabolism studies. Her 25-hydroxy vitamin D was very low at 7 (normal greater than or equal to 30). Her calcium was 9.5 (normal 8.6-10.6). Her parathyroid hormone was 38.4 (normal 14-72). Subsequent to her surgery, the patient was supposed to be taking multivitamins with calcium and vitamin D, but did not always do so. On 03/29/2007 her 25-hydroxy vitamin D was 29, but her calcium decreased to 8.7. Her PTH was slightly elevated at 73.5. Her 1,25-hydroxy vitamin D was 46. Her iron was 56. I asked her to make sure she took her multivitamins and calcium daily. Unfortunately, she has often been non-compliant with her multivitamins and calcium. Her 25-hydroxy vitamin D values have varied between 15-35. Her calcium values have varied between 8.1-9.4. Her PTH values have remained elevated between 79-167. The patient's iron levels have also been low, between 32-34. She is supposed to be taking iron every day as well. Unfortunately, in August of 2014 she discontinued all vitamins and supplements.   C. The patient has had psychological issues for many years that have adversely affected her  eating, her unwillingness to exercise, her noncompliance with taking medications, her weight, and her T2DM care. Fortunately, her recent outpatient psychological therapy with Ms. Rea College, RN, MS has often resulted in Alexa [name] having a much more positive attitude about life in general and a generally more positive attitude about taking her medications. Unfortunately, Alexa [name]'s noncompliance problems often tend to recur.  3. The patient's last [facility] visit was on 01/11/17.   A. In the interim she was admitted on 03/14/17 with chest pain and severe hypertension due to not taking her medications. Her cardiac enzymes were normal and her Gated Spect Myocardial perfusion study was also essentially normal. She is now taking all of her medications. She has trouble swallowing there MVI, so I suggested gummis or kids vitamins.   B. She has been healthy. She has been eating less since being on the Contrave.   Joylene Igo remains in counseling with Ms. Showfety, most recently earlier today.    D. She has not had much of a problem with asthma.    E. She has been sleeping better, except for her husband's snoring. She still has some hot flashes occasionally, but the night sweats have stopped. She has fewer leg cramps during the night.   F. She stopped drinking Coke. She has not been exercising.   G. She is taking Lexapro, 20 mg/day; Synthroid, 25 mcg/day; Micardis, 40  mg/day; Victoza,1.8 mg/day; Protonix, 40 mg/day; metformin 1000 mg, twice daily. She takes two Citracal tablets once daily at about dinner time. She resumed talking Contrave, two pills in the morning and two pills at night. She is also taking Coreg, 3.125 mg/day.  H. She is making progress at not allowing herself to become over-committed. She is trying to achieve more balance.   4. Review of Systems: There are no other significant issues  Constitutional: The patient feels "good".   Eyes: Vision has been blurry at times. She will have an eye exam in June.  The last eye exam that I am aware of occurred in the Spring of 2014. Neck: She has not had any recent trouble swallowing at the level of her mid-neck. The patient has no complaints of anterior neck swelling, soreness, tenderness, pressure, or discomfort.  Heart: She has not had any further chest pain or pressure. Heart rate increases with exercise or other physical activity.  The patient has no complaints of palpitations or irregular heat beats Gastrointestinal: The patient's BMs have been pretty regular. She has not had any post-prandial bloating recently. She does not have any excessive head hunger or belly hunger, acid reflux, upset stomach, stomach aches or pains, swallowing difficulties, or diarrhea. Legs: She no longer has occasional nocturnal calf cramps. Muscle mass and strength seem normal. There are no new complaints of numbness, tingling, or burning. No edema is noted. Feet: She has been having some nocturnal foot cramps, but much less frequently and not at all severe. There are no new complaints of numbness, tingling, or burning. No edema is noted. Tinea improves when she uses her anti-fungal cream.  Neuro: No new sensory or muscular problems Psych: She is doing much better.    GU: She has been urinating a lot. She occasionally has nocturia.  Hypoglycemia: She has been having more low BG symptoms.   5. BG printout: She did not bring in her BG meter today. She has not been checking her BGs. I discussed the use of the [facility] [place] with her.   PAST MEDICAL, FAMILY, AND SOCIAL HISTORY:  Past Medical History:  Diagnosis Date  . Anemia, iron deficiency   . Asthma   . Asthma, chronic   . CAD (coronary artery disease)   . Chronic insomnia   . Combined hyperlipidemia   . Depression   . Diabetes mellitus   . Diabetes mellitus type II   . Diabetic autonomic neuropathy ([place])   . DM type 2 with diabetic peripheral neuropathy ([place])   . Dyspepsia   . Elevated homocysteine ([place])   .  Essential tremor   . Fatigue   . GERD (gastroesophageal reflux disease)   . GERD (gastroesophageal reflux disease)   . Glossitis   . Goiter   . Hyperparathyroidism , secondary, non-renal ([place])   . Hypertension   . Nonproliferative diabetic retinopathy associated with type 2 diabetes mellitus ([place])    RESOLVED  . Obesity, Class III, BMI 40-49.9 (morbid obesity) ([place])   . Pallor   . Tachycardia   . Thyroiditis, autoimmune   . Vitamin D deficiency     Family History  Problem Relation Age of Onset  . Thyroid disease Mother        Hypothyroid  . Diabetes Father        T2 DM  . Obesity Brother        250 pounds at a height of 76 inches.  . Thyroid disease Sister  Hypothyroid  . Diabetes Maternal Grandmother   . Colon cancer Other        great grandfather  . Liver disease Maternal Aunt        NASH  . Esophageal cancer Neg Hx   . Pancreatic cancer Neg Hx   . Kidney disease Neg Hx      Current Outpatient Medications:  .  albuterol (PROVENTIL HFA;VENTOLIN HFA) 108 (90 Base) MCG/ACT inhaler, Inhale 2 puffs into the lungs every 6 (six) hours as needed for wheezing or shortness of breath., Disp: 1 Inhaler, Rfl: 11 .  albuterol (PROVENTIL) (2.5 MG/3ML) 0.083% nebulizer solution, Take 3 mLs (2.5 mg total) by nebulization every 6 (six) hours as needed for wheezing or shortness of breath., Disp: 150 mL, Rfl: 1 .  Calcium Carbonate-Vitamin D (CALCIUM-VITAMIN D) 500-200 MG-UNIT per tablet, Take 1 tablet by mouth 2 (two) times daily with a meal. Reported on 02/26/2015, Disp: , Rfl:  .  carvedilol (COREG) 3.125 MG tablet, Take 1 tablet (3.125 mg total) by mouth 2 (two) times daily with a meal., Disp: 60 tablet, Rfl: 0 .  clonazePAM (KLONOPIN) 0.5 MG tablet, Take 1 tablet (0.5 mg total) by mouth 2 (two) times daily as needed for anxiety., Disp: 30 tablet, Rfl: 1 .  conjugated estrogens (PREMARIN) vaginal cream, Place 1 Applicatorful vaginally daily. (Patient taking differently: Place 1  Applicatorful vaginally daily as needed (for dryness). ), Disp: 42.5 g, Rfl: 11 .  cyclobenzaprine (FLEXERIL) 10 MG tablet, Take 1 tablet (10 mg total) by mouth 3 (three) times daily as needed for muscle spasms., Disp: 90 tablet, Rfl: 0 .  diphenhydrAMINE (BENADRYL) 25 MG tablet, Take 25-50 mg by mouth 2 (two) times daily as needed for allergies or sleep., Disp: , Rfl:  .  escitalopram (LEXAPRO) 20 MG tablet, Take 1 tablet (20 mg total) by mouth daily., Disp: 90 tablet, Rfl: 1 .  Ferrous Sulfate (SLOW FE PO), Take 1 tablet by mouth 3 (three) times a week., Disp: , Rfl:  .  levothyroxine (SYNTHROID, LEVOTHROID) 25 MCG tablet, Take one tablet daily. (Patient taking differently: Take 25 mcg by mouth daily before breakfast. ), Disp: 90 tablet, Rfl: 3 .  metFORMIN (GLUCOPHAGE) 1000 MG tablet, Take 1,000 mg by mouth 2 (two) times daily with a meal., Disp: , Rfl:  .  Multiple Vitamin (MULTIVITAMIN) tablet, Take 1 tablet by mouth daily. Reported on 02/26/2015, Disp: 30 tablet, Rfl: 0 .  Naltrexone-buPROPion HCl ER (CONTRAVE) 8-90 MG TB12, Take 2 tablets by mouth 2 (two) times daily., Disp: , Rfl:  .  ondansetron (ZOFRAN) 4 MG tablet, Take 1 tablet (4 mg total) by mouth every 8 (eight) hours as needed for nausea or vomiting., Disp: 20 tablet, Rfl: 0 .  pantoprazole (PROTONIX) 40 MG tablet, Take 1 tablet (40 mg total) by mouth daily., Disp: 90 tablet, Rfl: 4 .  telmisartan (MICARDIS) 40 MG tablet, TAKE 1 TABLET BY MOUTH EACH MORNING. (Patient taking differently: Take 40 mg by mouth daily. ), Disp: 90 tablet, Rfl: 3 .  traMADol (ULTRAM) 50 MG tablet, Take 1 tablet (50 mg total) by mouth every 8 (eight) hours as needed. (Patient taking differently: Take 50 mg by mouth every 8 (eight) hours as needed (for pain). ), Disp: 90 tablet, Rfl: 1 .  VICTOZA 18 MG/3ML SOPN, INJECT 1.8 MG DAILY (Patient taking differently: Inject 1.8 mg into the skin once a day), Disp: 27 mL, Rfl: PRN .  aspirin EC 81 MG EC tablet, Take 1  tablet (81 mg total) by mouth daily. (Patient not taking: Reported on 04/12/2017), Disp: 30 tablet, Rfl: 0  Allergies as of 04/12/2017 - Review Complete 04/12/2017  Allergen Reaction Noted  . Theophyllines Palpitations 05/13/2010    1. 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 2. Activities: She has not done much physical activity.  3. Smoking, alcohol, or drugs: None 4. Primary Care Provider: Dr. Tommie Ard Baxley 5. Therapist: Ms. Rea College, MS  REVIEW OF SYSTEM: There are no other significant problems involving Alexa [name]'s other body systems.   Objective:  Vital Signs:  BP 130/72   Pulse 88   Wt 192 lb 9.6 oz (87.4 kg)   LMP 05/11/2010   BMI 34.12 kg/m    Ht Readings from Last 3 Encounters:  03/11/17 5\' 3"  (1.6 m)  03/05/17 5\' 3"  (1.6 m)  01/11/17 5\' 3"  (1.6 m)   Wt Readings from Last 3 Encounters:  04/12/17 192 lb 9.6 oz (87.4 kg)  03/11/17 195 lb (88.5 kg)  03/06/17 195 lb 4.8 oz (88.6 kg)   PHYSICAL EXAM:  Constitutional:  The patient looks wonderful today. She is alert and bright. Her affect and insight are very good. She is wearing a blouse and slacks today and is also wearing lipstick today. She has lost 9 pounds in 3 months, equivalent to a net caloric loss of 330 calories per day.   Eyes: There is no arcus or proptosis.  Mouth: The oropharynx appears normal. The tongue appears normal. There is normal oral moisture. There is no obvious gingivitis. Neck: There are no bruits present. The thyroid gland appears normal in size. The thyroid gland is again within normal limits at 20 grams in size. The consistency of the thyroid gland is normal. There is no thyroid tenderness to palpation. Lungs: The lungs are clear. Air movement is good. Heart: The heart rhythm and rate appear normal. Heart sounds S1 and S2 are normal. I do not appreciate any  pathologic heart murmurs. Abdomen: The abdomen is enlarged. Bowel sounds are normal. The abdomen is soft and non-tender. There is no obviously palpable hepatomegaly, splenomegaly, or other masses.  Arms: Muscle mass appears appropriate for age.  Hands: She has a 2+ tremor. Phalangeal and metacarpophalangeal joints appear normal. Palms are normal. Legs: Muscle mass appears appropriate for age. There is no edema.  Feet: There are no significant deformities. Dorsalis pedis pulses are normal 2+ bilaterally. She has 1+ tinea in her great toenails.  Neurologic: Muscle strength is normal for age and gender  in both the upper and the lower extremities. Muscle tone appears normal. Sensation to touch is normal in the legs and feet.     LAB DATA:  Labs 04/12/17: HbA1c 10.0%, CBG 174  Labs 03/04/17: Sodium 131, potassium 4.0, chloride 99, CO2 20, glucose 515, calcium 8.9  Labs 01/11/17: CBG 198  Labs 11/24/16: CBG 468; urine glucose 2000+, negative ketones  Labs 11/17/16: HbA1c 13.4%; TSH 1.54; cholesterol 173, triglycerides 91, HDL 71, LDL 83; 25-OH vitamin D 21; CMP normal except for glucose 314; CBC normal except MCHC 26.5 (ref 27-33)   06/04/16: HBA1c 8.1%, CBG 115  05/27/16: TSH 2.62, free T4 1.1, free T3 2.3; CMP normal except for glucose 161, and creatinine 1.09; cholesterol 187, triglycerides 77, HDL 74, LDL 98; urinary microalbumin/creatinine ratio 6; PTH 112, calcium 9.2, 25-OH vitamin D 33  03/17/16: CBG 145  Labs 12/31/15: HbA1c 13.4%, CBG 390; CMP normal except for glucose 344 and creatinine 1.14  Labs 10/08/15: HbA1c 9.6%  Labs 07/02/15: HbA1c 8.9%; PTH 107, calcium 9.2, 25-OH vitamin D 33; CBC normal; CMP normal except for creatinine 1.07; cholesterol 174, triglycerides 91, HDL 74, LDL 82; TSH 2.43, free T4 1.2, free T3 2.3  Labs 02/19/15: PTH 99, calcium 8.7, 25-OH vitamin D 20  Labs 10/08/14: HbA1c 8.0%   Labs 08/06/14: HbA1c 7.4%  Labs 08/01/14: TSH 2.014, free T4 0.87, free T3 2.4;  PTH 79 (normal 14-64), calcium 9.1; 25-OH vitamin D 35 CMP normal  Labs 05/11/14: HbA1c was 7.7%, without any low BGs; TSH 2.416, free T4 1.13, free T3 2.3  Labs 02/22/14: HbA1c 11.6%  Labs 02/13/14: Calcium 9.0, PTH 131, 25-OH vitamin D 15; CMP normal except for glucose of 165; cholesterol 177, triglycerides 108, HDL 90, LDL 65; urinary microalbumin/creatinine ratio 15.6; TSH 3.639, free T4 1.30, free T3 2.5  Labs 10/10/13: HbA1c is 9.4%, compared with 8.1% at last visit and with 9.3% in December;   Labs 05/08/13: HbA1c 8.1%; PTH 90.2, calcium 9.5, 25-hydroxy vitamin D 46; CMP normal except for glucose of 215; WBC 8.0, RBC 5.20, Hgb 12.5, Hct 37.8%, MCV 72.7 (normal 78-100), iron 55  Labs 02/23/13: CMP normal, except glucose 194 and alkaline phosphatase 126; microalbumin/creatinine ratio was 11.5; TSH 2.086, free T4 1.29, free T3 3.2, TPO antibody < 10; 25-hydroxy vitamin D 18; C-peptide 1.79; cholesterol 156, triglycerides 64, HDL 61, LDL 82  Labs 01/05/13: Hemoglobin A1c was 9.3%, compared with 9.3 at last visit and with 9.1% at the visit prior. All of these values were major increases from 6.6% in May 2012.    Labs 04/14/12: 25-hydroxy vitamin D 21, iron 203,  Vitamin B12 379,   Labs 11/16/11: Hgb 10.3, Hct 32.4%; CMP normal except glucose 146' cholesterol 201, triglycerides 70, HDL 66, LDL 121; vitamin D 22           Labs 06/09/10: Cholesterol was 175, triglycerides 76, HDL 62, LDL 98. TSH was 1.217. Free T4 was 1.10. Free T3 was 2.7. 25-hydroxy vitamin D was 19. PTH was 167.   Assessment and Plan:   ASSESSMENT:  1. Type 2 diabetes mellitus:   A. Her T2DM was out of control at her December 2017 visit due to her not taking medications reliably and to not exercising. Her HbA1c of 13.4% was probably the highest that it had ever been up to that time..    B. At her visit in May 2018 her HbA1c has decreased to 8.0%. Her BG control improved dramatically when she took her medications and watched her  carb intake. Contrave was helping.   C. Unfortunately, she later relapsed in terms of not taking her medications and other maladaptive health behaviors. Her HbA1c increased back to 13.4% as of 11/17/16.   D. After becoming very frightened by her chest pain and admission last month, she has resumed taking her medications and is doing much better in terms of her DM. Her HbA1c today is still  too high, but much lower that it was 5 months ago. Since resuming her DM and psych medications her BGs and her mood have improved.  2. Hypoglycemia: She has had some hypoglycemia symptoms recently. I recommended that she carry at least 40 grams of glucose with her at all times. I also recommended that she make notes whenever she has symptoms to see if we can discern a pattern.  3. Obesity: Her weight has decreased 9 pounds since her last visit. The Contrave, Victoza, and her other medications are helping her. She needs to continue to take her medications, continue to Eat Right and exercise daily.    4-5. Autonomic neuropathy and tachycardia: The patient's autonomic neuropathy and  heart rate improved as her  BGs decreased.    6. Hypertension: Her BP is better since resuming her medications. She needs to try to fit in exercise.  7-8. Anhedonia and anxiety: She is doing much better now, partly due to taking her meds and partly due to working out some of the family problems. She needs to call for help when she begins to do poorly. She also needs to continue to find ways to have more joy in her life. 9-12. GERD/esophagitis/difficulty swallowing/gastroparesis: Her reflux, other GI symptoms, and difficulty swallowing have all improved.   13-15: Hypocalcemia, vitamin D deficiency, and secondary hyperparathyroidism:   A. Due to her bariatric surgery she has more difficulty absorbing vitamin D and calcium. When she develops hypocalcemia and vitamin D deficiency, her parathyroid glands appropriated produce more PTH, resulting in  secondary hyperparathyroidism. However, if she takes her calcium and vitamin D all three parameters will normalize.   B. Her calcium was normal in October 2018, but at the lower limit of normal in February 2019. Her vitamin D level was normal in May 2018, but low in October 2018. She needs to take her vitamin D and calcium. Unfortunately, she missed too many doses of calcium, resulting in higher PTH levels. Adding two Tums at bedtime would help.  74. Noncompliance: Since last visit her compliance with eating right and taking her medications has greatly improved.    17: Muscle cramps: Her cramps occur much less frequently. Her potassium and calcium were normal in May and October 2018. She may still be more dehydrated at times.    18. Hypothyroidism: Her TSH in October 2018 was normal.   PLAN:  1. Diagnostic: None at this time. Consider ordering a Freestyle Libre for her. 2. Therapeutic: Continue medications as currently prescribed. [employer], 50,000 IU per week to improve her vitamin D level. Try to fit in exercise daily.  See Ms. Showfety in follow up.   3. Patient education: We discussed all of the above at great length. She must continue to take her medications and supplements. She also needs to see me every two months. When she knows that she is coming to see  "The Marveen Reeks", her compliance improves.  4. Follow-up: 2 months   Level of Service: This visit lasted in excess of 55 minutes. More than 50% of the visit was devoted to counseling.   Tillman Sers, MD, CDE Adult and Pediatric Endocrinology 04/12/2017 2:28 PM

## 2017-04-12 NOTE — Patient Instructions (Signed)
Follow up visit in 2 months. Please do lab tests 1-2 weeks prior.

## 2017-04-13 ENCOUNTER — Telehealth (INDEPENDENT_AMBULATORY_CARE_PROVIDER_SITE_OTHER): Payer: Self-pay | Admitting: *Deleted

## 2017-04-13 NOTE — Telephone Encounter (Signed)
LVM to advise to call me back regarding the Libre CGM. [facility] insurance does not cover them, but I have a coupon you can use.

## 2017-04-21 MED FILL — AZITHROMYCIN 250 MG TABLET: 250 | 5 days supply | Qty: 6 | Fill #0

## 2017-04-29 ENCOUNTER — Other Ambulatory Visit (INDEPENDENT_AMBULATORY_CARE_PROVIDER_SITE_OTHER): Payer: Self-pay | Admitting: "Endocrinology

## 2017-04-29 MED FILL — CARVEDILOL 3.125 MG TABLET: 3.125 | 30 days supply | Qty: 60 | Fill #0

## 2017-04-29 MED FILL — metFORMIN HCL 500 MG TABS: 500 | 90 days supply | Qty: 180 | Fill #0

## 2017-04-29 MED FILL — TELMISARTAN 40 MG TABLET: 40 | 90 days supply | Qty: 90 | Fill #1

## 2017-05-14 MED FILL — CONTRAVE ER 8-90 MG TABLET: 8-90 | 30 days supply | Qty: 120 | Fill #0

## 2017-06-07 ENCOUNTER — Other Ambulatory Visit (INDEPENDENT_AMBULATORY_CARE_PROVIDER_SITE_OTHER): Payer: 59 | Admitting: *Deleted

## 2017-06-08 ENCOUNTER — Other Ambulatory Visit: Payer: Self-pay | Admitting: "Endocrinology

## 2017-06-08 DIAGNOSIS — E1165 Type 2 diabetes mellitus with hyperglycemia: Secondary | ICD-10-CM | POA: Diagnosis not present

## 2017-06-08 DIAGNOSIS — E063 Autoimmune thyroiditis: Secondary | ICD-10-CM | POA: Diagnosis not present

## 2017-06-08 DIAGNOSIS — I10 Essential (primary) hypertension: Secondary | ICD-10-CM | POA: Diagnosis not present

## 2017-06-08 MED FILL — PANTOPRAZOLE SOD DR 40 MG T: 40 | 90 days supply | Qty: 90 | Fill #0

## 2017-06-08 MED FILL — CARVEDILOL 3.125 MG TABLET: 3.125 | 30 days supply | Qty: 60 | Fill #1

## 2017-06-08 MED FILL — CONTRAVE ER 8-90 MG TABLET: 8-90 | 30 days supply | Qty: 120 | Fill #1

## 2017-06-09 LAB — TSH: TSH: 1.8 m[IU]/L (ref 0.40–4.50)

## 2017-06-09 LAB — MICROALBUMIN / CREATININE URINE RATIO
Creatinine, Urine: 221 mg/dL (ref 20–275)
MICROALB UR: 1.2 mg/dL
MICROALB/CREAT RATIO: 5 ug/mg{creat} (ref <30)

## 2017-06-09 LAB — COMPREHENSIVE METABOLIC PANEL
AG RATIO: 2 (calc) (ref 1.0–2.5)
ALKALINE PHOSPHATASE (APISO): 69 U/L (ref 33–130)
ALT: 15 U/L (ref 6–29)
AST: 16 U/L (ref 10–35)
Albumin: 4.5 g/dL (ref 3.6–5.1)
BUN/Creatinine Ratio: 16 (calc) (ref 6–22)
BUN: 20 mg/dL (ref 7–25)
CALCIUM: 9.7 mg/dL (ref 8.6–10.4)
CHLORIDE: 102 mmol/L (ref 98–110)
CO2: 22 mmol/L (ref 20–32)
Creat: 1.27 mg/dL — ABNORMAL HIGH (ref 0.50–1.05)
GLOBULIN: 2.3 g/dL (ref 1.9–3.7)
Glucose, Bld: 115 mg/dL — ABNORMAL HIGH (ref 65–99)
Potassium: 4.7 mmol/L (ref 3.5–5.3)
Sodium: 135 mmol/L (ref 135–146)
Total Bilirubin: 0.4 mg/dL (ref 0.2–1.2)
Total Protein: 6.8 g/dL (ref 6.1–8.1)

## 2017-06-09 LAB — LIPID PANEL
Cholesterol: 188 mg/dL (ref <200)
HDL: 67 mg/dL (ref >50)
LDL CHOLESTEROL (CALC): 100 mg/dL — AB
Non-HDL Cholesterol (Calc): 121 mg/dL (calc) (ref <130)
TRIGLYCERIDES: 110 mg/dL (ref <150)
Total CHOL/HDL Ratio: 2.8 (calc) (ref <5.0)

## 2017-06-09 LAB — PTH, INTACT AND CALCIUM
Calcium: 9.7 mg/dL (ref 8.6–10.4)
PTH: 120 pg/mL — AB (ref 14–64)

## 2017-06-09 LAB — VITAMIN D 25 HYDROXY (VIT D DEFICIENCY, FRACTURES): VIT D 25 HYDROXY: 34 ng/mL (ref 30–100)

## 2017-06-09 LAB — T4, FREE: FREE T4: 1.3 ng/dL (ref 0.8–1.8)

## 2017-06-09 LAB — T3, FREE: T3 FREE: 2.3 pg/mL (ref 2.3–4.2)

## 2017-06-14 ENCOUNTER — Ambulatory Visit (INDEPENDENT_AMBULATORY_CARE_PROVIDER_SITE_OTHER): Payer: 59 | Admitting: "Endocrinology

## 2017-06-14 ENCOUNTER — Encounter (INDEPENDENT_AMBULATORY_CARE_PROVIDER_SITE_OTHER): Payer: Self-pay | Admitting: "Endocrinology

## 2017-06-14 ENCOUNTER — Other Ambulatory Visit (INDEPENDENT_AMBULATORY_CARE_PROVIDER_SITE_OTHER): Payer: Self-pay | Admitting: *Deleted

## 2017-06-14 VITALS — BP 144/86 | HR 88 | Ht 63.58 in | Wt 178.4 lb

## 2017-06-14 DIAGNOSIS — F39 Unspecified mood [affective] disorder: Secondary | ICD-10-CM | POA: Diagnosis not present

## 2017-06-14 DIAGNOSIS — I10 Essential (primary) hypertension: Secondary | ICD-10-CM | POA: Diagnosis not present

## 2017-06-14 DIAGNOSIS — E063 Autoimmune thyroiditis: Secondary | ICD-10-CM

## 2017-06-14 DIAGNOSIS — K219 Gastro-esophageal reflux disease without esophagitis: Secondary | ICD-10-CM | POA: Diagnosis not present

## 2017-06-14 DIAGNOSIS — E1143 Type 2 diabetes mellitus with diabetic autonomic (poly)neuropathy: Secondary | ICD-10-CM

## 2017-06-14 DIAGNOSIS — E1142 Type 2 diabetes mellitus with diabetic polyneuropathy: Secondary | ICD-10-CM | POA: Diagnosis not present

## 2017-06-14 DIAGNOSIS — N2581 Secondary hyperparathyroidism of renal origin: Secondary | ICD-10-CM | POA: Diagnosis not present

## 2017-06-14 DIAGNOSIS — E11649 Type 2 diabetes mellitus with hypoglycemia without coma: Secondary | ICD-10-CM | POA: Diagnosis not present

## 2017-06-14 DIAGNOSIS — E1110 Type 2 diabetes mellitus with ketoacidosis without coma: Secondary | ICD-10-CM

## 2017-06-14 DIAGNOSIS — E559 Vitamin D deficiency, unspecified: Secondary | ICD-10-CM

## 2017-06-14 DIAGNOSIS — E1165 Type 2 diabetes mellitus with hyperglycemia: Secondary | ICD-10-CM

## 2017-06-14 DIAGNOSIS — IMO0002 Reserved for concepts with insufficient information to code with codable children: Secondary | ICD-10-CM

## 2017-06-14 DIAGNOSIS — E049 Nontoxic goiter, unspecified: Secondary | ICD-10-CM | POA: Diagnosis not present

## 2017-06-14 LAB — POCT GLUCOSE (DEVICE FOR HOME USE): POC GLUCOSE: 166 mg/dL — AB (ref 70–99)

## 2017-06-14 MED ORDER — VICTOZA 18 MG/3ML ~~LOC~~ SOPN: PEN_INJECTOR | SUBCUTANEOUS | 1 refills | Status: DC

## 2017-06-14 MED ORDER — LEVOTHYROXINE SODIUM 25 MCG PO TABS: ORAL_TABLET | ORAL | 1 refills | Status: DC

## 2017-06-14 MED FILL — LEVOTHYROXINE 25 MCG TABLET: 25 | 90 days supply | Qty: 90 | Fill #0

## 2017-06-14 MED FILL — VICTOZA 18 MG/3 ML INJECT P: 18 | 90 days supply | Qty: 27 | Fill #0

## 2017-06-14 NOTE — Progress Notes (Signed)
Subjective:  Patient Name: Alexa [name] Date of Birth: 10-01-1960  MRN: [id number]  Alexa [name]  presents to the office today for follow-up of her type 2 diabetes mellitus, obesity, combined hyperlipidemia, GERD, hypertension, ASHD, dyspepsia, pedal edema, non-proliferative diabetic retinopathy, goiter, depression, autonomic neuropathy, tachycardia, peripheral neuropathy, acquired hypothyroidism, vitamin D deficiency, hypocalcemia, secondary hyperparathyroidism, glossitis, pallor, fatigue, iron deficiency anemia, anhedonia, disinclination to take medicines, and status post Roux-en-Y gastric bypass.   HISTORY OF PRESENT ILLNESS:   Alexa [name] is a 57 y.o. Caucasian woman. Alexa [name] was unaccompanied.  1. The patient first presented to me on 08/20/04 in referral from her primary care internist, Dr. Emeline General, for evaluation and management of type 2 diabetes, obesity, and multiple medical issues. She was 57 years old.   [name] had a long history of obesity. She was heavy as a child. She underwent menarche at age 26. At age 57 she was 180 pounds. In 1996 she weighed 218 pounds. In 1999 she was diagnosed with type 2 diabetes mellitus. Her weight at that point was 250 pounds. She was treated with Glucophage, and Actos. Actos made her gain even more weight. She had also been treated with glipizide in the past. More recently she had been treated with Lantus and Glucophage plus regular insulin as needed, especially for when she took steroids for asthma attacks. Maximum weight had been 296 pounds one month prior to that first visit with me. Her tendency to gain weight and her difficulty in losing weight were aggravated by long-standing, intermittent depression and by severe, recurrent asthma requiring the use of steroid medications.  B. Past medical history was also positive for a 60% blockage in one of her coronary arteries. She had significant issues with GERD and dyspepsia. She also had combined  hyperlipidemia. She had a previous cardiac catheterization and previous tonsillectomy. She was allergic to theophylline. Her pertinent review of systems was positive for some numbness and tingling in her feet. Family history was positive for type 2 diabetes in her father and her maternal grandmother. Her brother weighed 250 pounds at a height of 76 inches. Both her mother and her sister were hypothyroid.  C. On physical examination, her weight was 279.9 pounds. Her BMI was 49.6. Her blood pressure was 142/86. Her heart rate was 96. Her hemoglobin A1c was 9.8%. She was alert and oriented x3. Her affect was normal. Her insight was fair. She was obviously quite obese. She had a 25 gm thyroid gland. She had 1+ tremor of her hands. She had 1+ DP pulses and 2+ tinea pedis. Sensation to touch was intact in her feet. Laboratory data included a normal CMP. Her cholesterol was 175, triglycerides 76, HDL 53, and LDL 107. Her TSH was 2.98. Since she obviously did have type 2 diabetes mellitus associated with morbid obesity, and since weight loss was a major factor for her, I asked her to resume her metformin twice daily. I also started her on Byetta, initially 5 mcg twice daily and later 10 mcg twice daily. I discontinued her Lantus insulin.  2. During the last 13 years,we have had some successes, some failures, and some new problem areas.  A. T2DM: In October 2007 the combination of Byetta and metformin was causing more gastrointestinal problems. She opted to stop the metformin and continue the Byetta because it was helping her with weight control and blood sugar control. By 08/11/06 her weight had decreased to 267.6 pounds. Hemoglobin A1c was 8.6%. At that point she  decided to have bariatric surgery. She had a Roux-en-Y gastric bypass on 12/20/2006. She subsequently lost weight down to 173.4 pounds on 05/23/08, but then subsequently regained weight to the 190s. Her  hemoglobin A1c values varied in parallel with her weights.  On 05/23/08, at the point of her lowest weight, her hemoglobin A1c dropped to a nadir of 6.3%.  Since then her hemoglobin A1c values have varied between 6.6 and 13.4%. Although we were initially able to stop all of her diabetes medicines after bariatric surgery, when she began to regain weight we restarted metformin, 500 mg twice daily. Although she took her metformin twice daily for a long time, she had discontinued it many years ago. She hated to take medicines and could not make herself be compliant in taking medications or exercising. When her HbA1c increased to 9.4% in September 2015, I stopped her Byetta injections and started her on liraglutide (Victoza) daily injections on 11/16/13.  B. Vitamin D deficiency, hypocalcemia, and secondary hyperparathyroidism: Just before her gastric bypass, I obtained baseline bone mineral metabolism studies. Her 25-hydroxy vitamin D was very low at 7 (normal greater than or equal to 30). Her calcium was 9.5 (normal 8.6-10.6). Her parathyroid hormone was 38.4 (normal 14-72). Subsequent to her surgery, the patient was supposed to be taking multivitamins with calcium and vitamin D, but did not always do so. On 03/29/2007 her 25-hydroxy vitamin D was 29, but her calcium decreased to 8.7. Her PTH was slightly elevated at 73.5. Her 1,25-hydroxy vitamin D was 46. Her iron was 56. I asked her to make sure she took her multivitamins and calcium daily. Unfortunately, she has often been non-compliant with her multivitamins and calcium. Her 25-hydroxy vitamin D values have varied between 15-35. Her calcium values have varied between 8.1-9.4. Her PTH values have remained elevated between 79-167. The patient's iron levels have also been low, between 32-34. She is supposed to be taking iron every day as well. Unfortunately, in August of 2014 she discontinued all vitamins and supplements.   C. The patient has had psychological issues for many years that have adversely affected her eating,  her unwillingness to exercise, her noncompliance with taking medications, her weight, and her T2DM care. Fortunately, her recent outpatient psychological therapy with Ms. Rea College, RN, MS has often resulted in [place] having a much more positive attitude about life in general and a generally more positive attitude about taking her medications. Unfortunately, Kinslei's noncompliance problems often tended to recur.  3. The patient's last [facility] visit was on 04/12/17.   A. She was admitted on 03/14/17 with chest pain and severe hypertension due to not taking her medications. Her cardiac enzymes were normal and her Gated Spect Myocardial perfusion study was also essentially normal. She is now taking all of her prescription medications, but sometimes she still misses doses of calcium and vitamins. She has trouble swallowing thee MVI, so I suggested gummis or kids vitamins.   B. She has been healthy. She has been eating less since being on the Contrave, 2 pills, twice daily.   Joylene Igo remains in counseling with Ms. Showfety.    D. She has not had much of a problem with asthma this Spring.    E. She has been sleeping okay, except for her husband's snoring. She still has some hot flashes, but not as bad as they were. She has not had many leg cramps during the night.   F. She has almost totally stopped drinking Coke. She has not been  exercising.   G. She is taking Lexapro, 20 mg/day; Synthroid, 25 mcg/day; Micardis, 60 mg/day; Victoza,1.8 mg/day; Protonix, 40 mg/day; metformin 1000 mg, twice daily. She takes two Citracal tablets once daily at about dinner time. She is also taking Coreg, 3.125 mg, twice daily.  H. She is making progress at not allowing herself to become over-committed. She is trying to achieve more balance.   I. She has had more low BG symptom recently, more often in the late mornings  4. Review of Systems: There are no other significant issues  Constitutional: The patient feels "good".    Eyes: Vision has been okay. She will have an eye exam in June. The last eye exam that I am aware of occurred in the Spring of 2014. Neck: She has not had any recent trouble swallowing at the level of her mid-neck. The patient has no complaints of anterior neck swelling, soreness, tenderness, pressure, or discomfort.  Heart: She has notes some faster heart rates when she is anxious. She has not had any further chest pain or pressure. Heart rate increases with exercise or other physical activity.  She has no complaints of palpitations or irregular heat beats Gastrointestinal: She has had more nausea in the mornings or after eating certain foods. She has also had more constipation recently. She has an occasional sensation of discomfort in her LUQ. She has also noted more flatus. She has probably not been drinking enough fluid. She has not had any post-prandial bloating recently. She does not have any excessive head hunger or belly hunger, acid reflux, upset stomach, stomach aches or pains, swallowing difficulties, or diarrhea. Legs: She no longer has occasional nocturnal calf cramps. Muscle mass and strength seem normal. There are no new complaints of numbness, tingling, or burning. No edema is noted. Feet: She has been having some nocturnal foot cramps, but much less frequently and not at all severe. There are no new complaints of numbness, tingling, or burning. No edema is noted. Tinea improves when she uses her anti-fungal cream.  Neuro: No new sensory or muscular problems Psych: She is doing well.    GU: She has been urinating a lot. She occasionally has nocturia.  Hypoglycemia: She has been having more low BG symptoms.   5. BG printout: She did not bring in her BG meter today. She has not been checking her BGs. I discussed the use of the [facility] with her, but she declined due to problems with health insurance coverage.  PAST MEDICAL, FAMILY, AND SOCIAL HISTORY:  Past Medical History:   Diagnosis Date  . Anemia, iron deficiency   . Asthma   . Asthma, chronic   . CAD (coronary artery disease)   . Chronic insomnia   . Combined hyperlipidemia   . Depression   . Diabetes mellitus   . Diabetes mellitus type II   . Diabetic autonomic neuropathy ([place])   . DM type 2 with diabetic peripheral neuropathy ([place])   . Dyspepsia   . Elevated homocysteine ([place])   . Essential tremor   . Fatigue   . GERD (gastroesophageal reflux disease)   . GERD (gastroesophageal reflux disease)   . Glossitis   . Goiter   . Hyperparathyroidism , secondary, non-renal ([place])   . Hypertension   . Nonproliferative diabetic retinopathy associated with type 2 diabetes mellitus ([place])    RESOLVED  . Obesity, Class III, BMI 40-49.9 (morbid obesity) ([place])   . Pallor   . Tachycardia   . Thyroiditis, autoimmune   .  Vitamin D deficiency     Family History  Problem Relation Age of Onset  . Thyroid disease Mother        Hypothyroid  . Diabetes Father        T2 DM  . Obesity Brother        250 pounds at a height of 76 inches.  . Thyroid disease Sister        Hypothyroid  . Diabetes Maternal Grandmother   . Colon cancer Other        great grandfather  . Liver disease Maternal Aunt        NASH  . Esophageal cancer Neg Hx   . Pancreatic cancer Neg Hx   . Kidney disease Neg Hx      Current Outpatient Medications:  .  COREG 3.125 MG tablet, Take 1 tablet (3.125 mg total) by mouth 2 (two) times daily with a meal., Disp: 60 tablet, Rfl: 11 .  albuterol (PROVENTIL HFA;VENTOLIN HFA) 108 (90 Base) MCG/ACT inhaler, Inhale 2 puffs into the lungs every 6 (six) hours as needed for wheezing or shortness of breath., Disp: 1 Inhaler, Rfl: 11 .  albuterol (PROVENTIL) (2.5 MG/3ML) 0.083% nebulizer solution, Take 3 mLs (2.5 mg total) by nebulization every 6 (six) hours as needed for wheezing or shortness of breath., Disp: 150 mL, Rfl: 1 .  aspirin EC 81 MG EC tablet, Take 1 tablet (81 mg total) by mouth daily.  (Patient not taking: Reported on 04/12/2017), Disp: 30 tablet, Rfl: 0 .  Calcium Carbonate-Vitamin D (CALCIUM-VITAMIN D) 500-200 MG-UNIT per tablet, Take 1 tablet by mouth 2 (two) times daily with a meal. Reported on 02/26/2015, Disp: , Rfl:  .  clonazePAM (KLONOPIN) 0.5 MG tablet, Take 1 tablet (0.5 mg total) by mouth 2 (two) times daily as needed for anxiety., Disp: 30 tablet, Rfl: 1 .  conjugated estrogens (PREMARIN) vaginal cream, Place 1 Applicatorful vaginally daily. (Patient taking differently: Place 1 Applicatorful vaginally daily as needed (for dryness). ), Disp: 42.5 g, Rfl: 11 .  cyclobenzaprine (FLEXERIL) 10 MG tablet, Take 1 tablet (10 mg total) by mouth 3 (three) times daily as needed for muscle spasms., Disp: 90 tablet, Rfl: 0 .  diphenhydrAMINE (BENADRYL) 25 MG tablet, Take 25-50 mg by mouth 2 (two) times daily as needed for allergies or sleep., Disp: , Rfl:  .  escitalopram (LEXAPRO) 20 MG tablet, Take 1 tablet (20 mg total) by mouth daily., Disp: 90 tablet, Rfl: 1 .  Ferrous Sulfate (SLOW FE PO), Take 1 tablet by mouth 3 (three) times a week., Disp: , Rfl:  .  levothyroxine (SYNTHROID, LEVOTHROID) 25 MCG tablet, Take one tablet daily. (Patient taking differently: Take 25 mcg by mouth daily before breakfast. ), Disp: 90 tablet, Rfl: 3 .  metFORMIN (GLUCOPHAGE) 1000 MG tablet, Take 1,000 mg by mouth 2 (two) times daily with a meal., Disp: , Rfl:  .  metFORMIN (GLUCOPHAGE) 500 MG tablet, TAKE 1 TABLET BY MOUTH TWICE DAILY WITH BREAKFAST AND DINNER, Disp: 180 tablet, Rfl: 3 .  Multiple Vitamin (MULTIVITAMIN) tablet, Take 1 tablet by mouth daily. Reported on 02/26/2015, Disp: 30 tablet, Rfl: 0 .  Naltrexone-buPROPion HCl ER (CONTRAVE) 8-90 MG TB12, Take 2 tablets by mouth 2 (two) times daily., Disp: , Rfl:  .  ondansetron (ZOFRAN) 4 MG tablet, Take 1 tablet (4 mg total) by mouth every 8 (eight) hours as needed for nausea or vomiting., Disp: 20 tablet, Rfl: 0 .  pantoprazole (PROTONIX) 40 MG  tablet, TAKE  1 TABLET (40 MG TOTAL) BY MOUTH DAILY., Disp: 90 tablet, Rfl: 4 .  telmisartan (MICARDIS) 40 MG tablet, TAKE 1 TABLET BY MOUTH EACH MORNING. (Patient taking differently: Take 40 mg by mouth daily. ), Disp: 90 tablet, Rfl: 3 .  traMADol (ULTRAM) 50 MG tablet, Take 1 tablet (50 mg total) by mouth every 8 (eight) hours as needed. (Patient taking differently: Take 50 mg by mouth every 8 (eight) hours as needed (for pain). ), Disp: 90 tablet, Rfl: 1 .  VICTOZA 18 MG/3ML SOPN, INJECT 1.8 MG DAILY (Patient taking differently: Inject 1.8 mg into the skin once a day), Disp: 27 mL, Rfl: PRN  Allergies as of 06/14/2017 - Review Complete 06/14/2017  Allergen Reaction Noted  . Theophyllines Palpitations 05/13/2010    1. 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 2. Activities: She has not done much physical activity.  3. Smoking, alcohol, or drugs: None 4. Primary Care Provider: Dr. Tommie Ard Baxley 5. Therapist: Ms. Rea College, MS  REVIEW OF SYSTEM: There are no other significant problems involving [place]'s other body systems.   Objective:  Vital Signs:  BP (!) 144/86   Pulse 88   Ht 5' 3.58" (1.615 m)   Wt 178 lb 6.4 oz (80.9 kg)   LMP 05/11/2010   BMI 31.03 kg/m    Ht Readings from Last 3 Encounters:  06/14/17 5' 3.58" (1.615 m)  03/11/17 5\' 3"  (1.6 m)  03/05/17 5\' 3"  (1.6 m)   Wt Readings from Last 3 Encounters:  06/14/17 178 lb 6.4 oz (80.9 kg)  04/12/17 192 lb 9.6 oz (87.4 kg)  03/11/17 195 lb (88.5 kg)   PHYSICAL EXAM:  Constitutional:  The patient looks good today. She is alert and bright. Her affect and insight are very good. She is wearing a blouse and slacks today, but is not wearing lipstick today. She has lost another 14 pounds in 3 months, equivalent to a net caloric loss of 480 calories per day.  Her weight is almost down to the nadir of  173.4 pounds that it was in 2010. Eyes: There is no arcus or proptosis.  Mouth: The oropharynx appears normal. The tongue appears normal. There is normal oral moisture. There is no obvious gingivitis. Neck: There are no bruits present. The thyroid gland appears normal in size. The thyroid gland is again within normal limits at 20 grams in size. The consistency of the thyroid gland is normal. There is no thyroid tenderness to palpation. Lungs: The lungs are clear. Air movement is good. Heart: The heart rhythm and rate appear normal. Heart sounds S1 and S2 are normal. I do not appreciate any pathologic heart murmurs. Abdomen: The abdomen is enlarged. Bowel sounds are normal. The abdomen is soft and non-tender. There is no obviously palpable hepatomegaly, splenomegaly, or other masses.  Arms: Muscle mass appears appropriate for age.  Hands: She has a 2+ tremor. Phalangeal and metacarpophalangeal joints appear normal. Palms are normal. Legs: Muscle mass appears appropriate for age. There is no edema.  Feet: There are no significant deformities. Dorsalis pedis pulses are normal 1+ bilaterally. She has 1+ tinea in her great toenails.  Neurologic: Muscle strength is normal for age and gender  in both the upper and the lower extremities. Muscle tone appears normal. Sensation to touch is normal in the legs and feet.  LAB DATA:  Labs 06/14/17: CBG 166  Labs 06/09/17: TSH 1.80, free T4 1.3, free T3 2.3; CMP normal except for glucose 115 and creatinine 1.27; PTH 120, calcium 9.7, 25-OH vitamin D 34;cholesterol 188, triglycerides 110, HDL 67, LDL 100; urine microalbumin/creatinine ratio   Labs 04/12/17: HbA1c 10.0%, CBG 174  Labs 03/04/17: Sodium 131, potassium 4.0, chloride 99, CO2 20, glucose 515, calcium 8.9  Labs 01/11/17: CBG 198  Labs 11/24/16: CBG 468; urine glucose 2000+, negative ketones  Labs 11/17/16: HbA1c 13.4%; TSH 1.54; cholesterol 173, triglycerides 91, HDL 71, LDL 83; 25-OH vitamin D  21; CMP normal except for glucose 314; CBC normal except MCHC 26.5 (ref 27-33)   06/04/16: HBA1c 8.1%, CBG 115  05/27/16: TSH 2.62, free T4 1.1, free T3 2.3; CMP normal except for glucose 161, and creatinine 1.09; cholesterol 187, triglycerides 77, HDL 74, LDL 98; urinary microalbumin/creatinine ratio 6; PTH 112, calcium 9.2, 25-OH vitamin D 33  03/17/16: CBG 145  Labs 12/31/15: HbA1c 13.4%, CBG 390; CMP normal except for glucose 344 and creatinine 1.14  Labs 10/08/15: HbA1c 9.6%  Labs 07/02/15: HbA1c 8.9%; PTH 107, calcium 9.2, 25-OH vitamin D 33; CBC normal; CMP normal except for creatinine 1.07; cholesterol 174, triglycerides 91, HDL 74, LDL 82; TSH 2.43, free T4 1.2, free T3 2.3  Labs 02/19/15: PTH 99, calcium 8.7, 25-OH vitamin D 20  Labs 10/08/14: HbA1c 8.0%   Labs 08/06/14: HbA1c 7.4%  Labs 08/01/14: TSH 2.014, free T4 0.87, free T3 2.4; PTH 79 (normal 14-64), calcium 9.1; 25-OH vitamin D 35 CMP normal  Labs 05/11/14: HbA1c was 7.7%, without any low BGs; TSH 2.416, free T4 1.13, free T3 2.3  Labs 02/22/14: HbA1c 11.6%  Labs 02/13/14: Calcium 9.0, PTH 131, 25-OH vitamin D 15; CMP normal except for glucose of 165; cholesterol 177, triglycerides 108, HDL 90, LDL 65; urinary microalbumin/creatinine ratio 15.6; TSH 3.639, free T4 1.30, free T3 2.5  Labs 10/10/13: HbA1c is 9.4%, compared with 8.1% at last visit and with 9.3% in December;   Labs 05/08/13: HbA1c 8.1%; PTH 90.2, calcium 9.5, 25-hydroxy vitamin D 46; CMP normal except for glucose of 215; WBC 8.0, RBC 5.20, Hgb 12.5, Hct 37.8%, MCV 72.7 (normal 78-100), iron 55  Labs 02/23/13: CMP normal, except glucose 194 and alkaline phosphatase 126; microalbumin/creatinine ratio was 11.5; TSH 2.086, free T4 1.29, free T3 3.2, TPO antibody < 10; 25-hydroxy vitamin D 18; C-peptide 1.79; cholesterol 156, triglycerides 64, HDL 61, LDL 82  Labs 01/05/13: Hemoglobin A1c was 9.3%, compared with 9.3 at last visit and with 9.1% at the visit prior. All of  these values were major increases from 6.6% in May 2012.    Labs 04/14/12: 25-hydroxy vitamin D 21, iron 203,  Vitamin B12 379,   Labs 11/16/11: Hgb 10.3, Hct 32.4%; CMP normal except glucose 146' cholesterol 201, triglycerides 70, HDL 66, LDL 121; vitamin D 22           Labs 06/09/10: Cholesterol was 175, triglycerides 76, HDL 62, LDL 98. TSH was 1.217. Free T4 was 1.10. Free T3 was 2.7. 25-hydroxy vitamin D was 19. PTH was 167.   Assessment and Plan:   ASSESSMENT:  1. Type 2 diabetes mellitus:   A. Her T2DM was out of control at her December 2017 visit due to her not taking medications reliably and to not exercising. Her HbA1c of 13.4% was probably the highest that it had ever been up to that time..    B. At her visit in  May 2018 her HbA1c has decreased to 8.0%. Her BG control improved dramatically when she took her medications and watched her carb intake. Contrave was helping.   C. Unfortunately, she later relapsed in terms of not taking her medications and other maladaptive health behaviors. Her HbA1c increased back to 13.4% as of 11/17/16.   D. After becoming very frightened by her chest pain and admission in February 2019, she has resumed taking her medications and is doing much better in terms of her DM. Her HbA1c at her last visit in March 2019 was still  too high, but much lower that it was 5 months ago. Since resuming her DM and psych medications her BGs and her mood have improved.  2. Hypoglycemia: She has had more hypoglycemia symptoms recently. I recommended that she carry at least 40 grams of glucose with her at all times.  3. Obesity: Her weight has decreased 14 pounds since her last visit. The Contrave, Victoza, and her other medications are helping her. She needs to continue to take her medications, continue to Eat Right and exercise daily.    4-5. Autonomic neuropathy and tachycardia: The patient's autonomic neuropathy and  heart rate improved as her BGs decreased.    6.  Hypertension: Her BP is higher despite resuming her medications. She needs to try to fit in exercise.  7-8. Anhedonia and anxiety: She is doing much better now, partly due to taking her meds and partly due to working out some of the family problems. She needs to call for help when she begins to do poorly. She also needs to continue to find ways to have more joy in her life. 9-12. GERD/esophagitis/difficulty swallowing/gastroparesis: Her reflux, other GI symptoms, and difficulty swallowing have all improved.   13-15: Hypocalcemia, vitamin D deficiency, and secondary hyperparathyroidism:   A. Due to her bariatric surgery she has more difficulty absorbing vitamin D and calcium. When she develops hypocalcemia and vitamin D deficiency, her parathyroid glands appropriated produce more PTH, resulting in secondary hyperparathyroidism. However, if she takes her calcium and vitamin D all three parameters will normalize.   B. Her calcium was normal in October 2018, but at the lower limit of normal in February 2019. Her vitamin D level was normal in May 2018, but low in October 2018.   C. In May 2019 her PTH was elevated to 120 and her calcium was 9.7. She still appears to have secondary hyperparathyroidism. She needs to take her vitamin D and calcium.  33. Noncompliance: Since last visit her compliance with eating right and taking her medications has greatly improved.   17: Muscle cramps: Her cramps occur much less frequently. Her potassium and calcium were normal in May and October 2018. She may still be more dehydrated at times.    18. Hypothyroidism: Her TFTs in May 2019 were normal.   PLAN:  1. Diagnostic: None at this time.  2. Therapeutic: Continue medications as currently prescribed, except reduce metformin to 500 mg in the mornings and 1000 mg in the evenings. Switch Synthroid to bedtime. Take calcium at mealtimes. Take [employer], 50,000 IU per week to improve her vitamin D level. Try to fit in exercise  daily.  See Ms. Showfety in follow up.   3. Patient education: We discussed all of the above at great length. She must continue to take her medications and supplements. She also needs to see me every two months. When she knows that she is coming to see "The [facility]veen Reeks", her compliance improves.  4. Follow-up: 2 months   Level of Service: This visit lasted in excess of 60 minutes. More than 50% of the visit was devoted to counseling.   Tillman Sers, MD, CDE Adult and Pediatric Endocrinology 06/14/2017 2:22 PM

## 2017-06-14 NOTE — Patient Instructions (Signed)
Follow up visit in two months.  

## 2017-06-15 ENCOUNTER — Other Ambulatory Visit (INDEPENDENT_AMBULATORY_CARE_PROVIDER_SITE_OTHER): Payer: Self-pay | Admitting: *Deleted

## 2017-06-15 DIAGNOSIS — E211 Secondary hyperparathyroidism, not elsewhere classified: Secondary | ICD-10-CM

## 2017-06-15 MED ORDER — ESCITALOPRAM OXALATE 20 MG PO TABS: 20.0000 mg | ORAL_TABLET | Freq: Every day | ORAL | 1 refills | Status: DC

## 2017-06-15 MED FILL — ESCITALOPRAM 20 MG TABLET: 20 | 90 days supply | Qty: 90 | Fill #0

## 2017-06-16 ENCOUNTER — Telehealth (INDEPENDENT_AMBULATORY_CARE_PROVIDER_SITE_OTHER): Payer: Self-pay | Admitting: "Endocrinology

## 2017-06-16 ENCOUNTER — Other Ambulatory Visit (INDEPENDENT_AMBULATORY_CARE_PROVIDER_SITE_OTHER): Payer: Self-pay | Admitting: *Deleted

## 2017-06-16 MED ORDER — METFORMIN HCL 1000 MG PO TABS: 1000.0000 mg | ORAL_TABLET | Freq: Two times a day (BID) | ORAL | 1 refills | Status: DC

## 2017-06-16 NOTE — Telephone Encounter (Signed)
Spoke to patient, advised script sent for 1000mg  metformin take twice daily.

## 2017-06-16 NOTE — Telephone Encounter (Signed)
°  Who's calling (name and relationship to patient) : Kristien (Self) Best contact number: [phone] Provider they see: Dr. Tobe Sos Reason for call: Abreanna would like to speak with Adair Laundry regarding her Metformin rx.

## 2017-06-16 NOTE — Telephone Encounter (Signed)
MyChart Message sent  

## 2017-06-17 ENCOUNTER — Other Ambulatory Visit (INDEPENDENT_AMBULATORY_CARE_PROVIDER_SITE_OTHER): Payer: Self-pay | Admitting: "Endocrinology

## 2017-06-17 DIAGNOSIS — Z139 Encounter for screening, unspecified: Secondary | ICD-10-CM

## 2017-06-17 MED FILL — metFORMIN HCL 1000 MG TABS: 1000 | 90 days supply | Qty: 180 | Fill #0

## 2017-07-09 MED FILL — CARVEDILOL 3.125 MG TABLET: 3.125 | 30 days supply | Qty: 60 | Fill #2

## 2017-07-09 MED FILL — CONTRAVE ER 8-90 MG TABLET: 8-90 | 30 days supply | Qty: 120 | Fill #2

## 2017-07-12 ENCOUNTER — Ambulatory Visit (INDEPENDENT_AMBULATORY_CARE_PROVIDER_SITE_OTHER): Payer: 59

## 2017-07-12 ENCOUNTER — Encounter: Payer: Self-pay | Admitting: Internal Medicine

## 2017-07-12 ENCOUNTER — Ambulatory Visit (INDEPENDENT_AMBULATORY_CARE_PROVIDER_SITE_OTHER): Payer: 59 | Admitting: Podiatry

## 2017-07-12 ENCOUNTER — Ambulatory Visit: Payer: 59 | Admitting: Internal Medicine

## 2017-07-12 ENCOUNTER — Encounter: Payer: 59 | Admitting: Podiatry

## 2017-07-12 ENCOUNTER — Telehealth: Payer: Self-pay | Admitting: Internal Medicine

## 2017-07-12 DIAGNOSIS — M2042 Other hammer toe(s) (acquired), left foot: Secondary | ICD-10-CM

## 2017-07-12 DIAGNOSIS — M21619 Bunion of unspecified foot: Secondary | ICD-10-CM

## 2017-07-12 DIAGNOSIS — B351 Tinea unguium: Secondary | ICD-10-CM

## 2017-07-12 DIAGNOSIS — Z79899 Other long term (current) drug therapy: Secondary | ICD-10-CM | POA: Diagnosis not present

## 2017-07-12 DIAGNOSIS — M722 Plantar fascial fibromatosis: Secondary | ICD-10-CM | POA: Diagnosis not present

## 2017-07-12 DIAGNOSIS — M2041 Other hammer toe(s) (acquired), right foot: Secondary | ICD-10-CM

## 2017-07-12 DIAGNOSIS — L601 Onycholysis: Secondary | ICD-10-CM | POA: Diagnosis not present

## 2017-07-12 DIAGNOSIS — F39 Unspecified mood [affective] disorder: Secondary | ICD-10-CM | POA: Diagnosis not present

## 2017-07-12 NOTE — Telephone Encounter (Signed)
Pt had made appt to discuss breast reduction surgery and possible abdominoplasty. Asked her to check with plastic surgeon of choice and see what is required first.

## 2017-07-12 NOTE — Patient Instructions (Addendum)
Plantar Fasciitis (Heel Spur Syndrome) with Rehab The plantar fascia is a fibrous, ligament-like, soft-tissue structure that spans the bottom of the foot. Plantar fasciitis is a condition that causes pain in the foot due to inflammation of the tissue. SYMPTOMS   Pain and tenderness on the underneath side of the foot.  Pain that worsens with standing or walking. CAUSES  Plantar fasciitis is caused by irritation and injury to the plantar fascia on the underneath side of the foot. Common mechanisms of injury include:  Direct trauma to bottom of the foot.  Damage to a small nerve that runs under the foot where the main fascia attaches to the heel bone.  Stress placed on the plantar fascia due to bone spurs. RISK INCREASES WITH:   Activities that place stress on the plantar fascia (running, jumping, pivoting, or cutting).  Poor strength and flexibility.  Improperly fitted shoes.  Tight calf muscles.  Flat feet.  Failure to warm-up properly before activity.  Obesity. PREVENTION  Warm up and stretch properly before activity.  Allow for adequate recovery between workouts.  Maintain physical fitness:  Strength, flexibility, and endurance.  Cardiovascular fitness.  Maintain a health body weight.  Avoid stress on the plantar fascia.  Wear properly fitted shoes, including arch supports for individuals who have flat feet.  PROGNOSIS  If treated properly, then the symptoms of plantar fasciitis usually resolve without surgery. However, occasionally surgery is necessary.  RELATED COMPLICATIONS   Recurrent symptoms that may result in a chronic condition.  Problems of the lower back that are caused by compensating for the injury, such as limping.  Pain or weakness of the foot during push-off following surgery.  Chronic inflammation, scarring, and partial or complete fascia tear, occurring more often from repeated injections.  TREATMENT  Treatment initially involves the  use of ice and medication to help reduce pain and inflammation. The use of strengthening and stretching exercises may help reduce pain with activity, especially stretches of the Achilles tendon. These exercises may be performed at home or with a therapist. Your caregiver may recommend that you use heel cups of arch supports to help reduce stress on the plantar fascia. Occasionally, corticosteroid injections are given to reduce inflammation. If symptoms persist for greater than 6 months despite non-surgical (conservative), then surgery may be recommended.   MEDICATION   If pain medication is necessary, then nonsteroidal anti-inflammatory medications, such as aspirin and ibuprofen, or other minor pain relievers, such as acetaminophen, are often recommended.  Do not take pain medication within 7 days before surgery.  Prescription pain relievers may be given if deemed necessary by your caregiver. Use only as directed and only as much as you need.  Corticosteroid injections may be given by your caregiver. These injections should be reserved for the most serious cases, because they may only be given a certain number of times.  HEAT AND COLD  Cold treatment (icing) relieves pain and reduces inflammation. Cold treatment should be applied for 10 to 15 minutes every 2 to 3 hours for inflammation and pain and immediately after any activity that aggravates your symptoms. Use ice packs or massage the area with a piece of ice (ice massage).  Heat treatment may be used prior to performing the stretching and strengthening activities prescribed by your caregiver, physical therapist, or athletic trainer. Use a heat pack or soak the injury in warm water.  SEEK IMMEDIATE MEDICAL CARE IF:  Treatment seems to offer no benefit, or the condition worsens.  Any medications   produce adverse side effects.  EXERCISES- RANGE OF MOTION (ROM) AND STRETCHING EXERCISES - Plantar Fasciitis (Heel Spur Syndrome) These exercises  may help you when beginning to rehabilitate your injury. Your symptoms may resolve with or without further involvement from your physician, physical therapist or athletic trainer. While completing these exercises, remember:   Restoring tissue flexibility helps normal motion to return to the joints. This allows healthier, less painful movement and activity.  An effective stretch should be held for at least 30 seconds.  A stretch should never be painful. You should only feel a gentle lengthening or release in the stretched tissue.  RANGE OF MOTION - Toe Extension, Flexion  Sit with your right / left leg crossed over your opposite knee.  Grasp your toes and gently pull them back toward the top of your foot. You should feel a stretch on the bottom of your toes and/or foot.  Hold this stretch for 10 seconds.  Now, gently pull your toes toward the bottom of your foot. You should feel a stretch on the top of your toes and or foot.  Hold this stretch for 10 seconds. Repeat  times. Complete this stretch 3 times per day.   RANGE OF MOTION - Ankle Dorsiflexion, Active Assisted  Remove shoes and sit on a chair that is preferably not on a carpeted surface.  Place right / left foot under knee. Extend your opposite leg for support.  Keeping your heel down, slide your right / left foot back toward the chair until you feel a stretch at your ankle or calf. If you do not feel a stretch, slide your bottom forward to the edge of the chair, while still keeping your heel down.  Hold this stretch for 10 seconds. Repeat 3 times. Complete this stretch 2 times per day.   STRETCH  Gastroc, Standing  Place hands on wall.  Extend right / left leg, keeping the front knee somewhat bent.  Slightly point your toes inward on your back foot.  Keeping your right / left heel on the floor and your knee straight, shift your weight toward the wall, not allowing your back to arch.  You should feel a gentle stretch  in the right / left calf. Hold this position for 10 seconds. Repeat 3 times. Complete this stretch 2 times per day.  STRETCH  Soleus, Standing  Place hands on wall.  Extend right / left leg, keeping the other knee somewhat bent.  Slightly point your toes inward on your back foot.  Keep your right / left heel on the floor, bend your back knee, and slightly shift your weight over the back leg so that you feel a gentle stretch deep in your back calf.  Hold this position for 10 seconds. Repeat 3 times. Complete this stretch 2 times per day.  STRETCH  Gastrocsoleus, Standing  Note: This exercise can place a lot of stress on your foot and ankle. Please complete this exercise only if specifically instructed by your caregiver.   Place the ball of your right / left foot on a step, keeping your other foot firmly on the same step.  Hold on to the wall or a rail for balance.  Slowly lift your other foot, allowing your body weight to press your heel down over the edge of the step.  You should feel a stretch in your right / left calf.  Hold this position for 10 seconds.  Repeat this exercise with a slight bend in your right /   left knee. Repeat 3 times. Complete this stretch 2 times per day.   STRENGTHENING EXERCISES - Plantar Fasciitis (Heel Spur Syndrome)  These exercises may help you when beginning to rehabilitate your injury. They may resolve your symptoms with or without further involvement from your physician, physical therapist or athletic trainer. While completing these exercises, remember:   Muscles can gain both the endurance and the strength needed for everyday activities through controlled exercises.  Complete these exercises as instructed by your physician, physical therapist or athletic trainer. Progress the resistance and repetitions only as guided.  STRENGTH - Towel Curls  Sit in a chair positioned on a non-carpeted surface.  Place your foot on a towel, keeping your heel  on the floor.  Pull the towel toward your heel by only curling your toes. Keep your heel on the floor. Repeat 3 times. Complete this exercise 2 times per day.  STRENGTH - Ankle Inversion  Secure one end of a rubber exercise band/tubing to a fixed object (table, pole). Loop the other end around your foot just before your toes.  Place your fists between your knees. This will focus your strengthening at your ankle.  Slowly, pull your big toe up and in, making sure the band/tubing is positioned to resist the entire motion.  Hold this position for 10 seconds.  Have your muscles resist the band/tubing as it slowly pulls your foot back to the starting position. Repeat 3 times. Complete this exercises 2 times per day.  Document Released: 01/12/2005 Document Revised: 04/06/2011 Document Reviewed: 04/26/2008 [facility] Patient Information 2014 [place], [place].   Hammer Toe Hammer toe is a change in the shape (a deformity) of your second, third, or fourth toe. The deformity causes the middle joint of your toe to stay bent. This causes pain, especially when you are wearing shoes. Hammer toe starts gradually. At first, the toe can be straightened. Gradually over time, the deformity becomes stiff and permanent. Early treatments to keep the toe straight may relieve pain. As the deformity becomes stiff and permanent, surgery may be needed to straighten the toe. What are the causes? Hammer toe is caused by abnormal bending of the toe joint that is closest to your foot. It happens gradually over time. This pulls on the muscles and connections (tendons) of the toe joint, making them weak and stiff. It is often related to wearing shoes that are too short or narrow and do not let your toes straighten. What increases the risk? You may be at greater risk for hammer toe if you:  Are female.  Are older.  Wear shoes that are too small.  Wear high-heeled shoes that pinch your toes.  Are a Web designer.  Have a second toe that is longer than your big toe (first toe).  Injure your foot or toe.  Have arthritis.  Have a family history of hammer toe.  Have a nerve or muscle disorder.  What are the signs or symptoms? The main symptoms of this condition are pain and deformity of the toe. The pain is worse when wearing shoes, walking, or running. Other symptoms may include:  Corns or calluses over the bent part of the toe or between the toes.  Redness and a burning feeling on the toe.  An open sore that forms on the top of the toe.  Not being able to straighten the toe.  How is this diagnosed? This condition is diagnosed based on your symptoms and a physical exam. During the exam, your  health care provider will try to straighten your toe to see how stiff the deformity is. You may also have tests, such as:  A blood test to check for rheumatoid arthritis.  An X-ray to show how severe the deformity is.  How is this treated? Treatment for this condition will depend on how stiff the deformity is. Surgery is often needed. However, sometimes a hammer toe can be straightened without surgery. Treatments that do not involve surgery include:  Taping the toe into a straightened position.  Using pads and cushions to protect the toe (orthotics).  Wearing shoes that provide enough room for the toes.  Doing toe-stretching exercises at home.  Taking an NSAID to reduce pain and swelling.  If these treatments do not help or the toe cannot be straightened, surgery is the next option. The most common surgeries used to straighten a hammer toe include:  Arthroplasty. In this procedure, part of the joint is removed, and that allows the toe to straighten.  Fusion. In this procedure, cartilage between the two bones of the joint is taken out and the bones are fused together into one longer bone.  Implantation. In this procedure, part of the bone is removed and replaced with an implant to let  the toe move again.  Flexor tendon transfer. In this procedure, the tendons that curl the toes down (flexor tendons) are repositioned.  Follow these instructions at home:  Take over-the-counter and prescription medicines only as told by your health care provider.  Do toe straightening and stretching exercises as told by your health care provider.  Keep all follow-up visits as told by your health care provider. This is important. How is this prevented?  Wear shoes that give your toes enough room and do not cause pain.  Do not wear high-heeled shoes. Contact a health care provider if:  Your pain gets worse.  Your toe becomes red or swollen.  You develop an open sore on your toe. This information is not intended to replace advice given to you by your health care provider. Make sure you discuss any questions you have with your health care provider. Document Released: 01/10/2000 Document Revised: 08/02/2015 Document Reviewed: 05/08/2015 Elsevier Interactive Patient Education  2018 [employer] A bunion is a bump on the base of the big toe that forms when the bones of the big toe joint move out of position. Bunions may be small at first, but they often get larger over time. The can make walking painful. What are the causes? A bunion may be caused by:  Wearing narrow or pointed shoes that force the big toe to press against the other toes.  Abnormal foot development that causes the foot to roll inward (pronate).  Changes in the foot that are caused by certain diseases, such as rheumatoid arthritis and polio.  A foot injury.  What increases the risk? The following factors may make you more likely to develop this condition:  Wearing shoes that squeeze the toes together.  Having certain diseases, such as: ? Rheumatoid arthritis. ? Polio. ? Cerebral palsy.  Having family members who have bunions.  Being born with a foot deformity, such as flat feet or low  arches.  Doing activities that put a lot of pressure on the feet, such as ballet dancing.  What are the signs or symptoms? The main symptom of a bunion is a noticeable bump on the big toe. Other symptoms may include:  Pain.  Swelling around the big  toe.  Redness and inflammation.  Thick or hardened skin on the big toe or between the toes.  Stiffness or loss of motion in the big toe.  Trouble with walking.  How is this diagnosed? A bunion may be diagnosed based on your symptoms, medical history, and activities. You may have tests, such as:  X-rays. These allow your health care provider to check the position of the bones in your foot and look for damage to your joint. They also help your health care provider to determine the severity of your bunion and the best way to treat it.  Joint aspiration. In this test, a sample of fluid is removed from the toe joint. This test, which may be done if you are in a lot of pain, helps to rule out diseases that cause painful swelling of the joints, such as arthritis.  How is this treated? There is no cure for a bunion, but treatment can help to prevent a bunion from getting worse. Treatment depends on the severity of your symptoms. Your health care provider may recommend:  Wearing shoes that have a wide toe box.  Using bunion pads to cushion the affected area.  Taping your toes together to keep them in a normal position.  Placing a device inside your shoe (orthotics) to help reduce pressure on your toe joint.  Taking medicine to ease pain, inflammation, and swelling.  Applying heat or ice to the affected area.  Doing stretching exercises.  Surgery to remove scar tissue and move the toes back into their normal position. This treatment is rare.  Follow these instructions at home:  Support your toe joint with proper footwear, shoe padding, or taping as told by your health care provider.  Take over-the-counter and prescription medicines  only as told by your health care provider.  If directed, apply ice to the injured area: ? Put ice in a plastic bag. ? Place a towel between your skin and the bag. ? Leave the ice on for 20 minutes, 2-3 times per day.  If directed, apply heat to the affected area before you exercise. Use the heat source that your health care provider recommends, such as a moist heat pack or a heating pad. ? Place a towel between your skin and the heat source. ? Leave the heat on for 20-30 minutes. ? Remove the heat if your skin turns bright red. This is especially important if you are unable to feel pain, heat, or cold. You may have a greater risk of getting burned.  Do exercises as told by your health care provider.  Keep all follow-up visits as told by your health care provider. Contact a health care provider if:  Your symptoms get worse.  Your symptoms do not improve in 2 weeks. Get help right away if:  You have severe pain and trouble with walking. This information is not intended to replace advice given to you by your health care provider. Make sure you discuss any questions you have with your health care provider. Document Released: 01/12/2005 Document Revised: 06/20/2015 Document Reviewed: 08/12/2014 Elsevier Interactive Patient Education  2018 [employer].  Terbinafine oral granules What is this medicine? TERBINAFINE (TER bin a feen) is an antifungal medicine. It is used to treat certain kinds of fungal or yeast infections. This medicine may be used for other purposes; ask your health care provider or pharmacist if you have questions. COMMON BRAND NAME(S): Lamisil What should I tell my health care provider before I take this medicine?  They need to know if you have any of these conditions: -drink alcoholic beverages -kidney disease -liver disease -an unusual or allergic reaction to Terbinafine, other medicines, foods, dyes, or preservatives -pregnant or trying to get  pregnant -breast-feeding How should I use this medicine? Take this medicine by mouth. Follow the directions on the prescription label. Hold packet with cut line on top. Shake packet gently to settle contents. Tear packet open along cut line, or use scissors to cut across line. Carefully pour the entire contents of packet onto a spoonful of a soft food, such as pudding or other soft, non-acidic food such as mashed potatoes (do NOT use applesauce or a fruit-based food). If two packets are required for each dose, you may either sprinkle the content of both packets on one spoonful of non-acidic food, or sprinkle the contents of both packets on two spoonfuls of non-acidic food. Make sure that no granules remain in the packet. Swallow the mxiture of the food and granules without chewing. Take your medicine at regular intervals. Do not take it more often than directed. Take all of your medicine as directed even if you think you are better. Do not skip doses or stop your medicine early. Contact your pediatrician or health care professional regarding the use of this medicine in children. While this medicine may be prescribed for children as young as 4 years for selected conditions, precautions do apply. Overdosage: If you think you have taken too much of this medicine contact a poison control center or emergency room at once. NOTE: This medicine is only for you. Do not share this medicine with others. What if I miss a dose? If you miss a dose, take it as soon as you can. If it is almost time for your next dose, take only that dose. Do not take double or extra doses. What may interact with this medicine? Do not take this medicine with any of the following medications: -thioridazine This medicine may also interact with the following medications: -beta-blockers -caffeine -cimetidine -cyclosporine -MAOIs like Carbex, Eldepryl, Marplan, Nardil, and Parnate -medicines for fungal infections like fluconazole and  ketoconazole -medicines for irregular heartbeat like amiodarone, flecainide and propafenone -rifampin -SSRIs like citalopram, escitalopram, fluoxetine, fluvoxamine, paroxetine and sertraline -tricyclic antidepressants like amitriptyline, clomipramine, desipramine, imipramine, nortriptyline, and others -warfarin This list may not describe all possible interactions. Give your health care provider a list of all the medicines, herbs, non-prescription drugs, or dietary supplements you use. Also tell them if you smoke, drink alcohol, or use illegal drugs. Some items may interact with your medicine. What should I watch for while using this medicine? Your doctor may monitor your liver function. Tell your doctor right away if you have nausea or vomiting, loss of appetite, stomach pain on your right upper side, yellow skin, dark urine, light stools, or are over tired. You need to take this medicine for 6 weeks or longer to cure the fungal infection. Take your medicine regularly for as long as your doctor or health care professional tells you to. What side effects may I notice from receiving this medicine? Side effects that you should report to your doctor or health care professional as soon as possible: -allergic reactions like skin rash or hives, swelling of the face, lips, or tongue -change in vision -dark urine -fever or infection -general ill feeling or flu-like symptoms -light-colored stools -loss of appetite, nausea -redness, blistering, peeling or loosening of the skin, including inside the mouth -right upper belly pain -unusually weak  or tired -yellowing of the eyes or skin Side effects that usually do not require medical attention (report to your doctor or health care professional if they continue or are bothersome): -changes in taste -diarrhea -hair loss -muscle or joint pain -stomach upset This list may not describe all possible side effects. Call your doctor for medical advice about  side effects. You may report side effects to FDA at 1-800-FDA-1088. Where should I keep my medicine? Keep out of the reach of children. Store at room temperature between 15 and 30 degrees C (59 and 86 degrees F). Throw away any unused medicine after the expiration date. NOTE: This sheet is a summary. It may not cover all possible information. If you have questions about this medicine, talk to your doctor, pharmacist, or health care provider.  2018 Elsevier/Gold Standard (2007-03-25 17:25:48)

## 2017-07-13 NOTE — Progress Notes (Signed)
Subjective:   Patient ID: Alexa [name], female   DOB: 57 y.o.   MRN: [id number]   HPI 57 year old female presents the office today for multiple concerns.  She states that she is having the start of hammertoes as well as a bunion and she also has a history of plantar fasciitis.  She is asking for inserts to wear inside of her shoes to help prevent issues and she wants to discuss other options to help prevent her chance of skin breakdown.  She also concerned that she is making some athlete's foot as well as for some thickened discolored toenails.  She denies any pain in the nails and she denies any redness or drainage or any swelling.  She had no recent treatment.  She has no other concerns.   Review of Systems  All other systems reviewed and are negative.  Past Medical History:  Diagnosis Date  . Anemia, iron deficiency   . Asthma   . Asthma, chronic   . CAD (coronary artery disease)   . Chronic insomnia   . Combined hyperlipidemia   . Depression   . Diabetes mellitus   . Diabetes mellitus type II   . Diabetic autonomic neuropathy ([place])   . DM type 2 with diabetic peripheral neuropathy ([place])   . Dyspepsia   . Elevated homocysteine ([place])   . Essential tremor   . Fatigue   . GERD (gastroesophageal reflux disease)   . GERD (gastroesophageal reflux disease)   . Glossitis   . Goiter   . Hyperparathyroidism , secondary, non-renal ([place])   . Hypertension   . Nonproliferative diabetic retinopathy associated with type 2 diabetes mellitus ([place])    RESOLVED  . Obesity, Class III, BMI 40-49.9 (morbid obesity) ([place])   . Pallor   . Tachycardia   . Thyroiditis, autoimmune   . Vitamin D deficiency     Past Surgical History:  Procedure Laterality Date  . CARDIAC CATHETERIZATION     x 2  . ROUX-EN-Y PROCEDURE    . TONSILLECTOMY       Current Outpatient Medications:  .  albuterol (PROVENTIL HFA;VENTOLIN HFA) 108 (90 Base) MCG/ACT inhaler, Inhale 2 puffs into the lungs every 6 (six)  hours as needed for wheezing or shortness of breath., Disp: 1 Inhaler, Rfl: 11 .  albuterol (PROVENTIL) (2.5 MG/3ML) 0.083% nebulizer solution, Take 3 mLs (2.5 mg total) by nebulization every 6 (six) hours as needed for wheezing or shortness of breath., Disp: 150 mL, Rfl: 1 .  aspirin EC 81 MG EC tablet, Take 1 tablet (81 mg total) by mouth daily. (Patient not taking: Reported on 04/12/2017), Disp: 30 tablet, Rfl: 0 .  Calcium Carbonate-Vitamin D (CALCIUM-VITAMIN D) 500-200 MG-UNIT per tablet, Take 1 tablet by mouth 2 (two) times daily with a meal. Reported on 02/26/2015, Disp: , Rfl:  .  clonazePAM (KLONOPIN) 0.5 MG tablet, Take 1 tablet (0.5 mg total) by mouth 2 (two) times daily as needed for anxiety., Disp: 30 tablet, Rfl: 1 .  conjugated estrogens (PREMARIN) vaginal cream, Place 1 Applicatorful vaginally daily. (Patient taking differently: Place 1 Applicatorful vaginally daily as needed (for dryness). ), Disp: 42.5 g, Rfl: 11 .  COREG 3.125 MG tablet, Take 1 tablet (3.125 mg total) by mouth 2 (two) times daily with a meal., Disp: 60 tablet, Rfl: 11 .  cyclobenzaprine (FLEXERIL) 10 MG tablet, Take 1 tablet (10 mg total) by mouth 3 (three) times daily as needed for muscle spasms., Disp: 90 tablet, Rfl: 0 .  diphenhydrAMINE (BENADRYL) 25 MG tablet, Take 25-50 mg by mouth 2 (two) times daily as needed for allergies or sleep., Disp: , Rfl:  .  escitalopram (LEXAPRO) 20 MG tablet, Take 1 tablet (20 mg total) by mouth daily., Disp: 90 tablet, Rfl: 1 .  Ferrous Sulfate (SLOW FE PO), Take 1 tablet by mouth 3 (three) times a week., Disp: , Rfl:  .  levothyroxine (SYNTHROID, LEVOTHROID) 25 MCG tablet, Take one tablet daily., Disp: 90 tablet, Rfl: 1 .  metFORMIN (GLUCOPHAGE) 1000 MG tablet, Take 1 tablet (1,000 mg total) by mouth 2 (two) times daily with a meal., Disp: 180 tablet, Rfl: 1 .  Multiple Vitamin (MULTIVITAMIN) tablet, Take 1 tablet by mouth daily. Reported on 02/26/2015, Disp: 30 tablet, Rfl: 0 .   Naltrexone-buPROPion HCl ER (CONTRAVE) 8-90 MG TB12, Take 2 tablets by mouth 2 (two) times daily., Disp: , Rfl:  .  ondansetron (ZOFRAN) 4 MG tablet, Take 1 tablet (4 mg total) by mouth every 8 (eight) hours as needed for nausea or vomiting., Disp: 20 tablet, Rfl: 0 .  pantoprazole (PROTONIX) 40 MG tablet, TAKE 1 TABLET (40 MG TOTAL) BY MOUTH DAILY., Disp: 90 tablet, Rfl: 4 .  telmisartan (MICARDIS) 40 MG tablet, TAKE 1 TABLET BY MOUTH EACH MORNING. (Patient taking differently: Take 40 mg by mouth daily. ), Disp: 90 tablet, Rfl: 3 .  traMADol (ULTRAM) 50 MG tablet, Take 1 tablet (50 mg total) by mouth every 8 (eight) hours as needed. (Patient taking differently: Take 50 mg by mouth every 8 (eight) hours as needed (for pain). ), Disp: 90 tablet, Rfl: 1 .  VICTOZA 18 MG/3ML SOPN, INJECT 1.8 MG DAILY, Disp: 27 mL, Rfl: 1  Allergies  Allergen Reactions  . Theophyllines Palpitations          Objective:  Physical Exam  General: AAO x3, NAD  Dermatological: Nails appear to be hypertrophic, dystrophic with yellow-brown discoloration.  There is no pain in the nails there is no surrounding redness or drainage or any signs of infection.  There is mild tinea pedis present interdigitally there is no skin breakdown, pustules or drainage or pus.  No erythema otherwise.   Vascular: Dorsalis Pedis artery and Posterior Tibial artery pedal pulses are 2/4 bilateral with immedate capillary fill time. There is no pain with calf compression, swelling, warmth, erythema.   Neruologic: Grossly intact via light touch bilateral. Vibratory intact via tuning fork bilateral. Protective threshold with Semmes Wienstein monofilament intact to all pedal sites bilateral.   Musculoskeletal: Mild HAV is present as well as mild flexible hammertoe contractures without any discomfort.  There is no area pinpoint tenderness greater find there is no pain on the plantar medial tubercle of the calcaneus at the insertion of plantar  fascia there is no other areas of tenderness identified at this time.  No edema, erythema, increase in warmth.  Muscular strength 5/5 in all groups tested bilateral.  Gait: Unassisted, Nonantalgic.       Assessment:   57 year old female presents the office with multiple concerns.     Plan:  -Treatment options discussed including all alternatives, risks, and complications -Etiology of symptoms were discussed -X-rays were obtained and reviewed with the patient.  Minimal hammertoe and HAV is present.  No evidence of acute fracture. -Regards to the bunions, hammertoes we discussed different stretching exercises for the toes to help strengthen the toes.  Also discussed the change in shoes we also discussed orthotics and she wants to proceed with these.  Liliane Channel  to get molded for orthotics today.  Given her history of plantar fasciitis as well as start to the bunion hammertoes I do think she will benefit from more support. -Discussed treatment options for nail fungus.  I did debride the nails today as this will culture to [facility].  We will check a CBC and LFT for likely start of Lamisil pending culture results. -Discussed the importance of daily foot inspection.  Trula Slade DPM

## 2017-07-27 ENCOUNTER — Telehealth: Payer: Self-pay | Admitting: Podiatry

## 2017-07-27 ENCOUNTER — Telehealth: Payer: Self-pay | Admitting: *Deleted

## 2017-07-27 NOTE — Telephone Encounter (Signed)
Left message informing pt I would have the [place] office call with the lab information.

## 2017-07-27 NOTE — Telephone Encounter (Signed)
Patient would like to know where she's suppose to go for blood work. Please call patient back at [phone]

## 2017-07-27 NOTE — Telephone Encounter (Signed)
Left message per DPR that the lab work Dr Jacqualyn Posey ordered was for [facility] located at [address].  I apologized for the confusion but if she had any additional questions to please call the office back.

## 2017-08-09 ENCOUNTER — Other Ambulatory Visit: Payer: 59

## 2017-08-09 ENCOUNTER — Ambulatory Visit: Payer: 59 | Admitting: Orthotics

## 2017-08-09 ENCOUNTER — Ambulatory Visit
Admission: RE | Admit: 2017-08-09 | Discharge: 2017-08-09 | Disposition: A | Discharge status: Home / Self Care | Payer: 59 | Source: Ambulatory Visit | Attending: "Endocrinology | Admitting: "Endocrinology

## 2017-08-09 DIAGNOSIS — E211 Secondary hyperparathyroidism, not elsewhere classified: Secondary | ICD-10-CM

## 2017-08-09 DIAGNOSIS — Z1231 Encounter for screening mammogram for malignant neoplasm of breast: Secondary | ICD-10-CM | POA: Diagnosis not present

## 2017-08-09 DIAGNOSIS — Z139 Encounter for screening, unspecified: Secondary | ICD-10-CM

## 2017-08-09 DIAGNOSIS — M722 Plantar fascial fibromatosis: Secondary | ICD-10-CM

## 2017-08-09 DIAGNOSIS — M8588 Other specified disorders of bone density and structure, other site: Secondary | ICD-10-CM | POA: Diagnosis not present

## 2017-08-09 DIAGNOSIS — M810 Age-related osteoporosis without current pathological fracture: Secondary | ICD-10-CM | POA: Diagnosis not present

## 2017-08-09 DIAGNOSIS — F39 Unspecified mood [affective] disorder: Secondary | ICD-10-CM | POA: Diagnosis not present

## 2017-08-09 DIAGNOSIS — Z780 Asymptomatic menopausal state: Secondary | ICD-10-CM | POA: Diagnosis not present

## 2017-08-09 DIAGNOSIS — Z79899 Other long term (current) drug therapy: Secondary | ICD-10-CM | POA: Diagnosis not present

## 2017-08-09 LAB — CBC WITH DIFFERENTIAL/PLATELET
BASOS PCT: 0.7 %
Basophils Absolute: 52 cells/uL (ref 0–200)
EOS ABS: 111 {cells}/uL (ref 15–500)
Eosinophils Relative: 1.5 %
HCT: 34.7 % — ABNORMAL LOW (ref 35.0–45.0)
HEMOGLOBIN: 11.7 g/dL (ref 11.7–15.5)
Lymphs Abs: 2871 cells/uL (ref 850–3900)
MCH: 29.8 pg (ref 27.0–33.0)
MCHC: 33.7 g/dL (ref 32.0–36.0)
MCV: 88.3 fL (ref 80.0–100.0)
MONOS PCT: 6.8 %
MPV: 10 fL (ref 7.5–12.5)
NEUTROS ABS: 3863 {cells}/uL (ref 1500–7800)
Neutrophils Relative %: 52.2 %
PLATELETS: 340 10*3/uL (ref 140–400)
RBC: 3.93 10*6/uL (ref 3.80–5.10)
RDW: 12.9 % (ref 11.0–15.0)
Total Lymphocyte: 38.8 %
WBC mixed population: 503 cells/uL (ref 200–950)
WBC: 7.4 10*3/uL (ref 3.8–10.8)

## 2017-08-09 LAB — HEPATIC FUNCTION PANEL
AG RATIO: 2 (calc) (ref 1.0–2.5)
ALKALINE PHOSPHATASE (APISO): 65 U/L (ref 33–130)
ALT: 14 U/L (ref 6–29)
AST: 15 U/L (ref 10–35)
Albumin: 4.1 g/dL (ref 3.6–5.1)
BILIRUBIN TOTAL: 0.4 mg/dL (ref 0.2–1.2)
Bilirubin, Direct: 0.1 mg/dL (ref 0.0–0.2)
Globulin: 2.1 g/dL (calc) (ref 1.9–3.7)
Indirect Bilirubin: 0.3 mg/dL (calc) (ref 0.2–1.2)
Total Protein: 6.2 g/dL (ref 6.1–8.1)

## 2017-08-09 MED FILL — CONTRAVE ER 8-90 MG TABLET: 8-90 | 30 days supply | Qty: 120 | Fill #3

## 2017-08-09 MED FILL — CARVEDILOL 3.125 MG TABLET: 3.125 | 30 days supply | Qty: 60 | Fill #3

## 2017-08-09 NOTE — Progress Notes (Signed)
Patient came in today to pick up custom made foot orthotics.  The goals were accomplished and the patient reported no dissatisfaction with said orthotics.  Patient was advised of breakin period and how to report any issues. 

## 2017-08-11 ENCOUNTER — Telehealth: Payer: Self-pay | Admitting: *Deleted

## 2017-08-11 MED ORDER — TERBINAFINE HCL 250 MG PO TABS: 250.0000 mg | ORAL_TABLET | Freq: Every day | ORAL | 0 refills | Status: DC

## 2017-08-11 NOTE — Telephone Encounter (Signed)
Left message informing pt of Dr. Wagoner's review of results and orders. 

## 2017-08-11 NOTE — Telephone Encounter (Signed)
-----   Message from Trula Slade, DPM sent at 08/11/2017  1:37 PM EDT ----- Please let her know that her lab work is normal and can start Lamisil. Thanks.

## 2017-08-11 NOTE — Addendum Note (Signed)
Addended by: Harriett Sine D on: 08/11/2017 05:08 PM   Modules accepted: Orders

## 2017-08-11 NOTE — Telephone Encounter (Signed)
I was notified by the nurse today that I can start taking the medication for my toenail fungus and athletes foot as he received my lab results back. I did not get a prescription and I called [facility] and they said they do not have a prescription from Dr. Jacqualyn Posey for that medication. I need that prescription either called in or e-scribed it at all possible. My number is [phone]. I was never given the prescription. Thank you so much. Bye.

## 2017-08-11 NOTE — Telephone Encounter (Signed)
Left message informing pt the medication had been called to the pharmacy. °

## 2017-08-12 DIAGNOSIS — M549 Dorsalgia, unspecified: Secondary | ICD-10-CM | POA: Diagnosis not present

## 2017-08-12 DIAGNOSIS — N62 Hypertrophy of breast: Secondary | ICD-10-CM | POA: Diagnosis not present

## 2017-08-12 DIAGNOSIS — G8929 Other chronic pain: Secondary | ICD-10-CM | POA: Diagnosis not present

## 2017-08-12 DIAGNOSIS — Z9884 Bariatric surgery status: Secondary | ICD-10-CM | POA: Diagnosis not present

## 2017-08-12 DIAGNOSIS — L304 Erythema intertrigo: Secondary | ICD-10-CM | POA: Diagnosis not present

## 2017-08-12 DIAGNOSIS — M793 Panniculitis, unspecified: Secondary | ICD-10-CM | POA: Diagnosis not present

## 2017-08-12 DIAGNOSIS — E113299 Type 2 diabetes mellitus with mild nonproliferative diabetic retinopathy without macular edema, unspecified eye: Secondary | ICD-10-CM | POA: Insufficient documentation

## 2017-08-12 DIAGNOSIS — M542 Cervicalgia: Secondary | ICD-10-CM | POA: Diagnosis not present

## 2017-08-12 MED FILL — TERBINAFINE HCL 250 MG TAB: 250 | 90 days supply | Qty: 90 | Fill #0

## 2017-08-15 NOTE — Progress Notes (Signed)
Erroneous

## 2017-08-16 ENCOUNTER — Telehealth (INDEPENDENT_AMBULATORY_CARE_PROVIDER_SITE_OTHER): Payer: Self-pay | Admitting: "Endocrinology

## 2017-08-16 NOTE — Telephone Encounter (Signed)
[place] called to ask how much calcium she should be taking now. She currently takes 1200 mg/day. 2. I reviewed her last lab results from May. Her calcium was normal at 9.7, but I did want to increase the value to about 10.0.  3. I asked Lamis to increase the dose to 1500 mg/day.  Tillman Sers, MD, CDE

## 2017-09-06 MED FILL — LEVOTHYROXINE 25 MCG TABLET: 25 | 90 days supply | Qty: 90 | Fill #1

## 2017-09-06 MED FILL — CARVEDILOL 3.125 MG TABLET: 3.125 | 30 days supply | Qty: 60 | Fill #4

## 2017-09-06 MED FILL — ESCITALOPRAM 20 MG TABLET: 20 | 90 days supply | Qty: 90 | Fill #1

## 2017-09-06 MED FILL — PANTOPRAZOLE SOD DR 40 MG T: 40 | 90 days supply | Qty: 90 | Fill #1

## 2017-09-06 MED FILL — metFORMIN HCL 1000 MG TABS: 1000 | 90 days supply | Qty: 180 | Fill #1

## 2017-09-06 MED FILL — VICTOZA 18 MG/3 ML INJECT P: 18 | 90 days supply | Qty: 27 | Fill #1

## 2017-09-06 MED FILL — TELMISARTAN 40 MG TABLET: 40 | 90 days supply | Qty: 90 | Fill #2

## 2017-09-13 ENCOUNTER — Encounter: Payer: Self-pay | Admitting: Podiatry

## 2017-09-13 ENCOUNTER — Ambulatory Visit (INDEPENDENT_AMBULATORY_CARE_PROVIDER_SITE_OTHER): Payer: 59 | Admitting: Podiatry

## 2017-09-13 ENCOUNTER — Encounter: Payer: Self-pay | Admitting: "Endocrinology

## 2017-09-13 DIAGNOSIS — Z79899 Other long term (current) drug therapy: Secondary | ICD-10-CM

## 2017-09-13 DIAGNOSIS — F39 Unspecified mood [affective] disorder: Secondary | ICD-10-CM | POA: Diagnosis not present

## 2017-09-13 DIAGNOSIS — H524 Presbyopia: Secondary | ICD-10-CM | POA: Diagnosis not present

## 2017-09-13 DIAGNOSIS — B351 Tinea unguium: Secondary | ICD-10-CM | POA: Diagnosis not present

## 2017-09-13 LAB — HM DIABETES EYE EXAM

## 2017-09-13 MED FILL — VENTOLIN HFA 90 MCG INHALER: 108 (90 BAS | 25 days supply | Qty: 18 | Fill #0

## 2017-09-13 MED FILL — ALBUTEROL 0.083% INHAL SOLN: (2.5 MG/3ML | 30 days supply | Qty: 150 | Fill #0

## 2017-09-13 NOTE — Patient Instructions (Signed)

## 2017-09-14 ENCOUNTER — Telehealth: Payer: Self-pay | Admitting: *Deleted

## 2017-09-14 DIAGNOSIS — B351 Tinea unguium: Secondary | ICD-10-CM | POA: Insufficient documentation

## 2017-09-14 LAB — CBC WITH DIFFERENTIAL/PLATELET
Basophils Absolute: 49 cells/uL (ref 0–200)
Basophils Relative: 0.9 %
EOS PCT: 2.8 %
Eosinophils Absolute: 151 cells/uL (ref 15–500)
HEMATOCRIT: 36.4 % (ref 35.0–45.0)
HEMOGLOBIN: 12.3 g/dL (ref 11.7–15.5)
LYMPHS ABS: 1966 {cells}/uL (ref 850–3900)
MCH: 29.5 pg (ref 27.0–33.0)
MCHC: 33.8 g/dL (ref 32.0–36.0)
MCV: 87.3 fL (ref 80.0–100.0)
MPV: 9.8 fL (ref 7.5–12.5)
Monocytes Relative: 8 %
NEUTROS ABS: 2803 {cells}/uL (ref 1500–7800)
NEUTROS PCT: 51.9 %
Platelets: 365 10*3/uL (ref 140–400)
RBC: 4.17 10*6/uL (ref 3.80–5.10)
RDW: 12.2 % (ref 11.0–15.0)
Total Lymphocyte: 36.4 %
WBC: 5.4 10*3/uL (ref 3.8–10.8)
WBCMIX: 432 {cells}/uL (ref 200–950)

## 2017-09-14 LAB — HEPATIC FUNCTION PANEL
AG Ratio: 2 (calc) (ref 1.0–2.5)
ALT: 12 U/L (ref 6–29)
AST: 15 U/L (ref 10–35)
Albumin: 4.3 g/dL (ref 3.6–5.1)
Alkaline phosphatase (APISO): 56 U/L (ref 33–130)
BILIRUBIN DIRECT: 0.1 mg/dL (ref 0.0–0.2)
BILIRUBIN INDIRECT: 0.3 mg/dL (ref 0.2–1.2)
BILIRUBIN TOTAL: 0.4 mg/dL (ref 0.2–1.2)
GLOBULIN: 2.1 g/dL (ref 1.9–3.7)
Total Protein: 6.4 g/dL (ref 6.1–8.1)

## 2017-09-14 NOTE — Progress Notes (Signed)
Subjective: 57 year old female presents the office today for follow-up evaluation after starting Lamisil.  Is not for 4 weeks now with any issues she denies any side effects.  She states that she is actually starting to see some clearing of the toenails since starting the medication.  She denies any pain in the nails and she denies any redness or drainage or any swelling she has no other concerns. Denies any systemic complaints such as fevers, chills, nausea, vomiting. No acute changes since last appointment, and no other complaints at this time.   Objective: AAO x3, NAD DP/PT pulses palpable bilaterally, CRT less than 3 seconds Overall the toenails appear to be still hypertrophic, dystrophic with yellow-brown discoloration but there is some clearing on the proximal nail corners.  There is no pain in the nails there is no surrounding redness or drainage or any signs of infection present. No open lesions or pre-ulcerative lesions.  No pain with calf compression, swelling, warmth, erythema  Assessment: Onychomycosis  Plan: -All treatment options discussed with the patient including all alternatives, risks, complications.  -Overall the toenails are getting better after being on Lamisil.  We will continue this medicine for a total of 90 days going to recheck a CBC and LFT and lab work was printed for her today.  Continue to monitor any side effects and let me know if she has any issues.  She agrees to this plan she has no further questions or concerns. -Patient encouraged to call the office with any questions, concerns, change in symptoms.   Trula Slade DPM

## 2017-09-14 NOTE — Telephone Encounter (Signed)
I informed pt of Dr. Leigh Aurora review of results and orders.

## 2017-09-14 NOTE — Telephone Encounter (Signed)
-----   Message from Trula Slade, DPM sent at 09/14/2017  7:06 AM EDT ----- Val- please let her know that her blood work is normal and can continue lamisil. Follow-up in 2 months.

## 2017-09-17 NOTE — Progress Notes (Signed)
Cardiology Office Note   Date:  09/20/2017   ID:  Alexa [name], DOB 12-03-1960, MRN [id number]  PCP:  Elby Showers, MD    No chief complaint on file.  HTN  Wt Readings from Last 3 Encounters:  09/20/17 173 lb 6.4 oz (78.7 kg)  06/14/17 178 lb 6.4 oz (80.9 kg)  04/12/17 192 lb 9.6 oz (87.4 kg)       History of Present Illness: Alexa [name] is a 57 y.o. female  with a hx of medication non-compliance, non-obstructive CAD per cath 2003 and subsequent cath in 2007 with minor obstructive disease (20-30% narrowing of the LAD ostially and 30-40% mid LAD stenosis), uncontrolled HTN, uncontrolled DM II, HLD, obesity, peripheral neuropathy, GERD, asthma, and hypothyroidism   ER visit in 2/19: "In the ED, her troponin levels have been negative x 2 (<0.03, <0.03). Her EKG unremarkable with BBB, no evidence of acute ischemia. A CXR was performed that showed no active cardiopulmonary disease. Her BP's were and have been markedly elevated since admission, 180/108>184/104>167/99. "  Denies : Dizziness. Leg edema. Nitroglycerin use. Orthopnea. Palpitations. Paroxysmal nocturnal dyspnea. Shortness of breath. Syncope.  Rare chest pain with anxiety.  Not related to exertion.  She walks occasionally.    She thinks she could avoid salt better.   She feels that her legs are discolored somewhat.  Occasional leg cramps.  She works as a Writer in the hospital.   She still gets high BP readings at home.  On occasion, it has been well controlled.  She has been taking a weight loss drug called Contrave.  I looked up the side effects which include high blood pressure and dry mouth.  She reports both.  Past Medical History:  Diagnosis Date  . Anemia, iron deficiency   . Asthma   . Asthma, chronic   . CAD (coronary artery disease)   . Chronic insomnia   . Combined hyperlipidemia   . Depression   . Diabetes mellitus   . Diabetes mellitus type II   . Diabetic autonomic neuropathy ([place])    . DM type 2 with diabetic peripheral neuropathy ([place])   . Dyspepsia   . Elevated homocysteine ([place])   . Essential tremor   . Fatigue   . GERD (gastroesophageal reflux disease)   . GERD (gastroesophageal reflux disease)   . Glossitis   . Goiter   . Hyperparathyroidism , secondary, non-renal ([place])   . Hypertension   . Nonproliferative diabetic retinopathy associated with type 2 diabetes mellitus ([place])    RESOLVED  . Obesity, Class III, BMI 40-49.9 (morbid obesity) ([place])   . Pallor   . Tachycardia   . Thyroiditis, autoimmune   . Vitamin D deficiency     Past Surgical History:  Procedure Laterality Date  . CARDIAC CATHETERIZATION     x 2  . ROUX-EN-Y PROCEDURE    . TONSILLECTOMY       Current Outpatient Medications  Medication Sig Dispense Refill  . albuterol (PROVENTIL HFA;VENTOLIN HFA) 108 (90 Base) MCG/ACT inhaler Inhale 2 puffs into the lungs every 6 (six) hours as needed for wheezing or shortness of breath. 1 Inhaler 11  . albuterol (PROVENTIL) (2.5 MG/3ML) 0.083% nebulizer solution Take 3 mLs (2.5 mg total) by nebulization every 6 (six) hours as needed for wheezing or shortness of breath. 150 mL 1  . aspirin EC 81 MG EC tablet Take 1 tablet (81 mg total) by mouth daily. 30 tablet 0  . CALCIUM-VITAMIN  D PO Take 800 mg by mouth 2 (two) times daily with a meal. Reported on 02/26/2015    . clonazePAM (KLONOPIN) 0.5 MG tablet Take 1 tablet (0.5 mg total) by mouth 2 (two) times daily as needed for anxiety. 30 tablet 1  . COREG 3.125 MG tablet Take 1 tablet (3.125 mg total) by mouth 2 (two) times daily with a meal. 60 tablet 11  . cyclobenzaprine (FLEXERIL) 10 MG tablet Take 1 tablet (10 mg total) by mouth 3 (three) times daily as needed for muscle spasms. 90 tablet 0  . diphenhydrAMINE (BENADRYL) 25 MG tablet Take 25-50 mg by mouth 2 (two) times daily as needed for allergies or sleep.    Marland Kitchen escitalopram (LEXAPRO) 20 MG tablet Take 1 tablet (20 mg total) by mouth daily. 90 tablet 1   . estrogens, conjugated, (PREMARIN) 0.625 MG tablet Take 0.625 mg by mouth as needed (vaginal dryness). Take daily for 21 days then do not take for 7 days.    . Ferrous Sulfate (SLOW FE PO) Take 1 tablet by mouth 3 (three) times a week.    . levothyroxine (SYNTHROID, LEVOTHROID) 25 MCG tablet Take one tablet daily. 90 tablet 1  . metFORMIN (GLUCOPHAGE) 1000 MG tablet Take 1 tablet (1,000 mg total) by mouth 2 (two) times daily with a meal. 180 tablet 1  . Multiple Vitamin (MULTIVITAMIN) tablet Take 1 tablet by mouth daily. Reported on 02/26/2015 30 tablet 0  . Naltrexone-buPROPion HCl ER (CONTRAVE) 8-90 MG TB12 Take 2 tablets by mouth 2 (two) times daily.    . ondansetron (ZOFRAN) 4 MG tablet Take 1 tablet (4 mg total) by mouth every 8 (eight) hours as needed for nausea or vomiting. 20 tablet 0  . pantoprazole (PROTONIX) 40 MG tablet TAKE 1 TABLET (40 MG TOTAL) BY MOUTH DAILY. 90 tablet 4  . telmisartan (MICARDIS) 40 MG tablet TAKE 1 TABLET BY MOUTH EACH MORNING. (Patient taking differently: Take 40 mg by mouth daily. ) 90 tablet 3  . terbinafine (LAMISIL) 250 MG tablet Take 1 tablet (250 mg total) by mouth daily. 90 tablet 0  . traMADol (ULTRAM) 50 MG tablet Take 50 mg by mouth every 6 (six) hours as needed for moderate pain or severe pain.    Marland Kitchen VICTOZA 18 MG/3ML SOPN INJECT 1.8 MG DAILY 27 mL 1   No current facility-administered medications for this visit.     Allergies:   Other and Theophyllines    Social History:  The patient  reports that she has never smoked. She has never used smokeless tobacco. She reports that she does not drink alcohol or use drugs.   Family History:  The patient's family history includes Colon cancer in her other; Diabetes in her father and maternal grandmother; Liver disease in her maternal aunt; Obesity in her brother; Thyroid disease in her mother and sister.    ROS:  Please see the history of present illness.   Otherwise, review of systems are positive for  weight loss.   All other systems are reviewed and negative.    PHYSICAL EXAM: VS:  BP 120/80 (BP Location: Right Arm, Patient Position: Sitting, Cuff Size: Normal)   Pulse 92   Ht 5' 3.5" (1.613 m)   Wt 173 lb 6.4 oz (78.7 kg)   LMP 05/11/2010   SpO2 99%   BMI 30.23 kg/m  , BMI Body mass index is 30.23 kg/m. GEN: Well nourished, well developed, in no acute distress  HEENT: normal  Neck: no JVD,  carotid bruits, or masses Cardiac: RRR; no murmurs, rubs, or gallops,no edema  Respiratory:  clear to auscultation bilaterally, normal work of breathing GI: soft, nontender, nondistended, + BS MS: no deformity or atrophy  Skin: warm and dry, no rash Neuro:  Strength and sensation are intact Psych: euthymic mood, full affect    Recent Labs: 03/06/2017: Magnesium 2.0 06/08/2017: BUN 20; Creat 1.27; Potassium 4.7; Sodium 135; TSH 1.80 09/13/2017: ALT 12; Hemoglobin 12.3; Platelets 365   Lipid Panel    Component Value Date/Time   CHOL 188 06/08/2017 0000   TRIG 110 06/08/2017 0000   HDL 67 06/08/2017 0000   CHOLHDL 2.8 06/08/2017 0000   VLDL 10 03/05/2017 1903   LDLCALC 100 (H) 06/08/2017 0000     Other studies Reviewed: Additional studies/ records that were reviewed today with results demonstrating: Hospital records reviewed.   ASSESSMENT AND PLAN:  1. HTN : Continue carvedilol.  Change Micardis to 20 mg twice daily.  This may help with morning blood pressures.  Would also have her wean off of Contrave.  She will check her blood pressure at home and let us know if the readings are high.  If she continued to have high blood pressure even after weaning off of the weight loss supplement, would uptitrate her carvedilol.  Readings controlled today in the office. 2. DM2: Continue regular exercise and dietary changes to help with blood sugar control.  I encouraged her to try to get at least a 30 to 40-minute continuous walk 3 days a week.  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  She has lost significant weight.  3. Hypothyroid: Continue thyroid supplementation.   Current medicines are reviewed at length with the patient today.  The patient concerns regarding her medicines were addressed.  The following changes have been made:  No change  Labs/ tests ordered today include:  No orders of the defined types were placed in this encounter.   Recommend 150 minutes/week of aerobic exercise Low fat, low carb, high fiber diet recommended  Disposition:   FU in 6 months   Signed, Larae Grooms, MD  09/20/2017 10:24 AM    [facility] [place], [place], [place]  [address] Phone: [phone]; Fax: [phone]

## 2017-09-20 ENCOUNTER — Encounter

## 2017-09-20 ENCOUNTER — Encounter: Payer: Self-pay | Admitting: Interventional Cardiology

## 2017-09-20 ENCOUNTER — Ambulatory Visit (INDEPENDENT_AMBULATORY_CARE_PROVIDER_SITE_OTHER): Payer: 59 | Admitting: Interventional Cardiology

## 2017-09-20 VITALS — BP 120/80 | HR 92 | Ht 63.5 in | Wt 173.4 lb

## 2017-09-20 DIAGNOSIS — I10 Essential (primary) hypertension: Secondary | ICD-10-CM | POA: Diagnosis not present

## 2017-09-20 DIAGNOSIS — E119 Type 2 diabetes mellitus without complications: Secondary | ICD-10-CM

## 2017-09-20 DIAGNOSIS — R079 Chest pain, unspecified: Secondary | ICD-10-CM

## 2017-09-20 MED ORDER — TELMISARTAN 40 MG PO TABS: 20.0000 mg | ORAL_TABLET | Freq: Two times a day (BID) | ORAL | 3 refills | Status: DC

## 2017-09-20 MED FILL — SM BLOOD PRESSURE MONITOR: 1 days supply | Qty: 1 | Fill #0

## 2017-09-20 NOTE — Patient Instructions (Addendum)
Medication Instructions:  Your physician has recommended you make the following change in your medication:   1. TAKE: telmisartan (micardis) 40 mg tablet: Take 1/2 tablet (20 mg total) twice a day  2. Wean yourself off of contrave as this can cause high blood pressure and dry mouth  Labwork: None ordered  Testing/Procedures: None ordered  Follow-Up: Your physician wants you to follow-up in: 6 months with Dr. Irish Lack. You will receive a reminder letter in the mail two months in advance. If you don't receive a letter, please call our office to schedule the follow-up appointment.   Any Other Special Instructions Will Be Listed Below (If Applicable).     If you need a refill on your cardiac medications before your next appointment, please call your pharmacy.

## 2017-10-01 MED FILL — CEPHALEXIN 500 MG CAPSULE: 500 | 7 days supply | Qty: 28 | Fill #0

## 2017-10-01 MED FILL — HYDROCODON-APAP 5-325: 5-325 | 1 days supply | Qty: 8 | Fill #0

## 2017-10-11 ENCOUNTER — Telehealth (INDEPENDENT_AMBULATORY_CARE_PROVIDER_SITE_OTHER): Payer: Self-pay | Admitting: "Endocrinology

## 2017-10-11 MED FILL — PREMARIN VAGINAL CREAM-APPL: 0.625 | 15 days supply | Qty: 30 | Fill #0

## 2017-10-11 MED FILL — CARVEDILOL 3.125 MG TABLET: 3.125 | 90 days supply | Qty: 180 | Fill #5

## 2017-10-11 MED FILL — VENTOLIN HFA 90 MCG INHALER: 108 (90 BAS | 25 days supply | Qty: 18 | Fill #1

## 2017-10-11 NOTE — Telephone Encounter (Signed)
°  Who's calling (name and relationship to patient) :Self  Best contact number:  Provider they BLT:[id number]  Reason for call:Authorization     PRESCRIPTION REFILL ONLY  Name of prescription:Contrave  Pharmacy:[facility]  They will not refill the refill s she on it until insurance company gets authorization

## 2017-10-12 NOTE — Telephone Encounter (Signed)
Spoke to patient, advised I will send the prior to [place].

## 2017-10-13 ENCOUNTER — Other Ambulatory Visit (INDEPENDENT_AMBULATORY_CARE_PROVIDER_SITE_OTHER): Payer: Self-pay | Admitting: *Deleted

## 2017-10-13 MED ORDER — NALTREXONE-BUPROPION HCL ER 8-90 MG PO TB12: 2.0000 | ORAL_TABLET | Freq: Two times a day (BID) | ORAL | 1 refills | Status: DC

## 2017-10-13 MED FILL — CONTRAVE ER 8-90 MG TABLET: 8-90 | 30 days supply | Qty: 120 | Fill #4

## 2017-10-13 NOTE — Telephone Encounter (Signed)
Ske to patient

## 2017-10-13 NOTE — Telephone Encounter (Signed)
Spoke to patient, advised script approved. Resent to pharmacy.

## 2017-10-25 ENCOUNTER — Ambulatory Visit (INDEPENDENT_AMBULATORY_CARE_PROVIDER_SITE_OTHER): Payer: 59 | Admitting: "Endocrinology

## 2017-10-25 ENCOUNTER — Ambulatory Visit: Payer: 59 | Admitting: Podiatry

## 2017-11-01 ENCOUNTER — Ambulatory Visit (INDEPENDENT_AMBULATORY_CARE_PROVIDER_SITE_OTHER): Payer: 59 | Admitting: "Endocrinology

## 2017-11-01 ENCOUNTER — Encounter: Payer: Self-pay | Admitting: Podiatry

## 2017-11-01 ENCOUNTER — Encounter (INDEPENDENT_AMBULATORY_CARE_PROVIDER_SITE_OTHER): Payer: Self-pay | Admitting: "Endocrinology

## 2017-11-01 ENCOUNTER — Ambulatory Visit (INDEPENDENT_AMBULATORY_CARE_PROVIDER_SITE_OTHER): Payer: 59 | Admitting: Podiatry

## 2017-11-01 VITALS — BP 130/68 | HR 82 | Ht 63.62 in | Wt 172.4 lb

## 2017-11-01 DIAGNOSIS — Z79899 Other long term (current) drug therapy: Secondary | ICD-10-CM

## 2017-11-01 DIAGNOSIS — E11649 Type 2 diabetes mellitus with hypoglycemia without coma: Secondary | ICD-10-CM | POA: Diagnosis not present

## 2017-11-01 DIAGNOSIS — B351 Tinea unguium: Secondary | ICD-10-CM

## 2017-11-01 DIAGNOSIS — E063 Autoimmune thyroiditis: Secondary | ICD-10-CM | POA: Diagnosis not present

## 2017-11-01 DIAGNOSIS — E559 Vitamin D deficiency, unspecified: Secondary | ICD-10-CM | POA: Diagnosis not present

## 2017-11-01 DIAGNOSIS — R000 Tachycardia, unspecified: Secondary | ICD-10-CM | POA: Diagnosis not present

## 2017-11-01 DIAGNOSIS — R4584 Anhedonia: Secondary | ICD-10-CM

## 2017-11-01 DIAGNOSIS — E1143 Type 2 diabetes mellitus with diabetic autonomic (poly)neuropathy: Secondary | ICD-10-CM

## 2017-11-01 DIAGNOSIS — E049 Nontoxic goiter, unspecified: Secondary | ICD-10-CM | POA: Diagnosis not present

## 2017-11-01 DIAGNOSIS — I10 Essential (primary) hypertension: Secondary | ICD-10-CM | POA: Diagnosis not present

## 2017-11-01 DIAGNOSIS — E211 Secondary hyperparathyroidism, not elsewhere classified: Secondary | ICD-10-CM

## 2017-11-01 LAB — POCT GLYCOSYLATED HEMOGLOBIN (HGB A1C): Hemoglobin A1C: 6.9 % — AB (ref 4.0–5.6)

## 2017-11-01 LAB — POCT GLUCOSE (DEVICE FOR HOME USE): POC Glucose: 189 mg/dl — AB (ref 70–99)

## 2017-11-01 NOTE — Patient Instructions (Signed)

## 2017-11-01 NOTE — Patient Instructions (Signed)
Follow up in 2 months. Reduce the metformin dose to 500 mg in the mornings. She will call the [place] to see what their recommendations are about stopping the medication in the perioperative period. She may need to be on Wellbutrin during that time.

## 2017-11-01 NOTE — Progress Notes (Signed)
Subjective:  Patient Name: Alexa [name] Date of Birth: 10-01-1960  MRN: [id number]  Alexa [name]  presents to the office today for follow-up of her type 2 diabetes mellitus, obesity, combined hyperlipidemia, GERD, hypertension, ASHD, dyspepsia, pedal edema, non-proliferative diabetic retinopathy, goiter, depression, autonomic neuropathy, tachycardia, peripheral neuropathy, acquired hypothyroidism, vitamin D deficiency, hypocalcemia, secondary hyperparathyroidism, glossitis, pallor, fatigue, iron deficiency anemia, anhedonia, disinclination to take medicines, and status post Roux-en-Y gastric bypass.   HISTORY OF PRESENT ILLNESS:   Alexa [name] is a 57 y.o. Caucasian woman. Alexa [name] was unaccompanied.  1. The patient first presented to me on 08/20/04 in referral from her primary care internist, Dr. Emeline General, for evaluation and management of type 2 diabetes, obesity, and multiple medical issues. She was 57 years old.   Alexa [name] had a long history of obesity. She was heavy as a child. She underwent menarche at age 26. At age 57 she was 180 pounds. In 1996 she weighed 218 pounds. In 1999 she was diagnosed with type 2 diabetes mellitus. Her weight at that point was 250 pounds. She was treated with Glucophage, and Actos. Actos made her gain even more weight. She had also been treated with glipizide in the past. More recently she had been treated with Lantus and Glucophage plus regular insulin as needed, especially for when she took steroids for asthma attacks. Maximum weight had been 296 pounds one month prior to that first visit with me. Her tendency to gain weight and her difficulty in losing weight were aggravated by long-standing, intermittent depression and by severe, recurrent asthma requiring the use of steroid medications.  B. Past medical history was also positive for a 60% blockage in one of her coronary arteries. She had significant issues with GERD and dyspepsia. She also had combined  hyperlipidemia. She had a previous cardiac catheterization and previous tonsillectomy. She was allergic to theophylline. Her pertinent review of systems was positive for some numbness and tingling in her feet. Family history was positive for type 2 diabetes in her father and her maternal grandmother. Her brother weighed 250 pounds at a height of 76 inches. Both her mother and her sister were hypothyroid.  C. On physical examination, her weight was 279.9 pounds. Her BMI was 49.6. Her blood pressure was 142/86. Her heart rate was 96. Her hemoglobin A1c was 9.8%. She was alert and oriented x3. Her affect was normal. Her insight was fair. She was obviously quite obese. She had a 25 gm thyroid gland. She had 1+ tremor of her hands. She had 1+ DP pulses and 2+ tinea pedis. Sensation to touch was intact in her feet. Laboratory data included a normal CMP. Her cholesterol was 175, triglycerides 76, HDL 53, and LDL 107. Her TSH was 2.98. Since she obviously did have type 2 diabetes mellitus associated with morbid obesity, and since weight loss was a major factor for her, I asked her to resume her metformin twice daily. I also started her on Byetta, initially 5 mcg twice daily and later 10 mcg twice daily. I discontinued her Lantus insulin.  2. During the last 13 years,we have had some successes, some failures, and some new problem areas.  A. T2DM: In October 2007 the combination of Byetta and metformin was causing more gastrointestinal problems. She opted to stop the metformin and continue the Byetta because it was helping her with weight control and blood sugar control. By 08/11/06 her weight had decreased to 267.6 pounds. Hemoglobin A1c was 8.6%. At that point she  decided to have bariatric surgery. She had a Roux-en-Y gastric bypass on 12/20/2006. She subsequently lost weight down to 173.4 pounds on 05/23/08, but then subsequently regained weight to the 190s. Her  hemoglobin A1c values varied in parallel with her weights.  On 05/23/08, at the point of her lowest weight, her hemoglobin A1c dropped to a nadir of 6.3%.  Since then her hemoglobin A1c values have varied between 6.6 and 13.4%. Although we were initially able to stop all of her diabetes medicines after bariatric surgery, when she began to regain weight we restarted metformin, 500 mg twice daily. Although she took her metformin twice daily for a long time, she had discontinued it many years ago. She hated to take medicines and could not make herself be compliant in taking medications or exercising. When her HbA1c increased to 9.4% in September 2015, I stopped her Byetta injections and started her on liraglutide (Victoza) daily injections on 11/16/13. After several more years of noncompliance, she has been doing much better in the past year.   B. Vitamin D deficiency, hypocalcemia, and secondary hyperparathyroidism: Just before her gastric bypass, I obtained baseline bone mineral metabolism studies. Her 25-hydroxy vitamin D was very low at 7 (normal greater than or equal to 30). Her calcium was 9.5 (normal 8.6-10.6). Her parathyroid hormone was 38.4 (normal 14-72). Subsequent to her surgery, the patient was supposed to be taking multivitamins with calcium and vitamin D, but did not always do so. On 03/29/2007 her 25-hydroxy vitamin D was 29, but her calcium decreased to 8.7. Her PTH was slightly elevated at 73.5. Her 1,25-hydroxy vitamin D was 46. Her iron was 56. I asked her to make sure she took her multivitamins and calcium daily. Unfortunately, she has often been non-compliant with her multivitamins and calcium. Her 25-hydroxy vitamin D values have varied between 15-35. Her calcium values have varied between 8.1-9.4. Her PTH values have remained elevated between 79-167. Alexa [name]'s iron levels have also been low, between 32-34. She was supposed to be taking iron every day, but frequently did not do so. In the past year, however, she has been doing better at taking her vitamin  D, calcium, and iron. In May 2019 her vitamin D was at the lower end of the reference range, her calcium was about the 40% of the reference range, but her PTH was still significantly elevated.    Alexa [name] has had psychological issues for many years that have adversely affected her eating, her unwillingness to exercise, her noncompliance with taking medications, her weight, and her T2DM care. Fortunately, her recent outpatient psychological therapy with Ms. Rea College, RN, MS and her taking Lexapro regularly have resulted in Alexa [name] having a much more positive attitude about life in general and a generally more positive attitude about taking her medications. Fortunately, Alexa [name]'s has been dealing successfully with her issues and her compliance has markedly improved.   D. Obesity: Although Alexa [name] regained a great deal of weight in th years following her bariatric surgery, in the past year she has been taking Contrave and has been able to lose almost all of the weight that she had gained after her bariatric surgery.   3. Alexa [name]'s last [facility] visit was on 06/14/17. At that visit she was having frequent low BGs in the mornings, so I asked her to reduce her morning dose of metformin to 500 mg, but continue the evening dose of 1000 mg. I switched her Synthroid dose to bedtime. I asked her to take one [employer], 50,000  IU capsule of vitamin D weekly and calcium at each meal. Unfortunately, she has continued to take the 1000 mg of metformin twice daily.  A. She has been healthy. She is taking all of her medications.   B. She has been eating about the same, but much less since being on the Contrave, 2 pills, twice daily.   Alexa [name] remains in counseling with Ms. Showfety every month.    D. She has not had much of a problem with asthma this year.    E. She has been sleeping "pretty good", except for her husband's snoring. She still has some hot flashes, but much less frequently. She has not had many leg cramps during  the night as long as she drinks enough liquids.    F. She has almost totally stopped drinking Coke. She walks at least twice a week.    G. She is taking Lexapro, 20 mg/day; Synthroid, 25 mcg/day; Micardis, 20 mg, twice daily; Victoza,1.8 mg/day; Protonix, 40 mg/day; metformin 1000 mg, twice daily. She takes one 800 mg Citracal tablet twice daily. She is also taking Coreg, 3.125 mg, twice daily.  H. She is making progress at not allowing herself to become over-committed. She is trying to achieve more balance.   I. She has had some low BG symptom recently, usually in the mornings  J. She is planning to have mammoplasty performed on 12/14/17. She will stay in the hospital overnight.   K. Her podiatrist has started her on Terbinafine (Lamisil) tablets, one daily, for her toenail fungus.   4. Review of Systems: There are no other significant issues  Constitutional: Alexa [name] feels "good".   Eyes: Vision has been okay. She had an eye exam during the Summer. No diabetic eye disease was noted. Neck: She occasionally has trouble swallowing at the level of her mid-neck, especially pills. The patient has no complaints of anterior neck swelling, soreness, tenderness, pressure, or discomfort.  Heart: She saw her cardiologist recently. Her BP was normal. She rarely notes faster heart rates when she is anxious. She has not had any further chest pain or pressure. Heart rate increases with exercise or other physical activity. She has no complaints of palpitations or irregular heat beats Gastrointestinal: She occasionally wakes up nauseous. She has also had some constipation recently. She rarely  has the sensation of discomfort in her LUQ. She has not had any post-prandial bloating recently. She does not have any excessive head hunger or belly hunger, acid reflux, upset stomach, stomach aches or pains, swallowing difficulties, or diarrhea. Legs: She no longer has occasional nocturnal calf cramps. Muscle mass and strength  seem normal. There are no new complaints of numbness, tingling, or burning. No edema is noted. Feet: She has been having some nocturnal foot cramps, but much less frequently and not at all severe. There are no new complaints of numbness, tingling, or burning. No edema is noted. Tinea improves when she uses her anti-fungal cream.  Neuro: No new sensory or muscular problems Psych: She is doing well.    GU: She has not been urinating a lot. She rarely has nocturia.  Hypoglycemia: She has been having some low BG symptoms.   5. BG printout: She did not bring in her BG meter today. She has been checking her BGs 4-5 times per week. Her BG range has been 60-200s. She thinks most BGs have been in the low 100s. Her lower BGs have been in the late mornings. Her higher BGs have all been post-prandial.  PAST MEDICAL, FAMILY, AND SOCIAL HISTORY:  Past Medical History:  Diagnosis Date  . Anemia, iron deficiency   . Asthma   . Asthma, chronic   . CAD (coronary artery disease)   . Chronic insomnia   . Combined hyperlipidemia   . Depression   . Diabetes mellitus   . Diabetes mellitus type II   . Diabetic autonomic neuropathy ([place])   . DM type 2 with diabetic peripheral neuropathy ([place])   . Dyspepsia   . Elevated homocysteine ([place])   . Essential tremor   . Fatigue   . GERD (gastroesophageal reflux disease)   . GERD (gastroesophageal reflux disease)   . Glossitis   . Goiter   . Hyperparathyroidism , secondary, non-renal ([place])   . Hypertension   . Nonproliferative diabetic retinopathy associated with type 2 diabetes mellitus ([place])    RESOLVED  . Obesity, Class III, BMI 40-49.9 (morbid obesity) ([place])   . Pallor   . Tachycardia   . Thyroiditis, autoimmune   . Vitamin D deficiency     Family History  Problem Relation Age of Onset  . Thyroid disease Mother        Hypothyroid  . Diabetes Father        T2 DM  . Obesity Brother        250 pounds at a height of 76 inches.  . Thyroid disease  Sister        Hypothyroid  . Diabetes Maternal Grandmother   . Colon cancer Other        great grandfather  . Liver disease Maternal Aunt        NASH  . Esophageal cancer Neg Hx   . Pancreatic cancer Neg Hx   . Kidney disease Neg Hx      Current Outpatient Medications:  .  albuterol (PROVENTIL HFA;VENTOLIN HFA) 108 (90 Base) MCG/ACT inhaler, Inhale 2 puffs into the lungs every 6 (six) hours as needed for wheezing or shortness of breath., Disp: 1 Inhaler, Rfl: 11 .  albuterol (PROVENTIL) (2.5 MG/3ML) 0.083% nebulizer solution, Take 3 mLs (2.5 mg total) by nebulization every 6 (six) hours as needed for wheezing or shortness of breath., Disp: 150 mL, Rfl: 1 .  aspirin EC 81 MG EC tablet, Take 1 tablet (81 mg total) by mouth daily., Disp: 30 tablet, Rfl: 0 .  CALCIUM-VITAMIN D PO, Take 800 mg by mouth 2 (two) times daily with a meal. Reported on 02/26/2015, Disp: , Rfl:  .  clonazePAM (KLONOPIN) 0.5 MG tablet, Take 1 tablet (0.5 mg total) by mouth 2 (two) times daily as needed for anxiety., Disp: 30 tablet, Rfl: 1 .  COREG 3.125 MG tablet, Take 1 tablet (3.125 mg total) by mouth 2 (two) times daily with a meal., Disp: 60 tablet, Rfl: 11 .  cyclobenzaprine (FLEXERIL) 10 MG tablet, Take 1 tablet (10 mg total) by mouth 3 (three) times daily as needed for muscle spasms., Disp: 90 tablet, Rfl: 0 .  diphenhydrAMINE (BENADRYL) 25 MG tablet, Take 25-50 mg by mouth 2 (two) times daily as needed for allergies or sleep., Disp: , Rfl:  .  escitalopram (LEXAPRO) 20 MG tablet, Take 1 tablet (20 mg total) by mouth daily., Disp: 90 tablet, Rfl: 1 .  estrogens, conjugated, (PREMARIN) 0.625 MG tablet, Take 0.625 mg by mouth as needed (vaginal dryness). Take daily for 21 days then do not take for 7 days., Disp: , Rfl:  .  Ferrous Sulfate (SLOW FE PO), Take 1 tablet by mouth  3 (three) times a week., Disp: , Rfl:  .  levothyroxine (SYNTHROID, LEVOTHROID) 25 MCG tablet, Take one tablet daily., Disp: 90 tablet, Rfl:  1 .  metFORMIN (GLUCOPHAGE) 1000 MG tablet, Take 1 tablet (1,000 mg total) by mouth 2 (two) times daily with a meal., Disp: 180 tablet, Rfl: 1 .  Multiple Vitamin (MULTIVITAMIN) tablet, Take 1 tablet by mouth daily. Reported on 02/26/2015, Disp: 30 tablet, Rfl: 0 .  Naltrexone-buPROPion HCl ER (CONTRAVE) 8-90 MG TB12, Take 2 tablets by mouth 2 (two) times daily., Disp: 360 tablet, Rfl: 1 .  ondansetron (ZOFRAN) 4 MG tablet, Take 1 tablet (4 mg total) by mouth every 8 (eight) hours as needed for nausea or vomiting., Disp: 20 tablet, Rfl: 0 .  pantoprazole (PROTONIX) 40 MG tablet, TAKE 1 TABLET (40 MG TOTAL) BY MOUTH DAILY., Disp: 90 tablet, Rfl: 4 .  telmisartan (MICARDIS) 40 MG tablet, Take 0.5 tablets (20 mg total) by mouth 2 (two) times daily., Disp: 90 tablet, Rfl: 3 .  terbinafine (LAMISIL) 250 MG tablet, Take 1 tablet (250 mg total) by mouth daily., Disp: 90 tablet, Rfl: 0 .  traMADol (ULTRAM) 50 MG tablet, Take 50 mg by mouth every 6 (six) hours as needed for moderate pain or severe pain., Disp: , Rfl:  .  VICTOZA 18 MG/3ML SOPN, INJECT 1.8 MG DAILY, Disp: 27 mL, Rfl: 1  Allergies as of 11/01/2017 - Review Complete 11/01/2017  Allergen Reaction Noted  . Other Palpitations 05/13/2010  . Theophyllines Palpitations 05/13/2010    1. 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 2. Activities: She has been walking more.   3. Smoking, alcohol, or drugs: None 4. Primary Care Provider: Dr. Tommie Ard Baxley 5. Therapist: Ms. Rea College, MS  REVIEW OF SYSTEM: There are no other significant problems involving Alexa [name]'s other body systems.   Objective:  Vital Signs:  BP 130/68   Pulse 82   Ht 5' 3.62" (1.616 m)   Wt 172 lb 6.4 oz (78.2 kg)   LMP 05/11/2010   BMI 29.95 kg/m    Ht Readings from Last 3 Encounters:  11/01/17 5' 3.62" (1.616 m)  09/20/17 5' 3.5" (1.613 m)   06/14/17 5' 3.58" (1.615 m)   Wt Readings from Last 3 Encounters:  11/01/17 172 lb 6.4 oz (78.2 kg)  09/20/17 173 lb 6.4 oz (78.7 kg)  06/14/17 178 lb 6.4 oz (80.9 kg)   PHYSICAL EXAM:  Constitutional:  Alexa [name] looks good today. She is alert and bright. Her affect and insight are very good. She is wearing a blouse and slacks today, but is not wearing make up or lipstick. She has lost another 1 pound in 5 months.  Her weight is now below the nadir of 173.4 pounds that it was in 2010. Eyes: There is no arcus or proptosis.  Mouth: The oropharynx appears normal. The tongue appears normal. There is normal oral moisture. There is no obvious gingivitis. Neck: There are no bruits present. The thyroid gland appears normal in size. The thyroid gland is again within normal limits at 20 grams in size. The consistency of the thyroid gland is normal. There is no thyroid tenderness to palpation. Lungs: The lungs are clear. Air movement is good. Heart: The heart rhythm and rate appear normal. Heart sounds S1 and S2 are normal. I do not appreciate any pathologic heart murmurs.  Abdomen: The abdomen is enlarged, but smaller over time. . Bowel sounds are normal. The abdomen is soft and non-tender. There is no obviously palpable hepatomegaly, splenomegaly, or other masses.  Arms: Muscle mass appears appropriate for age.  Hands: She has a 1+ tremor. Phalangeal and metacarpophalangeal joints appear normal. Palms are normal. Legs: Muscle mass appears appropriate for age. There is no edema.  Feet: There are no significant deformities. Dorsalis pedis pulses are normal 1+ bilaterally. She has 1+ tinea in her great toenails.  Neurologic: Muscle strength is normal for age and gender  in both the upper and the lower extremities. Muscle tone appears normal. Sensation to touch is normal in the legs and feet.    LAB DATA:  Labs 11/01/17: HbA1c 6.9%, CBG 189 post-prandially  Labs 09/13/17: Hepatic function panel normal; CBC  normal  Labs 08/09/17: Hepatic function panel normal; CBC normal, except Hct 34.7 (35-45)  Labs 06/14/17: CBG 166  Labs 06/08/17: TSH 1.80, free T4 1.3, free T3 2.3; CMP normal except for glucose 115 and creatinine 1.27; PTH 120, calcium 9.7, 25-OH vitamin D 34;cholesterol 188, triglycerides 110, HDL 67, LDL 100; urine microalbumin/creatinine ratio   Labs 04/12/17: HbA1c 10.0%, CBG 174  Labs 03/04/17: Sodium 131, potassium 4.0, chloride 99, CO2 20, glucose 515, calcium 8.9  Labs 01/11/17: CBG 198  Labs 11/24/16: CBG 468; urine glucose 2000+, negative ketones  Labs 11/17/16: HbA1c 13.4%; TSH 1.54; cholesterol 173, triglycerides 91, HDL 71, LDL 83; 25-OH vitamin D 21; CMP normal except for glucose 314; CBC normal except MCHC 26.5 (ref 27-33)   06/04/16: HBA1c 8.1%, CBG 115  05/27/16: TSH 2.62, free T4 1.1, free T3 2.3; CMP normal except for glucose 161, and creatinine 1.09; cholesterol 187, triglycerides 77, HDL 74, LDL 98; urinary microalbumin/creatinine ratio 6; PTH 112, calcium 9.2, 25-OH vitamin D 33  03/17/16: CBG 145  Labs 12/31/15: HbA1c 13.4%, CBG 390; CMP normal except for glucose 344 and creatinine 1.14  Labs 10/08/15: HbA1c 9.6%  Labs 07/02/15: HbA1c 8.9%; PTH 107, calcium 9.2, 25-OH vitamin D 33; CBC normal; CMP normal except for creatinine 1.07; cholesterol 174, triglycerides 91, HDL 74, LDL 82; TSH 2.43, free T4 1.2, free T3 2.3  Labs 02/19/15: PTH 99, calcium 8.7, 25-OH vitamin D 20  Labs 10/08/14: HbA1c 8.0%   Labs 08/06/14: HbA1c 7.4%  Labs 08/01/14: TSH 2.014, free T4 0.87, free T3 2.4; PTH 79 (normal 14-64), calcium 9.1; 25-OH vitamin D 35 CMP normal  Labs 05/11/14: HbA1c was 7.7%, without any low BGs; TSH 2.416, free T4 1.13, free T3 2.3  Labs 02/22/14: HbA1c 11.6%  Labs 02/13/14: Calcium 9.0, PTH 131, 25-OH vitamin D 15; CMP normal except for glucose of 165; cholesterol 177, triglycerides 108, HDL 90, LDL 65; urinary microalbumin/creatinine ratio 15.6; TSH 3.639, free T4  1.30, free T3 2.5  Labs 10/10/13: HbA1c is 9.4%, compared with 8.1% at last visit and with 9.3% in December;   Labs 05/08/13: HbA1c 8.1%; PTH 90.2, calcium 9.5, 25-hydroxy vitamin D 46; CMP normal except for glucose of 215; WBC 8.0, RBC 5.20, Hgb 12.5, Hct 37.8%, MCV 72.7 (normal 78-100), iron 55  Labs 02/23/13: CMP normal, except glucose 194 and alkaline phosphatase 126; microalbumin/creatinine ratio was 11.5; TSH 2.086, free T4 1.29, free T3 3.2, TPO antibody < 10; 25-hydroxy vitamin D 18; C-peptide 1.79; cholesterol 156, triglycerides 64, HDL 61, LDL 82  Labs 01/05/13: Hemoglobin A1c was 9.3%, compared with 9.3 at last visit and with 9.1% at the visit prior. All of  these values were major increases from 6.6% in May 2012.    Labs 04/14/12: 25-hydroxy vitamin D 21, iron 203,  Vitamin B12 379,   Labs 11/16/11: Hgb 10.3, Hct 32.4%; CMP normal except glucose 146' cholesterol 201, triglycerides 70, HDL 66, LDL 121; vitamin D 22           Labs 06/09/10: Cholesterol was 175, triglycerides 76, HDL 62, LDL 98. TSH was 1.217. Free T4 was 1.10. Free T3 was 2.7. 25-hydroxy vitamin D was 19. PTH was 167.   Assessment and Plan:   ASSESSMENT:  1. Type 2 diabetes mellitus:   A. Her T2DM was out of control at her December 2017 visit due to her not taking medications reliably and to not exercising. Her HbA1c of 13.4% was probably the highest that it had ever been up to that time..    B. At her visit in May 2018 her HbA1c had decreased to 8.0%. Her BG control improved dramatically when she took her medications and watched her carb intake. Contrave was helping.   C. Unfortunately, she later relapsed in terms of not taking her medications and other maladaptive health behaviors. Her HbA1c increased back to 13.4% as of 11/17/16.   D. After becoming very frightened by her chest pain and admission in February 2019, she has resumed taking her medications and is doing much better in terms of her DM. Her HbA1c of 6.9% at  this visit is within the  ADA goal range of <7.0%. Since resuming her DM and psych medications and losing weight, her BGs and her mood have improved.  2. Hypoglycemia: She has had some hypoglycemia symptoms again in the mornings She did not reduce her morning metformin dose to 500 mg as I had asked her to do at her last visit, but she needs to do so now.  I recommended that she carry at least 40 grams of glucose with her at all times.  3. Obesity: Her weight has decreased 1 pound since her last visit. The Contrave, Victoza, and her other medications are helping her. She needs to continue to take her medications, continue to Eat Right and exercise daily.    4-5. Autonomic neuropathy and tachycardia: The patient's autonomic neuropathy and  heart rate improved as her BGs decreased.    6. Hypertension: Her BP is much lower today, despite reducing her Micardis medications. She still needs to try to fit in more exercise.  7-8. Anhedonia and anxiety: She is doing much better now, partly due to taking her meds and partly due to working out some of the family problems. She needs to call for help when she begins to do poorly. She also needs to continue to find ways to have more joy in her life. 9-12. GERD/esophagitis/difficulty swallowing/gastroparesis: Her reflux, other GI symptoms, and difficulty swallowing have all improved.   13-15: Hypocalcemia, vitamin D deficiency, and secondary hyperparathyroidism:   A. Due to her bariatric surgery she has more difficulty absorbing vitamin D and calcium. When she develops hypocalcemia and vitamin D deficiency, her parathyroid glands appropriated produce more PTH, resulting in secondary hyperparathyroidism. However, if she takes her calcium and vitamin D all three parameters will normalize.   B. Her calcium was normal in October 2018, but at the lower limit of normal in February 2019. Her vitamin D level was low-normal in May 2019.   C. In May 2019 her PTH was elevated to 120  and her calcium was 9.7. She still appears to have secondary hyperparathyroidism.  She needs to take her vitamin D and calcium very consistently.   D. We ned to repeat her lab tests today.  22. Noncompliance: Since last visit her compliance with eating right and taking her medications has greatly improved.   17: Muscle cramps: Her cramps occur only rarely now, which she attributes to being better hydrated. . Her potassium and calcium were normal in May 2019.  18. Hypothyroidism: Her TFTs in May 2019 were normal.   PLAN:  1. Diagnostic: Calcium, PTH, 25-OH vitamin D,  2. Therapeutic: Continue medications as currently prescribed, except reduce metformin to 500 mg in the mornings and 1000 mg in the evenings. Continue Synthroid at bedtime. Take calcium at mealtimes. Take [employer], 50,000 IU per week to improve her vitamin D level. Try to fit in exercise daily.  See Ms. Showfety in follow up.   3. Patient education: We discussed all of the above at great length. She must continue to take her medications and supplements. She also needs to see me every two months. When she knows that she is coming to see "[facility] Reeks", her compliance improves.  4. Follow-up: 2 months   Level of Service: This visit lasted in excess of 55 minutes. More than 50% of the visit was devoted to counseling.   Tillman Sers, MD, CDE Adult and Pediatric Endocrinology 11/01/2017 9:07 AM

## 2017-11-02 ENCOUNTER — Telehealth: Payer: Self-pay | Admitting: *Deleted

## 2017-11-02 LAB — VITAMIN D 25 HYDROXY (VIT D DEFICIENCY, FRACTURES): Vit D, 25-Hydroxy: 64 ng/mL (ref 30–100)

## 2017-11-02 LAB — CBC WITH DIFFERENTIAL/PLATELET
BASOS PCT: 0.9 %
Basophils Absolute: 52 cells/uL (ref 0–200)
EOS ABS: 122 {cells}/uL (ref 15–500)
Eosinophils Relative: 2.1 %
HCT: 36.1 % (ref 35.0–45.0)
HEMOGLOBIN: 12.2 g/dL (ref 11.7–15.5)
LYMPHS ABS: 1984 {cells}/uL (ref 850–3900)
MCH: 29.2 pg (ref 27.0–33.0)
MCHC: 33.8 g/dL (ref 32.0–36.0)
MCV: 86.4 fL (ref 80.0–100.0)
MPV: 9.9 fL (ref 7.5–12.5)
Monocytes Relative: 6.2 %
NEUTROS ABS: 3283 {cells}/uL (ref 1500–7800)
Neutrophils Relative %: 56.6 %
Platelets: 317 10*3/uL (ref 140–400)
RBC: 4.18 10*6/uL (ref 3.80–5.10)
RDW: 12.8 % (ref 11.0–15.0)
Total Lymphocyte: 34.2 %
WBC mixed population: 360 cells/uL (ref 200–950)
WBC: 5.8 10*3/uL (ref 3.8–10.8)

## 2017-11-02 LAB — PTH, INTACT AND CALCIUM
Calcium: 8.6 mg/dL (ref 8.6–10.4)
PTH: 108 pg/mL — ABNORMAL HIGH (ref 14–64)

## 2017-11-02 LAB — HEPATIC FUNCTION PANEL
AG RATIO: 2.1 (calc) (ref 1.0–2.5)
ALKALINE PHOSPHATASE (APISO): 42 U/L (ref 33–130)
ALT: 11 U/L (ref 6–29)
AST: 14 U/L (ref 10–35)
Albumin: 4.1 g/dL (ref 3.6–5.1)
BILIRUBIN INDIRECT: 0.2 mg/dL (ref 0.2–1.2)
Bilirubin, Direct: 0.1 mg/dL (ref 0.0–0.2)
Globulin: 2 g/dL (calc) (ref 1.9–3.7)
TOTAL PROTEIN: 6.1 g/dL (ref 6.1–8.1)
Total Bilirubin: 0.3 mg/dL (ref 0.2–1.2)

## 2017-11-02 MED ORDER — TERBINAFINE HCL 250 MG PO TABS: 250.0000 mg | ORAL_TABLET | Freq: Every day | ORAL | 0 refills | Status: DC

## 2017-11-02 MED FILL — TERBINAFINE HCL 250 MG TAB: 250 | 30 days supply | Qty: 30 | Fill #0

## 2017-11-02 NOTE — Telephone Encounter (Signed)
Left message informing pt of Dr. Wagoner's review of results and orders. 

## 2017-11-02 NOTE — Telephone Encounter (Signed)
-----   Message from Trula Slade, DPM sent at 11/02/2017  8:52 AM EDT ----- Val- please let her know that her blood work is normal. Can you please go ahead and send over 30 more days of lamisil? Thanks.

## 2017-11-02 NOTE — Progress Notes (Signed)
Subjective: 57 year old female presents the office today for follow-up evaluation after starting Lamisil.  She states that she has a week left of the 33-month course.  She states that she has noticed improvement of the toenails but they are still discolored towards the end of the nail.  Denies any pain in the nails and she denies any redness or drainage or any swelling to the toenail sites and she has no concerns otherwise today  Objective: AAO x3, NAD DP/PT pulses palpable bilaterally, CRT less than 3 seconds Overall the toenails appear to be still hypertrophic, dystrophic with yellow-brown discoloration but there is some clearing on the proximal nail corners and appears to be growing out.  There is no pain in the nails there is no surrounding redness or drainage or any signs of infection present. No open lesions or pre-ulcerative lesions.  No pain with calf compression, swelling, warmth, erythema  Assessment: Onychomycosis, with improvement  Plan: -All treatment options discussed with the patient including all alternatives, risks, complications.  -Overall the toenails are improving.  However not quite as clear as I like at this point.  Because this winter continue Lamisil for total of 30 days.  However prior to starting this medication would recheck blood work including CBC, LFT.  Once this comes back normal we will do 30 days of the medication will check in after she finishes this.  Continue to monitor any side effects which she is not having she is tolerating medication well.  Trula Slade DPM

## 2017-11-04 ENCOUNTER — Encounter (INDEPENDENT_AMBULATORY_CARE_PROVIDER_SITE_OTHER): Payer: 59

## 2017-11-08 ENCOUNTER — Encounter (INDEPENDENT_AMBULATORY_CARE_PROVIDER_SITE_OTHER): Payer: Self-pay | Admitting: *Deleted

## 2017-11-11 MED FILL — PREMARIN VAGINAL CREAM-APPL: 0.625 | 15 days supply | Qty: 30 | Fill #1

## 2017-11-11 MED FILL — VENTOLIN HFA 90 MCG INHALER: 108 (90 BAS | 25 days supply | Qty: 18 | Fill #2

## 2017-11-11 MED FILL — CONTRAVE ER 8-90 MG TABLET: 8-90 | 30 days supply | Qty: 120 | Fill #5

## 2017-11-16 ENCOUNTER — Ambulatory Visit (INDEPENDENT_AMBULATORY_CARE_PROVIDER_SITE_OTHER): Payer: 59 | Admitting: Family Medicine

## 2017-11-16 ENCOUNTER — Encounter (INDEPENDENT_AMBULATORY_CARE_PROVIDER_SITE_OTHER): Payer: Self-pay | Admitting: Family Medicine

## 2017-11-16 VITALS — BP 138/83 | HR 73 | Temp 97.5°F | Ht 63.0 in | Wt 169.0 lb

## 2017-11-16 DIAGNOSIS — E119 Type 2 diabetes mellitus without complications: Secondary | ICD-10-CM

## 2017-11-16 DIAGNOSIS — Z1331 Encounter for screening for depression: Secondary | ICD-10-CM | POA: Diagnosis not present

## 2017-11-16 DIAGNOSIS — I5189 Other ill-defined heart diseases: Secondary | ICD-10-CM

## 2017-11-16 DIAGNOSIS — Z0289 Encounter for other administrative examinations: Secondary | ICD-10-CM

## 2017-11-16 DIAGNOSIS — R0602 Shortness of breath: Secondary | ICD-10-CM

## 2017-11-16 DIAGNOSIS — E669 Obesity, unspecified: Secondary | ICD-10-CM | POA: Diagnosis not present

## 2017-11-16 DIAGNOSIS — R5383 Other fatigue: Secondary | ICD-10-CM | POA: Diagnosis not present

## 2017-11-16 DIAGNOSIS — Z9189 Other specified personal risk factors, not elsewhere classified: Secondary | ICD-10-CM | POA: Diagnosis not present

## 2017-11-16 DIAGNOSIS — I519 Heart disease, unspecified: Secondary | ICD-10-CM

## 2017-11-16 DIAGNOSIS — Z6830 Body mass index (BMI) 30.0-30.9, adult: Secondary | ICD-10-CM

## 2017-11-16 DIAGNOSIS — E66811 Obesity, class 1: Secondary | ICD-10-CM

## 2017-11-17 LAB — MICROALBUMIN / CREATININE URINE RATIO
CREATININE, UR: 127.2 mg/dL
MICROALB/CREAT RATIO: 4.8 mg/g{creat} (ref 0.0–30.0)
MICROALBUM., U, RANDOM: 6.1 ug/mL

## 2017-11-17 LAB — VITAMIN B12: VITAMIN B 12: 282 pg/mL (ref 232–1245)

## 2017-11-17 LAB — FOLATE: FOLATE: 12.7 ng/mL (ref >3.0)

## 2017-11-17 LAB — INSULIN, RANDOM: INSULIN: 8.1 u[IU]/mL (ref 2.6–24.9)

## 2017-11-18 NOTE — Progress Notes (Signed)
.  Office: [phone]  /  Fax: [phone]   HPI:   Chief Complaint: OBESITY  Alexa [name] (MR# [id number]) is a 57 y.o. female who presents on 11/18/2017 for obesity evaluation and treatment. Current BMI is Body mass index is 29.94 kg/m.Marland Kitchen Alexa [name] has struggled with obesity for years and has been unsuccessful in either losing weight or maintaining long term weight loss. Alexa [name] attended our information session and states she is currently in the action stage of change and ready to dedicate time achieving and maintaining a healthier weight.  Alexa [name] states her family eats meals together she thinks her family will eat healthier with  her her desired weight loss is 20 lbs she has been heavy most of  her life she started gaining weight in her teens her heaviest weight ever was 290 lbs. she is a picky eater and doesn't like to eat healthier foods  she has significant food cravings issues  she skips meals frequently she is frequently drinking liquids with calories she frequently makes poor food choices she has problems with excessive hunger  she frequently eats larger portions than normal  she has binge eating behaviors she struggles with emotional eating   Status Post Weight Loss Surgery ([facility]P) Alexa [name] is status post weight loss surgery (gastric bypass) in 2008 by Dr. Lucia Gaskins. She went from 268 pounds, down to 169 pounds in twelve to eighteen months and she started regaining her weight approximately six years later. She is now back to 169 pounds today and her BMI is 30.0, which is the lowest weight we accept. Alexa [name] is on Contrave, but she has tapered off of this due to her upcoming surgery.   Fatigue Alexa [name] feels her energy is lower than it should be. This has worsened with weight gain and has not worsened recently. Alexa [name] denies daytime somnolence and admits to waking up still tired. Patient is at risk for obstructive sleep apnea. Patent has a history of symptoms of morning fatigue. Patient  generally gets 5 or 6 hours of sleep per night, and states they generally have restless. Snoring is not present. Apneic episodes are not present. Epworth Sleepiness Score is 0  Dyspnea on exertion Alexa [name] notes increasing shortness of breath with exercising and seems to be worsening over time with weight gain. She notes getting out of breath sooner with activity than she used to. This has not gotten worse recently. Alexa [name] denies orthopnea.  Diabetes II Alexa [name] has a diagnosis of diabetes type II. She has a long history of on and off compliance per her endocrinologist's notes. Her most recent A1c was at 6.9 on metformin and victoza. Alexa [name] denies any hypoglycemic episodes. She is attempting to work on intensive lifestyle modifications including diet, exercise, and weight loss to help control her blood glucose levels.  Diastolic Dysfunction Grade I Echocardiogram done in February 4665 shows diastolic dysfunction with ejection fraction in the range of 60 to 65% and mild mitral regurgitation. Alexa [name] denies chest pain. Alexa [name] has trace edema in bilateral lower extremities.  At risk for cardiovascular disease Alexa [name] is at a higher than average risk for cardiovascular disease due to obesity, diabetes and diastolic dysfunction. She currently denies any chest pain.  Positive Depression Screen Alexa [name] has a PHQ-9 score of 17 with her weight making her feel that life isn't worth living at least half of the time. Alexa [name] is currently taking Lexapro. She shows no sign of suicidal or homicidal ideations.  Alexa [name]'s Food and Mood (modified PHQ-9) score was  Depression screen  PHQ 2/9 11/16/2017  Decreased Interest 2  Down, Depressed, Hopeless 3  PHQ - 2 Score 5  Altered sleeping 1  Tired, decreased energy 2  Change in appetite 2  Feeling bad or failure about yourself  2  Trouble concentrating 1  Moving slowly or fidgety/restless 2  Suicidal thoughts 2  PHQ-9 Score 17  Difficult doing work/chores Somewhat  difficult    ALLERGIES: Allergies  Allergen Reactions  . Other Palpitations  . Theophyllines Palpitations    MEDICATIONS: Current Outpatient Medications on File Prior to Visit  Medication Sig Dispense Refill  . albuterol (PROVENTIL HFA;VENTOLIN HFA) 108 (90 Base) MCG/ACT inhaler Inhale 2 puffs into the lungs every 6 (six) hours as needed for wheezing or shortness of breath. 1 Inhaler 11  . albuterol (PROVENTIL) (2.5 MG/3ML) 0.083% nebulizer solution Take 3 mLs (2.5 mg total) by nebulization every 6 (six) hours as needed for wheezing or shortness of breath. 150 mL 1  . CALCIUM-VITAMIN D PO Take 800 mg by mouth 2 (two) times daily with a meal. Reported on 02/26/2015    . COREG 3.125 MG tablet Take 1 tablet (3.125 mg total) by mouth 2 (two) times daily with a meal. 60 tablet 11  . cyclobenzaprine (FLEXERIL) 10 MG tablet Take 1 tablet (10 mg total) by mouth 3 (three) times daily as needed for muscle spasms. 90 tablet 0  . escitalopram (LEXAPRO) 20 MG tablet Take 1 tablet (20 mg total) by mouth daily. 90 tablet 1  . estrogens, conjugated, (PREMARIN) 0.625 MG tablet Take 0.625 mg by mouth as needed (vaginal dryness). Take daily for 21 days then do not take for 7 days.    . Ferrous Sulfate (SLOW FE PO) Take 1 tablet by mouth 3 (three) times a week.    . levothyroxine (SYNTHROID, LEVOTHROID) 25 MCG tablet Take one tablet daily. 90 tablet 1  . metFORMIN (GLUCOPHAGE) 1000 MG tablet Take 1 tablet (1,000 mg total) by mouth 2 (two) times daily with a meal. 180 tablet 1  . Multiple Vitamin (MULTIVITAMIN) tablet Take 1 tablet by mouth daily. Reported on 02/26/2015 30 tablet 0  . Naltrexone-buPROPion HCl ER (CONTRAVE) 8-90 MG TB12 Take 2 tablets by mouth 2 (two) times daily. 360 tablet 1  . ondansetron (ZOFRAN) 4 MG tablet Take 1 tablet (4 mg total) by mouth every 8 (eight) hours as needed for nausea or vomiting. 20 tablet 0  . pantoprazole (PROTONIX) 40 MG tablet TAKE 1 TABLET (40 MG TOTAL) BY MOUTH DAILY.  90 tablet 4  . telmisartan (MICARDIS) 40 MG tablet Take 0.5 tablets (20 mg total) by mouth 2 (two) times daily. 90 tablet 3  . terbinafine (LAMISIL) 250 MG tablet Take 1 tablet (250 mg total) by mouth daily. 30 tablet 0  . traMADol (ULTRAM) 50 MG tablet Take 50 mg by mouth every 6 (six) hours as needed for moderate pain or severe pain.    Marland Kitchen VICTOZA 18 MG/3ML SOPN INJECT 1.8 MG DAILY 27 mL 1   No current facility-administered medications on file prior to visit.     PAST MEDICAL HISTORY: Past Medical History:  Diagnosis Date  . Anemia, iron deficiency   . Anhedonia   . Anxiety   . Asthma   . Asthma, chronic   . Back pain   . CAD (coronary artery disease)   . Chewing difficulty   . Chronic insomnia   . Combined hyperlipidemia   . Constipation   . Depression   . Diabetes mellitus   .  Diabetes mellitus type II   . Diabetic autonomic neuropathy ([place])   . Difficulty swallowing pills   . DM type 2 with diabetic peripheral neuropathy ([place])   . Dry mouth   . Dyspepsia   . Dyspnea   . 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 Essential tremor   . Fatigue   . Fatty liver   . GERD (gastroesophageal reflux disease)   . GERD (gastroesophageal reflux disease)   . Glossitis   . Goiter   . Hemorrhoids   . HLD (hyperlipidemia)   . Hyperparathyroidism , secondary, non-renal ([place])   . Hypertension   . Hypocalcemia   . Insomnia   . Leg edema   . Leg pain   . Macular degeneration   . Nonproliferative diabetic retinopathy associated with type 2 diabetes mellitus ([place])    RESOLVED  . Obesity, Class III, BMI 40-49.9 (morbid obesity) ([place])   . Osteopenia   . Osteoporosis   . Pallor   . Status post gastric surgery   . Tachycardia   . Thyroiditis, autoimmune   . Vaginal dryness, menopausal   . Vitamin D deficiency     PAST SURGICAL HISTORY: Past Surgical History:  Procedure Laterality Date  . CARDIAC CATHETERIZATION     x 2  . ROUX-EN-Y PROCEDURE    . TONSILLECTOMY      SOCIAL  HISTORY: Social History   Tobacco Use  . Smoking status: Never Smoker  . Smokeless tobacco: Never Used  Substance Use Topics  . Alcohol use: No    Alcohol/week: 0.0 standard drinks  . Drug use: No    FAMILY HISTORY: Family History  Problem Relation Age of Onset  . Thyroid disease Mother        Hypothyroid  . Depression Mother   . Liver disease Mother   . Obesity Mother   . Diabetes Father        T2 DM  . Hypertension Father   . Hyperlipidemia Father   . Heart disease Father   . Obesity Brother        250 pounds at a height of 76 inches.  . Thyroid disease Sister        Hypothyroid  . Diabetes Maternal Grandmother   . Colon cancer Other        great grandfather  . Liver disease Maternal Aunt        NASH  . Esophageal cancer Neg Hx   . Pancreatic cancer Neg Hx   . Kidney disease Neg Hx     ROS: Review of Systems  Constitutional: Positive for malaise/fatigue.  HENT:       + Nasal Discharge + Difficult or Painful Swallowing + Dry Mouth  Eyes:       + Vision Changes (macular degeneration) + Wear Glasses (readers) + Floaters   Respiratory: Positive for shortness of breath (on exertion).   Cardiovascular: Negative for chest pain and orthopnea.       + Chest Pain/Discomfort + Leg Cramping + Very Cold Feet or Hands  Gastrointestinal: Positive for constipation, heartburn and nausea.       + Swallowing Difficulty + Rectal Bleeding  Genitourinary: Positive for frequency.  Musculoskeletal: Positive for back pain and neck pain.       + Neck Stiffness + Muscle Stiffness + Muscle or Joint Pain  Skin: Positive for itching.       + Dryness  Neurological: Positive for tremors.  Endo/Heme/Allergies: Positive for polydipsia. Bruises/bleeds easily (bruising).  Negative for hypoglycemia + Polyphagia + Heat or Cold Intolerance  Psychiatric/Behavioral: Positive for depression. Negative for suicidal ideas. The patient is nervous/anxious (nervousness) and has  insomnia.        + Stress    PHYSICAL EXAM: Blood pressure 138/83, pulse 73, temperature (!) 97.5 F (36.4 C), temperature source Oral, height 5\' 3"  (1.6 m), weight 169 lb (76.7 kg), last menstrual period 05/11/2010, SpO2 100 %. Body mass index is 29.94 kg/m. Physical Exam  Constitutional: She is oriented to person, place, and time. She appears well-developed and well-nourished.  HENT:  Head: Normocephalic and atraumatic.  Nose: Nose normal.  Eyes: EOM are normal. No scleral icterus.  Neck: Normal range of motion. Neck supple. No thyromegaly present.  Cardiovascular: Normal rate and regular rhythm.  Murmur heard.  Systolic (early) murmur is present with a grade of 2/6. Pulmonary/Chest: No respiratory distress.  Abdominal: Soft. There is no tenderness.  + Obesity  Musculoskeletal: Normal range of motion. She exhibits edema (trace edema bilateral lower extremities).  Range of Motion normal in all 4 extremities  Neurological: She is alert and oriented to person, place, and time. Coordination normal.  Skin: Skin is warm and dry.  Psychiatric: She has a normal mood and affect. She expresses no homicidal and no suicidal ideation.  Vitals reviewed.   RECENT LABS AND TESTS: BMET    Component Value Date/Time   NA 135 06/08/2017 0000   K 4.7 06/08/2017 0000   CL 102 06/08/2017 0000   CO2 22 06/08/2017 0000   GLUCOSE 115 (H) 06/08/2017 0000   BUN 20 06/08/2017 0000   CREATININE 1.27 (H) 06/08/2017 0000   CALCIUM 8.6 11/01/2017 0000   CALCIUM 9.3 08/14/2011 1217   GFRNONAA >60 03/06/2017 0451   GFRNONAA 67 11/17/2016 0941   GFRAA >60 03/06/2017 0451   GFRAA 78 11/17/2016 0941   Lab Results  Component Value Date   HGBA1C 6.9 (A) 11/01/2017   Lab Results  Component Value Date   INSULIN 8.1 11/16/2017   CBC    Component Value Date/Time   WBC 5.8 11/01/2017 1142   RBC 4.18 11/01/2017 1142   HGB 12.2 11/01/2017 1142   HCT 36.1 11/01/2017 1142   PLT 317 11/01/2017 1142    MCV 86.4 11/01/2017 1142   MCH 29.2 11/01/2017 1142   MCHC 33.8 11/01/2017 1142   RDW 12.8 11/01/2017 1142   LYMPHSABS 1,984 11/01/2017 1142   MONOABS 0.8 10/19/2015 1327   EOSABS 122 11/01/2017 1142   BASOSABS 52 11/01/2017 1142   Iron/TIBC/Ferritin/ %Sat    Component Value Date/Time   IRON 55 05/08/2013 1622   TIBC 357 04/14/2012 1059   IRONPCTSAT 57 (H) 04/14/2012 1059   Lipid Panel     Component Value Date/Time   CHOL 188 06/08/2017 0000   TRIG 110 06/08/2017 0000   HDL 67 06/08/2017 0000   CHOLHDL 2.8 06/08/2017 0000   VLDL 10 03/05/2017 1903   LDLCALC 100 (H) 06/08/2017 0000   Hepatic Function Panel     Component Value Date/Time   PROT 6.1 11/01/2017 1142   ALBUMIN 4.1 05/27/2016 0828   AST 14 11/01/2017 1142   ALT 11 11/01/2017 1142   ALKPHOS 85 05/27/2016 0828   BILITOT 0.3 11/01/2017 1142   BILIDIR 0.1 11/01/2017 1142   IBILI 0.2 11/01/2017 1142      Component Value Date/Time   TSH 1.80 06/08/2017 0000   Vitamin D Results for ESHIKA, RECKART (MRN [id number]) as of 11/18/2017 08:25  Ref. Range 11/01/2017 00:00  Vitamin D, 25-Hydroxy Latest Ref Range: 30 - 100 ng/mL 64    ECG  shows NSR with a rate of 85 BPM INDIRECT CALORIMETER done today shows a VO2 of 266 and a REE of 1851. Her calculated basal metabolic rate is 7001 thus her basal metabolic rate is better than expected.    ASSESSMENT AND PLAN: Other fatigue - Plan: EKG 12-Lead, Vitamin B12, Folate  Shortness of breath on exertion  Type 2 diabetes mellitus without complication, without long-term current use of insulin (HCC) - Plan: Insulin, random, Microalbumin / creatinine urine ratio  Grade I diastolic dysfunction  Positive depression screening  At risk for heart disease  Class 1 obesity with serious comorbidity and body mass index (BMI) of 30.0 to 30.9 in adult, unspecified obesity type  PLAN:  Fatigue Sharice was informed that her fatigue may be related to obesity, depression or many  other causes. Labs will be ordered, and in the meanwhile Annaya has agreed to work on diet, exercise and weight loss to help with fatigue. Proper sleep hygiene was discussed including the need for 7-8 hours of quality sleep each night. A sleep study was not ordered based on symptoms and Epworth score.  Dyspnea on exertion Kathryn's shortness of breath appears to be obesity related and exercise induced. She has agreed to work on weight loss and gradually increase exercise to treat her exercise induced shortness of breath. If Alexa [name] follows our instructions and loses weight without improvement of her shortness of breath, we will plan to refer to pulmonology. We will monitor this condition regularly. Alexa [name] agrees to this plan.  Diabetes II Alexa [name] has been given extensive diabetes education by myself today including ideal fasting and post-prandial blood glucose readings, individual ideal Hgb A1c goals and hypoglycemia prevention. We discussed the importance of good blood sugar control to decrease the likelihood of diabetic complications such as nephropathy, neuropathy, limb loss, blindness, coronary artery disease, and death. We discussed the importance of intensive lifestyle modification including diet, exercise and weight loss as the first line treatment for diabetes. We will check fasting insulin and urine MAB. Alexa [name] agrees to start diet prescription and continue her diabetes medications. Alexa [name] will follow up at the agreed upon time.  Diastolic Dysfunction Grade I Alexa [name] will continue with diet and decreasing sodium intake. We will continue to monitor and Alexa [name] agrees to follow up as directed.  Cardiovascular risk counseling Alexa [name] was given extended (15 minutes) coronary artery disease prevention counseling today. She is 57 y.o. female and has risk factors for heart disease including obesity, diabetes and diastolic dysfunction. We discussed intensive lifestyle modifications today with an emphasis on  specific weight loss instructions and strategies. Pt was also informed of the importance of increasing exercise and decreasing saturated fats to help prevent heart disease.  Positive Depression Screen Elyssia had a strongly positive depression screening. Depression is commonly associated with obesity and often results in emotional eating behaviors. We will monitor this closely and work on CBT to help improve the non-hunger eating patterns.  Kaitlan will continue to take Lexapro and we may refer to Dr. Mallie Mussel our bariatric psychologist after her first follow up visit.  Obesity Roselind is currently in the action stage of change and her goal is to continue with weight loss efforts She has agreed to follow the Category 2 plan Maziyah has been instructed to work up to a goal of 150 minutes of combined cardio and strengthening exercise per week for weight loss  and overall health benefits. We discussed the following Behavioral Modification Strategies today: no skipping meals, increasing lean protein intake, decreasing simple carbohydrates  and work on meal planning and easy cooking plans  Shakhia has agreed to follow up with our clinic in 2 weeks. She was informed of the importance of frequent follow up visits to maximize her success with intensive lifestyle modifications for her multiple health conditions. She was informed we would discuss her lab results at her next visit unless there is a critical issue that needs to be addressed sooner. Reba agreed to keep her next visit at the agreed upon time to discuss these results.    OBESITY BEHAVIORAL INTERVENTION VISIT  Today's visit was # 1   Starting weight: 169 lbs Starting date: 11/16/17 Today's weight : 169 lbs Today's date: 11/16/2017 Total lbs lost to date: 0   ASK: We discussed the diagnosis of obesity with [name] today and Kayleah agreed to give [name] permission to discuss obesity behavioral modification therapy today.  ASSESS: Stacee  has the diagnosis of obesity and her BMI today is 29.94 Anjelique is in the action stage of change   ADVISE: Josetta was educated on the multiple health risks of obesity as well as the benefit of weight loss to improve her health. She was advised of the need for long term treatment and the importance of lifestyle modifications to improve her current health and to decrease her risk of future health problems.  AGREE: Multiple dietary modification options and treatment options were discussed and  Lenoir agreed to follow the recommendations documented in the above note.  ARRANGE: Rosmary was educated on the importance of frequent visits to treat obesity as outlined per CMS and USPSTF guidelines and agreed to schedule her next follow up appointment today.   I, Doreene Nest, am acting as transcriptionist for Dennard Nip, MD   I have reviewed the above documentation for accuracy and completeness, and I agree with the above. -Dennard Nip, MD

## 2017-11-22 ENCOUNTER — Encounter: Payer: Self-pay | Admitting: Gastroenterology

## 2017-11-23 ENCOUNTER — Encounter (INDEPENDENT_AMBULATORY_CARE_PROVIDER_SITE_OTHER): Payer: Self-pay | Admitting: Family Medicine

## 2017-11-26 HISTORY — PX: REDUCTION MAMMAPLASTY: SUR839

## 2017-12-02 ENCOUNTER — Encounter (HOSPITAL_BASED_OUTPATIENT_CLINIC_OR_DEPARTMENT_OTHER): Payer: Self-pay | Admitting: *Deleted

## 2017-12-02 ENCOUNTER — Ambulatory Visit (INDEPENDENT_AMBULATORY_CARE_PROVIDER_SITE_OTHER): Payer: 59 | Admitting: Family Medicine

## 2017-12-02 ENCOUNTER — Other Ambulatory Visit: Payer: Self-pay | Admitting: Internal Medicine

## 2017-12-02 ENCOUNTER — Other Ambulatory Visit: Payer: Self-pay

## 2017-12-02 ENCOUNTER — Encounter (HOSPITAL_BASED_OUTPATIENT_CLINIC_OR_DEPARTMENT_OTHER)
Admission: RE | Admit: 2017-12-02 | Discharge: 2017-12-02 | Disposition: A | Discharge status: Home / Self Care | Payer: 59 | Source: Ambulatory Visit | Attending: Plastic Surgery | Admitting: Plastic Surgery

## 2017-12-02 ENCOUNTER — Encounter (HOSPITAL_COMMUNITY)
Admission: RE | Admit: 2017-12-02 | Discharge: 2017-12-02 | Disposition: A | Discharge status: Home / Self Care | Payer: 59 | Source: Ambulatory Visit | Attending: Plastic Surgery | Admitting: Plastic Surgery

## 2017-12-02 ENCOUNTER — Ambulatory Visit: Payer: 59 | Admitting: Podiatry

## 2017-12-02 VITALS — BP 132/81 | HR 102 | Temp 98.0°F | Ht 63.0 in | Wt 173.0 lb

## 2017-12-02 DIAGNOSIS — Z01812 Encounter for preprocedural laboratory examination: Secondary | ICD-10-CM | POA: Insufficient documentation

## 2017-12-02 DIAGNOSIS — E119 Type 2 diabetes mellitus without complications: Secondary | ICD-10-CM

## 2017-12-02 DIAGNOSIS — Z79899 Other long term (current) drug therapy: Secondary | ICD-10-CM

## 2017-12-02 DIAGNOSIS — Z6830 Body mass index (BMI) 30.0-30.9, adult: Secondary | ICD-10-CM

## 2017-12-02 DIAGNOSIS — N62 Hypertrophy of breast: Secondary | ICD-10-CM | POA: Diagnosis not present

## 2017-12-02 DIAGNOSIS — Z0000 Encounter for general adult medical examination without abnormal findings: Secondary | ICD-10-CM

## 2017-12-02 DIAGNOSIS — G8929 Other chronic pain: Secondary | ICD-10-CM | POA: Diagnosis not present

## 2017-12-02 DIAGNOSIS — E669 Obesity, unspecified: Secondary | ICD-10-CM

## 2017-12-02 DIAGNOSIS — I519 Heart disease, unspecified: Secondary | ICD-10-CM

## 2017-12-02 DIAGNOSIS — B351 Tinea unguium: Secondary | ICD-10-CM | POA: Diagnosis not present

## 2017-12-02 DIAGNOSIS — E66811 Obesity, class 1: Secondary | ICD-10-CM

## 2017-12-02 DIAGNOSIS — I5189 Other ill-defined heart diseases: Secondary | ICD-10-CM

## 2017-12-02 DIAGNOSIS — E7849 Other hyperlipidemia: Secondary | ICD-10-CM

## 2017-12-02 DIAGNOSIS — L304 Erythema intertrigo: Secondary | ICD-10-CM | POA: Diagnosis not present

## 2017-12-02 DIAGNOSIS — M542 Cervicalgia: Secondary | ICD-10-CM | POA: Diagnosis not present

## 2017-12-02 DIAGNOSIS — M549 Dorsalgia, unspecified: Secondary | ICD-10-CM | POA: Diagnosis not present

## 2017-12-02 LAB — BASIC METABOLIC PANEL
Anion gap: 11 (ref 5–15)
BUN: 20 mg/dL (ref 6–20)
CALCIUM: 8.9 mg/dL (ref 8.9–10.3)
CHLORIDE: 103 mmol/L (ref 98–111)
CO2: 20 mmol/L — ABNORMAL LOW (ref 22–32)
CREATININE: 1.17 mg/dL — AB (ref 0.44–1.00)
GFR calc Af Amer: 59 mL/min — ABNORMAL LOW (ref >60)
GFR calc non Af Amer: 51 mL/min — ABNORMAL LOW (ref >60)
GLUCOSE: 322 mg/dL — AB (ref 70–99)
Potassium: 4.1 mmol/L (ref 3.5–5.1)
Sodium: 134 mmol/L — ABNORMAL LOW (ref 135–145)

## 2017-12-02 MED ORDER — TERBINAFINE HCL 250 MG PO TABS: 250.0000 mg | ORAL_TABLET | Freq: Every day | ORAL | 0 refills | Status: DC

## 2017-12-02 MED FILL — TERBINAFINE HCL 250 MG TAB: 250 | 30 days supply | Qty: 30 | Fill #0

## 2017-12-02 MED FILL — HYDROCODON-APAP 5-325: 5-325 | 30 days supply | Qty: 30 | Fill #0
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
## 2017-12-02 NOTE — Progress Notes (Signed)
Lab results reviewed per Dr. Eligha Bridegroom, Notified pt and Dr. Para Skeans office of [facility].

## 2017-12-03 LAB — HEPATIC FUNCTION PANEL
AG RATIO: 1.9 (calc) (ref 1.0–2.5)
ALT: 13 U/L (ref 6–29)
AST: 13 U/L (ref 10–35)
Albumin: 4.1 g/dL (ref 3.6–5.1)
Alkaline phosphatase (APISO): 70 U/L (ref 33–130)
BILIRUBIN INDIRECT: 0.2 mg/dL (ref 0.2–1.2)
BILIRUBIN TOTAL: 0.3 mg/dL (ref 0.2–1.2)
Bilirubin, Direct: 0.1 mg/dL (ref 0.0–0.2)
GLOBULIN: 2.2 g/dL (ref 1.9–3.7)
Total Protein: 6.3 g/dL (ref 6.1–8.1)

## 2017-12-03 LAB — CBC WITH DIFFERENTIAL/PLATELET
BASOS ABS: 57 {cells}/uL (ref 0–200)
BASOS PCT: 0.7 %
EOS ABS: 113 {cells}/uL (ref 15–500)
Eosinophils Relative: 1.4 %
HCT: 35.3 % (ref 35.0–45.0)
HEMOGLOBIN: 11.8 g/dL (ref 11.7–15.5)
Lymphs Abs: 2673 cells/uL (ref 850–3900)
MCH: 28.6 pg (ref 27.0–33.0)
MCHC: 33.4 g/dL (ref 32.0–36.0)
MCV: 85.5 fL (ref 80.0–100.0)
MONOS PCT: 7.2 %
MPV: 9.7 fL (ref 7.5–12.5)
Neutro Abs: 4674 cells/uL (ref 1500–7800)
Neutrophils Relative %: 57.7 %
PLATELETS: 407 10*3/uL — AB (ref 140–400)
RBC: 4.13 10*6/uL (ref 3.80–5.10)
RDW: 12.6 % (ref 11.0–15.0)
TOTAL LYMPHOCYTE: 33 %
WBC mixed population: 583 cells/uL (ref 200–950)
WBC: 8.1 10*3/uL (ref 3.8–10.8)

## 2017-12-05 NOTE — Progress Notes (Signed)
Subjective: 57 year old female presents the office today for follow-up evaluation of nail fungus.  She states that is about the same as last appointment.  She has almost completed her 18-month course but she states that she had a couple days left of her 3 months.  She is asked about the treatment options as well and wondering if she can continue Lamisil.  No pain in the nails. Denies any systemic complaints such as fevers, chills, nausea, vomiting. No acute changes since last appointment, and no other complaints at this time.   Objective: AAO x3, NAD DP/PT pulses palpable bilaterally, CRT less than 3 seconds Bilateral hallux toenails appear to be getting better however there is still dystrophy to the toenail as well as discoloration more to the distal portion of the nail.  There is no pain in the nail there is no swelling redness or drainage. No open lesions or pre-ulcerative lesions.  No pain with calf compression, swelling, warmth, erythema  Assessment: Onychomycosis  Plan: -All treatment options discussed with the patient including all alternatives, risks, complications.  -According to the Lamisil 30 more days for total of 4 months.  I would recheck a CBC and LFT today.  Also we discussed other options including laser and she wants to proceed with laser therapy.  Today she completed her first treatment without any complications and this was done today by Marylou Mccoy, RN. -Patient encouraged to call the office with any questions, concerns, change in symptoms.   Return in about 4 weeks (around 12/30/2017). This is for laser with JQ  Trula Slade DPM

## 2017-12-06 ENCOUNTER — Telehealth: Payer: Self-pay | Admitting: *Deleted

## 2017-12-06 ENCOUNTER — Other Ambulatory Visit: Payer: 59 | Admitting: Internal Medicine

## 2017-12-06 NOTE — Telephone Encounter (Signed)
I informed pt of Dr. Leigh Aurora review of results and order.

## 2017-12-06 NOTE — Telephone Encounter (Signed)
-----   Message from Trula Slade, DPM sent at 12/03/2017  2:26 PM EST ----- Let her know that her liver function is normal and can do 1 more month of the Lamisil. Thanks.

## 2017-12-06 NOTE — Progress Notes (Signed)
Office: [phone]  /  Fax: [phone]   HPI:   Chief Complaint: OBESITY Alexa [name] is here to discuss her progress with her obesity treatment plan. She is on the Category 2 plan and is following her eating plan approximately 25 % of the time. She states she is exercising 0 minutes 0 times per week. Alexa [name] struggled to follow her plan. She states she was unable to meal prep and indulged in excessive Halloween candy. She is having breast reduction surgery soon and company before this, and she doesn't think she will be able to do the required meal prep or grocery shopping.  Her weight is 173 lb (78.5 kg) today and has gained 4 pounds since her last visit. She has lost 0 lbs since starting treatment with [name].  Diabetes II Alexa [name] has a diagnosis of diabetes type II. Alexa [name] is on Victoza 1.8 mg and metformin but doesn't take her medications regularly. She knows this is related to stress and feeling overwhelmed. She did not bring BGs log today either. She struggled to follow her diet prescription. She denies hypoglycemia. Last A1c was 6.9. She has been working on intensive lifestyle modifications including diet, exercise, and weight loss to help control her blood glucose levels.  ALLERGIES: Allergies  Allergen Reactions  . Theophyllines Palpitations    MEDICATIONS: Current Outpatient Medications on File Prior to Visit  Medication Sig Dispense Refill  . albuterol (PROVENTIL HFA;VENTOLIN HFA) 108 (90 Base) MCG/ACT inhaler Inhale 2 puffs into the lungs every 6 (six) hours as needed for wheezing or shortness of breath. 1 Inhaler 11  . albuterol (PROVENTIL) (2.5 MG/3ML) 0.083% nebulizer solution Take 3 mLs (2.5 mg total) by nebulization every 6 (six) hours as needed for wheezing or shortness of breath. 150 mL 1  . CALCIUM-VITAMIN D PO Take 800 mg by mouth 2 (two) times daily with a meal.     . conjugated estrogens (PREMARIN) vaginal cream Place 1 Applicatorful vaginally at bedtime. Use for 21 days  then off for 7 days    . COREG 3.125 MG tablet Take 1 tablet (3.125 mg total) by mouth 2 (two) times daily with a meal. 60 tablet 11  . cyclobenzaprine (FLEXERIL) 10 MG tablet Take 1 tablet (10 mg total) by mouth 3 (three) times daily as needed for muscle spasms. (Patient taking differently: Take 10 mg by mouth daily as needed for muscle spasms. ) 90 tablet 0  . escitalopram (LEXAPRO) 20 MG tablet Take 1 tablet (20 mg total) by mouth daily. 90 tablet 1  . Ferrous Sulfate (SLOW FE PO) Take 1 tablet by mouth daily.     Marland Kitchen ibuprofen (ADVIL,MOTRIN) 200 MG tablet Take 600 mg by mouth daily as needed for headache or moderate pain.    Marland Kitchen levothyroxine (SYNTHROID, LEVOTHROID) 25 MCG tablet Take one tablet daily. (Patient taking differently: Take 25 mcg by mouth every evening. ) 90 tablet 1  . metFORMIN (GLUCOPHAGE) 1000 MG tablet Take 1 tablet (1,000 mg total) by mouth 2 (two) times daily with a meal. 180 tablet 1  . Multiple Vitamin (MULTIVITAMIN) tablet Take 1 tablet by mouth daily. Reported on 02/26/2015 30 tablet 0  . ondansetron (ZOFRAN) 4 MG tablet Take 1 tablet (4 mg total) by mouth every 8 (eight) hours as needed for nausea or vomiting. 20 tablet 0  . pantoprazole (PROTONIX) 40 MG tablet TAKE 1 TABLET (40 MG TOTAL) BY MOUTH DAILY. 90 tablet 4  . telmisartan (MICARDIS) 40 MG tablet Take 0.5 tablets (20 mg total)  by mouth 2 (two) times daily. 90 tablet 3  . traMADol (ULTRAM) 50 MG tablet Take 50 mg by mouth daily as needed for moderate pain or severe pain.     Marland Kitchen VICTOZA 18 MG/3ML SOPN INJECT 1.8 MG DAILY (Patient taking differently: Inject 1.8 mg into the skin every evening. ) 27 mL 1  . Vitamin D, Ergocalciferol, (DRISDOL) 50000 units CAPS capsule Take 50,000 Units by mouth every Sunday.    . Naltrexone-buPROPion HCl ER (CONTRAVE) 8-90 MG TB12 Take 2 tablets by mouth 2 (two) times daily. (Patient not taking: Reported on 12/02/2017) 360 tablet 1  . terbinafine (LAMISIL) 250 MG tablet Take 1 tablet (250 mg  total) by mouth daily. 30 tablet 0   No current facility-administered medications on file prior to visit.     PAST MEDICAL HISTORY: Past Medical History:  Diagnosis Date  . Anemia, iron deficiency   . Anhedonia   . Anxiety   . Asthma   . Asthma, chronic   . Back pain   . CAD (coronary artery disease)   . Chewing difficulty   . Chronic insomnia   . Combined hyperlipidemia   . Constipation   . Depression   . Diabetes mellitus   . Diabetes mellitus type II   . Diabetic autonomic neuropathy (HCC)   . Difficulty swallowing pills   . DM type 2 with diabetic peripheral neuropathy (HCC)   . Dry mouth   . Dyspepsia   . Dyspnea   . Elevated homocysteine (HCC)   . Essential tremor   . Fatigue   . Fatty liver   . GERD (gastroesophageal reflux disease)   . GERD (gastroesophageal reflux disease)   . Glossitis   . Goiter   . Hemorrhoids   . HLD (hyperlipidemia)   . Hyperparathyroidism , secondary, non-renal (HCC)   . Hypertension   . Hypocalcemia   . Insomnia   . Leg edema   . Leg pain   . Macular degeneration   . Nonproliferative diabetic retinopathy associated with type 2 diabetes mellitus (HCC)    RESOLVED  . Obesity, Class III, BMI 40-49.9 (morbid obesity) (HCC)   . Osteopenia   . Osteoporosis   . Pallor   . Status post gastric surgery   . Tachycardia   . Thyroiditis, autoimmune   . Vaginal dryness, menopausal   . Vitamin D deficiency     PAST SURGICAL HISTORY: Past Surgical History:  Procedure Laterality Date  . CARDIAC CATHETERIZATION     x 2  . ROUX-EN-Y PROCEDURE    . TONSILLECTOMY      SOCIAL HISTORY: Social History   Tobacco Use  . Smoking status: Never Smoker  . Smokeless tobacco: Never Used  Substance Use Topics  . Alcohol use: No    Alcohol/week: 0.0 standard drinks  . Drug use: No    FAMILY HISTORY: Family History  Problem Relation Age of Onset  . Thyroid disease Mother        Hypothyroid  . Depression Mother   . Liver disease Mother    . Obesity Mother   . Diabetes Father        T2 DM  . Hypertension Father   . Hyperlipidemia Father   . Heart disease Father   . Obesity Brother        25 0 pounds at a height of 76 inches.  . Thyroid disease Sister        Hypothyroid  . Diabetes Maternal Grandmother   . Colon cancer  Other        great grandfather  . Liver disease Maternal Aunt        NASH  . Esophageal cancer Neg Hx   . Pancreatic cancer Neg Hx   . Kidney disease Neg Hx     ROS: Review of Systems  Constitutional: Negative for weight loss.  Endo/Heme/Allergies:       Negative hypoglycemia    PHYSICAL EXAM: Blood pressure 132/81, pulse (!) 102, temperature 98 F (36.7 C), temperature source Oral, height 5\' 3"  (1.6 m), weight 173 lb (78.5 kg), last menstrual period 05/11/2010, SpO2 100 %. Body mass index is 30.65 kg/m. Physical Exam  Constitutional: She is oriented to person, place, and time. She appears well-developed and well-nourished.  Cardiovascular: Normal rate.  Pulmonary/Chest: Effort normal.  Musculoskeletal: Normal range of motion.  Neurological: She is oriented to person, place, and time.  Skin: Skin is warm and dry.  Psychiatric: She has a normal mood and affect. Her behavior is normal.  Vitals reviewed.   RECENT LABS AND TESTS: BMET    Component Value Date/Time   NA 134 (L) 12/02/2017 1100   K 4.1 12/02/2017 1100   CL 103 12/02/2017 1100   CO2 20 (L) 12/02/2017 1100   GLUCOSE 322 (H) 12/02/2017 1100   BUN 20 12/02/2017 1100   CREATININE 1.17 (H) 12/02/2017 1100   CREATININE 1.27 (H) 06/08/2017 0000   CALCIUM 8.9 12/02/2017 1100   CALCIUM 9.3 08/14/2011 1217   GFRNONAA 51 (L) 12/02/2017 1100   GFRNONAA 67 11/17/2016 0941   GFRAA 59 (L) 12/02/2017 1100   GFRAA 78 11/17/2016 0941   Lab Results  Component Value Date   HGBA1C 6.9 (A) 11/01/2017   HGBA1C 10.0 04/12/2017   HGBA1C 13.4 (H) 11/17/2016   HGBA1C 8.1 06/04/2016   HGBA1C 13.4 12/31/2015   Lab Results  Component  Value Date   INSULIN 8.1 11/16/2017   CBC    Component Value Date/Time   WBC 8.1 12/02/2017 1651   RBC 4.13 12/02/2017 1651   HGB 11.8 12/02/2017 1651   HCT 35.3 12/02/2017 1651   PLT 407 (H) 12/02/2017 1651   MCV 85.5 12/02/2017 1651   MCH 28.6 12/02/2017 1651   MCHC 33.4 12/02/2017 1651   RDW 12.6 12/02/2017 1651   LYMPHSABS 2,673 12/02/2017 1651   MONOABS 0.8 10/19/2015 1327   EOSABS 113 12/02/2017 1651   BASOSABS 57 12/02/2017 1651   Iron/TIBC/Ferritin/ %Sat    Component Value Date/Time   IRON 55 05/08/2013 1622   TIBC 357 04/14/2012 1059   IRONPCTSAT 57 (H) 04/14/2012 1059   Lipid Panel     Component Value Date/Time   CHOL 188 06/08/2017 0000   TRIG 110 06/08/2017 0000   HDL 67 06/08/2017 0000   CHOLHDL 2.8 06/08/2017 0000   VLDL 10 03/05/2017 1903   LDLCALC 100 (H) 06/08/2017 0000   Hepatic Function Panel     Component Value Date/Time   PROT 6.3 12/02/2017 1651   ALBUMIN 4.1 05/27/2016 0828   AST 13 12/02/2017 1651   ALT 13 12/02/2017 1651   ALKPHOS 85 05/27/2016 0828   BILITOT 0.3 12/02/2017 1651   BILIDIR 0.1 12/02/2017 1651   IBILI 0.2 12/02/2017 1651      Component Value Date/Time   TSH 1.80 06/08/2017 0000   TSH 0.938 03/05/2017 1903   TSH 1.54 11/17/2016 0941   TSH 2.62 05/27/2016 0828    ASSESSMENT AND PLAN: Type 2 diabetes mellitus without complication, without long-term current use  of insulin (Alexa [name])  Class 1 obesity with serious comorbidity and body mass index (BMI) of 30.0 to 30.9 in adult, unspecified obesity type  PLAN:  Diabetes II Alexa [name] has been given extensive diabetes education by myself today including ideal fasting and post-prandial blood glucose readings, individual ideal Hgb A1c goals and hypoglycemia prevention. We discussed the importance of good blood sugar control to decrease the likelihood of diabetic complications such as nephropathy, neuropathy, limb loss, blindness, coronary artery disease, and death. We discussed the  importance of intensive lifestyle modification including diet, exercise and weight loss as the first line treatment for diabetes. Alexa [name] agrees to continue her diabetes medications and she was encouraged to take her medications as prescribed. She was given general advice on healthy eating and meal planning, but I am uncertain if she is ready to make the changes required to work on weight loss. Alexa [name] agrees to follow up with our clinic in 2 weeks with [name], [place].  I spent > than 50% of the 25 minute visit on counseling as documented in the note.  Obesity Desiraye is not currently in the action stage of change. As such, her goal is to maintain weight for now She has agreed to portion control better and make smarter food choices, such as increase vegetables and decrease simple carbohydrates  Alexa [name] has been instructed to work up to a goal of 150 minutes of combined cardio and strengthening exercise per week for weight loss and overall health benefits. We discussed the following Behavioral Modification Strategies today: work on meal planning and easy cooking plans Alexa [name] is not in the action stage of change. Her goal is to maintain her weight until her life calms down and she is able to concentrate on herself.  Alexa [name] has agreed to follow up with our clinic in 2 weeks. She was informed of the importance of frequent follow up visits to maximize her success with intensive lifestyle modifications for her multiple health conditions.   OBESITY BEHAVIORAL INTERVENTION VISIT  Today's visit was # 2   Starting weight: 169 lbs Starting date: 11/16/17 Today's weight : 173 lbs Today's date: 12/02/2017 Total lbs lost to date: 0    ASK: We discussed the diagnosis of obesity with Alexa [name] today and Alexa [name] agreed to give [name] permission to discuss obesity behavioral modification therapy today.  ASSESS: Alexa [name] has the diagnosis of obesity and her BMI today is 30.65 Alexa [name] is not in the action  stage of change   ADVISE: Alexa [name] was educated on the multiple health risks of obesity as well as the benefit of weight loss to improve her health. She was advised of the need for long term treatment and the importance of lifestyle modifications to improve her current health and to decrease her risk of future health problems.  AGREE: Multiple dietary modification options and treatment options were discussed and  Alexa [name] agreed to follow the recommendations documented in the above note.  ARRANGE: Alexa [name] was educated on the importance of frequent visits to treat obesity as outlined per CMS and USPSTF guidelines and agreed to schedule her next follow up appointment today.  I, Trixie Dredge, am acting as transcriptionist for Dennard Nip, MD  I have reviewed the above documentation for accuracy and completeness, and I agree with the above. -Dennard Nip, MD

## 2017-12-07 ENCOUNTER — Other Ambulatory Visit (INDEPENDENT_AMBULATORY_CARE_PROVIDER_SITE_OTHER): Payer: Self-pay | Admitting: "Endocrinology

## 2017-12-07 DIAGNOSIS — E1165 Type 2 diabetes mellitus with hyperglycemia: Secondary | ICD-10-CM

## 2017-12-07 DIAGNOSIS — IMO0002 Reserved for concepts with insufficient information to code with codable children: Secondary | ICD-10-CM

## 2017-12-07 DIAGNOSIS — E1110 Type 2 diabetes mellitus with ketoacidosis without coma: Secondary | ICD-10-CM

## 2017-12-07 DIAGNOSIS — E063 Autoimmune thyroiditis: Secondary | ICD-10-CM

## 2017-12-07 MED FILL — CONTRAVE ER 8-90 MG TABLET: 8-90 | 90 days supply | Qty: 360 | Fill #0

## 2017-12-07 MED FILL — PANTOPRAZOLE SOD DR 40 MG T: 40 | 90 days supply | Qty: 90 | Fill #2

## 2017-12-07 MED FILL — TELMISARTAN 40 MG TABLET: 40 | 90 days supply | Qty: 90 | Fill #0

## 2017-12-08 ENCOUNTER — Telehealth (INDEPENDENT_AMBULATORY_CARE_PROVIDER_SITE_OTHER): Payer: Self-pay

## 2017-12-08 MED FILL — ESCITALOPRAM 20 MG TABLET: 20 | 90 days supply | Qty: 90 | Fill #0

## 2017-12-08 MED FILL — LEVOTHYROXINE 25 MCG TABLET: 25 | 90 days supply | Qty: 90 | Fill #0

## 2017-12-08 MED FILL — metFORMIN HCL 1000 MG TABS: 1000 | 90 days supply | Qty: 180 | Fill #0

## 2017-12-08 MED FILL — VICTOZA 18 MG/3 ML INJECT P: 18 | 90 days supply | Qty: 27 | Fill #0

## 2017-12-08 NOTE — Telephone Encounter (Signed)
RX refill request for Lexapro came in for patient. This medical assistant asked Dr. Tobe Sos if he would continue to provide refills for this patient for this medication.  Provider gave a verbal okay for this medication to be refilled. This message will be routed to provider.

## 2017-12-09 ENCOUNTER — Other Ambulatory Visit: Payer: Self-pay | Admitting: Internal Medicine

## 2017-12-09 MED FILL — PREMARIN VAGINAL CREAM-APPL: 0.625 | 15 days supply | Qty: 30 | Fill #0

## 2017-12-13 ENCOUNTER — Other Ambulatory Visit (HOSPITAL_COMMUNITY)
Admission: RE | Admit: 2017-12-13 | Discharge: 2017-12-13 | Disposition: A | Discharge status: Home / Self Care | Payer: 59 | Source: Ambulatory Visit | Attending: Internal Medicine | Admitting: Internal Medicine

## 2017-12-13 ENCOUNTER — Encounter: Payer: Self-pay | Admitting: Internal Medicine

## 2017-12-13 ENCOUNTER — Ambulatory Visit (INDEPENDENT_AMBULATORY_CARE_PROVIDER_SITE_OTHER): Payer: 59 | Admitting: Internal Medicine

## 2017-12-13 VITALS — BP 110/76 | HR 79 | Ht 63.0 in | Wt 173.0 lb

## 2017-12-13 DIAGNOSIS — E119 Type 2 diabetes mellitus without complications: Secondary | ICD-10-CM

## 2017-12-13 DIAGNOSIS — Z6830 Body mass index (BMI) 30.0-30.9, adult: Secondary | ICD-10-CM

## 2017-12-13 DIAGNOSIS — I519 Heart disease, unspecified: Secondary | ICD-10-CM

## 2017-12-13 DIAGNOSIS — E7849 Other hyperlipidemia: Secondary | ICD-10-CM

## 2017-12-13 DIAGNOSIS — Z124 Encounter for screening for malignant neoplasm of cervix: Secondary | ICD-10-CM

## 2017-12-13 DIAGNOSIS — Z62810 Personal history of physical and sexual abuse in childhood: Secondary | ICD-10-CM | POA: Diagnosis not present

## 2017-12-13 DIAGNOSIS — Z0000 Encounter for general adult medical examination without abnormal findings: Secondary | ICD-10-CM

## 2017-12-13 DIAGNOSIS — K5904 Chronic idiopathic constipation: Secondary | ICD-10-CM

## 2017-12-13 DIAGNOSIS — F39 Unspecified mood [affective] disorder: Secondary | ICD-10-CM | POA: Diagnosis not present

## 2017-12-13 DIAGNOSIS — N951 Menopausal and female climacteric states: Secondary | ICD-10-CM | POA: Diagnosis not present

## 2017-12-13 DIAGNOSIS — Z9884 Bariatric surgery status: Secondary | ICD-10-CM

## 2017-12-13 DIAGNOSIS — Z8709 Personal history of other diseases of the respiratory system: Secondary | ICD-10-CM | POA: Diagnosis not present

## 2017-12-13 DIAGNOSIS — Z8739 Personal history of other diseases of the musculoskeletal system and connective tissue: Secondary | ICD-10-CM

## 2017-12-13 DIAGNOSIS — E669 Obesity, unspecified: Secondary | ICD-10-CM | POA: Diagnosis not present

## 2017-12-13 DIAGNOSIS — I5189 Other ill-defined heart diseases: Secondary | ICD-10-CM

## 2017-12-13 LAB — POCT URINALYSIS DIPSTICK
Appearance: NEGATIVE
BILIRUBIN UA: NEGATIVE
Glucose, UA: NEGATIVE
KETONES UA: NEGATIVE
Leukocytes, UA: NEGATIVE
Nitrite, UA: NEGATIVE
Odor: NEGATIVE
Protein, UA: NEGATIVE
RBC UA: NEGATIVE
SPEC GRAV UA: 1.015 (ref 1.010–1.025)
UROBILINOGEN UA: 0.2 U/dL
pH, UA: 6.5 (ref 5.0–8.0)

## 2017-12-13 MED ORDER — ALBUTEROL SULFATE (2.5 MG/3ML) 0.083% IN NEBU: 2.5000 mg | INHALATION_SOLUTION | Freq: Four times a day (QID) | RESPIRATORY_TRACT | 1 refills | Status: DC | PRN

## 2017-12-13 MED ORDER — CYCLOBENZAPRINE HCL 10 MG PO TABS: 10.0000 mg | ORAL_TABLET | Freq: Three times a day (TID) | ORAL | 0 refills | Status: DC | PRN

## 2017-12-13 MED ORDER — TRAMADOL HCL 50 MG PO TABS: 50.0000 mg | ORAL_TABLET | Freq: Every day | ORAL | 1 refills | Status: DC | PRN

## 2017-12-13 MED ORDER — ALBUTEROL SULFATE HFA 108 (90 BASE) MCG/ACT IN AERS: 2.0000 | INHALATION_SPRAY | Freq: Four times a day (QID) | RESPIRATORY_TRACT | 11 refills | Status: DC | PRN

## 2017-12-13 MED FILL — VENTOLIN HFA 90 MCG INHALER: 108 (90 BAS | 25 days supply | Qty: 18 | Fill #0

## 2017-12-13 MED FILL — traMADol HCL 50 MG TABS: 50 | 30 days supply | Qty: 30 | Fill #0

## 2017-12-13 MED FILL — CYCLOBENZAPRINE 10 MG TAB: 10 | 30 days supply | Qty: 90 | Fill #0

## 2017-12-13 MED FILL — ALBUTEROL 0.083% INHAL SOLN: (2.5 MG/3ML | 13 days supply | Qty: 150 | Fill #0
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
## 2017-12-14 ENCOUNTER — Encounter (HOSPITAL_BASED_OUTPATIENT_CLINIC_OR_DEPARTMENT_OTHER): Payer: Self-pay | Admitting: Anesthesiology

## 2017-12-14 ENCOUNTER — Ambulatory Visit (HOSPITAL_COMMUNITY)
Admission: RE | Admit: 2017-12-14 | Discharge: 2017-12-15 | Disposition: A | Discharge status: Home / Self Care | Payer: 59 | Source: Ambulatory Visit | Attending: Plastic Surgery | Admitting: Plastic Surgery

## 2017-12-14 ENCOUNTER — Other Ambulatory Visit: Payer: Self-pay

## 2017-12-14 ENCOUNTER — Ambulatory Visit (HOSPITAL_BASED_OUTPATIENT_CLINIC_OR_DEPARTMENT_OTHER): Payer: 59 | Admitting: Anesthesiology

## 2017-12-14 ENCOUNTER — Encounter (HOSPITAL_BASED_OUTPATIENT_CLINIC_OR_DEPARTMENT_OTHER)
Admission: RE | Disposition: A | Discharge status: Home / Self Care | Payer: Self-pay | Source: Ambulatory Visit | Attending: Plastic Surgery

## 2017-12-14 DIAGNOSIS — Z7984 Long term (current) use of oral hypoglycemic drugs: Secondary | ICD-10-CM | POA: Diagnosis not present

## 2017-12-14 DIAGNOSIS — Z79899 Other long term (current) drug therapy: Secondary | ICD-10-CM | POA: Diagnosis not present

## 2017-12-14 DIAGNOSIS — K219 Gastro-esophageal reflux disease without esophagitis: Secondary | ICD-10-CM | POA: Diagnosis not present

## 2017-12-14 DIAGNOSIS — F419 Anxiety disorder, unspecified: Secondary | ICD-10-CM | POA: Diagnosis not present

## 2017-12-14 DIAGNOSIS — J45909 Unspecified asthma, uncomplicated: Secondary | ICD-10-CM | POA: Insufficient documentation

## 2017-12-14 DIAGNOSIS — Z791 Long term (current) use of non-steroidal anti-inflammatories (NSAID): Secondary | ICD-10-CM | POA: Insufficient documentation

## 2017-12-14 DIAGNOSIS — L304 Erythema intertrigo: Secondary | ICD-10-CM | POA: Insufficient documentation

## 2017-12-14 DIAGNOSIS — N6011 Diffuse cystic mastopathy of right breast: Secondary | ICD-10-CM | POA: Diagnosis not present

## 2017-12-14 DIAGNOSIS — Z9884 Bariatric surgery status: Secondary | ICD-10-CM | POA: Diagnosis not present

## 2017-12-14 DIAGNOSIS — M542 Cervicalgia: Secondary | ICD-10-CM | POA: Insufficient documentation

## 2017-12-14 DIAGNOSIS — R200 Anesthesia of skin: Secondary | ICD-10-CM | POA: Diagnosis not present

## 2017-12-14 DIAGNOSIS — Z6831 Body mass index (BMI) 31.0-31.9, adult: Secondary | ICD-10-CM | POA: Insufficient documentation

## 2017-12-14 DIAGNOSIS — I10 Essential (primary) hypertension: Secondary | ICD-10-CM | POA: Insufficient documentation

## 2017-12-14 DIAGNOSIS — R21 Rash and other nonspecific skin eruption: Secondary | ICD-10-CM | POA: Diagnosis not present

## 2017-12-14 DIAGNOSIS — N62 Hypertrophy of breast: Secondary | ICD-10-CM | POA: Diagnosis not present

## 2017-12-14 DIAGNOSIS — F329 Major depressive disorder, single episode, unspecified: Secondary | ICD-10-CM | POA: Diagnosis not present

## 2017-12-14 DIAGNOSIS — G8929 Other chronic pain: Secondary | ICD-10-CM | POA: Insufficient documentation

## 2017-12-14 DIAGNOSIS — E669 Obesity, unspecified: Secondary | ICD-10-CM | POA: Insufficient documentation

## 2017-12-14 DIAGNOSIS — M549 Dorsalgia, unspecified: Secondary | ICD-10-CM | POA: Diagnosis not present

## 2017-12-14 DIAGNOSIS — E119 Type 2 diabetes mellitus without complications: Secondary | ICD-10-CM | POA: Insufficient documentation

## 2017-12-14 DIAGNOSIS — M793 Panniculitis, unspecified: Secondary | ICD-10-CM | POA: Insufficient documentation

## 2017-12-14 DIAGNOSIS — N6012 Diffuse cystic mastopathy of left breast: Secondary | ICD-10-CM | POA: Diagnosis not present

## 2017-12-14 HISTORY — PX: BREAST REDUCTION SURGERY: SHX8

## 2017-12-14 LAB — CYTOLOGY - PAP
Diagnosis: NEGATIVE
HPV: NOT DETECTED

## 2017-12-14 LAB — GLUCOSE, CAPILLARY
GLUCOSE-CAPILLARY: 278 mg/dL — AB (ref 70–99)
Glucose-Capillary: 131 mg/dL — ABNORMAL HIGH (ref 70–99)
Glucose-Capillary: 166 mg/dL — ABNORMAL HIGH (ref 70–99)
Glucose-Capillary: 258 mg/dL — ABNORMAL HIGH (ref 70–99)

## 2017-12-14 LAB — HEPATITIS C ANTIBODY
Hepatitis C Ab: NONREACTIVE
SIGNAL TO CUT-OFF: 0.05 (ref <1.00)

## 2017-12-14 SURGERY — MAMMOPLASTY, REDUCTION
Anesthesia: General | Site: Breast | Laterality: Bilateral

## 2017-12-14 MED ORDER — ALBUTEROL SULFATE (2.5 MG/3ML) 0.083% IN NEBU: 2.5000 mg | INHALATION_SOLUTION | Freq: Four times a day (QID) | RESPIRATORY_TRACT | Status: DC | PRN

## 2017-12-14 MED ORDER — INSULIN ASPART 100 UNIT/ML ~~LOC~~ SOLN
0.0000 [IU] | Freq: Three times a day (TID) | SUBCUTANEOUS | Status: DC
  Administered 2017-12-14: 8 [IU] via SUBCUTANEOUS
  Filled 2017-12-14: qty 1

## 2017-12-14 MED ORDER — IBUPROFEN 600 MG PO TABS
600.0000 mg | ORAL_TABLET | Freq: Every day | ORAL | Status: DC | PRN
  Administered 2017-12-14: 600 mg via ORAL
  Filled 2017-12-14: qty 1

## 2017-12-14 MED ORDER — ONDANSETRON HCL 4 MG/2ML IJ SOLN: 4.0000 mg | Freq: Four times a day (QID) | INTRAMUSCULAR | Status: DC | PRN

## 2017-12-14 MED ORDER — TRAMADOL HCL 50 MG PO TABS
50.0000 mg | ORAL_TABLET | Freq: Every day | ORAL | Status: DC | PRN
  Administered 2017-12-15: 50 mg via ORAL
  Filled 2017-12-14: qty 1

## 2017-12-14 MED ORDER — HYDROMORPHONE HCL 1 MG/ML IJ SOLN
INTRAMUSCULAR | Status: AC
  Filled 2017-12-14: qty 0.5

## 2017-12-14 MED ORDER — 0.9 % SODIUM CHLORIDE (POUR BTL) OPTIME
TOPICAL | Status: DC | PRN
  Administered 2017-12-14: 700 mL

## 2017-12-14 MED ORDER — ONDANSETRON HCL 4 MG/2ML IJ SOLN
INTRAMUSCULAR | Status: DC | PRN
  Administered 2017-12-14: 4 mg via INTRAVENOUS

## 2017-12-14 MED ORDER — SUGAMMADEX SODIUM 200 MG/2ML IV SOLN
INTRAVENOUS | Status: AC
  Filled 2017-12-14: qty 2

## 2017-12-14 MED ORDER — IRBESARTAN 150 MG PO TABS: 150.0000 mg | ORAL_TABLET | Freq: Every day | ORAL | Status: DC

## 2017-12-14 MED ORDER — SUGAMMADEX SODIUM 200 MG/2ML IV SOLN
INTRAVENOUS | Status: DC | PRN
  Administered 2017-12-14: 200 mg via INTRAVENOUS

## 2017-12-14 MED ORDER — SCOPOLAMINE 1 MG/3DAYS TD PT72: 1.0000 | MEDICATED_PATCH | Freq: Once | TRANSDERMAL | Status: DC | PRN

## 2017-12-14 MED ORDER — ENOXAPARIN SODIUM 40 MG/0.4ML ~~LOC~~ SOLN: 40.0000 mg | SUBCUTANEOUS | Status: DC

## 2017-12-14 MED ORDER — SODIUM CHLORIDE (PF) 0.9 % IJ SOLN
INTRAMUSCULAR | Status: AC
  Filled 2017-12-14: qty 20

## 2017-12-14 MED ORDER — PROMETHAZINE HCL 25 MG/ML IJ SOLN
6.2500 mg | INTRAMUSCULAR | Status: DC | PRN
  Administered 2017-12-14: 6.25 mg via INTRAVENOUS

## 2017-12-14 MED ORDER — CELECOXIB 200 MG PO CAPS
ORAL_CAPSULE | ORAL | Status: AC
  Filled 2017-12-14: qty 1

## 2017-12-14 MED ORDER — NALTREXONE-BUPROPION HCL ER 8-90 MG PO TB12: 2.0000 | ORAL_TABLET | Freq: Two times a day (BID) | ORAL | Status: DC

## 2017-12-14 MED ORDER — ESCITALOPRAM OXALATE 20 MG PO TABS: 20.0000 mg | ORAL_TABLET | Freq: Every day | ORAL | Status: DC

## 2017-12-14 MED ORDER — ONDANSETRON HCL 4 MG/2ML IJ SOLN
INTRAMUSCULAR | Status: AC
  Filled 2017-12-14: qty 2

## 2017-12-14 MED ORDER — CELECOXIB 200 MG PO CAPS
200.0000 mg | ORAL_CAPSULE | ORAL | Status: AC
  Administered 2017-12-14: 200 mg via ORAL

## 2017-12-14 MED ORDER — ROCURONIUM BROMIDE 50 MG/5ML IV SOSY
PREFILLED_SYRINGE | INTRAVENOUS | Status: AC
  Filled 2017-12-14: qty 5

## 2017-12-14 MED ORDER — HYDROMORPHONE HCL 1 MG/ML IJ SOLN: 0.5000 mg | INTRAMUSCULAR | Status: DC | PRN

## 2017-12-14 MED ORDER — LIDOCAINE HCL (CARDIAC) PF 100 MG/5ML IV SOSY
PREFILLED_SYRINGE | INTRAVENOUS | Status: DC | PRN
  Administered 2017-12-14: 40 mg via INTRAVENOUS

## 2017-12-14 MED ORDER — ACETAMINOPHEN 500 MG PO TABS
1000.0000 mg | ORAL_TABLET | ORAL | Status: AC
  Administered 2017-12-14: 1000 mg via ORAL

## 2017-12-14 MED ORDER — HYDROCODONE-ACETAMINOPHEN 5-325 MG PO TABS
1.0000 | ORAL_TABLET | ORAL | Status: DC | PRN
  Administered 2017-12-14 – 2017-12-15 (×2): 2 via ORAL
  Filled 2017-12-14: qty 1
  Filled 2017-12-14 (×2): qty 2

## 2017-12-14 MED ORDER — ONDANSETRON 4 MG PO TBDP: 4.0000 mg | ORAL_TABLET | Freq: Four times a day (QID) | ORAL | Status: DC | PRN

## 2017-12-14 MED ORDER — MIDAZOLAM HCL 2 MG/2ML IJ SOLN
INTRAMUSCULAR | Status: AC
  Filled 2017-12-14: qty 2

## 2017-12-14 MED ORDER — EPHEDRINE SULFATE-NACL 50-0.9 MG/10ML-% IV SOSY
PREFILLED_SYRINGE | INTRAVENOUS | Status: DC | PRN
  Administered 2017-12-14 (×3): 10 mg via INTRAVENOUS

## 2017-12-14 MED ORDER — MIDAZOLAM HCL 2 MG/2ML IJ SOLN
1.0000 mg | INTRAMUSCULAR | Status: DC | PRN
  Administered 2017-12-14: 2 mg via INTRAVENOUS

## 2017-12-14 MED ORDER — SCOPOLAMINE 1 MG/3DAYS TD PT72
MEDICATED_PATCH | TRANSDERMAL | Status: AC
  Filled 2017-12-14: qty 1

## 2017-12-14 MED ORDER — CEFAZOLIN SODIUM-DEXTROSE 2-4 GM/100ML-% IV SOLN
2.0000 g | INTRAVENOUS | Status: AC
  Administered 2017-12-14: 2 g via INTRAVENOUS

## 2017-12-14 MED ORDER — GABAPENTIN 300 MG PO CAPS
300.0000 mg | ORAL_CAPSULE | ORAL | Status: AC
  Administered 2017-12-14: 300 mg via ORAL

## 2017-12-14 MED ORDER — ACETAMINOPHEN 500 MG PO TABS
ORAL_TABLET | ORAL | Status: AC
  Filled 2017-12-14: qty 2

## 2017-12-14 MED ORDER — SODIUM CHLORIDE 0.9 % IV SOLN
INTRAVENOUS | Status: DC | PRN
  Administered 2017-12-14: 50 ug/min via INTRAVENOUS

## 2017-12-14 MED ORDER — ROCURONIUM BROMIDE 50 MG/5ML IV SOSY
PREFILLED_SYRINGE | INTRAVENOUS | Status: AC
  Filled 2017-12-14: qty 10

## 2017-12-14 MED ORDER — CHLORHEXIDINE GLUCONATE CLOTH 2 % EX PADS: 6.0000 | MEDICATED_PAD | Freq: Once | CUTANEOUS | Status: DC

## 2017-12-14 MED ORDER — SODIUM CHLORIDE 0.9 % IV SOLN
INTRAVENOUS | Status: DC | PRN
  Administered 2017-12-14: 40 mL

## 2017-12-14 MED ORDER — LACTATED RINGERS IV SOLN
INTRAVENOUS | Status: DC
  Administered 2017-12-14 (×4): via INTRAVENOUS

## 2017-12-14 MED ORDER — ALBUTEROL SULFATE HFA 108 (90 BASE) MCG/ACT IN AERS: 2.0000 | INHALATION_SPRAY | Freq: Four times a day (QID) | RESPIRATORY_TRACT | Status: DC | PRN

## 2017-12-14 MED ORDER — LIDOCAINE 2% (20 MG/ML) 5 ML SYRINGE
INTRAMUSCULAR | Status: AC
  Filled 2017-12-14: qty 5

## 2017-12-14 MED ORDER — PROMETHAZINE HCL 25 MG/ML IJ SOLN
INTRAMUSCULAR | Status: AC
  Filled 2017-12-14: qty 1

## 2017-12-14 MED ORDER — DEXAMETHASONE SODIUM PHOSPHATE 10 MG/ML IJ SOLN
INTRAMUSCULAR | Status: AC
  Filled 2017-12-14: qty 1

## 2017-12-14 MED ORDER — CARVEDILOL 3.125 MG PO TABS: 3.1250 mg | ORAL_TABLET | Freq: Two times a day (BID) | ORAL | Status: DC

## 2017-12-14 MED ORDER — GABAPENTIN 300 MG PO CAPS
ORAL_CAPSULE | ORAL | Status: AC
  Filled 2017-12-14: qty 1

## 2017-12-14 MED ORDER — LEVOTHYROXINE SODIUM 25 MCG PO TABS: 25.0000 ug | ORAL_TABLET | Freq: Every day | ORAL | Status: DC

## 2017-12-14 MED ORDER — POTASSIUM CHLORIDE IN NACL 20-0.45 MEQ/L-% IV SOLN: INTRAVENOUS | Status: DC

## 2017-12-14 MED ORDER — FENTANYL CITRATE (PF) 100 MCG/2ML IJ SOLN
50.0000 ug | INTRAMUSCULAR | Status: AC | PRN
  Administered 2017-12-14: 50 ug via INTRAVENOUS
  Administered 2017-12-14: 100 ug via INTRAVENOUS
  Administered 2017-12-14: 50 ug via INTRAVENOUS

## 2017-12-14 MED ORDER — PROPOFOL 10 MG/ML IV BOLUS
INTRAVENOUS | Status: DC | PRN
  Administered 2017-12-14: 150 mg via INTRAVENOUS

## 2017-12-14 MED ORDER — ROCURONIUM BROMIDE 100 MG/10ML IV SOLN
INTRAVENOUS | Status: DC | PRN
  Administered 2017-12-14 (×2): 10 mg via INTRAVENOUS
  Administered 2017-12-14: 50 mg via INTRAVENOUS
  Administered 2017-12-14: 20 mg via INTRAVENOUS

## 2017-12-14 MED ORDER — FENTANYL CITRATE (PF) 100 MCG/2ML IJ SOLN
INTRAMUSCULAR | Status: AC
  Filled 2017-12-14: qty 2

## 2017-12-14 MED ORDER — SCOPOLAMINE 1 MG/3DAYS TD PT72
1.0000 | MEDICATED_PATCH | Freq: Once | TRANSDERMAL | Status: DC
  Administered 2017-12-14: 1.5 mg via TRANSDERMAL

## 2017-12-14 MED ORDER — HYDROMORPHONE HCL 1 MG/ML IJ SOLN
0.2500 mg | INTRAMUSCULAR | Status: DC | PRN
  Administered 2017-12-14: 0.25 mg via INTRAVENOUS
  Administered 2017-12-14: 0.5 mg via INTRAVENOUS

## 2017-12-14 MED ORDER — PROPOFOL 500 MG/50ML IV EMUL
INTRAVENOUS | Status: AC
  Filled 2017-12-14: qty 50

## 2017-12-14 MED ORDER — MIDAZOLAM HCL 2 MG/2ML IJ SOLN: 0.5000 mg | Freq: Once | INTRAMUSCULAR | Status: DC | PRN

## 2017-12-14 MED ORDER — CEFAZOLIN SODIUM-DEXTROSE 2-4 GM/100ML-% IV SOLN
INTRAVENOUS | Status: AC
  Filled 2017-12-14: qty 100

## 2017-12-14 MED ORDER — CYCLOBENZAPRINE HCL 10 MG PO TABS: 10.0000 mg | ORAL_TABLET | Freq: Three times a day (TID) | ORAL | Status: DC | PRN

## 2017-12-14 MED ORDER — BUPIVACAINE LIPOSOME 1.3 % IJ SUSP
INTRAMUSCULAR | Status: AC
  Filled 2017-12-14: qty 20

## 2017-12-14 MED ORDER — MEPERIDINE HCL 25 MG/ML IJ SOLN: 6.2500 mg | INTRAMUSCULAR | Status: DC | PRN

## 2017-12-14 MED ORDER — POTASSIUM CHLORIDE IN NACL 20-0.9 MEQ/L-% IV SOLN
INTRAVENOUS | Status: DC
  Administered 2017-12-14: 15:00:00 via INTRAVENOUS
  Filled 2017-12-14: qty 1000

## 2017-12-14 MED ORDER — DEXAMETHASONE SODIUM PHOSPHATE 4 MG/ML IJ SOLN
INTRAMUSCULAR | Status: DC | PRN
  Administered 2017-12-14: 10 mg via INTRAVENOUS

## 2017-12-14 MED ORDER — PANTOPRAZOLE SODIUM 40 MG PO TBEC: 40.0000 mg | DELAYED_RELEASE_TABLET | Freq: Every day | ORAL | Status: DC

## 2017-12-14 MED ORDER — PROPOFOL 500 MG/50ML IV EMUL
INTRAVENOUS | Status: DC | PRN
  Administered 2017-12-14: 25 ug/kg/min via INTRAVENOUS

## 2017-12-14 SURGICAL SUPPLY — 52 items
ADH SKN CLS APL DERMABOND .7 (GAUZE/BANDAGES/DRESSINGS) ×1
BINDER BREAST XXLRG (GAUZE/BANDAGES/DRESSINGS) ×1 IMPLANT
BLADE SURG 10 STRL SS (BLADE) ×9 IMPLANT
BNDG GAUZE ELAST 4 BULKY (GAUZE/BANDAGES/DRESSINGS) ×4 IMPLANT
CANISTER SUCT 1200ML W/VALVE (MISCELLANEOUS) ×2 IMPLANT
CHLORAPREP W/TINT 26ML (MISCELLANEOUS) ×4 IMPLANT
CLIP VESOCCLUDE MED 6/CT (CLIP) IMPLANT
COVER BACK TABLE 60X90IN (DRAPES) ×2 IMPLANT
COVER MAYO STAND STRL (DRAPES) ×2 IMPLANT
COVER WAND RF STERILE (DRAPES) IMPLANT
DERMABOND ADVANCED (GAUZE/BANDAGES/DRESSINGS) ×1
DERMABOND ADVANCED .7 DNX12 (GAUZE/BANDAGES/DRESSINGS) ×1 IMPLANT
DRAIN CHANNEL 15F RND FF W/TCR (WOUND CARE) ×4 IMPLANT
DRAPE TOP ARMCOVERS (MISCELLANEOUS) ×2 IMPLANT
DRAPE U-SHAPE 76X120 STRL (DRAPES) ×2 IMPLANT
DRSG PAD ABDOMINAL 8X10 ST (GAUZE/BANDAGES/DRESSINGS) ×4 IMPLANT
ELECT COATED BLADE 2.86 ST (ELECTRODE) ×2 IMPLANT
ELECT REM PT RETURN 9FT ADLT (ELECTROSURGICAL) ×2
ELECTRODE REM PT RTRN 9FT ADLT (ELECTROSURGICAL) ×1 IMPLANT
EVACUATOR SILICONE 100CC (DRAIN) ×4 IMPLANT
GLOVE BIO SURGEON STRL SZ 6 (GLOVE) ×4 IMPLANT
GLOVE BIO SURGEON STRL SZ7 (GLOVE) ×1 IMPLANT
GLOVE BIOGEL PI IND STRL 7.0 (GLOVE) IMPLANT
GLOVE BIOGEL PI IND STRL 7.5 (GLOVE) ×1 IMPLANT
GLOVE BIOGEL PI INDICATOR 7.0 (GLOVE) ×1
GLOVE BIOGEL PI INDICATOR 7.5 (GLOVE) ×1
GOWN STRL REUS W/ TWL LRG LVL3 (GOWN DISPOSABLE) ×2 IMPLANT
GOWN STRL REUS W/TWL LRG LVL3 (GOWN DISPOSABLE) ×4
MARKER SKIN DUAL TIP RULER LAB (MISCELLANEOUS) IMPLANT
NEEDLE HYPO 25X1 1.5 SAFETY (NEEDLE) ×2 IMPLANT
NS IRRIG 1000ML POUR BTL (IV SOLUTION) ×2 IMPLANT
PACK BASIN DAY SURGERY FS (CUSTOM PROCEDURE TRAY) ×2 IMPLANT
PENCIL BUTTON HOLSTER BLD 10FT (ELECTRODE) ×2 IMPLANT
PIN SAFETY STERILE (MISCELLANEOUS) ×2 IMPLANT
SHEET MEDIUM DRAPE 40X70 STRL (DRAPES) ×2 IMPLANT
SLEEVE SCD COMPRESS KNEE MED (MISCELLANEOUS) ×2 IMPLANT
SPONGE LAP 18X18 RF (DISPOSABLE) ×7 IMPLANT
STAPLER VISISTAT 35W (STAPLE) ×6 IMPLANT
SUT ETHILON 2 0 FS 18 (SUTURE) ×4 IMPLANT
SUT MNCRL AB 4-0 PS2 18 (SUTURE) ×8 IMPLANT
SUT PDS AB 2-0 CT2 27 (SUTURE) IMPLANT
SUT VIC AB 3-0 PS1 18 (SUTURE) ×12
SUT VIC AB 3-0 PS1 18XBRD (SUTURE) ×4 IMPLANT
SUT VICRYL 4-0 PS2 18IN ABS (SUTURE) ×4 IMPLANT
SYR BULB IRRIGATION 50ML (SYRINGE) ×2 IMPLANT
SYR CONTROL 10ML LL (SYRINGE) ×1 IMPLANT
TAPE MEASURE VINYL STERILE (MISCELLANEOUS) IMPLANT
TOWEL GREEN STERILE FF (TOWEL DISPOSABLE) ×4 IMPLANT
TRAY FOLEY W/BAG SLVR 14FR LF (SET/KITS/TRAYS/PACK) IMPLANT
TUBE CONNECTING 20X1/4 (TUBING) ×2 IMPLANT
UNDERPAD 30X30 (UNDERPADS AND DIAPERS) ×4 IMPLANT
YANKAUER SUCT BULB TIP NO VENT (SUCTIONS) ×2 IMPLANT

## 2017-12-14 NOTE — Transfer of Care (Signed)
Immediate Anesthesia Transfer of Care Note  Patient: Alexa [name]  Procedure(s) Performed: BILATERAL MAMMARY REDUCTION  (BREAST) (Bilateral Breast)  Patient Location: PACU  Anesthesia Type:General  Level of Consciousness: awake, sedated and patient cooperative  Airway & Oxygen Therapy: Patient Spontanous Breathing and Patient connected to face mask oxygen  Post-op Assessment: Report given to RN and Post -op Vital signs reviewed and stable  Post vital signs: Reviewed and stable  Last Vitals:  Vitals Value Taken Time  BP 140/86 12/14/2017 11:13 AM  Temp    Pulse 105 12/14/2017 11:15 AM  Resp    SpO2 100 % 12/14/2017 11:15 AM  Vitals shown include unvalidated device data.  Last Pain:  Vitals:   12/14/17 0709  TempSrc: Oral  PainSc: 0-No pain      Patients Stated Pain Goal: 2 (83/81/84 0375)  Complications: No apparent anesthesia complications

## 2017-12-14 NOTE — Patient Instructions (Signed)
Good luck with surgery tomorrow.  Please have Dr. Tobe Sos check B12 level in 3 months.  Please try to keep on track with diabetic control.  Continue seeing Dr. Leafy Ro and Dr. Tobe Sos.  Follow-up in 1 year or as needed.

## 2017-12-14 NOTE — Anesthesia Postprocedure Evaluation (Signed)
Anesthesia Post Note  Patient: KAMBRIA GRIMA  Procedure(s) Performed: BILATERAL MAMMARY REDUCTION  (BREAST) (Bilateral Breast)     Patient location during evaluation: PACU Anesthesia Type: General Level of consciousness: awake and alert, oriented and patient cooperative Pain management: pain level controlled Vital Signs Assessment: post-procedure vital signs reviewed and stable Respiratory status: spontaneous breathing, nonlabored ventilation and respiratory function stable Cardiovascular status: blood pressure returned to baseline and stable Postop Assessment: no apparent nausea or vomiting Anesthetic complications: no    Last Vitals:  Vitals:   12/14/17 1200 12/14/17 1215  BP: 131/70 126/71  Pulse: 97 98  Resp: 18 18  Temp:    SpO2: 100% 99%    Last Pain:  Vitals:   12/14/17 1215  TempSrc:   PainSc: 3                  Veronica Fretz,E. Belita Warsame

## 2017-12-14 NOTE — Op Note (Signed)
Operative Note   DATE OF OPERATION: 11.19.19  LOCATION: [facility]-outpatient  SURGICAL DIVISION: Plastic Surgery  PREOPERATIVE DIAGNOSES:  1. Macromastia 2. Chronic neck and back pain 3. Intertrigo  POSTOPERATIVE DIAGNOSES:  same  PROCEDURE:  Bilateral breast reduction  SURGEON: Irene Limbo MD MBA  ASSISTANT: none  ANESTHESIA:  General.   EBL: 888 ml  COMPLICATIONS: None immediate.   INDICATIONS FOR PROCEDURE:  The patient, Alexa [name], is a 57 y.o. female born on 09-08-1960, is here for breast reduction in setting macromastia with chronic back pain that has been unrelieved by conservative measures.   FINDINGS: right reduction 531 g Left 622 g  DESCRIPTION OF PROCEDURE:  The patient was marked standing in the preoperative area to mark sternal notch, chest midline, anterior axillary lines, inframammary folds. The location of new nipple areolar complex was marked at level of on inframammary fold on anterior surface breast by palpation. This was marked symmetric over bilateral breasts. With aid of Wise pattern marker, location of new nipple areolar complex and vertical limbs (8cm) were markedby displacement of breasts along meridian.The patient was taken to the operating room. SCDs were placedand IV antibiotics were given. The patient's operative site was prepped and draped in a sterile fashion. A time out was performed and all information was confirmed to be correct.   Over left breast, superomedial pedicle marked and nipple areolar complex marked with44mm diameter marker. Pedicle deepithlialized and developedto chest wall. Breast tissue resected over lower pole. Medial and lateral flaps developed.Additional superior pole and lateral chest wall tissue excised.Breast tailor tacked closed.  I then directed attention to right breast where superomedial pedicle designed. The pedicle was deepithelialized. Pedicle developed until tension free rotation was  possible.Breast tissue resected over lower pole. Medial and lateral flaps developed.Additional superior pole and lateral chest wall tissue excised. Breast tailor tacked closed, and patient brought to upright sitting position and assessed for symmetry. Patent returned to supine position and breast cavities irrigated and hemostasis obtained. Exparel infiltrated throughout each breast. 15 Fr JP placed in each breast and secured with 2-0 nylon. Closure completed bilateralwith 3-0 vicryl to approximate dermis along inframammary fold and vertical limb. NAC inset with 4-0 vicryl in dermis. Skin closure completed with 4-0 monocryl subcuticular throughout.Tissue adhesive applied. Dry dressing and breast binder applied.  The patient was allowed to wake from anesthesia, extubated and taken to the recovery room in satisfactory condition.   SPECIMENS: right and left breast reduction  DRAINS: 15 Fr JP in right and left breast  Irene Limbo, MD [facility] [phone], pin [phone]

## 2017-12-14 NOTE — Discharge Instructions (Signed)
°Post Anesthesia Home Care Instructions ° °Activity: °Get plenty of rest for the remainder of the day. A responsible individual must stay with you for 24 hours following the procedure.  °For the next 24 hours, DO NOT: °-Drive a car °-Operate machinery °-Drink alcoholic beverages °-Take any medication unless instructed by your physician °-Make any legal decisions or sign important papers. ° °Meals: °Start with liquid foods such as gelatin or soup. Progress to regular foods as tolerated. Avoid greasy, spicy, heavy foods. If nausea and/or vomiting occur, drink only clear liquids until the nausea and/or vomiting subsides. Call your physician if vomiting continues. ° °Special Instructions/Symptoms: °Your throat may feel dry or sore from the anesthesia or the breathing tube placed in your throat during surgery. If this causes discomfort, gargle with warm salt water. The discomfort should disappear within 24 hours. ° °If you had a scopolamine patch placed behind your ear for the management of post- operative nausea and/or vomiting: ° °1. The medication in the patch is effective for 72 hours, after which it should be removed.  Wrap patch in a tissue and discard in the trash. Wash hands thoroughly with soap and water. °2. You may remove the patch earlier than 72 hours if you experience unpleasant side effects which may include dry mouth, dizziness or visual disturbances. °3. Avoid touching the patch. Wash your hands with soap and water after contact with the patch. °   °Information for Discharge Teaching: °EXPAREL (bupivacaine liposome injectable suspension)  ° °Your surgeon or anesthesiologist gave you EXPAREL(bupivacaine) to help control your pain after surgery.  °· EXPAREL is a local anesthetic that provides pain relief by numbing the tissue around the surgical site. °· EXPAREL is designed to release pain medication over time and can control pain for up to 72 hours. °· Depending on how you respond to EXPAREL, you may  require less pain medication during your recovery. ° °Possible side effects: °· Temporary loss of sensation or ability to move in the area where bupivacaine was injected. °· Nausea, vomiting, constipation °· Rarely, numbness and tingling in your mouth or lips, lightheadedness, or anxiety may occur. °· Call your doctor right away if you think you may be experiencing any of these sensations, or if you have other questions regarding possible side effects. ° °Follow all other discharge instructions given to you by your surgeon or nurse. Eat a healthy diet and drink plenty of water or other fluids. ° °If you return to the hospital for any reason within 96 hours following the administration of EXPAREL, it is important for health care providers to know that you have received this anesthetic. A teal colored band has been placed on your arm with the date, time and amount of EXPAREL you have received in order to alert and inform your health care providers. Please leave this armband in place for the full 96 hours following administration, and then you may remove the band. ° °About my Jackson-Pratt Bulb Drain ° °What is a Jackson-Pratt bulb? °A Jackson-Pratt is a soft, round device used to collect drainage. It is connected to a long, thin drainage catheter, which is held in place by one or two small stiches near your surgical incision site. When the bulb is squeezed, it forms a vacuum, forcing the drainage to empty into the bulb. ° °Emptying the Jackson-Pratt bulb- °To empty the bulb: °1. Release the plug on the top of the bulb. °2. Pour the bulb's contents into a measuring container which your nurse will provide. °  3. Record the time emptied and amount of drainage. Empty the drain(s) as often as your     doctor or nurse recommends. ° °Date                  Time                    Amount (Drain 1)                 Amount (Drain  2) ° °_____________________________________________________________________ ° °_____________________________________________________________________ ° °_____________________________________________________________________ ° °_____________________________________________________________________ ° °_____________________________________________________________________ ° °_____________________________________________________________________ ° °_____________________________________________________________________ ° °_____________________________________________________________________ ° °Squeezing the Jackson-Pratt Bulb- °To squeeze the bulb: °1. Make sure the plug at the top of the bulb is open. °2. Squeeze the bulb tightly in your fist. You will hear air squeezing from the bulb. °3. Replace the plug while the bulb is squeezed. °4. Use a safety pin to attach the bulb to your clothing. This will keep the catheter from     pulling at the bulb insertion site. ° °When to call your doctor- °Call your doctor if: °· Drain site becomes red, swollen or hot. °· You have a fever greater than 101 degrees F. °· There is oozing at the drain site. °· Drain falls out (apply a guaze bandage over the drain hole and secure it with tape). °· Drainage increases daily not related to activity patterns. (You will usually have more drainage when you are active than when you are resting.) °· Drainage has a bad odor. ° ° °

## 2017-12-14 NOTE — Anesthesia Preprocedure Evaluation (Addendum)
Anesthesia Evaluation  Patient identified by MRN, date of birth, ID band Patient awake    Reviewed: Allergy & Precautions, NPO status , Patient's Chart, lab work & pertinent test results, reviewed documented beta blocker date and time   History of Anesthesia Complications Negative for: history of anesthetic complications  Airway Mallampati: II  TM Distance: >3 FB Neck ROM: Full    Dental  (+) Missing, Dental Advisory Given   Pulmonary asthma ,    breath sounds clear to auscultation       Cardiovascular hypertension, Pt. on medications and Pt. on home beta blockers (-) CAD  Rhythm:Regular Rate:Normal  2/19 Normal stress nuclear study with apical thinning but no ischemia; EF 69 with normal wall motion.  2/19 ECHO: EF 60-65%, mild MR   Neuro/Psych Anxiety Depression negative neurological ROS     GI/Hepatic Neg liver ROS, GERD  Medicated and Controlled,  Endo/Other  diabetes (glu 131), Oral Hypoglycemic AgentsHypothyroidism obesity  Renal/GU negative Renal ROS     Musculoskeletal   Abdominal (+) + obese,   Peds  Hematology negative hematology ROS (+)   Anesthesia Other Findings   Reproductive/Obstetrics                            Anesthesia Physical Anesthesia Plan  ASA: III  Anesthesia Plan: General   Post-op Pain Management:    Induction: Intravenous  PONV Risk Score and Plan: 4 or greater and Scopolamine patch - Pre-op, Dexamethasone and Ondansetron  Airway Management Planned: Oral ETT  Additional Equipment:   Intra-op Plan:   Post-operative Plan: Extubation in OR  Informed Consent: I have reviewed the patients History and Physical, chart, labs and discussed the procedure including the risks, benefits and alternatives for the proposed anesthesia with the patient or authorized representative who has indicated his/her understanding and acceptance.   Dental advisory given  Plan  Discussed with: CRNA and Surgeon  Anesthesia Plan Comments: (Plan routine monitors, GETA)        Anesthesia Quick Evaluation

## 2017-12-14 NOTE — Anesthesia Procedure Notes (Signed)
Procedure Name: Intubation Date/Time: 12/14/2017 7:39 AM Performed by: Lyndee Leo, CRNA Pre-anesthesia Checklist: Patient identified, Emergency Drugs available, Suction available and Patient being monitored Patient Re-evaluated:Patient Re-evaluated prior to induction Oxygen Delivery Method: Circle system utilized Preoxygenation: Pre-oxygenation with 100% oxygen Induction Type: IV induction Ventilation: Mask ventilation without difficulty Laryngoscope Size: Mac and 3 Grade View: Grade I Tube type: Oral Tube size: 7.0 mm Number of attempts: 1 Airway Equipment and Method: Stylet and Oral airway Placement Confirmation: ETT inserted through vocal cords under direct vision,  positive ETCO2 and breath sounds checked- equal and bilateral Secured at: 22 cm Tube secured with: Tape Dental Injury: Teeth and Oropharynx as per pre-operative assessment

## 2017-12-14 NOTE — Interval H&P Note (Signed)
History and Physical Interval Note:  12/14/2017 6:49 AM  Alexa [name]  has presented today for surgery, with the diagnosis of macromastia chronic neck and back pain intertrigo  The various methods of treatment have been discussed with the patient and family. After consideration of risks, benefits and other options for treatment, the patient has consented to  Procedure(s): BILATERAL MAMMARY REDUCTION  (BREAST) (Bilateral) as a surgical intervention .  The patient's history has been reviewed, patient examined, no change in status, stable for surgery.  I have reviewed the patient's chart and labs.  Questions were answered to the patient's satisfaction.     Arnoldo Hooker Graiden Henes

## 2017-12-15 ENCOUNTER — Encounter (HOSPITAL_BASED_OUTPATIENT_CLINIC_OR_DEPARTMENT_OTHER): Payer: Self-pay | Admitting: Plastic Surgery

## 2017-12-15 DIAGNOSIS — M542 Cervicalgia: Secondary | ICD-10-CM | POA: Diagnosis not present

## 2017-12-15 DIAGNOSIS — F329 Major depressive disorder, single episode, unspecified: Secondary | ICD-10-CM | POA: Diagnosis not present

## 2017-12-15 DIAGNOSIS — N62 Hypertrophy of breast: Secondary | ICD-10-CM | POA: Diagnosis not present

## 2017-12-15 DIAGNOSIS — G8929 Other chronic pain: Secondary | ICD-10-CM | POA: Diagnosis not present

## 2017-12-15 DIAGNOSIS — M549 Dorsalgia, unspecified: Secondary | ICD-10-CM | POA: Diagnosis not present

## 2017-12-15 DIAGNOSIS — Z9884 Bariatric surgery status: Secondary | ICD-10-CM | POA: Diagnosis not present

## 2017-12-15 DIAGNOSIS — E119 Type 2 diabetes mellitus without complications: Secondary | ICD-10-CM | POA: Diagnosis not present

## 2017-12-15 DIAGNOSIS — L304 Erythema intertrigo: Secondary | ICD-10-CM | POA: Diagnosis not present

## 2017-12-15 DIAGNOSIS — F419 Anxiety disorder, unspecified: Secondary | ICD-10-CM | POA: Diagnosis not present

## 2017-12-15 LAB — GLUCOSE, CAPILLARY: GLUCOSE-CAPILLARY: 117 mg/dL — AB (ref 70–99)

## 2017-12-15 NOTE — Discharge Summary (Signed)
Physician Discharge Summary  Patient ID: Alexa [name] MRN: [id number] DOB/AGE: Apr 13, 1960 57 y.o.  Admit date: 12/14/2017 Discharge date: 12/15/2017  Admission Diagnoses: Macromastia, chronic neck and back pain  Discharge Diagnoses:  same  Discharged Condition: stable  Hospital Course: Post operatively patient without nausea tolerating diet ambulatory without assist. Pain controlled. Instructed on drain care.  Treatments: surgery: 11.19.19 bilateral breast reduction  Discharge Exam: Blood pressure 109/74, pulse 78, temperature 97.8 F (36.6 C), resp. rate 18, height 5\' 3"  (1.6 m), weight 81 kg, last menstrual period 05/11/2010, SpO2 98 %.   Disposition: Discharge disposition: 01-Home or Self Care       Discharge Instructions    Call MD for:  redness, tenderness, or signs of infection (pain, swelling, bleeding, redness, odor or green/yellow discharge around incision site)   Complete by:  As directed    Call MD for:  temperature >100.5   Complete by:  As directed    Discharge instructions   Complete by:  As directed    Ok to remove dressings and shower am 11.21.19. Soap and water ok, pat incisions dry. No creams or ointments over incisions. Do not let drains dangle in shower, attach to lanyard or similar.Strip and record drains twice daily and bring log to clinic visit.  Breast binder or soft compression bra all other times.  Ok to raise arms above shoulders for bathing and dressing.  No house yard work or exercise until cleared by MD.   Patient received all Rx preop   Discharge patient   Complete by:  As directed    Discharge disposition:  01-Home or Self Care   Discharge patient date:  12/15/2017   Driving Restrictions   Complete by:  As directed    No driving if taking narcotics   Lifting restrictions   Complete by:  As directed    No lifting > 5 lbs for 2 weeks   Resume previous diet   Complete by:  As directed      Allergies as of 12/15/2017    Reactions   Theophyllines Palpitations      Medication List    TAKE these medications   albuterol 108 (90 Base) MCG/ACT inhaler Commonly known as:  PROVENTIL HFA;VENTOLIN HFA Inhale 2 puffs into the lungs every 6 (six) hours as needed for wheezing or shortness of breath.   albuterol (2.5 MG/3ML) 0.083% nebulizer solution Commonly known as:  PROVENTIL Take 3 mLs (2.5 mg total) by nebulization every 6 (six) hours as needed for wheezing or shortness of breath.   CALCIUM-VITAMIN D PO Take 800 mg by mouth 2 (two) times daily with a meal.   conjugated estrogens vaginal cream Commonly known as:  PREMARIN Place 1 Applicatorful vaginally at bedtime. Use for 21 days then off for 7 days   PREMARIN vaginal cream Generic drug:  conjugated estrogens PLACE 1 APPLICATORFUL VAGINALLY DAILY.   COREG 3.125 MG tablet Generic drug:  carvedilol Take 1 tablet (3.125 mg total) by mouth 2 (two) times daily with a meal.   cyclobenzaprine 10 MG tablet Commonly known as:  FLEXERIL Take 1 tablet (10 mg total) by mouth 3 (three) times daily as needed for muscle spasms.   escitalopram 20 MG tablet Commonly known as:  LEXAPRO TAKE 1 TABLET (20 MG TOTAL) BY MOUTH DAILY.   ibuprofen 200 MG tablet Commonly known as:  ADVIL,MOTRIN Take 600 mg by mouth daily as needed for headache or moderate pain.   levothyroxine 25 MCG tablet Commonly known as:  SYNTHROID, LEVOTHROID TAKE 1 TABLET BY MOUTH ONCE DAILY   metFORMIN 1000 MG tablet Commonly known as:  GLUCOPHAGE TAKE 1 TABLET BY MOUTH 2 TIMES DAILY WITH A MEAL.   multivitamin tablet Take 1 tablet by mouth daily. Reported on 02/26/2015   Naltrexone-buPROPion HCl ER 8-90 MG Tb12 Take 2 tablets by mouth 2 (two) times daily.   ondansetron 4 MG tablet Commonly known as:  ZOFRAN Take 1 tablet (4 mg total) by mouth every 8 (eight) hours as needed for nausea or vomiting.   pantoprazole 40 MG tablet Commonly known as:  PROTONIX TAKE 1 TABLET (40 MG  TOTAL) BY MOUTH DAILY.   SLOW FE PO Take 1 tablet by mouth daily.   telmisartan 40 MG tablet Commonly known as:  MICARDIS Take 0.5 tablets (20 mg total) by mouth 2 (two) times daily.   terbinafine 250 MG tablet Commonly known as:  LAMISIL Take 1 tablet (250 mg total) by mouth daily.   traMADol 50 MG tablet Commonly known as:  ULTRAM Take 1 tablet (50 mg total) by mouth daily as needed for moderate pain or severe pain.   VICTOZA 18 MG/3ML Sopn Generic drug:  liraglutide INJECT 1.8 MG DAILY AS DIRECTED   Vitamin D (Ergocalciferol) 1.25 MG (50000 UT) Caps capsule Commonly known as:  DRISDOL Take 50,000 Units by mouth every Sunday.      Follow-up Information    Irene Limbo, MD In 1 week.   Specialty:  Plastic Surgery Why:  as scheduled Contact information: [address] [place] [address] [phone]           Signed: Irene Limbo 12/15/2017, 7:36 AM

## 2017-12-22 ENCOUNTER — Ambulatory Visit (INDEPENDENT_AMBULATORY_CARE_PROVIDER_SITE_OTHER): Payer: 59 | Admitting: Family Medicine

## 2017-12-22 ENCOUNTER — Encounter (INDEPENDENT_AMBULATORY_CARE_PROVIDER_SITE_OTHER): Payer: Self-pay | Admitting: Family Medicine

## 2017-12-22 ENCOUNTER — Telehealth: Payer: Self-pay | Admitting: Internal Medicine

## 2017-12-22 VITALS — BP 121/84 | HR 95 | Temp 98.2°F | Ht 63.0 in | Wt 169.0 lb

## 2017-12-22 DIAGNOSIS — E663 Overweight: Secondary | ICD-10-CM

## 2017-12-22 DIAGNOSIS — E559 Vitamin D deficiency, unspecified: Secondary | ICD-10-CM | POA: Diagnosis not present

## 2017-12-22 DIAGNOSIS — E11319 Type 2 diabetes mellitus with unspecified diabetic retinopathy without macular edema: Secondary | ICD-10-CM | POA: Diagnosis not present

## 2017-12-22 DIAGNOSIS — Z9189 Other specified personal risk factors, not elsewhere classified: Secondary | ICD-10-CM | POA: Diagnosis not present

## 2017-12-22 MED ORDER — BLOOD GLUCOSE MONITOR KIT: PACK | 0 refills | Status: DC

## 2017-12-22 NOTE — Telephone Encounter (Signed)
Spoke with patients husband, Gene on 11/19 @ 1025.  Patient was in surgery at that time, so I left message with husband regarding patients question about the [facility] referrals.  Does she need referrals to Specialists?  Yes.  She will need a referral to any specialist except her GYN or Mental Health.  And, she would need the ok from her PCP and then contact [facility] for a referral # and provide that to the specialists.   Patient stated she was going to see the following and Dr. Renold Genta is fine with it:  Dr.Michael Tobe Sos - Endocrinology [place] - Cardiology [place] Doctor Dr.Matthew Jacqualyn Posey - Foot Doctor Dr.Cathy Shofetty - Therapist ? - [place] [facility] Dr. Leafy Ro - Weight Dr. Jory Ee - ?

## 2017-12-29 ENCOUNTER — Telehealth: Payer: Self-pay

## 2017-12-29 ENCOUNTER — Encounter (INDEPENDENT_AMBULATORY_CARE_PROVIDER_SITE_OTHER): Payer: Self-pay | Admitting: Family Medicine

## 2017-12-29 NOTE — Progress Notes (Signed)
Office: [phone]  /  Fax: [phone]   HPI:   Chief Complaint: OBESITY Alexa [name] is here to discuss her progress with her obesity treatment plan. She was advised to follow the portion control better and make smarter food choices plan and she is getting back on the Category 2 plan and is following her eating plan approximately 66% of the time. She states she is exercising 0 minutes 0 times per week. Alexa [name] had breast reduction 12/14/17 and she is doing well. She is getting back on the meal plan. Alexa [name] is not eating enough protein. She has been off Contrave since ten days before her surgery. Her weight is 169 lb (76.7 kg) today and has had a weight loss of 4 pounds over a period of 3 weeks since her last visit. She has lost 0 lbs since starting treatment with [name].  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 She is on metformin and victoza. Alexa [name] states fasting BGs range between 90 and 140's and 2 hour post prandial BGs range in the 180's and she denies any hypoglycemic episodes. She has been working on intensive lifestyle modifications including diet, exercise, and weight loss to help control her blood glucose levels.  Vitamin D deficiency Alexa [name] has a diagnosis of vitamin D deficiency. Alexa [name] is currently taking prescription vit D and she is not at goal. She denies nausea, vomiting or muscle weakness.  At risk for osteopenia and osteoporosis Alexa [name] is at higher risk of osteopenia and osteoporosis due to vitamin D deficiency.   ALLERGIES: Allergies  Allergen Reactions  . Theophyllines Palpitations    MEDICATIONS: Current Outpatient Medications on File Prior to Visit  Medication Sig Dispense Refill  . albuterol (PROVENTIL HFA;VENTOLIN HFA) 108 (90 Base) MCG/ACT inhaler Inhale 2 puffs into the lungs every 6 (six) hours as needed for wheezing or shortness of breath. 1 Inhaler 11  . albuterol (PROVENTIL) (2.5 MG/3ML) 0.083% nebulizer  solution Take 3 mLs (2.5 mg total) by nebulization every 6 (six) hours as needed for wheezing or shortness of breath. 150 mL 1  . CALCIUM-VITAMIN D PO Take 800 mg by mouth 2 (two) times daily with a meal.     . conjugated estrogens (PREMARIN) vaginal cream Place 1 Applicatorful vaginally at bedtime. Use for 21 days then off for 7 days    . COREG 3.125 MG tablet Take 1 tablet (3.125 mg total) by mouth 2 (two) times daily with a meal. 60 tablet 11  . cyclobenzaprine (FLEXERIL) 10 MG tablet Take 1 tablet (10 mg total) by mouth 3 (three) times daily as needed for muscle spasms. 90 tablet 0  . escitalopram (LEXAPRO) 20 MG tablet TAKE 1 TABLET (20 MG TOTAL) BY MOUTH DAILY. 90 tablet 1  . Ferrous Sulfate (SLOW FE PO) Take 1 tablet by mouth daily.     Marland Kitchen ibuprofen (ADVIL,MOTRIN) 200 MG tablet Take 600 mg by mouth daily as needed for headache or moderate pain.    Marland Kitchen levothyroxine (SYNTHROID, LEVOTHROID) 25 MCG tablet TAKE 1 TABLET BY MOUTH ONCE DAILY 90 tablet 1  . metFORMIN (GLUCOPHAGE) 1000 MG tablet TAKE 1 TABLET BY MOUTH 2 TIMES DAILY WITH A MEAL. 180 tablet 1  . Multiple Vitamin (MULTIVITAMIN) tablet Take 1 tablet by mouth daily. Reported on 02/26/2015 30 tablet 0  . Naltrexone-buPROPion HCl ER (CONTRAVE) 8-90 MG TB12 Take 2 tablets by mouth 2 (two) times daily. 360 tablet 1  . ondansetron (ZOFRAN) 4 MG tablet Take 1 tablet (  4 mg total) by mouth every 8 (eight) hours as needed for nausea or vomiting. 20 tablet 0  . pantoprazole (PROTONIX) 40 MG tablet TAKE 1 TABLET (40 MG TOTAL) BY MOUTH DAILY. 90 tablet 4  . PREMARIN vaginal cream PLACE 1 APPLICATORFUL VAGINALLY DAILY. 30 g 11  . telmisartan (MICARDIS) 40 MG tablet Take 0.5 tablets (20 mg total) by mouth 2 (two) times daily. 90 tablet 3  . terbinafine (LAMISIL) 250 MG tablet Take 1 tablet (250 mg total) by mouth daily. 30 tablet 0  . traMADol (ULTRAM) 50 MG tablet Take 1 tablet (50 mg total) by mouth daily as needed for moderate pain or severe pain. 30  tablet 1  . VICTOZA 18 MG/3ML SOPN INJECT 1.8 MG DAILY AS DIRECTED 27 mL 1  . Vitamin D, Ergocalciferol, (DRISDOL) 50000 units CAPS capsule Take 50,000 Units by mouth every Sunday.     No current facility-administered medications on file prior to visit.     PAST MEDICAL HISTORY: Past Medical History:  Diagnosis Date  . Anemia, iron deficiency   . Anhedonia   . Anxiety   . Asthma   . Asthma, chronic   . Back pain   . CAD (coronary artery disease)   . Chewing difficulty   . Chronic insomnia   . Combined hyperlipidemia   . Constipation   . Depression   . Diabetes mellitus   . Diabetes mellitus type II   . Diabetic autonomic neuropathy ([place])   . Difficulty swallowing pills   . DM type 2 with diabetic peripheral neuropathy ([place])   . Dry mouth   . Dyspepsia   . Dyspnea   . 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 Essential tremor   . Fatigue   . Fatty liver   . GERD (gastroesophageal reflux disease)   . GERD (gastroesophageal reflux disease)   . Glossitis   . Goiter   . Hemorrhoids   . HLD (hyperlipidemia)   . Hyperparathyroidism , secondary, non-renal ([place])   . Hypertension   . Hypocalcemia   . Insomnia   . Leg edema   . Leg pain   . Macular degeneration   . Nonproliferative diabetic retinopathy associated with type 2 diabetes mellitus ([place])    RESOLVED  . Obesity, Class III, BMI 40-49.9 (morbid obesity) ([place])   . Osteopenia   . Osteoporosis   . Pallor   . Status post gastric surgery   . Tachycardia   . Thyroiditis, autoimmune   . Vaginal dryness, menopausal   . Vitamin D deficiency     PAST SURGICAL HISTORY: Past Surgical History:  Procedure Laterality Date  . BREAST REDUCTION SURGERY Bilateral 12/14/2017   Procedure: BILATERAL MAMMARY REDUCTION  (BREAST);  Surgeon: Irene Limbo, MD;  Location: [facility];  Service: Plastics;  Laterality: Bilateral;  . CARDIAC CATHETERIZATION     x 2  . ROUX-EN-Y PROCEDURE    . TONSILLECTOMY      SOCIAL  HISTORY: Social History   Tobacco Use  . Smoking status: Never Smoker  . Smokeless tobacco: Never Used  Substance Use Topics  . Alcohol use: No    Alcohol/week: 0.0 standard drinks  . Drug use: No    FAMILY HISTORY: Family History  Problem Relation Age of Onset  . Thyroid disease Mother        Hypothyroid  . Depression Mother   . Liver disease Mother   . Obesity Mother   . Diabetes Father  T2 DM  . Hypertension Father   . Hyperlipidemia Father   . Heart disease Father   . Obesity Brother        250 pounds at a height of 76 inches.  . Thyroid disease Sister        Hypothyroid  . Diabetes Maternal Grandmother   . Colon cancer Other        great grandfather  . Liver disease Maternal Aunt        NASH  . Esophageal cancer Neg Hx   . Pancreatic cancer Neg Hx   . Kidney disease Neg Hx     ROS: Review of Systems  Constitutional: Positive for weight loss.  Gastrointestinal: Negative for nausea and vomiting.  Musculoskeletal:       Negative for muscle weakness  Endo/Heme/Allergies:       Negative for hypoglycemia    PHYSICAL EXAM: Blood pressure 121/84, pulse 95, temperature 98.2 F (36.8 C), temperature source Oral, height '5\' 3"'$  (1.6 m), weight 169 lb (76.7 kg), last menstrual period 05/11/2010, SpO2 97 %. Body mass index is 29.94 kg/m. Physical Exam  Constitutional: She is oriented to person, place, and time. She appears well-developed and well-nourished.  Cardiovascular: Normal rate.  Pulmonary/Chest: Effort normal.  Musculoskeletal: Normal range of motion.  Neurological: She is oriented to person, place, and time.  Skin: Skin is warm and dry.  Psychiatric: She has a normal mood and affect. Her behavior is normal.  Vitals reviewed.   RECENT LABS AND TESTS: BMET    Component Value Date/Time   NA 134 (L) 12/02/2017 1100   K 4.1 12/02/2017 1100   CL 103 12/02/2017 1100   CO2 20 (L) 12/02/2017 1100   GLUCOSE 322 (H) 12/02/2017 1100   BUN 20  12/02/2017 1100   CREATININE 1.17 (H) 12/02/2017 1100   CREATININE 1.27 (H) 06/08/2017 0000   CALCIUM 8.9 12/02/2017 1100   CALCIUM 9.3 08/14/2011 1217   GFRNONAA 51 (L) 12/02/2017 1100   GFRNONAA 67 11/17/2016 0941   GFRAA 59 (L) 12/02/2017 1100   GFRAA 78 11/17/2016 0941   Lab Results  Component Value Date   HGBA1C 6.9 (A) 11/01/2017   HGBA1C 10.0 04/12/2017   HGBA1C 13.4 (H) 11/17/2016   HGBA1C 8.1 06/04/2016   HGBA1C 13.4 12/31/2015   Lab Results  Component Value Date   INSULIN 8.1 11/16/2017   CBC    Component Value Date/Time   WBC 8.1 12/02/2017 1651   RBC 4.13 12/02/2017 1651   HGB 11.8 12/02/2017 1651   HCT 35.3 12/02/2017 1651   PLT 407 (H) 12/02/2017 1651   MCV 85.5 12/02/2017 1651   MCH 28.6 12/02/2017 1651   MCHC 33.4 12/02/2017 1651   RDW 12.6 12/02/2017 1651   LYMPHSABS 2,673 12/02/2017 1651   MONOABS 0.8 10/19/2015 1327   EOSABS 113 12/02/2017 1651   BASOSABS 57 12/02/2017 1651   Iron/TIBC/Ferritin/ %Sat    Component Value Date/Time   IRON 55 05/08/2013 1622   TIBC 357 04/14/2012 1059   IRONPCTSAT 57 (H) 04/14/2012 1059   Lipid Panel     Component Value Date/Time   CHOL 188 06/08/2017 0000   TRIG 110 06/08/2017 0000   HDL 67 06/08/2017 0000   CHOLHDL 2.8 06/08/2017 0000   VLDL 10 03/05/2017 1903   LDLCALC 100 (H) 06/08/2017 0000   Hepatic Function Panel     Component Value Date/Time   PROT 6.3 12/02/2017 1651   ALBUMIN 4.1 05/27/2016 0828   AST 13 12/02/2017  1651   ALT 13 12/02/2017 1651   ALKPHOS 85 05/27/2016 0828   BILITOT 0.3 12/02/2017 1651   BILIDIR 0.1 12/02/2017 1651   IBILI 0.2 12/02/2017 1651      Component Value Date/Time   TSH 1.80 06/08/2017 0000   TSH 0.938 03/05/2017 1903   TSH 1.54 11/17/2016 0941   TSH 2.62 05/27/2016 0828    Ref. 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 We discussed the importance of good blood sugar control to decrease the likelihood of diabetic complications such as nephropathy, neuropathy, limb loss, blindness, coronary artery disease, and death. We discussed the importance of intensive lifestyle modification including diet, exercise and weight loss as the first line treatment for diabetes. Alexa [name] agrees to continue her diabetes medications and a prescription was written today for meter and strips freestyle lite) TID testing #180 strips (3 month supply). Alexa [name] agrees to follow up at the agreed upon time.  Vitamin D Deficiency Alexa [name] was informed that low vitamin D levels contributes to fatigue and are associated with obesity, breast, and colon cancer. She agrees to continue to take prescription Vit D _0 ,000 IU every week and will follow up for routine testing of vitamin D, at least 2-3 times per year. She was informed of the risk of over-replacement of vitamin D and agrees to not increase her dose unless she discusses this with [name] first.  At risk for osteopenia and osteoporosis Alexa [name] was given extended  (15 minutes) osteoporosis prevention counseling today. Alexa [name] is at risk for osteopenia and osteoporsis due to her vitamin D deficiency. She was encouraged to take her vitamin D and follow her higher calcium diet and increase strengthening exercise to help strengthen her bones and decrease her risk of osteopenia and  osteoporosis.  Obesity Alexa [name] is currently in the action stage of change. As such, her goal is to get back to weightloss efforts  She has agreed to follow the Category 2 plan Alexa [name] has been instructed to work up to a goal of 150 minutes of combined cardio and strengthening exercise per week for weight loss and overall health benefits. We discussed the following Behavioral Modification Strategies today: increasing lean protein intake and holiday eating strategies   We discussed various medication options to help Alexa [name] with her weight loss efforts and we both agreed to continue Contrave 2 pills BID (no refill needed). She may restart after opioid pain medication is discontinued.  Alexa [name] has agreed to follow up with our clinic in 2 weeks. She was informed of the importance of frequent follow up visits to maximize her success with intensive lifestyle modifications for her multiple health conditions.   OBESITY BEHAVIORAL INTERVENTION VISIT  Today's visit was # 3  Starting weight: 169 lbs Starting date: 11/16/2017 Today's weight : 169 lbs Today's date: 12/22/2017 Total lbs lost to date: 0   ASK: We discussed the diagnosis of obesity with Alexa [name]  today and Myka agreed to give [name] permission to discuss obesity behavioral modification therapy today.  ASSESS: Daisi has the diagnosis of obesity and her BMI today is 29.94 Thandiwe is in the action stage of change   ADVISE: Cleora was educated on the multiple health risks of obesity as well as the benefit of weight loss to improve her health. She was advised of the need for long term treatment and the importance of lifestyle modifications to improve her current health and to decrease her risk of future health problems.  AGREE: Multiple dietary modification options and treatment options were discussed and  Peja agreed to follow the recommendations documented in the above note.  ARRANGE: Elane was educated on the importance of  frequent visits to treat obesity as outlined per CMS and USPSTF guidelines and agreed to schedule her next follow up appointment today.  Corey Skains, am acting as Location manager for [employer], FNP-C.  I have reviewed the above documentation for accuracy and completeness, and I agree with the above.  - Greycen Felter, FNP-C.

## 2017-12-29 NOTE — Telephone Encounter (Signed)
Booked tomorrow at 12:45

## 2017-12-29 NOTE — Telephone Encounter (Addendum)
Patient called has extreme back lower left side pain, left hip and leg pain. She said she fell about a week ago, she is walking she doesn't think she broke her leg, but she would like to come see you. She said she is taking ultram and it isn't helping with the pain.

## 2017-12-30 ENCOUNTER — Encounter: Payer: Self-pay | Admitting: Internal Medicine

## 2017-12-30 ENCOUNTER — Ambulatory Visit (INDEPENDENT_AMBULATORY_CARE_PROVIDER_SITE_OTHER): Payer: 59 | Admitting: Internal Medicine

## 2017-12-30 VITALS — BP 130/90 | HR 74 | Temp 98.4°F | Ht 63.0 in | Wt 174.0 lb

## 2017-12-30 DIAGNOSIS — S8012XA Contusion of left lower leg, initial encounter: Secondary | ICD-10-CM | POA: Diagnosis not present

## 2017-12-30 DIAGNOSIS — W19XXXA Unspecified fall, initial encounter: Secondary | ICD-10-CM

## 2017-12-30 DIAGNOSIS — M5416 Radiculopathy, lumbar region: Secondary | ICD-10-CM | POA: Diagnosis not present

## 2017-12-30 DIAGNOSIS — Y92009 Unspecified place in unspecified non-institutional (private) residence as the place of occurrence of the external cause: Secondary | ICD-10-CM

## 2017-12-30 MED ORDER — HYDROCODONE-ACETAMINOPHEN 10-325 MG PO TABS: 1.0000 | ORAL_TABLET | Freq: Three times a day (TID) | ORAL | 0 refills | Status: DC | PRN

## 2017-12-30 MED FILL — HYDROCODON-APAP 10-325: 10-325 | 10 days supply | Qty: 30 | Fill #0

## 2017-12-30 NOTE — Patient Instructions (Signed)
Take Norco sparing for pain. Apply ice or hear to lower leg 20 minutes 2 or 3 times a day.  Take Flexeril for back spasm.Call if not better next week.

## 2018-01-03 ENCOUNTER — Other Ambulatory Visit (INDEPENDENT_AMBULATORY_CARE_PROVIDER_SITE_OTHER): Payer: Self-pay | Admitting: *Deleted

## 2018-01-03 ENCOUNTER — Ambulatory Visit: Payer: Self-pay

## 2018-01-03 ENCOUNTER — Encounter (INDEPENDENT_AMBULATORY_CARE_PROVIDER_SITE_OTHER): Payer: Self-pay | Admitting: "Endocrinology

## 2018-01-03 ENCOUNTER — Ambulatory Visit (INDEPENDENT_AMBULATORY_CARE_PROVIDER_SITE_OTHER): Payer: 59 | Admitting: "Endocrinology

## 2018-01-03 VITALS — BP 118/74 | HR 90 | Ht 63.62 in | Wt 174.5 lb

## 2018-01-03 DIAGNOSIS — K219 Gastro-esophageal reflux disease without esophagitis: Secondary | ICD-10-CM

## 2018-01-03 DIAGNOSIS — E211 Secondary hyperparathyroidism, not elsewhere classified: Secondary | ICD-10-CM

## 2018-01-03 DIAGNOSIS — I10 Essential (primary) hypertension: Secondary | ICD-10-CM

## 2018-01-03 DIAGNOSIS — E063 Autoimmune thyroiditis: Secondary | ICD-10-CM | POA: Diagnosis not present

## 2018-01-03 DIAGNOSIS — E049 Nontoxic goiter, unspecified: Secondary | ICD-10-CM

## 2018-01-03 DIAGNOSIS — E11649 Type 2 diabetes mellitus with hypoglycemia without coma: Secondary | ICD-10-CM

## 2018-01-03 DIAGNOSIS — E1165 Type 2 diabetes mellitus with hyperglycemia: Secondary | ICD-10-CM | POA: Diagnosis not present

## 2018-01-03 DIAGNOSIS — R4584 Anhedonia: Secondary | ICD-10-CM

## 2018-01-03 DIAGNOSIS — E1143 Type 2 diabetes mellitus with diabetic autonomic (poly)neuropathy: Secondary | ICD-10-CM | POA: Diagnosis not present

## 2018-01-03 DIAGNOSIS — R000 Tachycardia, unspecified: Secondary | ICD-10-CM

## 2018-01-03 DIAGNOSIS — E559 Vitamin D deficiency, unspecified: Secondary | ICD-10-CM

## 2018-01-03 DIAGNOSIS — B351 Tinea unguium: Secondary | ICD-10-CM

## 2018-01-03 LAB — POCT GLUCOSE (DEVICE FOR HOME USE): Glucose Fasting, POC: 112 mg/dL — AB (ref 70–99)

## 2018-01-03 MED ORDER — GLUCOSE BLOOD VI STRP: ORAL_STRIP | 1 refills | Status: DC

## 2018-01-03 MED ORDER — FREESTYLE LITE DEVI: 5 refills | Status: AC

## 2018-01-03 MED FILL — FREESTYLE LANCETS: 90 days supply | Qty: 300 | Fill #0

## 2018-01-03 MED FILL — FREESTYLE LITE TEST STRIP: 90 days supply | Qty: 300 | Fill #0

## 2018-01-03 MED FILL — FREESTYLE LITE METER: 30 days supply | Qty: 1 | Fill #0

## 2018-01-03 NOTE — Patient Instructions (Addendum)
Follow up visit in two months. Please repeat lab tests 1-2 weeks prior to next visit.

## 2018-01-03 NOTE — Progress Notes (Signed)
Subjective:  Patient Name: Alexa [name] Date of Birth: 10-01-1960  MRN: [id number]  Alexa [name]  presents to the office today for follow-up of her type 2 diabetes mellitus, obesity, combined hyperlipidemia, GERD, hypertension, ASHD, dyspepsia, pedal edema, non-proliferative diabetic retinopathy, goiter, depression, autonomic neuropathy, tachycardia, peripheral neuropathy, acquired hypothyroidism, vitamin D deficiency, hypocalcemia, secondary hyperparathyroidism, glossitis, pallor, fatigue, iron deficiency anemia, anhedonia, disinclination to take medicines, and status post Roux-en-Y gastric bypass.   HISTORY OF PRESENT ILLNESS:   Alexa [name] is a 57 y.o. Caucasian woman. Alexa [name] was unaccompanied.  1. The patient first presented to me on 08/20/04 in referral from her primary care internist, Dr. Emeline General, for evaluation and management of type 2 diabetes, obesity, and multiple medical issues. She was 57 years old.   [name] had a long history of obesity. She was heavy as a child. She underwent menarche at age 26. At age 57 she was 180 pounds. In 1996 she weighed 218 pounds. In 1999 she was diagnosed with type 2 diabetes mellitus. Her weight at that point was 250 pounds. She was treated with Glucophage, and Actos. Actos made her gain even more weight. She had also been treated with glipizide in the past. More recently she had been treated with Lantus and Glucophage plus regular insulin as needed, especially for when she took steroids for asthma attacks. Maximum weight had been 296 pounds one month prior to that first visit with me. Her tendency to gain weight and her difficulty in losing weight were aggravated by long-standing, intermittent depression and by severe, recurrent asthma requiring the use of steroid medications.  B. Past medical history was also positive for a 60% blockage in one of her coronary arteries. She had significant issues with GERD and dyspepsia. She also had combined  hyperlipidemia. She had a previous cardiac catheterization and previous tonsillectomy. She was allergic to theophylline. Her pertinent review of systems was positive for some numbness and tingling in her feet. Family history was positive for type 2 diabetes in her father and her maternal grandmother. Her brother weighed 250 pounds at a height of 76 inches. Both her mother and her sister were hypothyroid.  C. On physical examination, her weight was 279.9 pounds. Her BMI was 49.6. Her blood pressure was 142/86. Her heart rate was 96. Her hemoglobin A1c was 9.8%. She was alert and oriented x3. Her affect was normal. Her insight was fair. She was obviously quite obese. She had a 25 gm thyroid gland. She had 1+ tremor of her hands. She had 1+ DP pulses and 2+ tinea pedis. Sensation to touch was intact in her feet. Laboratory data included a normal CMP. Her cholesterol was 175, triglycerides 76, HDL 53, and LDL 107. Her TSH was 2.98. Since she obviously did have type 2 diabetes mellitus associated with morbid obesity, and since weight loss was a major factor for her, I asked her to resume her metformin twice daily. I also started her on Byetta, initially 5 mcg twice daily and later 10 mcg twice daily. I discontinued her Lantus insulin.  2. During the last 13 years,we have had some successes, some failures, and some new problem areas.  A. T2DM: In October 2007 the combination of Byetta and metformin was causing more gastrointestinal problems. She opted to stop the metformin and continue the Byetta because it was helping her with weight control and blood sugar control. By 08/11/06 her weight had decreased to 267.6 pounds. Hemoglobin A1c was 8.6%. At that point she  decided to have bariatric surgery. She had a Roux-en-Y gastric bypass on 12/20/2006. She subsequently lost weight down to 173.4 pounds on 05/23/08, but then subsequently regained weight to the 190s. Her  hemoglobin A1c values varied in parallel with her weights.  On 05/23/08, at the point of her lowest weight, her hemoglobin A1c dropped to a nadir of 6.3%.  Since then her hemoglobin A1c values have varied between 6.6 and 13.4%. Although we were initially able to stop all of her diabetes medicines after bariatric surgery, when she began to regain weight we restarted metformin, 500 mg twice daily. Although she took her metformin twice daily for a long time, she had discontinued it many years ago. She hated to take medicines and could not make herself be compliant in taking medications or exercising. When her HbA1c increased to 9.4% in September 2015, I stopped her Byetta injections and started her on liraglutide (Victoza) daily injections on 11/16/13. After several more years of noncompliance, she has been doing much better in the past year.   B. Vitamin D deficiency, hypocalcemia, and secondary hyperparathyroidism: Just before her gastric bypass, I obtained baseline bone mineral metabolism studies. Her 25-hydroxy vitamin D was very low at 7 (normal greater than or equal to 30). Her calcium was 9.5 (normal 8.6-10.6). Her parathyroid hormone was 38.4 (normal 14-72). Subsequent to her surgery, the patient was supposed to be taking multivitamins with calcium and vitamin D, but did not always do so. On 03/29/2007 her 25-hydroxy vitamin D was 29, but her calcium decreased to 8.7. Her PTH was slightly elevated at 73.5. Her 1,25-hydroxy vitamin D was 46. Her iron was 56. I asked her to make sure she took her multivitamins and calcium daily. Unfortunately, she has often been non-compliant with her multivitamins and calcium. Her 25-hydroxy vitamin D values have varied between 15-35. Her calcium values have varied between 8.1-9.4. Her PTH values have remained elevated between 79-167. Ki's iron levels have also been low, between 32-34. She was supposed to be taking iron every day, but frequently did not do so. In the past year, however, she has been doing better at taking her vitamin  D, calcium, and iron. In May 2019 her vitamin D was at the lower end of the reference range, her calcium was about the 40% of the reference range, but her PTH was still significantly elevated.    Joylene Igo has had psychological issues for many years that have adversely affected her eating, her unwillingness to exercise, her noncompliance with taking medications, her weight, and her T2DM care. Fortunately, her recent outpatient psychological therapy with Ms. Rea College, RN, MS and her taking Lexapro regularly have resulted in [place] having a much more positive attitude about life in general and a generally more positive attitude about taking her medications. Fortunately, Skyla's has been dealing successfully with her issues and her compliance has markedly improved.   D. Obesity: Although Nitara regained a great deal of weight in the years following her bariatric surgery, in the past year she has been taking Contrave and has been able to lose almost all of the weight that she had gained after her bariatric surgery.   3. Aseret's last [facility] visit was on 11/01/17. At that visit I asked her to continue her morning dose of metformin of 500 mg and continue the evening dose of 1000 mg. I continued her Synthroid dose at bedtime. I asked her to take one [employer], 50,000 IU capsule of vitamin D weekly and calcium at each  meal.   A. She has been healthy. She has breast reduction surgery on 12/13/17. Since then she has felt great. The reduction of pressure in her neck and back has been "amazing". She was off al supplements and vitamins for 5 in the peri-operative period, but is taking all of her medications again.    B. She also stopped Contrave for surgery She does not think that she is eating more. She will resume taking Contrave, 2 pills, twice daily very soon.   Joylene Igo remains in counseling with Ms. Showfety every month.    D. She has not had much of a problem with asthma this year.    E. She has been sleeping  "pretty good". She still has some hot flashes, but "not as bad as it was".  F. She has not had many leg cramps during the night as long as she drinks enough liquids.  She has been having more pains in the left shin, sometimes for hours at a time. In retrospect, she fell shortly before her surgery and bruised that area.  G. She has not been walking much. She rarely drinks regular Coke anymore.  H. She is taking Lexapro, 20 mg/day; Synthroid, 25 mcg/day; Micardis, 20 mg, twice daily; Victoza,1.8 mg/day; Protonix, 40 mg/day; metformin 500 mg on the morning and 1000 mg in the evening twice daily. She takes one 800 mg Citracal tablet twice daily. She is also taking Coreg, 3.125 mg, twice daily. Dr. Renold Genta wants her to start taking B12.   I. She is doing very well at not allowing herself to become over-committed. She is trying to achieve more balance.   J. She has not had any low BG symptom recently  K. Her podiatrist has started her on Terbinafine (Lamisil) tablets, one daily, for her toenail fungus.   4. Review of Systems: There are no other significant issues  Constitutional: Thre[place] feels "good".   Eyes: Vision has been okay. She had an eye exam during the Summer. No diabetic eye disease was noted. Neck: She occasionally has trouble swallowing, but less often. The patient has no complaints of anterior neck swelling, soreness, tenderness, pressure, or discomfort.  Heart: She saw her cardiologist in August and will have a follow up visit in early 2020. She no longer has  faster heart rates when she is anxious. She has not had any further chest pain or pressure. Heart rate increases with exercise or other physical activity. She has no complaints of palpitations or irregular heat beats Gastrointestinal: She no longer wakes up nauseous. She has had some constipation recently. She rarely  has the sensation of discomfort in her LUQ. She has not had any post-prandial bloating recently. She does not have any  excessive head hunger or belly hunger, acid reflux, upset stomach, stomach aches or pains, swallowing difficulties, or diarrhea. Legs: As above. She no longer has occasional nocturnal calf cramps. Muscle mass and strength seem normal. There are no new complaints of numbness, tingling, or burning. No edema is noted. Feet: There are no complaints of numbness, tingling, burning, or pain. No edema is noted. Tinea improved with her new anti-fungal medication.  Neuro: No new sensory or muscular problems Psych: She is doing well.    GU: She has not been urinating a lot. She rarely has nocturia.  Hypoglycemia: None recently.    5. BG printout: Her BG meter is broken.   PAST MEDICAL, FAMILY, AND SOCIAL HISTORY:  Past Medical History:  Diagnosis Date  . Anemia,  iron deficiency   . Anhedonia   . Anxiety   . Asthma   . Asthma, chronic   . Back pain   . CAD (coronary artery disease)   . Chewing difficulty   . Chronic insomnia   . Combined hyperlipidemia   . Constipation   . Depression   . Diabetes mellitus   . Diabetes mellitus type II   . Diabetic autonomic neuropathy ([place])   . Difficulty swallowing pills   . DM type 2 with diabetic peripheral neuropathy ([place])   . Dry mouth   . Dyspepsia   . Dyspnea   . Elevated homocysteine ([place])   . Essential tremor   . Fatigue   . Fatty liver   . GERD (gastroesophageal reflux disease)   . GERD (gastroesophageal reflux disease)   . Glossitis   . Goiter   . Hemorrhoids   . HLD (hyperlipidemia)   . Hyperparathyroidism , secondary, non-renal ([place])   . Hypertension   . Hypocalcemia   . Insomnia   . Leg edema   . Leg pain   . Macular degeneration   . Nonproliferative diabetic retinopathy associated with type 2 diabetes mellitus ([place])    RESOLVED  . Obesity, Class III, BMI 40-49.9 (morbid obesity) ([place])   . Osteopenia   . Osteoporosis   . Pallor   . Status post gastric surgery   . Tachycardia   . Thyroiditis, autoimmune   . Vaginal  dryness, menopausal   . Vitamin D deficiency     Family History  Problem Relation Age of Onset  . Thyroid disease Mother        Hypothyroid  . Depression Mother   . Liver disease Mother   . Obesity Mother   . Diabetes Father        T2 DM  . Hypertension Father   . Hyperlipidemia Father   . Heart disease Father   . Obesity Brother        250 pounds at a height of 76 inches.  . Thyroid disease Sister        Hypothyroid  . Diabetes Maternal Grandmother   . Colon cancer Other        great grandfather  . Liver disease Maternal Aunt        NASH  . Esophageal cancer Neg Hx   . Pancreatic cancer Neg Hx   . Kidney disease Neg Hx      Current Outpatient Medications:  .  Blood Glucose Monitoring Suppl (FREESTYLE LITE) DEVI, Use to check glucose 3x daily, Disp: 2 each, Rfl: 5 .  CALCIUM-VITAMIN D PO, Take 800 mg by mouth 2 (two) times daily with a meal. , Disp: , Rfl:  .  conjugated estrogens (PREMARIN) vaginal cream, Place 1 Applicatorful vaginally at bedtime. Use for 21 days then off for 7 days, Disp: , Rfl:  .  COREG 3.125 MG tablet, Take 1 tablet (3.125 mg total) by mouth 2 (two) times daily with a meal., Disp: 60 tablet, Rfl: 11 .  cyclobenzaprine (FLEXERIL) 10 MG tablet, Take 1 tablet (10 mg total) by mouth 3 (three) times daily as needed for muscle spasms., Disp: 90 tablet, Rfl: 0 .  escitalopram (LEXAPRO) 20 MG tablet, TAKE 1 TABLET (20 MG TOTAL) BY MOUTH DAILY., Disp: 90 tablet, Rfl: 1 .  Ferrous Sulfate (SLOW FE PO), Take 1 tablet by mouth daily. , Disp: , Rfl:  .  glucose blood (FREESTYLE LITE) test strip, Use to check glucose 3x daily, Disp: 300  each, Rfl: 1 .  HYDROcodone-acetaminophen (NORCO) 10-325 MG tablet, Take 1 tablet by mouth every 8 (eight) hours as needed., Disp: 30 tablet, Rfl: 0 .  ibuprofen (ADVIL,MOTRIN) 200 MG tablet, Take 600 mg by mouth daily as needed for headache or moderate pain., Disp: , Rfl:  .  levothyroxine (SYNTHROID, LEVOTHROID) 25 MCG tablet, TAKE  1 TABLET BY MOUTH ONCE DAILY, Disp: 90 tablet, Rfl: 1 .  metFORMIN (GLUCOPHAGE) 1000 MG tablet, TAKE 1 TABLET BY MOUTH 2 TIMES DAILY WITH A MEAL., Disp: 180 tablet, Rfl: 1 .  Multiple Vitamin (MULTIVITAMIN) tablet, Take 1 tablet by mouth daily. Reported on 02/26/2015, Disp: 30 tablet, Rfl: 0 .  pantoprazole (PROTONIX) 40 MG tablet, TAKE 1 TABLET (40 MG TOTAL) BY MOUTH DAILY., Disp: 90 tablet, Rfl: 4 .  PREMARIN vaginal cream, PLACE 1 APPLICATORFUL VAGINALLY DAILY., Disp: 30 g, Rfl: 11 .  telmisartan (MICARDIS) 40 MG tablet, Take 0.5 tablets (20 mg total) by mouth 2 (two) times daily., Disp: 90 tablet, Rfl: 3 .  terbinafine (LAMISIL) 250 MG tablet, Take 1 tablet (250 mg total) by mouth daily., Disp: 30 tablet, Rfl: 0 .  traMADol (ULTRAM) 50 MG tablet, Take 1 tablet (50 mg total) by mouth daily as needed for moderate pain or severe pain., Disp: 30 tablet, Rfl: 1 .  VICTOZA 18 MG/3ML SOPN, INJECT 1.8 MG DAILY AS DIRECTED, Disp: 27 mL, Rfl: 1 .  Vitamin D, Ergocalciferol, (DRISDOL) 50000 units CAPS capsule, Take 50,000 Units by mouth every Sunday., Disp: , Rfl:  .  albuterol (PROVENTIL HFA;VENTOLIN HFA) 108 (90 Base) MCG/ACT inhaler, Inhale 2 puffs into the lungs every 6 (six) hours as needed for wheezing or shortness of breath. (Patient not taking: Reported on 01/03/2018), Disp: 1 Inhaler, Rfl: 11 .  albuterol (PROVENTIL) (2.5 MG/3ML) 0.083% nebulizer solution, Take 3 mLs (2.5 mg total) by nebulization every 6 (six) hours as needed for wheezing or shortness of breath. (Patient not taking: Reported on 01/03/2018), Disp: 150 mL, Rfl: 1 .  ondansetron (ZOFRAN) 4 MG tablet, Take 1 tablet (4 mg total) by mouth every 8 (eight) hours as needed for nausea or vomiting. (Patient not taking: Reported on 01/03/2018), Disp: 20 tablet, Rfl: 0  Allergies as of 01/03/2018 - Review Complete 01/03/2018  Allergen Reaction Noted  . Theophyllines Palpitations 05/13/2010    1. 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 She will return to work on 01/16/18.  2. Activities: She has not been walking much, but will resume walking again soon.    3. Smoking, alcohol, or drugs: None 4. Primary Care Provider: Dr. Tommie Ard Baxley 5. Therapist: Ms. Rea College, MS 6. Bariatrician: Dr. Redgie Grayer, MD  REVIEW OF SYSTEM: There are no other significant problems involving Tat[place]a's other body systems.   Objective:  Vital Signs:  BP 118/74   Pulse 90   Ht 5' 3.62" (1.616 m)   Wt 174 lb 8 oz (79.2 kg)   LMP 05/11/2010   BMI 30.31 kg/m    Ht Readings from Last 3 Encounters:  01/03/18 5' 3.62" (1.616 m)  12/30/17 5\' 3"  (1.6 m)  12/22/17 5\' 3"  (1.6 m)   Wt Readings from Last 3 Encounters:  01/03/18 174 lb 8 oz (79.2 kg)  12/30/17 174 lb (78.9 kg)  12/22/17 169 lb (76.7 kg)   PHYSICAL EXAM:  Constitutional:  Britlyn looks  good today. She is alert and bright. Her affect and insight are very good. She is wearing a sweater and jeans today, but is not wearing make up or lipstick. She has gained 2 pounds since her last visit. Her weight is now just above the nadir of 173.4 pounds that it was in 2010. Eyes: There is no arcus or proptosis.  Mouth: The oropharynx appears normal. The tongue appears normal. There is normal oral moisture. There is no obvious gingivitis. Neck: There are no bruits present. The thyroid gland appears normal in size. The thyroid gland is again within normal limits at 20 grams in size. The consistency of the thyroid gland is normal. There is no thyroid tenderness to palpation. Lungs: The lungs are clear. Air movement is good. Heart: The heart rhythm and rate appear normal. Heart sounds S1 and S2 are normal. I do not appreciate any pathologic heart murmurs. Abdomen: The abdomen is enlarged, but smaller over time. Bowel sounds are normal. The abdomen is soft and non-tender. There is  no obviously palpable hepatomegaly, splenomegaly, or other masses.  Arms: Muscle mass appears appropriate for age.  Hands: She has a 1+ tremor. Phalangeal and metacarpophalangeal joints appear normal. Palms are normal. Legs: Muscle mass appears appropriate for age. There is no edema. She has a large green-yellow bruise lateral to her upper left shin. The bruise is tender to palpation.  Feet: There are no significant deformities. Dorsalis pedis pulses are normal 1+ bilaterally. She has no visible tinea in her great toenails.  Neurologic: Muscle strength is normal for age and gender  in both the upper and the lower extremities. Muscle tone appears normal. Sensation to touch is normal in the legs and feet.    LAB DATA:  Labs 01/03/18: CBG 112 fasting  Lbs 12/02/17: sodium 134, glucose 322, creatinine 1.17  labs 11/16/17: Urine microalbumin/creatinine ratio 4.8; B12 282 (ref [id number]), folate 12.7 (ref >3.0)  Labs 11/01/17: HbA1c 6.9%, CBG 189 post-prandially; PTH 108, calcium 8.6, 25-OH vitamin D 64   Labs 09/13/17: Hepatic function panel normal; CBC normal  Labs 08/09/17: Hepatic function panel normal; CBC normal, except Hct 34.7 (35-45)  Labs 06/14/17: CBG 166  Labs 06/08/17: TSH 1.80, free T4 1.3, free T3 2.3; CMP normal except for glucose 115 and creatinine 1.27; PTH 120, calcium 9.7, 25-OH vitamin D 34;cholesterol 188, triglycerides 110, HDL 67, LDL 100; urine microalbumin/creatinine ratio   Labs 04/12/17: HbA1c 10.0%, CBG 174  Labs 03/04/17: Sodium 131, potassium 4.0, chloride 99, CO2 20, glucose 515, calcium 8.9  Labs 01/11/17: CBG 198  Labs 11/24/16: CBG 468; urine glucose 2000+, negative ketones  Labs 11/17/16: HbA1c 13.4%; TSH 1.54; cholesterol 173, triglycerides 91, HDL 71, LDL 83; 25-OH vitamin D 21; CMP normal except for glucose 314; CBC normal except MCHC 26.5 (ref 27-33)   06/04/16: HBA1c 8.1%, CBG 115  05/27/16: TSH 2.62, free T4 1.1, free T3 2.3; CMP normal except for  glucose 161, and creatinine 1.09; cholesterol 187, triglycerides 77, HDL 74, LDL 98; urinary microalbumin/creatinine ratio 6; PTH 112, calcium 9.2, 25-OH vitamin D 33  03/17/16: CBG 145  Labs 12/31/15: HbA1c 13.4%, CBG 390; CMP normal except for glucose 344 and creatinine 1.14  Labs 10/08/15: HbA1c 9.6%  Labs 07/02/15: HbA1c 8.9%; PTH 107, calcium 9.2, 25-OH vitamin D 33; CBC normal; CMP normal except for creatinine 1.07; cholesterol 174, triglycerides 91, HDL 74, LDL 82; TSH 2.43, free T4 1.2, free T3 2.3  Labs 02/19/15: PTH 99, calcium 8.7, 25-OH vitamin D  20  Labs 10/08/14: HbA1c 8.0%   Labs 08/06/14: HbA1c 7.4%  Labs 08/01/14: TSH 2.014, free T4 0.87, free T3 2.4; PTH 79 (normal 14-64), calcium 9.1; 25-OH vitamin D 35 CMP normal  Labs 05/11/14: HbA1c was 7.7%, without any low BGs; TSH 2.416, free T4 1.13, free T3 2.3  Labs 02/22/14: HbA1c 11.6%  Labs 02/13/14: Calcium 9.0, PTH 131, 25-OH vitamin D 15; CMP normal except for glucose of 165; cholesterol 177, triglycerides 108, HDL 90, LDL 65; urinary microalbumin/creatinine ratio 15.6; TSH 3.639, free T4 1.30, free T3 2.5  Labs 10/10/13: HbA1c is 9.4%, compared with 8.1% at last visit and with 9.3% in December;   Labs 05/08/13: HbA1c 8.1%; PTH 90.2, calcium 9.5, 25-hydroxy vitamin D 46; CMP normal except for glucose of 215; WBC 8.0, RBC 5.20, Hgb 12.5, Hct 37.8%, MCV 72.7 (normal 78-100), iron 55  Labs 02/23/13: CMP normal, except glucose 194 and alkaline phosphatase 126; microalbumin/creatinine ratio was 11.5; TSH 2.086, free T4 1.29, free T3 3.2, TPO antibody < 10; 25-hydroxy vitamin D 18; C-peptide 1.79; cholesterol 156, triglycerides 64, HDL 61, LDL 82  Labs 01/05/13: Hemoglobin A1c was 9.3%, compared with 9.3 at last visit and with 9.1% at the visit prior. All of these values were major increases from 6.6% in May 2012.    Labs 04/14/12: 25-hydroxy vitamin D 21, iron 203,  Vitamin B12 379,   Labs 11/16/11: Hgb 10.3, Hct 32.4%; CMP normal  except glucose 146' cholesterol 201, triglycerides 70, HDL 66, LDL 121; vitamin D 22           Labs 06/09/10: Cholesterol was 175, triglycerides 76, HDL 62, LDL 98. TSH was 1.217. Free T4 was 1.10. Free T3 was 2.7. 25-hydroxy vitamin D was 19. PTH was 167.   Assessment and Plan:   ASSESSMENT:  1. Type 2 diabetes mellitus:   A. Her T2DM was out of control at her December 2017 visit due to her not taking medications reliably and to not exercising. Her HbA1c of 13.4% was probably the highest that it had ever been up to that time..    B. At her visit in May 2018 her HbA1c had decreased to 8.0%. Her BG control improved dramatically when she took her medications and watched her carb intake. Contrave was helping.   C. Unfortunately, she later relapsed in terms of not taking her medications and other maladaptive health behaviors. Her HbA1c increased back to 13.4% as of 11/17/16.   D. After becoming very frightened by her chest pain and admission in February 2019, she has resumed taking her medications and is doing much better in terms of her DM. Her HbA1c of 6.9% in October 2019 was within the  ADA goal range of <7.0%. Since resuming her DM and psych medications and losing weight, her BGs and her mood have improved.  2. Hypoglycemia: She has not had any hypoglycemia symptoms recently, presumable due to not being active at work and to her lower metformin dose. I recommended that she carry at least 40 grams of glucose with her at all times.  3. Obesity: Her weight has increased 2 pounds since her last visit. The Contrave, Victoza, and her other medications are helping her, but she muse exercise more. She needs to continue to take her medications, continue to Eat Right and exercise daily.    4-5. Autonomic neuropathy and tachycardia: The patient's autonomic neuropathy and  heart rate improved as her BGs decreased.    6. Hypertension: Her SBP is good today,  but her DBP is still too high. She still needs to try to  fit in more exercise.  7-8. Anhedonia and anxiety: She is doing much better now, partly due to taking her meds and partly due to working out some of the family problems. She needs to call for help when she begins to do poorly. She also needs to continue to find ways to have more joy in her life. 9-12. GERD/esophagitis/difficulty swallowing/gastroparesis: Her reflux, other GI symptoms, and difficulty swallowing have all improved.   13-15: Hypocalcemia, vitamin D deficiency, and secondary hyperparathyroidism:   A. Due to her bariatric surgery she has more difficulty absorbing vitamin D and calcium. When she develops hypocalcemia and vitamin D deficiency, her parathyroid glands appropriated produce more PTH, resulting in secondary hyperparathyroidism. However, if she takes her calcium and vitamin D all three parameters will normalize.   B. Her calcium was normal in October 2018, but at the lower limit of normal in February 2019. Her vitamin D level was low-normal in May 2019.   C. In May 2019 her PTH was elevated to 120 and her calcium was 9.7. She still had secondary hyperparathyroidism. In October 2019 her calcium was at the low end of the reference range and her PTH was still elevated, but lower. She needs to take her vitamin D and calcium very consistently.  49. Noncompliance: Since last visit her compliance with eating right and taking her medications has greatly improved.   17: Muscle cramps: Her cramps occur only rarely now, which she attributes to being better hydrated. . Her potassium and calcium were normal in May 2019.  18. Hypothyroidism: Her TFTs in May 2019 were normal.   PLAN:  1. Diagnostic: TFTs, calcium, PTH, 25-OH vitamin D prior to next visit.   2. Therapeutic: Continue medications as currently prescribed, to include metformin does of 500 mg in the mornings and 1000 mg in the evenings. Continue Synthroid at bedtime. Take calcium at mealtimes. Take [employer], 50,000 IU per week to improve  her vitamin D level. Try to fit in exercise daily.  See Ms. Showfety in follow up.   3. Patient education: We discussed all of the above at great length. She must continue to take her medications and supplements. She also needs to see me every two months. When she knows that she is coming to see "[facility]", her compliance improves.  4. Follow-up: 2 months   Level of Service: This visit lasted in excess of 55 minutes. More than 50% of the visit was devoted to counseling.   Tillman Sers, MD, CDE Adult and Pediatric Endocrinology 01/03/2018 9:50 AM

## 2018-01-05 ENCOUNTER — Ambulatory Visit (INDEPENDENT_AMBULATORY_CARE_PROVIDER_SITE_OTHER): Payer: 59 | Admitting: Family Medicine

## 2018-01-05 ENCOUNTER — Encounter (INDEPENDENT_AMBULATORY_CARE_PROVIDER_SITE_OTHER): Payer: Self-pay

## 2018-01-05 NOTE — Progress Notes (Signed)
Pt presents with mycotic infection of nails 1-5 bilateral.  All other systems are negative  Laser therapy administered to affected nails and tolerated well. All safety precautions were in place.  2nd treatment.  Follow up in 4 weeks     

## 2018-01-11 ENCOUNTER — Telehealth: Payer: Self-pay | Admitting: Podiatry

## 2018-01-11 NOTE — Telephone Encounter (Signed)
Left message informing pt it took 6-9 months to see good healthy out growth and she is also having the laser, so she may want to wait another 3-5 months and schedule to discuss with DR. Wagoner.

## 2018-01-11 NOTE — Telephone Encounter (Signed)
Pt has been on Lamisil for 4 months and was informed that sometimes the Dr. can do 5 months. Pt wants to know if the Dr. will call in medication for a 5th month. Please give pt a call.

## 2018-01-13 ENCOUNTER — Encounter (INDEPENDENT_AMBULATORY_CARE_PROVIDER_SITE_OTHER): Payer: Self-pay | Admitting: Family Medicine

## 2018-01-13 ENCOUNTER — Ambulatory Visit (INDEPENDENT_AMBULATORY_CARE_PROVIDER_SITE_OTHER): Payer: 59 | Admitting: Family Medicine

## 2018-01-13 VITALS — BP 122/83 | HR 83 | Temp 98.0°F | Ht 63.0 in | Wt 168.0 lb

## 2018-01-13 DIAGNOSIS — M818 Other osteoporosis without current pathological fracture: Secondary | ICD-10-CM

## 2018-01-13 DIAGNOSIS — E669 Obesity, unspecified: Secondary | ICD-10-CM

## 2018-01-13 DIAGNOSIS — Z6830 Body mass index (BMI) 30.0-30.9, adult: Secondary | ICD-10-CM | POA: Diagnosis not present

## 2018-01-13 MED FILL — SULFAMETHOXAZOLE-TMP DS TAB: 800-160 | 7 days supply | Qty: 14 | Fill #0

## 2018-01-13 MED FILL — CARVEDILOL 3.125 MG TABLET: 3.125 | 90 days supply | Qty: 180 | Fill #6

## 2018-01-13 MED FILL — VENTOLIN HFA 90 MCG INHALER: 108 (90 BAS | 25 days supply | Qty: 18 | Fill #1

## 2018-01-13 MED FILL — ALBUTEROL 0.083% INHAL SOLN: (2.5 MG/3ML | 13 days supply | Qty: 150 | Fill #1

## 2018-01-13 MED FILL — traMADol HCL 50 MG TABS: 50 | 30 days supply | Qty: 30 | Fill #1

## 2018-01-13 MED FILL — PREMARIN VAGINAL CREAM-APPL: 0.625 | 15 days supply | Qty: 30 | Fill #1

## 2018-01-13 NOTE — Progress Notes (Signed)
Office: [phone]  /  Fax: [phone]   HPI:   Chief Complaint: OBESITY Alexa [name] is here to discuss her progress with her obesity treatment plan. She is on the Category 2 plan and is following her eating plan approximately 10 % of the time. She states she is exercising 0 minutes 0 times per week. Alexa [name] has done well avoiding weight gain this month, but has not been able to follow her plan closely.  Her weight is 168 lb (76.2 kg) today and has had a weight loss of 1 pound over a period of 1 weeks since her last visit. She has lost 1 lbs since starting treatment with [name].  Osteoporosis Alexa [name] is struggling to take her calcium and vitamin D pills (she takes 4 pills 2 times a day) due to them causing nausea. She is distressed by this and trying to figure out what to do.  ASSESSMENT AND PLAN:  Other osteoporosis, unspecified pathological fracture presence  Class 1 obesity with serious comorbidity and body mass index (BMI) of 30.0 to 30.9 in adult, unspecified obesity type - Starting BMI greater then 30  PLAN:  Osteoporosis [name] was advised to add 8 ounces of [employer]life skim milk (high Calcium milk) 2 times a day and this will enable her to decrease her calcium pills by 50% which she feels is doable. We will continue to monitor. [name] agrees to follow up in 3 to 4 weeks.  I spent > than 50% of the 25 minute visit on counseling as documented in the note.  Obesity Alexa [name] is currently in the action stage of change. As such, her goal is to maintain weight over Christmas and get back on track to a structured plan in January. She has agreed to follow the Category 2 plan. [name] has been instructed to work up to a goal of 150 minutes of combined cardio and strengthening exercise per week for weight loss and overall health benefits. We discussed the following Behavioral Modification Strategies today: increasing lean protein intake, decreasing simple carbohydrates, work on meal planning and  easy cooking plans, and holiday eating strategies.   Alexa [name] has agreed to follow up with our clinic in 3 to 4 weeks. She was informed of the importance of frequent follow up visits to maximize her success with intensive lifestyle modifications for her multiple health conditions.  ALLERGIES: Allergies  Allergen Reactions  . Theophyllines Palpitations    MEDICATIONS: Current Outpatient Medications on File Prior to Visit  Medication Sig Dispense Refill  . albuterol (PROVENTIL HFA;VENTOLIN HFA) 108 (90 Base) MCG/ACT inhaler Inhale 2 puffs into the lungs every 6 (six) hours as needed for wheezing or shortness of breath. 1 Inhaler 11  . albuterol (PROVENTIL) (2.5 MG/3ML) 0.083% nebulizer solution Take 3 mLs (2.5 mg total) by nebulization every 6 (six) hours as needed for wheezing or shortness of breath. 150 mL 1  . Blood Glucose Monitoring Suppl (FREESTYLE LITE) DEVI Use to check glucose 3x daily 2 each 5  . CALCIUM-VITAMIN D PO Take 800 mg by mouth 2 (two) times daily with a meal.     . conjugated estrogens (PREMARIN) vaginal cream Place 1 Applicatorful vaginally at bedtime. Use for 21 days then off for 7 days    . COREG 3.125 MG tablet Take 1 tablet (3.125 mg total) by mouth 2 (two) times daily with a meal. 60 tablet 11  . cyclobenzaprine (FLEXERIL) 10 MG tablet Take 1 tablet (10 mg total) by mouth 3 (three) times daily as needed for  muscle spasms. 90 tablet 0  . escitalopram (LEXAPRO) 20 MG tablet TAKE 1 TABLET (20 MG TOTAL) BY MOUTH DAILY. 90 tablet 1  . Ferrous Sulfate (SLOW FE PO) Take 1 tablet by mouth daily.     Marland Kitchen glucose blood (FREESTYLE LITE) test strip Use to check glucose 3x daily 300 each 1  . HYDROcodone-acetaminophen (NORCO) 10-325 MG tablet Take 1 tablet by mouth every 8 (eight) hours as needed. 30 tablet 0  . ibuprofen (ADVIL,MOTRIN) 200 MG tablet Take 600 mg by mouth daily as needed for headache or moderate pain.    Marland Kitchen levothyroxine (SYNTHROID, LEVOTHROID) 25 MCG tablet TAKE 1  TABLET BY MOUTH ONCE DAILY 90 tablet 1  . metFORMIN (GLUCOPHAGE) 1000 MG tablet TAKE 1 TABLET BY MOUTH 2 TIMES DAILY WITH A MEAL. 180 tablet 1  . Multiple Vitamin (MULTIVITAMIN) tablet Take 1 tablet by mouth daily. Reported on 02/26/2015 30 tablet 0  . ondansetron (ZOFRAN) 4 MG tablet Take 1 tablet (4 mg total) by mouth every 8 (eight) hours as needed for nausea or vomiting. 20 tablet 0  . pantoprazole (PROTONIX) 40 MG tablet TAKE 1 TABLET (40 MG TOTAL) BY MOUTH DAILY. 90 tablet 4  . PREMARIN vaginal cream PLACE 1 APPLICATORFUL VAGINALLY DAILY. 30 g 11  . telmisartan (MICARDIS) 40 MG tablet Take 0.5 tablets (20 mg total) by mouth 2 (two) times daily. 90 tablet 3  . terbinafine (LAMISIL) 250 MG tablet Take 1 tablet (250 mg total) by mouth daily. 30 tablet 0  . traMADol (ULTRAM) 50 MG tablet Take 1 tablet (50 mg total) by mouth daily as needed for moderate pain or severe pain. 30 tablet 1  . VICTOZA 18 MG/3ML SOPN INJECT 1.8 MG DAILY AS DIRECTED 27 mL 1  . Vitamin D, Ergocalciferol, (DRISDOL) 50000 units CAPS capsule Take 50,000 Units by mouth every Sunday.     No current facility-administered medications on file prior to visit.     PAST MEDICAL HISTORY: Past Medical History:  Diagnosis Date  . Anemia, iron deficiency   . Anhedonia   . Anxiety   . Asthma   . Asthma, chronic   . Back pain   . CAD (coronary artery disease)   . Chewing difficulty   . Chronic insomnia   . Combined hyperlipidemia   . Constipation   . Depression   . Diabetes mellitus   . Diabetes mellitus type II   . Diabetic autonomic neuropathy ([place])   . Difficulty swallowing pills   . DM type 2 with diabetic peripheral neuropathy ([place])   . Dry mouth   . Dyspepsia   . Dyspnea   . 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 Essential tremor   . Fatigue   . Fatty liver   . GERD (gastroesophageal reflux disease)   . GERD (gastroesophageal reflux disease)   . Glossitis   . Goiter   . Hemorrhoids   . HLD (hyperlipidemia)   .  Hyperparathyroidism , secondary, non-renal ([place])   . Hypertension   . Hypocalcemia   . Insomnia   . Leg edema   . Leg pain   . Macular degeneration   . Nonproliferative diabetic retinopathy associated with type 2 diabetes mellitus ([place])    RESOLVED  . Obesity, Class III, BMI 40-49.9 (morbid obesity) ([place])   . Osteopenia   . Osteoporosis   . Pallor   . Status post gastric surgery   . Tachycardia   . Thyroiditis, autoimmune   . Vaginal dryness, menopausal   .  Vitamin D deficiency     PAST SURGICAL HISTORY: Past Surgical History:  Procedure Laterality Date  . BREAST REDUCTION SURGERY Bilateral 12/14/2017   Procedure: BILATERAL MAMMARY REDUCTION  (BREAST);  Surgeon: Irene Limbo, MD;  Location: [facility];  Service: Plastics;  Laterality: Bilateral;  . CARDIAC CATHETERIZATION     x 2  . ROUX-EN-Y PROCEDURE    . TONSILLECTOMY      SOCIAL HISTORY: Social History   Tobacco Use  . Smoking status: Never Smoker  . Smokeless tobacco: Never Used  Substance Use Topics  . Alcohol use: No    Alcohol/week: 0.0 standard drinks  . Drug use: No    FAMILY HISTORY: Family History  Problem Relation Age of Onset  . Thyroid disease Mother        Hypothyroid  . Depression Mother   . Liver disease Mother   . Obesity Mother   . Diabetes Father        T2 DM  . Hypertension Father   . Hyperlipidemia Father   . Heart disease Father   . Obesity Brother        250 pounds at a height of 76 inches.  . Thyroid disease Sister        Hypothyroid  . Diabetes Maternal Grandmother   . Colon cancer Other        great grandfather  . Liver disease Maternal Aunt        NASH  . Esophageal cancer Neg Hx   . Pancreatic cancer Neg Hx   . Kidney disease Neg Hx     ROS: Review of Systems  Constitutional: Positive for weight loss.  Gastrointestinal: Positive for nausea.    PHYSICAL EXAM: Blood pressure 122/83, pulse 83, temperature 98 F (36.7 C), temperature source  Oral, height 5\' 3"  (1.6 m), weight 168 lb (76.2 kg), last menstrual period 05/11/2010, SpO2 100 %. Body mass index is 29.76 kg/m. Physical Exam Vitals signs reviewed.  Constitutional:      Appearance: Normal appearance. She is obese.  Cardiovascular:     Rate and Rhythm: Normal rate.  Pulmonary:     Effort: Pulmonary effort is normal.  Musculoskeletal: Normal range of motion.  Skin:    General: Skin is warm and dry.  Neurological:     Mental Status: She is alert and oriented to person, place, and time.  Psychiatric:        Mood and Affect: Mood normal.        Behavior: Behavior normal.     RECENT LABS AND TESTS: BMET    Component Value Date/Time   NA 134 (L) 12/02/2017 1100   K 4.1 12/02/2017 1100   CL 103 12/02/2017 1100   CO2 20 (L) 12/02/2017 1100   GLUCOSE 322 (H) 12/02/2017 1100   BUN 20 12/02/2017 1100   CREATININE 1.17 (H) 12/02/2017 1100   CREATININE 1.27 (H) 06/08/2017 0000   CALCIUM 8.9 12/02/2017 1100   CALCIUM 9.3 08/14/2011 1217   GFRNONAA 51 (L) 12/02/2017 1100   GFRNONAA 67 11/17/2016 0941   GFRAA 59 (L) 12/02/2017 1100   GFRAA 78 11/17/2016 0941   Lab Results  Component Value Date   HGBA1C 6.9 (A) 11/01/2017   HGBA1C 10.0 04/12/2017   HGBA1C 13.4 (H) 11/17/2016   HGBA1C 8.1 06/04/2016   HGBA1C 13.4 12/31/2015   Lab Results  Component Value Date   INSULIN 8.1 11/16/2017   CBC    Component Value Date/Time   WBC 8.1 12/02/2017 1651  RBC 4.13 12/02/2017 1651   HGB 11.8 12/02/2017 1651   HCT 35.3 12/02/2017 1651   PLT 407 (H) 12/02/2017 1651   MCV 85.5 12/02/2017 1651   MCH 28.6 12/02/2017 1651   MCHC 33.4 12/02/2017 1651   RDW 12.6 12/02/2017 1651   LYMPHSABS 2,673 12/02/2017 1651   MONOABS 0.8 10/19/2015 1327   EOSABS 113 12/02/2017 1651   BASOSABS 57 12/02/2017 1651   Iron/TIBC/Ferritin/ %Sat    Component Value Date/Time   IRON 55 05/08/2013 1622   TIBC 357 04/14/2012 1059   IRONPCTSAT 57 (H) 04/14/2012 1059   Lipid Panel      Component Value Date/Time   CHOL 188 06/08/2017 0000   TRIG 110 06/08/2017 0000   HDL 67 06/08/2017 0000   CHOLHDL 2.8 06/08/2017 0000   VLDL 10 03/05/2017 1903   LDLCALC 100 (H) 06/08/2017 0000   Hepatic Function Panel     Component Value Date/Time   PROT 6.3 12/02/2017 1651   ALBUMIN 4.1 05/27/2016 0828   AST 13 12/02/2017 1651   ALT 13 12/02/2017 1651   ALKPHOS 85 05/27/2016 0828   BILITOT 0.3 12/02/2017 1651   BILIDIR 0.1 12/02/2017 1651   IBILI 0.2 12/02/2017 1651      Component Value Date/Time   TSH 1.80 06/08/2017 0000   TSH 0.938 03/05/2017 1903   TSH 1.54 11/17/2016 0941   TSH 2.62 05/27/2016 0828    OBESITY BEHAVIORAL INTERVENTION VISIT  Today's visit was # 4   Starting weight: 169 lbs Starting date: 11/16/17 Today's weight : Weight: 168 lb (76.2 kg)  Today's date: 01/13/2018 Total lbs lost to date: 1  ASK: We discussed the diagnosis of obesity with [name] today and Shatasha agreed to give [name] permission to discuss obesity behavioral modification therapy today.  ASSESS: Ixchel has the diagnosis of obesity and her BMI today is 29.7. Isolde is in the action stage of change.   ADVISE: Gema was educated on the multiple health risks of obesity as well as the benefit of weight loss to improve her health. She was advised of the need for long term treatment and the importance of lifestyle modifications to improve her current health and to decrease her risk of future health problems.  AGREE: Multiple dietary modification options and treatment options were discussed and Fareeda agreed to follow the recommendations documented in the above note.  ARRANGE: Chantrice was educated on the importance of frequent visits to treat obesity as outlined per CMS and USPSTF guidelines and agreed to schedule her next follow up appointment today.  I, Marcille Blanco, am acting as transcriptionist for Starlyn Skeans, MD I have reviewed the above documentation for accuracy and  completeness, and I agree with the above. -Dennard Nip, MD

## 2018-01-14 NOTE — Telephone Encounter (Signed)
Usually we stop at 3 months but I extended to a 4th month. Can you have her send a picture of the nails? Does she feel that it is helping?

## 2018-01-14 NOTE — Telephone Encounter (Signed)
I called pt to inform of Dr. Leigh Aurora statement and recommendations. Pt states she did not understand the message from 12/17/2019and thought I had told her to stop the laser, so she stopped by the office to discuss with the laser nurse. I told pt that my message said that it took 6-9 months to see healthy out grow and because she is also having laser she may want to wait 3-5 months and see Dr. Jacqualyn Posey to see results/improvement. Pt states she will try to send a picture.

## 2018-01-20 MED FILL — SULFAMETHOXAZOLE-TMP DS TAB: 800-160 | 3 days supply | Qty: 6 | Fill #0

## 2018-01-22 NOTE — Progress Notes (Signed)
   Subjective:    Patient ID: Alexa [name], female    DOB: 03/19/1960, 57 y.o.   MRN: [id number]  HPI On November 19 she underwent bilateral breast reduction surgery by Dr. Iran Planas.  She did well with surgery.  Is going [facility]  Apparently has developed acute left back pain which is rather severe.  She has a history of low back pain.  Last episode was July 2017 when she developed right sided sciatica.  In 2015 had an episode of right sided sciatica.  Does not recall any particular event that would have strained her back.  She also had a fall at home while recovering from surgery.    Review of Systems see above.  No weakness or numbness in the left lower extremity     Objective:   Physical Exam  Straight leg raising is positive on the left at 90 degrees.  Appears to be considerable amount of back spasm.  Muscle strength is normal in the left lower extremity.  She is moving slowly.  Unable to elicit deep tendon reflexes sitting in chair.  Has a contusion left lower leg from her recent fall.      Assessment & Plan:  Left lumbar radiculopathy  Plan: Norco 5/325 every 8 hours as needed pain.  Has Flexeril on hand.  Do not want to treat with prednisone since she is still in the healing process from her breast reduction surgery.  If not improving will need to consider physical therapy.  Apply ice or heat to lower leg 20 minutes 2-3 times a day.  Call if not better next week.  15 minutes spent with patient

## 2018-01-31 DIAGNOSIS — Z9889 Other specified postprocedural states: Secondary | ICD-10-CM | POA: Insufficient documentation

## 2018-02-02 ENCOUNTER — Telehealth: Payer: Self-pay | Admitting: Internal Medicine

## 2018-02-02 NOTE — Telephone Encounter (Signed)
Patient called to provide [facility] Referral #[id number] to Dr. Felicita Gage effective 01/31/18 x 1 year.  She'll call back with other to some of her other providers that she has appointments for once she gets those referral numbers.

## 2018-02-10 ENCOUNTER — Encounter (INDEPENDENT_AMBULATORY_CARE_PROVIDER_SITE_OTHER): Payer: Self-pay | Admitting: Family Medicine

## 2018-02-10 ENCOUNTER — Ambulatory Visit (INDEPENDENT_AMBULATORY_CARE_PROVIDER_SITE_OTHER): Payer: No Typology Code available for payment source | Admitting: Family Medicine

## 2018-02-10 VITALS — BP 106/74 | HR 84 | Temp 97.7°F | Ht 63.0 in | Wt 169.0 lb

## 2018-02-10 DIAGNOSIS — E669 Obesity, unspecified: Secondary | ICD-10-CM | POA: Diagnosis not present

## 2018-02-10 DIAGNOSIS — E119 Type 2 diabetes mellitus without complications: Secondary | ICD-10-CM | POA: Diagnosis not present

## 2018-02-10 DIAGNOSIS — Z6830 Body mass index (BMI) 30.0-30.9, adult: Secondary | ICD-10-CM

## 2018-02-14 NOTE — Progress Notes (Signed)
Office: [phone]  /  Fax: [phone]   HPI:   Chief Complaint: OBESITY Natha is here to discuss her progress with her obesity treatment plan. She is on the Category 2 plan and is following her eating plan approximately 10 % of the time. She states she is exercising 0 minutes 0 times per week. Kaelea has done well minimizing weight gain over the holidays. She has struggled to follow her plans up to now, but states now that holidays are over, she is ready to get back on track.  Her weight is 169 lb (76.7 kg) today and has had a weight gain of 1 pound over a period of 4 weeks since her last visit. She has lost 0 lbs since starting treatment with [name].  Diabetes II Layah has a diagnosis of diabetes type II. Thalya is doing well on medications and denies any hypoglycemic episodes. She does not have her blood sugar log today. Her last A1c was 6.9 on 11/01/17. She is ready to work on diet now that the holidays are over.  ASSESSMENT AND PLAN:  Type 2 diabetes mellitus without complication, without long-term current use of insulin (HCC)  Class 1 obesity with serious comorbidity and body mass index (BMI) of 30.0 to 30.9 in adult, unspecified obesity type - Starting BMI greater then 30  PLAN:  Diabetes II Delayna has been given extensive diabetes education by myself today including ideal fasting and post-prandial blood glucose readings, individual ideal Hgb A1c goals, and hypoglycemia prevention. We discussed the importance of good blood sugar control to decrease the likelihood of diabetic complications such as nephropathy, neuropathy, limb loss, blindness, coronary artery disease, and death. We discussed the importance of intensive lifestyle modification including diet, exercise and weight loss as the first line treatment for diabetes. Tehilla agrees to continue her diabetes medications and to start her diet. We will recheck her labs in 1 to 2 months and she will follow up at the agreed upon time  in 2 to 3 weeks.  I spent > than 50% of the 25 minute visit on counseling as documented in the note.  Obesity Verneal is currently in the action stage of change. As such, her goal is to continue with weight loss efforts. She has agreed to strictly  follow the Category 2 plan. Kaisey has been instructed to work up to a goal of 150 minutes of combined cardio and strengthening exercise per week for weight loss and overall health benefits. We discussed the following Behavioral Modification Strategies today: increasing lean protein intake, decreasing simple carbohydrates, work on meal planning and easy cooking plans, and keeping healthy foods in the home.  Keundra has agreed to follow up with our clinic in 2 to 3 weeks. She was informed of the importance of frequent follow up visits to maximize her success with intensive lifestyle modifications for her multiple health conditions.  ALLERGIES: Allergies  Allergen Reactions  . Theophyllines Palpitations    MEDICATIONS: Current Outpatient Medications on File Prior to Visit  Medication Sig Dispense Refill  . albuterol (PROVENTIL HFA;VENTOLIN HFA) 108 (90 Base) MCG/ACT inhaler Inhale 2 puffs into the lungs every 6 (six) hours as needed for wheezing or shortness of breath. 1 Inhaler 11  . albuterol (PROVENTIL) (2.5 MG/3ML) 0.083% nebulizer solution Take 3 mLs (2.5 mg total) by nebulization every 6 (six) hours as needed for wheezing or shortness of breath. 150 mL 1  . Blood Glucose Monitoring Suppl (FREESTYLE LITE) DEVI Use to check glucose 3x daily 2  each 5  . CALCIUM-VITAMIN D PO Take 800 mg by mouth 2 (two) times daily with a meal.     . conjugated estrogens (PREMARIN) vaginal cream Place 1 Applicatorful vaginally at bedtime. Use for 21 days then off for 7 days    . COREG 3.125 MG tablet Take 1 tablet (3.125 mg total) by mouth 2 (two) times daily with a meal. 60 tablet 11  . cyclobenzaprine (FLEXERIL) 10 MG tablet Take 1 tablet (10 mg total) by mouth  3 (three) times daily as needed for muscle spasms. 90 tablet 0  . escitalopram (LEXAPRO) 20 MG tablet TAKE 1 TABLET (20 MG TOTAL) BY MOUTH DAILY. 90 tablet 1  . Ferrous Sulfate (SLOW FE PO) Take 1 tablet by mouth daily.     Marland Kitchen glucose blood (FREESTYLE LITE) test strip Use to check glucose 3x daily 300 each 1  . ibuprofen (ADVIL,MOTRIN) 200 MG tablet Take 600 mg by mouth daily as needed for headache or moderate pain.    Marland Kitchen levothyroxine (SYNTHROID, LEVOTHROID) 25 MCG tablet TAKE 1 TABLET BY MOUTH ONCE DAILY 90 tablet 1  . metFORMIN (GLUCOPHAGE) 1000 MG tablet TAKE 1 TABLET BY MOUTH 2 TIMES DAILY WITH A MEAL. 180 tablet 1  . Multiple Vitamin (MULTIVITAMIN) tablet Take 1 tablet by mouth daily. Reported on 02/26/2015 30 tablet 0  . ondansetron (ZOFRAN) 4 MG tablet Take 1 tablet (4 mg total) by mouth every 8 (eight) hours as needed for nausea or vomiting. 20 tablet 0  . pantoprazole (PROTONIX) 40 MG tablet TAKE 1 TABLET (40 MG TOTAL) BY MOUTH DAILY. 90 tablet 4  . PREMARIN vaginal cream PLACE 1 APPLICATORFUL VAGINALLY DAILY. 30 g 11  . telmisartan (MICARDIS) 40 MG tablet Take 0.5 tablets (20 mg total) by mouth 2 (two) times daily. 90 tablet 3  . traMADol (ULTRAM) 50 MG tablet Take 1 tablet (50 mg total) by mouth daily as needed for moderate pain or severe pain. 30 tablet 1  . VICTOZA 18 MG/3ML SOPN INJECT 1.8 MG DAILY AS DIRECTED 27 mL 1  . Vitamin D, Ergocalciferol, (DRISDOL) 50000 units CAPS capsule Take 50,000 Units by mouth every Sunday.     No current facility-administered medications on file prior to visit.     PAST MEDICAL HISTORY: Past Medical History:  Diagnosis Date  . Anemia, iron deficiency   . Anhedonia   . Anxiety   . Asthma   . Asthma, chronic   . Back pain   . CAD (coronary artery disease)   . Chewing difficulty   . Chronic insomnia   . Combined hyperlipidemia   . Constipation   . Depression   . Diabetes mellitus   . Diabetes mellitus type II   . Diabetic autonomic  neuropathy ([place])   . Difficulty swallowing pills   . DM type 2 with diabetic peripheral neuropathy ([place])   . Dry mouth   . Dyspepsia   . Dyspnea   . 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 Essential tremor   . Fatigue   . Fatty liver   . GERD (gastroesophageal reflux disease)   . GERD (gastroesophageal reflux disease)   . Glossitis   . Goiter   . Hemorrhoids   . HLD (hyperlipidemia)   . Hyperparathyroidism , secondary, non-renal ([place])   . Hypertension   . Hypocalcemia   . Insomnia   . Leg edema   . Leg pain   . Macular degeneration   . Nonproliferative diabetic retinopathy associated with type 2 diabetes  mellitus ([place])    RESOLVED  . Obesity, Class III, BMI 40-49.9 (morbid obesity) ([place])   . Osteopenia   . Osteoporosis   . Pallor   . Status post gastric surgery   . Tachycardia   . Thyroiditis, autoimmune   . Vaginal dryness, menopausal   . Vitamin D deficiency     PAST SURGICAL HISTORY: Past Surgical History:  Procedure Laterality Date  . BREAST REDUCTION SURGERY Bilateral 12/14/2017   Procedure: BILATERAL MAMMARY REDUCTION  (BREAST);  Surgeon: Irene Limbo, MD;  Location: [facility];  Service: Plastics;  Laterality: Bilateral;  . CARDIAC CATHETERIZATION     x 2  . ROUX-EN-Y PROCEDURE    . TONSILLECTOMY     SOCIAL HISTORY: Social History   Tobacco Use  . Smoking status: Never Smoker  . Smokeless tobacco: Never Used  Substance Use Topics  . Alcohol use: No    Alcohol/week: 0.0 standard drinks  . Drug use: No   FAMILY HISTORY: Family History  Problem Relation Age of Onset  . Thyroid disease Mother        Hypothyroid  . Depression Mother   . Liver disease Mother   . Obesity Mother   . Diabetes Father        T2 DM  . Hypertension Father   . Hyperlipidemia Father   . Heart disease Father   . Obesity Brother        250 pounds at a height of 76 inches.  . Thyroid disease Sister        Hypothyroid  . Diabetes Maternal Grandmother   .  Colon cancer Other        great grandfather  . Liver disease Maternal Aunt        NASH  . Esophageal cancer Neg Hx   . Pancreatic cancer Neg Hx   . Kidney disease Neg Hx    ROS: Review of Systems  Constitutional: Negative for weight loss.  Endo/Heme/Allergies:       Negative for hypoglycemia.   PHYSICAL EXAM: Blood pressure 106/74, pulse 84, temperature 97.7 F (36.5 C), temperature source Oral, height 5\' 3"  (1.6 m), weight 169 lb (76.7 kg), last menstrual period 05/11/2010, SpO2 98 %. Body mass index is 29.94 kg/m. Physical Exam Vitals signs reviewed.  Constitutional:      Appearance: Normal appearance. She is obese.  Cardiovascular:     Rate and Rhythm: Normal rate.  Pulmonary:     Effort: Pulmonary effort is normal.  Musculoskeletal: Normal range of motion.  Skin:    General: Skin is warm and dry.  Neurological:     Mental Status: She is alert and oriented to person, place, and time.  Psychiatric:        Mood and Affect: Mood normal.        Behavior: Behavior normal.    RECENT LABS AND TESTS: BMET    Component Value Date/Time   NA 134 (L) 12/02/2017 1100   K 4.1 12/02/2017 1100   CL 103 12/02/2017 1100   CO2 20 (L) 12/02/2017 1100   GLUCOSE 322 (H) 12/02/2017 1100   BUN 20 12/02/2017 1100   CREATININE 1.17 (H) 12/02/2017 1100   CREATININE 1.27 (H) 06/08/2017 0000   CALCIUM 8.9 12/02/2017 1100   CALCIUM 9.3 08/14/2011 1217   GFRNONAA 51 (L) 12/02/2017 1100   GFRNONAA 67 11/17/2016 0941   GFRAA 59 (L) 12/02/2017 1100   GFRAA 78 11/17/2016 0941   Lab Results  Component Value Date  HGBA1C 6.9 (A) 11/01/2017   HGBA1C 10.0 04/12/2017   HGBA1C 13.4 (H) 11/17/2016   HGBA1C 8.1 06/04/2016   HGBA1C 13.4 12/31/2015   Lab Results  Component Value Date   INSULIN 8.1 11/16/2017   CBC    Component Value Date/Time   WBC 8.1 12/02/2017 1651   RBC 4.13 12/02/2017 1651   HGB 11.8 12/02/2017 1651   HCT 35.3 12/02/2017 1651   PLT 407 (H) 12/02/2017 1651    MCV 85.5 12/02/2017 1651   MCH 28.6 12/02/2017 1651   MCHC 33.4 12/02/2017 1651   RDW 12.6 12/02/2017 1651   LYMPHSABS 2,673 12/02/2017 1651   MONOABS 0.8 10/19/2015 1327   EOSABS 113 12/02/2017 1651   BASOSABS 57 12/02/2017 1651   Iron/TIBC/Ferritin/ %Sat    Component Value Date/Time   IRON 55 05/08/2013 1622   TIBC 357 04/14/2012 1059   IRONPCTSAT 57 (H) 04/14/2012 1059   Lipid Panel     Component Value Date/Time   CHOL 188 06/08/2017 0000   TRIG 110 06/08/2017 0000   HDL 67 06/08/2017 0000   CHOLHDL 2.8 06/08/2017 0000   VLDL 10 03/05/2017 1903   LDLCALC 100 (H) 06/08/2017 0000   Hepatic Function Panel     Component Value Date/Time   PROT 6.3 12/02/2017 1651   ALBUMIN 4.1 05/27/2016 0828   AST 13 12/02/2017 1651   ALT 13 12/02/2017 1651   ALKPHOS 85 05/27/2016 0828   BILITOT 0.3 12/02/2017 1651   BILIDIR 0.1 12/02/2017 1651   IBILI 0.2 12/02/2017 1651      Component Value Date/Time   TSH 1.80 06/08/2017 0000   TSH 0.938 03/05/2017 1903   TSH 1.54 11/17/2016 0941   TSH 2.62 05/27/2016 0828   Results for TORRENCE, BRANAGAN (MRN [id number]) as of 02/14/2018 07:23  Ref. Range 11/01/2017 00:00  Vitamin D, 25-Hydroxy Latest Ref Range: 30 - 100 ng/mL 64    OBESITY BEHAVIORAL INTERVENTION VISIT  Today's visit was # 5   Starting weight: 169 lbs Starting date: 11/16/17 Today's weight : Weight: 169 lb (76.7 kg)  Today's date: 02/10/2018 Total lbs lost to date: 0  ASK: We discussed the diagnosis of obesity with [name] today and Franchon agreed to give [name] permission to discuss obesity behavioral modification therapy today.  ASSESS: Kassadee has the diagnosis of obesity and her BMI today is 29.9. Kyah is in the action stage of change.   ADVISE: Debraann was educated on the multiple health risks of obesity as well as the benefit of weight loss to improve her health. She was advised of the need for long term treatment and the importance of lifestyle modifications  to improve her current health and to decrease her risk of future health problems.  AGREE: Multiple dietary modification options and treatment options were discussed and Amiee agreed to follow the recommendations documented in the above note.  ARRANGE: Scout was educated on the importance of frequent visits to treat obesity as outlined per CMS and USPSTF guidelines and agreed to schedule her next follow up appointment today.  I, Marcille Blanco, am acting as transcriptionist for Starlyn Skeans, MD  I have reviewed the above documentation for accuracy and completeness, and I agree with the above. -Dennard Nip, MD

## 2018-02-15 ENCOUNTER — Telehealth: Payer: Self-pay

## 2018-02-15 NOTE — Telephone Encounter (Signed)
She can come in Thursday to discuss. Was last out Dec 5 with back pain and we have no recent FMLA on file for her.

## 2018-02-15 NOTE — Telephone Encounter (Signed)
Patient called states she was out of work yesterday for her back. She said she has intermittent FMLA paperwork if you can fill out for her. I told her she needed to come in for this and she said she was just here last month for her back, but if she needs to she will, but she rather not if she doesn't have to.

## 2018-02-15 NOTE — Telephone Encounter (Signed)
Scheduled

## 2018-02-17 ENCOUNTER — Ambulatory Visit (INDEPENDENT_AMBULATORY_CARE_PROVIDER_SITE_OTHER): Payer: No Typology Code available for payment source | Admitting: Internal Medicine

## 2018-02-17 ENCOUNTER — Encounter: Payer: Self-pay | Admitting: Internal Medicine

## 2018-02-17 VITALS — BP 98/60 | HR 88 | Temp 98.8°F | Ht 63.0 in | Wt 170.3 lb

## 2018-02-17 DIAGNOSIS — M5432 Sciatica, left side: Secondary | ICD-10-CM

## 2018-02-17 NOTE — Progress Notes (Signed)
   Subjective:    Patient ID: Alexa [name], female    DOB: [birth year], 58 y.o.   MRN: [id number]  HPI 58 year old Female with longstanding history of recurrent left sciatica.  Needs to have FMLA form updated.  Was out of work recently on January 20th with recurrent left sciatica.  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    Review of Systems see above     Objective:   Physical Exam  Straight leg raising is slightly positive at 90 degrees but without muscle weakness.      Assessment & Plan:  Recurrent left sciatica-does not want to go to physical therapy at this point time.  Does some exercises when she has exacerbations.  Does not feel she needs medications at this point time either.  FMLA form completed for her employment.  She will be given 1-3 episodes per year and 1 to 7 days per episode.

## 2018-02-17 NOTE — Patient Instructions (Signed)
FMLA form completed at patient request

## 2018-02-24 ENCOUNTER — Ambulatory Visit: Payer: Self-pay

## 2018-02-24 DIAGNOSIS — B351 Tinea unguium: Secondary | ICD-10-CM

## 2018-02-24 DIAGNOSIS — M79676 Pain in unspecified toe(s): Secondary | ICD-10-CM

## 2018-03-01 NOTE — Progress Notes (Signed)
Pt presents with mycotic infection of nails 1-5 bilateral.  All other systems are negative  Laser therapy administered to affected nails and tolerated well. All safety precautions were in place.  3rd treatment.  Follow up in 4 weeks     

## 2018-03-10 ENCOUNTER — Ambulatory Visit (INDEPENDENT_AMBULATORY_CARE_PROVIDER_SITE_OTHER): Payer: No Typology Code available for payment source | Admitting: Bariatrics

## 2018-03-10 ENCOUNTER — Encounter (INDEPENDENT_AMBULATORY_CARE_PROVIDER_SITE_OTHER): Payer: Self-pay

## 2018-03-24 ENCOUNTER — Ambulatory Visit (INDEPENDENT_AMBULATORY_CARE_PROVIDER_SITE_OTHER): Payer: No Typology Code available for payment source | Admitting: "Endocrinology

## 2018-03-24 ENCOUNTER — Ambulatory Visit (INDEPENDENT_AMBULATORY_CARE_PROVIDER_SITE_OTHER): Payer: No Typology Code available for payment source | Admitting: Family Medicine

## 2018-03-24 ENCOUNTER — Encounter (INDEPENDENT_AMBULATORY_CARE_PROVIDER_SITE_OTHER): Payer: Self-pay | Admitting: Family Medicine

## 2018-03-24 ENCOUNTER — Encounter (INDEPENDENT_AMBULATORY_CARE_PROVIDER_SITE_OTHER): Payer: Self-pay | Admitting: "Endocrinology

## 2018-03-24 ENCOUNTER — Ambulatory Visit: Payer: Self-pay

## 2018-03-24 VITALS — BP 126/76 | HR 73 | Temp 97.5°F | Ht 63.0 in | Wt 172.0 lb

## 2018-03-24 VITALS — BP 112/70 | HR 88 | Ht 63.78 in | Wt 176.6 lb

## 2018-03-24 DIAGNOSIS — E1140 Type 2 diabetes mellitus with diabetic neuropathy, unspecified: Secondary | ICD-10-CM | POA: Diagnosis not present

## 2018-03-24 DIAGNOSIS — R000 Tachycardia, unspecified: Secondary | ICD-10-CM | POA: Diagnosis not present

## 2018-03-24 DIAGNOSIS — E1143 Type 2 diabetes mellitus with diabetic autonomic (poly)neuropathy: Secondary | ICD-10-CM

## 2018-03-24 DIAGNOSIS — B351 Tinea unguium: Secondary | ICD-10-CM

## 2018-03-24 DIAGNOSIS — N2581 Secondary hyperparathyroidism of renal origin: Secondary | ICD-10-CM

## 2018-03-24 DIAGNOSIS — I10 Essential (primary) hypertension: Secondary | ICD-10-CM

## 2018-03-24 DIAGNOSIS — E11649 Type 2 diabetes mellitus with hypoglycemia without coma: Secondary | ICD-10-CM | POA: Diagnosis not present

## 2018-03-24 DIAGNOSIS — E669 Obesity, unspecified: Secondary | ICD-10-CM | POA: Diagnosis not present

## 2018-03-24 DIAGNOSIS — R4584 Anhedonia: Secondary | ICD-10-CM

## 2018-03-24 DIAGNOSIS — E1142 Type 2 diabetes mellitus with diabetic polyneuropathy: Secondary | ICD-10-CM

## 2018-03-24 DIAGNOSIS — E559 Vitamin D deficiency, unspecified: Secondary | ICD-10-CM

## 2018-03-24 DIAGNOSIS — K219 Gastro-esophageal reflux disease without esophagitis: Secondary | ICD-10-CM

## 2018-03-24 DIAGNOSIS — R140 Abdominal distension (gaseous): Secondary | ICD-10-CM

## 2018-03-24 DIAGNOSIS — E66811 Obesity, class 1: Secondary | ICD-10-CM

## 2018-03-24 DIAGNOSIS — G479 Sleep disorder, unspecified: Secondary | ICD-10-CM

## 2018-03-24 DIAGNOSIS — Z6830 Body mass index (BMI) 30.0-30.9, adult: Secondary | ICD-10-CM | POA: Diagnosis not present

## 2018-03-24 DIAGNOSIS — K3184 Gastroparesis: Secondary | ICD-10-CM

## 2018-03-24 DIAGNOSIS — R252 Cramp and spasm: Secondary | ICD-10-CM

## 2018-03-24 LAB — POCT GLYCOSYLATED HEMOGLOBIN (HGB A1C): Hemoglobin A1C: 7.7 % — AB (ref 4.0–5.6)

## 2018-03-24 LAB — POCT GLUCOSE (DEVICE FOR HOME USE): POC Glucose: 103 mg/dl — AB (ref 70–99)

## 2018-03-24 MED ORDER — DULAGLUTIDE 0.75 MG/0.5ML ~~LOC~~ SOAJ: SUBCUTANEOUS | 3 refills | Status: AC

## 2018-03-24 MED FILL — TRULICITY 0.75 MG/0.5 ML PE: 0.75 | 28 days supply | Qty: 2 | Fill #0

## 2018-03-24 MED FILL — VICTOZA 18 MG/3 ML INJECT P: 18 | 90 days supply | Qty: 27 | Fill #1

## 2018-03-24 NOTE — Patient Instructions (Signed)
Follow up visit in early may 2020.

## 2018-03-24 NOTE — Progress Notes (Signed)
Office: [phone]  /  Fax: [phone]   HPI:   Chief Complaint: OBESITY Alexa [name] is here to discuss her progress with her obesity treatment plan. She is on the Category 2 plan and is following her eating plan approximately 10 % of the time. She states she is walking for 60 minutes 2 times per week. Alexa [name] has been drinking soda and eating too much sugar. She is not sticking to the plan well.  Her weight is 172 lb (78 kg) today and has gained 3 pounds since her last visit. She has lost 0 lbs since starting treatment with [name].  Diabetes II with Peripheral Neuropathy Alexa [name] has a diagnosis of diabetes type II. Alexa [name] is on metformin and Trulicity. She saw Endocrinology today and POC A1c was 7.7. She is motivated to stick to the plan and reduce soda and sweets.  ASSESSMENT AND PLAN:  Type 2 diabetes mellitus with diabetic neuropathy, without long-term current use of insulin (HCC)  Class 1 obesity with serious comorbidity and body mass index (BMI) of 30.0 to 30.9 in adult, unspecified obesity type  PLAN:  Diabetes II with Peripheral Neuropathy Alexa [name] has been given extensive diabetes education by myself today including ideal fasting and post-prandial blood glucose readings, individual ideal Hgb A1c goals and hypoglycemia prevention. We discussed the importance of good blood sugar control to decrease the likelihood of diabetic complications such as nephropathy, neuropathy, limb loss, blindness, coronary artery disease, and death. We discussed the importance of intensive lifestyle modification including diet, exercise and weight loss as the first line treatment for diabetes. Alexa [name] agrees to continue her diabetes medications and she agrees to follow up with our clinic in 3 weeks.  I spent > than 50% of the 15 minute visit on counseling as documented in the note.  Obesity Alexa [name] is currently in the action stage of change. As such, her goal is to continue with weight loss efforts She has  agreed to follow the Category 2 plan + lunch options Alexa [name] will continue current exercise regimen for weight loss and overall health benefits. We discussed the following Behavioral Modification Strategies today: decreasing simple carbohydrates, decrease liquid calories, and planning for success   Alexa [name] has agreed to follow up with our clinic in 3 weeks. She was informed of the importance of frequent follow up visits to maximize her success with intensive lifestyle modifications for her multiple health conditions.  ALLERGIES: Allergies  Allergen Reactions  . Theophyllines Palpitations    MEDICATIONS: Current Outpatient Medications on File Prior to Visit  Medication Sig Dispense Refill  . albuterol (PROVENTIL) (2.5 MG/3ML) 0.083% nebulizer solution Take 3 mLs (2.5 mg total) by nebulization every 6 (six) hours as needed for wheezing or shortness of breath. 150 mL 1  . Blood Glucose Monitoring Suppl (FREESTYLE LITE) DEVI Use to check glucose 3x daily 2 each 5  . CALCIUM-VITAMIN D PO Take 800 mg by mouth 2 (two) times daily with a meal.     . conjugated estrogens (PREMARIN) vaginal cream Place 1 Applicatorful vaginally at bedtime. Use for 21 days then off for 7 days    . COREG 3.125 MG tablet Take 1 tablet (3.125 mg total) by mouth 2 (two) times daily with a meal. 60 tablet 11  . cyclobenzaprine (FLEXERIL) 10 MG tablet Take 1 tablet (10 mg total) by mouth 3 (three) times daily as needed for muscle spasms. 90 tablet 0  . Dulaglutide (TRULICITY) 3.21 YY/4.8GN SOPN Use once weekly. 4 pen 3  . escitalopram (LEXAPRO)  20 MG tablet TAKE 1 TABLET (20 MG TOTAL) BY MOUTH DAILY. 90 tablet 1  . Ferrous Sulfate (SLOW FE PO) Take 1 tablet by mouth daily.     Marland Kitchen glucose blood (FREESTYLE LITE) test strip Use to check glucose 3x daily 300 each 1  . ibuprofen (ADVIL,MOTRIN) 200 MG tablet Take 600 mg by mouth daily as needed for headache or moderate pain.    Marland Kitchen levothyroxine (SYNTHROID, LEVOTHROID) 25 MCG tablet  TAKE 1 TABLET BY MOUTH ONCE DAILY 90 tablet 1  . metFORMIN (GLUCOPHAGE) 1000 MG tablet TAKE 1 TABLET BY MOUTH 2 TIMES DAILY WITH A MEAL. 180 tablet 1  . Multiple Vitamin (MULTIVITAMIN) tablet Take 1 tablet by mouth daily. Reported on 02/26/2015 30 tablet 0  . ondansetron (ZOFRAN) 4 MG tablet Take 1 tablet (4 mg total) by mouth every 8 (eight) hours as needed for nausea or vomiting. 20 tablet 0  . pantoprazole (PROTONIX) 40 MG tablet TAKE 1 TABLET (40 MG TOTAL) BY MOUTH DAILY. 90 tablet 4  . telmisartan (MICARDIS) 40 MG tablet Take 0.5 tablets (20 mg total) by mouth 2 (two) times daily. 90 tablet 3  . traMADol (ULTRAM) 50 MG tablet Take 1 tablet (50 mg total) by mouth daily as needed for moderate pain or severe pain. 30 tablet 1  . VICTOZA 18 MG/3ML SOPN INJECT 1.8 MG DAILY AS DIRECTED 27 mL 1  . Vitamin D, Ergocalciferol, (DRISDOL) 50000 units CAPS capsule Take 50,000 Units by mouth every Sunday.    Marland Kitchen albuterol (PROVENTIL HFA;VENTOLIN HFA) 108 (90 Base) MCG/ACT inhaler Inhale 2 puffs into the lungs every 6 (six) hours as needed for wheezing or shortness of breath. (Patient not taking: Reported on 03/24/2018) 1 Inhaler 11   No current facility-administered medications on file prior to visit.     PAST MEDICAL HISTORY: Past Medical History:  Diagnosis Date  . Anemia, iron deficiency   . Anhedonia   . Anxiety   . Asthma   . Asthma, chronic   . Back pain   . CAD (coronary artery disease)   . Chewing difficulty   . Chronic insomnia   . Combined hyperlipidemia   . Constipation   . Depression   . Diabetes mellitus   . Diabetes mellitus type II   . Diabetic autonomic neuropathy ([place])   . Difficulty swallowing pills   . DM type 2 with diabetic peripheral neuropathy ([place])   . Dry mouth   . Dyspepsia   . Dyspnea   . 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 Essential tremor   . Fatigue   . Fatty liver   . GERD (gastroesophageal reflux disease)   . GERD (gastroesophageal reflux disease)   .  Glossitis   . Goiter   . Hemorrhoids   . HLD (hyperlipidemia)   . Hyperparathyroidism , secondary, non-renal ([place])   . Hypertension   . Hypocalcemia   . Insomnia   . Leg edema   . Leg pain   . Macular degeneration   . Nonproliferative diabetic retinopathy associated with type 2 diabetes mellitus ([place])    RESOLVED  . Obesity, Class III, BMI 40-49.9 (morbid obesity) ([place])   . Osteopenia   . Osteoporosis   . Pallor   . Status post gastric surgery   . Tachycardia   . Thyroiditis, autoimmune   . Vaginal dryness, menopausal   . Vitamin D deficiency     PAST SURGICAL HISTORY: Past Surgical History:  Procedure Laterality Date  . BREAST REDUCTION SURGERY Bilateral  12/14/2017   Procedure: BILATERAL MAMMARY REDUCTION  (BREAST);  Surgeon: Irene Limbo, MD;  Location: [facility];  Service: Plastics;  Laterality: Bilateral;  . CARDIAC CATHETERIZATION     x 2  . ROUX-EN-Y PROCEDURE    . TONSILLECTOMY      SOCIAL HISTORY: Social History   Tobacco Use  . Smoking status: Never Smoker  . Smokeless tobacco: Never Used  Substance Use Topics  . Alcohol use: No    Alcohol/week: 0.0 standard drinks  . Drug use: No    FAMILY HISTORY: Family History  Problem Relation Age of Onset  . Thyroid disease Mother        Hypothyroid  . Depression Mother   . Liver disease Mother   . Obesity Mother   . Diabetes Father        T2 DM  . Hypertension Father   . Hyperlipidemia Father   . Heart disease Father   . Obesity Brother        250 pounds at a height of 76 inches.  . Thyroid disease Sister        Hypothyroid  . Diabetes Maternal Grandmother   . Colon cancer Other        great grandfather  . Liver disease Maternal Aunt        NASH  . Esophageal cancer Neg Hx   . Pancreatic cancer Neg Hx   . Kidney disease Neg Hx     ROS: Review of Systems  Constitutional: Negative for weight loss.  Endo/Heme/Allergies:       Negative hypoglycemia    PHYSICAL  EXAM: Blood pressure 126/76, pulse 73, temperature (!) 97.5 F (36.4 C), height 5\' 3"  (1.6 m), weight 172 lb (78 kg), last menstrual period 05/11/2010, SpO2 100 %. Body mass index is 30.47 kg/m. Physical Exam Vitals signs reviewed.  Constitutional:      Appearance: Normal appearance. She is obese.  Cardiovascular:     Rate and Rhythm: Normal rate.     Pulses: Normal pulses.  Pulmonary:     Effort: Pulmonary effort is normal.     Breath sounds: Normal breath sounds.  Musculoskeletal: Normal range of motion.  Skin:    General: Skin is warm and dry.  Neurological:     Mental Status: She is alert and oriented to person, place, and time.  Psychiatric:        Mood and Affect: Mood normal.        Behavior: Behavior normal.     RECENT LABS AND TESTS: BMET    Component Value Date/Time   NA 134 (L) 12/02/2017 1100   K 4.1 12/02/2017 1100   CL 103 12/02/2017 1100   CO2 20 (L) 12/02/2017 1100   GLUCOSE 322 (H) 12/02/2017 1100   BUN 20 12/02/2017 1100   CREATININE 1.17 (H) 12/02/2017 1100   CREATININE 1.27 (H) 06/08/2017 0000   CALCIUM 8.9 12/02/2017 1100   CALCIUM 9.3 08/14/2011 1217   GFRNONAA 51 (L) 12/02/2017 1100   GFRNONAA 67 11/17/2016 0941   GFRAA 59 (L) 12/02/2017 1100   GFRAA 78 11/17/2016 0941   Lab Results  Component Value Date   HGBA1C 7.7 (A) 03/24/2018   HGBA1C 6.9 (A) 11/01/2017   HGBA1C 10.0 04/12/2017   HGBA1C 13.4 (H) 11/17/2016   HGBA1C 8.1 06/04/2016   Lab Results  Component Value Date   INSULIN 8.1 11/16/2017   CBC    Component Value Date/Time   WBC 8.1 12/02/2017 1651   RBC  4.13 12/02/2017 1651   HGB 11.8 12/02/2017 1651   HCT 35.3 12/02/2017 1651   PLT 407 (H) 12/02/2017 1651   MCV 85.5 12/02/2017 1651   MCH 28.6 12/02/2017 1651   MCHC 33.4 12/02/2017 1651   RDW 12.6 12/02/2017 1651   LYMPHSABS 2,673 12/02/2017 1651   MONOABS 0.8 10/19/2015 1327   EOSABS 113 12/02/2017 1651   BASOSABS 57 12/02/2017 1651   Iron/TIBC/Ferritin/ %Sat     Component Value Date/Time   IRON 55 05/08/2013 1622   TIBC 357 04/14/2012 1059   IRONPCTSAT 57 (H) 04/14/2012 1059   Lipid Panel     Component Value Date/Time   CHOL 188 06/08/2017 0000   TRIG 110 06/08/2017 0000   HDL 67 06/08/2017 0000   CHOLHDL 2.8 06/08/2017 0000   VLDL 10 03/05/2017 1903   LDLCALC 100 (H) 06/08/2017 0000   Hepatic Function Panel     Component Value Date/Time   PROT 6.3 12/02/2017 1651   ALBUMIN 4.1 05/27/2016 0828   AST 13 12/02/2017 1651   ALT 13 12/02/2017 1651   ALKPHOS 85 05/27/2016 0828   BILITOT 0.3 12/02/2017 1651   BILIDIR 0.1 12/02/2017 1651   IBILI 0.2 12/02/2017 1651      Component Value Date/Time   TSH 1.80 06/08/2017 0000   TSH 0.938 03/05/2017 1903   TSH 1.54 11/17/2016 0941   TSH 2.62 05/27/2016 0828      OBESITY BEHAVIORAL INTERVENTION VISIT  Today's visit was # 6   Starting weight: 169 lbs Starting date: 11/16/17 Today's weight : 172 lbs  Today's date: 03/24/2018 Total lbs lost to date: 0    03/24/2018 1100  Height 5\' 3"  (1.6 m)  Weight 172 lb (78 kg)  BMI (Calculated) 30.48   Body Fat % 45.9 %  Total Body Water (lbs) 69.8 lbs     ASK: We discussed the diagnosis of obesity with [name] today and Alexa [name] agreed to give [name] permission to discuss obesity behavioral modification therapy today.  ASSESS: Alexa [name] has the diagnosis of obesity and her BMI today is 30.48 Alexa [name] is in the action stage of change   ADVISE: Alexa [name] was educated on the multiple health risks of obesity as well as the benefit of weight loss to improve her health. She was advised of the need for long term treatment and the importance of lifestyle modifications to improve her current health and to decrease her risk of future health problems.  AGREE: Multiple dietary modification options and treatment options were discussed and  Alexa [name] agreed to follow the recommendations documented in the above note.  ARRANGE: Alexa [name] was educated on the  importance of frequent visits to treat obesity as outlined per CMS and USPSTF guidelines and agreed to schedule her next follow up appointment today.  Wilhemena Durie, am acting as Location manager for [employer], FNP-C.  I have reviewed the above documentation for accuracy and completeness, and I agree with the above.  - Samson Ralph, FNP-C.

## 2018-03-24 NOTE — Progress Notes (Signed)
Subjective:  Patient Name: Alexa [name] Date of Birth: December 30, 1960  MRN: [id number]  Alexa [name]  presents to the office today for follow-up of her type 2 diabetes mellitus, obesity, combined hyperlipidemia, GERD, hypertension, ASHD, dyspepsia, pedal edema, non-proliferative diabetic retinopathy, goiter, depression, autonomic neuropathy, tachycardia, peripheral neuropathy, acquired hypothyroidism, vitamin D deficiency, hypocalcemia, secondary hyperparathyroidism, glossitis, pallor, fatigue, iron deficiency anemia, anhedonia, disinclination to take medicines, and status post Roux-en-Y gastric bypass.   HISTORY OF PRESENT ILLNESS:   Alexa [name] is a 58 y.o. Caucasian woman. Alexa [name] was unaccompanied.  1. The patient first presented to me on 08/20/04 in referral from her primary care internist, Dr. Emeline General, for evaluation and management of her type 2 diabetes, obesity, and multiple medical issues. She was 58 years old.   Alexa [name] had a long history of obesity. She was heavy as a child. She underwent menarche at age 43. At age 52 she was 180 pounds. In 1996 she weighed 218 pounds. In 1999 she was diagnosed with type 2 diabetes mellitus. Her weight at that point was 250 pounds. She was treated with Glucophage, and Actos. Actos made her gain even more weight. She had also been treated with glipizide in the past. More recently she had been treated with Lantus and Glucophage plus regular insulin as needed, especially for when she took steroids for asthma attacks. Maximum weight had been 296 pounds one month prior to that first visit with me. Her tendency to gain weight and her difficulty in losing weight were aggravated by long-standing, intermittent depression and by severe, recurrent asthma requiring the use of steroid medications.  B. Past medical history was also positive for a 60% blockage in one of her coronary arteries. She had significant issues with GERD and dyspepsia. She also had combined  hyperlipidemia. She had a previous cardiac catheterization and previous tonsillectomy. She was allergic to theophylline. Her pertinent review of systems was positive for some numbness and tingling in her feet. Family history was positive for type 2 diabetes in her father and her maternal grandmother. Her brother weighed 250 pounds at a height of 76 inches. Both her mother and her sister were hypothyroid.  C. On physical examination, her weight was 279.9 pounds. Her BMI was 49.6. Her blood pressure was 142/86. Her heart rate was 96. Her hemoglobin A1c was 9.8%. She was alert and oriented x3. Her affect was normal. Her insight was fair. She was obviously quite obese. She had a 25 gm thyroid gland. She had 1+ tremor of her hands. She had 1+ DP pulses and 2+ tinea pedis. Sensation to touch was intact in her feet. Laboratory data included a normal CMP. Her cholesterol was 175, triglycerides 76, HDL 53, and LDL 107. Her TSH was 2.98. Since she obviously did have type 2 diabetes mellitus associated with morbid obesity, and since weight loss was a major factor for her, I asked her to resume her metformin twice daily. I also started her on Byetta, initially 5 mcg twice daily and later 10 mcg twice daily. I discontinued her Lantus insulin.  2. During the last 13 years,we have had some successes, some failures, and some new problem areas.  A. T2DM: In October 2007 the combination of Byetta and metformin was causing more gastrointestinal problems. She opted to stop the metformin and continue the Byetta because it was helping her with weight control and blood sugar control. By 08/11/06 her weight had decreased to 267.6 pounds. Hemoglobin A1c was 8.6%. At that point  she decided to have bariatric surgery. She had a Roux-en-Y gastric bypass on 12/20/2006. She subsequently lost weight down to 173.4 pounds on 05/23/08, but then subsequently regained weight to the 190s. Her  hemoglobin A1c values varied in parallel with her weights.  On 05/23/08, at the point of her lowest weight, her hemoglobin A1c dropped to a nadir of 6.3%.  Since then her hemoglobin A1c values have varied between 6.6 and 13.4%. Although we were initially able to stop all of her diabetes medicines after bariatric surgery, when she began to regain weight we restarted metformin, 500 mg twice daily. Although she took her metformin twice daily for a long time, she had discontinued it many years ago. She hated to take medicines and could not make herself be compliant in taking medications or exercising. When her HbA1c increased to 9.4% in September 2015, I stopped her Byetta injections and started her on liraglutide (Victoza) daily injections on 11/16/13. After several more years of noncompliance, she has been doing much better in the past year.   B. Vitamin D deficiency, hypocalcemia, and secondary hyperparathyroidism: Just before her gastric bypass, I obtained baseline bone mineral metabolism studies. Her 25-hydroxy vitamin D was very low at 7 (normal greater than or equal to 30). Her calcium was 9.5 (normal 8.6-10.6). Her parathyroid hormone was 38.4 (normal 14-72). Subsequent to her surgery, the patient was supposed to be taking multivitamins with calcium and vitamin D, but did not always do so. On 03/29/2007 her 25-hydroxy vitamin D was 29, but her calcium decreased to 8.7. Her PTH was slightly elevated at 73.5. Her 1,25-hydroxy vitamin D was 46. Her iron was 56. I asked her to make sure she took her multivitamins and calcium daily. Unfortunately, she has often been non-compliant with her multivitamins and calcium. Her 25-hydroxy vitamin D values have varied between 15-35. Her calcium values have varied between 8.1-9.4. Her PTH values have remained elevated between 79-167. Alexa [name]'s iron levels have also been low, between 32-34. She was supposed to be taking iron every day, but frequently did not do so. In the past year, however, she has been doing better at taking her vitamin  D, calcium, and iron. In May 2019 her vitamin D was at the lower end of the reference range, her calcium was about the 40% of the reference range, but her PTH was still significantly elevated.    Alexa [name] has had psychological issues for many years that have adversely affected her eating, her unwillingness to exercise, her noncompliance with taking medications, her weight, and her T2DM care. Fortunately, her recent outpatient psychological therapy with Ms. Rea College, RN, MS and her taking Lexapro regularly have resulted in Alexa [name] having a much more positive attitude about life in general and a generally more positive attitude about taking her medications. Fortunately, Alexa [name] has been dealing successfully with her issues and her compliance has markedly improved.   D. Obesity: Although Tianni regained a great deal of weight in the years following her bariatric surgery, in the past year she has been taking Contrave and has been able to lose almost all of the weight that she had gained after her bariatric surgery.   3. Alexa [name]'s last [facility] visit was on 01/03/18. At that visit I asked her to continue her morning dose of metformin of 500 mg and continue the evening dose of 1000 mg. I continued her Synthroid dose at bedtime. I asked her to take one [employer], 50,000 IU capsule of vitamin D weekly and calcium at  each meal. She is taking more calcium and is taking the [employer] twice daily in an effort to boost her calcium.   A. She has been healthy, but "overly tired".   B. She is still recovering form her breast reduction surgery on 12/13/17. She still has an open wound that is slowly closing. She is still being followed by her surgeon.   C. She has had more early awakening, insomnia, fitful sleeping, and dreams recently. She is drinking much more regular Coke.   D. She not yet resumed taking Contrave, 2 pills, twice daily very soon.   Shawn Route remains in counseling with Ms. Showfety every month.    F . She  has not had much of a problem with asthma this year.    G. She still has some hot flashes, but "not as bad as it was".  H. She has had "horrible" leg cramps during the night recently. The pains in her left shin resolved.   I. She has been walking more.   J. She is taking Lexapro, 20 mg/day; Synthroid, 25 mcg/day; Micardis, 20 mg, twice daily; Victoza,1.8 mg/day; Protonix, 40 mg/day; metformin 500 mg on the morning and 1000 mg in the evening twice daily. She takes one 800+ mg Citracal tablet twice daily. She is also taking Coreg, 3.125 mg, twice daily. She is also taking B12 daily. Her insurance requires her to switch from Victoza to [employer].   K. She is doing very well at not allowing herself to become over-committed. She is trying to achieve more balance.   L. She has not had any low BG symptom recently  M. She is having laser treatments now for her toe fungus. She stopped the terbinafine.   4. Review of Systems: There are no other significant issues  Constitutional: Alexa [name] feels "really tired".  Life is going well.  Eyes: Vision has been okay. She had an eye exam during the Summer. No diabetic eye disease was noted. Neck: She occasionally has trouble swallowing, but less often. The patient has no complaints of anterior neck swelling, soreness, tenderness, pressure, or discomfort.  Heart: She saw her cardiologist in August and will have a follow up visit in March 2020. She no longer has  faster heart rates when she is anxious. She has not had any further chest pain or pressure. Heart rate increases with exercise or other physical activity. She has no complaints of palpitations or irregular heat beats Gastrointestinal: She no longer wakes up nauseous very often. She rarely  has the sensation of stomach pains. She has not had any post-prandial bloating recently. She does not have any excessive head hunger or belly hunger, acid reflux, upset stomach, stomach aches or pains, swallowing difficulties,  diarrhea, or constipation.  Legs: As above. There are no complaints of numbness, tingling, or burning. No edema is noted. Feet: There are no complaints of numbness, tingling, burning, or pain. No edema is noted. Tinea improved with her new laser therapy.   Neuro: No new sensory or muscular problems Psych: She is doing well.    GU: She has not been urinating a lot. She has had more nocturia since increasing the amount of coke she drinks.   Hypoglycemia: None recently.    5. BG printout: Her BG meter is broken.   PAST MEDICAL, FAMILY, AND SOCIAL HISTORY:  Past Medical History:  Diagnosis Date  . Anemia, iron deficiency   . Anhedonia   . Anxiety   . Asthma   . Asthma, chronic   .  Back pain   . CAD (coronary artery disease)   . Chewing difficulty   . Chronic insomnia   . Combined hyperlipidemia   . Constipation   . Depression   . Diabetes mellitus   . Diabetes mellitus type II   . Diabetic autonomic neuropathy ([place])   . Difficulty swallowing pills   . DM type 2 with diabetic peripheral neuropathy ([place])   . Dry mouth   . Dyspepsia   . Dyspnea   . Elevated homocysteine ([place])   . Essential tremor   . Fatigue   . Fatty liver   . GERD (gastroesophageal reflux disease)   . GERD (gastroesophageal reflux disease)   . Glossitis   . Goiter   . Hemorrhoids   . HLD (hyperlipidemia)   . Hyperparathyroidism , secondary, non-renal ([place])   . Hypertension   . Hypocalcemia   . Insomnia   . Leg edema   . Leg pain   . Macular degeneration   . Nonproliferative diabetic retinopathy associated with type 2 diabetes mellitus ([place])    RESOLVED  . Obesity, Class III, BMI 40-49.9 (morbid obesity) ([place])   . Osteopenia   . Osteoporosis   . Pallor   . Status post gastric surgery   . Tachycardia   . Thyroiditis, autoimmune   . Vaginal dryness, menopausal   . Vitamin D deficiency     Family History  Problem Relation Age of Onset  . Thyroid disease Mother        Hypothyroid  .  Depression Mother   . Liver disease Mother   . Obesity Mother   . Diabetes Father        T2 DM  . Hypertension Father   . Hyperlipidemia Father   . Heart disease Father   . Obesity Brother        250 pounds at a height of 76 inches.  . Thyroid disease Sister        Hypothyroid  . Diabetes Maternal Grandmother   . Colon cancer Other        great grandfather  . Liver disease Maternal Aunt        NASH  . Esophageal cancer Neg Hx   . Pancreatic cancer Neg Hx   . Kidney disease Neg Hx      Current Outpatient Medications:  .  Blood Glucose Monitoring Suppl (FREESTYLE LITE) DEVI, Use to check glucose 3x daily, Disp: 2 each, Rfl: 5 .  CALCIUM-VITAMIN D PO, Take 800 mg by mouth 2 (two) times daily with a meal. , Disp: , Rfl:  .  conjugated estrogens (PREMARIN) vaginal cream, Place 1 Applicatorful vaginally at bedtime. Use for 21 days then off for 7 days, Disp: , Rfl:  .  COREG 3.125 MG tablet, Take 1 tablet (3.125 mg total) by mouth 2 (two) times daily with a meal., Disp: 60 tablet, Rfl: 11 .  escitalopram (LEXAPRO) 20 MG tablet, TAKE 1 TABLET (20 MG TOTAL) BY MOUTH DAILY., Disp: 90 tablet, Rfl: 1 .  Ferrous Sulfate (SLOW FE PO), Take 1 tablet by mouth daily. , Disp: , Rfl:  .  glucose blood (FREESTYLE LITE) test strip, Use to check glucose 3x daily, Disp: 300 each, Rfl: 1 .  levothyroxine (SYNTHROID, LEVOTHROID) 25 MCG tablet, TAKE 1 TABLET BY MOUTH ONCE DAILY, Disp: 90 tablet, Rfl: 1 .  metFORMIN (GLUCOPHAGE) 1000 MG tablet, TAKE 1 TABLET BY MOUTH 2 TIMES DAILY WITH A MEAL., Disp: 180 tablet, Rfl: 1 .  Multiple Vitamin (MULTIVITAMIN)  tablet, Take 1 tablet by mouth daily. Reported on 02/26/2015, Disp: 30 tablet, Rfl: 0 .  pantoprazole (PROTONIX) 40 MG tablet, TAKE 1 TABLET (40 MG TOTAL) BY MOUTH DAILY., Disp: 90 tablet, Rfl: 4 .  telmisartan (MICARDIS) 40 MG tablet, Take 0.5 tablets (20 mg total) by mouth 2 (two) times daily., Disp: 90 tablet, Rfl: 3 .  VICTOZA 18 MG/3ML SOPN, INJECT 1.8  MG DAILY AS DIRECTED, Disp: 27 mL, Rfl: 1 .  Vitamin D, Ergocalciferol, (DRISDOL) 50000 units CAPS capsule, Take 50,000 Units by mouth every Sunday., Disp: , Rfl:  .  albuterol (PROVENTIL HFA;VENTOLIN HFA) 108 (90 Base) MCG/ACT inhaler, Inhale 2 puffs into the lungs every 6 (six) hours as needed for wheezing or shortness of breath. (Patient not taking: Reported on 03/24/2018), Disp: 1 Inhaler, Rfl: 11 .  albuterol (PROVENTIL) (2.5 MG/3ML) 0.083% nebulizer solution, Take 3 mLs (2.5 mg total) by nebulization every 6 (six) hours as needed for wheezing or shortness of breath. (Patient not taking: Reported on 03/24/2018), Disp: 150 mL, Rfl: 1 .  cyclobenzaprine (FLEXERIL) 10 MG tablet, Take 1 tablet (10 mg total) by mouth 3 (three) times daily as needed for muscle spasms. (Patient not taking: Reported on 03/24/2018), Disp: 90 tablet, Rfl: 0 .  ibuprofen (ADVIL,MOTRIN) 200 MG tablet, Take 600 mg by mouth daily as needed for headache or moderate pain., Disp: , Rfl:  .  ondansetron (ZOFRAN) 4 MG tablet, Take 1 tablet (4 mg total) by mouth every 8 (eight) hours as needed for nausea or vomiting. (Patient not taking: Reported on 03/24/2018), Disp: 20 tablet, Rfl: 0 .  traMADol (ULTRAM) 50 MG tablet, Take 1 tablet (50 mg total) by mouth daily as needed for moderate pain or severe pain. (Patient not taking: Reported on 03/24/2018), Disp: 30 tablet, Rfl: 1  Allergies as of 03/24/2018 - Review Complete 03/24/2018  Allergen Reaction Noted  . Theophyllines Palpitations 05/13/2010    1. 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  2. Activities: She has been walking more.     3. Smoking, alcohol, or drugs: None 4. Primary Care Provider: Dr. Tommie Ard Baxley 5. Therapist: Ms. Rea College, MS 6. Bariatrician: Dr. Redgie Grayer, MD  REVIEW OF SYSTEM: There are no other significant problems involving  Alexa [name]'s other body systems.   Objective:  Vital Signs:  BP 112/70   Pulse 88   Ht 5' 3.78" (1.62 m)   Wt 176 lb 9.6 oz (80.1 kg)   LMP 05/11/2010   BMI 30.52 kg/m    Ht Readings from Last 3 Encounters:  03/24/18 5' 3.78" (1.62 m)  02/17/18 5\' 3"  (1.6 m)  02/10/18 5\' 3"  (1.6 m)   Wt Readings from Last 3 Encounters:  03/24/18 176 lb 9.6 oz (80.1 kg)  02/17/18 170 lb 5 oz (77.3 kg)  02/10/18 169 lb (76.7 kg)   PHYSICAL EXAM:  Constitutional:  F[place]ina looks good today. She is alert and bright. Her affect and insight are very good. She is wearing a sweater and slacks again today, but is not wearing make up or lipstick. She has gained 2 pounds since her last visit. Her weight is now just above the nadir of 173.4 pounds that it was in 2010. Eyes: There is no arcus or proptosis.  Mouth: The oropharynx appears normal. The tongue appears normal. There is normal oral moisture. There is  no obvious gingivitis. Neck: There are no bruits present. The thyroid gland appears normal in size. The thyroid gland is again within normal limits at 20 grams in size. The consistency of the thyroid gland is normal. There is no thyroid tenderness to palpation. Lungs: The lungs are clear. Air movement is good. Heart: The heart rhythm and rate appear normal. Heart sounds S1 and S2 are normal. I do not appreciate any pathologic heart murmurs. Abdomen: The abdomen is more enlarged. Bowel sounds are normal. The abdomen is soft and non-tender. There is no obviously palpable hepatomegaly, splenomegaly, or other masses.  Arms: Muscle mass appears appropriate for age.  Hands: She has a 1+ tremor. Phalangeal and metacarpophalangeal joints appear normal. Palms are normal. Legs: Muscle mass appears appropriate for age. There is a trace of edema.  Feet: There are no significant deformities. Dorsalis pedis pulses are normal 2+ bilaterally. She has no visible tinea in her great toenails.  Neurologic: Muscle strength is  normal for age and gender  in both the upper and the lower extremities. Muscle tone appears normal. Sensation to touch is normal in the legs and feet.    LAB DATA:  Labs 03/24/18: HbA1c 7.7%, CBG 103  Labs 01/03/18: CBG 112 fasting  Lbs 12/02/17: sodium 134, glucose 322, creatinine 1.17  labs 11/16/17: Urine microalbumin/creatinine ratio 4.8; B12 282 (ref [phone]), folate 12.7 (ref >3.0)  Labs 11/01/17: HbA1c 6.9%, CBG 189 post-prandially; PTH 108, calcium 8.6, 25-OH vitamin D 64   Labs 09/13/17: Hepatic function panel normal; CBC normal  Labs 08/09/17: Hepatic function panel normal; CBC normal, except Hct 34.7 (35-45)  Labs 06/14/17: CBG 166  Labs 06/08/17: TSH 1.80, free T4 1.3, free T3 2.3; CMP normal except for glucose 115 and creatinine 1.27; PTH 120, calcium 9.7, 25-OH vitamin D 34;cholesterol 188, triglycerides 110, HDL 67, LDL 100; urine microalbumin/creatinine ratio   Labs 04/12/17: HbA1c 10.0%, CBG 174  Labs 03/04/17: Sodium 131, potassium 4.0, chloride 99, CO2 20, glucose 515, calcium 8.9  Labs 01/11/17: CBG 198  Labs 11/24/16: CBG 468; urine glucose 2000+, negative ketones  Labs 11/17/16: HbA1c 13.4%; TSH 1.54; cholesterol 173, triglycerides 91, HDL 71, LDL 83; 25-OH vitamin D 21; CMP normal except for glucose 314; CBC normal except MCHC 26.5 (ref 27-33)   06/04/16: HBA1c 8.1%, CBG 115  05/27/16: TSH 2.62, free T4 1.1, free T3 2.3; CMP normal except for glucose 161, and creatinine 1.09; cholesterol 187, triglycerides 77, HDL 74, LDL 98; urinary microalbumin/creatinine ratio 6; PTH 112, calcium 9.2, 25-OH vitamin D 33  03/17/16: CBG 145  Labs 12/31/15: HbA1c 13.4%, CBG 390; CMP normal except for glucose 344 and creatinine 1.14  Labs 10/08/15: HbA1c 9.6%  Labs 07/02/15: HbA1c 8.9%; PTH 107, calcium 9.2, 25-OH vitamin D 33; CBC normal; CMP normal except for creatinine 1.07; cholesterol 174, triglycerides 91, HDL 74, LDL 82; TSH 2.43, free T4 1.2, free T3 2.3  Labs 02/19/15:  PTH 99, calcium 8.7, 25-OH vitamin D 20  Labs 10/08/14: HbA1c 8.0%   Labs 08/06/14: HbA1c 7.4%  Labs 08/01/14: TSH 2.014, free T4 0.87, free T3 2.4; PTH 79 (normal 14-64), calcium 9.1; 25-OH vitamin D 35 CMP normal  Labs 05/11/14: HbA1c was 7.7%, without any low BGs; TSH 2.416, free T4 1.13, free T3 2.3  Labs 02/22/14: HbA1c 11.6%  Labs 02/13/14: Calcium 9.0, PTH 131, 25-OH vitamin D 15; CMP normal except for glucose of 165; cholesterol 177, triglycerides 108, HDL 90, LDL 65; urinary microalbumin/creatinine ratio 15.6; TSH 3.639, free  T4 1.30, free T3 2.5  Labs 10/10/13: HbA1c is 9.4%, compared with 8.1% at last visit and with 9.3% in December;   Labs 05/08/13: HbA1c 8.1%; PTH 90.2, calcium 9.5, 25-hydroxy vitamin D 46; CMP normal except for glucose of 215; WBC 8.0, RBC 5.20, Hgb 12.5, Hct 37.8%, MCV 72.7 (normal 78-100), iron 55  Labs 02/23/13: CMP normal, except glucose 194 and alkaline phosphatase 126; microalbumin/creatinine ratio was 11.5; TSH 2.086, free T4 1.29, free T3 3.2, TPO antibody < 10; 25-hydroxy vitamin D 18; C-peptide 1.79; cholesterol 156, triglycerides 64, HDL 61, LDL 82  Labs 01/05/13: Hemoglobin A1c was 9.3%, compared with 9.3 at last visit and with 9.1% at the visit prior. All of these values were major increases from 6.6% in May 2012.    Labs 04/14/12: 25-hydroxy vitamin D 21, iron 203,  Vitamin B12 379,   Labs 11/16/11: Hgb 10.3, Hct 32.4%; CMP normal except glucose 146' cholesterol 201, triglycerides 70, HDL 66, LDL 121; vitamin D 22           Labs 06/09/10: Cholesterol was 175, triglycerides 76, HDL 62, LDL 98. TSH was 1.217. Free T4 was 1.10. Free T3 was 2.7. 25-hydroxy vitamin D was 19. PTH was 167.   Assessment and Plan:   ASSESSMENT:  1. Type 2 diabetes mellitus:   A. Her T2DM was out of control at her December 2017 visit due to her not taking medications reliably and to not exercising. Her HbA1c of 13.4% was probably the highest that it had ever been up to that  time..    B. At her visit in May 2018 her HbA1c had decreased to 8.0%. Her BG control improved dramatically when she took her medications and watched her carb intake. Contrave was helping.   C. Unfortunately, she later relapsed in terms of not taking her medications and other maladaptive health behaviors. Her HbA1c increased back to 13.4% as of 11/17/16.   D. After becoming very frightened by her chest pain and admission in February 2019, she has resumed taking her medications and is doing much better in terms of her DM. Her HbA1c of 6.9% in October 2019 was within the  ADA goal range of <7.0%. After resuming her DM and psych medications and losing weight, her BGs and her mood had improved.   E. At this visit her HbA1c has dramatically increased. Part of this increase is due to the large amount of Coke she has been drinking, but she has also been taking in more food carbs.  2. Hypoglycemia: She has not had any hypoglycemia symptoms recently, presumable due taking in more glucose. 3. Obesity: Her weight has increased 2 more pounds since her last visit. She is taking in more carbs, but has not been taking Contrave. She needs to continue to take her medications, continue to Eat Right and exercise daily.    4-5. Autonomic neuropathy and tachycardia: The patient's autonomic neuropathy and  heart rate improved as her BGs decreased.    6. Hypertension: Her BP is good today. She still needs to try to fit in more exercise.  7-8. Anhedonia and anxiety: She is doing much better now, partly due to taking her meds and partly due to working out some of the family problems. She needs to call for help when she begins to do poorly. She also needs to continue to find ways to have more joy in her life. 9-12. GERD/esophagitis/difficulty swallowing/gastroparesis: Her reflux, other GI symptoms, and difficulty swallowing have all improved.  13-15: Hypocalcemia, vitamin D deficiency, and secondary hyperparathyroidism:   A. Due  to her bariatric surgery she has more difficulty absorbing vitamin D and calcium. When she develops hypocalcemia and vitamin D deficiency, her parathyroid glands appropriated produce more PTH, resulting in secondary hyperparathyroidism. However, if she takes her calcium and vitamin D all three parameters will normalize.   B. Her calcium was normal in October 2018, but at the lower limit of normal in February 2019. Her vitamin D level was low-normal in May 2019.   C. In May 2019 her PTH was elevated to 120 and her calcium was 9.7. She still had secondary hyperparathyroidism. In October 2019 her calcium was at the low end of the reference range and her PTH was still elevated, but lower. She needs to take her vitamin D and calcium very consistently.  35. Noncompliance: Since last visit her compliance with eating right and taking her medications has worsened.   17: Muscle cramps: Her cramps occur more frequently now. Her potassium and calcium were normal in May 2019.  18. Hypothyroidism: Her TFTs in May 2019 were normal. We need to repeat her TFTs now.  19. Sleeping difficulties: I suspect that her increased caffeine intake is causing all of these problems.   PLAN:  1. Diagnostic: TFTs, calcium, PTH, 25-OH vitamin D today   2. Therapeutic: Continue medications as currently prescribed, to include metformin does of 500 mg in the mornings and 1000 mg in the evenings. Continue Synthroid at bedtime. Take calcium at mealtimes. Take Biotech, 50,000 IU per week to improve her vitamin D level. Try to fit in exercise daily.  See Ms. Showfety in follow up.  Convert Victoza to Trulicity.  3. Patient education: We discussed all of the above at great length. She must continue to take her medications and supplements. She also needs to see me every two months. When she knows that she is coming to see "[facility]", her compliance improves.  4. Follow-up: 2 months   Level of Service: This visit lasted in excess of 60  minutes. More than 50% of the visit was devoted to counseling.   Tillman Sers, MD, CDE Adult and Pediatric Endocrinology 03/24/2018 10:45 AM

## 2018-03-25 NOTE — Progress Notes (Signed)
Pt presents with mycotic infection of nails 1-5 bilateral  All other systems are negative  Laser therapy administered to affected nails and tolerated well. All safety precautions were in place. 4th treatment.  Follow up in 4 weeks     

## 2018-04-06 MED FILL — metFORMIN HCL 1000 MG TABS: 1000 | 90 days supply | Qty: 180 | Fill #1

## 2018-04-06 MED FILL — TELMISARTAN 40 MG TABLET: 40 | 90 days supply | Qty: 90 | Fill #1

## 2018-04-06 MED FILL — LEVOTHYROXINE 25 MCG TABLET: 25 | 90 days supply | Qty: 90 | Fill #1

## 2018-04-06 MED FILL — CARVEDILOL 3.125 MG TABLET: 3.125 | 30 days supply | Qty: 60 | Fill #7

## 2018-04-06 MED FILL — ESCITALOPRAM 20 MG TABLET: 20 | 90 days supply | Qty: 90 | Fill #1

## 2018-04-06 MED FILL — PANTOPRAZOLE SOD DR 40 MG T: 40 | 90 days supply | Qty: 90 | Fill #3

## 2018-04-11 ENCOUNTER — Encounter (INDEPENDENT_AMBULATORY_CARE_PROVIDER_SITE_OTHER): Payer: Self-pay

## 2018-04-14 ENCOUNTER — Ambulatory Visit (INDEPENDENT_AMBULATORY_CARE_PROVIDER_SITE_OTHER): Payer: No Typology Code available for payment source | Admitting: Physician Assistant

## 2018-04-14 ENCOUNTER — Telehealth: Payer: Self-pay

## 2018-04-14 NOTE — Telephone Encounter (Signed)
Left message for patient to call back regarding appointment with Dr. Irish Lack on 3/26. Call placed due to restrictions enacted for Covid 19.

## 2018-04-20 NOTE — Telephone Encounter (Signed)
Patient called and cancelled appt with operator and will call back to reschedule (see cancelled appt note).

## 2018-04-21 ENCOUNTER — Ambulatory Visit: Payer: 59 | Admitting: Interventional Cardiology

## 2018-04-21 ENCOUNTER — Ambulatory Visit: Payer: No Typology Code available for payment source | Admitting: Internal Medicine

## 2018-04-21 ENCOUNTER — Other Ambulatory Visit: Payer: No Typology Code available for payment source

## 2018-04-21 ENCOUNTER — Encounter (INDEPENDENT_AMBULATORY_CARE_PROVIDER_SITE_OTHER): Payer: Self-pay

## 2018-04-29 ENCOUNTER — Other Ambulatory Visit: Payer: Self-pay

## 2018-04-29 ENCOUNTER — Ambulatory Visit (INDEPENDENT_AMBULATORY_CARE_PROVIDER_SITE_OTHER): Payer: No Typology Code available for payment source | Admitting: Internal Medicine

## 2018-04-29 ENCOUNTER — Telehealth: Payer: Self-pay | Admitting: Internal Medicine

## 2018-04-29 ENCOUNTER — Encounter: Payer: Self-pay | Admitting: Internal Medicine

## 2018-04-29 DIAGNOSIS — E1140 Type 2 diabetes mellitus with diabetic neuropathy, unspecified: Secondary | ICD-10-CM

## 2018-04-29 DIAGNOSIS — Z6830 Body mass index (BMI) 30.0-30.9, adult: Secondary | ICD-10-CM

## 2018-04-29 DIAGNOSIS — M545 Low back pain, unspecified: Secondary | ICD-10-CM

## 2018-04-29 DIAGNOSIS — M5432 Sciatica, left side: Secondary | ICD-10-CM

## 2018-04-29 DIAGNOSIS — F411 Generalized anxiety disorder: Secondary | ICD-10-CM

## 2018-04-29 DIAGNOSIS — E669 Obesity, unspecified: Secondary | ICD-10-CM

## 2018-04-29 MED ORDER — ALPRAZOLAM 0.5 MG PO TABS: ORAL_TABLET | ORAL | 1 refills | Status: DC

## 2018-04-29 MED FILL — ALPRAZolam 0.5 MG TABS: 0.5 | 30 days supply | Qty: 60 | Fill #0

## 2018-04-29 NOTE — Progress Notes (Signed)
   Subjective:    Patient ID: Alexa [name], female    DOB: 10/21/1960, 58 y.o.   MRN: [id number]  HPI 58 year old Female with history of anxiety and depression works as a Writer at [employer].  She has a history of chronic back pain.  This seems to be improved.  She has had repeated issues with anxiety and insomnia during the COVID-19 outbreak.  She is worried about her health and exposure to possible COVID-19 patients.  She is concerned about her work status.  Human resources has provided a form which can be completed and sent in for her.  She has a history of diabetes mellitus which has been poorly controlled at times due to longstanding issues with her taking responsibility for her health.  She is in counseling for this.  She is followed by Dr. Tobe Sos.  She is status post bilateral breast reduction surgery in January 2020 and is pleased with that result and feels that she has healed well from that.  With regard to back pain currently is not an issue and she is doing fairly well with that.  She would like to have some medication for anxiety and we have called in Xanax for her.  Interactive audio and video telecommunications were achieved between this provider and patient.  She is identified as Alexa [name], a patient in this practice using 2 identifiers.       Review of Systems see above-back pain is stable.  Main issue right now is anxiety state due to COVID-19 outbreak and possible exposure to COVID-19 patient     Objective:   Physical Exam Spent 20 minutes speaking with her about her concerns and issues.  Her back issue is stable at this point in time.  She has been to counseling regarding failure to care for her health.  Encouraged her to consider counseling for anxiety state during the COVID-19 outbreak.       Assessment & Plan:  History of recurrent back pain  Anxiety state  Plan: Prescribed Xanax 0.5 mg 1/2 to 1 tablet twice daily as needed for  anxiety.  Continue counseling for long-term counselor to address anxiety and issues being motivated to take care of her health.  20 minutes spent with patient  A form will be completed regarding my recommendations regarding avoiding direct contact with COVID-19 patients due to her health status

## 2018-04-29 NOTE — Telephone Encounter (Signed)
Attempted to connect with prearranged WebEx visit at 10:45 am today. Could not connect. We called patient's home and was told she was not there. Can be rescheduled.

## 2018-05-21 NOTE — Patient Instructions (Signed)
Form will be completed regarding avoiding direct contact with COVID-19 patient's due to her health status.  Take Xanax as needed for anxiety.  Please take good care of your diabetes during this outbreak.

## 2018-06-02 ENCOUNTER — Other Ambulatory Visit: Payer: No Typology Code available for payment source

## 2018-06-09 ENCOUNTER — Encounter (INDEPENDENT_AMBULATORY_CARE_PROVIDER_SITE_OTHER): Payer: Self-pay | Admitting: "Endocrinology

## 2018-06-15 MED FILL — ALPRAZolam 0.5 MG TABS: 0.5 | 30 days supply | Qty: 60 | Fill #1

## 2018-06-16 ENCOUNTER — Ambulatory Visit (INDEPENDENT_AMBULATORY_CARE_PROVIDER_SITE_OTHER): Payer: No Typology Code available for payment source | Admitting: "Endocrinology

## 2018-07-15 ENCOUNTER — Ambulatory Visit (INDEPENDENT_AMBULATORY_CARE_PROVIDER_SITE_OTHER): Payer: No Typology Code available for payment source | Admitting: "Endocrinology

## 2018-07-15 ENCOUNTER — Other Ambulatory Visit: Payer: Self-pay

## 2018-07-15 ENCOUNTER — Ambulatory Visit (INDEPENDENT_AMBULATORY_CARE_PROVIDER_SITE_OTHER): Payer: No Typology Code available for payment source

## 2018-07-15 DIAGNOSIS — B351 Tinea unguium: Secondary | ICD-10-CM

## 2018-07-19 NOTE — Progress Notes (Signed)
Pt presents with mycotic infection of nails 1-5 bilateral  All other systems are negative  Laser therapy administered to affected nails and tolerated well. All safety precautions were in place 5th treatment.  Follow up in 4 weeks

## 2018-08-12 ENCOUNTER — Other Ambulatory Visit: Payer: Self-pay

## 2018-08-12 ENCOUNTER — Ambulatory Visit: Payer: No Typology Code available for payment source | Admitting: Podiatry

## 2018-08-12 ENCOUNTER — Ambulatory Visit (INDEPENDENT_AMBULATORY_CARE_PROVIDER_SITE_OTHER): Payer: No Typology Code available for payment source | Admitting: "Endocrinology

## 2018-08-12 DIAGNOSIS — B351 Tinea unguium: Secondary | ICD-10-CM

## 2018-08-23 ENCOUNTER — Telehealth: Payer: Self-pay | Admitting: Internal Medicine

## 2018-08-23 NOTE — Telephone Encounter (Signed)
OK 

## 2018-08-23 NOTE — Telephone Encounter (Signed)
Does she have antibiotic eye drops Ocuflox to use?

## 2018-08-23 NOTE — Telephone Encounter (Signed)
Alexa [name] [phone]  Alexa [name] called to say that her eye had been bothering her for a couple of days, this morning she woke up with it swollen, red and yellow drainage. It is very sore, no sign of a stye.

## 2018-08-23 NOTE — Telephone Encounter (Signed)
She does not, this has been going on for 3 days now. Eye is swollen, patient will call eye doctor to see what he suggests.   Uses pharmacy [facility] on [address], [place].

## 2018-08-31 ENCOUNTER — Other Ambulatory Visit (INDEPENDENT_AMBULATORY_CARE_PROVIDER_SITE_OTHER): Payer: Self-pay | Admitting: *Deleted

## 2018-08-31 ENCOUNTER — Encounter (INDEPENDENT_AMBULATORY_CARE_PROVIDER_SITE_OTHER): Payer: Self-pay

## 2018-08-31 DIAGNOSIS — I10 Essential (primary) hypertension: Secondary | ICD-10-CM

## 2018-08-31 DIAGNOSIS — K219 Gastro-esophageal reflux disease without esophagitis: Secondary | ICD-10-CM

## 2018-08-31 DIAGNOSIS — E1165 Type 2 diabetes mellitus with hyperglycemia: Secondary | ICD-10-CM

## 2018-08-31 DIAGNOSIS — E063 Autoimmune thyroiditis: Secondary | ICD-10-CM

## 2018-08-31 DIAGNOSIS — E1110 Type 2 diabetes mellitus with ketoacidosis without coma: Secondary | ICD-10-CM

## 2018-08-31 DIAGNOSIS — IMO0002 Reserved for concepts with insufficient information to code with codable children: Secondary | ICD-10-CM

## 2018-08-31 MED ORDER — PANTOPRAZOLE SODIUM 40 MG PO TBEC: 40.0000 mg | DELAYED_RELEASE_TABLET | Freq: Every day | ORAL | 4 refills | Status: DC

## 2018-08-31 MED ORDER — TELMISARTAN 40 MG PO TABS: 20.0000 mg | ORAL_TABLET | Freq: Two times a day (BID) | ORAL | 3 refills | Status: DC

## 2018-08-31 MED ORDER — METFORMIN HCL 1000 MG PO TABS: ORAL_TABLET | ORAL | 1 refills | Status: DC

## 2018-08-31 MED ORDER — COREG 3.125 MG PO TABS: 3.1250 mg | ORAL_TABLET | Freq: Two times a day (BID) | ORAL | 11 refills | Status: DC

## 2018-08-31 MED ORDER — LEVOTHYROXINE SODIUM 25 MCG PO TABS: ORAL_TABLET | ORAL | 1 refills | Status: DC

## 2018-08-31 MED ORDER — VICTOZA 18 MG/3ML ~~LOC~~ SOPN: PEN_INJECTOR | SUBCUTANEOUS | 1 refills | Status: DC

## 2018-08-31 MED ORDER — ESCITALOPRAM OXALATE 20 MG PO TABS: 20.0000 mg | ORAL_TABLET | Freq: Every day | ORAL | 1 refills | Status: DC

## 2018-08-31 MED FILL — metFORMIN HCL 1000 MG TABS: 1000 | 90 days supply | Qty: 180 | Fill #0

## 2018-08-31 MED FILL — CARVEDILOL 3.125 MG TABLET: 3.125 | 30 days supply | Qty: 60 | Fill #0

## 2018-08-31 MED FILL — PANTOPRAZOLE SOD DR 40 MG T: 40 | 90 days supply | Qty: 90 | Fill #0

## 2018-08-31 MED FILL — ESCITALOPRAM 20 MG TABLET: 20 | 90 days supply | Qty: 90 | Fill #0

## 2018-08-31 MED FILL — VICTOZA 18 MG/3 ML INJECT P: 18 | 90 days supply | Qty: 27 | Fill #0

## 2018-08-31 MED FILL — TELMISARTAN 40 MG TABLET: 40 | 30 days supply | Qty: 30 | Fill #0

## 2018-08-31 MED FILL — LEVOTHYROXINE 25 MCG TABLET: 25 | 90 days supply | Qty: 90 | Fill #0

## 2018-09-05 ENCOUNTER — Telehealth: Payer: Self-pay

## 2018-09-05 NOTE — Telephone Encounter (Signed)
Patient called is needing FMLA paperwork fill out again for her back pain, she said she can come in if she needs to she will bring paperwork to her appointment. Please advise.

## 2018-09-05 NOTE — Telephone Encounter (Signed)
Yes she needs appt for this and there is a charge to fill out the paperwork. It is not urgent

## 2018-09-06 NOTE — Telephone Encounter (Signed)
Schedule appointment?

## 2018-09-07 ENCOUNTER — Ambulatory Visit (INDEPENDENT_AMBULATORY_CARE_PROVIDER_SITE_OTHER): Payer: No Typology Code available for payment source | Admitting: "Endocrinology

## 2018-09-08 ENCOUNTER — Other Ambulatory Visit: Payer: Self-pay

## 2018-09-08 ENCOUNTER — Encounter: Payer: Self-pay | Admitting: Internal Medicine

## 2018-09-08 ENCOUNTER — Ambulatory Visit (INDEPENDENT_AMBULATORY_CARE_PROVIDER_SITE_OTHER): Payer: No Typology Code available for payment source | Admitting: Internal Medicine

## 2018-09-08 ENCOUNTER — Ambulatory Visit (INDEPENDENT_AMBULATORY_CARE_PROVIDER_SITE_OTHER): Payer: No Typology Code available for payment source | Admitting: "Endocrinology

## 2018-09-08 ENCOUNTER — Encounter (INDEPENDENT_AMBULATORY_CARE_PROVIDER_SITE_OTHER): Payer: Self-pay | Admitting: "Endocrinology

## 2018-09-08 VITALS — BP 120/90 | HR 86 | Ht 63.0 in | Wt 177.0 lb

## 2018-09-08 VITALS — BP 118/70 | HR 108 | Wt 177.2 lb

## 2018-09-08 DIAGNOSIS — E11649 Type 2 diabetes mellitus with hypoglycemia without coma: Secondary | ICD-10-CM

## 2018-09-08 DIAGNOSIS — M545 Low back pain, unspecified: Secondary | ICD-10-CM

## 2018-09-08 DIAGNOSIS — E063 Autoimmune thyroiditis: Secondary | ICD-10-CM

## 2018-09-08 DIAGNOSIS — E1140 Type 2 diabetes mellitus with diabetic neuropathy, unspecified: Secondary | ICD-10-CM | POA: Diagnosis not present

## 2018-09-08 DIAGNOSIS — E559 Vitamin D deficiency, unspecified: Secondary | ICD-10-CM

## 2018-09-08 DIAGNOSIS — E211 Secondary hyperparathyroidism, not elsewhere classified: Secondary | ICD-10-CM

## 2018-09-08 DIAGNOSIS — E1141 Type 2 diabetes mellitus with diabetic mononeuropathy: Secondary | ICD-10-CM

## 2018-09-08 DIAGNOSIS — E049 Nontoxic goiter, unspecified: Secondary | ICD-10-CM

## 2018-09-08 DIAGNOSIS — R4584 Anhedonia: Secondary | ICD-10-CM

## 2018-09-08 DIAGNOSIS — I10 Essential (primary) hypertension: Secondary | ICD-10-CM

## 2018-09-08 DIAGNOSIS — G479 Sleep disorder, unspecified: Secondary | ICD-10-CM

## 2018-09-08 LAB — POCT GLYCOSYLATED HEMOGLOBIN (HGB A1C): Hemoglobin A1C: 8.2 % — AB (ref 4.0–5.6)

## 2018-09-08 LAB — POCT GLUCOSE (DEVICE FOR HOME USE): POC Glucose: 123 mg/dl — AB (ref 70–99)

## 2018-09-08 NOTE — Progress Notes (Signed)
Subjective:  Patient Name: Alexa [name] Date of Birth: [birth year]  MRN: [id number]  Alexa [name]  presents to the office today for follow-up of her type 2 diabetes mellitus, obesity, combined hyperlipidemia, GERD, hypertension, ASHD, dyspepsia, pedal edema, non-proliferative diabetic retinopathy, goiter, depression, autonomic neuropathy, tachycardia, peripheral neuropathy, acquired hypothyroidism, vitamin D deficiency, hypocalcemia, secondary hyperparathyroidism, glossitis, pallor, fatigue, iron deficiency anemia, anhedonia, disinclination to take medicines, and status post Roux-en-Y gastric bypass.   HISTORY OF PRESENT ILLNESS:   Alexa [name] is a 58 y.o. Caucasian woman. Alexa [name] was unaccompanied.  1. The patient first presented to me on 08/20/04 in referral from her primary care internist, Dr. Emeline General, for evaluation and management of her type 2 diabetes, obesity, and multiple medical issues. She was 58 years old.   [name] had a long history of obesity. She was heavy as a child. She underwent menarche at age 42. At age 89 she was 180 pounds. In 1996 she weighed 218 pounds. In 1999 she was diagnosed with type 2 diabetes mellitus. Her weight at that point was 250 pounds. She was treated with Glucophage, and Actos. Actos made her gain even more weight. She had also been treated with glipizide in the past. More recently she had been treated with Lantus and Glucophage plus regular insulin as needed, especially for when she took steroids for asthma attacks. Maximum weight had been 296 pounds one month prior to that first visit with me. Her tendency to gain weight and her difficulty in losing weight were aggravated by long-standing, intermittent depression and by severe, recurrent asthma requiring the use of steroid medications.  B. Past medical history was also positive for a 60% blockage in one of her coronary arteries. She had significant issues with GERD and dyspepsia. She also had combined  hyperlipidemia. She had a previous cardiac catheterization and previous tonsillectomy. She was allergic to theophylline. Her pertinent review of systems was positive for some numbness and tingling in her feet. Family history was positive for type 2 diabetes in her father and her maternal grandmother. Her brother weighed 250 pounds at a height of 76 inches. Both her mother and her sister were hypothyroid.  C. On physical examination, her weight was 279.9 pounds. Her BMI was 49.6. Her blood pressure was 142/86. Her heart rate was 96. Her hemoglobin A1c was 9.8%. She was alert and oriented x3. Her affect was normal. Her insight was fair. She was obviously quite obese. She had a 25 gm thyroid gland. She had 1+ tremor of her hands. She had 1+ DP pulses and 2+ tinea pedis. Sensation to touch was intact in her feet. Laboratory data included a normal CMP. Her cholesterol was 175, triglycerides 76, HDL 53, and LDL 107. Her TSH was 2.98. Since she obviously did have type 2 diabetes mellitus associated with morbid obesity, and since weight loss was a major factor for her, I asked her to resume her metformin twice daily. I also started her on Byetta, initially 5 mcg twice daily and later 10 mcg twice daily. I discontinued her Lantus insulin.  2. During the last 14 years,we have had some successes, some failures, and some new problem areas.  A. T2DM: In October 2007 the combination of Byetta and metformin was causing more gastrointestinal problems. She opted to stop the metformin and continue the Byetta because it was helping her with weight control and blood sugar control. By 08/11/06 her weight had decreased to 267.6 pounds. Hemoglobin A1c was 8.6%. At that point  she decided to have bariatric surgery. She had a Roux-en-Y gastric bypass on 12/20/2006. She subsequently lost weight down to 173.4 pounds on 05/23/08, but then subsequently regained weight to the 190s. Her  hemoglobin A1c values varied in parallel with her weights.  On 05/23/08, at the point of her lowest weight, her hemoglobin A1c dropped to a nadir of 6.3%.  Since then her hemoglobin A1c values have varied between 6.6 and 13.4%. Although we were initially able to stop all of her diabetes medicines after bariatric surgery, when she began to regain weight we restarted metformin, 500 mg twice daily. Although she took her metformin twice daily for a long time, she had discontinued it many years ago. She hated to take medicines and could not make herself be compliant in taking medications or exercising. When her HbA1c increased to 9.4% in September 2015, I stopped her Byetta injections and started her on liraglutide (Victoza) daily injections on 11/16/13. After several more years of noncompliance, she has been doing much better in the past year.   B. Vitamin D deficiency, hypocalcemia, and secondary hyperparathyroidism: Just before her gastric bypass, I obtained baseline bone mineral metabolism studies. Her 25-hydroxy vitamin D was very low at 7 (normal greater than or equal to 30). Her calcium was 9.5 (normal 8.6-10.6). Her parathyroid hormone was 38.4 (normal 14-72). Subsequent to her surgery, the patient was supposed to be taking multivitamins with calcium and vitamin D, but did not always do so. On 03/29/2007 her 25-hydroxy vitamin D was 29, but her calcium decreased to 8.7. Her PTH was slightly elevated at 73.5. Her 1,25-hydroxy vitamin D was 46. Her iron was 56. I asked her to make sure she took her multivitamins and calcium daily. Unfortunately, she has often been non-compliant with her multivitamins and calcium. Her 25-hydroxy vitamin D values have varied between 15-35. Her calcium values have varied between 8.1-9.4. Her PTH values have remained elevated between 79-167. Alexa [name]'s iron levels have also been low, between 32-34. She was supposed to be taking iron every day, but frequently did not do so. In the past year, however, she has been doing better at taking her vitamin  D, calcium, and iron. In May 2019 her vitamin D was at the lower end of the reference range, her calcium was about the 40% of the reference range, but her PTH was still significantly elevated.    Alexa [name] has had psychological issues for many years that have adversely affected her eating, her unwillingness to exercise, her noncompliance with taking medications, her weight, and her T2DM care. Fortunately, her recent outpatient psychological therapy with Ms. Rea College, RN, MS and her taking Lexapro regularly have resulted in Alexa [name] having a much more positive attitude about life in general and a generally more positive attitude about taking her medications. Fortunately, Alexa [name]'s has been dealing successfully with her issues and her compliance has markedly improved.   D. Obesity: Although Corda regained a great deal of weight in the years following her bariatric surgery, in the past year she has been taking Contrave and has been able to lose almost all of the weight that she had gained after her bariatric surgery.   3. Ane's last [facility] visit was on 03/24/18. At that visit I asked her to continue her morning dose of metformin of 500 mg and continue the evening dose of 1000 mg. I continued her Synthroid dose at bedtime. I asked her to take one [employer], 50,000 IU capsule of vitamin D weekly and calcium at  each meal. I changed her to Trulicity due to insurance issues, but her insurance changed its policy again , so she is now back on Victoza.   A. She has been healthy. "I've been back on the wagon for the past month. I'm taking all of my medicines, to include Contrave." She also sees Ms. Showfety more frequently. She feels better physically and emotionally.    B. She has fully recovered from her breast reduction surgery on 12/13/17.   C. She has been sleeping better if she takes Xanax intermittently. She is drinking very little regular Coke.   D. She resumed taking Contrave, 1 pill, twice daily and will  slowly increase the dose to two pills twice daily in the near future.    Shawn Route remains in counseling with Ms. Showfety every 2-4 weeks.    F . She has not had much of a problem with asthma this year.    G. She still has some hot flashes, but "much better".  H. She has not been bothered by leg cramps in at least a month. The pains in her left shin still occur at times.    I. She has not been walking much.   J. She is taking Lexapro, 20 mg/day; Synthroid, 25 mcg/day; Micardis, 20 mg, twice daily; Victoza,1.8 mg/day; Protonix, 40 mg/day; metformin 500 mg on the morning and 1000 mg in the evening. She takes one 800+ mg Citracal tablet twice daily. She is also taking Coreg, 3.125 mg, twice daily. She is also taking B12 daily. She also takes one Contrave twice daily for now.    K. She is doing pretty well at not allowing herself to become over-committed. She is trying to achieve more balance.   L. She has not had any low BG symptom recently  M. She is having laser treatments now for her toe fungus. She stopped the terbinafine.   4. Review of Systems: There are no other significant issues  Constitutional: Alexa [name] feels "much better than I did a month ago". Life is going pretty good, but she still has some anxiety.  Eyes: Vision has been okay. Her last eye exam occurred during the Summer of 2019. No diabetic eye disease was noted. She needs to schedule a follow up exam.  Neck: She occasionally has trouble swallowing, but not often. The patient has no complaints of anterior neck swelling, soreness, tenderness, pressure, or discomfort.  Heart: She saw her cardiologist in August 2019. Her follow up visit in March 2020 was cancelled due to covid-19. She needs to call to re-schedule. She occasionally had faster heart rates when she is anxious/stressed at work. She has not had much chest pain or pressure. Heart rate increases with exercise or other physical activity. She has no complaints of palpitations or  irregular heat beats Gastrointestinal: She has more nausea since resuming Contrave. She rarely  has the sensation of stomach pains. She still occasionally has post-prandial bloating if she eats too much. She does not have any excessive head hunger. She sometimes has belly hunger if she does not consume enough protein. She is not having acid reflux, upset stomach, stomach aches or pains, swallowing difficulties, diarrhea, or constipation.  Legs: As above. There are no complaints of numbness, tingling, or burning. No edema is noted. Feet: There are no complaints of numbness, tingling, burning, or pain. No edema is noted. Tinea improved with her new laser therapy.   Neuro: No new sensory or muscular problems Psych: She is doing very well.  GU: She has not been urinating a lot. She has much less nocturia.   Hypoglycemia: None recently.    5. BG printout: She is not checking BGs.  PAST MEDICAL, FAMILY, AND SOCIAL HISTORY:  Past Medical History:  Diagnosis Date  . Anemia, iron deficiency   . Anhedonia   . Anxiety   . Asthma   . Asthma, chronic   . Back pain   . CAD (coronary artery disease)   . Chewing difficulty   . Chronic insomnia   . Combined hyperlipidemia   . Constipation   . Depression   . Diabetes mellitus   . Diabetes mellitus type II   . Diabetic autonomic neuropathy ([place])   . Difficulty swallowing pills   . DM type 2 with diabetic peripheral neuropathy ([place])   . Dry mouth   . Dyspepsia   . Dyspnea   . Elevated homocysteine ([place])   . Essential tremor   . Fatigue   . Fatty liver   . GERD (gastroesophageal reflux disease)   . GERD (gastroesophageal reflux disease)   . Glossitis   . Goiter   . Hemorrhoids   . HLD (hyperlipidemia)   . Hyperparathyroidism , secondary, non-renal ([place])   . Hypertension   . Hypocalcemia   . Insomnia   . Leg edema   . Leg pain   . Macular degeneration   . Nonproliferative diabetic retinopathy associated with type 2 diabetes mellitus  ([place])    RESOLVED  . Obesity, Class III, BMI 40-49.9 (morbid obesity) ([place])   . Osteopenia   . Osteoporosis   . Pallor   . Status post gastric surgery   . Tachycardia   . Thyroiditis, autoimmune   . Vaginal dryness, menopausal   . Vitamin D deficiency     Family History  Problem Relation Age of Onset  . Thyroid disease Mother        Hypothyroid  . Depression Mother   . Liver disease Mother   . Obesity Mother   . Diabetes Father        T2 DM  . Hypertension Father   . Hyperlipidemia Father   . Heart disease Father   . Obesity Brother        250 pounds at a height of 76 inches.  . Thyroid disease Sister        Hypothyroid  . Diabetes Maternal Grandmother   . Colon cancer Other        great grandfather  . Liver disease Maternal Aunt        NASH  . Esophageal cancer Neg Hx   . Pancreatic cancer Neg Hx   . Kidney disease Neg Hx      Current Outpatient Medications:  .  ALPRAZolam (XANAX) 0.5 MG tablet, One half to one tab po twice daily prn anxiety, Disp: 60 tablet, Rfl: 1 .  Blood Glucose Monitoring Suppl (FREESTYLE LITE) DEVI, Use to check glucose 3x daily, Disp: 2 each, Rfl: 5 .  CALCIUM-VITAMIN D PO, Take 800 mg by mouth 2 (two) times daily with a meal. , Disp: , Rfl:  .  conjugated estrogens (PREMARIN) vaginal cream, Place 1 Applicatorful vaginally at bedtime. Use for 21 days then off for 7 days, Disp: , Rfl:  .  COREG 3.125 MG tablet, Take 1 tablet (3.125 mg total) by mouth 2 (two) times daily with a meal., Disp: 60 tablet, Rfl: 11 .  Cyanocobalamin (VITAMIN B 12) 100 MCG LOZG, Take by mouth., Disp: , Rfl:  .  cyclobenzaprine (FLEXERIL) 10 MG tablet, Take 1 tablet (10 mg total) by mouth 3 (three) times daily as needed for muscle spasms., Disp: 90 tablet, Rfl: 0 .  escitalopram (LEXAPRO) 20 MG tablet, Take 1 tablet (20 mg total) by mouth daily., Disp: 90 tablet, Rfl: 1 .  Ferrous Sulfate (SLOW FE PO), Take 1 tablet by mouth daily. , Disp: , Rfl:  .  ibuprofen  (ADVIL,MOTRIN) 200 MG tablet, Take 600 mg by mouth daily as needed for headache or moderate pain., Disp: , Rfl:  .  levothyroxine (SYNTHROID) 25 MCG tablet, TAKE 1 TABLET BY MOUTH ONCE DAILY, Disp: 90 tablet, Rfl: 1 .  metFORMIN (GLUCOPHAGE) 1000 MG tablet, TAKE 1 TABLET BY MOUTH 2 TIMES DAILY WITH A MEAL., Disp: 180 tablet, Rfl: 1 .  Multiple Vitamin (MULTIVITAMIN) tablet, Take 1 tablet by mouth daily. Reported on 02/26/2015, Disp: 30 tablet, Rfl: 0 .  Naltrexone-buPROPion HCl ER 8-90 MG TB12, Take by mouth., Disp: , Rfl:  .  pantoprazole (PROTONIX) 40 MG tablet, Take 1 tablet (40 mg total) by mouth daily., Disp: 90 tablet, Rfl: 4 .  telmisartan (MICARDIS) 40 MG tablet, Take 0.5 tablets (20 mg total) by mouth 2 (two) times daily., Disp: 90 tablet, Rfl: 3 .  traMADol (ULTRAM) 50 MG tablet, Take 1 tablet (50 mg total) by mouth daily as needed for moderate pain or severe pain., Disp: 30 tablet, Rfl: 1 .  VICTOZA 18 MG/3ML SOPN, INJECT 1.8 MG DAILY AS DIRECTED, Disp: 27 mL, Rfl: 1 .  Vitamin D, Ergocalciferol, (DRISDOL) 50000 units CAPS capsule, Take 50,000 Units by mouth every Sunday., Disp: , Rfl:  .  albuterol (PROVENTIL HFA;VENTOLIN HFA) 108 (90 Base) MCG/ACT inhaler, Inhale 2 puffs into the lungs every 6 (six) hours as needed for wheezing or shortness of breath. (Patient not taking: Reported on 03/24/2018), Disp: 1 Inhaler, Rfl: 11 .  albuterol (PROVENTIL) (2.5 MG/3ML) 0.083% nebulizer solution, Take 3 mLs (2.5 mg total) by nebulization every 6 (six) hours as needed for wheezing or shortness of breath. (Patient not taking: Reported on 09/08/2018), Disp: 150 mL, Rfl: 1 .  glucose blood (FREESTYLE LITE) test strip, Use to check glucose 3x daily (Patient not taking: Reported on 09/08/2018), Disp: 300 each, Rfl: 1 .  ondansetron (ZOFRAN) 4 MG tablet, Take 1 tablet (4 mg total) by mouth every 8 (eight) hours as needed for nausea or vomiting. (Patient not taking: Reported on 09/08/2018), Disp: 20 tablet, Rfl:  0  Allergies as of 09/08/2018 - Review Complete 09/08/2018  Allergen Reaction Noted  . Theophyllines Palpitations 05/13/2010    1. 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  2. Activities: She has been walking more.     3. Smoking, alcohol, or drugs: None 4. Primary Care Provider: Dr. Tommie Ard Baxley 5. Therapist: Ms. Rea College, MS 6. Bariatrician: Dr. Redgie Grayer, MD  REVIEW OF SYSTEM: There are no other significant problems involving [place]ia's other body systems.   Objective:  Vital Signs:  BP 118/70   Pulse (!) 108   Wt 177 lb 3.2 oz (80.4 kg)   LMP 05/11/2010   BMI 31.39 kg/m    Ht Readings from Last 3 Encounters:  03/24/18 5\' 3"  (1.6 m)  03/24/18 5' 3.78" (1.62 m)  02/17/18 5\' 3"  (1.6 m)   Wt Readings from Last 3 Encounters:  09/08/18 177 lb 3.2  oz (80.4 kg)  03/24/18 172 lb (78 kg)  03/24/18 176 lb 9.6 oz (80.1 kg)   PHYSICAL EXAM:  Constitutional:  Alexa [name] looks good today. She is alert and bright. Her affect and insight are very good. She is wearing a blouse and slacks today, but is not wearing make up or lipstick. She has gained 5 pounds since her last visit. Her weight is now just above the post-nadir of 173.4 pounds that it was in 2010. Eyes: There is no arcus or proptosis.  Mouth: The oropharynx appears normal. The tongue appears normal. There is normal oral moisture. There is no obvious gingivitis. Neck: There are no bruits present. The thyroid gland appears normal in size. The thyroid gland is again within normal limits at 20 grams in size. The consistency of the thyroid gland is normal. There is no thyroid tenderness to palpation. Lungs: The lungs are clear. Air movement is good. Heart: The heart rhythm and rate appear normal. Heart sounds S1 and S2 are normal. I do not appreciate any pathologic heart murmurs. Abdomen: The abdomen  is more enlarged. Bowel sounds are normal. The abdomen is soft and non-tender. There is no obviously palpable hepatomegaly, splenomegaly, or other masses.  Arms: Muscle mass appears appropriate for age.  Hands: She has a 1+ tremor. Phalangeal and metacarpophalangeal joints appear normal. Palms are normal. Legs: Muscle mass appears appropriate for age. There is no edema.  Feet: There are no significant deformities. Dorsalis pedis pulses are normal 1+ on the right and 2+ on the left. She has no visible tinea in her great toenails.  Neurologic: Muscle strength is normal for age and gender  in both the upper and the lower extremities. Muscle tone appears normal. Sensation to touch is normal in the legs and feet.    LAB DATA:  Labs 09/08/18: HbA1c 8.2%, CBG 123  Labs 03/24/18: HbA1c 7.7%, CBG 103  Labs 01/03/18: CBG 112 fasting  Lbs 12/02/17: sodium 134, glucose 322, creatinine 1.17  labs 11/16/17: Urine microalbumin/creatinine ratio 4.8; B12 282 (ref [id number]), folate 12.7 (ref >3.0)  Labs 11/01/17: HbA1c 6.9%, CBG 189 post-prandially; PTH 108, calcium 8.6, 25-OH vitamin D 64   Labs 09/13/17: Hepatic function panel normal; CBC normal  Labs 08/09/17: Hepatic function panel normal; CBC normal, except Hct 34.7 (35-45)  Labs 06/14/17: CBG 166  Labs 06/08/17: TSH 1.80, free T4 1.3, free T3 2.3; CMP normal except for glucose 115 and creatinine 1.27; PTH 120, calcium 9.7, 25-OH vitamin D 34;cholesterol 188, triglycerides 110, HDL 67, LDL 100; urine microalbumin/creatinine ratio   Labs 04/12/17: HbA1c 10.0%, CBG 174  Labs 03/04/17: Sodium 131, potassium 4.0, chloride 99, CO2 20, glucose 515, calcium 8.9  Labs 01/11/17: CBG 198  Labs 11/24/16: CBG 468; urine glucose 2000+, negative ketones  Labs 11/17/16: HbA1c 13.4%; TSH 1.54; cholesterol 173, triglycerides 91, HDL 71, LDL 83; 25-OH vitamin D 21; CMP normal except for glucose 314; CBC normal except MCHC 26.5 (ref 27-33)   06/04/16: HBA1c 8.1%, CBG  115  05/27/16: TSH 2.62, free T4 1.1, free T3 2.3; CMP normal except for glucose 161, and creatinine 1.09; cholesterol 187, triglycerides 77, HDL 74, LDL 98; urinary microalbumin/creatinine ratio 6; PTH 112, calcium 9.2, 25-OH vitamin D 33  03/17/16: CBG 145  Labs 12/31/15: HbA1c 13.4%, CBG 390; CMP normal except for glucose 344 and creatinine 1.14  Labs 10/08/15: HbA1c 9.6%  Labs 07/02/15: HbA1c 8.9%; PTH 107, calcium 9.2, 25-OH vitamin D 33; CBC normal; CMP normal except for creatinine 1.07;  cholesterol 174, triglycerides 91, HDL 74, LDL 82; TSH 2.43, free T4 1.2, free T3 2.3  Labs 02/19/15: PTH 99, calcium 8.7, 25-OH vitamin D 20  Labs 10/08/14: HbA1c 8.0%   Labs 08/06/14: HbA1c 7.4%  Labs 08/01/14: TSH 2.014, free T4 0.87, free T3 2.4; PTH 79 (normal 14-64), calcium 9.1; 25-OH vitamin D 35 CMP normal  Labs 05/11/14: HbA1c was 7.7%, without any low BGs; TSH 2.416, free T4 1.13, free T3 2.3  Labs 02/22/14: HbA1c 11.6%  Labs 02/13/14: Calcium 9.0, PTH 131, 25-OH vitamin D 15; CMP normal except for glucose of 165; cholesterol 177, triglycerides 108, HDL 90, LDL 65; urinary microalbumin/creatinine ratio 15.6; TSH 3.639, free T4 1.30, free T3 2.5  Labs 10/10/13: HbA1c is 9.4%, compared with 8.1% at last visit and with 9.3% in December;   Labs 05/08/13: HbA1c 8.1%; PTH 90.2, calcium 9.5, 25-hydroxy vitamin D 46; CMP normal except for glucose of 215; WBC 8.0, RBC 5.20, Hgb 12.5, Hct 37.8%, MCV 72.7 (normal 78-100), iron 55  Labs 02/23/13: CMP normal, except glucose 194 and alkaline phosphatase 126; microalbumin/creatinine ratio was 11.5; TSH 2.086, free T4 1.29, free T3 3.2, TPO antibody < 10; 25-hydroxy vitamin D 18; C-peptide 1.79; cholesterol 156, triglycerides 64, HDL 61, LDL 82  Labs 01/05/13: Hemoglobin A1c was 9.3%, compared with 9.3 at last visit and with 9.1% at the visit prior. All of these values were major increases from 6.6% in May 2012.    Labs 04/14/12: 25-hydroxy vitamin D 21, iron  203,  Vitamin B12 379,   Labs 11/16/11: Hgb 10.3, Hct 32.4%; CMP normal except glucose 146' cholesterol 201, triglycerides 70, HDL 66, LDL 121; vitamin D 22           Labs 06/09/10: Cholesterol was 175, triglycerides 76, HDL 62, LDL 98. TSH was 1.217. Free T4 was 1.10. Free T3 was 2.7. 25-hydroxy vitamin D was 19. PTH was 167.   Assessment and Plan:   ASSESSMENT:  1. Type 2 diabetes mellitus:   A. Her T2DM was out of control at her December 2017 visit due to her not taking medications reliably and to not exercising. Her HbA1c of 13.4% was probably the highest that it had ever been up to that time..    B. At her visit in May 2018 her HbA1c had decreased to 8.0%. Her BG control improved dramatically when she took her medications and watched her carb intake. Contrave was helping.   C. Unfortunately, she later relapsed in terms of not taking her medications and other maladaptive health behaviors. Her HbA1c increased back to 13.4% as of 11/17/16.   D. After becoming very frightened by her chest pain and admission in February 2019, she has resumed taking her medications and is doing much better in terms of her DM. Her HbA1c of 6.9% in October 2019 was within the  ADA goal range of <7.0%. After resuming her DM and psych medications and losing weight, her BGs and her mood had improved.   E. At her last visit her HbA1c had dramatically increased. Part of this increase was due to the large amount of Coke she has been drinking, but she had also been taking in more food carbs.   F. At today's visit her HbA1c has increased more, mostly due to being off her meds for several months. She is back on her meds now.  2. Hypoglycemia: She has not had any hypoglycemia symptoms recently, presumable due taking in more glucose. 3. Obesity: Her weight has increased  5 more pounds since her last visit. She is taking in more carbs, but had not been taking Contrave again until recently She needs to continue to take her  medications, continue to Eat Right and exercise daily.    4-5. Autonomic neuropathy and tachycardia: The patient's autonomic neuropathy and  heart rate improved as her BGs decreased, but worsened as her BGs increased. The Contrave is also probably causing her HR to be higher.     6. Hypertension: Her BP is good today. She still needs to try to fit in more exercise.  7-8. Anhedonia and anxiety: She is doing much better now, partly due to taking her meds and partly due to working out some of the family problems. She needs to call for help when she begins to do poorly. She also needs to continue to find ways to have more joy in her life. 9-12. GERD/esophagitis/difficulty swallowing/gastroparesis: Her reflux, other GI symptoms, and difficulty swallowing have all improved.   13-15: Hypocalcemia, vitamin D deficiency, and secondary hyperparathyroidism:   A. Due to her bariatric surgery she has more difficulty absorbing vitamin D and calcium. When she develops hypocalcemia and vitamin D deficiency, her parathyroid glands appropriated produce more PTH, resulting in secondary hyperparathyroidism. However, if she takes her calcium and vitamin D all three parameters will normalize.   B. Her calcium was normal in October 2018, but at the lower limit of normal in February 2019. Her vitamin D level was low-normal in May 2019.   C. In May 2019 her PTH was elevated to 120 and her calcium was 9.7. She still had secondary hyperparathyroidism. In October 2019 her calcium was at the low end of the reference range and her PTH was still elevated, but lower. She needs to take her vitamin D and calcium very consistently.  13. Noncompliance: Since last visit her compliance with eating right and taking her medications has markedly improved.    17: Muscle cramps: Her cramps occur only infrequently now.  18. Hypothyroidism: Her TFTs in May 2019 were normal. We need to repeat her TFTs in another month.  19. Sleeping difficulties:  These problems have improved. I suspect that her increased caffeine intake was causing all of these problems.   PLAN:  1. Diagnostic: Fasting TFTs, CMP, lipids, urine microalbumin, calcium, PTH, 25-OH vitamin D in mid September.  2. Therapeutic: Continue medications as currently prescribed, to include metformin does of 500 mg in the mornings and 1000 mg in the evenings. Continue Synthroid at bedtime. Take calcium at mealtimes. Take [employer], 50,000 IU per week to improve her vitamin D level. Try to fit in exercise daily.  See Ms. Showfety in follow up.    3. Patient education: We discussed all of the above at great length. She must continue to take her medications and supplements. She also needs to see me every two months. When she knows that she is coming to see "[facility] Re[facility]", her compliance improves.  4. Follow-up: 2 months   Level of Service: This visit lasted in excess of 65 minutes. More than 50% of the visit was devoted to counseling.   Tillman Sers, MD, CDE Adult and Pediatric Endocrinology 09/08/2018 12:05 PM

## 2018-09-08 NOTE — Patient Instructions (Signed)
Follow up visit in two months. Please repeat fasting lab tests in about mid-September.

## 2018-09-08 NOTE — Progress Notes (Signed)
Pt presents with mycotic infection of nails 1-5 bilateral  All other systems are negative  Laser therapy administered to affected nails and tolerated well. All safety precautions were in place 6th treatment.  Follow up in 4 weeks

## 2018-09-10 DIAGNOSIS — M545 Low back pain: Secondary | ICD-10-CM

## 2018-09-10 NOTE — Patient Instructions (Signed)
FMLA paperwork completed for recurrent left sciatica.  Patient interviewed and back issue currently is stable.

## 2018-09-10 NOTE — Progress Notes (Signed)
   Subjective:    Patient ID: Alexa [name], female    DOB: 1960-07-18, 58 y.o.   MRN: [id number]  HPI 58 year old Female in today to discuss renewal of FMLA for chronic recurrent back pain.  Was out of work February 14, 2018 with recurrent left-sided sciatica.  Did not require physical therapy.  Because she has diabetes mellitus, she is concerned about her potential to contract COVID-19.  Has had anxiety regarding diet.  She has undergone counseling in the past regarding issues surrounding neglecting some serious health problems such as diabetes.  A form was  completed in April of this year for her to avoid direct contact with COVID-19 patients.  She says this has been honored at work and she is pleased she has not been exposed    Review of Systems currently no issues with back pain or sciatica     Objective:   Physical Exam Spent 10 minutes speaking with her about need to renew FMLA form and COVID 19 accommodations       Assessment & Plan:  History of chronic recurrent left sciatica  History of anxiety  Diabetes mellitus  Plan: Health maintenance exam due November 2020

## 2018-09-12 ENCOUNTER — Other Ambulatory Visit: Payer: Self-pay | Admitting: Internal Medicine

## 2018-09-12 MED ORDER — ALPRAZOLAM 0.5 MG PO TABS: ORAL_TABLET | ORAL | 5 refills | Status: DC

## 2018-09-12 MED FILL — ALPRAZolam 0.5 MG TABS: 0.5 | 30 days supply | Qty: 60 | Fill #0

## 2018-09-12 NOTE — Telephone Encounter (Signed)
FMLA paperwork complete and faxed to [employer]

## 2018-09-12 NOTE — Telephone Encounter (Signed)
Alexa [name] called to say when she was here the other day, she was going to get a refill on Xanax and the pharmacy has not received that yet.  [name]

## 2018-09-13 ENCOUNTER — Encounter (INDEPENDENT_AMBULATORY_CARE_PROVIDER_SITE_OTHER): Payer: Self-pay | Admitting: Family Medicine

## 2018-09-13 ENCOUNTER — Telehealth (INDEPENDENT_AMBULATORY_CARE_PROVIDER_SITE_OTHER): Payer: No Typology Code available for payment source | Admitting: Family Medicine

## 2018-09-13 ENCOUNTER — Other Ambulatory Visit: Payer: Self-pay

## 2018-09-13 DIAGNOSIS — Z6831 Body mass index (BMI) 31.0-31.9, adult: Secondary | ICD-10-CM | POA: Diagnosis not present

## 2018-09-13 DIAGNOSIS — E669 Obesity, unspecified: Secondary | ICD-10-CM | POA: Diagnosis not present

## 2018-09-13 DIAGNOSIS — E66811 Obesity, class 1: Secondary | ICD-10-CM

## 2018-09-13 DIAGNOSIS — F3289 Other specified depressive episodes: Secondary | ICD-10-CM

## 2018-09-13 DIAGNOSIS — E1140 Type 2 diabetes mellitus with diabetic neuropathy, unspecified: Secondary | ICD-10-CM

## 2018-09-14 NOTE — Progress Notes (Signed)
Office: [phone]  /  Fax: [phone] TeleHealth Visit:  [name] has verbally consented to this TeleHealth visit today. The patient is located at work, the provider is located at the [employer] [facility] office. The participants in this visit include the listed provider and patient. The visit was conducted today via FaceTime.  HPI:   Chief Complaint: OBESITY Alexa [name] is here to discuss her progress with her obesity treatment plan. She is on the Category 2 plan and is following her eating plan approximately 0% of the time. She states she is exercising 0 minutes 0 times per week. Alexa [name]'s last in-office visit was 03/24/2018 and her weight was 172 lbs. She reports a weight of 175 lbs today. She is trying eat protein. She reports recent anxiety due to COVID-19 and societal unrest. She states she has been drinking a lot of Coke. She also was not being adherent with any medications until recently. Alexa [name] is taking 1 Contrave in the AM. Her weight goal is 160 lbs. We were unable to weigh the patient today for this TeleHealth visit. She feels as if she has gained weight since her last visit. She has lost 0 lbs since starting treatment with [name].  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 Alexa [name] does not report checking her blood sugars. Last A1c was 8.2 on 09/08/2018. She has been working on intensive lifestyle modifications including diet, exercise, and weight loss to help control her blood glucose levels. Alexa [name] had not been taking her medications for the last few months but is now back on her diabetic medications (Victoza and metformin).  Lab Results  Component Value Date   HGBA1C 8.2 (A) 09/08/2018    Depression  Alexa [name] reports an increase in anxiety and depression secondary to COVID-19 and societal unrest. She is being managed by her PCP and sees a counselor regularly. She reports  Xanax was recently added to her regimen.  Depression screen [name] 2/9 12/13/2017 11/16/2017 11/24/2016 06/20/2014 06/12/2013  Decreased Interest 1 2 1  0 0  Down, Depressed, Hopeless 1 3 1  0 0  PHQ - 2 Score 2 5 2  0 0  Altered sleeping 0 1 1 - -  Tired, decreased energy 1 2 1  - -  Change in appetite 2 2 1  - -  Feeling bad or failure about yourself  1 2 1  - -  Trouble concentrating 0 1 0 - -  Moving slowly or fidgety/restless 0 2 0 - -  Suicidal thoughts 0 2 0 - -  PHQ-9 Score 6 17 6  - -  Difficult doing work/chores - Somewhat difficult Not difficult at all - -   ASSESSMENT AND PLAN:  No diagnosis found.  PLAN:  Diabetes Mellitus with Peripheral Neuropathy Alexa [name] has been given extensive diabetes education by myself today including ideal fasting and post-prandial blood glucose readings, individual ideal HgA1c goals  and hypoglycemia prevention. We discussed the importance of good blood sugar control to decrease the likelihood of diabetic complications such as nephropathy, neuropathy, limb loss, blindness, coronary artery disease, and death. We discussed the importance of intensive lifestyle modification including diet, exercise and weight loss as the first line treatment for diabetes. Alexa [name] was advised to take all medications as directed by Endocrinology.  Depression  Alexa [name] will follow-up with her PCP for management of depression/anxiety.  Obesity Alexa [name] is currently in the action stage of change. As such, her goal is to continue with  weight loss efforts. She has agreed to better portion control and make smarter food choices. She will try to get 85 grams of protein per day. She was instructed to decrease Coke and simple carbs. Alexa [name] will try Viactiv chews to be more compliant with calcium intake. She will take 1 Contrave BID. Alexa [name] has not been prescribed exercise at this time.  We discussed the following Behavioral Modification Strategies today: increasing lean protein intake,  decrease liquid calories, and planning for success.  Alexa [name] has agreed to follow-up with our clinic in 3 weeks. She was informed of the importance of frequent follow-up visits to maximize her success with intensive lifestyle modifications for her multiple health conditions.  ALLERGIES: Allergies  Allergen Reactions  . Theophyllines Palpitations    MEDICATIONS: Current Outpatient Medications on File Prior to Visit  Medication Sig Dispense Refill  . albuterol (PROVENTIL HFA;VENTOLIN HFA) 108 (90 Base) MCG/ACT inhaler Inhale 2 puffs into the lungs every 6 (six) hours as needed for wheezing or shortness of breath. 1 Inhaler 11  . albuterol (PROVENTIL) (2.5 MG/3ML) 0.083% nebulizer solution Take 3 mLs (2.5 mg total) by nebulization every 6 (six) hours as needed for wheezing or shortness of breath. 150 mL 1  . ALPRAZolam (XANAX) 0.5 MG tablet One half to one tab po twice daily prn anxiety 60 tablet 5  . Blood Glucose Monitoring Suppl (FREESTYLE LITE) DEVI Use to check glucose 3x daily 2 each 5  . CALCIUM-VITAMIN D PO Take 800 mg by mouth 2 (two) times daily with a meal.     . conjugated estrogens (PREMARIN) vaginal cream Place 1 Applicatorful vaginally at bedtime. Use for 21 days then off for 7 days    . COREG 3.125 MG tablet Take 1 tablet (3.125 mg total) by mouth 2 (two) times daily with a meal. 60 tablet 11  . Cyanocobalamin (VITAMIN B 12) 100 MCG LOZG Take by mouth.    . cyclobenzaprine (FLEXERIL) 10 MG tablet Take 1 tablet (10 mg total) by mouth 3 (three) times daily as needed for muscle spasms. 90 tablet 0  . escitalopram (LEXAPRO) 20 MG tablet Take 1 tablet (20 mg total) by mouth daily. 90 tablet 1  . Ferrous Sulfate (SLOW FE PO) Take 1 tablet by mouth daily.     Marland Kitchen glucose blood (FREESTYLE LITE) test strip Use to check glucose 3x daily 300 each 1  . ibuprofen (ADVIL,MOTRIN) 200 MG tablet Take 600 mg by mouth daily as needed for headache or moderate pain.    Marland Kitchen levothyroxine (SYNTHROID) 25  MCG tablet TAKE 1 TABLET BY MOUTH ONCE DAILY 90 tablet 1  . metFORMIN (GLUCOPHAGE) 1000 MG tablet TAKE 1 TABLET BY MOUTH 2 TIMES DAILY WITH A MEAL. 180 tablet 1  . Multiple Vitamin (MULTIVITAMIN) tablet Take 1 tablet by mouth daily. Reported on 02/26/2015 30 tablet 0  . Naltrexone-buPROPion HCl ER 8-90 MG TB12 Take by mouth.    . ondansetron (ZOFRAN) 4 MG tablet Take 1 tablet (4 mg total) by mouth every 8 (eight) hours as needed for nausea or vomiting. 20 tablet 0  . pantoprazole (PROTONIX) 40 MG tablet Take 1 tablet (40 mg total) by mouth daily. 90 tablet 4  . telmisartan (MICARDIS) 40 MG tablet Take 0.5 tablets (20 mg total) by mouth 2 (two) times daily. 90 tablet 3  . traMADol (ULTRAM) 50 MG tablet Take 1 tablet (50 mg total) by mouth daily as needed for moderate pain or severe pain. 30 tablet 1  . VICTOZA 18  MG/3ML SOPN INJECT 1.8 MG DAILY AS DIRECTED 27 mL 1  . Vitamin D, Ergocalciferol, (DRISDOL) 50000 units CAPS capsule Take 50,000 Units by mouth every Sunday.     No current facility-administered medications on file prior to visit.     PAST MEDICAL HISTORY: Past Medical History:  Diagnosis Date  . Anemia, iron deficiency   . Anhedonia   . Anxiety   . Asthma   . Asthma, chronic   . Back pain   . CAD (coronary artery disease)   . Chewing difficulty   . Chronic insomnia   . Combined hyperlipidemia   . Constipation   . Depression   . Diabetes mellitus   . Diabetes mellitus type II   . Diabetic autonomic neuropathy (HCC)   . Difficulty swallowing pills   . DM type 2 with diabetic peripheral neuropathy (HCC)   . Dry mouth   . Dyspepsia   . Dyspnea   . Elevated homocysteine (HCC)   . Essential tremor   . Fatigue   . Fatty liver   . GERD (gastroesophageal reflux disease)   . GERD (gastroesophageal reflux disease)   . Glossitis   . Goiter   . Hemorrhoids   . HLD (hyperlipidemia)   . Hyperparathyroidism , secondary, non-renal (HCC)   . Hypertension   . Hypocalcemia   .  Insomnia   . Leg edema   . Leg pain   . Macular degeneration   . Nonproliferative diabetic retinopathy associated with type 2 diabetes mellitus (HCC)    RESOLVED  . Obesity, Class III, BMI 40-49.9 (morbid obesity) (HCC)   . Osteopenia   . Osteoporosis   . Pallor   . Status post gastric surgery   . Tachycardia   . Thyroiditis, autoimmune   . Vaginal dryness, menopausal   . Vitamin D deficiency     PAST SURGICAL HISTORY: Past Surgical History:  Procedure Laterality Date  . BREAST REDUCTION SURGERY Bilateral 12/14/2017   Procedure: BILATERAL MAMMARY REDUCTION  (BREAST);  Surgeon: Thimmappa, Brinda, MD;  Location: [facility];  Service: Plastics;  Laterality: Bilateral;  . CARDIAC CATHETERIZATION     x 2  . ROUX-EN-Y PROCEDURE    . TONSILLECTOMY      SOCIAL HISTORY: Social History   Tobacco Use  . Smoking status: Never Smoker  . Smokeless tobacco: Never Used  Substance Use Topics  . Alcohol use: No    Alcohol/week: 0.0 standard drinks  . Drug use: No    FAMILY HISTORY: Family History  Problem Relation Age of Onset  . Thyroid disease Mother        Hypothyroid  . Depression Mother   . Liver disease Mother   . Obesity Mother   . Diabetes Father        T2 DM  . Hypertension Father   . Hyperlipidemia Father   . Heart disease Father   . Obesity Brother        25 0 pounds at a height of 76 inches.  . Thyroid disease Sister        Hypothyroid  . Diabetes Maternal Grandmother   . Colon cancer Other        great grandfather  . Liver disease Maternal Aunt        NASH  . Esophageal cancer Neg Hx   . Pancreatic cancer Neg Hx   . Kidney disease Neg Hx    ROS: Review of Systems  Neurological:       Positive for DM  with peripheral neuropathy.  Psychiatric/Behavioral: Positive for depression. The patient is nervous/anxious.    PHYSICAL EXAM: Pt in no acute distress  RECENT LABS AND TESTS: BMET    Component Value Date/Time   NA 134 (L) 12/02/2017  1100   K 4.1 12/02/2017 1100   CL 103 12/02/2017 1100   CO2 20 (L) 12/02/2017 1100   GLUCOSE 322 (H) 12/02/2017 1100   BUN 20 12/02/2017 1100   CREATININE 1.17 (H) 12/02/2017 1100   CREATININE 1.27 (H) 06/08/2017 0000   CALCIUM 8.9 12/02/2017 1100   CALCIUM 9.3 08/14/2011 1217   GFRNONAA 51 (L) 12/02/2017 1100   GFRNONAA 67 11/17/2016 0941   GFRAA 59 (L) 12/02/2017 1100   GFRAA 78 11/17/2016 0941   Lab Results  Component Value Date   HGBA1C 8.2 (A) 09/08/2018   HGBA1C 7.7 (A) 03/24/2018   HGBA1C 6.9 (A) 11/01/2017   HGBA1C 10.0 04/12/2017   HGBA1C 13.4 (H) 11/17/2016   Lab Results  Component Value Date   INSULIN 8.1 11/16/2017   CBC    Component Value Date/Time   WBC 8.1 12/02/2017 1651   RBC 4.13 12/02/2017 1651   HGB 11.8 12/02/2017 1651   HCT 35.3 12/02/2017 1651   PLT 407 (H) 12/02/2017 1651   MCV 85.5 12/02/2017 1651   MCH 28.6 12/02/2017 1651   MCHC 33.4 12/02/2017 1651   RDW 12.6 12/02/2017 1651   LYMPHSABS 2,673 12/02/2017 1651   MONOABS 0.8 10/19/2015 1327   EOSABS 113 12/02/2017 1651   BASOSABS 57 12/02/2017 1651   Iron/TIBC/Ferritin/ %Sat    Component Value Date/Time   IRON 55 05/08/2013 1622   TIBC 357 04/14/2012 1059   IRONPCTSAT 57 (H) 04/14/2012 1059   Lipid Panel     Component Value Date/Time   CHOL 188 06/08/2017 0000   TRIG 110 06/08/2017 0000   HDL 67 06/08/2017 0000   CHOLHDL 2.8 06/08/2017 0000   VLDL 10 03/05/2017 1903   LDLCALC 100 (H) 06/08/2017 0000   Hepatic Function Panel     Component Value Date/Time   PROT 6.3 12/02/2017 1651   ALBUMIN 4.1 05/27/2016 0828   AST 13 12/02/2017 1651   ALT 13 12/02/2017 1651   ALKPHOS 85 05/27/2016 0828   BILITOT 0.3 12/02/2017 1651   BILIDIR 0.1 12/02/2017 1651   IBILI 0.2 12/02/2017 1651      Component Value Date/Time   TSH 1.80 06/08/2017 0000   TSH 0.938 03/05/2017 1903   TSH 1.54 11/17/2016 0941   TSH 2.62 05/27/2016 0828   Results for PAYTYN, MESTA (MRN [id number]) as of  09/14/2018 14:33  Ref. Range 11/01/2017 00:00  Vitamin D, 25-Hydroxy Latest Ref Range: 30 - 100 ng/mL 64   I, Michaelene Song, am acting as Location manager for [employer], FNP  I have reviewed the above documentation for accuracy and completeness, and I agree with the above.  - Kadra Kohan, FNP-C.

## 2018-09-21 ENCOUNTER — Other Ambulatory Visit: Payer: Self-pay | Admitting: Internal Medicine

## 2018-09-21 DIAGNOSIS — Z1231 Encounter for screening mammogram for malignant neoplasm of breast: Secondary | ICD-10-CM

## 2018-09-30 ENCOUNTER — Ambulatory Visit: Payer: No Typology Code available for payment source

## 2018-10-06 ENCOUNTER — Ambulatory Visit: Payer: No Typology Code available for payment source

## 2018-10-10 MED FILL — TELMISARTAN 40 MG TABLET: 40 | 90 days supply | Qty: 90 | Fill #0

## 2018-10-10 MED FILL — CARVEDILOL 3.125 MG TABLET: 3.125 | 90 days supply | Qty: 180 | Fill #0

## 2018-10-11 MED FILL — ALPRAZolam 0.5 MG TABS: 0.5 | 30 days supply | Qty: 60 | Fill #1

## 2018-10-17 ENCOUNTER — Ambulatory Visit (INDEPENDENT_AMBULATORY_CARE_PROVIDER_SITE_OTHER): Payer: No Typology Code available for payment source | Admitting: Family Medicine

## 2018-10-17 ENCOUNTER — Other Ambulatory Visit: Payer: Self-pay

## 2018-10-17 VITALS — BP 118/81 | HR 89 | Temp 97.8°F | Ht 63.0 in | Wt 179.4 lb

## 2018-10-17 DIAGNOSIS — E669 Obesity, unspecified: Secondary | ICD-10-CM

## 2018-10-17 DIAGNOSIS — Z6831 Body mass index (BMI) 31.0-31.9, adult: Secondary | ICD-10-CM

## 2018-10-17 DIAGNOSIS — E1142 Type 2 diabetes mellitus with diabetic polyneuropathy: Secondary | ICD-10-CM

## 2018-10-19 NOTE — Progress Notes (Signed)
Office: [phone]  /  Fax: [phone]   HPI:   Chief Complaint: OBESITY Alexa [name] is here to discuss her progress with her obesity treatment plan. She is on the Category 2 plan and is following her eating plan approximately 10 % of the time. She states she is exercising 0 minutes 0 times per week. Infiniti has increased her protein and she has focused on eating her protein first. She is also trying to increase her calcium intake. [name] admits to not being fully invested in the program. [name] is drinking some soda and she eats too much candy. Her weight is 179 lb 6.4 oz (81.4 kg) today and has had a weight gain of 7 pounds since her last in-office visit. She has gained 10 lbs since starting treatment with [name].  Diabetes II with peripheral neuropathy [name] has a diagnosis of diabetes type II. She has not taken her diabetes medications for the last four days.She reports it is due to "deeply rooted psychological reasons". She is on Victoza and Metformin. She is not well controlled. She has been working on intensive lifestyle modifications including diet, exercise, and weight loss to help control her blood glucose levels. [name] will continue to see her counselor. Lab Results  Component Value Date   HGBA1C 8.2 (A) 09/08/2018    ASSESSMENT AND PLAN:  Type 2 diabetes mellitus with peripheral neuropathy (HCC)  Class 1 obesity with serious comorbidity and body mass index (BMI) of 31.0 to 31.9 in adult, unspecified obesity type  PLAN:  Diabetes II with peripheral neuropathy [name] has been given extensive diabetes education by myself today including ideal fasting and post-prandial blood glucose readings, individual ideal Hgb A1c goals and hypoglycemia prevention. We discussed the importance of good blood sugar control to decrease the likelihood of diabetic complications such as nephropathy, neuropathy, limb loss, blindness, coronary artery disease, and death. We discussed the importance of  intensive lifestyle modification including diet, exercise and weight loss as the first line treatment for diabetes. [name] was encouraged to take her medications. She will follow up at the agreed upon time.  Obesity [name] is currently in the action stage of change. As such, her goal is to continue with weight loss efforts She has agreed to portion control better and make smarter food choices, such as increase vegetables and decrease simple carbohydrates  [name] has been instructed to work up to a goal of 150 minutes of combined cardio and strengthening exercise per week for weight loss and overall health benefits. We discussed the following Behavioral Modification Strategies today: planning for success, increasing lean protein intake and decreasing simple carbohydrates   [name] will not buy candy and keep it at her house. She will focus on eating her protein first.  [name] has agreed to follow up with our clinic in 2 weeks. She was informed of the importance of frequent follow up visits to maximize her success with intensive lifestyle modifications for her multiple health conditions.  ALLERGIES: Allergies  Allergen Reactions  . Theophyllines Palpitations    MEDICATIONS: Current Outpatient Medications on File Prior to Visit  Medication Sig Dispense Refill  . albuterol (PROVENTIL HFA;VENTOLIN HFA) 108 (90 Base) MCG/ACT inhaler Inhale 2 puffs into the lungs every 6 (six) hours as needed for wheezing or shortness of breath. 1 Inhaler 11  . albuterol (PROVENTIL) (2.5 MG/3ML) 0.083% nebulizer solution Take 3 mLs (2.5 mg total) by nebulization every 6 (six) hours as needed for wheezing or shortness of breath. 150 mL 1  . ALPRAZolam (  XANAX) 0.5 MG tablet One half to one tab po twice daily prn anxiety 60 tablet 5  . Blood Glucose Monitoring Suppl (FREESTYLE LITE) DEVI Use to check glucose 3x daily 2 each 5  . CALCIUM-VITAMIN D PO Take 800 mg by mouth 2 (two) times daily with a meal.     .  conjugated estrogens (PREMARIN) vaginal cream Place 1 Applicatorful vaginally at bedtime. Use for 21 days then off for 7 days    . COREG 3.125 MG tablet Take 1 tablet (3.125 mg total) by mouth 2 (two) times daily with a meal. 60 tablet 11  . Cyanocobalamin (VITAMIN B 12) 100 MCG LOZG Take by mouth.    . cyclobenzaprine (FLEXERIL) 10 MG tablet Take 1 tablet (10 mg total) by mouth 3 (three) times daily as needed for muscle spasms. 90 tablet 0  . escitalopram (LEXAPRO) 20 MG tablet Take 1 tablet (20 mg total) by mouth daily. 90 tablet 1  . Ferrous Sulfate (SLOW FE PO) Take 1 tablet by mouth daily.     Marland Kitchen glucose blood (FREESTYLE LITE) test strip Use to check glucose 3x daily 300 each 1  . ibuprofen (ADVIL,MOTRIN) 200 MG tablet Take 600 mg by mouth daily as needed for headache or moderate pain.    Marland Kitchen levothyroxine (SYNTHROID) 25 MCG tablet TAKE 1 TABLET BY MOUTH ONCE DAILY 90 tablet 1  . metFORMIN (GLUCOPHAGE) 1000 MG tablet TAKE 1 TABLET BY MOUTH 2 TIMES DAILY WITH A MEAL. 180 tablet 1  . Multiple Vitamin (MULTIVITAMIN) tablet Take 1 tablet by mouth daily. Reported on 02/26/2015 30 tablet 0  . Naltrexone-buPROPion HCl ER 8-90 MG TB12 Take by mouth.    . ondansetron (ZOFRAN) 4 MG tablet Take 1 tablet (4 mg total) by mouth every 8 (eight) hours as needed for nausea or vomiting. 20 tablet 0  . pantoprazole (PROTONIX) 40 MG tablet Take 1 tablet (40 mg total) by mouth daily. 90 tablet 4  . telmisartan (MICARDIS) 40 MG tablet Take 0.5 tablets (20 mg total) by mouth 2 (two) times daily. 90 tablet 3  . traMADol (ULTRAM) 50 MG tablet Take 1 tablet (50 mg total) by mouth daily as needed for moderate pain or severe pain. 30 tablet 1  . VICTOZA 18 MG/3ML SOPN INJECT 1.8 MG DAILY AS DIRECTED 27 mL 1  . Vitamin D, Ergocalciferol, (DRISDOL) 50000 units CAPS capsule Take 50,000 Units by mouth every Sunday.     No current facility-administered medications on file prior to visit.     PAST MEDICAL HISTORY: Past  Medical History:  Diagnosis Date  . Anemia, iron deficiency   . Anhedonia   . Anxiety   . Asthma   . Asthma, chronic   . Back pain   . CAD (coronary artery disease)   . Chewing difficulty   . Chronic insomnia   . Combined hyperlipidemia   . Constipation   . Depression   . Diabetes mellitus   . Diabetes mellitus type II   . Diabetic autonomic neuropathy ([place])   . Difficulty swallowing pills   . DM type 2 with diabetic peripheral neuropathy ([place])   . Dry mouth   . Dyspepsia   . Dyspnea   . 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 Essential tremor   . Fatigue   . Fatty liver   . GERD (gastroesophageal reflux disease)   . GERD (gastroesophageal reflux disease)   . Glossitis   . Goiter   . Hemorrhoids   . HLD (hyperlipidemia)   .  Hyperparathyroidism , secondary, non-renal ([place])   . Hypertension   . Hypocalcemia   . Insomnia   . Leg edema   . Leg pain   . Macular degeneration   . Nonproliferative diabetic retinopathy associated with type 2 diabetes mellitus ([place])    RESOLVED  . Obesity, Class III, BMI 40-49.9 (morbid obesity) ([place])   . Osteopenia   . Osteoporosis   . Pallor   . Status post gastric surgery   . Tachycardia   . Thyroiditis, autoimmune   . Vaginal dryness, menopausal   . Vitamin D deficiency     PAST SURGICAL HISTORY: Past Surgical History:  Procedure Laterality Date  . BREAST REDUCTION SURGERY Bilateral 12/14/2017   Procedure: BILATERAL MAMMARY REDUCTION  (BREAST);  Surgeon: Irene Limbo, MD;  Location: [facility];  Service: Plastics;  Laterality: Bilateral;  . CARDIAC CATHETERIZATION     x 2  . ROUX-EN-Y PROCEDURE    . TONSILLECTOMY      SOCIAL HISTORY: Social History   Tobacco Use  . Smoking status: Never Smoker  . Smokeless tobacco: Never Used  Substance Use Topics  . Alcohol use: No    Alcohol/week: 0.0 standard drinks  . Drug use: No    FAMILY HISTORY: Family History  Problem Relation Age of Onset  . Thyroid disease  Mother        Hypothyroid  . Depression Mother   . Liver disease Mother   . Obesity Mother   . Diabetes Father        T2 DM  . Hypertension Father   . Hyperlipidemia Father   . Heart disease Father   . Obesity Brother        250 pounds at a height of 76 inches.  . Thyroid disease Sister        Hypothyroid  . Diabetes Maternal Grandmother   . Colon cancer Other        great grandfather  . Liver disease Maternal Aunt        NASH  . Esophageal cancer Neg Hx   . Pancreatic cancer Neg Hx   . Kidney disease Neg Hx     ROS: Review of Systems  Constitutional: Negative for weight loss.    PHYSICAL EXAM: Blood pressure 118/81, pulse 89, temperature 97.8 F (36.6 C), temperature source Oral, height 5\' 3"  (1.6 m), weight 179 lb 6.4 oz (81.4 kg), last menstrual period 05/11/2010, SpO2 96 %. Body mass index is 31.78 kg/m. Physical Exam Vitals signs reviewed.  Constitutional:      Appearance: Normal appearance. She is well-developed. She is obese.  Cardiovascular:     Rate and Rhythm: Normal rate.  Pulmonary:     Effort: Pulmonary effort is normal.  Musculoskeletal: Normal range of motion.  Skin:    General: Skin is warm and dry.  Neurological:     Mental Status: She is alert and oriented to person, place, and time.  Psychiatric:        Mood and Affect: Mood normal.        Behavior: Behavior normal.     RECENT LABS AND TESTS: BMET    Component Value Date/Time   NA 134 (L) 12/02/2017 1100   K 4.1 12/02/2017 1100   CL 103 12/02/2017 1100   CO2 20 (L) 12/02/2017 1100   GLUCOSE 322 (H) 12/02/2017 1100   BUN 20 12/02/2017 1100   CREATININE 1.17 (H) 12/02/2017 1100   CREATININE 1.27 (H) 06/08/2017 0000   CALCIUM 8.9 12/02/2017  1100   CALCIUM 9.3 08/14/2011 1217   GFRNONAA 51 (L) 12/02/2017 1100   GFRNONAA 67 11/17/2016 0941   GFRAA 59 (L) 12/02/2017 1100   GFRAA 78 11/17/2016 0941   Lab Results  Component Value Date   HGBA1C 8.2 (A) 09/08/2018   HGBA1C 7.7 (A)  03/24/2018   HGBA1C 6.9 (A) 11/01/2017   HGBA1C 10.0 04/12/2017   HGBA1C 13.4 (H) 11/17/2016   Lab Results  Component Value Date   INSULIN 8.1 11/16/2017   CBC    Component Value Date/Time   WBC 8.1 12/02/2017 1651   RBC 4.13 12/02/2017 1651   HGB 11.8 12/02/2017 1651   HCT 35.3 12/02/2017 1651   PLT 407 (H) 12/02/2017 1651   MCV 85.5 12/02/2017 1651   MCH 28.6 12/02/2017 1651   MCHC 33.4 12/02/2017 1651   RDW 12.6 12/02/2017 1651   LYMPHSABS 2,673 12/02/2017 1651   MONOABS 0.8 10/19/2015 1327   EOSABS 113 12/02/2017 1651   BASOSABS 57 12/02/2017 1651   Iron/TIBC/Ferritin/ %Sat    Component Value Date/Time   IRON 55 05/08/2013 1622   TIBC 357 04/14/2012 1059   IRONPCTSAT 57 (H) 04/14/2012 1059   Lipid Panel     Component Value Date/Time   CHOL 188 06/08/2017 0000   TRIG 110 06/08/2017 0000   HDL 67 06/08/2017 0000   CHOLHDL 2.8 06/08/2017 0000   VLDL 10 03/05/2017 1903   LDLCALC 100 (H) 06/08/2017 0000   Hepatic Function Panel     Component Value Date/Time   PROT 6.3 12/02/2017 1651   ALBUMIN 4.1 05/27/2016 0828   AST 13 12/02/2017 1651   ALT 13 12/02/2017 1651   ALKPHOS 85 05/27/2016 0828   BILITOT 0.3 12/02/2017 1651   BILIDIR 0.1 12/02/2017 1651   IBILI 0.2 12/02/2017 1651      Component Value Date/Time   TSH 1.80 06/08/2017 0000   TSH 0.938 03/05/2017 1903   TSH 1.54 11/17/2016 0941   TSH 2.62 05/27/2016 0828     Ref. Range 11/01/2017 00:00  Vitamin D, 25-Hydroxy Latest Ref Range: 30 - 100 ng/mL 64    OBESITY BEHAVIORAL INTERVENTION VISIT  Today's visit was # 8   Starting weight: 169 lbs Starting date: 11/16/2017 Today's weight : 179 lbs Today's date: 10/17/2018 Total lbs lost to date: 0    10/17/2018  Height 5\' 3"  (1.6 m)  Weight 179 lb 6.4 oz (81.4 kg)  BMI (Calculated) 31.79  BLOOD PRESSURE - SYSTOLIC [id number]  BLOOD PRESSURE - DIASTOLIC 81   Body Fat % 0000000 %  Total Body Water (lbs) 68.2 lbs    ASK: We discussed the diagnosis of  obesity with [name] today and Annaleia agreed to give [name] permission to discuss obesity behavioral modification therapy today.  ASSESS: Narumi has the diagnosis of obesity and her BMI today is 31.79 Kibibi is in the action stage of change   ADVISE: Martesha was educated on the multiple health risks of obesity as well as the benefit of weight loss to improve her health. She was advised of the need for long term treatment and the importance of lifestyle modifications to improve her current health and to decrease her risk of future health problems.  AGREE: Multiple dietary modification options and treatment options were discussed and  Coralei agreed to follow the recommendations documented in the above note.  ARRANGE: Celisse was educated on the importance of frequent visits to treat obesity as outlined per CMS and USPSTF guidelines and agreed to  schedule her next follow up appointment today.  I, Doreene Nest, am acting as transcriptionist for [employer], FNP-C  I have reviewed the above documentation for accuracy and completeness, and I agree with the above.  - Dawn Whitmire, FNP-C.

## 2018-10-20 ENCOUNTER — Encounter (INDEPENDENT_AMBULATORY_CARE_PROVIDER_SITE_OTHER): Payer: Self-pay | Admitting: Family Medicine

## 2018-10-21 ENCOUNTER — Ambulatory Visit: Payer: No Typology Code available for payment source

## 2018-10-28 ENCOUNTER — Other Ambulatory Visit: Payer: Self-pay

## 2018-10-28 ENCOUNTER — Ambulatory Visit: Payer: Self-pay | Admitting: Podiatry

## 2018-10-28 DIAGNOSIS — B351 Tinea unguium: Secondary | ICD-10-CM

## 2018-11-03 NOTE — Progress Notes (Signed)
Pt presents with mycotic infection of nails 1-5 bilateral  All other systems are negative  Laser therapy administered to affected nails and tolerated well. All safety precautions were in place.  Follow up prn    

## 2018-11-04 ENCOUNTER — Encounter: Payer: Self-pay | Admitting: Internal Medicine

## 2018-11-04 LAB — HM DIABETES EYE EXAM

## 2018-11-09 MED FILL — ALPRAZolam 0.5 MG TABS: 0.5 | 30 days supply | Qty: 60 | Fill #2

## 2018-11-10 ENCOUNTER — Telehealth (INDEPENDENT_AMBULATORY_CARE_PROVIDER_SITE_OTHER): Payer: No Typology Code available for payment source | Admitting: Family Medicine

## 2018-11-10 ENCOUNTER — Encounter (INDEPENDENT_AMBULATORY_CARE_PROVIDER_SITE_OTHER): Payer: Self-pay | Admitting: Family Medicine

## 2018-11-10 ENCOUNTER — Other Ambulatory Visit: Payer: Self-pay

## 2018-11-10 DIAGNOSIS — Z6831 Body mass index (BMI) 31.0-31.9, adult: Secondary | ICD-10-CM

## 2018-11-10 DIAGNOSIS — E1140 Type 2 diabetes mellitus with diabetic neuropathy, unspecified: Secondary | ICD-10-CM | POA: Diagnosis not present

## 2018-11-10 DIAGNOSIS — E669 Obesity, unspecified: Secondary | ICD-10-CM | POA: Diagnosis not present

## 2018-11-15 NOTE — Progress Notes (Signed)
Office: [phone]  /  Fax: [phone] TeleHealth Visit:  Alexa [name] has verbally consented to this TeleHealth visit today. The patient is located at home in [place], the provider is located at the [employer] [facility] office. The participants in this visit include the listed provider and patient and any and all parties involved. The visit was conducted today via telephone. Alexa [name] was unable to use realtime audiovisual technology today and the telehealth visit was conducted via telephone (switched to phone, due to phone coverage). The call was 25 minutes.  HPI:   Chief Complaint: OBESITY Alexa [name] is here to discuss her progress with her obesity treatment plan. She is on the portion control better and make smarter food choices plan. She states she is exercising 0 minutes 0 times per week. Alexa [name] is working toward getting 85 grams per day of protein. She is eating her protein first and she is eating more protein. Alexa [name] has incorporated more high protein foods. She has decreased the amount of candy that she brings into the house. Alexa [name] is taking one pill per day of Contrave. We were unable to weigh the patient today for this TeleHealth visit. She feels as if she has gained weight since her last visit. She has lost 10 lbs since starting treatment with [name].  Diabetes II with neuropathy, non-insulin (not well controlled) Alexa [name] has a diagnosis of diabetes type II. Dashanti does not check her blood sugars every day. Her blood sugars run between 140 and 180. She is taking her medication more consistently. She has a history on non-compliance with medications. Arkesha denies any hypoglycemic episodes. Last A1c was at 8.2 (09/08/18). She has been working on intensive lifestyle modifications including diet, exercise, and weight loss to help control her blood glucose levels.  ASSESSMENT AND PLAN:  Type 2 diabetes mellitus with diabetic neuropathy, without long-term current use of insulin (HCC)   Class 1 obesity with serious comorbidity and body mass index (BMI) of 31.0 to 31.9 in adult, unspecified obesity type  PLAN:  Diabetes II with neuropathy, non-insulin (not well controlled) Alexa [name] has been given extensive diabetes education by myself today including ideal fasting and post-prandial blood glucose readings, individual ideal Hgb A1c goals and hypoglycemia prevention. We discussed the importance of good blood sugar control to decrease the likelihood of diabetic complications such as nephropathy, neuropathy, limb loss, blindness, coronary artery disease, and death. We discussed the importance of intensive lifestyle modification including diet, exercise and weight loss as the first line treatment for diabetes. Enyia agrees to continue Victoza and Metformin. She will be more consistent with taking her medications. Ilynn agrees to follow up at the agreed upon time.  Obesity Alexa [name] is currently in the action stage of change. As such, her goal is to continue with weight loss efforts She has agreed to portion control better and make smarter food choices, such as increase vegetables and decrease simple carbohydrates  Alexa [name] has been instructed to work up to a goal of 150 minutes of combined cardio and strengthening exercise per week for weight loss and overall health benefits. We discussed the following Behavioral Modification Strategies today: planning for success, increase H2O intake, keeping healthy foods in the home, increasing lean protein intake and decreasing simple carbohydrates   Azmina will make Alexa [name] oatmeal with [employer] milk for breakfast to increase her protein. She will continue 1 pill per day of Contrave.  Alexa [name] has agreed to follow up with our clinic in 3 weeks. She was informed of the importance  of frequent follow up visits to maximize her success with intensive lifestyle modifications for her multiple health conditions.  ALLERGIES: Allergies  Allergen Reactions  .  Theophyllines Palpitations    MEDICATIONS: Current Outpatient Medications on File Prior to Visit  Medication Sig Dispense Refill  . albuterol (PROVENTIL HFA;VENTOLIN HFA) 108 (90 Base) MCG/ACT inhaler Inhale 2 puffs into the lungs every 6 (six) hours as needed for wheezing or shortness of breath. 1 Inhaler 11  . albuterol (PROVENTIL) (2.5 MG/3ML) 0.083% nebulizer solution Take 3 mLs (2.5 mg total) by nebulization every 6 (six) hours as needed for wheezing or shortness of breath. 150 mL 1  . ALPRAZolam (XANAX) 0.5 MG tablet One half to one tab po twice daily prn anxiety 60 tablet 5  . Blood Glucose Monitoring Suppl (FREESTYLE LITE) DEVI Use to check glucose 3x daily 2 each 5  . CALCIUM-VITAMIN D PO Take 800 mg by mouth 2 (two) times daily with a meal.     . conjugated estrogens (PREMARIN) vaginal cream Place 1 Applicatorful vaginally at bedtime. Use for 21 days then off for 7 days    . COREG 3.125 MG tablet Take 1 tablet (3.125 mg total) by mouth 2 (two) times daily with a meal. 60 tablet 11  . Cyanocobalamin (VITAMIN B 12) 100 MCG LOZG Take by mouth.    . cyclobenzaprine (FLEXERIL) 10 MG tablet Take 1 tablet (10 mg total) by mouth 3 (three) times daily as needed for muscle spasms. 90 tablet 0  . escitalopram (LEXAPRO) 20 MG tablet Take 1 tablet (20 mg total) by mouth daily. 90 tablet 1  . Ferrous Sulfate (SLOW FE PO) Take 1 tablet by mouth daily.     Marland Kitchen glucose blood (FREESTYLE LITE) test strip Use to check glucose 3x daily 300 each 1  . ibuprofen (ADVIL,MOTRIN) 200 MG tablet Take 600 mg by mouth daily as needed for headache or moderate pain.    Marland Kitchen levothyroxine (SYNTHROID) 25 MCG tablet TAKE 1 TABLET BY MOUTH ONCE DAILY 90 tablet 1  . metFORMIN (GLUCOPHAGE) 1000 MG tablet TAKE 1 TABLET BY MOUTH 2 TIMES DAILY WITH A MEAL. 180 tablet 1  . Multiple Vitamin (MULTIVITAMIN) tablet Take 1 tablet by mouth daily. Reported on 02/26/2015 30 tablet 0  . Naltrexone-buPROPion HCl ER 8-90 MG TB12 Take by mouth.     . ondansetron (ZOFRAN) 4 MG tablet Take 1 tablet (4 mg total) by mouth every 8 (eight) hours as needed for nausea or vomiting. 20 tablet 0  . pantoprazole (PROTONIX) 40 MG tablet Take 1 tablet (40 mg total) by mouth daily. 90 tablet 4  . telmisartan (MICARDIS) 40 MG tablet Take 0.5 tablets (20 mg total) by mouth 2 (two) times daily. 90 tablet 3  . traMADol (ULTRAM) 50 MG tablet Take 1 tablet (50 mg total) by mouth daily as needed for moderate pain or severe pain. 30 tablet 1  . VICTOZA 18 MG/3ML SOPN INJECT 1.8 MG DAILY AS DIRECTED 27 mL 1  . Vitamin D, Ergocalciferol, (DRISDOL) 50000 units CAPS capsule Take 50,000 Units by mouth every Sunday.     No current facility-administered medications on file prior to visit.     PAST MEDICAL HISTORY: Past Medical History:  Diagnosis Date  . Anemia, iron deficiency   . Anhedonia   . Anxiety   . Asthma   . Asthma, chronic   . Back pain   . CAD (coronary artery disease)   . Chewing difficulty   . Chronic insomnia   .  Combined hyperlipidemia   . Constipation   . Depression   . Diabetes mellitus   . Diabetes mellitus type II   . Diabetic autonomic neuropathy ([place])   . Difficulty swallowing pills   . DM type 2 with diabetic peripheral neuropathy ([place])   . Dry mouth   . Dyspepsia   . Dyspnea   . Elevated homocysteine   . Essential tremor   . Fatigue   . Fatty liver   . GERD (gastroesophageal reflux disease)   . GERD (gastroesophageal reflux disease)   . Glossitis   . Goiter   . Hemorrhoids   . HLD (hyperlipidemia)   . Hyperparathyroidism , secondary, non-renal ([place])   . Hypertension   . Hypocalcemia   . Insomnia   . Leg edema   . Leg pain   . Macular degeneration   . Nonproliferative diabetic retinopathy associated with type 2 diabetes mellitus ([place])    RESOLVED  . Obesity, Class III, BMI 40-49.9 (morbid obesity) ([place])   . Osteopenia   . Osteoporosis   . Pallor   . Status post gastric surgery   . Tachycardia   .  Thyroiditis, autoimmune   . Vaginal dryness, menopausal   . Vitamin D deficiency     PAST SURGICAL HISTORY: Past Surgical History:  Procedure Laterality Date  . BREAST REDUCTION SURGERY Bilateral 12/14/2017   Procedure: BILATERAL MAMMARY REDUCTION  (BREAST);  Surgeon: Irene Limbo, MD;  Location: [facility];  Service: Plastics;  Laterality: Bilateral;  . CARDIAC CATHETERIZATION     x 2  . ROUX-EN-Y PROCEDURE    . TONSILLECTOMY      SOCIAL HISTORY: Social History   Tobacco Use  . Smoking status: Never Smoker  . Smokeless tobacco: Never Used  Substance Use Topics  . Alcohol use: No    Alcohol/week: 0.0 standard drinks  . Drug use: No    FAMILY HISTORY: Family History  Problem Relation Age of Onset  . Thyroid disease Mother        Hypothyroid  . Depression Mother   . Liver disease Mother   . Obesity Mother   . Diabetes Father        T2 DM  . Hypertension Father   . Hyperlipidemia Father   . Heart disease Father   . Obesity Brother        250 pounds at a height of 76 inches.  . Thyroid disease Sister        Hypothyroid  . Diabetes Maternal Grandmother   . Colon cancer Other        great grandfather  . Liver disease Maternal Aunt        NASH  . Esophageal cancer Neg Hx   . Pancreatic cancer Neg Hx   . Kidney disease Neg Hx     ROS: Review of Systems  Constitutional: Negative for weight loss.  Endo/Heme/Allergies:       Negative for hypoglycemia    PHYSICAL EXAM: Pt in no acute distress  RECENT LABS AND TESTS: BMET    Component Value Date/Time   NA 134 (L) 12/02/2017 1100   K 4.1 12/02/2017 1100   CL 103 12/02/2017 1100   CO2 20 (L) 12/02/2017 1100   GLUCOSE 322 (H) 12/02/2017 1100   BUN 20 12/02/2017 1100   CREATININE 1.17 (H) 12/02/2017 1100   CREATININE 1.27 (H) 06/08/2017 0000   CALCIUM 8.9 12/02/2017 1100   CALCIUM 9.3 08/14/2011 1217   GFRNONAA 51 (L) 12/02/2017 1100  GFRNONAA 67 11/17/2016 0941   GFRAA 59 (L)  12/02/2017 1100   GFRAA 78 11/17/2016 0941   Lab Results  Component Value Date   HGBA1C 8.2 (A) 09/08/2018   HGBA1C 7.7 (A) 03/24/2018   HGBA1C 6.9 (A) 11/01/2017   HGBA1C 10.0 04/12/2017   HGBA1C 13.4 (H) 11/17/2016   Lab Results  Component Value Date   INSULIN 8.1 11/16/2017   CBC    Component Value Date/Time   WBC 8.1 12/02/2017 1651   RBC 4.13 12/02/2017 1651   HGB 11.8 12/02/2017 1651   HCT 35.3 12/02/2017 1651   PLT 407 (H) 12/02/2017 1651   MCV 85.5 12/02/2017 1651   MCH 28.6 12/02/2017 1651   MCHC 33.4 12/02/2017 1651   RDW 12.6 12/02/2017 1651   LYMPHSABS 2,673 12/02/2017 1651   MONOABS 0.8 10/19/2015 1327   EOSABS 113 12/02/2017 1651   BASOSABS 57 12/02/2017 1651   Iron/TIBC/Ferritin/ %Sat    Component Value Date/Time   IRON 55 05/08/2013 1622   TIBC 357 04/14/2012 1059   IRONPCTSAT 57 (H) 04/14/2012 1059   Lipid Panel     Component Value Date/Time   CHOL 188 06/08/2017 0000   TRIG 110 06/08/2017 0000   HDL 67 06/08/2017 0000   CHOLHDL 2.8 06/08/2017 0000   VLDL 10 03/05/2017 1903   LDLCALC 100 (H) 06/08/2017 0000   Hepatic Function Panel     Component Value Date/Time   PROT 6.3 12/02/2017 1651   ALBUMIN 4.1 05/27/2016 0828   AST 13 12/02/2017 1651   ALT 13 12/02/2017 1651   ALKPHOS 85 05/27/2016 0828   BILITOT 0.3 12/02/2017 1651   BILIDIR 0.1 12/02/2017 1651   IBILI 0.2 12/02/2017 1651      Component Value Date/Time   TSH 1.80 06/08/2017 0000   TSH 0.938 03/05/2017 1903   TSH 1.54 11/17/2016 0941   TSH 2.62 05/27/2016 0828     Ref. Range 11/01/2017 00:00  Vitamin D, 25-Hydroxy Latest Ref Range: 30 - 100 ng/mL 64    I, Doreene Nest, am acting as Location manager for [employer], FNP-C.  I have reviewed the above documentation for accuracy and completeness, and I agree with the above.  - Addysin Porco, FNP-C.

## 2018-12-05 ENCOUNTER — Ambulatory Visit (INDEPENDENT_AMBULATORY_CARE_PROVIDER_SITE_OTHER): Payer: No Typology Code available for payment source | Admitting: "Endocrinology

## 2018-12-05 ENCOUNTER — Ambulatory Visit
Admission: RE | Admit: 2018-12-05 | Discharge: 2018-12-05 | Disposition: A | Discharge status: Home / Self Care | Payer: No Typology Code available for payment source | Source: Ambulatory Visit | Attending: Internal Medicine | Admitting: Internal Medicine

## 2018-12-05 ENCOUNTER — Other Ambulatory Visit: Payer: Self-pay

## 2018-12-05 DIAGNOSIS — Z1231 Encounter for screening mammogram for malignant neoplasm of breast: Secondary | ICD-10-CM

## 2018-12-13 MED FILL — LEVOTHYROXINE 25 MCG TABLET: 25 | 90 days supply | Qty: 90 | Fill #0

## 2018-12-13 MED FILL — metFORMIN HCL 1000 MG TABS: 1000 | 90 days supply | Qty: 180 | Fill #0

## 2018-12-13 MED FILL — VICTOZA 18 MG/3 ML INJECT P: 18 | 90 days supply | Qty: 27 | Fill #0

## 2018-12-13 MED FILL — ESCITALOPRAM 20 MG TABLET: 20 | 90 days supply | Qty: 90 | Fill #0

## 2018-12-13 MED FILL — ALPRAZolam 0.5 MG TABS: 0.5 | 30 days supply | Qty: 60 | Fill #3

## 2018-12-13 MED FILL — PANTOPRAZOLE SOD DR 40 MG T: 40 | 90 days supply | Qty: 90 | Fill #0

## 2018-12-15 ENCOUNTER — Telehealth (INDEPENDENT_AMBULATORY_CARE_PROVIDER_SITE_OTHER): Payer: No Typology Code available for payment source | Admitting: Family Medicine

## 2018-12-15 ENCOUNTER — Encounter (INDEPENDENT_AMBULATORY_CARE_PROVIDER_SITE_OTHER): Payer: Self-pay | Admitting: Family Medicine

## 2018-12-15 ENCOUNTER — Other Ambulatory Visit: Payer: Self-pay

## 2018-12-15 DIAGNOSIS — E669 Obesity, unspecified: Secondary | ICD-10-CM

## 2018-12-15 DIAGNOSIS — Z6831 Body mass index (BMI) 31.0-31.9, adult: Secondary | ICD-10-CM | POA: Diagnosis not present

## 2018-12-15 DIAGNOSIS — E1140 Type 2 diabetes mellitus with diabetic neuropathy, unspecified: Secondary | ICD-10-CM | POA: Diagnosis not present

## 2018-12-16 ENCOUNTER — Other Ambulatory Visit: Payer: No Typology Code available for payment source

## 2018-12-16 ENCOUNTER — Encounter: Payer: No Typology Code available for payment source | Admitting: Internal Medicine

## 2018-12-19 NOTE — Progress Notes (Signed)
Office: [phone]  /  Fax: [phone] TeleHealth Visit:  Alexa [name] has verbally consented to this TeleHealth visit today. The patient is located at the [facility], the provider is located at the [employer] [facility] office. The participants in this visit include the listed provider and patient and any and all parties involved. The visit was conducted today via telephone. Alexa [name] was unable to use realtime audiovisual technology today and the telehealth visit was conducted via telephone (time spent on call 17 minutes, 11:44-12:01).  HPI:   Chief Complaint: OBESITY Alexa [name] is here to discuss her progress with her obesity treatment plan. She is on the portion control better and make smarter food choices plan 85 grams of protein plan and she is following her eating plan approximately 60 to 80 % of the time. She states she is exercising 0 minutes 0 times per week. Alexa [name] is averaging 85 grams of protein, 60% of the time. She has reduced her carb's overall. Alexa [name] has had candy a few times but she is not keeping it in the house. She has decreased her regular soda intake. Her weight is stable around 178 to 179 pounds. Her husband will be starting with our program after the first of the year and she feels this will help her to adhere to the plan better.  We were unable to weigh the patient today for this TeleHealth visit. She feels as if she has maintained weight since her last visit (weight 178 lbs 12/14/18). She has lost 10 lbs since starting treatment with [name].  Diabetes II non-insulin with neuropathy Alexa [name] has a diagnosis of diabetes type II. Diabat4es is not well controlled. Her last A1c was at 8.2 on 09/08/18. Her diabetes is managed by endocrinology. She is currently on Victoza (1.8 mg daily) and metformin (1,000 mg BID). She has a history of non-compliance with medications. She has missed a few doses of metformin recently because she has forgotten to take it. She has been working on  intensive lifestyle modifications including diet, exercise, and weight loss to help control her blood glucose levels.  ASSESSMENT AND PLAN:  Type 2 diabetes mellitus with diabetic neuropathy, without long-term current use of insulin (HCC)  Class 1 obesity with serious comorbidity and body mass index (BMI) of 31.0 to 31.9 in adult, unspecified obesity type  PLAN:  Diabetes II non-insulin with neuropathy Alexa [name] has been given extensive diabetes education by myself today including ideal fasting and post-prandial blood glucose readings, individual ideal Hgb A1c goals and hypoglycemia prevention. We discussed the importance of good blood sugar control to decrease the likelihood of diabetic complications such as nephropathy, neuropathy, limb loss, blindness, coronary artery disease, and death. We discussed the importance of intensive lifestyle modification including diet, exercise and weight loss as the first line treatment for diabetes. Alexa [name] will continue victoza and metformin, and she will work on increasing compliance. Alexa [name] will follow up with endocrinology in January. Alexa [name] will follow up with our clinic in 3 weeks.  Obesity Alexa [name] is currently in the action stage of change. As such, her goal is to continue with weight loss efforts She has agreed to portion control better and make smarter food choices, such as increase vegetables and decrease simple carbohydrates  Alexa [name] has been instructed to work up to a goal of 150 minutes of combined cardio and strengthening exercise per week for weight loss and overall health benefits. We discussed the following Behavioral Modification Strategies today: planning for success, increasing lean protein intake, decreasing simple  carbohydrates and holiday eating strategies    Alexa [name] has agreed to follow up with our clinic in 3 weeks. She was informed of the importance of frequent follow up visits to maximize her success with intensive lifestyle modifications  for her multiple health conditions.  ALLERGIES: Allergies  Allergen Reactions   Theophyllines Palpitations    MEDICATIONS: Current Outpatient Medications on File Prior to Visit  Medication Sig Dispense Refill   albuterol (PROVENTIL HFA;VENTOLIN HFA) 108 (90 Base) MCG/ACT inhaler Inhale 2 puffs into the lungs every 6 (six) hours as needed for wheezing or shortness of breath. 1 Inhaler 11   albuterol (PROVENTIL) (2.5 MG/3ML) 0.083% nebulizer solution Take 3 mLs (2.5 mg total) by nebulization every 6 (six) hours as needed for wheezing or shortness of breath. 150 mL 1   ALPRAZolam (XANAX) 0.5 MG tablet One half to one tab po twice daily prn anxiety 60 tablet 5   Blood Glucose Monitoring Suppl (FREESTYLE LITE) DEVI Use to check glucose 3x daily 2 each 5   CALCIUM-VITAMIN D PO Take 800 mg by mouth 2 (two) times daily with a meal.      conjugated estrogens (PREMARIN) vaginal cream Place 1 Applicatorful vaginally at bedtime. Use for 21 days then off for 7 days     COREG 3.125 MG tablet Take 1 tablet (3.125 mg total) by mouth 2 (two) times daily with a meal. 60 tablet 11   Cyanocobalamin (VITAMIN B 12) 100 MCG LOZG Take by mouth.     cyclobenzaprine (FLEXERIL) 10 MG tablet Take 1 tablet (10 mg total) by mouth 3 (three) times daily as needed for muscle spasms. 90 tablet 0   escitalopram (LEXAPRO) 20 MG tablet Take 1 tablet (20 mg total) by mouth daily. 90 tablet 1   Ferrous Sulfate (SLOW FE PO) Take 1 tablet by mouth daily.      glucose blood (FREESTYLE LITE) test strip Use to check glucose 3x daily 300 each 1   ibuprofen (ADVIL,MOTRIN) 200 MG tablet Take 600 mg by mouth daily as needed for headache or moderate pain.     levothyroxine (SYNTHROID) 25 MCG tablet TAKE 1 TABLET BY MOUTH ONCE DAILY 90 tablet 1   metFORMIN (GLUCOPHAGE) 1000 MG tablet TAKE 1 TABLET BY MOUTH 2 TIMES DAILY WITH A MEAL. 180 tablet 1   Multiple Vitamin (MULTIVITAMIN) tablet Take 1 tablet by mouth daily.  Reported on 02/26/2015 30 tablet 0   Naltrexone-buPROPion HCl ER 8-90 MG TB12 Take by mouth.     ondansetron (ZOFRAN) 4 MG tablet Take 1 tablet (4 mg total) by mouth every 8 (eight) hours as needed for nausea or vomiting. 20 tablet 0   pantoprazole (PROTONIX) 40 MG tablet Take 1 tablet (40 mg total) by mouth daily. 90 tablet 4   telmisartan (MICARDIS) 40 MG tablet Take 0.5 tablets (20 mg total) by mouth 2 (two) times daily. 90 tablet 3   traMADol (ULTRAM) 50 MG tablet Take 1 tablet (50 mg total) by mouth daily as needed for moderate pain or severe pain. 30 tablet 1   VICTOZA 18 MG/3ML SOPN INJECT 1.8 MG DAILY AS DIRECTED 27 mL 1   Vitamin D, Ergocalciferol, (DRISDOL) 50000 units CAPS capsule Take 50,000 Units by mouth every Sunday.     No current facility-administered medications on file prior to visit.     PAST MEDICAL HISTORY: Past Medical History:  Diagnosis Date   Anemia, iron deficiency    Anhedonia    Anxiety    Asthma  Asthma, chronic    Back pain    CAD (coronary artery disease)    Chewing difficulty    Chronic insomnia    Combined hyperlipidemia    Constipation    Depression    Diabetes mellitus    Diabetes mellitus type II    Diabetic autonomic neuropathy (HCC)    Difficulty swallowing pills    DM type 2 with diabetic peripheral neuropathy (HCC)    Dry mouth    Dyspepsia    Dyspnea    Elevated homocysteine    Essential tremor    Fatigue    Fatty liver    GERD (gastroesophageal reflux disease)    GERD (gastroesophageal reflux disease)    Glossitis    Goiter    Hemorrhoids    HLD (hyperlipidemia)    Hyperparathyroidism , secondary, non-renal (HCC)    Hypertension    Hypocalcemia    Insomnia    Leg edema    Leg pain    Macular degeneration    Nonproliferative diabetic retinopathy associated with type 2 diabetes mellitus (HCC)    RESOLVED   Obesity, Class III, BMI 40-49.9 (morbid obesity) ([place])    Osteopenia     Osteoporosis    Pallor    Status post gastric surgery    Tachycardia    Thyroiditis, autoimmune    Vaginal dryness, menopausal    Vitamin D deficiency     PAST SURGICAL HISTORY: Past Surgical History:  Procedure Laterality Date   BREAST REDUCTION SURGERY Bilateral 12/14/2017   Procedure: BILATERAL MAMMARY REDUCTION  (BREAST);  Surgeon: Irene Limbo, MD;  Location: [facility];  Service: Plastics;  Laterality: Bilateral;   CARDIAC CATHETERIZATION     x 2   REDUCTION MAMMAPLASTY  11/2017   ROUX-EN-Y PROCEDURE     TONSILLECTOMY      SOCIAL HISTORY: Social History   Tobacco Use   Smoking status: Never Smoker   Smokeless tobacco: Never Used  Substance Use Topics   Alcohol use: No    Alcohol/week: 0.0 standard drinks   Drug use: No    FAMILY HISTORY: Family History  Problem Relation Age of Onset   Thyroid disease Mother        Hypothyroid   Depression Mother    Liver disease Mother    Obesity Mother    Diabetes Father        T2 DM   Hypertension Father    Hyperlipidemia Father    Heart disease Father    Obesity Brother        250 pounds at a height of 76 inches.   Thyroid disease Sister        Hypothyroid   Diabetes Maternal Grandmother    Colon cancer Other        great grandfather   Liver disease Maternal Aunt        NASH   Esophageal cancer Neg Hx    Pancreatic cancer Neg Hx    Kidney disease Neg Hx     ROS: Review of Systems  Constitutional: Negative for weight loss.    PHYSICAL EXAM: Pt in no acute distress  RECENT LABS AND TESTS: BMET    Component Value Date/Time   NA 134 (L) 12/02/2017 1100   K 4.1 12/02/2017 1100   CL 103 12/02/2017 1100   CO2 20 (L) 12/02/2017 1100   GLUCOSE 322 (H) 12/02/2017 1100   BUN 20 12/02/2017 1100   CREATININE 1.17 (H) 12/02/2017 1100   CREATININE 1.27 (H)  06/08/2017 0000   CALCIUM 8.9 12/02/2017 1100   CALCIUM 9.3 08/14/2011 1217   GFRNONAA 51 (L)  12/02/2017 1100   GFRNONAA 67 11/17/2016 0941   GFRAA 59 (L) 12/02/2017 1100   GFRAA 78 11/17/2016 0941   Lab Results  Component Value Date   HGBA1C 8.2 (A) 09/08/2018   HGBA1C 7.7 (A) 03/24/2018   HGBA1C 6.9 (A) 11/01/2017   HGBA1C 10.0 04/12/2017   HGBA1C 13.4 (H) 11/17/2016   Lab Results  Component Value Date   INSULIN 8.1 11/16/2017   CBC    Component Value Date/Time   WBC 8.1 12/02/2017 1651   RBC 4.13 12/02/2017 1651   HGB 11.8 12/02/2017 1651   HCT 35.3 12/02/2017 1651   PLT 407 (H) 12/02/2017 1651   MCV 85.5 12/02/2017 1651   MCH 28.6 12/02/2017 1651   MCHC 33.4 12/02/2017 1651   RDW 12.6 12/02/2017 1651   LYMPHSABS 2,673 12/02/2017 1651   MONOABS 0.8 10/19/2015 1327   EOSABS 113 12/02/2017 1651   BASOSABS 57 12/02/2017 1651   Iron/TIBC/Ferritin/ %Sat    Component Value Date/Time   IRON 55 05/08/2013 1622   TIBC 357 04/14/2012 1059   IRONPCTSAT 57 (H) 04/14/2012 1059   Lipid Panel     Component Value Date/Time   CHOL 188 06/08/2017 0000   TRIG 110 06/08/2017 0000   HDL 67 06/08/2017 0000   CHOLHDL 2.8 06/08/2017 0000   VLDL 10 03/05/2017 1903   LDLCALC 100 (H) 06/08/2017 0000   Hepatic Function Panel     Component Value Date/Time   PROT 6.3 12/02/2017 1651   ALBUMIN 4.1 05/27/2016 0828   AST 13 12/02/2017 1651   ALT 13 12/02/2017 1651   ALKPHOS 85 05/27/2016 0828   BILITOT 0.3 12/02/2017 1651   BILIDIR 0.1 12/02/2017 1651   IBILI 0.2 12/02/2017 1651      Component Value Date/Time   TSH 1.80 06/08/2017 0000   TSH 0.938 03/05/2017 1903   TSH 1.54 11/17/2016 0941   TSH 2.62 05/27/2016 0828     Ref. Range 11/01/2017 00:00  Vitamin D, 25-Hydroxy Latest Ref Range: 30 - 100 ng/mL 64    I, Doreene Nest, am acting as Location manager for [employer], FNP-C.  I have reviewed the above documentation for accuracy and completeness, and I agree with the above.  - Kynslee Baham, FNP-C.

## 2018-12-20 ENCOUNTER — Telehealth (INDEPENDENT_AMBULATORY_CARE_PROVIDER_SITE_OTHER): Payer: No Typology Code available for payment source | Admitting: Family Medicine

## 2019-01-10 MED FILL — CARVEDILOL 3.125 MG TABLET: 3.125 | 90 days supply | Qty: 180 | Fill #1

## 2019-01-10 MED FILL — ALPRAZolam 0.5 MG TABS: 0.5 | 30 days supply | Qty: 60 | Fill #4

## 2019-01-12 ENCOUNTER — Other Ambulatory Visit: Payer: Self-pay

## 2019-01-12 ENCOUNTER — Telehealth (INDEPENDENT_AMBULATORY_CARE_PROVIDER_SITE_OTHER): Payer: No Typology Code available for payment source | Admitting: Family Medicine

## 2019-01-12 ENCOUNTER — Encounter (INDEPENDENT_AMBULATORY_CARE_PROVIDER_SITE_OTHER): Payer: Self-pay | Admitting: Family Medicine

## 2019-01-12 DIAGNOSIS — Z6831 Body mass index (BMI) 31.0-31.9, adult: Secondary | ICD-10-CM | POA: Diagnosis not present

## 2019-01-12 DIAGNOSIS — E669 Obesity, unspecified: Secondary | ICD-10-CM | POA: Diagnosis not present

## 2019-01-12 DIAGNOSIS — E1142 Type 2 diabetes mellitus with diabetic polyneuropathy: Secondary | ICD-10-CM | POA: Diagnosis not present

## 2019-01-13 ENCOUNTER — Other Ambulatory Visit: Payer: No Typology Code available for payment source

## 2019-01-14 ENCOUNTER — Other Ambulatory Visit: Payer: Self-pay

## 2019-01-14 ENCOUNTER — Ambulatory Visit (INDEPENDENT_AMBULATORY_CARE_PROVIDER_SITE_OTHER): Payer: Self-pay | Admitting: *Deleted

## 2019-01-14 DIAGNOSIS — B351 Tinea unguium: Secondary | ICD-10-CM

## 2019-01-14 NOTE — Progress Notes (Signed)
Patient presents today for laser treatment. She has had 7 treatments already and taken 120 days of lamisil over the course of a year and is frustrated her hallux nails bilateral are still showing signs of nail fungus. The nails are thick and discolored at the distal ends. She has expected with the treatments she has done so far the nails would have cleared by now.   Hallux nails bilateral were filed down, but no laser treatment was administered today.  She will continue her topical antifungal daily.  She will follow up with Dr. Jacqualyn Posey in about 3-4 weeks to re-evaluate her toenails and discuss further nail treatments.

## 2019-01-16 ENCOUNTER — Telehealth: Payer: Self-pay | Admitting: *Deleted

## 2019-01-16 ENCOUNTER — Ambulatory Visit: Payer: No Typology Code available for payment source | Admitting: Podiatry

## 2019-01-16 ENCOUNTER — Other Ambulatory Visit: Payer: Self-pay

## 2019-01-16 DIAGNOSIS — B351 Tinea unguium: Secondary | ICD-10-CM

## 2019-01-16 DIAGNOSIS — L819 Disorder of pigmentation, unspecified: Secondary | ICD-10-CM

## 2019-01-16 NOTE — Progress Notes (Signed)
Office: [phone]  /  Fax: [phone] TeleHealth Visit:  Alexa [name] has verbally consented to this TeleHealth visit today. The patient is located at home, the provider is located at the [facility] office. The participants in this visit include the listed provider and patient and any and all parties involved. The visit was conducted today via telephone. Kum was unable to use realtime audiovisual technology today and the telehealth visit was conducted via telephone (time spent on call 22 minutes, 9:16-9:38).  HPI:  Chief Complaint: OBESITY Alexa [name] is here to discuss her progress with her obesity treatment plan. She is on the portion control better and make smarter food choices plan with 85 grams of protein daily and states she is following her eating plan approximately 60 % of the time. She states she is exercising 0 minutes 0 times per week.  Alexa [name] has reduced her soda intake. She had been drinking 1 to 2 small cans of coke daily but she has not had any in the last week. She does not like artificial sweeteners so diet drinks are not an option. Alexa [name] is trying to eat protein first in her meals. She weighs 174 pounds today. Alexa [name] is on Contrave 1 pill in the morning and she does feel that this helps with polyphagia. Her husband starts [facility] program January 5th. She feels this will help her to make better food choices.  Diabetes II with diabetic neuropathy, non insulin Alexa [name] has a diagnosis of diabetes type II. Her last A1c was at 8.2. Her diabetes is not well controlled. She reports she is hit and miss with Victoza and Metformin compliance and this has been a long standing problem. She has told me in the past that poor adherence to medications is due to deep seated psychological issues. Her diabetes is managed by endocrinology (Dr. Tobe Sos) and she has a follow up scheduled for 02/02/19.   ASSESSMENT AND PLAN:  Type 2 diabetes mellitus with peripheral neuropathy  (HCC)  Class 1 obesity with serious comorbidity and body mass index (BMI) of 31.0 to 31.9 in adult, unspecified obesity type  PLAN:  Diabetes II with diabetic neuropathy, non insulin Alexa [name] has been given diabetes education by myself today. 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  I encouraged better compliance with medications and reduction of simple carbohydrates.  Obesity Alexa [name] is currently in the action stage of change. As such, her goal is to continue with weight loss efforts She has agreed to portion control better and make smarter food choices, such as increase vegetables and decrease simple carbohydrates with a goal 85 grams of protein daily Alexa [name] has been instructed to work up to a goal of 150 minutes of combined cardio and strengthening exercise per week for weight loss and overall health benefits. We discussed the following Behavioral Modification Strategies today: planning for success, keep a strict food journal, increase H2O intake, increasing lean protein intake, decreasing simple carbohydrates , holiday eating strategies  and decrease liquid calories We discussed that she should start tracking protein intake with [employer]. She will continue Contrave 1 pill qAM.  Alexa [name] has agreed to follow up with our clinic in 4 weeks. She was informed of the importance of frequent follow up visits to maximize her success with intensive lifestyle modifications for her multiple health conditions.  ALLERGIES: Allergies  Allergen Reactions  . Theophyllines Palpitations    MEDICATIONS: Current Outpatient Medications on File Prior to Visit  Medication  Sig Dispense Refill  . albuterol (PROVENTIL HFA;VENTOLIN HFA) 108 (90 Base) MCG/ACT inhaler Inhale 2 puffs into the lungs every 6 (six) hours as needed for wheezing or shortness of breath. 1 Inhaler 11  . albuterol (PROVENTIL) (2.5 MG/3ML) 0.083% nebulizer solution Take 3 mLs (2.5 mg total)  by nebulization every 6 (six) hours as needed for wheezing or shortness of breath. 150 mL 1  . ALPRAZolam (XANAX) 0.5 MG tablet One half to one tab po twice daily prn anxiety 60 tablet 5  . Blood Glucose Monitoring Suppl (FREESTYLE LITE) DEVI Use to check glucose 3x daily 2 each 5  . CALCIUM-VITAMIN D PO Take 800 mg by mouth 2 (two) times daily with a meal.     . conjugated estrogens (PREMARIN) vaginal cream Place 1 Applicatorful vaginally at bedtime. Use for 21 days then off for 7 days    . COREG 3.125 MG tablet Take 1 tablet (3.125 mg total) by mouth 2 (two) times daily with a meal. 60 tablet 11  . Cyanocobalamin (VITAMIN B 12) 100 MCG LOZG Take by mouth.    . cyclobenzaprine (FLEXERIL) 10 MG tablet Take 1 tablet (10 mg total) by mouth 3 (three) times daily as needed for muscle spasms. 90 tablet 0  . escitalopram (LEXAPRO) 20 MG tablet Take 1 tablet (20 mg total) by mouth daily. 90 tablet 1  . Ferrous Sulfate (SLOW FE PO) Take 1 tablet by mouth daily.     Marland Kitchen glucose blood (FREESTYLE LITE) test strip Use to check glucose 3x daily 300 each 1  . ibuprofen (ADVIL,MOTRIN) 200 MG tablet Take 600 mg by mouth daily as needed for headache or moderate pain.    Marland Kitchen levothyroxine (SYNTHROID) 25 MCG tablet TAKE 1 TABLET BY MOUTH ONCE DAILY 90 tablet 1  . metFORMIN (GLUCOPHAGE) 1000 MG tablet TAKE 1 TABLET BY MOUTH 2 TIMES DAILY WITH A MEAL. 180 tablet 1  . Multiple Vitamin (MULTIVITAMIN) tablet Take 1 tablet by mouth daily. Reported on 02/26/2015 30 tablet 0  . Naltrexone-buPROPion HCl ER 8-90 MG TB12 Take by mouth.    . ondansetron (ZOFRAN) 4 MG tablet Take 1 tablet (4 mg total) by mouth every 8 (eight) hours as needed for nausea or vomiting. 20 tablet 0  . pantoprazole (PROTONIX) 40 MG tablet Take 1 tablet (40 mg total) by mouth daily. 90 tablet 4  . telmisartan (MICARDIS) 40 MG tablet Take 0.5 tablets (20 mg total) by mouth 2 (two) times daily. 90 tablet 3  . traMADol (ULTRAM) 50 MG tablet Take 1 tablet (50  mg total) by mouth daily as needed for moderate pain or severe pain. 30 tablet 1  . VICTOZA 18 MG/3ML SOPN INJECT 1.8 MG DAILY AS DIRECTED 27 mL 1  . Vitamin D, Ergocalciferol, (DRISDOL) 50000 units CAPS capsule Take 50,000 Units by mouth every Sunday.     No current facility-administered medications on file prior to visit.    PAST MEDICAL HISTORY: Past Medical History:  Diagnosis Date  . Anemia, iron deficiency   . Anhedonia   . Anxiety   . Asthma   . Asthma, chronic   . Back pain   . CAD (coronary artery disease)   . Chewing difficulty   . Chronic insomnia   . Combined hyperlipidemia   . Constipation   . Depression   . Diabetes mellitus   . Diabetes mellitus type II   . Diabetic autonomic neuropathy ([place])   . Difficulty swallowing pills   . DM type 2  with diabetic peripheral neuropathy ([place])   . Dry mouth   . Dyspepsia   . Dyspnea   . Elevated homocysteine   . Essential tremor   . Fatigue   . Fatty liver   . GERD (gastroesophageal reflux disease)   . GERD (gastroesophageal reflux disease)   . Glossitis   . Goiter   . Hemorrhoids   . HLD (hyperlipidemia)   . Hyperparathyroidism , secondary, non-renal ([place])   . Hypertension   . Hypocalcemia   . Insomnia   . Leg edema   . Leg pain   . Macular degeneration   . Nonproliferative diabetic retinopathy associated with type 2 diabetes mellitus ([place])    RESOLVED  . Obesity, Class III, BMI 40-49.9 (morbid obesity) ([place])   . Osteopenia   . Osteoporosis   . Pallor   . Status post gastric surgery   . Tachycardia   . Thyroiditis, autoimmune   . Vaginal dryness, menopausal   . Vitamin D deficiency     PAST SURGICAL HISTORY: Past Surgical History:  Procedure Laterality Date  . BREAST REDUCTION SURGERY Bilateral 12/14/2017   Procedure: BILATERAL MAMMARY REDUCTION  (BREAST);  Surgeon: Irene Limbo, MD;  Location: [facility];  Service: Plastics;  Laterality: Bilateral;  . CARDIAC CATHETERIZATION      x 2  . REDUCTION MAMMAPLASTY  11/2017  . ROUX-EN-Y PROCEDURE    . TONSILLECTOMY      SOCIAL HISTORY: Social History   Tobacco Use  . Smoking status: Never Smoker  . Smokeless tobacco: Never Used  Substance Use Topics  . Alcohol use: No    Alcohol/week: 0.0 standard drinks  . Drug use: No    FAMILY HISTORY: Family History  Problem Relation Age of Onset  . Thyroid disease Mother        Hypothyroid  . Depression Mother   . Liver disease Mother   . Obesity Mother   . Diabetes Father        T2 DM  . Hypertension Father   . Hyperlipidemia Father   . Heart disease Father   . Obesity Brother        250 pounds at a height of 76 inches.  . Thyroid disease Sister        Hypothyroid  . Diabetes Maternal Grandmother   . Colon cancer Other        great grandfather  . Liver disease Maternal Aunt        NASH  . Esophageal cancer Neg Hx   . Pancreatic cancer Neg Hx   . Kidney disease Neg Hx     ROS: Review of Systems  Constitutional: Positive for weight loss.    PHYSICAL EXAM: Last menstrual period 05/11/2010. There is no height or weight on file to calculate BMI. Physical Exam Vitals reviewed.  Constitutional:      General: She is not in acute distress.    Appearance: Normal appearance. She is well-developed. She is obese.  Cardiovascular:     Rate and Rhythm: Normal rate.  Pulmonary:     Effort: Pulmonary effort is normal.  Musculoskeletal:        General: Normal range of motion.  Skin:    General: Skin is warm and dry.  Neurological:     Mental Status: She is alert and oriented to person, place, and time.  Psychiatric:        Mood and Affect: Mood normal.        Behavior: Behavior normal.  RECENT LABS AND TESTS: BMET    Component Value Date/Time   NA 134 (L) 12/02/2017 1100   K 4.1 12/02/2017 1100   CL 103 12/02/2017 1100   CO2 20 (L) 12/02/2017 1100   GLUCOSE 322 (H) 12/02/2017 1100   BUN 20 12/02/2017 1100   CREATININE 1.17 (H) 12/02/2017  1100   CREATININE 1.27 (H) 06/08/2017 0000   CALCIUM 8.9 12/02/2017 1100   CALCIUM 9.3 08/14/2011 1217   GFRNONAA 51 (L) 12/02/2017 1100   GFRNONAA 67 11/17/2016 0941   GFRAA 59 (L) 12/02/2017 1100   GFRAA 78 11/17/2016 0941   Lab Results  Component Value Date   HGBA1C 8.2 (A) 09/08/2018   HGBA1C 7.7 (A) 03/24/2018   HGBA1C 6.9 (A) 11/01/2017   HGBA1C 10.0 04/12/2017   HGBA1C 13.4 (H) 11/17/2016   Lab Results  Component Value Date   INSULIN 8.1 11/16/2017   CBC    Component Value Date/Time   WBC 8.1 12/02/2017 1651   RBC 4.13 12/02/2017 1651   HGB 11.8 12/02/2017 1651   HCT 35.3 12/02/2017 1651   PLT 407 (H) 12/02/2017 1651   MCV 85.5 12/02/2017 1651   MCH 28.6 12/02/2017 1651   MCHC 33.4 12/02/2017 1651   RDW 12.6 12/02/2017 1651   LYMPHSABS 2,673 12/02/2017 1651   MONOABS 0.8 10/19/2015 1327   EOSABS 113 12/02/2017 1651   BASOSABS 57 12/02/2017 1651   Iron/TIBC/Ferritin/ %Sat    Component Value Date/Time   IRON 55 05/08/2013 1622   TIBC 357 04/14/2012 1059   IRONPCTSAT 57 (H) 04/14/2012 1059   Lipid Panel     Component Value Date/Time   CHOL 188 06/08/2017 0000   TRIG 110 06/08/2017 0000   HDL 67 06/08/2017 0000   CHOLHDL 2.8 06/08/2017 0000   VLDL 10 03/05/2017 1903   LDLCALC 100 (H) 06/08/2017 0000   Hepatic Function Panel     Component Value Date/Time   PROT 6.3 12/02/2017 1651   ALBUMIN 4.1 05/27/2016 0828   AST 13 12/02/2017 1651   ALT 13 12/02/2017 1651   ALKPHOS 85 05/27/2016 0828   BILITOT 0.3 12/02/2017 1651   BILIDIR 0.1 12/02/2017 1651   IBILI 0.2 12/02/2017 1651      Component Value Date/Time   TSH 1.80 06/08/2017 0000   TSH 0.938 03/05/2017 1903   TSH 1.54 11/17/2016 0941   TSH 2.62 05/27/2016 0828     I, Doreene Nest, am acting as transcriptionist for [employer], FNP-C  I have reviewed the above documentation for accuracy and completeness, and I agree with the above.  - Klani Caridi, FNP-C.

## 2019-01-16 NOTE — Patient Instructions (Signed)

## 2019-01-16 NOTE — Telephone Encounter (Signed)
The shipment went out today and the tracking number is [id number] and the fedex tracking number [id number] and is going out today from our office at 5:37 pm. Alexa [name]

## 2019-01-17 ENCOUNTER — Ambulatory Visit: Payer: No Typology Code available for payment source | Admitting: Podiatry

## 2019-01-22 NOTE — Progress Notes (Signed)
Subjective: 58 year old female presents the office today for concerns of a small black line present within her left big toenail.  She states that she came in on Saturday for nail treatment with the laser therapy and upon debridement the nail then noticed small line of the nail.  The patient rather have this area evaluated.  She had not noticed this prior to Saturday.  She denies any pain to the nail denies any redness or drainage.  Also had multiple nail fungus treatments and overall the nail still thickened discolored. Denies any systemic complaints such as fevers, chills, nausea, vomiting. No acute changes since last appointment, and no other complaints at this time.   Objective: AAO x3, NAD DP/PT pulses palpable bilaterally, CRT less than 3 seconds Along the left lateral hallux there is a small linear area of hyperpigmentation of the nail.  This is new and she reports.  There is no edema to the nail is no redness or drainage or any signs of infection.  Overall nails still thick and discolored with yellow-brown discoloration. No pain with calf compression, swelling, warmth, erythema      Assessment: Hyperpigmented lesion left hallux  Plan: -All treatment options discussed with the patient including all alternatives, risks, complications.  -Discussed etiology of this.  Given the part of this this implies given the thickening of the nail.  However we discussed biopsy he also wishes to proceed with this.This is a small area I did discuss the removal of the entire lateral portion of the nail to ensure that we removed this darkened area.  She wishes to proceed consent was obtained.  The skin was prepped with alcohol mixture of 3 cc of lidocaine, Marcaine plain was infiltrated in a hallux block fashion.  The skin was then prepped and tourniquet applied.  Under the lateral one third portion of the toenail noted on nail borders.  This was sent for pathology specifically to evaluate for melanoma.  Upon  removal of the nail there is no extension of any hyperpigmentation into the nailbed itself.  The area was irrigated.  Silvadene was applied followed by dry sterile dressing.  Tourniquet was released and is found to be in immediate capillary fill time to the digit. -Patient encouraged to call the office with any questions, concerns, change in symptoms.

## 2019-01-26 LAB — LIPID PANEL
Cholesterol: 169 mg/dL (ref <200)
HDL: 65 mg/dL (ref >=50)
LDL Cholesterol (Calc): 87 mg/dL (calc)
Non-HDL Cholesterol (Calc): 104 mg/dL (calc) (ref <130)
Total CHOL/HDL Ratio: 2.6 (calc) (ref <5.0)
Triglycerides: 81 mg/dL (ref <150)

## 2019-01-26 LAB — MICROALBUMIN / CREATININE URINE RATIO
Creatinine, Urine: 247 mg/dL (ref 20–275)
Microalb Creat Ratio: 9 mcg/mg creat (ref <30)
Microalb, Ur: 2.1 mg/dL

## 2019-01-26 LAB — COMPREHENSIVE METABOLIC PANEL
AG Ratio: 1.9 (calc) (ref 1.0–2.5)
ALT: 8 U/L (ref 6–29)
AST: 14 U/L (ref 10–35)
Albumin: 4.5 g/dL (ref 3.6–5.1)
Alkaline phosphatase (APISO): 58 U/L (ref 37–153)
BUN/Creatinine Ratio: 16 (calc) (ref 6–22)
BUN: 20 mg/dL (ref 7–25)
CO2: 26 mmol/L (ref 20–32)
Calcium: 9.4 mg/dL (ref 8.6–10.4)
Chloride: 103 mmol/L (ref 98–110)
Creat: 1.25 mg/dL — ABNORMAL HIGH (ref 0.50–1.05)
Globulin: 2.4 g/dL (calc) (ref 1.9–3.7)
Glucose, Bld: 87 mg/dL (ref 65–99)
Potassium: 4.7 mmol/L (ref 3.5–5.3)
Sodium: 139 mmol/L (ref 135–146)
Total Bilirubin: 0.4 mg/dL (ref 0.2–1.2)
Total Protein: 6.9 g/dL (ref 6.1–8.1)

## 2019-01-26 LAB — PTH, INTACT AND CALCIUM
Calcium: 9.4 mg/dL (ref 8.6–10.4)
PTH: 42 pg/mL (ref 14–64)

## 2019-01-26 LAB — T3, FREE: T3, Free: 2.6 pg/mL (ref 2.3–4.2)

## 2019-01-26 LAB — C-PEPTIDE: C-Peptide: 1.27 ng/mL (ref 0.80–3.85)

## 2019-01-26 LAB — TSH: TSH: 1.87 mIU/L (ref 0.40–4.50)

## 2019-01-26 LAB — T4, FREE: Free T4: 1.3 ng/dL (ref 0.8–1.8)

## 2019-02-01 ENCOUNTER — Other Ambulatory Visit: Payer: Self-pay | Admitting: Podiatry

## 2019-02-01 ENCOUNTER — Ambulatory Visit (INDEPENDENT_AMBULATORY_CARE_PROVIDER_SITE_OTHER): Payer: No Typology Code available for payment source | Admitting: "Endocrinology

## 2019-02-01 DIAGNOSIS — Z79899 Other long term (current) drug therapy: Secondary | ICD-10-CM

## 2019-02-01 MED ORDER — TERBINAFINE HCL 250 MG PO TABS: 250.0000 mg | ORAL_TABLET | Freq: Every day | ORAL | 0 refills | Status: DC

## 2019-02-01 MED FILL — TERBINAFINE HCL 250 MG TAB: 250 | 90 days supply | Qty: 90 | Fill #0

## 2019-02-01 NOTE — Progress Notes (Signed)
Subjective:  Patient Name: Alexa [name] Date of Birth: [birth year]  MRN: [id number]  Alexa [name]  presents to the office today for follow-up of her type 2 diabetes mellitus, obesity, combined hyperlipidemia, GERD, hypertension, ASHD, dyspepsia, pedal edema, non-proliferative diabetic retinopathy, goiter, depression, autonomic neuropathy, tachycardia, peripheral neuropathy, acquired hypothyroidism, vitamin D deficiency, hypocalcemia, secondary hyperparathyroidism, glossitis, pallor, fatigue, iron deficiency anemia, anhedonia, disinclination to take medicines, and status post Roux-en-Y gastric bypass.   HISTORY OF PRESENT ILLNESS:   Alexa [name] is a 59 y.o. Caucasian woman. Alexa [name] was unaccompanied.  1. The patient first presented to me on 08/20/04 in referral from her primary care internist, Dr. Emeline General, for evaluation and management of her type 2 diabetes, obesity, and multiple medical issues. She was 59 years old.   [name] had a long history of obesity. She was heavy as a child. She underwent menarche at age 46. At age 72 she was 180 pounds. In 1996 she weighed 218 pounds. In 1999 she was diagnosed with type 2 diabetes mellitus. Her weight at that point was 250 pounds. She was treated with Glucophage, and Actos. Actos made her gain even more weight. She had also been treated with glipizide in the past. More recently she had been treated with Lantus and Glucophage plus regular insulin as needed, especially for when she took steroids for asthma attacks. Maximum weight had been 296 pounds one month prior to that first visit with me. Her tendency to gain weight and her difficulty in losing weight were aggravated by long-standing, intermittent depression and by severe, recurrent asthma requiring the use of steroid medications.  B. Past medical history was also positive for a 60% blockage in one of her coronary arteries. She had significant issues with GERD and dyspepsia. She also had combined  hyperlipidemia. She had a previous cardiac catheterization and previous tonsillectomy. She was allergic to theophylline. Her pertinent review of systems was positive for some numbness and tingling in her feet. Family history was positive for type 2 diabetes in her father and her maternal grandmother. Her brother weighed 250 pounds at a height of 76 inches. Both her mother and her sister were hypothyroid.  C. On physical examination, her weight was 279.9 pounds. Her BMI was 49.6. Her blood pressure was 142/86. Her heart rate was 96. Her hemoglobin A1c was 9.8%. She was alert and oriented x3. Her affect was normal. Her insight was fair. She was obviously quite obese. She had a 25 gm thyroid gland. She had 1+ tremor of her hands. She had 1+ DP pulses and 2+ tinea pedis. Sensation to touch was intact in her feet. Laboratory data included a normal CMP. Her cholesterol was 175, triglycerides 76, HDL 53, and LDL 107. Her TSH was 2.98. Since she obviously did have type 2 diabetes mellitus associated with morbid obesity, and since weight loss was a major factor for her, I asked her to resume her metformin twice daily. I also started her on Byetta, initially 5 mcg twice daily and later 10 mcg twice daily. I discontinued her Lantus insulin.  2. During the last 14 years,we have had some successes, some failures, and some new problem areas.  A. T2DM: In October 2007 the combination of Byetta and metformin was causing more gastrointestinal problems. She opted to stop the metformin and continue the Byetta because it was helping her with weight control and blood sugar control. By 08/11/06 her weight had decreased to 267.6 pounds. Hemoglobin A1c was 8.6%. At that point  she decided to have bariatric surgery. She had a Roux-en-Y gastric bypass on 12/20/2006. She subsequently lost weight down to 173.4 pounds on 05/23/08, but then subsequently regained weight to the 190s. Her  hemoglobin A1c values varied in parallel with her weights.  On 05/23/08, at the point of her lowest weight, her hemoglobin A1c dropped to a nadir of 6.3%.  Since then her hemoglobin A1c values have varied between 6.6 and 13.4%. Although we were initially able to stop all of her diabetes medicines after bariatric surgery, when she began to regain weight we restarted metformin, 500 mg twice daily. Although she took her metformin twice daily for a long time, she had discontinued it many years ago. She hated to take medicines and could not make herself be compliant in taking medications or exercising. When her HbA1c increased to 9.4% in September 2015, I stopped her Byetta injections and started her on liraglutide (Victoza) daily injections on 11/16/13. After several more years of noncompliance, she has been doing much better in the past year.   B. Vitamin D deficiency, hypocalcemia, and secondary hyperparathyroidism: Just before her gastric bypass, I obtained baseline bone mineral metabolism studies. Her 25-hydroxy vitamin D was very low at 7 (normal greater than or equal to 30). Her calcium was 9.5 (normal 8.6-10.6). Her parathyroid hormone was 38.4 (normal 14-72). Subsequent to her surgery, the patient was supposed to be taking multivitamins with calcium and vitamin D, but did not always do so. On 03/29/2007 her 25-hydroxy vitamin D was 29, but her calcium decreased to 8.7. Her PTH was slightly elevated at 73.5. Her 1,25-hydroxy vitamin D was 46. Her iron was 56. I asked her to make sure she took her multivitamins and calcium daily. Unfortunately, she has often been non-compliant with her multivitamins and calcium. Her 25-hydroxy vitamin D values have varied between 15-35. Her calcium values have varied between 8.1-9.4. Her PTH values have remained elevated between 79-167. Alexa [name]'s iron levels have also been low, between 32-34. She was supposed to be taking iron every day, but frequently did not do so. In the past year, however, she has been doing better at taking her vitamin  D, calcium, and iron. In May 2019 her vitamin D was at the lower end of the reference range, her calcium was about the 40% of the reference range, but her PTH was still significantly elevated.    Alexa [name] has had psychological issues for many years that have adversely affected her eating, her unwillingness to exercise, her noncompliance with taking medications, her weight, and her T2DM care. Fortunately, her recent outpatient psychological therapy with Ms. Rea College, RN, MS and her taking Lexapro regularly have resulted in Alexa [name] having a much more positive attitude about life in general and a generally more positive attitude about taking her medications. Fortunately, Alexa [name]'s has been dealing successfully with her issues and her compliance has markedly improved.   D. Obesity: Although Alexa [name] regained a great deal of weight in the years following her bariatric surgery, in the past year she has been taking Contrave and has been able to lose almost all of the weight that she had gained after her bariatric surgery.   3. Alexa [name]'s last [facility] visit was on 09/08/18. At that visit I asked her to continue her morning dose of metformin of 500 mg and continue the evening dose of 1000 mg. She has since increased the doses to 1000 mg, twice daily. I continued her Synthroid dose at bedtime. I asked her to take one  [employer], 50,000 IU capsule of vitamin D weekly and calcium at each meal.   A. She has been healthy. She has been taking all of her medications more consistently. She feels better physically and emotionally.   B. She has had some shakiness of her hands on some mornings which she thinks might have been due to low BGs, but she has not checked her BGs before taking glucose. She has not had any similar reactions in the past week.   C. She has been sleeping better, but still takes Xanax intermittently. She is no longer drinking very regular Coke.   D. She resumed taking Contrave, but only 1 pill, daily. Taking the  Contrave twice daily causes too much nausea.   Alexa [name] remains in counseling with Ms. Showfety every month.    F . She has not had much of a problem with asthma this year.    G. She still has some hot flashes, but "much better".  H. She has still been bothered by leg cramps about once a week. The pains in her left shin still occur at times.    I. She has not been walking much.   J. She is taking Lexapro, 20 mg/day; Synthroid, 25 mcg/day; Micardis, 20 mg, twice daily; Victoza,1.8 mg/day; Protonix, 40 mg/day; metformin 1000 mg on the morning and 1000 mg in the evening. She takes one 800+ mg Citracal tablet thrice daily. She is also taking Coreg, 3.125 mg, twice daily. She is also taking B12 daily. She also takes one Contrave daily for now. She will resume oral fungal treatment soon for her tinea pedis.   K. She is doing pretty well at not allowing herself to become over-committed. She is trying to achieve more balance.    4. Review of Systems: There are no other significant issues  Constitutional: "I have a sense of peace about things.". She still has some anxiety about job and social issues. Marital relations are going very well.   Eyes: Vision has been okay. Her last eye exam occurred in the Fall of 2020. No diabetic eye disease was noted. She has some macular edema in her left eye, but this fining has not changed over time.  Neck: She occasionally has trouble swallowing, but not often. The patient has no complaints of anterior neck swelling, soreness, tenderness, pressure, or discomfort.  Heart: She saw her cardiologist in August 2019. Her follow up visit in March 2020 was cancelled due to covid-19. She needs to call to re-schedule. She occasionally has faster heart rates when she is anxious/stressed at work. She has not had any chest pain or pressure. Heart rate increases with exercise or other physical activity. She has no complaints of palpitations or irregular heat beats Gastrointestinal: She  has more nausea since resuming Contrave. She rarely  has the sensation of stomach pains. She still occasionally has post-prandial bloating if she eats too much. She does not have any excessive head hunger. She sometimes has belly hunger if she does not consume enough protein. She is not having acid reflux, upset stomach, stomach aches or pains, swallowing difficulties, diarrhea, or constipation.  Legs: As above. There are no complaints of numbness, tingling, or burning. No edema is noted. Feet: There are no complaints of numbness, tingling, burning, or pain. No edema is noted. Tinea improved with her new laser therapy.   Neuro: No new sensory or muscular problems Psych: She is doing very well.    GU: She has not been urinating a lot.  She has not had much nocturia.   Hypoglycemia: Possibly some mornings.    5. BG printout: She is not checking BGs.  PAST MEDICAL, FAMILY, AND SOCIAL HISTORY:  Past Medical History:  Diagnosis Date  . Anemia, iron deficiency   . Anhedonia   . Anxiety   . Asthma   . Asthma, chronic   . Back pain   . CAD (coronary artery disease)   . Chewing difficulty   . Chronic insomnia   . Combined hyperlipidemia   . Constipation   . Depression   . Diabetes mellitus   . Diabetes mellitus type II   . Diabetic autonomic neuropathy ([place])   . Difficulty swallowing pills   . DM type 2 with diabetic peripheral neuropathy ([place])   . Dry mouth   . Dyspepsia   . Dyspnea   . Elevated homocysteine   . Essential tremor   . Fatigue   . Fatty liver   . GERD (gastroesophageal reflux disease)   . GERD (gastroesophageal reflux disease)   . Glossitis   . Goiter   . Hemorrhoids   . HLD (hyperlipidemia)   . Hyperparathyroidism , secondary, non-renal ([place])   . Hypertension   . Hypocalcemia   . Insomnia   . Leg edema   . Leg pain   . Macular degeneration   . Nonproliferative diabetic retinopathy associated with type 2 diabetes mellitus ([place])    RESOLVED  . Obesity, Class  III, BMI 40-49.9 (morbid obesity) ([place])   . Osteopenia   . Osteoporosis   . Pallor   . Status post gastric surgery   . Tachycardia   . Thyroiditis, autoimmune   . Vaginal dryness, menopausal   . Vitamin D deficiency     Family History  Problem Relation Age of Onset  . Thyroid disease Mother        Hypothyroid  . Depression Mother   . Liver disease Mother   . Obesity Mother   . Diabetes Father        T2 DM  . Hypertension Father   . Hyperlipidemia Father   . Heart disease Father   . Obesity Brother        250 pounds at a height of 76 inches.  . Thyroid disease Sister        Hypothyroid  . Diabetes Maternal Grandmother   . Colon cancer Other        great grandfather  . Liver disease Maternal Aunt        NASH  . Esophageal cancer Neg Hx   . Pancreatic cancer Neg Hx   . Kidney disease Neg Hx      Current Outpatient Medications:  .  ALPRAZolam (XANAX) 0.5 MG tablet, One half to one tab po twice daily prn anxiety, Disp: 60 tablet, Rfl: 5 .  Blood Glucose Monitoring Suppl (FREESTYLE LITE) DEVI, Use to check glucose 3x daily, Disp: 2 each, Rfl: 5 .  calcium-vitamin D (OSCAL WITH D) 500-200 MG-UNIT TABS tablet, Take by mouth., Disp: , Rfl:  .  conjugated estrogens (PREMARIN) vaginal cream, Place 1 Applicatorful vaginally at bedtime. Use for 21 days then off for 7 days, Disp: , Rfl:  .  COREG 3.125 MG tablet, Take 1 tablet (3.125 mg total) by mouth 2 (two) times daily with a meal., Disp: 60 tablet, Rfl: 11 .  Cyanocobalamin (VITAMIN B 12) 100 MCG LOZG, Take by mouth., Disp: , Rfl:  .  cyclobenzaprine (FLEXERIL) 10 MG tablet, Take 1 tablet (10  mg total) by mouth 3 (three) times daily as needed for muscle spasms., Disp: 90 tablet, Rfl: 0 .  escitalopram (LEXAPRO) 20 MG tablet, Take 1 tablet (20 mg total) by mouth daily., Disp: 90 tablet, Rfl: 1 .  Ferrous Sulfate (SLOW FE PO), Take 1 tablet by mouth daily. , Disp: , Rfl:  .  glucose blood (FREESTYLE LITE) test strip, Use to check  glucose 3x daily, Disp: 300 each, Rfl: 1 .  levothyroxine (SYNTHROID) 25 MCG tablet, TAKE 1 TABLET BY MOUTH ONCE DAILY, Disp: 90 tablet, Rfl: 1 .  metFORMIN (GLUCOPHAGE) 1000 MG tablet, TAKE 1 TABLET BY MOUTH 2 TIMES DAILY WITH A MEAL., Disp: 180 tablet, Rfl: 1 .  Multiple Vitamin (MULTIVITAMIN) tablet, Take 1 tablet by mouth daily. Reported on 02/26/2015, Disp: 30 tablet, Rfl: 0 .  Naltrexone-buPROPion HCl ER 8-90 MG TB12, Take by mouth., Disp: , Rfl:  .  ondansetron (ZOFRAN) 4 MG tablet, Take 1 tablet (4 mg total) by mouth every 8 (eight) hours as needed for nausea or vomiting., Disp: 20 tablet, Rfl: 0 .  pantoprazole (PROTONIX) 40 MG tablet, Take 1 tablet (40 mg total) by mouth daily., Disp: 90 tablet, Rfl: 4 .  telmisartan (MICARDIS) 40 MG tablet, Take 0.5 tablets (20 mg total) by mouth 2 (two) times daily., Disp: 90 tablet, Rfl: 3 .  terbinafine (LAMISIL) 250 MG tablet, Take 1 tablet (250 mg total) by mouth daily., Disp: 90 tablet, Rfl: 0 .  traMADol (ULTRAM) 50 MG tablet, Take 1 tablet (50 mg total) by mouth daily as needed for moderate pain or severe pain., Disp: 30 tablet, Rfl: 1 .  VICTOZA 18 MG/3ML SOPN, INJECT 1.8 MG DAILY AS DIRECTED, Disp: 27 mL, Rfl: 1 .  Vitamin D, Ergocalciferol, (DRISDOL) 50000 units CAPS capsule, Take 50,000 Units by mouth every Sunday., Disp: , Rfl:  .  albuterol (PROVENTIL HFA;VENTOLIN HFA) 108 (90 Base) MCG/ACT inhaler, Inhale 2 puffs into the lungs every 6 (six) hours as needed for wheezing or shortness of breath., Disp: 1 Inhaler, Rfl: 11 .  albuterol (PROVENTIL) (2.5 MG/3ML) 0.083% nebulizer solution, Take 3 mLs (2.5 mg total) by nebulization every 6 (six) hours as needed for wheezing or shortness of breath., Disp: 150 mL, Rfl: 1 .  diphenhydrAMINE (SOMINEX) 25 MG tablet, Take by mouth., Disp: , Rfl:  .  ibuprofen (ADVIL,MOTRIN) 200 MG tablet, Take 600 mg by mouth daily as needed for headache or moderate pain., Disp: , Rfl:   Allergies as of 02/02/2019 -  Review Complete 01/22/2019  Allergen Reaction Noted  . Theophyllines Palpitations 05/13/2010    1. 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 2. Activities: She has not been walking.     3. Smoking, alcohol, or drugs: None 4. Primary Care Provider: Dr. Tommie Ard Baxley 5. Therapist: Ms. Rea College, MS 6. Bariatrician: Dr. Redgie Grayer, MD  REVIEW OF SYSTEM: There are no other significant problems involving Azyah's other body systems.   Objective:  Vital Signs:  BP 108/68   Pulse 92   Wt 172 lb 9.6 oz (78.3 kg)   LMP 05/11/2010   BMI 30.57 kg/m    Ht Readings from Last 3 Encounters:  10/17/18 5\' 3"  (1.6 m)  09/08/18 5\' 3"  (1.6 m)  03/24/18 5\' 3"  (  1.6 m)   Wt Readings from Last 3 Encounters:  02/02/19 172 lb 9.6 oz (78.3 kg)  10/17/18 179 lb 6.4 oz (81.4 kg)  09/08/18 177 lb (80.3 kg)   PHYSICAL EXAM:  Constitutional:  [place] looks good today. She is alert and bright. Her affect and insight are very good. She is wearing a blouse and skirt today, but is not wearing make up or lipstick. She has lost 7 pounds since her last visit. Her weight is now below the post-gastric bypass nadir of 173.4 pounds that it was in 2010. Eyes: There is no arcus or proptosis.  Mouth: The oropharynx appears normal. The tongue appears normal. There is normal oral moisture. There is no obvious gingivitis. Neck: There are no bruits present. The thyroid gland appears normal in size. The thyroid gland is again within normal limits at 18-20 grams in size. The consistency of the thyroid gland is normal. There is no thyroid tenderness to palpation. Lungs: The lungs are clear. Air movement is good. Heart: The heart rhythm and rate appear normal. Heart sounds S1 and S2 are normal. I do not appreciate any  pathologic heart murmurs. Abdomen: The abdomen is more enlarged. Bowel sounds are normal. The abdomen is soft and non-tender. There is no obviously palpable hepatomegaly, splenomegaly, or other masses.  Arms: Muscle mass appears appropriate for age.  Hands: She has a 1+ tremor. Phalangeal and metacarpophalangeal joints appear normal. Palms are normal. Legs: Muscle mass appears appropriate for age. There is no edema.  Feet: There are no significant deformities. Dorsalis pedis pulses are normal 1+ on the right and 1+ on the left. She has visible tinea in her great toenails. Her left great toenail is healing. Neurologic: Muscle strength is normal for age and gender  in both the upper and the lower extremities. Muscle tone appears normal. Sensation to touch is normal in the legs and feet.    LAB DATA:  Labs 02/02/19: HbA1c 7.7%, CBG 101  Labs 01/25/19: TSH 1.87, free T4 1.3, free T3 2.6; C-peptide 1.27 (ref 0.80-3.85); CMP normal except creatinine 1.25; cholesterol 169, triglycerides 81, HDL 65, LDL 87; PTH 42 (ref 14-64), calcium 9.4 (ref 8.6-10.4); urinary microalbumin/creatinine ratio 9 (ref <30)   Labs 09/08/18: HbA1c 8.2%, CBG 123  Labs 03/24/18: HbA1c 7.7%, CBG 103  Labs 01/03/18: CBG 112 fasting  Lbs 12/02/17: sodium 134, glucose 322, creatinine 1.17  labs 11/16/17: Urine microalbumin/creatinine ratio 4.8; B12 282 (ref [phone]), folate 12.7 (ref >3.0)  Labs 11/01/17: HbA1c 6.9%, CBG 189 post-prandially; PTH 108, calcium 8.6, 25-OH vitamin D 64   Labs 09/13/17: Hepatic function panel normal; CBC normal  Labs 08/09/17: Hepatic function panel normal; CBC normal, except Hct 34.7 (35-45)  Labs 06/14/17: CBG 166  Labs 06/08/17: TSH 1.80, free T4 1.3, free T3 2.3; CMP normal except for glucose 115 and creatinine 1.27; PTH 120, calcium 9.7, 25-OH vitamin D 34;cholesterol 188, triglycerides 110, HDL 67, LDL 100; urine microalbumin/creatinine ratio   Labs 04/12/17: HbA1c 10.0%, CBG 174  Labs  03/04/17: Sodium 131, potassium 4.0, chloride 99, CO2 20, glucose 515, calcium 8.9  Labs 01/11/17: CBG 198  Labs 11/24/16: CBG 468; urine glucose 2000+, negative ketones  Labs 11/17/16: HbA1c 13.4%; TSH 1.54; cholesterol 173, triglycerides 91, HDL 71, LDL 83; 25-OH vitamin D 21; CMP normal except for glucose 314; CBC normal except MCHC 26.5 (ref 27-33)   06/04/16: HBA1c 8.1%, CBG 115  05/27/16: TSH 2.62, free T4 1.1, free T3 2.3; CMP normal except for glucose  161, and creatinine 1.09; cholesterol 187, triglycerides 77, HDL 74, LDL 98; urinary microalbumin/creatinine ratio 6; PTH 112, calcium 9.2, 25-OH vitamin D 33  03/17/16: CBG 145  Labs 12/31/15: HbA1c 13.4%, CBG 390; CMP normal except for glucose 344 and creatinine 1.14  Labs 10/08/15: HbA1c 9.6%  Labs 07/02/15: HbA1c 8.9%; PTH 107, calcium 9.2, 25-OH vitamin D 33; CBC normal; CMP normal except for creatinine 1.07; cholesterol 174, triglycerides 91, HDL 74, LDL 82; TSH 2.43, free T4 1.2, free T3 2.3  Labs 02/19/15: PTH 99, calcium 8.7, 25-OH vitamin D 20  Labs 10/08/14: HbA1c 8.0%   Labs 08/06/14: HbA1c 7.4%  Labs 08/01/14: TSH 2.014, free T4 0.87, free T3 2.4; PTH 79 (normal 14-64), calcium 9.1; 25-OH vitamin D 35 CMP normal  Labs 05/11/14: HbA1c was 7.7%, without any low BGs; TSH 2.416, free T4 1.13, free T3 2.3  Labs 02/22/14: HbA1c 11.6%  Labs 02/13/14: Calcium 9.0, PTH 131, 25-OH vitamin D 15; CMP normal except for glucose of 165; cholesterol 177, triglycerides 108, HDL 90, LDL 65; urinary microalbumin/creatinine ratio 15.6; TSH 3.639, free T4 1.30, free T3 2.5  Labs 10/10/13: HbA1c is 9.4%, compared with 8.1% at last visit and with 9.3% in December;   Labs 05/08/13: HbA1c 8.1%; PTH 90.2, calcium 9.5, 25-hydroxy vitamin D 46; CMP normal except for glucose of 215; WBC 8.0, RBC 5.20, Hgb 12.5, Hct 37.8%, MCV 72.7 (normal 78-100), iron 55  Labs 02/23/13: CMP normal, except glucose 194 and alkaline phosphatase 126; microalbumin/creatinine  ratio was 11.5; TSH 2.086, free T4 1.29, free T3 3.2, TPO antibody < 10; 25-hydroxy vitamin D 18; C-peptide 1.79; cholesterol 156, triglycerides 64, HDL 61, LDL 82  Labs 01/05/13: Hemoglobin A1c was 9.3%, compared with 9.3 at last visit and with 9.1% at the visit prior. All of these values were major increases from 6.6% in May 2012.    Labs 04/14/12: 25-hydroxy vitamin D 21, iron 203,  Vitamin B12 379,   Labs 11/16/11: Hgb 10.3, Hct 32.4%; CMP normal except glucose 146' cholesterol 201, triglycerides 70, HDL 66, LDL 121; vitamin D 22           Labs 06/09/10: Cholesterol was 175, triglycerides 76, HDL 62, LDL 98. TSH was 1.217. Free T4 was 1.10. Free T3 was 2.7. 25-hydroxy vitamin D was 19. PTH was 167.   Assessment and Plan:   ASSESSMENT:  1. Type 2 diabetes mellitus:   A. During the past three years her T2DM control has waxed and waned, with HbA1c values varying from 6.9-8.2%.   B. At her last visit her HbA1c had increased to 8.2%. Part of this increase was due to the large amount of Coke she had been drinking, but she had also been taking in more food carbs.   C. At today's visit she has lost 7 pounds due to being more careful with what she has been eating and her HbA1c has decreased in parallel to 7.7%.   D. My goal is to reduce her HbA1c to <6.5% if we can safely do so.   2. Hypoglycemia: She has had some morning shakiness that could be due to hypoglycemia. I asked her to check her BGs when she has those symptoms and call me. We may need to reduce her metformin dosage at dinner.  3. Obesity: Her weight has decreased 7 more pounds since her last visit. She needs to continue to take her medications, continue to Eat Right and exercise daily.    4-5. Autonomic neuropathy and tachycardia: The  patient's autonomic neuropathy and  heart rate improved as her BGs decreased, but worsened as her BGs increased. She is doing well today.      6. Hypertension: Her BP is good today. She still needs to try to  fit in more exercise.  7-8. Anhedonia and anxiety: She is doing much better now, partly due to taking her meds and partly due to working out some of the family problems. She still needs to call for help when she begins to do poorly. She also needs to continue to find ways to have more joy in her life. 9-12. GERD/esophagitis/difficulty swallowing/gastroparesis: Her reflux, other GI symptoms, and difficulty swallowing have all improved.   13-15: Hypocalcemia, vitamin D deficiency, and secondary hyperparathyroidism:   A. Due to her bariatric surgery she has more difficulty absorbing vitamin D and calcium. When she develops hypocalcemia and vitamin D deficiency, her parathyroid glands appropriately produce more PTH, resulting in secondary hyperparathyroidism. However, if she takes her calcium and vitamin D all three parameters will normalize.   B. Her calcium was normal in October 2018, but at the lower limit of normal in February 2019. Her vitamin D level was low-normal in May 2019.   C. In May 2019 her PTH was elevated to 120 and her calcium was 9.7. She still had secondary hyperparathyroidism. In October 2019 her calcium was at the low end of the reference range and her PTH was still elevated, but lower.  D. Her recent PTH level was mid-normal. Her calcium was at about the 45% of the reference range. She needs to take her vitamin D and calcium very consistently.  35. Noncompliance: Since last visit her compliance with eating right and taking her medications has markedly improved.    17: Muscle cramps: Her cramps occur only infrequently now.  18. Hypothyroidism: Her TFTs in May 2019 were normal. Her TFTs in December 2020 were normal.  Her current levothyroxine dosage is working well.  19. Sleeping difficulties: These problems have improved. I suspect that her increased caffeine intake was causing all of these problems.  20. Osteopenia/osteoporosis: Now that the patient's secondary hyperparathyroidism has  normalized, she would like to repeat her BMD. She is now interested in taking medication to protect her bones.   PLAN:  1. Diagnostic: I reviewed her fasting lab results from December 2020. I will order a BMD now.   2. Therapeutic: Continue medications as currently prescribed, to include metformin doses of 1000 mg in the mornings and 1000 mg in the evenings. Continue Synthroid at bedtime. Take calcium at mealtimes. Take [employer], 50,000 IU per week to improve her vitamin D level. Try to fit in exercise daily.  See Ms. Showfety in follow up.    3. Patient education: We discussed all of the above at great length. She must continue to take her medications and supplements. She also needs to see me every two months. When she knows that she is coming to see "[facility] Reeks", her compliance improves.  4. Follow-up: 2 months   Level of Service: This visit lasted in excess of 60 minutes. More than 50% of the visit was devoted to counseling.   Tillman Sers, MD, CDE Adult and Pediatric Endocrinology 02/02/2019 9:02 AM

## 2019-02-02 ENCOUNTER — Encounter (INDEPENDENT_AMBULATORY_CARE_PROVIDER_SITE_OTHER): Payer: Self-pay | Admitting: "Endocrinology

## 2019-02-02 ENCOUNTER — Other Ambulatory Visit: Payer: Self-pay

## 2019-02-02 ENCOUNTER — Ambulatory Visit (INDEPENDENT_AMBULATORY_CARE_PROVIDER_SITE_OTHER): Payer: No Typology Code available for payment source | Admitting: "Endocrinology

## 2019-02-02 ENCOUNTER — Ambulatory Visit: Payer: No Typology Code available for payment source | Admitting: Podiatry

## 2019-02-02 DIAGNOSIS — E559 Vitamin D deficiency, unspecified: Secondary | ICD-10-CM

## 2019-02-02 DIAGNOSIS — E66812 Obesity, class 2: Secondary | ICD-10-CM

## 2019-02-02 DIAGNOSIS — E1143 Type 2 diabetes mellitus with diabetic autonomic (poly)neuropathy: Secondary | ICD-10-CM | POA: Diagnosis not present

## 2019-02-02 DIAGNOSIS — E11649 Type 2 diabetes mellitus with hypoglycemia without coma: Secondary | ICD-10-CM | POA: Diagnosis not present

## 2019-02-02 DIAGNOSIS — I4711 Inappropriate sinus tachycardia, so stated: Secondary | ICD-10-CM

## 2019-02-02 DIAGNOSIS — R4584 Anhedonia: Secondary | ICD-10-CM

## 2019-02-02 DIAGNOSIS — E1140 Type 2 diabetes mellitus with diabetic neuropathy, unspecified: Secondary | ICD-10-CM

## 2019-02-02 DIAGNOSIS — E211 Secondary hyperparathyroidism, not elsewhere classified: Secondary | ICD-10-CM

## 2019-02-02 DIAGNOSIS — E063 Autoimmune thyroiditis: Secondary | ICD-10-CM

## 2019-02-02 DIAGNOSIS — M8580 Other specified disorders of bone density and structure, unspecified site: Secondary | ICD-10-CM

## 2019-02-02 DIAGNOSIS — R000 Tachycardia, unspecified: Secondary | ICD-10-CM

## 2019-02-02 LAB — POCT GLYCOSYLATED HEMOGLOBIN (HGB A1C): Hemoglobin A1C: 7.7 % — AB (ref 4.0–5.6)

## 2019-02-02 LAB — POCT GLUCOSE (DEVICE FOR HOME USE): POC Glucose: 101 mg/dl — AB (ref 70–99)

## 2019-02-02 NOTE — Patient Instructions (Signed)
Follow up visit in 2 months.  

## 2019-02-06 ENCOUNTER — Telehealth (INDEPENDENT_AMBULATORY_CARE_PROVIDER_SITE_OTHER): Payer: No Typology Code available for payment source | Admitting: Family Medicine

## 2019-02-06 ENCOUNTER — Other Ambulatory Visit: Payer: Self-pay

## 2019-02-06 DIAGNOSIS — E669 Obesity, unspecified: Secondary | ICD-10-CM

## 2019-02-06 DIAGNOSIS — Z9189 Other specified personal risk factors, not elsewhere classified: Secondary | ICD-10-CM | POA: Diagnosis not present

## 2019-02-06 DIAGNOSIS — E1140 Type 2 diabetes mellitus with diabetic neuropathy, unspecified: Secondary | ICD-10-CM | POA: Diagnosis not present

## 2019-02-06 DIAGNOSIS — Z6831 Body mass index (BMI) 31.0-31.9, adult: Secondary | ICD-10-CM

## 2019-02-08 NOTE — Progress Notes (Signed)
TeleHealth Visit:  Due to the COVID-19 pandemic, this visit was completed with telemedicine (audio/video) technology to reduce patient and provider exposure as well as to preserve personal protective equipment.   Alexa [name] has verbally consented to this TeleHealth visit. The patient is located at home, the provider is located at the [facility] office. The participants in this visit include the listed provider and patient and any and all parties involved. The visit was conducted today via telephone.  Alexa [name] was unable to use realtime audiovisual technology today and the telehealth visit was conducted via telephone.  Chief Complaint: OBESITY Alexa [name] is here to discuss her progress with her obesity treatment plan along with follow-up of her obesity related diagnoses. Alexa [name] is practicing portion control and making smarter food choices, such as increasing vegetables and decreasing simple carbohydrates and states she is following her eating plan approximately 65 to 70% of the time. Alexa [name] states she is exercising 0 minutes 0 times per week.  Today's visit was #: 12 Starting weight: 169 lbs Starting date: 11/16/2017  Interim History: Alexa [name] is tracking her protein and she is averaging 60 to 65 grams of protein per day. She is concentrating on eating more protein. She has cut our regular soda which is a great improvement for her.  Her reported weight is 172 pounds (02/06/19).  Subjective:   Type 2 diabetes mellitus with diabetic neuropathy, without long-term current use of insulin (HCC) Alexa [name]'s A1c is not at goal, but it is improved. Her last A1c was 7.7 (02/02/19), and is down from 8.2. She reports good compliance with medications. Her diabetes is managed by endo.   Osteoporosis Alexa [name] is scheduled for a BMD (bone mineral density) per her endo. She reports being more compliant with calcium. Her last vitamin D level was 64 in October 2019.  Assessment/Plan:   Type  2 diabetes mellitus with diabetic neuropathy, without long-term current use of insulin ([place])  Alexa [name] will continue all medications and she will follow up with endocrinology.  Osteoporosis We will check vitamin D level at the next in-office visit.  Obesity Alexa [name] is currently in the action stage of change. As such, her goal is to continue with weight loss efforts. She has agreed to practicing portion control and making smarter food choices, such as increasing vegetables and decreasing simple carbohydrates with 85 grams of protein per day.   We discussed the following behavioral modification strategies today: increasing lean protein intake, decreasing simple carbohydrates and increasing water intake.  Alexa [name] has agreed to follow-up with our clinic in 3 weeks. She was informed of the importance of frequent follow-up visits to maximize her success with intensive lifestyle modifications for her multiple health conditions.  Objective:   VITALS: Per patient if applicable, see vitals. GENERAL: Alert and in no acute distress. CARDIOPULMONARY: No increased WOB. Speaking in clear sentences.  PSYCH: Pleasant and cooperative. Speech normal rate and rhythm. Affect is appropriate. Insight and judgement are appropriate. Attention is focused, linear, and appropriate.  NEURO: Oriented as arrived to appointment on time with no prompting.   Lab Results  Component Value Date   CREATININE 1.25 (H) 01/25/2019   BUN 20 01/25/2019   NA 139 01/25/2019   K 4.7 01/25/2019   CL 103 01/25/2019   CO2 26 01/25/2019   Lab Results  Component Value Date   ALT 8 01/25/2019   AST 14 01/25/2019   ALKPHOS 85 05/27/2016   BILITOT 0.4 01/25/2019  Lab Results  Component Value Date   HGBA1C 7.7 (A) 02/02/2019   HGBA1C 8.2 (A) 09/08/2018   HGBA1C 7.7 (A) 03/24/2018   HGBA1C 6.9 (A) 11/01/2017   HGBA1C 10.0 04/12/2017   Lab Results  Component Value Date   INSULIN 8.1 11/16/2017   Lab Results  Component  Value Date   TSH 1.87 01/25/2019   Lab Results  Component Value Date   CHOL 169 01/25/2019   HDL 65 01/25/2019   LDLCALC 87 01/25/2019   TRIG 81 01/25/2019   CHOLHDL 2.6 01/25/2019   Lab Results  Component Value Date   WBC 8.1 12/02/2017   HGB 11.8 12/02/2017   HCT 35.3 12/02/2017   MCV 85.5 12/02/2017   PLT 407 (H) 12/02/2017   Lab Results  Component Value Date   IRON 55 05/08/2013   TIBC 357 04/14/2012    Ref. Range 11/01/2017 00:00  Vitamin D, 25-Hydroxy Latest Ref Range: 30 - 100 ng/mL 64    Attestation Statements:   Reviewed by clinician on day of visit: allergies, medications, problem list, medical history, surgical history, family history, social history, and previous encounter notes.  Time spent on visit including pre-visit chart review and post-visit care was 21 minutes (2:51-3:12 PM).   Corey Skains, am acting as Location manager for [employer], FNP-C.  I have reviewed the above documentation for accuracy and completeness, and I agree with the above. - Georgianne Fick, FNP

## 2019-02-09 MED FILL — ALPRAZolam 0.5 MG TABS: 0.5 | 30 days supply | Qty: 60 | Fill #5

## 2019-02-23 ENCOUNTER — Other Ambulatory Visit (INDEPENDENT_AMBULATORY_CARE_PROVIDER_SITE_OTHER): Payer: Self-pay | Admitting: "Endocrinology

## 2019-02-23 DIAGNOSIS — M8580 Other specified disorders of bone density and structure, unspecified site: Secondary | ICD-10-CM

## 2019-02-23 DIAGNOSIS — Z780 Asymptomatic menopausal state: Secondary | ICD-10-CM

## 2019-03-23 ENCOUNTER — Telehealth: Payer: Self-pay | Admitting: Internal Medicine

## 2019-03-23 NOTE — Telephone Encounter (Signed)
All she needs is a CBC she can come between 10:30am-12:30pm. Left detailed message.

## 2019-03-23 NOTE — Telephone Encounter (Signed)
Alexa [name] [phone]  [name] called to say that Dr Tobe Sos had done a lot of labs on her in January, so she wanted to see if we would still needs labs or if everything was covered in what he did. She will be in town on Monday and can come by and get labs if need be. She has her CPE scheduled for 03/31/2019

## 2019-03-24 ENCOUNTER — Telehealth: Payer: Self-pay | Admitting: Internal Medicine

## 2019-03-24 NOTE — Telephone Encounter (Signed)
Alexa [name] [phone]  Griffyn called to say that she needs a referral to go to Dr Jacqualyn Posey at [place] [facility]. She has the focus plan with [facility] that requires her to call them and get referral and she has done that, it is effective starting this past January for 365 days. She called to schedule appointment with his office because she is existing patient, they said she needs a referral from PCP.   I called his office and the lady I spoke with was very nice and she said that she had been told the patient would need a new referral, however her billing person or referral person was not in office today, she is going to check with them and let me know if they do indeed need a referral on an existing patient. She was also going to call patient and scheduled an appointment for the patient while we work out these details.

## 2019-03-27 ENCOUNTER — Ambulatory Visit (INDEPENDENT_AMBULATORY_CARE_PROVIDER_SITE_OTHER): Payer: No Typology Code available for payment source | Admitting: Podiatry

## 2019-03-27 ENCOUNTER — Other Ambulatory Visit: Payer: No Typology Code available for payment source | Admitting: Internal Medicine

## 2019-03-27 ENCOUNTER — Encounter (INDEPENDENT_AMBULATORY_CARE_PROVIDER_SITE_OTHER): Payer: Self-pay | Admitting: Family Medicine

## 2019-03-27 ENCOUNTER — Telehealth: Payer: Self-pay | Admitting: Podiatry

## 2019-03-27 ENCOUNTER — Other Ambulatory Visit: Payer: Self-pay

## 2019-03-27 ENCOUNTER — Ambulatory Visit (INDEPENDENT_AMBULATORY_CARE_PROVIDER_SITE_OTHER): Payer: No Typology Code available for payment source | Admitting: Family Medicine

## 2019-03-27 VITALS — BP 132/79 | HR 72 | Temp 97.9°F | Ht 63.0 in | Wt 169.0 lb

## 2019-03-27 DIAGNOSIS — E559 Vitamin D deficiency, unspecified: Secondary | ICD-10-CM | POA: Diagnosis not present

## 2019-03-27 DIAGNOSIS — E1142 Type 2 diabetes mellitus with diabetic polyneuropathy: Secondary | ICD-10-CM | POA: Diagnosis not present

## 2019-03-27 DIAGNOSIS — E669 Obesity, unspecified: Secondary | ICD-10-CM

## 2019-03-27 DIAGNOSIS — B351 Tinea unguium: Secondary | ICD-10-CM | POA: Diagnosis not present

## 2019-03-27 DIAGNOSIS — Z0000 Encounter for general adult medical examination without abnormal findings: Secondary | ICD-10-CM

## 2019-03-27 DIAGNOSIS — Z6830 Body mass index (BMI) 30.0-30.9, adult: Secondary | ICD-10-CM | POA: Diagnosis not present

## 2019-03-27 LAB — CBC WITH DIFFERENTIAL/PLATELET
Absolute Monocytes: 413 cells/uL (ref 200–950)
Basophils Absolute: 61 cells/uL (ref 0–200)
Basophils Relative: 1.1 %
Eosinophils Absolute: 171 cells/uL (ref 15–500)
Eosinophils Relative: 3.1 %
HCT: 36.8 % (ref 35.0–45.0)
Hemoglobin: 12.1 g/dL (ref 11.7–15.5)
Lymphs Abs: 2178 cells/uL (ref 850–3900)
MCH: 27.9 pg (ref 27.0–33.0)
MCHC: 32.9 g/dL (ref 32.0–36.0)
MCV: 84.8 fL (ref 80.0–100.0)
MPV: 9.7 fL (ref 7.5–12.5)
Monocytes Relative: 7.5 %
Neutro Abs: 2679 cells/uL (ref 1500–7800)
Neutrophils Relative %: 48.7 %
Platelets: 316 10*3/uL (ref 140–400)
RBC: 4.34 10*6/uL (ref 3.80–5.10)
RDW: 15.2 % — ABNORMAL HIGH (ref 11.0–15.0)
Total Lymphocyte: 39.6 %
WBC: 5.5 10*3/uL (ref 3.8–10.8)

## 2019-03-27 LAB — HEPATIC FUNCTION PANEL
AG Ratio: 1.8 (calc) (ref 1.0–2.5)
ALT: 10 U/L (ref 6–29)
AST: 13 U/L (ref 10–35)
Albumin: 4.2 g/dL (ref 3.6–5.1)
Alkaline phosphatase (APISO): 51 U/L (ref 37–153)
Bilirubin, Direct: 0.1 mg/dL (ref 0.0–0.2)
Globulin: 2.3 g/dL (calc) (ref 1.9–3.7)
Indirect Bilirubin: 0.3 mg/dL (calc) (ref 0.2–1.2)
Total Bilirubin: 0.4 mg/dL (ref 0.2–1.2)
Total Protein: 6.5 g/dL (ref 6.1–8.1)

## 2019-03-27 LAB — VITAMIN D 25 HYDROXY (VIT D DEFICIENCY, FRACTURES): Vit D, 25-Hydroxy: 71 ng/mL (ref 30–100)

## 2019-03-27 MED ORDER — VICTOZA 18 MG/3ML ~~LOC~~ SOPN: PEN_INJECTOR | SUBCUTANEOUS | 0 refills | Status: DC

## 2019-03-27 MED ORDER — EFINACONAZOLE 10 % EX SOLN: 1.0000 [drp] | Freq: Every day | CUTANEOUS | 11 refills | Status: DC

## 2019-03-27 MED FILL — VICTOZA 18 MG/3 ML INJECT P: 18 | 30 days supply | Qty: 9 | Fill #0

## 2019-03-27 NOTE — Addendum Note (Signed)
Addended by: Mady Haagensen on: 03/27/2019 11:26 AM   Modules accepted: Orders

## 2019-03-27 NOTE — Telephone Encounter (Signed)
Spoke with pt and her providers office on 03/24/19 about her focus plan and if she needed a referral again I had a over the phone referral from provider it was ok by Estrella Deeds

## 2019-03-27 NOTE — Patient Instructions (Signed)
Efinaconazole Topical Solution What is this medicine? EFINACONAZOLE (e FEE na KON a zole) is an antifungal medicine. It is used to treat certain kinds of fungal infections of the toenail. This medicine may be used for other purposes; ask your health care provider or pharmacist if you have questions. COMMON BRAND NAME(S): JUBLIA What should I tell my health care provider before I take this medicine? They need to know if you have any of these conditions:  an unusual or allergic reaction to efinaconazole, other medicines, foods, dyes or preservatives  pregnant or trying to get pregnant  breast-feeding How should I use this medicine? This medicine is for external use only. Do not take by mouth. Follow the directions on the label. Wash hands before and after use. Apply this medicine using the provided brush to cover the entire toenail. Do not use your medicine more often than directed. Finish the full course prescribed by your doctor or health care professional even if you think your condition is better. Do not stop using except on the advice of your doctor or health care professional. Talk to your pediatrician regarding the use of this medicine in children. While this drug may be prescribed for children as young as 6 years for selected conditions, precautions do apply. Overdosage: If you think you have taken too much of this medicine contact a poison control center or emergency room at once. NOTE: This medicine is only for you. Do not share this medicine with others. What if I miss a dose? If you miss a dose, use it as soon as you can. If it is almost time for your next dose, use only that dose. Do not use double or extra doses. What may interact with this medicine? Interactions have not been studied. Do not use any other nail products (i.e., nail polish, pedicures) during treatment with this medicine. This list may not describe all possible interactions. Give your health care provider a list of  all the medicines, herbs, non-prescription drugs, or dietary supplements you use. Also tell them if you smoke, drink alcohol, or use illegal drugs. Some items may interact with your medicine. What should I watch for while using this medicine? Do not get this medicine in your eyes. If you do, rinse out with plenty of cool tap water. Tell your doctor or health care professional if your symptoms do not start to get better or if they get worse. Wait for at least 10 minutes after bathing before applying this medication. After bathing, make sure that your feet are very dry. Fungal infections like moist conditions. Do not walk around barefoot. To help prevent reinfection, wear freshly washed cotton, not synthetic clothing. Tell your doctor or health care professional if you develop sores or blisters that do not heal properly. If your nail infection returns after you stop using this medicine, contact your doctor or health care professional. What side effects may I notice from receiving this medicine? Side effects that you should report to your doctor or health care professional as soon as possible:  allergic reactions like skin rash, itching or hives, swelling of the face, lips, or tongue  ingrown toenail Side effects that usually do not require medical attention (report to your doctor or health care professional if they continue or are bothersome):  mild skin irritation, burning, or itching This list may not describe all possible side effects. Call your doctor for medical advice about side effects. You may report side effects to FDA at 1-800-FDA-1088. Where should I  keep my medicine? Keep out of the reach of children. Store at room temperature between 20 and 25 degrees C (68 and 77 degrees F). Keep this medicine in the original container. Throw away any unused medicine after the expiration date. This medicine is flammable. Avoid exposure to heat, fire, flame, and smoking. NOTE: This sheet is a summary.  It may not cover all possible information. If you have questions about this medicine, talk to your doctor, pharmacist, or health care provider.  2020 Elsevier/Gold Standard (2018-05-23 16:14:11)  

## 2019-03-27 NOTE — Progress Notes (Signed)
Chief Complaint:   OBESITY Alexa [name] is here to discuss her progress with her obesity treatment plan along with follow-up of her obesity related diagnoses. [name] is practicing portion control and making smarter food choices, such as increasing vegetables and decreasing simple carbohydrates and states she is following her eating plan approximately 66% of the time. [name] states she is exercising 0 minutes 0 times per week.  Today's visit was #: 13 Starting weight: 169 lbs Starting date: 11/16/2017 Today's weight: 169 lbs Today's date: 03/27/2019 Total lbs lost to date: 0 Total lbs lost since last in-office visit: 10  Interim History: Alexa [name] has done a great job over the past 5-6 months with the plan. She has not weighed in our office since Sept 2020 - and she has lost 10 lbs.  Alexa [name] is counting protein and it varies between 40 and 85 grams/day. She tends to not get in enough protein on work days. She is rarely drinking soda. She does admit to occasional sweets. She reports being on one Contrave daily, but has occasional nausea with this.  Subjective:   Type 2 diabetes mellitus with peripheral neuropathy ([place]). Diabetes is not well controlled. Last A1c was 7.7 on 02/02/2019. She is compliant with Victoza, but takes metformin less consistently. She occasionally checks CBG's. She is interested in starting [name] for appetite.  Lab Results  Component Value Date   HGBA1C 7.7 (A) 02/02/2019   HGBA1C 8.2 (A) 09/08/2018   HGBA1C 7.7 (A) 03/24/2018   Lab Results  Component Value Date   MICROALBUR 2.1 01/25/2019   LDLCALC 87 01/25/2019   CREATININE 1.25 (H) 01/25/2019   Lab Results  Component Value Date   INSULIN 8.1 11/16/2017   Vitamin D deficiency. Last Vitamin D level 64 on 11/01/2017. Alexa [name] is on weekly Vitamin D. Level has not been checked in over a year.  Assessment/Plan:   Type 2 diabetes mellitus with peripheral neuropathy ([name]). Good blood sugar control is  important to decrease the likelihood of diabetic complications such as nephropathy, neuropathy, limb loss, blindness, coronary artery disease, and death. Intensive lifestyle modification including diet, exercise and weight loss are the first line of treatment for diabetes. Adanna was given a refill on her VICTOZA 76 MG/3ML SOPN 1.8 mg SQ #3 pens. She will continue metformin as directed. She will contact her endocrinologist to see if he is open to her taking Saxenda.  Vitamin D deficiency. Low Vitamin D level contributes to fatigue and are associated with obesity, breast, and colon cancer. She will have Dr. Renold Genta check Vitamin D this week.  Class 1 obesity with serious comorbidity and body mass index (BMI) of 30.0 to 30.9 in adult, unspecified obesity type.  Alexa [name] is currently in the action stage of change. As such, her goal is to continue with weight loss efforts. She has agreed to practicing portion control and making smarter food choices, such as increasing vegetables and decreasing simple carbohydrates. Protein goal is 85 grams a day.  She will check with Dr. Tobe Sos about starting Saxenda.  Exercise goals: No exercise has been prescribed at this time.  Behavioral modification strategies: increasing lean protein intake, decreasing simple carbohydrates, increasing water intake, meal planning and cooking strategies and planning for success.  Alexa [name] has agreed to follow-up with our clinic in 3 weeks. She was informed of the importance of frequent follow-up visits to maximize her success with intensive lifestyle modifications for her multiple health conditions.   Objective:   Blood pressure 132/79,  pulse 72, temperature 97.9 F (36.6 C), temperature source Oral, height 5\' 3"  (1.6 m), weight 169 lb (76.7 kg), last menstrual period 05/11/2010, SpO2 97 %. Body mass index is 29.94 kg/m.  General: Cooperative, alert, well developed, in no acute distress. HEENT: Conjunctivae and lids  unremarkable. Cardiovascular: Regular rhythm.  Lungs: Normal work of breathing. Neurologic: No focal deficits.   Lab Results  Component Value Date   CREATININE 1.25 (H) 01/25/2019   BUN 20 01/25/2019   NA 139 01/25/2019   K 4.7 01/25/2019   CL 103 01/25/2019   CO2 26 01/25/2019   Lab Results  Component Value Date   ALT 8 01/25/2019   AST 14 01/25/2019   ALKPHOS 85 05/27/2016   BILITOT 0.4 01/25/2019   Lab Results  Component Value Date   HGBA1C 7.7 (A) 02/02/2019   HGBA1C 8.2 (A) 09/08/2018   HGBA1C 7.7 (A) 03/24/2018   HGBA1C 6.9 (A) 11/01/2017   HGBA1C 10.0 04/12/2017   Lab Results  Component Value Date   INSULIN 8.1 11/16/2017   Lab Results  Component Value Date   TSH 1.87 01/25/2019   Lab Results  Component Value Date   CHOL 169 01/25/2019   HDL 65 01/25/2019   LDLCALC 87 01/25/2019   TRIG 81 01/25/2019   CHOLHDL 2.6 01/25/2019   Lab Results  Component Value Date   WBC 8.1 12/02/2017   HGB 11.8 12/02/2017   HCT 35.3 12/02/2017   MCV 85.5 12/02/2017   PLT 407 (H) 12/02/2017   Lab Results  Component Value Date   IRON 55 05/08/2013   TIBC 357 04/14/2012   Attestation Statements:   Reviewed by clinician on day of visit: allergies, medications, problem list, medical history, surgical history, family history, social history, and previous encounter notes.  IMichaelene Song, am acting as Location manager for [employer], FNP   I have reviewed the above documentation for accuracy and completeness, and I agree with the above. -  Georgianne Fick, FNP

## 2019-03-28 ENCOUNTER — Encounter (INDEPENDENT_AMBULATORY_CARE_PROVIDER_SITE_OTHER): Payer: Self-pay

## 2019-03-31 ENCOUNTER — Ambulatory Visit (INDEPENDENT_AMBULATORY_CARE_PROVIDER_SITE_OTHER): Payer: No Typology Code available for payment source | Admitting: Internal Medicine

## 2019-03-31 ENCOUNTER — Encounter: Payer: Self-pay | Admitting: Internal Medicine

## 2019-03-31 ENCOUNTER — Other Ambulatory Visit: Payer: Self-pay

## 2019-03-31 VITALS — BP 130/80 | HR 75 | Ht 63.0 in | Wt 169.0 lb

## 2019-03-31 DIAGNOSIS — F419 Anxiety disorder, unspecified: Secondary | ICD-10-CM | POA: Diagnosis not present

## 2019-03-31 DIAGNOSIS — F329 Major depressive disorder, single episode, unspecified: Secondary | ICD-10-CM

## 2019-03-31 DIAGNOSIS — Z0000 Encounter for general adult medical examination without abnormal findings: Secondary | ICD-10-CM | POA: Diagnosis not present

## 2019-03-31 DIAGNOSIS — R7989 Other specified abnormal findings of blood chemistry: Secondary | ICD-10-CM

## 2019-03-31 DIAGNOSIS — F32A Depression, unspecified: Secondary | ICD-10-CM

## 2019-03-31 DIAGNOSIS — Z62810 Personal history of physical and sexual abuse in childhood: Secondary | ICD-10-CM

## 2019-03-31 DIAGNOSIS — E063 Autoimmune thyroiditis: Secondary | ICD-10-CM

## 2019-03-31 DIAGNOSIS — E038 Other specified hypothyroidism: Secondary | ICD-10-CM

## 2019-03-31 DIAGNOSIS — M5431 Sciatica, right side: Secondary | ICD-10-CM

## 2019-03-31 DIAGNOSIS — Z8709 Personal history of other diseases of the respiratory system: Secondary | ICD-10-CM

## 2019-03-31 DIAGNOSIS — N951 Menopausal and female climacteric states: Secondary | ICD-10-CM

## 2019-03-31 DIAGNOSIS — E1140 Type 2 diabetes mellitus with diabetic neuropathy, unspecified: Secondary | ICD-10-CM

## 2019-03-31 DIAGNOSIS — M545 Low back pain, unspecified: Secondary | ICD-10-CM

## 2019-03-31 DIAGNOSIS — Z9884 Bariatric surgery status: Secondary | ICD-10-CM

## 2019-03-31 DIAGNOSIS — M5416 Radiculopathy, lumbar region: Secondary | ICD-10-CM

## 2019-03-31 LAB — POCT URINALYSIS DIPSTICK
Appearance: NEGATIVE
Bilirubin, UA: NEGATIVE
Blood, UA: NEGATIVE
Glucose, UA: NEGATIVE
Leukocytes, UA: NEGATIVE
Nitrite, UA: NEGATIVE
Odor: NEGATIVE
Protein, UA: NEGATIVE
Spec Grav, UA: 1.01 (ref 1.010–1.025)
Urobilinogen, UA: 0.2 E.U./dL
pH, UA: 6.5 (ref 5.0–8.0)

## 2019-03-31 MED ORDER — ALBUTEROL SULFATE (2.5 MG/3ML) 0.083% IN NEBU: 2.5000 mg | INHALATION_SOLUTION | Freq: Four times a day (QID) | RESPIRATORY_TRACT | 1 refills | Status: DC | PRN

## 2019-03-31 MED ORDER — ALPRAZOLAM 0.5 MG PO TABS: ORAL_TABLET | ORAL | 5 refills | Status: DC

## 2019-03-31 MED ORDER — ESCITALOPRAM OXALATE 20 MG PO TABS: 20.0000 mg | ORAL_TABLET | Freq: Every day | ORAL | 1 refills | Status: DC

## 2019-03-31 MED ORDER — CYCLOBENZAPRINE HCL 10 MG PO TABS: 10.0000 mg | ORAL_TABLET | Freq: Three times a day (TID) | ORAL | 0 refills | Status: DC | PRN

## 2019-03-31 MED ORDER — ESTROGENS, CONJUGATED 0.625 MG/GM VA CREA: 1.0000 | TOPICAL_CREAM | Freq: Every day | VAGINAL | 11 refills | Status: DC

## 2019-03-31 MED ORDER — ALBUTEROL SULFATE HFA 108 (90 BASE) MCG/ACT IN AERS: 2.0000 | INHALATION_SPRAY | Freq: Four times a day (QID) | RESPIRATORY_TRACT | 11 refills | Status: AC | PRN

## 2019-03-31 MED FILL — ALBUTEROL 0.083% INHAL SOLN: (2.5 MG/3ML | 12 days supply | Qty: 150 | Fill #0

## 2019-03-31 MED FILL — ALBUTEROL SULFATE HFA 108 (: 108 (90 BAS | 25 days supply | Qty: 18 | Fill #0

## 2019-03-31 MED FILL — ESCITALOPRAM 20 MG TABLET: 20 | 90 days supply | Qty: 90 | Fill #0

## 2019-03-31 MED FILL — PREMARIN VAGINAL CREAM-APPL: 0.625 | 21 days supply | Qty: 30 | Fill #0

## 2019-03-31 MED FILL — ALPRAZolam 0.5 MG TABS: 0.5 | 30 days supply | Qty: 60 | Fill #0

## 2019-03-31 MED FILL — CYCLOBENZAPRINE HCL 10 MG T: 10 | 30 days supply | Qty: 90 | Fill #0

## 2019-04-04 MED FILL — PREMARIN VAGINAL CREAM-APPL: 0.625 | 21 days supply | Qty: 30 | Fill #0

## 2019-04-06 NOTE — Progress Notes (Signed)
Subjective: 59 year old female presents the office today for follow-up evaluation of onychomycosis that she states that the nails are getting worse.  She has been on Lamisil with any side effects.  She is asked about other treatment options.,  Denies any pain in the nails and denies any redness or drainage. Denies any systemic complaints such as fevers, chills, nausea, vomiting. No acute changes since last appointment, and no other complaints at this time.   Objective: AAO x3, NAD DP/PT pulses palpable bilaterally, CRT less than 3 seconds There is still yellow-brown discoloration of the toenails of the left side worse than the right.  There is no pain in the nails there is no redness or drainage.  Concerned about the coloration becoming more on the proximal nail. No open lesions or pre-ulcerative lesions.  No pain with calf compression, swelling, warmth, erythema  Assessment: Onychomycosis  Plan: -All treatment options discussed with the patient including all alternatives, risks, complications.  -We discussed various treatment options including changing to oral medication.  However were to continue with the Lamisil and finished a course of treatment.  Also order Jublia.  If she is not able to get this will try different topical. -Patient encouraged to call the office with any questions, concerns, change in symptoms.   Trula Slade DPM

## 2019-04-10 ENCOUNTER — Encounter (INDEPENDENT_AMBULATORY_CARE_PROVIDER_SITE_OTHER): Payer: Self-pay | Admitting: Family Medicine

## 2019-04-10 DIAGNOSIS — Z6830 Body mass index (BMI) 30.0-30.9, adult: Secondary | ICD-10-CM

## 2019-04-10 DIAGNOSIS — E669 Obesity, unspecified: Secondary | ICD-10-CM

## 2019-04-10 MED ORDER — SAXENDA 18 MG/3ML ~~LOC~~ SOPN: 3.0000 mg | PEN_INJECTOR | Freq: Every day | SUBCUTANEOUS | 0 refills | Status: DC

## 2019-04-10 NOTE — Telephone Encounter (Signed)
PA started for [facility]

## 2019-04-10 NOTE — Telephone Encounter (Signed)
Please advise 

## 2019-04-11 ENCOUNTER — Other Ambulatory Visit (INDEPENDENT_AMBULATORY_CARE_PROVIDER_SITE_OTHER): Payer: Self-pay

## 2019-04-11 MED ORDER — INSULIN PEN NEEDLE 32G X 4 MM MISC: 0 refills | Status: DC

## 2019-04-12 ENCOUNTER — Encounter (INDEPENDENT_AMBULATORY_CARE_PROVIDER_SITE_OTHER): Payer: Self-pay

## 2019-04-14 MED FILL — SAXENDA 18 MG/3 ML PEN: 18 | 30 days supply | Qty: 15 | Fill #0
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
## 2019-04-25 NOTE — Patient Instructions (Signed)
We asked that he have B12 level checked in the near future.  We failed to do this today.  You do not have to be fasting.  Continue current medications and follow-up in 1 year or as needed.  Have mammogram and bone density study.  FMLA paperwork for back pain will be completed

## 2019-04-27 MED FILL — PANTOPRAZOLE SOD DR 40 MG T: 40 | 90 days supply | Qty: 90 | Fill #1

## 2019-04-27 MED FILL — TELMISARTAN 40 MG TABLET: 40 | 90 days supply | Qty: 90 | Fill #1

## 2019-04-27 MED FILL — CARVEDILOL 3.125 MG TABLET: 3.125 | 90 days supply | Qty: 180 | Fill #2

## 2019-05-01 MED FILL — ALPRAZolam 0.5 MG TABS: 0.5 | 30 days supply | Qty: 60 | Fill #1

## 2019-05-04 ENCOUNTER — Other Ambulatory Visit: Payer: No Typology Code available for payment source | Admitting: Internal Medicine

## 2019-05-04 ENCOUNTER — Other Ambulatory Visit: Payer: Self-pay

## 2019-05-04 DIAGNOSIS — Z9884 Bariatric surgery status: Secondary | ICD-10-CM

## 2019-05-04 LAB — VITAMIN B12: Vitamin B-12: 330 pg/mL (ref 200–1100)

## 2019-05-15 ENCOUNTER — Ambulatory Visit: Admission: RE | Admit: 2019-05-15 | Payer: No Typology Code available for payment source | Source: Ambulatory Visit

## 2019-05-30 MED FILL — ALPRAZolam 0.5 MG TABS: 0.5 | 30 days supply | Qty: 60 | Fill #2

## 2019-06-02 ENCOUNTER — Encounter (INDEPENDENT_AMBULATORY_CARE_PROVIDER_SITE_OTHER): Payer: Self-pay | Admitting: "Endocrinology

## 2019-06-02 ENCOUNTER — Ambulatory Visit (INDEPENDENT_AMBULATORY_CARE_PROVIDER_SITE_OTHER): Payer: No Typology Code available for payment source | Admitting: "Endocrinology

## 2019-06-02 ENCOUNTER — Other Ambulatory Visit: Payer: Self-pay

## 2019-06-02 VITALS — BP 118/80 | HR 86 | Ht 63.5 in | Wt 173.0 lb

## 2019-06-02 DIAGNOSIS — E211 Secondary hyperparathyroidism, not elsewhere classified: Secondary | ICD-10-CM

## 2019-06-02 DIAGNOSIS — E1140 Type 2 diabetes mellitus with diabetic neuropathy, unspecified: Secondary | ICD-10-CM

## 2019-06-02 DIAGNOSIS — E559 Vitamin D deficiency, unspecified: Secondary | ICD-10-CM

## 2019-06-02 DIAGNOSIS — E063 Autoimmune thyroiditis: Secondary | ICD-10-CM | POA: Diagnosis not present

## 2019-06-02 DIAGNOSIS — I10 Essential (primary) hypertension: Secondary | ICD-10-CM | POA: Diagnosis not present

## 2019-06-02 DIAGNOSIS — R4584 Anhedonia: Secondary | ICD-10-CM

## 2019-06-02 DIAGNOSIS — M8580 Other specified disorders of bone density and structure, unspecified site: Secondary | ICD-10-CM

## 2019-06-02 DIAGNOSIS — E11649 Type 2 diabetes mellitus with hypoglycemia without coma: Secondary | ICD-10-CM | POA: Diagnosis not present

## 2019-06-02 LAB — POCT GLYCOSYLATED HEMOGLOBIN (HGB A1C): Hemoglobin A1C: 7.7 % — AB (ref 4.0–5.6)

## 2019-06-02 LAB — POCT GLUCOSE (DEVICE FOR HOME USE): Glucose Fasting, POC: 138 mg/dL — AB (ref 70–99)

## 2019-06-02 NOTE — Patient Instructions (Signed)
Follow up visit in 3 months. Please repeat her lab tests in about 2 months.

## 2019-06-02 NOTE — Progress Notes (Signed)
Subjective:  Patient Name: Alexa [name] Date of Birth: 05-23-1960  MRN: [id number]  Alexa [name]  presents to the office today for follow-up of her type 2 diabetes mellitus, obesity, combined hyperlipidemia, GERD, hypertension, ASHD, dyspepsia, pedal edema, non-proliferative diabetic retinopathy, goiter, depression, autonomic neuropathy, tachycardia, peripheral neuropathy, acquired hypothyroidism, vitamin D deficiency, hypocalcemia, secondary hyperparathyroidism, glossitis, pallor, fatigue, iron deficiency anemia, anhedonia, disinclination to take medicines, and status post Roux-en-Y gastric bypass.   HISTORY OF PRESENT ILLNESS:   Alexa [name] is a 59 y.o. Caucasian woman. Alexa [name] was unaccompanied.  1. The patient first presented to me on 08/20/04 in referral from her primary care internist, Dr. Emeline General, for evaluation and management of her type 2 diabetes, obesity, and multiple medical issues. She was 59 years old.   Alexa [name] had a long history of obesity. She was heavy as a child. She underwent menarche at age 60. At age 71 she was 180 pounds. In 1996 she weighed 218 pounds. In 1999 she was diagnosed with type 2 diabetes mellitus. Her weight at that point was 250 pounds. She was treated with Glucophage, and Actos. Actos made her gain even more weight. She had also been treated with glipizide in the past. More recently she had been treated with Lantus and Glucophage plus regular insulin as needed, especially for when she took steroids for asthma attacks. Maximum weight had been 296 pounds one month prior to that first visit with me. Her tendency to gain weight and her difficulty in losing weight were aggravated by long-standing, intermittent depression and by severe, recurrent asthma requiring the use of steroid medications.  B. Past medical history was also positive for a 60% blockage in one of her coronary arteries. She had significant issues with GERD and dyspepsia. She also had combined  hyperlipidemia. She had a previous cardiac catheterization and previous tonsillectomy. She was allergic to theophylline. Her pertinent review of systems was positive for some numbness and tingling in her feet. Family history was positive for type 2 diabetes in her father and her maternal grandmother. Her brother weighed 250 pounds at a height of 76 inches. Both her mother and her sister were hypothyroid.  C. On physical examination, her weight was 279.9 pounds. Her BMI was 49.6. Her blood pressure was 142/86. Her heart rate was 96. Her hemoglobin A1c was 9.8%. She was alert and oriented x3. Her affect was normal. Her insight was fair. She was obviously quite obese. She had a 25 gm thyroid gland. She had 1+ tremor of her hands. She had 1+ DP pulses and 2+ tinea pedis. Sensation to touch was intact in her feet. Laboratory data included a normal CMP. Her cholesterol was 175, triglycerides 76, HDL 53, and LDL 107. Her TSH was 2.98. Since she obviously did have type 2 diabetes mellitus associated with morbid obesity, and since weight loss was a major factor for her, I asked her to resume her metformin twice daily. I also started her on Byetta, initially 5 mcg twice daily and later 10 mcg twice daily. I discontinued her Lantus insulin.  2. During the last 15 years,we have had some successes, some failures, and some new problem areas.  A. T2DM: In October 2007 the combination of Byetta and metformin was causing more gastrointestinal problems. She opted to stop the metformin and continue the Byetta because it was helping her with weight control and blood sugar control. By 08/11/06 her weight had decreased to 267.6 pounds. Hemoglobin A1c was 8.6%. At that point  she decided to have bariatric surgery. She had a Roux-en-Y gastric bypass on 12/20/2006. She subsequently lost weight down to 173.4 pounds on 05/23/08, but then subsequently regained weight to the 190s. Her  hemoglobin A1c values varied in parallel with her weights.  On 05/23/08, at the point of her lowest weight, her hemoglobin A1c dropped to a nadir of 6.3%.  Since then her hemoglobin A1c values have varied between 6.6 and 13.4%. Although we were initially able to stop all of her diabetes medicines after bariatric surgery, when she began to regain weight we restarted metformin, 500 mg twice daily. Although she took her metformin twice daily for a long time, she had discontinued it many years ago. She hated to take medicines and could not make herself be compliant in taking medications or exercising. When her HbA1c increased to 9.4% in September 2015, I stopped her Byetta injections and started her on liraglutide (Victoza) daily injections on 11/16/13. After several more years of noncompliance, she has been doing much better in the past 2 years.   B. Vitamin D deficiency, hypocalcemia, and secondary hyperparathyroidism: Just before her gastric bypass, I obtained baseline bone mineral metabolism studies. Her 25-hydroxy vitamin D was very low at 7 (normal greater than or equal to 30). Her calcium was 9.5 (normal 8.6-10.6). Her parathyroid hormone was 38.4 (normal 14-72). Subsequent to her surgery, the patient was supposed to be taking multivitamins with calcium and vitamin D, but did not always do so. On 03/29/2007 her 25-hydroxy vitamin D was 29, but her calcium decreased to 8.7. Her PTH was slightly elevated at 73.5. Her 1,25-hydroxy vitamin D was 46. Her iron was 56. I asked her to make sure she took her multivitamins and calcium daily. Unfortunately, she has often been non-compliant with her multivitamins and calcium. Her 25-hydroxy vitamin D values have varied between 15-35. Her calcium values have varied between 8.1-9.4. Her PTH values have remained elevated between 79-167. Alexa [name]'s iron levels have also been low, between 32-34. She was supposed to be taking iron every day, but frequently did not do so. In the past year, however, she has been doing better at taking her  vitamin D, calcium, and iron. In May 2019 her vitamin D was at the lower end of the reference range, her calcium was about the 40% of the reference range, but her PTH was still significantly elevated. In December 2020 her PTH and calcium were mid-normal.   C. Alexa [name] has had psychological issues for many years that have adversely affected her eating, her unwillingness to exercise, her noncompliance with taking medications, her weight, and her T2DM care. Fortunately, her recent outpatient psychological therapy with Ms. Rea College, RN, MS and her taking Lexapro regularly have resulted in Alexa [name] having a much more positive attitude about life in general and a generally more positive attitude about taking her medications. Fortunately, Alexa [name] has been dealing successfully with her issues and her compliance has markedly improved.   D. Obesity: Although Alexa [name] regained a great deal of weight in the years following her bariatric surgery, in the past year she has been taking Contrave and has been able to lose all of the weight that she had gained after her bariatric surgery.   3. Alexa [name]'s last [facility] visit was on 02/02/19. At that visit I asked her to continue her morning dose of metformin of 1000 mg and continue the evening dose of 1000 mg. I continued her Synthroid dose at bedtime. I asked her to take one Biotech, 50,000 IU  capsule of vitamin D weekly and calcium at each meal.   A. She has been healthy. She had been taking all of her medications more consistently until about 10 days ago, when she stopped taking the medications for about a week. She is back on track now. She feels better physically and emotionally when she takes the medications.  B. She has been stressed recently because of her husband's new health issues.    C. She continues to have some shakiness of her hands that has been diagnosed as familial tremor.   D. She has been sleeping better, but still takes Xanax intermittently. She is no longer  drinking regular Coke very often. She does drink coffee on some mornings and tea at times.   E. She no longer takes Contrave. She has lost weight after taking 3 mg Saxenda (liraglutide) daily. She is followed at [facility] by Dr. Leafy Ro.   Alexa [name] remains in counseling with Alexa [name] every month.    G . She has not had much of a problem with asthma this year.    H. She still has some hot flashes, but not very often.   I. She has still been bothered by pains in her anterior shins.   J. 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   K. She is taking Lexapro, 20 mg/day; Synthroid, 25 mcg/day; Micardis, 20 mg, twice daily; Saxenda, 3 mg/day; Protonix, 40 mg/day; metformin 1000 mg on the morning and 1000 mg in the evening. She takes one 800+ mg Citracal tablet twice daily. She is also taking Coreg, 3.125 mg, twice daily. She is also taking B12 daily, Biotech once weekly, and oral fungal treatment for her tinea pedis.   L. She is doing pretty well at not allowing herself to become over-committed. She is trying to achieve more balance.    4. Review of Systems: There are no other significant issues  Constitutional: "I feel fair physically after a week not taking my medicines. Mentally I feel good." Marital relations are going very well.   Eyes: Her eyes sometimes hurt. She is also sensitive to light at times. Vision has not been as good. Her last eye exam occurred in the Fall of 2020. No diabetic eye disease was noted. She has some macular edema in her left eye, but this finding had not changed over time.  Neck: She occasionally has trouble swallowing, but not often. The patient has no complaints of anterior neck swelling, soreness, tenderness, pressure, or discomfort.  Heart: She saw her cardiologist in August 2019. Her planned follow up visit in March 2020 was cancelled due to covid-19. She will have a follow up appointment soon. She has not  had any chest pain or pressure. Heart rate increases with exercise or other physical activity. She has no complaints of palpitations or irregular heat beats Gastrointestinal: She has nausea occasionally. She rarely  has the sensation of stomach pains. She still occasionally has post-prandial bloating if she eats too much. She occasionally has head hunger and belly hunger. She is not having acid reflux, upset stomach, stomach aches or pains, swallowing difficulties, diarrhea, or constipation.  Legs: As above. There are no complaints of numbness, tingling, or burning. No edema is noted. Feet: There are no complaints of numbness, tingling, burning, or pain. No edema is noted. Tinea improved with her new laser therapy.   Neuro: No new sensory or muscular problems Psych: She is doing pretty  well, considering her husband's recent health problems..    GU: She has not been urinating a lot. She has not had much nocturia.   GYN: LMP was on 05/11/2010. She had a pap smear in 2020.  Hypoglycemia: None   5. BG printout: She is not checking BGs.  PAST MEDICAL, FAMILY, AND SOCIAL HISTORY:  Past Medical History:  Diagnosis Date  . Anemia, iron deficiency   . Anhedonia   . Anxiety   . Asthma   . Asthma, chronic   . Back pain   . CAD (coronary artery disease)   . Chewing difficulty   . Chronic insomnia   . Combined hyperlipidemia   . Constipation   . Depression   . Diabetes mellitus   . Diabetes mellitus type II   . Diabetic autonomic neuropathy ([place])   . Difficulty swallowing pills   . DM type 2 with diabetic peripheral neuropathy ([place])   . Dry mouth   . Dyspepsia   . Dyspnea   . Elevated homocysteine   . Essential tremor   . Fatigue   . Fatty liver   . GERD (gastroesophageal reflux disease)   . GERD (gastroesophageal reflux disease)   . Glossitis   . Goiter   . Hemorrhoids   . HLD (hyperlipidemia)   . Hyperparathyroidism , secondary, non-renal ([place])   . Hypertension   . Hypocalcemia    . Insomnia   . Leg edema   . Leg pain   . Macular degeneration   . Nonproliferative diabetic retinopathy associated with type 2 diabetes mellitus ([place])    RESOLVED  . Obesity, Class III, BMI 40-49.9 (morbid obesity) ([place])   . Osteopenia   . Osteoporosis   . Pallor   . Status post gastric surgery   . Tachycardia   . Thyroiditis, autoimmune   . Vaginal dryness, menopausal   . Vitamin D deficiency     Family History  Problem Relation Age of Onset  . Thyroid disease Mother        Hypothyroid  . Depression Mother   . Liver disease Mother   . Obesity Mother   . Diabetes Father        T2 DM  . Hypertension Father   . Hyperlipidemia Father   . Heart disease Father   . Obesity Brother        250 pounds at a height of 76 inches.  . Thyroid disease Sister        Hypothyroid  . Diabetes Maternal Grandmother   . Colon cancer Other        great grandfather  . Liver disease Maternal Aunt        NASH  . Esophageal cancer Neg Hx   . Pancreatic cancer Neg Hx   . Kidney disease Neg Hx      Current Outpatient Medications:  .  albuterol (PROVENTIL) (2.5 MG/3ML) 0.083% nebulizer solution, Take 3 mLs (2.5 mg total) by nebulization every 6 (six) hours as needed for wheezing or shortness of breath., Disp: 150 mL, Rfl: 1 .  albuterol (VENTOLIN HFA) 108 (90 Base) MCG/ACT inhaler, Inhale 2 puffs into the lungs every 6 (six) hours as needed for wheezing or shortness of breath., Disp: 6.7 g, Rfl: 11 .  ALPRAZolam (XANAX) 0.5 MG tablet, One half to one tab po twice daily prn anxiety, Disp: 60 tablet, Rfl: 5 .  Blood Glucose Monitoring Suppl (FREESTYLE LITE) DEVI, Use to check glucose 3x daily, Disp: 2 each, Rfl: 5 .  calcium-vitamin D (OSCAL WITH D) 500-200 MG-UNIT TABS tablet, Take by mouth., Disp: , Rfl:  .  conjugated estrogens (PREMARIN) vaginal cream, Place 1 Applicatorful vaginally at bedtime. Use for 21 days then off for 7 days, Disp: 42.5 g, Rfl: 11 .  COREG 3.125 MG tablet, Take 1  tablet (3.125 mg total) by mouth 2 (two) times daily with a meal., Disp: 60 tablet, Rfl: 11 .  Cyanocobalamin (VITAMIN B 12) 100 MCG LOZG, Take by mouth., Disp: , Rfl:  .  cyclobenzaprine (FLEXERIL) 10 MG tablet, Take 1 tablet (10 mg total) by mouth 3 (three) times daily as needed for muscle spasms., Disp: 90 tablet, Rfl: 0 .  diphenhydrAMINE (SOMINEX) 25 MG tablet, Take by mouth., Disp: , Rfl:  .  Efinaconazole 10 % SOLN, Apply 1 drop topically daily., Disp: 4 mL, Rfl: 11 .  escitalopram (LEXAPRO) 20 MG tablet, Take 1 tablet (20 mg total) by mouth daily., Disp: 90 tablet, Rfl: 1 .  Ferrous Sulfate (SLOW FE PO), Take 1 tablet by mouth daily. , Disp: , Rfl:  .  glucose blood (FREESTYLE LITE) test strip, Use to check glucose 3x daily, Disp: 300 each, Rfl: 1 .  ibuprofen (ADVIL,MOTRIN) 200 MG tablet, Take 600 mg by mouth daily as needed for headache or moderate pain., Disp: , Rfl:  .  Insulin Pen Needle 32G X 4 MM MISC, Use with Saxenda daily, Disp: 100 each, Rfl: 0 .  levothyroxine (SYNTHROID) 25 MCG tablet, TAKE 1 TABLET BY MOUTH ONCE DAILY, Disp: 90 tablet, Rfl: 1 .  Liraglutide -Weight Management (SAXENDA) 18 MG/3ML SOPN, Inject 0.5 mLs (3 mg total) into the skin daily., Disp: 5 pen, Rfl: 0 .  metFORMIN (GLUCOPHAGE) 1000 MG tablet, TAKE 1 TABLET BY MOUTH 2 TIMES DAILY WITH A MEAL., Disp: 180 tablet, Rfl: 1 .  Multiple Vitamin (MULTIVITAMIN) tablet, Take 1 tablet by mouth daily. Reported on 02/26/2015, Disp: 30 tablet, Rfl: 0 .  Naltrexone-buPROPion HCl ER 8-90 MG TB12, Take by mouth., Disp: , Rfl:  .  pantoprazole (PROTONIX) 40 MG tablet, Take 1 tablet (40 mg total) by mouth daily., Disp: 90 tablet, Rfl: 4 .  telmisartan (MICARDIS) 40 MG tablet, Take 0.5 tablets (20 mg total) by mouth 2 (two) times daily., Disp: 90 tablet, Rfl: 3 .  terbinafine (LAMISIL) 250 MG tablet, Take 1 tablet (250 mg total) by mouth daily., Disp: 90 tablet, Rfl: 0 .  traMADol (ULTRAM) 50 MG tablet, Take 1 tablet (50 mg  total) by mouth daily as needed for moderate pain or severe pain., Disp: 30 tablet, Rfl: 1 .  Vitamin D, Ergocalciferol, (DRISDOL) 50000 units CAPS capsule, Take 50,000 Units by mouth every Sunday., Disp: , Rfl:  .  ondansetron (ZOFRAN) 4 MG tablet, Take 1 tablet (4 mg total) by mouth every 8 (eight) hours as needed for nausea or vomiting. (Patient not taking: Reported on 06/02/2019), Disp: 20 tablet, Rfl: 0 .  VICTOZA 18 MG/3ML SOPN, INJECT 1.8 MG DAILY AS DIRECTED (Patient not taking: Reported on 06/02/2019), Disp: 3 pen, Rfl: 0  Allergies as of 06/02/2019 - Review Complete 06/02/2019  Allergen Reaction Noted  . Theophyllines Palpitations 05/13/2010    1. 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  2. Activities: She has not been walking for exercise.     3. Smoking, alcohol, or drugs: None 4. Primary Care Provider:  Dr. Tommie Ard Baxley 5. Therapist: Ms. Rea College, MS 6. Bariatrician: Dr. Redgie Grayer, MD  REVIEW OF SYSTEM: There are no other significant problems involving Alexa [name]'s other body systems.   Objective:  Vital Signs: BP 118/80   Pulse 86   Ht 5' 3.5" (1.613 m)   Wt 173 lb (78.5 kg)   LMP 05/11/2010   BMI 30.16 kg/m    Wt Readings from Last 3 Encounters:  06/02/19 173 lb (78.5 kg)  03/31/19 169 lb (76.7 kg)  03/27/19 169 lb (76.7 kg)    Ht Readings from Last 3 Encounters:  06/02/19 5' 3.5" (1.613 m)  03/31/19 5\' 3"  (1.6 m)  03/27/19 5\' 3"  (1.6 m)   Body mass index is 30.16 kg/m. Facility age limit for growth percentiles is 20 years.  Body surface area is 1.88 meters squared.   PHYSICAL EXAM:  Constitutional:  Alexa [name] looks good today. She is alert and bright. Her affect and insight are very good. She is wearing a dress and sandals today. She has gained 1 pound since her last visit. Her weight is now slightly  below the post-gastric  bypass nadir of 173.4 pounds that it was in 2010. Eyes: There is no arcus or proptosis.  Mouth: The oropharynx appears normal. The tongue appears normal. There is normal oral moisture. There is no obvious gingivitis. Neck: There are no bruits present. The thyroid gland appears normal in size. The thyroid gland is again within normal limits at 18-20 grams in size. The consistency of the thyroid gland is normal. There is no thyroid tenderness to palpation. Lungs: The lungs are clear. Air movement is good. Heart: The heart rhythm and rate appear normal. Heart sounds S1 and S2 are normal. I do not appreciate any pathologic heart murmurs. Abdomen: The abdomen is enlarged. Bowel sounds are normal. The abdomen is soft and non-tender. There is no obviously palpable hepatomegaly, splenomegaly, or other masses.  Arms: Muscle mass appears appropriate for age.  Hands: She has a 1+ tremor. Phalangeal and metacarpophalangeal joints appear normal. Palms are normal. Legs: Muscle mass appears appropriate for age. There is no edema. Her shins are not tender to touch today.  Feet: There are no significant deformities. Dorsalis pedis pulses are normal 2+ on the right and 2+ on the left. She has much less visible tinea in her great toenails. Her left great toenail is healing. Neurologic: Muscle strength is normal for age and gender  in both the upper and the lower extremities. Muscle tone appears normal. Sensation to touch is normal in the legs and feet.   LAB DATA:  Labs 06/02/19: HbA1c 7.7%, CBG 138  Labs 02/02/19: HbA1c 7.7%, CBG 101  Labs 01/25/19: TSH 1.87, free T4 1.3, free T3 2.6; C-peptide 1.27 (ref 0.80-3.85); CMP normal except creatinine 1.25; cholesterol 169, triglycerides 81, HDL 65, LDL 87; PTH 42 (ref 14-64), calcium 9.4 (ref 8.6-10.4); urinary microalbumin/creatinine ratio 9 (ref <30)   Labs 09/08/18: HbA1c 8.2%, CBG 123  Labs 03/24/18: HbA1c 7.7%, CBG 103  Labs 01/03/18: CBG 112 fasting  Lbs  12/02/17: sodium 134, glucose 322, creatinine 1.17  labs 11/16/17: Urine microalbumin/creatinine ratio 4.8; B12 282 (ref [id number]), folate 12.7 (ref >3.0)  Labs 11/01/17: HbA1c 6.9%, CBG 189 post-prandially; PTH 108, calcium 8.6, 25-OH vitamin D 64   Labs 09/13/17: Hepatic function panel normal; CBC normal  Labs 08/09/17: Hepatic function panel normal; CBC normal, except Hct 34.7 (35-45)  Labs 06/14/17: CBG 166  Labs 06/08/17: TSH 1.80, free T4 1.3,  free T3 2.3; CMP normal except for glucose 115 and creatinine 1.27; PTH 120, calcium 9.7, 25-OH vitamin D 34;cholesterol 188, triglycerides 110, HDL 67, LDL 100; urine microalbumin/creatinine ratio   Labs 04/12/17: HbA1c 10.0%, CBG 174  Labs 03/04/17: Sodium 131, potassium 4.0, chloride 99, CO2 20, glucose 515, calcium 8.9  Labs 01/11/17: CBG 198  Labs 11/24/16: CBG 468; urine glucose 2000+, negative ketones  Labs 11/17/16: HbA1c 13.4%; TSH 1.54; cholesterol 173, triglycerides 91, HDL 71, LDL 83; 25-OH vitamin D 21; CMP normal except for glucose 314; CBC normal except MCHC 26.5 (ref 27-33)   06/04/16: HBA1c 8.1%, CBG 115  05/27/16: TSH 2.62, free T4 1.1, free T3 2.3; CMP normal except for glucose 161, and creatinine 1.09; cholesterol 187, triglycerides 77, HDL 74, LDL 98; urinary microalbumin/creatinine ratio 6; PTH 112, calcium 9.2, 25-OH vitamin D 33  03/17/16: CBG 145  Labs 12/31/15: HbA1c 13.4%, CBG 390; CMP normal except for glucose 344 and creatinine 1.14  Labs 10/08/15: HbA1c 9.6%  Labs 07/02/15: HbA1c 8.9%; PTH 107, calcium 9.2, 25-OH vitamin D 33; CBC normal; CMP normal except for creatinine 1.07; cholesterol 174, triglycerides 91, HDL 74, LDL 82; TSH 2.43, free T4 1.2, free T3 2.3  Labs 02/19/15: PTH 99, calcium 8.7, 25-OH vitamin D 20  Labs 10/08/14: HbA1c 8.0%   Labs 08/06/14: HbA1c 7.4%  Labs 08/01/14: TSH 2.014, free T4 0.87, free T3 2.4; PTH 79 (normal 14-64), calcium 9.1; 25-OH vitamin D 35 CMP normal  Labs 05/11/14: HbA1c was  7.7%, without any low BGs; TSH 2.416, free T4 1.13, free T3 2.3  Labs 02/22/14: HbA1c 11.6%  Labs 02/13/14: Calcium 9.0, PTH 131, 25-OH vitamin D 15; CMP normal except for glucose of 165; cholesterol 177, triglycerides 108, HDL 90, LDL 65; urinary microalbumin/creatinine ratio 15.6; TSH 3.639, free T4 1.30, free T3 2.5  Labs 10/10/13: HbA1c is 9.4%, compared with 8.1% at last visit and with 9.3% in December;   Labs 05/08/13: HbA1c 8.1%; PTH 90.2, calcium 9.5, 25-hydroxy vitamin D 46; CMP normal except for glucose of 215; WBC 8.0, RBC 5.20, Hgb 12.5, Hct 37.8%, MCV 72.7 (normal 78-100), iron 55  Labs 02/23/13: CMP normal, except glucose 194 and alkaline phosphatase 126; microalbumin/creatinine ratio was 11.5; TSH 2.086, free T4 1.29, free T3 3.2, TPO antibody < 10; 25-hydroxy vitamin D 18; C-peptide 1.79; cholesterol 156, triglycerides 64, HDL 61, LDL 82  Labs 01/05/13: Hemoglobin A1c was 9.3%, compared with 9.3 at last visit and with 9.1% at the visit prior. All of these values were major increases from 6.6% in May 2012.    Labs 04/14/12: 25-hydroxy vitamin D 21, iron 203,  Vitamin B12 379,   Labs 11/16/11: Hgb 10.3, Hct 32.4%; CMP normal except glucose 146' cholesterol 201, triglycerides 70, HDL 66, LDL 121; vitamin D 22           Labs 06/09/10: Cholesterol was 175, triglycerides 76, HDL 62, LDL 98. TSH was 1.217. Free T4 was 1.10. Free T3 was 2.7. 25-hydroxy vitamin D was 19. PTH was 167.   Assessment and Plan:   ASSESSMENT:  1. Type 2 diabetes mellitus:   A. During the past three years her T2DM control has waxed and waned, with HbA1c values varying from 6.9-13.4%. In the last year, however, her HbA1c values have varied from 7.7-8.2%.    B. At her last visit her HbA1c had decreased to 7.7%. Her HbA1c is 7.7% again today.   C. At today's visit she has gained 1 pound.  D. My goal is to reduce her HbA1c to <6.5% if we can safely do so.   2. Hypoglycemia: She has not had any recognized  hypoglycemia for several months.  3. Obesity: Her weight has increased 1 pounds since her last visit. She is still doing well overall, but needs to continue to take her medications, continue to Eat Right and exercise daily.  4-5. Autonomic neuropathy and tachycardia: The patient's autonomic neuropathy and  heart rate improved as her BGs decreased, but worsened as her BGs increased. She is doing well today.      6. Hypertension: Her DBP is higher today due to not taking her medication for a week. She still needs to try to fit in more exercise.  7-8. Anhedonia and anxiety: She is doing pretty well. She still needs to call for help when she begins to do poorly. She also needs to continue to find ways to have more joy in her life. 9-12. GERD/esophagitis/difficulty swallowing/gastroparesis: Her reflux, other GI symptoms, and difficulty swallowing have all improved.   13-15: Hypocalcemia, vitamin D deficiency, and secondary hyperparathyroidism:   A. Due to her bariatric surgery she has more difficulty absorbing vitamin D and calcium. When she develops hypocalcemia and vitamin D deficiency, her parathyroid glands appropriately produce more PTH, resulting in secondary hyperparathyroidism. However, if she takes her calcium and vitamin D all three parameters will normalize.   B. Her calcium was normal in October 2018, but at the lower limit of normal in February 2019. Her vitamin D level was low-normal in May 2019.   C. In May 2019 her PTH was elevated to 120 and her calcium was 9.7. She still had secondary hyperparathyroidism. In October 2019 her calcium was at the low end of the reference range and her PTH was still elevated, but lower.  D. Her PTH level in December 2020 was mid-normal. Her calcium was at about the 45% of the reference range. She needs to take her vitamin D and calcium very consistently.  100. Noncompliance: Since last visit her compliance with eating right and taking her medications has markedly  improved. Her medication compliance suffered for one week recently. 17: Muscle cramps: Her cramps occur only infrequently now.  18. Hypothyroidism: Her TFTs in May 2019 and in December 2020 were normal.  Her current levothyroxine dosage is working well.  19. Sleeping difficulties: These problems have improved. I suspect that her increased caffeine intake was causing much of these problems.  20. Osteopenia/osteoporosis: Alexa [name]'s secondary hyperparathyroidism has normalized. She will soon repeat her BMD.   PLAN:  1. Diagnostic: I reviewed her fasting lab results from December 2020. Calcium, PTH, and vitamin D in two months.    2. Therapeutic: Continue medications as currently prescribed, to include metformin doses of 1000 mg in the mornings and 1000 mg in the evenings. Continue Synthroid at bedtime. Take calcium at mealtimes. Take [employer], 50,000 IU per week to improve her vitamin D level. Try to fit in exercise daily.  See Alexa [name] in follow up.    3. Patient education: We discussed all of the above at great length. She must continue to take her medications and supplements. She also needs to see me every two-three months. When she knows that she is coming to see "The Alexa [name]", her compliance improves.  4. Follow-up:  months   Level of Service: This visit lasted in excess of 65 minutes. More than 50% of the visit was devoted to counseling.   Tillman Sers, MD, CDE Adult  and Pediatric Endocrinology 06/02/2019 9:20 AM

## 2019-06-19 ENCOUNTER — Ambulatory Visit (INDEPENDENT_AMBULATORY_CARE_PROVIDER_SITE_OTHER): Payer: No Typology Code available for payment source | Admitting: Family Medicine

## 2019-06-19 ENCOUNTER — Other Ambulatory Visit: Payer: Self-pay

## 2019-06-19 ENCOUNTER — Encounter (INDEPENDENT_AMBULATORY_CARE_PROVIDER_SITE_OTHER): Payer: Self-pay | Admitting: Family Medicine

## 2019-06-19 VITALS — BP 117/80 | HR 89 | Temp 98.3°F | Ht 63.0 in | Wt 168.0 lb

## 2019-06-19 DIAGNOSIS — E1142 Type 2 diabetes mellitus with diabetic polyneuropathy: Secondary | ICD-10-CM | POA: Diagnosis not present

## 2019-06-19 DIAGNOSIS — Z9189 Other specified personal risk factors, not elsewhere classified: Secondary | ICD-10-CM

## 2019-06-19 DIAGNOSIS — I10 Essential (primary) hypertension: Secondary | ICD-10-CM

## 2019-06-19 DIAGNOSIS — E669 Obesity, unspecified: Secondary | ICD-10-CM | POA: Diagnosis not present

## 2019-06-19 DIAGNOSIS — E1159 Type 2 diabetes mellitus with other circulatory complications: Secondary | ICD-10-CM | POA: Diagnosis not present

## 2019-06-19 DIAGNOSIS — Z6830 Body mass index (BMI) 30.0-30.9, adult: Secondary | ICD-10-CM

## 2019-06-19 DIAGNOSIS — I152 Hypertension secondary to endocrine disorders: Secondary | ICD-10-CM

## 2019-06-19 MED ORDER — SAXENDA 18 MG/3ML ~~LOC~~ SOPN: 3.0000 mg | PEN_INJECTOR | Freq: Every day | SUBCUTANEOUS | 0 refills | Status: DC

## 2019-06-19 MED FILL — SAXENDA 18 MG/3 ML PEN: 18 | 30 days supply | Qty: 15 | Fill #0

## 2019-06-20 ENCOUNTER — Encounter (INDEPENDENT_AMBULATORY_CARE_PROVIDER_SITE_OTHER): Payer: Self-pay | Admitting: Family Medicine
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
## 2019-08-02 ENCOUNTER — Ambulatory Visit (INDEPENDENT_AMBULATORY_CARE_PROVIDER_SITE_OTHER): Payer: No Typology Code available for payment source | Admitting: Adult Health

## 2019-08-02 ENCOUNTER — Other Ambulatory Visit: Payer: Self-pay

## 2019-08-02 VITALS — BP 136/81 | HR 80 | Temp 97.8°F | Ht 63.0 in | Wt 167.0 lb

## 2019-08-02 DIAGNOSIS — E119 Type 2 diabetes mellitus without complications: Secondary | ICD-10-CM

## 2019-08-02 DIAGNOSIS — Z6830 Body mass index (BMI) 30.0-30.9, adult: Secondary | ICD-10-CM

## 2019-08-02 DIAGNOSIS — Z9189 Other specified personal risk factors, not elsewhere classified: Secondary | ICD-10-CM | POA: Diagnosis not present

## 2019-08-02 DIAGNOSIS — E669 Obesity, unspecified: Secondary | ICD-10-CM

## 2019-08-02 DIAGNOSIS — E1159 Type 2 diabetes mellitus with other circulatory complications: Secondary | ICD-10-CM

## 2019-08-02 DIAGNOSIS — I10 Essential (primary) hypertension: Secondary | ICD-10-CM

## 2019-08-02 DIAGNOSIS — Z794 Long term (current) use of insulin: Secondary | ICD-10-CM

## 2019-08-02 MED ORDER — SAXENDA 18 MG/3ML ~~LOC~~ SOPN: 3.0000 mg | PEN_INJECTOR | Freq: Every day | SUBCUTANEOUS | 0 refills | Status: DC

## 2019-08-02 MED FILL — ALPRAZolam 0.5 MG TABS: 0.5 | 30 days supply | Qty: 60 | Fill #4

## 2019-08-02 MED FILL — SAXENDA 18 MG/3 ML PEN: 18 | 30 days supply | Qty: 15 | Fill #0

## 2019-08-02 NOTE — Progress Notes (Signed)
Chief Complaint:   OBESITY Alexa [name] is here to discuss her progress with her obesity treatment plan along with follow-up of her obesity related diagnoses. Alexa [name] is practicing portion control and making smarter food choices, such as increasing vegetables and decreasing simple carbohydrates and states she is following her eating plan approximately 75% of the time. [name] states she is walking 45 minutes 3-4 times per week.  Today's visit was #: 15 Starting weight: 169 lbs Starting date: 11/16/2017 Today's weight: 167 lbs Today's date: 08/02/2019 Total lbs lost to date: 2 Total lbs lost since last in-office visit: 1  Interim History: Alexa [name] has focused on consuming protein with each meal and drinking [employer] milk more often. She has used My Fitness Pal in the past, but is not currently using. 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 She does report hunger levels are well controlled on Saxenda.  Subjective:   Controlled type 2 diabetes mellitus without complication, with long-term current use of insulin ([place]). Alexa [name] is on metformin 1000 mg BID and is followed by Endocrinology, Dr. Tobe Sos.  Ambulatory fasting blood glucose levels are in the range of 120-140. She denies episodes of hypoglycemia.  Lab Results  Component Value Date   HGBA1C 7.7 (A) 06/02/2019   HGBA1C 7.7 (A) 02/02/2019   HGBA1C 8.2 (A) 09/08/2018   Lab Results  Component Value Date   MICROALBUR 2.1 01/25/2019   LDLCALC 87 01/25/2019   CREATININE 1.25 (H) 01/25/2019   Lab Results  Component Value Date   INSULIN 8.1 11/16/2017   Hypertension associated with type 2 diabetes mellitus ([place]). Blood pressure is at goal at today's office visit. Alexa [name] is on telmisartan 40 mg 1/2 tab BID. She denies cardiac symptoms.  BP Readings from Last 3 Encounters:  08/02/19 136/81  06/19/19 117/80  06/02/19 118/80   Lab Results  Component Value Date   CREATININE 1.25 (H) 01/25/2019   CREATININE  1.17 (H) 12/02/2017   CREATININE 1.27 (H) 06/08/2017   At risk for dehydration. Alexa [name] is at increased risk for dehydration due to decreased water intake and was encouraged to increase her water intake. Last creatinine level was elevated.  Assessment/Plan:   Controlled type 2 diabetes mellitus without complication, with long-term current use of insulin ([place]). Good blood sugar control is important to decrease the likelihood of diabetic complications such as nephropathy, neuropathy, limb loss, blindness, coronary artery disease, and death. Intensive lifestyle modification including diet, exercise and weight loss are the first line of treatment for diabetes. [name] will continue her current anti-diabetic prescriptions as directed and will continue follow-up as directed and as scheduled.  Hypertension associated with type 2 diabetes mellitus ([place]). Elysia is working on healthy weight loss and exercise to improve blood pressure control. We will watch for signs of hypotension as she continues her lifestyle modifications. She will continue her current ARB as directed and will increase her water intake.  At risk for dehydration. Alexa [name] was given approximately 15 minutes dehydration prevention counseling today. Alexa [name] is at risk for dehydration due to weight loss and current medication(s). She was encouraged to hydrate and monitor fluid status to avoid dehydration as well as weight loss plateaus.   Class 1 obesity with serious comorbidity and body mass index (BMI) of 30.0 to 30.9 in adult, unspecified obesity type - starting BMI greater than 30. Refill was given for Saxenda 3.0 mg daily #5 pens with 0 refills.  Alexa [name] is currently in the action stage of change.  As such, her goal is to continue with weight loss efforts. She has agreed to practicing portion control and making smarter food choices, such as increasing vegetables and decreasing simple carbohydrates with 85+ grams of protein.  She was encouraged  to use My Fitness Pal and was encouraged to pack snack/lunch when working.  Handout was provided on Additional Breakfast Options.  Exercise goals: Alexa [name] will continue her current exercise regimen.   Behavioral modification strategies: increasing lean protein intake, increasing water intake, meal planning and cooking strategies, better snacking choices and planning for success.  [name] has agreed to follow-up with our clinic in 4 weeks. She was informed of the importance of frequent follow-up visits to maximize her success with intensive lifestyle modifications for her multiple health conditions.   Objective:   Blood pressure 136/81, pulse 80, temperature 97.8 F (36.6 C), temperature source Oral, height 5\' 3"  (1.6 m), weight 167 lb (75.8 kg), last menstrual period 05/11/2010, SpO2 99 %. Body mass index is 29.58 kg/m.  General: Cooperative, alert, well developed, in no acute distress. HEENT: Conjunctivae and lids unremarkable. Cardiovascular: Regular rhythm.  Lungs: Normal work of breathing. Neurologic: No focal deficits.   Lab Results  Component Value Date   CREATININE 1.25 (H) 01/25/2019   BUN 20 01/25/2019   NA 139 01/25/2019   K 4.7 01/25/2019   CL 103 01/25/2019   CO2 26 01/25/2019   Lab Results  Component Value Date   ALT 10 03/27/2019   AST 13 03/27/2019   ALKPHOS 85 05/27/2016   BILITOT 0.4 03/27/2019   Lab Results  Component Value Date   HGBA1C 7.7 (A) 06/02/2019   HGBA1C 7.7 (A) 02/02/2019   HGBA1C 8.2 (A) 09/08/2018   HGBA1C 7.7 (A) 03/24/2018   HGBA1C 6.9 (A) 11/01/2017   Lab Results  Component Value Date   INSULIN 8.1 11/16/2017   Lab Results  Component Value Date   TSH 1.87 01/25/2019   Lab Results  Component Value Date   CHOL 169 01/25/2019   HDL 65 01/25/2019   LDLCALC 87 01/25/2019   TRIG 81 01/25/2019   CHOLHDL 2.6 01/25/2019   Lab Results  Component Value Date   WBC 5.5 03/27/2019   HGB 12.1 03/27/2019   HCT 36.8 03/27/2019   MCV  84.8 03/27/2019   PLT 316 03/27/2019   Lab Results  Component Value Date   IRON 55 05/08/2013   TIBC 357 04/14/2012   Attestation Statements:   Reviewed by clinician on day of visit: allergies, medications, problem list, medical history, surgical history, family history, social history, and previous encounter notes.  I, Michaelene Song, am acting as Location manager for [employer], NP-C   I have reviewed the above documentation for accuracy and completeness, and I agree with the above. -  Esaw Grandchild, NP

## 2019-08-03 ENCOUNTER — Other Ambulatory Visit (INDEPENDENT_AMBULATORY_CARE_PROVIDER_SITE_OTHER): Payer: Self-pay | Admitting: "Endocrinology

## 2019-08-03 DIAGNOSIS — IMO0002 Reserved for concepts with insufficient information to code with codable children: Secondary | ICD-10-CM

## 2019-08-03 MED FILL — metFORMIN HCL 1000 MG TABS: 1000 | 90 days supply | Qty: 180 | Fill #0

## 2019-08-11 ENCOUNTER — Encounter: Payer: Self-pay | Admitting: Gastroenterology

## 2019-08-14 ENCOUNTER — Telehealth: Payer: Self-pay | Admitting: Internal Medicine

## 2019-08-14 NOTE — Telephone Encounter (Signed)
Alexa [name] called on 08/11/2019 to say she had appointments with [facility] and with Dr Elijah Birk III GI doctor for insurance purposes. Because she has [facility] plan.

## 2019-08-29 ENCOUNTER — Encounter (INDEPENDENT_AMBULATORY_CARE_PROVIDER_SITE_OTHER): Payer: Self-pay

## 2019-08-29 MED FILL — TELMISARTAN 40 MG TABS: 40 | 90 days supply | Qty: 90 | Fill #2

## 2019-08-29 MED FILL — PANTOPRAZOLE SOD DR 40 MG T: 40 | 90 days supply | Qty: 90 | Fill #2

## 2019-08-29 MED FILL — CARVEDILOL 3.125 MG TABLET: 3.125 | 60 days supply | Qty: 120 | Fill #3

## 2019-08-30 MED FILL — ALPRAZolam 0.5 MG TABS: 0.5 | 30 days supply | Qty: 60 | Fill #5

## 2019-09-07 ENCOUNTER — Other Ambulatory Visit: Payer: Self-pay | Admitting: Internal Medicine

## 2019-09-07 ENCOUNTER — Other Ambulatory Visit (HOSPITAL_COMMUNITY): Payer: Self-pay | Admitting: Internal Medicine

## 2019-09-07 DIAGNOSIS — N1831 Chronic kidney disease, stage 3a: Secondary | ICD-10-CM

## 2019-09-07 LAB — VITAMIN D 1,25 DIHYDROXY
Vitamin D 1, 25 (OH)2 Total: 42 pg/mL (ref 18–72)
Vitamin D2 1, 25 (OH)2: <8 pg/mL
Vitamin D3 1, 25 (OH)2: 42 pg/mL

## 2019-09-07 LAB — PTH, INTACT AND CALCIUM
Calcium: 8.9 mg/dL (ref 8.6–10.4)
PTH: 40 pg/mL (ref 14–64)

## 2019-09-08 ENCOUNTER — Encounter (INDEPENDENT_AMBULATORY_CARE_PROVIDER_SITE_OTHER): Payer: Self-pay | Admitting: "Endocrinology

## 2019-09-08 ENCOUNTER — Ambulatory Visit
Admission: RE | Admit: 2019-09-08 | Discharge: 2019-09-08 | Disposition: A | Discharge status: Home / Self Care | Payer: No Typology Code available for payment source | Source: Ambulatory Visit | Attending: "Endocrinology | Admitting: "Endocrinology

## 2019-09-08 ENCOUNTER — Ambulatory Visit: Payer: No Typology Code available for payment source | Admitting: *Deleted

## 2019-09-08 ENCOUNTER — Other Ambulatory Visit: Payer: Self-pay

## 2019-09-08 ENCOUNTER — Ambulatory Visit (INDEPENDENT_AMBULATORY_CARE_PROVIDER_SITE_OTHER): Payer: No Typology Code available for payment source | Admitting: "Endocrinology

## 2019-09-08 VITALS — BP 116/72 | HR 88 | Wt 171.4 lb

## 2019-09-08 DIAGNOSIS — I10 Essential (primary) hypertension: Secondary | ICD-10-CM | POA: Diagnosis not present

## 2019-09-08 DIAGNOSIS — E063 Autoimmune thyroiditis: Secondary | ICD-10-CM

## 2019-09-08 DIAGNOSIS — E11649 Type 2 diabetes mellitus with hypoglycemia without coma: Secondary | ICD-10-CM | POA: Diagnosis not present

## 2019-09-08 DIAGNOSIS — E1140 Type 2 diabetes mellitus with diabetic neuropathy, unspecified: Secondary | ICD-10-CM

## 2019-09-08 DIAGNOSIS — E211 Secondary hyperparathyroidism, not elsewhere classified: Secondary | ICD-10-CM

## 2019-09-08 DIAGNOSIS — M8580 Other specified disorders of bone density and structure, unspecified site: Secondary | ICD-10-CM

## 2019-09-08 DIAGNOSIS — R4584 Anhedonia: Secondary | ICD-10-CM

## 2019-09-08 DIAGNOSIS — B351 Tinea unguium: Secondary | ICD-10-CM

## 2019-09-08 DIAGNOSIS — E559 Vitamin D deficiency, unspecified: Secondary | ICD-10-CM

## 2019-09-08 DIAGNOSIS — E1142 Type 2 diabetes mellitus with diabetic polyneuropathy: Secondary | ICD-10-CM

## 2019-09-08 DIAGNOSIS — K219 Gastro-esophageal reflux disease without esophagitis: Secondary | ICD-10-CM

## 2019-09-08 DIAGNOSIS — E1143 Type 2 diabetes mellitus with diabetic autonomic (poly)neuropathy: Secondary | ICD-10-CM

## 2019-09-08 DIAGNOSIS — Z780 Asymptomatic menopausal state: Secondary | ICD-10-CM

## 2019-09-08 LAB — POCT GLYCOSYLATED HEMOGLOBIN (HGB A1C): Hemoglobin A1C: 7.2 % — AB (ref 4.0–5.6)

## 2019-09-08 LAB — POCT GLUCOSE (DEVICE FOR HOME USE): Glucose Fasting, POC: 98 mg/dL (ref 70–99)

## 2019-09-08 NOTE — Progress Notes (Signed)
Patient presents today for the 1st (2nd round) laser treatment. Diagnosed with mycotic nail infection by Dr. Jacqualyn Posey.   Toenail most affected the hallux bilateral.  All other systems are negative.  Nails were filed thin. Laser therapy was administered to 1st toenails bilateral and patient tolerated the treatment well. All safety precautions were in place.   Patient has taken a 90 days of terbinafine (completed in May 2021) and she also has a prescription for Jublia that she has not started using yet.   Follow up in 8 weeks for laser # 2.  Picture of nails taken today to document visual progress

## 2019-09-08 NOTE — Patient Instructions (Signed)
Follow up visit in 3 months. Please repeat fasting lab tests 1-2 weeks prior.  

## 2019-09-08 NOTE — Progress Notes (Signed)
Subjective:  Patient Name: Alexa [name] Date of Birth: 05-23-1960  MRN: [id number]  Alexa [name]  presents to the office today for follow-up of her type 2 diabetes mellitus, obesity, combined hyperlipidemia, GERD, hypertension, ASHD, dyspepsia, pedal edema, non-proliferative diabetic retinopathy, goiter, depression, autonomic neuropathy, tachycardia, peripheral neuropathy, acquired hypothyroidism, vitamin D deficiency, hypocalcemia, secondary hyperparathyroidism, glossitis, pallor, fatigue, iron deficiency anemia, anhedonia, disinclination to take medicines, and status post Roux-en-Y gastric bypass.   HISTORY OF PRESENT ILLNESS:   Alexa [name] is a 59 y.o. Caucasian woman. Alexa [name] was unaccompanied.  1. The patient first presented to me on 08/20/04 in referral from her primary care internist, Dr. Emeline General, for evaluation and management of her type 2 diabetes, obesity, and multiple medical issues. She was 59 years old.   [name] had a long history of obesity. She was heavy as a child. She underwent menarche at age 59. At age 71 she was 180 pounds. In 1996 she weighed 218 pounds. In 1999 she was diagnosed with type 2 diabetes mellitus. Her weight at that point was 250 pounds. She was treated with Glucophage, and Actos. Actos made her gain even more weight. She had also been treated with glipizide in the past. More recently she had been treated with Lantus and Glucophage plus regular insulin as needed, especially for when she took steroids for asthma attacks. Maximum weight had been 296 pounds one month prior to that first visit with me. Her tendency to gain weight and her difficulty in losing weight were aggravated by long-standing, intermittent depression and by severe, recurrent asthma requiring the use of steroid medications.  B. Past medical history was also positive for a 58% blockage in one of her coronary arteries. She had significant issues with GERD and dyspepsia. She also had combined  hyperlipidemia. She had a previous cardiac catheterization and previous tonsillectomy. She was allergic to theophylline. Her pertinent review of systems was positive for some numbness and tingling in her feet. Family history was positive for type 2 diabetes in her father and her maternal grandmother. Her brother weighed 250 pounds at a height of 76 inches. Both her mother and her sister were hypothyroid.  C. On physical examination, her weight was 279.9 pounds. Her BMI was 49.6. Her blood pressure was 142/86. Her heart rate was 96. Her hemoglobin A1c was 9.8%. She was alert and oriented x3. Her affect was normal. Her insight was fair. She was obviously quite obese. She had a 25 gm thyroid gland. She had 1+ tremor of her hands. She had 1+ DP pulses and 2+ tinea pedis. Sensation to touch was intact in her feet. Laboratory data included a normal CMP. Her cholesterol was 175, triglycerides 76, HDL 53, and LDL 107. Her TSH was 2.98. Since she obviously did have type 2 diabetes mellitus associated with morbid obesity, and since weight loss was a major factor for her, I asked her to resume her metformin twice daily. I also started her on Byetta, initially 5 mcg twice daily and later 10 mcg twice daily. I discontinued her Lantus insulin.  2. During the last 15 years,we have had some successes, some failures, and some new problem areas.  A. T2DM: In October 2007 the combination of Byetta and metformin was causing more gastrointestinal problems. She opted to stop the metformin and continue the Byetta because it was helping her with weight control and blood sugar control. By 08/11/06 her weight had decreased to 267.6 pounds. Hemoglobin A1c was 8.6%. At that point  she decided to have bariatric surgery. She had a Roux-en-Y gastric bypass on 12/20/2006. She subsequently lost weight down to 58.4 pounds on 05/23/08, but then subsequently regained weight to the 190s. Her  hemoglobin A1c values varied in parallel with her weights.  On 05/23/08, at the point of her lowest weight, her hemoglobin A1c dropped to a nadir of 6.3%.  Since then her hemoglobin A1c values have varied between 6.6 and 13.4%. Although we were initially able to stop all of her diabetes medicines after bariatric surgery, when she began to regain weight we restarted metformin, 500 mg twice daily. Although she took her metformin twice daily for a long time, she had discontinued it many years ago. She hated to take medicines and could not make herself be compliant in taking medications or exercising. When her HbA1c increased to 9.4% in September 2015, I stopped her Byetta injections and started her on liraglutide (Victoza) daily injections on 11/16/13. After several more years of noncompliance, she has been doing much better in the past 2 years.   B. Vitamin D deficiency, hypocalcemia, and secondary hyperparathyroidism: Just before her gastric bypass, I obtained baseline bone mineral metabolism studies. Her 25-hydroxy vitamin D was very low at 7 (normal greater than or equal to 30). Her calcium was 9.5 (normal 8.6-10.6). Her parathyroid hormone was 38.4 (normal 14-72). Subsequent to her surgery, the patient was supposed to be taking multivitamins with calcium and vitamin D, but did not always do so. On 03/29/2007 her 25-hydroxy vitamin D was 29, but her calcium decreased to 8.7. Her PTH was slightly elevated at 73.5. Her 1,25-hydroxy vitamin D was 46. Her iron was 56. I asked her to make sure she took her multivitamins and calcium daily. Unfortunately, she has often been non-compliant with her multivitamins and calcium. Her 25-hydroxy vitamin D values have varied between 15-35. Her calcium values have varied between 8.1-9.4. Her PTH values have remained elevated between 79-167. Alexa [name]'s iron levels have also been low, between 32-34. She was supposed to be taking iron every day, but frequently did not do so. In the past year, however, she has been doing better at taking her  vitamin D, calcium, and iron. In May 2019 her vitamin D was at the lower end of the reference range, her calcium was about the 58% of the reference range, but her PTH was still significantly elevated. In December 2020 her PTH and calcium were mid-normal.   C. Alexa [name] has had psychological issues for many years that have adversely affected her eating, her unwillingness to exercise, her noncompliance with taking medications, her weight, and her T2DM care. Fortunately, her recent outpatient psychological therapy with Ms. Rea College, RN, MS and her taking Lexapro regularly have resulted in Alexa [name] having a much more positive attitude about life in general and a generally more positive attitude about taking her medications. Fortunately, Alexa [name]'s has been dealing successfully with her issues and her compliance has markedly improved.   D. Obesity: Although Alexa [name] regained a great deal of weight in the years following her bariatric surgery, in the past year she has been taking Contrave and has been able to lose all of the weight that she had gained after her bariatric surgery.   3. Alexa [name]'s last [facility] visit was on 06/02/19. At that visit I asked her to continue her morning dose of metformin of 1000 mg and continue the evening dose of 1000 mg. I continued her Synthroid dose at bedtime. I asked her to take one Biotech, 50,000 IU  capsule of vitamin D weekly and calcium at each meal.   A. She has been healthy. She had been taking all of her medications more consistently. She is tired due to working too hard.  B. Dr. Renold Genta referred her to nephrology, Dr. Joylene Grapes. Her serum creatinine decreased to 0.92. She was diagnosed with stage 3A renal disease. She was scheduled for a renal US.   C. Her husband has heart failure. He is doing much better with his bipap machine.   D. She continues to have some shakiness of her hands that has been diagnosed as familial tremor.   E. She has been sleeping better with Xanax at bedtime.  She is no longer drinking regular Coke very often. She does drink coffee on some mornings and tea at times.   F. She has lost weight after taking 3 mg Saxenda (liraglutide) daily. She is followed at [facility].    Alexa [name] remains in counseling with Ms. Showfety every month.    H. She has not had much of a problem with allergies or asthma this year.    I. She still has some hot flashes, but not very often.   J. She has not been bothered by pains in her anterior shins, but does have occasional calf cramps.    K. 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   L. She is taking Lexapro, 20 mg/day; Synthroid, 25 mcg/day; Micardis, 20 mg, twice daily; Saxenda, 3 mg/day; Protonix, 40 mg/day; metformin 1000 mg on the morning and 1000 mg in the evening. She inadvertently has been taking only one 200 mg Citracal tablet thrice daily, instead of the 800 mg twice daily that she was taking previously. She is also taking Coreg, 3.125 mg, twice daily. She is also taking B12 daily, Biotech once weekly. She is not taking oral fungal treatment for her tinea pedis.   M. She is still working at not allowing herself to become over-committed. She is trying to achieve more balance.    4. Review of Systems: There are no other significant issues  Constitutional: "I feel tired. Mentally I feel good." Marital relations are going very well.   Eyes: Her eyes sometimes hurt. She is also sensitive to light at times. Vision has not been as good. Her last eye exam occurred in the Fall of 2020. No diabetic eye disease was noted. She has some macular edema in her left eye, but this finding had not changed over time.  Neck: She occasionally has trouble swallowing, but not often. The patient has no complaints of anterior neck swelling, soreness, tenderness, pressure, or discomfort.  Heart: She had chest pain twice in the past week. She has a follow up appointment on  09/22/19 with her cardiologist. Heart rate increases with exercise or other physical activity. She has no complaints of palpitations or irregular heat beats Gastrointestinal: She has nausea occasionally, but "not too bad'. She rarely  has the sensation of stomach pains. She still occasionally has post-prandial bloating if she eats too much. She always has constipation. She occasionally has head hunger and belly hunger. She is not having acid reflux, upset stomach, stomach aches or pains, swallowing difficulties, diarrhea.  Legs: As above. There are no complaints of numbness, tingling, or burning. No edema is noted. Feet: There are no complaints of numbness, tingling, burning, or pain. No edema is noted. Tinea improved with her new laser therapy.   Neuro: No new  sensory or muscular problems Psych: She is doing pretty well, considering her husband's recent health problems..    GU: She has not been urinating a lot. She has not had much nocturia.   GYN: LMP was on 05/11/2010. She had a pap smear in 2020.  Hypoglycemia: None   5. BG printout: She is not checking BGs.  PAST MEDICAL, FAMILY, AND SOCIAL HISTORY:  Past Medical History:  Diagnosis Date  . Anemia, iron deficiency   . Anhedonia   . Anxiety   . Asthma   . Asthma, chronic   . Back pain   . CAD (coronary artery disease)   . Chewing difficulty   . Chronic insomnia   . Combined hyperlipidemia   . Constipation   . Depression   . Diabetes mellitus   . Diabetes mellitus type II   . Diabetic autonomic neuropathy ([place])   . Difficulty swallowing pills   . DM type 2 with diabetic peripheral neuropathy ([place])   . Dry mouth   . Dyspepsia   . Dyspnea   . Elevated homocysteine   . Essential tremor   . Fatigue   . Fatty liver   . GERD (gastroesophageal reflux disease)   . GERD (gastroesophageal reflux disease)   . Glossitis   . Goiter   . Hemorrhoids   . HLD (hyperlipidemia)   . Hyperparathyroidism , secondary, non-renal ([place])   .  Hypertension   . Hypocalcemia   . Insomnia   . Leg edema   . Leg pain   . Macular degeneration   . Nonproliferative diabetic retinopathy associated with type 2 diabetes mellitus ([place])    RESOLVED  . Obesity, Class III, BMI 40-49.9 (morbid obesity) ([place])   . Osteopenia   . Osteoporosis   . Pallor   . Status post gastric surgery   . Tachycardia   . Thyroiditis, autoimmune   . Vaginal dryness, menopausal   . Vitamin D deficiency     Family History  Problem Relation Age of Onset  . Thyroid disease Mother        Hypothyroid  . Depression Mother   . Liver disease Mother   . Obesity Mother   . Diabetes Father        T2 DM  . Hypertension Father   . Hyperlipidemia Father   . Heart disease Father   . Obesity Brother        250 pounds at a height of 76 inches.  . Thyroid disease Sister        Hypothyroid  . Diabetes Maternal Grandmother   . Colon cancer Other        great grandfather  . Liver disease Maternal Aunt        NASH  . Esophageal cancer Neg Hx   . Pancreatic cancer Neg Hx   . Kidney disease Neg Hx      Current Outpatient Medications:  .  albuterol (PROVENTIL) (2.5 MG/3ML) 0.083% nebulizer solution, Take 3 mLs (2.5 mg total) by nebulization every 6 (six) hours as needed for wheezing or shortness of breath., Disp: 150 mL, Rfl: 1 .  albuterol (VENTOLIN HFA) 108 (90 Base) MCG/ACT inhaler, Inhale 2 puffs into the lungs every 6 (six) hours as needed for wheezing or shortness of breath., Disp: 6.7 g, Rfl: 11 .  ALPRAZolam (XANAX) 0.5 MG tablet, One half to one tab po twice daily prn anxiety, Disp: 60 tablet, Rfl: 5 .  Blood Glucose Monitoring Suppl (FREESTYLE LITE) DEVI, Use to check glucose  3x daily, Disp: 2 each, Rfl: 5 .  calcium-vitamin D (OSCAL WITH D) 500-200 MG-UNIT TABS tablet, Take by mouth., Disp: , Rfl:  .  conjugated estrogens (PREMARIN) vaginal cream, Place 1 Applicatorful vaginally at bedtime. Use for 21 days then off for 7 days, Disp: 42.5 g, Rfl: 11 .   Cyanocobalamin (VITAMIN B 12) 100 MCG LOZG, Take by mouth., Disp: , Rfl:  .  cyclobenzaprine (FLEXERIL) 10 MG tablet, Take 1 tablet (10 mg total) by mouth 3 (three) times daily as needed for muscle spasms., Disp: 90 tablet, Rfl: 0 .  diphenhydrAMINE (SOMINEX) 25 MG tablet, Take by mouth., Disp: , Rfl:  .  Efinaconazole 10 % SOLN, Apply 1 drop topically daily., Disp: 4 mL, Rfl: 11 .  escitalopram (LEXAPRO) 20 MG tablet, Take 1 tablet (20 mg total) by mouth daily., Disp: 90 tablet, Rfl: 1 .  Ferrous Sulfate (SLOW FE PO), Take 1 tablet by mouth daily. , Disp: , Rfl:  .  glucose blood (FREESTYLE LITE) test strip, Use to check glucose 3x daily, Disp: 300 each, Rfl: 1 .  ibuprofen (ADVIL,MOTRIN) 200 MG tablet, Take 600 mg by mouth daily as needed for headache or moderate pain., Disp: , Rfl:  .  Insulin Pen Needle 32G X 4 MM MISC, Use with Saxenda daily, Disp: 100 each, Rfl: 0 .  levothyroxine (SYNTHROID) 25 MCG tablet, TAKE 1 TABLET BY MOUTH ONCE DAILY, Disp: 90 tablet, Rfl: 1 .  Liraglutide -Weight Management (SAXENDA) 18 MG/3ML SOPN, Inject 0.5 mLs (3 mg total) into the skin daily., Disp: 5 pen, Rfl: 0 .  metFORMIN (GLUCOPHAGE) 1000 MG tablet, TAKE 1 TABLET BY MOUTH 2 TIMES DAILY WITH A MEAL., Disp: 180 tablet, Rfl: 0 .  Multiple Vitamin (MULTIVITAMIN) tablet, Take 1 tablet by mouth daily. Reported on 02/26/2015, Disp: 30 tablet, Rfl: 0 .  ondansetron (ZOFRAN) 4 MG tablet, Take 1 tablet (4 mg total) by mouth every 8 (eight) hours as needed for nausea or vomiting., Disp: 20 tablet, Rfl: 0 .  pantoprazole (PROTONIX) 40 MG tablet, Take 1 tablet (40 mg total) by mouth daily., Disp: 90 tablet, Rfl: 4 .  telmisartan (MICARDIS) 40 MG tablet, Take 0.5 tablets (20 mg total) by mouth 2 (two) times daily., Disp: 90 tablet, Rfl: 3 .  terbinafine (LAMISIL) 250 MG tablet, Take 1 tablet (250 mg total) by mouth daily., Disp: 90 tablet, Rfl: 0 .  traMADol (ULTRAM) 50 MG tablet, Take 1 tablet (50 mg total) by mouth daily  as needed for moderate pain or severe pain., Disp: 30 tablet, Rfl: 1 .  Vitamin D, Ergocalciferol, (DRISDOL) 50000 units CAPS capsule, Take 50,000 Units by mouth every Sunday., Disp: , Rfl:  .  COREG 3.125 MG tablet, Take 1 tablet (3.125 mg total) by mouth 2 (two) times daily with a meal., Disp: 60 tablet, Rfl: 11  Allergies as of 09/08/2019 - Review Complete 08/02/2019  Allergen Reaction Noted  . Theophyllines Palpitations 05/13/2010    1. 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  2. Activities: She has not been walking for exercise.     3. Smoking, alcohol, or drugs: None 4. Primary Care Provider: Dr. Tommie Ard Baxley 5. Therapist: Ms. Rea College, MS 6. Bariatrician: Dr. Redgie Grayer, MD  REVIEW OF SYSTEM: There are no other significant problems involving Taijah's other body systems.   Objective:  Vital Signs: BP 116/72   Pulse 88   Wt 171 lb 6.4 oz (77.7 kg)   LMP 05/11/2010   BMI 30.36 kg/m    Wt Readings from Last 3 Encounters:  09/08/19 171 lb 6.4 oz (77.7 kg)  08/02/19 167 lb (75.8 kg)  06/19/19 168 lb (76.2 kg)    Ht Readings from Last 3 Encounters:  08/02/19 5\' 3"  (1.6 m)  06/19/19 5\' 3"  (1.6 m)  06/02/19 5' 3.5" (1.613 m)   Body mass index is 30.36 kg/m. Facility age limit for growth percentiles is 20 years.  Body surface area is 1.86 meters squared.   PHYSICAL EXAM:  Constitutional:  Alexa [name] looks good today. She is alert and bright. Her affect and insight are very good. She is wearing a dress and sandals today. She has gained 4 pounds since her last visit. Her weight is still slightly  below the post-gastric bypass nadir of 173.4 pounds that it was in 2010. She is alert and bright. Her affect and insight are normal.  Eyes: There is no arcus or proptosis.  Mouth: The oropharynx appears normal. The tongue appears normal. There is  normal oral moisture. There is no obvious gingivitis. Neck: There are no bruits present. The thyroid gland appears normal in size. The thyroid gland is again within normal limits at 18-20 grams in size. The consistency of the thyroid gland is normal. There is no thyroid tenderness to palpation. Lungs: The lungs are clear. Air movement is good. Heart: The heart rhythm and rate appear normal. Heart sounds S1 and S2 are normal. I do not appreciate any pathologic heart murmurs. Abdomen: The abdomen is enlarged. Bowel sounds are normal. The abdomen is soft and non-tender. There is no obviously palpable hepatomegaly, splenomegaly, or other masses.  Arms: Muscle mass appears appropriate for age.  Hands: She has a 1+ tremor. Phalangeal and metacarpophalangeal joints appear normal. Palms are normal. Legs: Muscle mass appears appropriate for age. There is no edema. Her shins are not tender to touch today.  Feet: There are no significant deformities. Dorsalis pedis pulses are normal 1+ on the right and 2+ on the left. She has much less visible tinea in her great toenails. Her left great toenail is healing. Neurologic: Muscle strength is normal for age and gender  in both the upper and the lower extremities. Muscle tone appears normal. Sensation to touch is normal in the legs and feet.   LAB DATA:  Labs 09/08/19: HbA1c 7.2%, CBG 98  Labs 09/04/19: PTH 40, calcium 8.9, 25-OH vitamin D 42  Labs 06/02/19: HbA1c 7.7%, CBG 138  Labs 02/02/19: HbA1c 7.7%, CBG 101  Labs 01/25/19: TSH 1.87, free T4 1.3, free T3 2.6; C-peptide 1.27 (ref 0.80-3.85); CMP normal except creatinine 1.25; cholesterol 169, triglycerides 81, HDL 65, LDL 87; PTH 42 (ref 14-64), calcium 9.4 (ref 8.6-10.4); urinary microalbumin/creatinine ratio 9 (ref <30)   Labs 09/08/18: HbA1c 8.2%, CBG 123  Labs 03/24/18: HbA1c 7.7%, CBG 103  Labs 01/03/18: CBG 112 fasting  Lbs 12/02/17: sodium 134, glucose 322, creatinine 1.17  labs 11/16/17: Urine  microalbumin/creatinine ratio 4.8; B12 282 (ref [phone]), folate 12.7 (ref >3.0)  Labs 11/01/17: HbA1c 6.9%, CBG 189 post-prandially; PTH 108, calcium 8.6, 25-OH vitamin D 64   Labs 09/13/17: Hepatic function panel normal; CBC normal  Labs 08/09/17: Hepatic function panel normal; CBC normal, except Hct 34.7 (35-45)  Labs 06/14/17: CBG 166  Labs 06/08/17: TSH 1.80, free T4 1.3, free T3 2.3; CMP normal except for glucose  115 and creatinine 1.27; PTH 120, calcium 9.7, 25-OH vitamin D 34;cholesterol 188, triglycerides 110, HDL 67, LDL 100; urine microalbumin/creatinine ratio   Labs 04/12/17: HbA1c 10.0%, CBG 174  Labs 03/04/17: Sodium 131, potassium 4.0, chloride 99, CO2 20, glucose 515, calcium 8.9  Labs 01/11/17: CBG 198  Labs 11/24/16: CBG 468; urine glucose 2000+, negative ketones  Labs 11/17/16: HbA1c 13.4%; TSH 1.54; cholesterol 173, triglycerides 91, HDL 71, LDL 83; 25-OH vitamin D 21; CMP normal except for glucose 314; CBC normal except MCHC 26.5 (ref 27-33)   06/04/16: HBA1c 8.1%, CBG 115  05/27/16: TSH 2.62, free T4 1.1, free T3 2.3; CMP normal except for glucose 161, and creatinine 1.09; cholesterol 187, triglycerides 77, HDL 74, LDL 98; urinary microalbumin/creatinine ratio 6; PTH 112, calcium 9.2, 25-OH vitamin D 33  03/17/16: CBG 145  Labs 12/31/15: HbA1c 13.4%, CBG 390; CMP normal except for glucose 344 and creatinine 1.14  Labs 10/08/15: HbA1c 9.6%  Labs 07/02/15: HbA1c 8.9%; PTH 107, calcium 9.2, 25-OH vitamin D 33; CBC normal; CMP normal except for creatinine 1.07; cholesterol 174, triglycerides 91, HDL 74, LDL 82; TSH 2.43, free T4 1.2, free T3 2.3  Labs 02/19/15: PTH 99, calcium 8.7, 25-OH vitamin D 20  Labs 10/08/14: HbA1c 8.0%   Labs 08/06/14: HbA1c 7.4%  Labs 08/01/14: TSH 2.014, free T4 0.87, free T3 2.4; PTH 79 (normal 14-64), calcium 9.1; 25-OH vitamin D 35 CMP normal  Labs 05/11/14: HbA1c was 7.7%, without any low BGs; TSH 2.416, free T4 1.13, free T3 2.3  Labs  02/22/14: HbA1c 11.6%  Labs 02/13/14: Calcium 9.0, PTH 131, 25-OH vitamin D 15; CMP normal except for glucose of 165; cholesterol 177, triglycerides 108, HDL 90, LDL 65; urinary microalbumin/creatinine ratio 15.6; TSH 3.639, free T4 1.30, free T3 2.5  Labs 10/10/13: HbA1c is 9.4%, compared with 8.1% at last visit and with 9.3% in December;   Labs 05/08/13: HbA1c 8.1%; PTH 90.2, calcium 9.5, 25-hydroxy vitamin D 46; CMP normal except for glucose of 215; WBC 8.0, RBC 5.20, Hgb 12.5, Hct 37.8%, MCV 72.7 (normal 78-100), iron 55  Labs 02/23/13: CMP normal, except glucose 194 and alkaline phosphatase 126; microalbumin/creatinine ratio was 11.5; TSH 2.086, free T4 1.29, free T3 3.2, TPO antibody < 10; 25-hydroxy vitamin D 18; C-peptide 1.79; cholesterol 156, triglycerides 64, HDL 61, LDL 82  Labs 01/05/13: Hemoglobin A1c was 9.3%, compared with 9.3 at last visit and with 9.1% at the visit prior. All of these values were major increases from 6.6% in May 2012.    Labs 04/14/12: 25-hydroxy vitamin D 21, iron 203,  Vitamin B12 379,   Labs 11/16/11: Hgb 10.3, Hct 32.4%; CMP normal except glucose 146' cholesterol 201, triglycerides 70, HDL 66, LDL 121; vitamin D 22           Labs 06/09/10: Cholesterol was 175, triglycerides 76, HDL 62, LDL 98. TSH was 1.217. Free T4 was 1.10. Free T3 was 2.7. 25-hydroxy vitamin D was 19. PTH was 167.  IMAGING:  BMD 09/08/19: pending   Assessment and Plan:   ASSESSMENT:  1. Type 2 diabetes mellitus:   A. During the past three years her T2DM control has waxed and waned, with HbA1c values varying from 6.9-13.4%. In the last year, however, her HbA1c values have varied from 7.7-8.2%.    B. At her last visit her HbA1c had decreased to 7.7%. Her HbA1c decreased to 7.2% today. This is her lowest HbA1c value in the past 7 years.  C. At today's  visit she has gained 4 pounds.    D. My goal is to reduce her HbA1c to <6.5% if we can safely do so.   2. Hypoglycemia: She has not had any  recognized hypoglycemia for many months.  3. Obesity: Her weight has increased 4 pounds since her last visit. She is still doing well overall, but needs to continue to take her medications, continue to Eat Right and exercise daily.  4-5. Autonomic neuropathy and tachycardia: The patient's autonomic neuropathy and  heart rate improved as her BGs decreased, but worsened as her BGs increased. She is doing well today.      6. Hypertension: Her BP is normal today. She still needs to try to fit in more exercise.  7-8. Anhedonia and anxiety: She is doing well. She still needs to call for help when she begins to do poorly. She also needs to continue to find ways to have more joy in her life. 9-12. GERD/esophagitis/difficulty swallowing/gastroparesis: Her reflux, other GI symptoms, and difficulty swallowing have all improved.   13-15: Hypocalcemia, vitamin D deficiency, and secondary hyperparathyroidism:   A. Due to her bariatric surgery she has more difficulty absorbing vitamin D and calcium. When she develops hypocalcemia and vitamin D deficiency, her parathyroid glands appropriately produce more PTH, resulting in secondary hyperparathyroidism. However, if she takes her calcium and vitamin D all three parameters will normalize.   B. Her calcium was normal in October 2018, but at the lower limit of normal in February 2019. Her vitamin D level was low-normal in May 2019.   C. In May 2019 her PTH was elevated to 120 and her calcium was 9.7. She still had secondary hyperparathyroidism. In October 2019 her calcium was at the low end of the reference range and her PTH was still elevated, but lower.  D. Her PTH level in August 2021 was mid-normal. Her calcium was at the low end of the reference range, because she was taking less calcium than she had intended. Her vitamin D level was good. She needs to increase her calcium intake to 600 mg, three times daily and take her vitamin D and calcium very consistently.  2.  Noncompliance: Since last visit her compliance with eating right and taking her medications has markedly improved.  17: Muscle cramps: Her cramps occur only infrequently now.  18. Hypothyroidism: Her TFTs in May 2019 and in December 2020 were normal.  Her current levothyroxine dosage is working well.  19. Sleeping difficulties: These problems have improved. I suspect that her increased caffeine intake was causing much of these problems.  20. Osteopenia/osteoporosis: Alexa [name]'s secondary hyperparathyroidism has normalized. She had a new BMD study this morning.Marland Kitchen   PLAN:  1. Diagnostic: I reviewed her lab results from August 2021. Repeat her annual surveillance labs prior to her next visit, plus her PTH, calcium, and vitamin D.    2. Therapeutic: Continue medications as currently prescribed, to include metformin doses of 1000 mg in the mornings and 1000 mg in the evenings. Continue Synthroid at bedtime. Take 600 mg of calcium at mealtimes. Take [employer], 50,000 IU per week to improve her vitamin D level. Try to fit in exercise daily.  See Ms. Showfety in follow up.    3. Patient education: We discussed all of the above at great length. She must continue to take her medications and supplements. She also needs to see me every two-three months. When she knows that she is coming to see "[facility] Reeks", her compliance improves.  4. Follow-up:  3 months   Level of Service: This visit lasted in excess of 55 minutes. More than 50% of the visit was devoted to counseling.   Tillman Sers, MD, CDE Adult and Pediatric Endocrinology 09/08/2019 9:23 AM

## 2019-09-18 ENCOUNTER — Other Ambulatory Visit (INDEPENDENT_AMBULATORY_CARE_PROVIDER_SITE_OTHER): Payer: Self-pay | Admitting: Family Medicine

## 2019-09-18 ENCOUNTER — Ambulatory Visit (INDEPENDENT_AMBULATORY_CARE_PROVIDER_SITE_OTHER): Payer: No Typology Code available for payment source | Admitting: Family Medicine

## 2019-09-18 ENCOUNTER — Other Ambulatory Visit: Payer: Self-pay

## 2019-09-18 ENCOUNTER — Encounter (INDEPENDENT_AMBULATORY_CARE_PROVIDER_SITE_OTHER): Payer: Self-pay | Admitting: Family Medicine

## 2019-09-18 VITALS — BP 119/78 | HR 85 | Temp 98.0°F | Ht 63.0 in | Wt 167.0 lb

## 2019-09-18 DIAGNOSIS — Z794 Long term (current) use of insulin: Secondary | ICD-10-CM

## 2019-09-18 DIAGNOSIS — E1121 Type 2 diabetes mellitus with diabetic nephropathy: Secondary | ICD-10-CM | POA: Diagnosis not present

## 2019-09-18 DIAGNOSIS — E669 Obesity, unspecified: Secondary | ICD-10-CM | POA: Diagnosis not present

## 2019-09-18 DIAGNOSIS — Z6830 Body mass index (BMI) 30.0-30.9, adult: Secondary | ICD-10-CM

## 2019-09-18 DIAGNOSIS — E559 Vitamin D deficiency, unspecified: Secondary | ICD-10-CM

## 2019-09-18 DIAGNOSIS — N1831 Chronic kidney disease, stage 3a: Secondary | ICD-10-CM | POA: Diagnosis not present

## 2019-09-18 MED ORDER — SAXENDA 18 MG/3ML ~~LOC~~ SOPN: 3.0000 mg | PEN_INJECTOR | Freq: Every day | SUBCUTANEOUS | 2 refills | Status: DC

## 2019-09-18 MED FILL — SAXENDA 18 MG/3 ML PEN: 18 | 30 days supply | Qty: 15 | Fill #0

## 2019-09-18 NOTE — Progress Notes (Signed)
Chief Complaint:   OBESITY Alexa [name] is here to discuss her progress with her obesity treatment plan along with follow-up of her obesity related diagnoses. Alexa [name] is on practicing portion control and making smarter food choices, such as increasing vegetables and decreasing simple carbohydrates with 85+ grams of protein daily and states she is following her eating plan approximately 50% of the time. Alexa [name] states she is doing 0 minutes 0 times per week.  Today's visit was #: 16 Starting weight: 169 lbs Starting date: 11/16/2017 Today's weight: 167 lbs Today's date: 09/18/2019 Total lbs lost to date: 2 Total lbs lost since last in-office visit: 0  Interim History: Alexa [name] has maintained her weight today. Her weight goal is 160 (28 BMI). She is under a great deal of stress due to her father's declining. 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 She often lacks time to eat a meal at work. She is trying her best to increase her protein intake although she is not tracking this and notes she feels unable to do so at this time. She admits to having some soda at work.  Subjective:   1. Type 2 diabetes mellitus with hyperglycemia, with long-term current use of insulin (HCC) Alexa [name]'s A1c is down to 7.2 from 7.7. DM is managed by Dr. Tobe Sos. She reports his goal for her is A1c of 6.5. She is concerned about her kidney function and is trying to drink water to prevent further decline.She has a history of non-compliance with medications. However, she is taking [name] daily. She occasionally misses metformin.   Lab Results  Component Value Date   HGBA1C 7.2 (A) 09/08/2019   HGBA1C 7.7 (A) 06/02/2019   HGBA1C 7.7 (A) 02/02/2019   Lab Results  Component Value Date   MICROALBUR 2.1 01/25/2019   LDLCALC 87 01/25/2019   CREATININE 1.25 (H) 01/25/2019   Lab Results  Component Value Date   INSULIN 8.1 11/16/2017   2. Vitamin D deficiency Alexa [name]'s last Vit D level was low  at 42. She is on weekly Vit D 50,000 IU.  Assessment/Plan:   1. Type 2 diabetes mellitus with hyperglycemia, with long-term current use of insulin ([place])  Nikola agreed to continue all her medications, and will follow up as directed.  2. Vitamin D deficiency Low Vitamin D level contributes to fatigue and are associated with obesity, breast, and colon cancer. Alexa [name] agreed to continue taking prescription Vitamin D 50,000 IU every week and will follow-up for routine testing of Vitamin D, at least 2-3 times per year to avoid over-replacement. She will also add 2000 IU of OTC D3 daily.  3. Class 1 obesity with serious comorbidity and body mass index (BMI) of 30.0 to 30.9 in adult, unspecified obesity type Alexa [name] is currently in the action stage of change. As such, her goal is to continue with weight loss efforts. She has agreed to practicing portion control and making smarter food choices, such as increasing vegetables and decreasing simple carbohydrates. She is to eat protein first.  We discussed various medication options to help Alexa [name] with her weight loss efforts and we both agreed to continue Saxenda and we will refill for 2 months. She is to drink 64 oz of water per day.  - Liraglutide -Weight Management (SAXENDA) 18 MG/3ML SOPN; Inject 0.5 mLs (3 mg total) into the skin daily.  Dispense: 15 mL; Refill: 2  Exercise goals: No exercise has been prescribed at this time.  Behavioral modification strategies:  increasing lean protein intake and decreasing liquid calories.  Alexa [name] has agreed to follow-up with our clinic in 8 weeks. She was informed of the importance of frequent follow-up visits to maximize her success with intensive lifestyle modifications for her multiple health conditions.   Objective:   Blood pressure 119/78, pulse 85, temperature 98 F (36.7 C), temperature source Oral, height 5\' 3"  (1.6 m), weight 167 lb (75.8 kg), last menstrual period 05/11/2010, SpO2 99 %. Body mass  index is 29.58 kg/m.  General: Cooperative, alert, well developed, in no acute distress. HEENT: Conjunctivae and lids unremarkable. Cardiovascular: Regular rhythm.  Lungs: Normal work of breathing. Neurologic: No focal deficits.   Lab Results  Component Value Date   CREATININE 1.25 (H) 01/25/2019   BUN 20 01/25/2019   NA 139 01/25/2019   K 4.7 01/25/2019   CL 103 01/25/2019   CO2 26 01/25/2019   Lab Results  Component Value Date   ALT 10 03/27/2019   AST 13 03/27/2019   ALKPHOS 85 05/27/2016   BILITOT 0.4 03/27/2019   Lab Results  Component Value Date   HGBA1C 7.2 (A) 09/08/2019   HGBA1C 7.7 (A) 06/02/2019   HGBA1C 7.7 (A) 02/02/2019   HGBA1C 8.2 (A) 09/08/2018   HGBA1C 7.7 (A) 03/24/2018   Lab Results  Component Value Date   INSULIN 8.1 11/16/2017   Lab Results  Component Value Date   TSH 1.87 01/25/2019   Lab Results  Component Value Date   CHOL 169 01/25/2019   HDL 65 01/25/2019   LDLCALC 87 01/25/2019   TRIG 81 01/25/2019   CHOLHDL 2.6 01/25/2019   Lab Results  Component Value Date   WBC 5.5 03/27/2019   HGB 12.1 03/27/2019   HCT 36.8 03/27/2019   MCV 84.8 03/27/2019   PLT 316 03/27/2019   Lab Results  Component Value Date   IRON 55 05/08/2013   TIBC 357 04/14/2012   Attestation Statements:   Reviewed by clinician on day of visit: allergies, medications, problem list, medical history, surgical history, family history, social history, and previous encounter notes.   Wilhemena Durie, am acting as Location manager for [employer], FNP-C.  I have reviewed the above documentation for accuracy and completeness, and I agree with the above. -  Georgianne Fick, FNP

## 2019-09-19 ENCOUNTER — Other Ambulatory Visit: Payer: Self-pay | Admitting: Internal Medicine

## 2019-09-19 ENCOUNTER — Encounter (INDEPENDENT_AMBULATORY_CARE_PROVIDER_SITE_OTHER): Payer: Self-pay | Admitting: *Deleted

## 2019-09-19 MED FILL — ESCITALOPRAM 20 MG TABLET: 20 | 90 days supply | Qty: 90 | Fill #1

## 2019-09-20 ENCOUNTER — Ambulatory Visit (HOSPITAL_COMMUNITY): Payer: No Typology Code available for payment source

## 2019-09-21 ENCOUNTER — Ambulatory Visit: Payer: No Typology Code available for payment source | Admitting: Interventional Cardiology

## 2019-09-21 NOTE — Progress Notes (Signed)
Cardiology Office Note   Date:  09/22/2019   ID:  Alexa ELIZARRARAZ, DOB January 21, 1961, MRN [id number]  PCP:  Elby Showers, MD    No chief complaint on file.  HTN  Wt Readings from Last 3 Encounters:  09/22/19 172 lb (78 kg)  09/18/19 167 lb (75.8 kg)  09/08/19 171 lb 6.4 oz (77.7 kg)       History of Present Illness: Alexa [name] is a 59 y.o. female  with a hx of medication non-compliance, non-obstructive CAD per cath 2003 and subsequent cath in 2007 with minor obstructive disease (20-30% narrowing of theLADostially and 30-40% mid LADstenosis), uncontrolledHTN,uncontrolled DM II, HLD, obesity, peripheral neuropathy, GERD, asthma, and hypothyroidism  ER visit in 2/19: "In the ED, her troponin levels have been negative x 2 (<0.03, <0.03). Her EKGunremarkable with BBB, no evidence of acute ischemia.A CXR was performed that showed no active cardiopulmonary disease. Her BP's were and have been markedly elevated since admission, 180/108>184/104>167/99."  In 2019, She tried a weight loss drug called Contrave.  Side effects include high blood pressure and dry mouth and she reported both.  Plan in 2019 was : " Continue carvedilol.  Change Micardis to 20 mg twice daily.  This may help with morning blood pressures.  Would also have her wean off of Contrave.  She will check her blood pressure at home and let us know if the readings are high.  If she continued to have high blood pressure even after weaning off of the weight loss supplement, would uptitrate her carvedilol."  Since the last visit, she has been following a healthy diet through Dr. Migdalia Dk office.  Her blood pressures have been well controlled.  She does not check frequently at home despite having a machine.  She has not been exercising as much as recommended as well.  She is trying to increase this.  She has had her Covid vaccines.  Denies : Chest pain. Dizziness. Leg edema. Nitroglycerin use. Orthopnea. Palpitations.  Paroxysmal nocturnal dyspnea. Shortness of breath. Syncope.      Past Medical History:  Diagnosis Date  . Anemia, iron deficiency   . Anhedonia   . Anxiety   . Asthma   . Asthma, chronic   . Back pain   . CAD (coronary artery disease)   . Chewing difficulty   . Chronic insomnia   . Combined hyperlipidemia   . Constipation   . Depression   . Diabetes mellitus   . Diabetes mellitus type II   . Diabetic autonomic neuropathy ([place])   . Difficulty swallowing pills   . DM type 2 with diabetic peripheral neuropathy ([place])   . Dry mouth   . Dyspepsia   . Dyspnea   . Elevated homocysteine   . Essential tremor   . Fatigue   . Fatty liver   . GERD (gastroesophageal reflux disease)   . GERD (gastroesophageal reflux disease)   . Glossitis   . Goiter   . Hemorrhoids   . HLD (hyperlipidemia)   . Hyperparathyroidism , secondary, non-renal ([place])   . Hypertension   . Hypocalcemia   . Insomnia   . Leg edema   . Leg pain   . Macular degeneration   . Nonproliferative diabetic retinopathy associated with type 2 diabetes mellitus ([place])    RESOLVED  . Obesity, Class III, BMI 40-49.9 (morbid obesity) ([place])   . Osteopenia   . Osteoporosis   . Pallor   . Status post gastric surgery   .  Tachycardia   . Thyroiditis, autoimmune   . Vaginal dryness, menopausal   . Vitamin D deficiency     Past Surgical History:  Procedure Laterality Date  . BREAST REDUCTION SURGERY Bilateral 12/14/2017   Procedure: BILATERAL MAMMARY REDUCTION  (BREAST);  Surgeon: Irene Limbo, MD;  Location: [facility];  Service: Plastics;  Laterality: Bilateral;  . CARDIAC CATHETERIZATION     x 2  . REDUCTION MAMMAPLASTY  11/2017  . ROUX-EN-Y PROCEDURE    . TONSILLECTOMY       Current Outpatient Medications  Medication Sig Dispense Refill  . albuterol (PROVENTIL) (2.5 MG/3ML) 0.083% nebulizer solution Take 3 mLs (2.5 mg total) by nebulization every 6 (six) hours as needed for wheezing or  shortness of breath. 150 mL 1  . albuterol (VENTOLIN HFA) 108 (90 Base) MCG/ACT inhaler Inhale 2 puffs into the lungs every 6 (six) hours as needed for wheezing or shortness of breath. 6.7 g 11  . ALPRAZolam (XANAX) 0.5 MG tablet TAKE 1/2 TO 1 TABLET BY MOUTH 2 TIMES A DAY AS NEEDED FOR ANXIETY 60 tablet 5  . Blood Glucose Monitoring Suppl (FREESTYLE LITE) DEVI Use to check glucose 3x daily 2 each 5  . calcium-vitamin D (OSCAL WITH D) 500-200 MG-UNIT TABS tablet Take by mouth.    . Cholecalciferol 50 MCG (2000 UT) CAPS Take 1 capsule by mouth daily.    Marland Kitchen conjugated estrogens (PREMARIN) vaginal cream Place 1 Applicatorful vaginally at bedtime. Use for 21 days then off for 7 days 42.5 g 11  . Cyanocobalamin (VITAMIN B 12) 100 MCG LOZG Take by mouth.    . cyclobenzaprine (FLEXERIL) 10 MG tablet Take 1 tablet (10 mg total) by mouth 3 (three) times daily as needed for muscle spasms. 90 tablet 0  . diphenhydrAMINE (SOMINEX) 25 MG tablet Take by mouth.    . Efinaconazole 10 % SOLN Apply 1 drop topically daily. 4 mL 11  . escitalopram (LEXAPRO) 20 MG tablet Take 1 tablet (20 mg total) by mouth daily. 90 tablet 1  . Ferrous Sulfate (SLOW FE PO) Take 1 tablet by mouth daily.     Marland Kitchen glucose blood (FREESTYLE LITE) test strip Use to check glucose 3x daily 300 each 1  . ibuprofen (ADVIL,MOTRIN) 200 MG tablet Take 600 mg by mouth daily as needed for headache or moderate pain.    . Insulin Pen Needle 32G X 4 MM MISC Use with Saxenda daily 100 each 0  . levothyroxine (SYNTHROID) 25 MCG tablet TAKE 1 TABLET BY MOUTH ONCE DAILY 90 tablet 1  . Liraglutide -Weight Management (SAXENDA) 18 MG/3ML SOPN Inject 0.5 mLs (3 mg total) into the skin daily. 15 mL 2  . metFORMIN (GLUCOPHAGE) 1000 MG tablet TAKE 1 TABLET BY MOUTH 2 TIMES DAILY WITH A MEAL. 180 tablet 0  . Multiple Vitamin (MULTIVITAMIN) tablet Take 1 tablet by mouth daily. Reported on 02/26/2015 30 tablet 0  . ondansetron (ZOFRAN) 4 MG tablet Take 1 tablet (4 mg  total) by mouth every 8 (eight) hours as needed for nausea or vomiting. 20 tablet 0  . pantoprazole (PROTONIX) 40 MG tablet Take 1 tablet (40 mg total) by mouth daily. 90 tablet 4  . telmisartan (MICARDIS) 40 MG tablet Take 0.5 tablets (20 mg total) by mouth 2 (two) times daily. 90 tablet 3  . terbinafine (LAMISIL) 250 MG tablet Take 1 tablet (250 mg total) by mouth daily. 90 tablet 0  . traMADol (ULTRAM) 50 MG tablet Take 1 tablet (50  mg total) by mouth daily as needed for moderate pain or severe pain. 30 tablet 1  . Vitamin D, Ergocalciferol, (DRISDOL) 50000 units CAPS capsule Take 50,000 Units by mouth every Sunday.    Marland Kitchen COREG 3.125 MG tablet Take 1 tablet (3.125 mg total) by mouth 2 (two) times daily with a meal. 60 tablet 11   No current facility-administered medications for this visit.    Allergies:   Theophyllines    Social History:  The patient  reports that she has never smoked. She has never used smokeless tobacco. She reports that she does not drink alcohol and does not use drugs.   Family History:  The patient's family history includes Colon cancer in an other family member; Depression in her mother; Diabetes in her father and maternal grandmother; Heart disease in her father; Hyperlipidemia in her father; Hypertension in her father; Liver disease in her maternal aunt and mother; Obesity in her brother and mother; Thyroid disease in her mother and sister.    ROS:  Please see the history of present illness.   Otherwise, review of systems are positive for less exercise than recommended.   All other systems are reviewed and negative.    PHYSICAL EXAM: VS:  BP 110/66   Pulse 74   Ht 5\' 3"  (1.6 m)   Wt 172 lb (78 kg)   LMP 05/11/2010   SpO2 98%   BMI 30.47 kg/m  , BMI Body mass index is 30.47 kg/m. GEN: Well nourished, well developed, in no acute distress  HEENT: normal  Neck: no JVD, carotid bruits, or masses Cardiac: RRR; no murmurs, rubs, or gallops,no edema    Respiratory:  clear to auscultation bilaterally, normal work of breathing GI: soft, nontender, nondistended, + BS MS: no deformity or atrophy  Skin: warm and dry, no rash Neuro:  Strength and sensation are intact Psych: euthymic mood, full affect   EKG:   The ekg ordered today demonstrates NSR, no ST changes   Recent Labs: 01/25/2019: BUN 20; Creat 1.25; Potassium 4.7; Sodium 139; TSH 1.87 03/27/2019: ALT 10; Hemoglobin 12.1; Platelets 316   Lipid Panel    Component Value Date/Time   CHOL 169 01/25/2019 0918   TRIG 81 01/25/2019 0918   HDL 65 01/25/2019 0918   CHOLHDL 2.6 01/25/2019 0918   VLDL 10 03/05/2017 1903   LDLCALC 87 01/25/2019 0918     Other studies Reviewed: Additional studies/ records that were reviewed today with results demonstrating: labs reviewed. Normal stress test in 2/19   ASSESSMENT AND PLAN:  1. HTN: The current medical regimen is effective;  continue present plan and medications.  Low salt diet.   2. DM2: REgular exercise and whole food plant based diet recommended.  Avoid processed foods.  A1C 7.2 in 8/21.  Going to healthy weight and wellness clinic with Dr. Leafy Ro.  3. Hypothyroid: Normal TSH in 1.8 in 12/2018.  4. CKD:  Told she has stage 3a CKD.  Stay hydrated. Avoid nephrotoxins.     Current medicines are reviewed at length with the patient today.  The patient concerns regarding her medicines were addressed.  The following changes have been made:  No change  Labs/ tests ordered today include:  No orders of the defined types were placed in this encounter.   Recommend 150 minutes/week of aerobic exercise Low fat, low carb, high fiber diet recommended  Disposition:   FU as needed as BP is controlled   Signed, Larae Grooms, MD  09/22/2019 9:08 AM  [facility] [address], [place], [place]  [address] Phone: [phone]; Fax: [phone]

## 2019-09-22 ENCOUNTER — Encounter: Payer: Self-pay | Admitting: Interventional Cardiology

## 2019-09-22 ENCOUNTER — Ambulatory Visit (HOSPITAL_COMMUNITY)
Admission: RE | Admit: 2019-09-22 | Discharge: 2019-09-22 | Disposition: A | Discharge status: Home / Self Care | Payer: No Typology Code available for payment source | Source: Ambulatory Visit | Attending: Internal Medicine | Admitting: Internal Medicine

## 2019-09-22 ENCOUNTER — Other Ambulatory Visit: Payer: Self-pay

## 2019-09-22 ENCOUNTER — Ambulatory Visit: Payer: No Typology Code available for payment source | Admitting: Interventional Cardiology

## 2019-09-22 VITALS — BP 110/66 | HR 74 | Ht 63.0 in | Wt 172.0 lb

## 2019-09-22 DIAGNOSIS — I10 Essential (primary) hypertension: Secondary | ICD-10-CM

## 2019-09-22 DIAGNOSIS — N1831 Chronic kidney disease, stage 3a: Secondary | ICD-10-CM | POA: Diagnosis not present

## 2019-09-22 DIAGNOSIS — E119 Type 2 diabetes mellitus without complications: Secondary | ICD-10-CM

## 2019-09-22 NOTE — Patient Instructions (Signed)
Medication Instructions:  Your physician recommends that you continue on your current medications as directed. Please refer to the Current Medication list given to you today.   *If you need a refill on your cardiac medications before your next appointment, please call your pharmacy*   Lab Work: None   If you have labs (blood work) drawn today and your tests are completely normal, you will receive your results only by: . MyChart Message (if you have MyChart) OR . A paper copy in the mail If you have any lab test that is abnormal or we need to change your treatment, we will call you to review the results.   Testing/Procedures: None  Follow-Up: AS NEEDED   Other Instructions None  

## 2019-09-25 ENCOUNTER — Ambulatory Visit (AMBULATORY_SURGERY_CENTER): Payer: Self-pay | Admitting: *Deleted

## 2019-09-25 ENCOUNTER — Other Ambulatory Visit: Payer: Self-pay

## 2019-09-25 VITALS — Ht 63.0 in | Wt 171.0 lb

## 2019-09-25 DIAGNOSIS — Z1211 Encounter for screening for malignant neoplasm of colon: Secondary | ICD-10-CM

## 2019-09-25 MED ORDER — SUPREP BOWEL PREP KIT 17.5-3.13-1.6 GM/177ML PO SOLN: 1.0000 | Freq: Once | ORAL | 0 refills | Status: AC

## 2019-09-25 MED FILL — SUPREP BOWEL PREP KIT: 17.5-3.13-1 | 1 days supply | Qty: 354 | Fill #0

## 2019-09-25 NOTE — Progress Notes (Signed)
No egg or soy allergy known to patient   issues with past sedation with any surgeries or procedures of PONV- severe nausea  no intubation problems in the past  No FH of Malignant Hyperthermia No diet pills per patient No home 02 use per patient  No blood thinners per patient  Pt states  issues with constipation- life long- has very large stools- 2 day prep per procedure notes for this procedure- also she has hard stools   No A fib or A flutter  EMMI video to pt or via [place] 19 guidelines implemented in PV today with Pt and RN    cov vax x 2    Due to the COVID-19 pandemic we are asking patients to follow these guidelines. Please only bring one care partner. Please be aware that your care partner may wait in the car in the parking lot or if they feel like they will be too hot to wait in the car, they may wait in the lobby on the 4th floor. All care partners are required to wear a mask the entire time (we do not have any that we can provide them), they need to practice social distancing, and we will do a Covid check for all patient's and care partners when you arrive. Also we will check their temperature and your temperature. If the care partner waits in their car they need to stay in the parking lot the entire time and we will call them on their cell phone when the patient is ready for discharge so they can bring the car to the front of the building. Also all patient's will need to wear a mask into building.

## 2019-09-26 ENCOUNTER — Encounter: Payer: Self-pay | Admitting: Gastroenterology

## 2019-09-28 ENCOUNTER — Telehealth: Payer: Self-pay | Admitting: Internal Medicine

## 2019-09-28 MED FILL — ALPRAZolam 0.5 MG TABS: 0.5 | 30 days supply | Qty: 60 | Fill #0

## 2019-09-28 NOTE — Telephone Encounter (Signed)
Alexa [name] [phone]  [name] dropped off FMLA forms to be completed for [employer].

## 2019-09-28 NOTE — Telephone Encounter (Signed)
After reviewing forms that was dropped off, released it is not correct forms, this form is for someone that has been out of work. I called patient, she is going to look on web site or call [employer] and try to get corrects FMLA paperwork and drop off on Tuesday, we scheduled appointment for 10/10/2019, forms need to be completed and faxed back by 10/11/2019.

## 2019-10-10 ENCOUNTER — Ambulatory Visit: Payer: No Typology Code available for payment source | Admitting: Internal Medicine

## 2019-10-17 ENCOUNTER — Other Ambulatory Visit: Payer: Self-pay

## 2019-10-17 ENCOUNTER — Encounter: Payer: Self-pay | Admitting: Gastroenterology

## 2019-10-17 ENCOUNTER — Ambulatory Visit (AMBULATORY_SURGERY_CENTER): Payer: No Typology Code available for payment source | Admitting: Gastroenterology

## 2019-10-17 VITALS — BP 112/75 | HR 72 | Temp 96.9°F | Resp 22 | Ht 63.0 in | Wt 171.0 lb

## 2019-10-17 DIAGNOSIS — D122 Benign neoplasm of ascending colon: Secondary | ICD-10-CM | POA: Diagnosis not present

## 2019-10-17 DIAGNOSIS — Z1211 Encounter for screening for malignant neoplasm of colon: Secondary | ICD-10-CM

## 2019-10-17 MED ORDER — SODIUM CHLORIDE 0.9 % IV SOLN: 500.0000 mL | Freq: Once | INTRAVENOUS | Status: DC

## 2019-10-17 NOTE — Progress Notes (Signed)
pt tolerated well. VSS. awake and to recovery. Report given to RN.  

## 2019-10-17 NOTE — Patient Instructions (Signed)
Handout given for polyps, await pathology results.  You need another colonoscopy in 1 year with an extended prep.  YOU HAD AN ENDOSCOPIC PROCEDURE TODAY AT [facility]:   Refer to the procedure report that was given to you for any specific questions about what was found during the examination.  If the procedure report does not answer your questions, please call your gastroenterologist to clarify.  If you requested that your care partner not be given the details of your procedure findings, then the procedure report has been included in a sealed envelope for you to review at your convenience later.  YOU SHOULD EXPECT: Some feelings of bloating in the abdomen. Passage of more gas than usual.  Walking can help get rid of the air that was put into your GI tract during the procedure and reduce the bloating. If you had a lower endoscopy (such as a colonoscopy or flexible sigmoidoscopy) you may notice spotting of blood in your stool or on the toilet paper. If you underwent a bowel prep for your procedure, you may not have a normal bowel movement for a few days.  Please Note:  You might notice some irritation and congestion in your nose or some drainage.  This is from the oxygen used during your procedure.  There is no need for concern and it should clear up in a day or so.  SYMPTOMS TO REPORT IMMEDIATELY:   Following lower endoscopy (colonoscopy or flexible sigmoidoscopy):  Excessive amounts of blood in the stool  Significant tenderness or worsening of abdominal pains  Swelling of the abdomen that is new, acute  Fever of 100F or higher  For urgent or emergent issues, a gastroenterologist can be reached at any hour by calling [phone]. Do not use MyChart messaging for urgent concerns.    DIET:  We do recommend a small meal at first, but then you may proceed to your regular diet.  Drink plenty of fluids but you should avoid alcoholic beverages for 24 hours.  ACTIVITY:  You  should plan to take it easy for the rest of today and you should NOT DRIVE or use heavy machinery until tomorrow (because of the sedation medicines used during the test).    FOLLOW UP: Our staff will call the number listed on your records 48-72 hours following your procedure to check on you and address any questions or concerns that you may have regarding the information given to you following your procedure. If we do not reach you, we will leave a message.  We will attempt to reach you two times.  During this call, we will ask if you have developed any symptoms of COVID 19. If you develop any symptoms (ie: fever, flu-like symptoms, shortness of breath, cough etc.) before then, please call [phone].  If you test positive for Covid 19 in the 2 weeks post procedure, please call and report this information to [name].    If any biopsies were taken you will be contacted by phone or by letter within the next 1-3 weeks.  Please call us at [phone] if you have not heard about the biopsies in 3 weeks.    SIGNATURES/CONFIDENTIALITY: You and/or your care partner have signed paperwork which will be entered into your electronic medical record.  These signatures attest to the fact that that the information above on your After Visit Summary has been reviewed and is understood.  Full responsibility of the confidentiality of this discharge information lies with you and/or your  care-partner.

## 2019-10-17 NOTE — Progress Notes (Signed)
Called to room to assist during endoscopic procedure.  Patient ID and intended procedure confirmed with present staff. Received instructions for my participation in the procedure from the performing physician.  

## 2019-10-17 NOTE — Op Note (Signed)
[facility] Patient Name: Alexa [name] Procedure Date: 10/17/2019 8:47 AM MRN: [id number] Endoscopist: Mallie Mussel L. Loletha Carrow , MD Age: 59 Referring MD:  Date of Birth: 1960/11/16 Gender: Female Account #: [id number] Procedure:                Colonoscopy Indications:              Screening for colorectal malignant neoplasm (last                            colonoscopy 09/2012 without polyps but fair prep -                            patient was thus given a 2 day prep for this exam) Medicines:                Monitored Anesthesia Care Procedure:                Pre-Anesthesia Assessment:                           - Prior to the procedure, a History and Physical                            was performed, and patient medications and                            allergies were reviewed. The patient's tolerance of                            previous anesthesia was also reviewed. The risks                            and benefits of the procedure and the sedation                            options and risks were discussed with the patient.                            All questions were answered, and informed consent                            was obtained. Prior Anticoagulants: The patient has                            taken no previous anticoagulant or antiplatelet                            agents. ASA Grade Assessment: III - A patient with                            severe systemic disease. After reviewing the risks                            and benefits, the patient was deemed in  satisfactory condition to undergo the procedure.                           After obtaining informed consent, the colonoscope                            was passed under direct vision. Throughout the                            procedure, the patient's blood pressure, pulse, and                            oxygen saturations were monitored continuously. The                             Colonoscope was introduced through the anus and                            advanced to the the cecum, identified by                            appendiceal orifice and ileocecal valve. The                            colonoscopy was somewhat difficult due to a                            redundant colon. The patient tolerated the                            procedure well. The quality of the bowel                            preparation was fair despite extensive irrigation).                            The ileocecal valve, appendiceal orifice, and                            rectum were photographed. The bowel preparation                            used was 2 day Suprep/Miralax. Scope In: 9:00:03 AM Scope Out: 9:21:10 AM Scope Withdrawal Time: 0 hours 13 minutes 57 seconds  Total Procedure Duration: 0 hours 21 minutes 7 seconds  Findings:                 The perianal and digital rectal examinations were                            normal.                           A 3 mm polyp was found in the hepatic flexure. The  polyp was sessile. The polyp was removed with a                            cold snare. Resection and retrieval were complete.                           The colon (entire examined portion) was redundant.                           The exam was otherwise without abnormality on                            direct and retroflexion views. Complications:            No immediate complications. Estimated Blood Loss:     Estimated blood loss was minimal. Impression:               - Preparation of the colon was fair.                           - One 3 mm polyp at the hepatic flexure, removed                            with a cold snare. Resected and retrieved.                           - Redundant colon.                           - The examination was otherwise normal on direct                            and retroflexion views. Recommendation:           - Patient has a  contact number available for                            emergencies. The signs and symptoms of potential                            delayed complications were discussed with the                            patient. Return to normal activities tomorrow.                            Written discharge instructions were provided to the                            patient.                           - Resume previous diet.                           - Continue present medications.                           -  Await pathology results.                           - Repeat colonoscopy in 1 year for surveillance                            (fair prep). (for that exam, 2 days of miralax                            followed by 4-liter PEG prep and early afternoon                            case, adult colonoscope)                           - Return to my office at appointment to be                            scheduled to address chronic constipation. Yanna Leaks L. Loletha Carrow, MD 10/17/2019 9:31:47 AM This report has been signed electronically.

## 2019-10-17 NOTE — Progress Notes (Signed)
Pt's states no medical or surgical changes since previsit or office visit. VS by Amparo Bristol.

## 2019-10-18 ENCOUNTER — Telehealth: Payer: Self-pay

## 2019-10-18 NOTE — Telephone Encounter (Signed)
Left message for patient to return call. Per procedure note from 10/17/19 pt is to be scheduled for a follow up for chronic constipation.

## 2019-10-19 ENCOUNTER — Telehealth: Payer: Self-pay

## 2019-10-19 NOTE — Telephone Encounter (Signed)
  Follow up Call-  Call back number 10/17/2019  Post procedure Call Back phone  # [phone]  Permission to leave phone message Yes  Some recent data might be hidden     Patient questions:  Do you have a fever, pain , or abdominal swelling? No. Pain Score  0 *  Have you tolerated food without any problems? Yes.    Have you been able to return to your normal activities? Yes.    Do you have any questions about your discharge instructions: Diet   No. Medications  No. Follow up visit  No.  Do you have questions or concerns about your Care? No.  Actions: * If pain score is 4 or above No action needed, pain <4. 1. Have you developed a fever since your procedure? no  2.   Have you had an respiratory symptoms (SOB or cough) since your procedure? no  3.   Have you tested positive for COVID 19 since your procedure no  4.   Have you had any family members/close contacts diagnosed with the COVID 19 since your procedure?  no   If yes to any of these questions please route to Joylene John, RN and Joella Prince, RN

## 2019-10-19 NOTE — Telephone Encounter (Signed)
Left detailed message letting patient know that per her procedure note from 10/17/19 that Dr. Loletha Carrow wanted [name] to get her scheduled for a follow up. Advised patient to give [name] a call when she was ready to schedule follow up.

## 2019-10-24 ENCOUNTER — Encounter: Payer: Self-pay | Admitting: Gastroenterology

## 2019-10-27 ENCOUNTER — Other Ambulatory Visit: Payer: Self-pay

## 2019-10-27 ENCOUNTER — Ambulatory Visit (INDEPENDENT_AMBULATORY_CARE_PROVIDER_SITE_OTHER): Payer: No Typology Code available for payment source | Admitting: *Deleted

## 2019-10-27 DIAGNOSIS — B351 Tinea unguium: Secondary | ICD-10-CM

## 2019-10-27 MED FILL — ALPRAZolam 0.5 MG TABS: 0.5 | 30 days supply | Qty: 60 | Fill #1

## 2019-10-27 NOTE — Progress Notes (Signed)
Patient presents today for the 2nd (2nd round) laser treatment. Diagnosed with mycotic nail infection by Dr. Jacqualyn Posey.   Toenail most affected the hallux bilateral. The right hallux nail is completely clear. The left hallux nail is better. She also has some tenderness and redness along the lateral border.  All other systems are negative.  Nails were filed thin. Laser therapy was administered to 1st toenails bilateral and patient tolerated the treatment well. All safety precautions were in place.   Patient has taken a 90 days of terbinafine (completed in May 2021). She is currently using Jublia topically.   Follow up in 8 weeks for laser # 3 to allow for more nail growth along the medial border of the hallux left.  ~Advised she should schedule an appointment with Dr. Jacqualyn Posey to have her ingrown toenail taken care of. She will do this on her way out today.

## 2019-11-24 ENCOUNTER — Other Ambulatory Visit: Payer: Self-pay

## 2019-11-24 ENCOUNTER — Other Ambulatory Visit: Payer: Self-pay | Admitting: Podiatry

## 2019-11-24 ENCOUNTER — Ambulatory Visit: Payer: No Typology Code available for payment source | Admitting: Podiatry

## 2019-11-24 ENCOUNTER — Telehealth: Payer: Self-pay | Admitting: Internal Medicine

## 2019-11-24 DIAGNOSIS — L600 Ingrowing nail: Secondary | ICD-10-CM

## 2019-11-24 DIAGNOSIS — B351 Tinea unguium: Secondary | ICD-10-CM | POA: Diagnosis not present

## 2019-11-24 DIAGNOSIS — M79676 Pain in unspecified toe(s): Secondary | ICD-10-CM

## 2019-11-24 MED ORDER — FLUCONAZOLE 150 MG PO TABS: 150.0000 mg | ORAL_TABLET | ORAL | 2 refills | Status: DC

## 2019-11-24 MED FILL — ALPRAZolam 0.5 MG TABS: 0.5 | 30 days supply | Qty: 60 | Fill #2

## 2019-11-24 MED FILL — FLUCONAZOLE 150 MG TABS: 150 | 28 days supply | Qty: 4 | Fill #0

## 2019-11-24 NOTE — Progress Notes (Signed)
  Subjective:  Patient ID: Alexa [name], female    DOB: 1960/04/22,  MRN: [id number]  Chief Complaint  Patient presents with  . Ingrown Toenail    Diabetic left great toenail- painful    59 y.o. female presents with the above complaint. History confirmed with patient. States the nail is very painful. Has been on laser therapy but this was temporarily halted due to the ingrown nail.  Objective:  Physical Exam: warm, good capillary refill, no trophic changes or ulcerative lesions, normal DP and PT pulses and normal sensory exam.  Painful ingrowing nail at lateral border of the left, hallux; without warmth, erythema or drainage  Assessment:   1. Onychomycosis   2. Ingrown nail   3. Pain around toenail      Plan:  Patient was evaluated and treated and all questions answered.  Ingrown Nail, left - Nail gently debrided in slant back fashion to patient relief. -Discussed should this recur consider permanent avulsion  Onychomycosis -Has failed several rounds of Lamisil, switch to fluconazole weekly for possible non-dermatophyte etiology -Continue laser therapy  Return if symptoms worsen or fail to improve.

## 2019-11-24 NOTE — Telephone Encounter (Signed)
Alexa [name] called to say she has an appointment today with Dr March Rummage instead of Dr Earleen Newport for insurance purposes. She has [facility] with [facility]

## 2019-11-30 MED FILL — SAXENDA 18 MG/3 ML PEN: 18 | 30 days supply | Qty: 15 | Fill #1

## 2019-12-11 ENCOUNTER — Ambulatory Visit (INDEPENDENT_AMBULATORY_CARE_PROVIDER_SITE_OTHER): Payer: No Typology Code available for payment source | Admitting: Family Medicine

## 2019-12-11 ENCOUNTER — Other Ambulatory Visit (INDEPENDENT_AMBULATORY_CARE_PROVIDER_SITE_OTHER): Payer: Self-pay | Admitting: "Endocrinology

## 2019-12-11 ENCOUNTER — Encounter (INDEPENDENT_AMBULATORY_CARE_PROVIDER_SITE_OTHER): Payer: Self-pay | Admitting: "Endocrinology

## 2019-12-11 ENCOUNTER — Other Ambulatory Visit: Payer: Self-pay | Admitting: Internal Medicine

## 2019-12-11 ENCOUNTER — Other Ambulatory Visit: Payer: Self-pay

## 2019-12-11 ENCOUNTER — Other Ambulatory Visit (INDEPENDENT_AMBULATORY_CARE_PROVIDER_SITE_OTHER): Payer: Self-pay

## 2019-12-11 ENCOUNTER — Ambulatory Visit (INDEPENDENT_AMBULATORY_CARE_PROVIDER_SITE_OTHER): Payer: No Typology Code available for payment source | Admitting: "Endocrinology

## 2019-12-11 VITALS — BP 122/68 | HR 72 | Wt 173.4 lb

## 2019-12-11 VITALS — BP 117/74 | HR 85 | Temp 97.5°F | Ht 63.0 in | Wt 172.0 lb

## 2019-12-11 DIAGNOSIS — E66812 Obesity, class 2: Secondary | ICD-10-CM

## 2019-12-11 DIAGNOSIS — R252 Cramp and spasm: Secondary | ICD-10-CM

## 2019-12-11 DIAGNOSIS — E063 Autoimmune thyroiditis: Secondary | ICD-10-CM

## 2019-12-11 DIAGNOSIS — E669 Obesity, unspecified: Secondary | ICD-10-CM

## 2019-12-11 DIAGNOSIS — O139 Gestational [pregnancy-induced] hypertension without significant proteinuria, unspecified trimester: Secondary | ICD-10-CM

## 2019-12-11 DIAGNOSIS — E1165 Type 2 diabetes mellitus with hyperglycemia: Secondary | ICD-10-CM

## 2019-12-11 DIAGNOSIS — E559 Vitamin D deficiency, unspecified: Secondary | ICD-10-CM

## 2019-12-11 DIAGNOSIS — M818 Other osteoporosis without current pathological fracture: Secondary | ICD-10-CM

## 2019-12-11 DIAGNOSIS — E1169 Type 2 diabetes mellitus with other specified complication: Secondary | ICD-10-CM

## 2019-12-11 DIAGNOSIS — E049 Nontoxic goiter, unspecified: Secondary | ICD-10-CM | POA: Diagnosis not present

## 2019-12-11 DIAGNOSIS — E11649 Type 2 diabetes mellitus with hypoglycemia without coma: Secondary | ICD-10-CM

## 2019-12-11 DIAGNOSIS — Z1231 Encounter for screening mammogram for malignant neoplasm of breast: Secondary | ICD-10-CM

## 2019-12-11 DIAGNOSIS — E119 Type 2 diabetes mellitus without complications: Secondary | ICD-10-CM | POA: Insufficient documentation

## 2019-12-11 DIAGNOSIS — E1140 Type 2 diabetes mellitus with diabetic neuropathy, unspecified: Secondary | ICD-10-CM

## 2019-12-11 DIAGNOSIS — IMO0002 Reserved for concepts with insufficient information to code with codable children: Secondary | ICD-10-CM

## 2019-12-11 DIAGNOSIS — K219 Gastro-esophageal reflux disease without esophagitis: Secondary | ICD-10-CM

## 2019-12-11 DIAGNOSIS — E1143 Type 2 diabetes mellitus with diabetic autonomic (poly)neuropathy: Secondary | ICD-10-CM

## 2019-12-11 DIAGNOSIS — I10 Essential (primary) hypertension: Secondary | ICD-10-CM

## 2019-12-11 LAB — POCT GLYCOSYLATED HEMOGLOBIN (HGB A1C): Hemoglobin A1C: 8.5 % — AB (ref 4.0–5.6)

## 2019-12-11 LAB — POCT GLUCOSE (DEVICE FOR HOME USE): Glucose Fasting, POC: 116 mg/dL — AB (ref 70–99)

## 2019-12-11 MED ORDER — TERBINAFINE HCL 250 MG PO TABS: 250.0000 mg | ORAL_TABLET | Freq: Every day | ORAL | 0 refills | Status: DC

## 2019-12-11 MED ORDER — LEVOTHYROXINE SODIUM 25 MCG PO TABS: ORAL_TABLET | ORAL | 1 refills | Status: DC

## 2019-12-11 MED ORDER — METFORMIN HCL 1000 MG PO TABS: ORAL_TABLET | ORAL | 0 refills | Status: DC

## 2019-12-11 MED ORDER — PANTOPRAZOLE SODIUM 40 MG PO TBEC: 40.0000 mg | DELAYED_RELEASE_TABLET | Freq: Every day | ORAL | 4 refills | Status: DC

## 2019-12-11 MED FILL — CARVEDILOL 3.125 MG TABLET: 3.125 | 60 days supply | Qty: 120 | Fill #0

## 2019-12-11 MED FILL — LEVOTHYROXINE SODIUM 25 MCG: 25 | 90 days supply | Qty: 90 | Fill #0

## 2019-12-11 MED FILL — TERBINAFINE HCL 250 MG TAB: 250 | 90 days supply | Qty: 90 | Fill #0

## 2019-12-11 MED FILL — TELMISARTAN 40 MG TABS: 40 | 90 days supply | Qty: 90 | Fill #0

## 2019-12-11 MED FILL — PANTOPRAZOLE SOD DR 40 MG T: 40 | 90 days supply | Qty: 90 | Fill #0

## 2019-12-11 MED FILL — METFORMIN HCL 1000 MG TABS: 1000 | 90 days supply | Qty: 180 | Fill #0

## 2019-12-11 NOTE — Telephone Encounter (Signed)
Needs CPE for March before can refill

## 2019-12-11 NOTE — Patient Instructions (Signed)
Follow up in 3 months

## 2019-12-11 NOTE — Progress Notes (Signed)
Subjective:  Patient Name: Alexa [name] Date of Birth: 05-23-1960  MRN: [id number]  Alexa [name]  presents to the office today for follow-up of her type 2 diabetes mellitus, obesity, combined hyperlipidemia, GERD, hypertension, ASHD, dyspepsia, pedal edema, non-proliferative diabetic retinopathy, goiter, depression, autonomic neuropathy, tachycardia, peripheral neuropathy, acquired hypothyroidism, vitamin D deficiency, hypocalcemia, secondary hyperparathyroidism, glossitis, pallor, fatigue, iron deficiency anemia, anhedonia, disinclination to take medicines, and status post Roux-en-Y gastric bypass.   HISTORY OF PRESENT ILLNESS:   Alexa [name] is a 59 y.o. Caucasian woman. Alexa [name] was unaccompanied.  1. The patient first presented to me on 08/20/04 in referral from her primary care internist, Dr. Emeline General, for evaluation and management of her type 2 diabetes, obesity, and multiple medical issues. She was 59 years old.   Alexa [name] had a long history of obesity. She was heavy as a child. She underwent menarche at age 59. At age 71 she was 180 pounds. In 1996 she weighed 218 pounds. In 1999 she was diagnosed with type 2 diabetes mellitus. Her weight at that point was 250 pounds. She was treated with Glucophage, and Actos. Actos made her gain even more weight. She had also been treated with glipizide in the past. More recently she had been treated with Lantus and Glucophage plus regular insulin as needed, especially for when she took steroids for asthma attacks. Maximum weight had been 296 pounds one month prior to that first visit with me. Her tendency to gain weight and her difficulty in losing weight were aggravated by long-standing, intermittent depression and by severe, recurrent asthma requiring the use of steroid medications.  B. Past medical history was also positive for a 60% blockage in one of her coronary arteries. She had significant issues with GERD and dyspepsia. She also had combined  hyperlipidemia. She had a previous cardiac catheterization and previous tonsillectomy. She was allergic to theophylline. Her pertinent review of systems was positive for some numbness and tingling in her feet. Family history was positive for type 2 diabetes in her father and her maternal grandmother. Her brother weighed 250 pounds at a height of 76 inches. Both her mother and her sister were hypothyroid.  C. On physical examination, her weight was 279.9 pounds. Her BMI was 49.6. Her blood pressure was 142/86. Her heart rate was 96. Her hemoglobin A1c was 9.8%. She was alert and oriented x3. Her affect was normal. Her insight was fair. She was obviously quite obese. She had a 25 gm thyroid gland. She had 1+ tremor of her hands. She had 1+ DP pulses and 2+ tinea pedis. Sensation to touch was intact in her feet. Laboratory data included a normal CMP. Her cholesterol was 175, triglycerides 76, HDL 53, and LDL 107. Her TSH was 2.98. Since she obviously did have type 2 diabetes mellitus associated with morbid obesity, and since weight loss was a major factor for her, I asked her to resume her metformin twice daily. I also started her on Byetta, initially 5 mcg twice daily and later 10 mcg twice daily. I discontinued her Lantus insulin.  2. During the last 15 years,we have had some successes, some failures, and some new problem areas.  A. T2DM: In October 2007 the combination of Byetta and metformin was causing more gastrointestinal problems. She opted to stop the metformin and continue the Byetta because it was helping her with weight control and blood sugar control. By 08/11/06 her weight had decreased to 267.6 pounds. Hemoglobin A1c was 8.6%. At that point  she decided to have bariatric surgery. She had a Roux-en-Y gastric bypass on 12/20/2006. She subsequently lost weight down to 173.4 pounds on 05/23/08, but then subsequently regained weight to the 190s. Her  hemoglobin A1c values varied in parallel with her weights.  On 05/23/08, at the point of her lowest weight, her hemoglobin A1c dropped to a nadir of 6.3%.  Since then her hemoglobin A1c values have varied between 6.6 and 13.4%. Although we were initially able to stop all of her diabetes medicines after bariatric surgery, when she began to regain weight we restarted metformin, 500 mg twice daily. Although she took her metformin twice daily for a long time, she had discontinued it many years ago. She hated to take medicines and could not make herself be compliant in taking medications or exercising. When her HbA1c increased to 9.4% in September 2015, I stopped her Byetta injections and started her on liraglutide (Victoza) daily injections on 11/16/13. After several more years of noncompliance, she has been doing much better in the past 2 years.   B. Vitamin D deficiency, hypocalcemia, and secondary hyperparathyroidism: Just before her gastric bypass, I obtained baseline bone mineral metabolism studies. Her 25-hydroxy vitamin D was very low at 7 (normal greater than or equal to 30). Her calcium was 9.5 (normal 8.6-10.6). Her parathyroid hormone was 38.4 (normal 14-72). Subsequent to her surgery, the patient was supposed to be taking multivitamins with calcium and vitamin D, but did not always do so. On 03/29/2007 her 25-hydroxy vitamin D was 29, but her calcium decreased to 8.7. Her PTH was slightly elevated at 73.5. Her 1,25-hydroxy vitamin D was 46. Her iron was 56. I asked her to make sure she took her multivitamins and calcium daily. Unfortunately, she has often been non-compliant with her multivitamins and calcium. Her 25-hydroxy vitamin D values have varied between 15-35. Her calcium values have varied between 8.1-9.4. Her PTH values have remained elevated between 79-167. Alexa [name]'s iron levels have also been low, between 32-34. She was supposed to be taking iron every day, but frequently did not do so. In the past year, however, she has been doing better at taking her  vitamin D, calcium, and iron. In May 2019 her vitamin D was at the lower end of the reference range, her calcium was about the 40% of the reference range, but her PTH was still significantly elevated. In December 2020 her PTH and calcium were mid-normal.   C. Alexa [name] has had psychological issues for many years that have adversely affected her eating, her unwillingness to exercise, her noncompliance with taking medications, her weight, and her T2DM care. Fortunately, her recent outpatient psychological therapy with Ms. Rea College, RN, MS and her taking Lexapro regularly have resulted in Alexa [name] having a much more positive attitude about life in general and a generally more positive attitude about taking her medications. Fortunately, Alexa [name] has been dealing successfully with her issues and her compliance has markedly improved.   D. Obesity: Although Alexa [name] regained a great deal of weight in the years following her bariatric surgery, in the past year she has been taking Contrave and has been able to lose all of the weight that she had gained after her bariatric surgery.   3. Alexa [name]'s last [facility] visit was on 09/08/19. At that visit I asked her to continue her morning dose of metformin of 1000 mg and continue the evening dose of 1000 mg. I continued her 25 mcg Synthroid dose at bedtime. I asked her to take one Biotech,  50,000 IU capsule of vitamin D weekly and calcium at each meal.   A. She has been healthy. She has not been taking calcium for the past two weeks and has not been taking all of her medications consistently. She has also been drinking more cokes. Her father has had multiple kidney stones, sepsis, a stoke, and a probable lung cancer. She is spending more time with him and her mother.   B. Dr. Renold Genta referred her to nephrology, Dr. Joylene Grapes. Her serum creatinine decreased to 0.92. She was diagnosed with stage 3A renal disease. Her renal US in August 2021 was "unremarkable".   C. Her husband has heart  failure. He is doing much better with his bipap machine. He takes his medicine.   D. She continues to have some shakiness of her hands that has been diagnosed as familial tremor.   E. She has been sleeping better with Xanax at bedtime. She has been drinking regular Coke more often. She does drink coffee on some mornings and tea at times.   F. She has gained two pounds in weight since her last visit. She is still taking 3 mg Saxenda (liraglutide) daily. She is followed at [facility].    Alexa [name] remains in counseling with Alexa [name] every month, most recently this morning.   H. She has not had much of a problem with allergies or asthma this year.    I. She still has some hot flashes, but not very often.   J. She has not been bothered by pains in her anterior shins, but does have occasional calf cramps.    K. 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   L. She is taking Lexapro, 20 mg/day; Synthroid, 25 mcg/day; Micardis, 20 mg, twice daily; Saxenda, 3 mg/day; Protonix, 40 mg/day; metformin 1000 mg on the morning and 1000 mg in the evening. She inadvertently has been taking only one 200 mg Citracal tablet thrice daily, instead of the 800 mg twice daily that she was taking previously. She is also taking Coreg, 3.125 mg, twice daily. She is also taking B12 daily, Biotech once weekly. She is not taking oral fungal treatment for her tinea pedis.   M. She is trying to achieve more balance.    4. Review of Systems: There are no other significant issues  Constitutional: "I feel fairly rested today. I have made peace with a lot of stuff. It is what it is." Marital relations are going better.   Eyes: Her eyes feel better. Her last eye exam occurred in the Fall of 2020. No diabetic eye disease was noted. She has some macular edema in her left eye, but this finding had not changed over time. She has a follow up visit early in 2022.   Neck: She  occasionally has a little chest discomfort. The patient has no complaints of anterior neck swelling, soreness, tenderness, pressure, or discomfort.  Heart: She had chest pain twice in the past week. She has a follow up appointment on 09/22/19 with her cardiologist. Heart rate increases with exercise or other physical activity. She has no complaints of palpitations or irregular heat beats. She saw Dr. Irish Lack in August 2021.  Gastrointestinal: She had a colonoscopy and had a polyp removed. She has nausea occasionally, but "not too bad'. She rarely  has the sensation of stomach pains. She still occasionally has post-prandial bloating if she eats too much. She always has constipation. She  occasionally has head hunger and belly hunger. She is not having acid reflux, upset stomach, stomach aches or pains, swallowing difficulties, diarrhea.  Hands: She still has tremors.  Legs: As above. She still occasionally has leg cramps after working. There are no other complaints of numbness, tingling, or burning. No edema is noted. Feet: There are no complaints of numbness, tingling, burning, or pain. No edema is noted. Tinea improved with her new laser therapy.  She had an ingrown toenail of her left great toe cut out two weeks ago.  Neuro: No new sensory or muscular problems Psych: She is doing pretty well.     GU: She has not been urinating a lot. She has not had much nocturia.   GYN: LMP was on 05/11/2010. She had a pap smear in 2020.  Hypoglycemia: None   5. BG printout: She is not checking BGs.  PAST MEDICAL, FAMILY, AND SOCIAL HISTORY:  Past Medical History:  Diagnosis Date  . Anemia, iron deficiency   . Anhedonia   . Anxiety   . Asthma   . Asthma, chronic   . Back pain   . CAD (coronary artery disease)   . Chewing difficulty   . Chronic insomnia   . Chronic kidney disease    CKD stage 3 A   . Combined hyperlipidemia   . Constipation   . Depression   . Diabetes mellitus   . Diabetes mellitus  type II   . Diabetic autonomic neuropathy ([place])   . Difficulty swallowing pills   . DM type 2 with diabetic peripheral neuropathy ([place])   . Dry mouth   . Dyspepsia   . Dyspnea   . Elevated homocysteine   . Essential tremor   . Fatigue   . Fatty liver   . GERD (gastroesophageal reflux disease)   . GERD (gastroesophageal reflux disease)   . Glossitis   . Goiter   . Hemorrhoids   . HLD (hyperlipidemia)   . Hyperparathyroidism , secondary, non-renal ([place])   . Hypertension   . Hypocalcemia   . Insomnia   . Leg edema   . Leg pain   . Macular degeneration   . Nonproliferative diabetic retinopathy associated with type 2 diabetes mellitus ([place])    RESOLVED  . Obesity, Class III, BMI 40-49.9 (morbid obesity) ([place])   . Osteopenia   . Osteoporosis   . Pallor   . PONV (postoperative nausea and vomiting)   . Status post gastric surgery   . Tachycardia   . Thyroiditis, autoimmune   . Vaginal dryness, menopausal   . Vitamin D deficiency     Family History  Problem Relation Age of Onset  . Thyroid disease Mother        Hypothyroid  . Depression Mother   . Liver disease Mother   . Obesity Mother   . Diabetes Father        T2 DM  . Hypertension Father   . Hyperlipidemia Father   . Heart disease Father   . Obesity Brother        250 pounds at a height of 76 inches.  . Thyroid disease Sister        Hypothyroid  . Diabetes Maternal Grandmother   . Colon cancer Other        great grandfather  . Liver disease Maternal Aunt        NASH  . Esophageal cancer Neg Hx   . Pancreatic cancer Neg Hx   . Kidney disease Neg  Hx   . Colon polyps Neg Hx   . Rectal cancer Neg Hx   . Stomach cancer Neg Hx      Current Outpatient Medications:  .  albuterol (PROVENTIL) (2.5 MG/3ML) 0.083% nebulizer solution, Take 3 mLs (2.5 mg total) by nebulization every 6 (six) hours as needed for wheezing or shortness of breath., Disp: 150 mL, Rfl: 1 .  albuterol (VENTOLIN HFA) 108 (90 Base) MCG/ACT  inhaler, Inhale 2 puffs into the lungs every 6 (six) hours as needed for wheezing or shortness of breath., Disp: 6.7 g, Rfl: 11 .  ALPRAZolam (XANAX) 0.5 MG tablet, TAKE 1/2 TO 1 TABLET BY MOUTH 2 TIMES A DAY AS NEEDED FOR ANXIETY, Disp: 60 tablet, Rfl: 5 .  Blood Glucose Monitoring Suppl (FREESTYLE LITE) DEVI, Use to check glucose 3x daily, Disp: 2 each, Rfl: 5 .  calcium-vitamin D (OSCAL WITH D) 500-200 MG-UNIT TABS tablet, Take by mouth., Disp: , Rfl:  .  Cholecalciferol 50 MCG (2000 UT) CAPS, Take 1 capsule by mouth daily., Disp: , Rfl:  .  conjugated estrogens (PREMARIN) vaginal cream, Place 1 Applicatorful vaginally at bedtime. Use for 21 days then off for 7 days, Disp: 42.5 g, Rfl: 11 .  COREG 3.125 MG tablet, Take 1 tablet (3.125 mg total) by mouth 2 (two) times daily with a meal., Disp: 60 tablet, Rfl: 11 .  Cyanocobalamin (VITAMIN B 12) 100 MCG LOZG, Take by mouth., Disp: , Rfl:  .  cyclobenzaprine (FLEXERIL) 10 MG tablet, Take 1 tablet (10 mg total) by mouth 3 (three) times daily as needed for muscle spasms., Disp: 90 tablet, Rfl: 0 .  diphenhydrAMINE (SOMINEX) 25 MG tablet, Take by mouth. (Patient not taking: Reported on 10/17/2019), Disp: , Rfl:  .  Efinaconazole 10 % SOLN, Apply 1 drop topically daily., Disp: 4 mL, Rfl: 11 .  escitalopram (LEXAPRO) 20 MG tablet, Take 1 tablet (20 mg total) by mouth daily., Disp: 90 tablet, Rfl: 1 .  Ferrous Sulfate (SLOW FE PO), Take 1 tablet by mouth daily. , Disp: , Rfl:  .  fluconazole (DIFLUCAN) 150 MG tablet, Take 1 tablet (150 mg total) by mouth once a week., Disp: 4 tablet, Rfl: 2 .  glucose blood (FREESTYLE LITE) test strip, Use to check glucose 3x daily, Disp: 300 each, Rfl: 1 .  ibuprofen (ADVIL,MOTRIN) 200 MG tablet, Take 600 mg by mouth daily as needed for headache or moderate pain., Disp: , Rfl:  .  Insulin Pen Needle 32G X 4 MM MISC, Use with Saxenda daily, Disp: 100 each, Rfl: 0 .  levothyroxine (SYNTHROID) 25 MCG tablet, TAKE 1 TABLET BY  MOUTH ONCE DAILY, Disp: 90 tablet, Rfl: 1 .  Liraglutide -Weight Management (SAXENDA) 18 MG/3ML SOPN, Inject 0.5 mLs (3 mg total) into the skin daily., Disp: 15 mL, Rfl: 2 .  metFORMIN (GLUCOPHAGE) 1000 MG tablet, TAKE 1 TABLET BY MOUTH 2 TIMES DAILY WITH A MEAL., Disp: 180 tablet, Rfl: 0 .  Multiple Vitamin (MULTIVITAMIN) tablet, Take 1 tablet by mouth daily. Reported on 02/26/2015, Disp: 30 tablet, Rfl: 0 .  ondansetron (ZOFRAN) 4 MG tablet, Take 1 tablet (4 mg total) by mouth every 8 (eight) hours as needed for nausea or vomiting. (Patient not taking: Reported on 10/17/2019), Disp: 20 tablet, Rfl: 0 .  pantoprazole (PROTONIX) 40 MG tablet, Take 1 tablet (40 mg total) by mouth daily., Disp: 90 tablet, Rfl: 4 .  telmisartan (MICARDIS) 40 MG tablet, Take 0.5 tablets (20 mg total) by mouth 2 (  two) times daily., Disp: 90 tablet, Rfl: 3 .  terbinafine (LAMISIL) 250 MG tablet, Take 1 tablet (250 mg total) by mouth daily. (Patient not taking: Reported on 10/17/2019), Disp: 90 tablet, Rfl: 0 .  traMADol (ULTRAM) 50 MG tablet, Take 1 tablet (50 mg total) by mouth daily as needed for moderate pain or severe pain. (Patient not taking: Reported on 10/17/2019), Disp: 30 tablet, Rfl: 1 .  Vitamin D, Ergocalciferol, (DRISDOL) 50000 units CAPS capsule, Take 50,000 Units by mouth every Sunday., Disp: , Rfl:   Allergies as of 12/11/2019 - Review Complete 12/11/2019  Allergen Reaction Noted  . Theophyllines Palpitations 05/13/2010    1. 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  2. Activities: She has not been walking for exercise.     3. Smoking, alcohol, or drugs: None 4. Primary Care Provider: Dr. Tommie Ard Baxley 5. Therapist: Ms. Rea College, MS 6. Bariatrician: Dr. Redgie Grayer, MD  REVIEW OF SYSTEM: There are no other significant problems involving Alexa [name] other body systems.    Objective:  Vital Signs: BP 122/68   Pulse 72   Wt 173 lb 6.4 oz (78.7 kg)   LMP 05/11/2010   BMI 30.72 kg/m    Wt Readings from Last 3 Encounters:  12/11/19 173 lb 6.4 oz (78.7 kg)  10/17/19 171 lb (77.6 kg)  09/25/19 171 lb (77.6 kg)    Ht Readings from Last 3 Encounters:  10/17/19 5\' 3"  (1.6 m)  09/25/19 5\' 3"  (1.6 m)  09/22/19 5\' 3"  (1.6 m)   Body mass index is 30.72 kg/m. Facility age limit for growth percentiles is 20 years.  Body surface area is 1.87 meters squared.   PHYSICAL EXAM:  Constitutional:  Alexa [name] looks good today. She is wearing address and has make up on today. She is alert and bright. Her affect and insight are very good. She has gained 2 pounds since her last visit. Her weight is slightly above the post-gastric bypass nadir of 173.4 pounds that it was in 2010. She is alert and bright. Her affect and insight are normal.  Eyes: There is no arcus or proptosis.  Mouth: The oropharynx appears normal. The tongue appears normal. There is normal oral moisture. There is no obvious gingivitis. Neck: There are no bruits present. The thyroid gland appears normal in size. The thyroid gland is again within normal limits at 18-20 grams in size. The consistency of the thyroid gland is normal. There is no thyroid tenderness to palpation. Lungs: The lungs are clear. Air movement is good. Heart: The heart rhythm and rate appear normal. Heart sounds S1 and S2 are normal. I do not appreciate any pathologic heart murmurs. Abdomen: The abdomen is enlarged. Bowel sounds are normal. The abdomen is soft and non-tender. There is no obviously palpable hepatomegaly, splenomegaly, or other masses.  Arms: Muscle mass appears appropriate for age.  Hands: She has a trace tremor. Phalangeal and metacarpophalangeal joints appear normal. Palms are normal. Legs: Muscle mass appears appropriate for age. There is no edema. Her shins are not tender to touch today.  Feet: There are no significant  deformities. Dorsalis pedis pulses are normal 1+ on the right and 1+ on the left. She has much less visible tinea in her great toenails. Her left great toenail is has been cut.  Neurologic: Muscle strength is normal for age and gender  in both the upper and the lower extremities. Muscle tone appears normal. Sensation to touch is normal in the legs and feet.   LAB DATA:  Labs 12/11/19: HbA1c 8.5%  Labs 09/08/19: HbA1c 7.2%, CBG 98  Labs 09/04/19: PTH 40, calcium 8.9, 25-OH vitamin D 42  Labs 06/02/19: HbA1c 7.7%, CBG 138  Labs 02/02/19: HbA1c 7.7%, CBG 101  Labs 01/25/19: TSH 1.87, free T4 1.3, free T3 2.6; C-peptide 1.27 (ref 0.80-3.85); CMP normal except creatinine 1.25; cholesterol 169, triglycerides 81, HDL 65, LDL 87; PTH 42 (ref 14-64), calcium 9.4 (ref 8.6-10.4); urinary microalbumin/creatinine ratio 9 (ref <30)   Labs 09/08/18: HbA1c 8.2%, CBG 123  Labs 03/24/18: HbA1c 7.7%, CBG 103  Labs 01/03/18: CBG 112 fasting  Lbs 12/02/17: sodium 134, glucose 322, creatinine 1.17  labs 11/16/17: Urine microalbumin/creatinine ratio 4.8; B12 282 (ref [phone]), folate 12.7 (ref >3.0)  Labs 11/01/17: HbA1c 6.9%, CBG 189 post-prandially; PTH 108, calcium 8.6, 25-OH vitamin D 64   Labs 09/13/17: Hepatic function panel normal; CBC normal  Labs 08/09/17: Hepatic function panel normal; CBC normal, except Hct 34.7 (35-45)  Labs 06/14/17: CBG 166  Labs 06/08/17: TSH 1.80, free T4 1.3, free T3 2.3; CMP normal except for glucose 115 and creatinine 1.27; PTH 120, calcium 9.7, 25-OH vitamin D 34;cholesterol 188, triglycerides 110, HDL 67, LDL 100; urine microalbumin/creatinine ratio   Labs 04/12/17: HbA1c 10.0%, CBG 174  Labs 03/04/17: Sodium 131, potassium 4.0, chloride 99, CO2 20, glucose 515, calcium 8.9  Labs 01/11/17: CBG 198  Labs 11/24/16: CBG 468; urine glucose 2000+, negative ketones  Labs 11/17/16: HbA1c 13.4%; TSH 1.54; cholesterol 173, triglycerides 91, HDL 71, LDL 83; 25-OH vitamin D 21;  CMP normal except for glucose 314; CBC normal except MCHC 26.5 (ref 27-33)   06/04/16: HBA1c 8.1%, CBG 115  05/27/16: TSH 2.62, free T4 1.1, free T3 2.3; CMP normal except for glucose 161, and creatinine 1.09; cholesterol 187, triglycerides 77, HDL 74, LDL 98; urinary microalbumin/creatinine ratio 6; PTH 112, calcium 9.2, 25-OH vitamin D 33  03/17/16: CBG 145  Labs 12/31/15: HbA1c 13.4%, CBG 390; CMP normal except for glucose 344 and creatinine 1.14  Labs 10/08/15: HbA1c 9.6%  Labs 07/02/15: HbA1c 8.9%; PTH 107, calcium 9.2, 25-OH vitamin D 33; CBC normal; CMP normal except for creatinine 1.07; cholesterol 174, triglycerides 91, HDL 74, LDL 82; TSH 2.43, free T4 1.2, free T3 2.3  Labs 02/19/15: PTH 99, calcium 8.7, 25-OH vitamin D 20  Labs 10/08/14: HbA1c 8.0%   Labs 08/06/14: HbA1c 7.4%  Labs 08/01/14: TSH 2.014, free T4 0.87, free T3 2.4; PTH 79 (normal 14-64), calcium 9.1; 25-OH vitamin D 35 CMP normal  Labs 05/11/14: HbA1c was 7.7%, without any low BGs; TSH 2.416, free T4 1.13, free T3 2.3  Labs 02/22/14: HbA1c 11.6%  Labs 02/13/14: Calcium 9.0, PTH 131, 25-OH vitamin D 15; CMP normal except for glucose of 165; cholesterol 177, triglycerides 108, HDL 90, LDL 65; urinary microalbumin/creatinine ratio 15.6; TSH 3.639, free T4 1.30, free T3 2.5  Labs 10/10/13: HbA1c is 9.4%, compared with 8.1% at last visit and with 9.3% in December;   Labs 05/08/13: HbA1c 8.1%; PTH 90.2, calcium 9.5, 25-hydroxy vitamin D 46; CMP normal except for glucose of 215; WBC 8.0, RBC 5.20, Hgb 12.5, Hct 37.8%, MCV 72.7 (normal 78-100), iron 55  Labs 02/23/13: CMP normal, except glucose 194 and alkaline phosphatase 126; microalbumin/creatinine ratio was 11.5; TSH 2.086, free T4 1.29, free T3 3.2, TPO antibody < 10; 25-hydroxy vitamin D  18; C-peptide 1.79; cholesterol 156, triglycerides 64, HDL 61, LDL 82  Labs 01/05/13: Hemoglobin A1c was 9.3%, compared with 9.3 at last visit and with 9.1% at the visit prior. All of these  values were major increases from 6.6% in May 2012.    Labs 04/14/12: 25-hydroxy vitamin D 21, iron 203,  Vitamin B12 379,   Labs 11/16/11: Hgb 10.3, Hct 32.4%; CMP normal except glucose 146' cholesterol 201, triglycerides 70, HDL 66, LDL 121; vitamin D 22           Labs 06/09/10: Cholesterol was 175, triglycerides 76, HDL 62, LDL 98. TSH was 1.217. Free T4 was 1.10. Free T3 was 2.7. 25-hydroxy vitamin D was 19. PTH was 167.  IMAGING:  BMD 09/08/19: pending   Assessment and Plan:   ASSESSMENT:  1. Type 2 diabetes mellitus:   A. During the past three years her T2DM control has waxed and waned, with HbA1c values varying from 6.9-13.4%. In the last year, however, her HbA1c values have only varied from 7.7-8.2%.    B. At her last visit her HbA1c had decreased to 7.2%. Unfortunately, her HbA1c increased to 8.5% today. This is her highest HbA1c value in the past 2 years. She has not been taking her medications as well as she had at her last visit.  C. At today's visit she has gained 2 pounds.    D. My goal is to reduce her HbA1c to <6.5% if we can safely do so.   2. Hypoglycemia: She has not had any recognized hypoglycemia for many months.  3. Obesity: Her weight has increased 2 pounds since her last visit. She needs to resume taking her medications, continue to Eat Right and exercise daily.  4-5. Autonomic neuropathy and tachycardia: The patient's autonomic neuropathy and  heart rate improved as her BGs decreased, but worsened as her BGs increased. She is doing well today, but will worsen again if she does not improve her  BGs.     6. Hypertension: Her BP is normal today. She still needs to try to fit in more exercise.  7-8. Anhedonia and anxiety: She is doing well. She still needs to call for help when she begins to do poorly. She also needs to continue to find ways to have more joy in her life. 9-12. GERD/esophagitis/difficulty swallowing/gastroparesis: Her reflux, other GI symptoms, and  difficulty swallowing have all improved.   13-15: Hypocalcemia, vitamin D deficiency, and secondary hyperparathyroidism:   A. Due to her bariatric surgery she has more difficulty absorbing vitamin D and calcium. When she develops hypocalcemia and vitamin D deficiency, her parathyroid glands appropriately produce more PTH, resulting in secondary hyperparathyroidism. However, if she takes her calcium and vitamin D all three parameters will normalize.   B. Her calcium was normal in October 2018, but at the lower limit of normal in February 2019. Her vitamin D level was low-normal in May 2019.   C. In May 2019 her PTH was elevated to 120 and her calcium was 9.7. She still had secondary hyperparathyroidism. In October 2019 her calcium was at the low end of the reference range and her PTH was still elevated, but lower.  D. Her PTH level in August 2021 was mid-normal. Her calcium was at the low end of the reference range, because she was taking less calcium than she had intended. Her vitamin D level was good. She needs to increase her calcium intake to 600 mg, three times daily and take her vitamin D and calcium  very consistently.  1. Noncompliance: Since last visit her compliance with eating right and taking her medications has markedly deteriorated.  17: Muscle cramps: Her cramps occur more infrequently now.  18. Hypothyroidism: Her TFTs in May 2019 and in December 2020 were normal.  Her current levothyroxine dosage is working well.  19. Sleeping difficulties: These problems may have worsened a bit. I suspect that her increased caffeine intake is causing much of these problems.  20. Osteopenia/osteoporosis: Neesa's secondary hyperparathyroidism has normalized. She had a new BMD study in August. Her osteoporosis was somewhat worse.   PLAN:  1. Diagnostic: I reviewed her lab results from August 2021 and her renal US and BMD with her. Repeat her annual surveillance labs plus her PTH, calcium, and vitamin D  today.    2. Therapeutic: Continue medications as currently prescribed, to include metformin doses of 1000 mg in the mornings and 1000 mg in the evenings. Continue Synthroid at bedtime. Take 600 mg of calcium at mealtimes. Take [employer], 50,000 IU per week to improve her vitamin D level. Try to fit in exercise daily.  See Alexa [name] in follow up.    3. Patient education: We discussed all of the above at great length. She must continue to take her medications and supplements. She also needs to see me every two-three months. When she knows that she is coming to see "[facility] Re[facility]", her compliance sometimes improves.  4. Follow-up:  3 months   Level of Service: This visit lasted in excess of 60 minutes. More than 50% of the visit was devoted to counseling.   Tillman Sers, MD, CDE Adult and Pediatric Endocrinology 12/11/2019 11:01 AM

## 2019-12-14 LAB — LIPID PANEL
Cholesterol: 181 mg/dL (ref <200)
HDL: 65 mg/dL (ref >=50)
LDL Cholesterol (Calc): 96 mg/dL (calc)
Non-HDL Cholesterol (Calc): 116 mg/dL (calc) (ref <130)
Total CHOL/HDL Ratio: 2.8 (calc) (ref <5.0)
Triglycerides: 103 mg/dL (ref <150)

## 2019-12-14 LAB — VITAMIN D 1,25 DIHYDROXY
Vitamin D 1, 25 (OH)2 Total: 37 pg/mL (ref 18–72)
Vitamin D2 1, 25 (OH)2: <8 pg/mL
Vitamin D3 1, 25 (OH)2: 37 pg/mL

## 2019-12-14 LAB — COMPREHENSIVE METABOLIC PANEL
AG Ratio: 2.2 (calc) (ref 1.0–2.5)
ALT: 10 U/L (ref 6–29)
AST: 13 U/L (ref 10–35)
Albumin: 4.1 g/dL (ref 3.6–5.1)
Alkaline phosphatase (APISO): 61 U/L (ref 37–153)
BUN: 14 mg/dL (ref 7–25)
CO2: 25 mmol/L (ref 20–32)
Calcium: 9.1 mg/dL (ref 8.6–10.4)
Chloride: 104 mmol/L (ref 98–110)
Creat: 0.85 mg/dL (ref 0.50–1.05)
Globulin: 1.9 g/dL (calc) (ref 1.9–3.7)
Glucose, Bld: 105 mg/dL — ABNORMAL HIGH (ref 65–99)
Potassium: 4.6 mmol/L (ref 3.5–5.3)
Sodium: 138 mmol/L (ref 135–146)
Total Bilirubin: 0.3 mg/dL (ref 0.2–1.2)
Total Protein: 6 g/dL — ABNORMAL LOW (ref 6.1–8.1)

## 2019-12-14 LAB — TSH: TSH: 1.95 mIU/L (ref 0.40–4.50)

## 2019-12-14 LAB — PTH, INTACT AND CALCIUM
Calcium: 9.1 mg/dL (ref 8.6–10.4)
PTH: 66 pg/mL — ABNORMAL HIGH (ref 14–64)

## 2019-12-14 LAB — MICROALBUMIN / CREATININE URINE RATIO
Creatinine, Urine: 131 mg/dL (ref 20–275)
Microalb Creat Ratio: 2 mcg/mg creat (ref <30)
Microalb, Ur: 0.3 mg/dL

## 2019-12-14 LAB — T4, FREE: Free T4: 1.1 ng/dL (ref 0.8–1.8)

## 2019-12-14 LAB — T3, FREE: T3, Free: 2.5 pg/mL (ref 2.3–4.2)

## 2019-12-14 NOTE — Progress Notes (Signed)
Chief Complaint:   OBESITY Alexa [name] is here to discuss her progress with her obesity treatment plan along with follow-up of her obesity related diagnoses. Alexa [name] is on practicing portion control and making smarter food choices, such as increasing vegetables and decreasing simple carbohydrates and states she is following her eating plan approximately 60% of the time. Alexa [name] states she is exercising 0 minutes 0 times per week.  Today's visit was #: 51 Starting weight: 169 lbs Starting date: 11/17/2019 Today's weight: 172 lbs Today's date: 12/11/2019 Total lbs lost to date: 0 Total lbs lost since last in-office visit: +3 Total weight loss percentage to date: 1.78%  Interim History: Alexa [name]'s last office visit was with NP Alexa [name] on 09/18/2019. She was told to follow up in 8 weeks. Alexa [name] is not following the plan. She is skipping meals and drinking a lot more soda than usual.  Plan: Alexa [name] will continue medication as written. Saxenda refill not needed, per patient.   Assessment/Plan:   1. Vitamin D deficiency NP Alexa [name] added 2,000 UI of Vit D daily to the once weekly Vit D prescription at last office visit. Alexa [name] continues with the weekly Vit D as well.  Tol well w/o issues  Plan:  - Reiterated importance of vitamin D (as well as calcium) to their health and wellbeing.  - Reminded pt that weight loss will likely improve availability of vitamin D, thus encouraged [name] to continue with meal plan and their weight loss efforts to further improve this condition. - I recommend pt continue to take weekly prescription vit D 50,000 IU - Informed patient this may be a lifelong thing, and she was encouraged to continue to take the medicine until told otherwise.   - We will need to monitor levels regularly (every 3-4 mo on average) to keep levels within normal limits.  - pt's questions and concerns regarding this condition addressed.   2. Type 2 diabetes mellitus with other specified  complication, without long-term current use of insulin (HCC) Alexa [name] was seen by her endocrinologist today. Her A1c is 8.5.  She is not taking medication as prescribed.   Also, she has been eating poorly and has increased regular soda/coke in order "to keep her energy levels up."  Lab Results  Component Value Date   HGBA1C 8.5 (A) 12/11/2019   HGBA1C 7.2 (A) 09/08/2019   HGBA1C 7.7 (A) 06/02/2019   Lab Results  Component Value Date   INSULIN 8.1 11/16/2017   Lab Results  Component Value Date   MICROALBUR 0.3 12/11/2019   Wilber 96 12/11/2019   CREATININE 0.85 12/11/2019    Plan:  Reviewed with pt:  - Counseled patient on pathophysiology of disease and discussed good blood sugar control is important to decrease the likelihood of diabetic complications such as nephropathy, neuropathy, limb loss, blindness, coronary artery disease, and death.  Intensive lifestyle modification including diet, exercise and weight loss are the first line of treatment for diabetes.    - Continue home blood sugar monitoring regularly - closely- especially with continued wt loss.   Reviewed blood sugar goals.  Reminded if pt feels poorly- check BS and BP at that time.  Bring in BS and BP logs each OV. - Eat on a regular basis- no skipping or going long periods without eating.  We discussed hypoglycemia prevention. - Any concerns about medicines should be directed at the prescribing provider - Treatment plan, per Endocrinology.  - Continue Saxenda and metformin. Alexa [name] denies need for refills.  -  Recheck labs in 3 months if not done at Endo/ PCP.  - Importance of f/up with PCP and all other specialists as scheduled was stressed to pt today - Pt should contact their endocrinologist or PCP as they see fit with any Q's/ concerns with what we discuss.     3. Class 1 obesity with serious comorbidity and body mass index (BMI) of 30.0 to 30.9 in adult, unspecified obesity type Alexa [name] is currently in the action  stage of change. As such, her goal is to continue with weight loss efforts. She has agreed to change to  the Category 1 Plan.  - Pt tells me today she needs structure with a meal plan.   Exercise goals: All adults should avoid inactivity. Some physical activity is better than none, and adults who participate in any amount of physical activity gain some health benefits.   Behavioral modification strategies: increasing lean protein intake, decreasing simple carbohydrates, increasing water intake, meal planning and cooking strategies, better snacking choices, holiday eating strategies  and planning for success.   Jakara has agreed to follow-up with our clinic in 2-3 weeks. She was advised to come in 30 minutes early fasting for an IC.  She may need fasting blood work also; will draw appropriate labs as provider sees fit at that time.     She was informed of the importance of frequent follow-up visits to maximize her success with intensive lifestyle modifications for her multiple health conditions.   Objective:   Blood pressure 117/74, pulse 85, temperature (!) 97.5 F (36.4 C), height 5\' 3"  (1.6 m), weight 172 lb (78 kg), last menstrual period 05/11/2010, SpO2 98 %. Body mass index is 30.47 kg/m.  General: Cooperative, alert, well developed, in no acute distress. HEENT: Conjunctivae and lids unremarkable. Cardiovascular: Regular rhythm.  Lungs: Normal work of breathing. Neurologic: No focal deficits.   Lab Results  Component Value Date   CREATININE 0.85 12/11/2019   BUN 14 12/11/2019   NA 138 12/11/2019   K 4.6 12/11/2019   CL 104 12/11/2019   CO2 25 12/11/2019   Lab Results  Component Value Date   ALT 10 12/11/2019   AST 13 12/11/2019   ALKPHOS 85 05/27/2016   BILITOT 0.3 12/11/2019   Lab Results  Component Value Date   HGBA1C 8.5 (A) 12/11/2019   HGBA1C 7.2 (A) 09/08/2019   HGBA1C 7.7 (A) 06/02/2019   HGBA1C 7.7 (A) 02/02/2019   HGBA1C 8.2 (A) 09/08/2018   Lab  Results  Component Value Date   INSULIN 8.1 11/16/2017   Lab Results  Component Value Date   TSH 1.95 12/11/2019   Lab Results  Component Value Date   CHOL 181 12/11/2019   HDL 65 12/11/2019   LDLCALC 96 12/11/2019   TRIG 103 12/11/2019   CHOLHDL 2.8 12/11/2019   Lab Results  Component Value Date   WBC 5.5 03/27/2019   HGB 12.1 03/27/2019   HCT 36.8 03/27/2019   MCV 84.8 03/27/2019   PLT 316 03/27/2019   Lab Results  Component Value Date   IRON 55 05/08/2013   TIBC 357 04/14/2012    Attestation Statements:   Reviewed by clinician on day of visit: allergies, medications, problem list, medical history, surgical history, family history, social history, and previous encounter notes.  Time spent on visit including pre-visit chart review and post-visit care and charting was 30 minutes.   Coral Ceo, am acting as Location manager for [employer], DO.  I have reviewed the above  documentation for accuracy and completeness, and I agree with the above. Marjory Sneddon, D.O.  The [place] was signed into law in 2016 which includes the topic of electronic health records.  This provides immediate access to information in MyChart.  This includes consultation notes, operative notes, office notes, lab results and pathology reports.  If you have any questions about what you read please let us know at your next visit so we can discuss your concerns and take corrective action if need be.  We are right here with you.

## 2019-12-25 MED FILL — ALPRAZolam 0.5 MG TABS: 0.5 | 30 days supply | Qty: 60 | Fill #3

## 2019-12-25 MED FILL — FLUCONAZOLE 150 MG TABS: 150 | 28 days supply | Qty: 4 | Fill #1

## 2019-12-26 NOTE — Telephone Encounter (Signed)
Left detailed message.  Needs to book CPE.

## 2020-01-04 ENCOUNTER — Encounter (INDEPENDENT_AMBULATORY_CARE_PROVIDER_SITE_OTHER): Payer: Self-pay | Admitting: Family Medicine

## 2020-01-04 ENCOUNTER — Other Ambulatory Visit: Payer: Self-pay

## 2020-01-04 ENCOUNTER — Ambulatory Visit (INDEPENDENT_AMBULATORY_CARE_PROVIDER_SITE_OTHER): Payer: No Typology Code available for payment source | Admitting: Family Medicine

## 2020-01-04 VITALS — BP 111/76 | HR 81 | Temp 98.1°F | Ht 63.0 in | Wt 170.0 lb

## 2020-01-04 DIAGNOSIS — E1169 Type 2 diabetes mellitus with other specified complication: Secondary | ICD-10-CM

## 2020-01-04 DIAGNOSIS — E559 Vitamin D deficiency, unspecified: Secondary | ICD-10-CM | POA: Diagnosis not present

## 2020-01-04 DIAGNOSIS — Z6830 Body mass index (BMI) 30.0-30.9, adult: Secondary | ICD-10-CM

## 2020-01-04 DIAGNOSIS — Z9189 Other specified personal risk factors, not elsewhere classified: Secondary | ICD-10-CM

## 2020-01-04 DIAGNOSIS — R0602 Shortness of breath: Secondary | ICD-10-CM

## 2020-01-04 DIAGNOSIS — Z9884 Bariatric surgery status: Secondary | ICD-10-CM

## 2020-01-04 DIAGNOSIS — E669 Obesity, unspecified: Secondary | ICD-10-CM

## 2020-01-04 MED ORDER — SAXENDA 18 MG/3ML ~~LOC~~ SOPN: 3.0000 mg | PEN_INJECTOR | Freq: Every day | SUBCUTANEOUS | 2 refills | Status: DC

## 2020-01-04 MED FILL — SAXENDA 18 MG/3 ML PEN: 18 | 30 days supply | Qty: 15 | Fill #2

## 2020-01-08 ENCOUNTER — Other Ambulatory Visit: Payer: Self-pay | Admitting: Internal Medicine

## 2020-01-08 MED FILL — ESCITALOPRAM 20 MG TABLET: 20 | 90 days supply | Qty: 90 | Fill #0

## 2020-01-08 NOTE — Progress Notes (Signed)
Chief Complaint:   OBESITY Alexa [name] is here to discuss her progress with her obesity treatment plan along with follow-up of her obesity related diagnoses.   Today's visit was #: 18 Starting weight: 169 lbs Starting date: 11/17/2019 Today's weight: 170 lbs Today's date: 01/04/2020 Total lbs lost to date: +1 lb Body mass index is 30.11 kg/m.   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  She has a history of bariatric surgery and now has osteoporosis.  She reports having an all or nothing mentality.  She is drinking regular soda to stay awake. Nutrition Plan: the Category 1 Plan for 75% of the time.  Anti-obesity medications: Saxenda and metformin. Reported side effects: None. Hunger is well controlled. Cravings are well controlled.  Activity: She says she is doing more activity.  Assessment/Plan:   1. SOB (shortness of breath) on exertion [name] complains of shortness of breath on exertion.  IC today showed RMR of 1548.  Previous 1851.  She is on Category 1. Calorie and protein goals reviewed.   2. Vitamin D deficiency Not at goal. Current vitamin D is 37.0, tested on 12/11/2019. Optimal goal > 50 ng/dL.   Plan:  [x]   Continue Vitamin D @50 ,000 IU every week. []   Continue home supplement daily. [x]   Follow-up for routine testing of Vitamin D at least 2-3 times per year to avoid over-replacement.  3. Type 2 diabetes mellitus with other specified complication, without long-term current use of insulin (HCC) Diabetes Mellitus: needs improvement. Medication: metformin 1,000 mg twice daily. Issues reviewed with her: blood sugar goals, complications of diabetes mellitus, hypoglycemia prevention and treatment, exercise, and nutrition.  Lab Results  Component Value Date   HGBA1C 8.5 (A) 12/11/2019   HGBA1C 7.2 (A) 09/08/2019   HGBA1C 7.7 (A) 06/02/2019   Lab Results  Component Value Date   MICROALBUR 0.3 12/11/2019   LDLCALC 96 12/11/2019   CREATININE 0.85 12/11/2019    4. History of Roux-en-Y gastric bypass In 2008.  She maintained her weight for 7 years.  She is at risk for malnutrition due to her previous bariatric surgery.   Plan:  Protein drink, strength training, no sodas, bariatric multivitamin.  5. At risk for activity intolerance Alexa [name] was given approximately 15 minutes of exercise intolerance counseling today. She is 59 y.o. female and has risk factors exercise intolerance including obesity and osteoporosis. We discussed intensive lifestyle modifications today with an emphasis on specific weight loss instructions and strategies. Alexa [name] will slowly increase activity as tolerated.  6. Class 1 obesity with serious comorbidity and body mass index (BMI) of 30.0 to 30.9 in adult, unspecified obesity type  - Refill Liraglutide -Weight Management (SAXENDA) 18 MG/3ML SOPN; Inject 3 mg into the skin daily.  Dispense: 15 mL; Refill: 2  Course: Alexa [name] is currently in the action stage of change. As such, her goal is to continue with weight loss efforts.   Nutrition goals: She has agreed to the Category 1 Plan.   Exercise goals: For substantial health benefits, adults should do at least 150 minutes (2 hours and 30 minutes) a week of moderate-intensity, or 75 minutes (1 hour and 15 minutes) a week of vigorous-intensity aerobic physical activity, or an equivalent combination of moderate- and vigorous-intensity aerobic activity. Aerobic activity should be performed in episodes of at least 10 minutes, and preferably, it should be spread throughout the week.  Behavioral modification strategies: increasing lean protein intake, decreasing simple carbohydrates and increasing vegetables.  Alexa [name] has agreed to  follow-up with our clinic in 4 weeks. She was informed of the importance of frequent follow-up visits to maximize her success with intensive lifestyle modifications for her multiple health conditions.   Objective:   Blood pressure 111/76, pulse 81, temperature  98.1 F (36.7 C), temperature source Oral, height 5\' 3"  (1.6 m), weight 170 lb (77.1 kg), last menstrual period 05/11/2010, SpO2 98 %. Body mass index is 30.11 kg/m.  General: Cooperative, alert, well developed, in no acute distress. HEENT: Conjunctivae and lids unremarkable. Cardiovascular: Regular rhythm.  Lungs: Normal work of breathing. Neurologic: No focal deficits.   Lab Results  Component Value Date   CREATININE 0.85 12/11/2019   BUN 14 12/11/2019   NA 138 12/11/2019   K 4.6 12/11/2019   CL 104 12/11/2019   CO2 25 12/11/2019   Lab Results  Component Value Date   ALT 10 12/11/2019   AST 13 12/11/2019   ALKPHOS 85 05/27/2016   BILITOT 0.3 12/11/2019   Lab Results  Component Value Date   HGBA1C 8.5 (A) 12/11/2019   HGBA1C 7.2 (A) 09/08/2019   HGBA1C 7.7 (A) 06/02/2019   HGBA1C 7.7 (A) 02/02/2019   HGBA1C 8.2 (A) 09/08/2018   Lab Results  Component Value Date   INSULIN 8.1 11/16/2017   Lab Results  Component Value Date   TSH 1.95 12/11/2019   Lab Results  Component Value Date   CHOL 181 12/11/2019   HDL 65 12/11/2019   LDLCALC 96 12/11/2019   TRIG 103 12/11/2019   CHOLHDL 2.8 12/11/2019   Lab Results  Component Value Date   WBC 5.5 03/27/2019   HGB 12.1 03/27/2019   HCT 36.8 03/27/2019   MCV 84.8 03/27/2019   PLT 316 03/27/2019   Lab Results  Component Value Date   IRON 55 05/08/2013   TIBC 357 04/14/2012   Attestation Statements:   Reviewed by clinician on day of visit: allergies, medications, problem list, medical history, surgical history, family history, social history, and previous encounter notes.  I, Water quality scientist, CMA, am acting as transcriptionist for Briscoe Deutscher, DO  I have reviewed the above documentation for accuracy and completeness, and I agree with the above. Briscoe Deutscher, DO

## 2020-01-23 MED FILL — ALPRAZolam 0.5 MG TABS: 0.5 | 30 days supply | Qty: 60 | Fill #4

## 2020-01-28 ENCOUNTER — Encounter (INDEPENDENT_AMBULATORY_CARE_PROVIDER_SITE_OTHER): Payer: Self-pay | Admitting: Physician Assistant

## 2020-01-29 ENCOUNTER — Telehealth (INDEPENDENT_AMBULATORY_CARE_PROVIDER_SITE_OTHER): Payer: 59 | Admitting: Physician Assistant

## 2020-01-29 DIAGNOSIS — Z6830 Body mass index (BMI) 30.0-30.9, adult: Secondary | ICD-10-CM

## 2020-01-29 DIAGNOSIS — E669 Obesity, unspecified: Secondary | ICD-10-CM

## 2020-01-29 DIAGNOSIS — I10 Essential (primary) hypertension: Secondary | ICD-10-CM

## 2020-01-30 MED FILL — FLUCONAZOLE 150 MG TABS: 150 | 28 days supply | Qty: 4 | Fill #2

## 2020-01-31 ENCOUNTER — Other Ambulatory Visit (INDEPENDENT_AMBULATORY_CARE_PROVIDER_SITE_OTHER): Payer: Self-pay | Admitting: Physician Assistant

## 2020-01-31 MED ORDER — SAXENDA 18 MG/3ML ~~LOC~~ SOPN: 3.0000 mg | PEN_INJECTOR | Freq: Every day | SUBCUTANEOUS | 0 refills | Status: DC

## 2020-01-31 MED FILL — SAXENDA 18 MG/3 ML PEN: 18 | 30 days supply | Qty: 15 | Fill #0

## 2020-01-31 NOTE — Progress Notes (Signed)
TeleHealth Visit:  Due to the COVID-19 pandemic, this visit was completed with telemedicine (audio/video) technology to reduce patient and provider exposure as well as to preserve personal protective equipment.   Alexa [name] has verbally consented to this TeleHealth visit. The patient is located at home, the provider is located at the [facility] office. The participants in this visit include the listed provider and patient. Alexa [name] was unable to use realtime audiovisual technology today and the telehealth visit was conducted via telephone.   Chief Complaint: OBESITY Alexa [name] is here to discuss her progress with her obesity treatment plan along with follow-up of her obesity related diagnoses. Alexa [name] is on the Category 1 Plan and states she is following her eating plan approximately 0% of the time. Alexa [name] states she is doing 0 minutes 0 times per week.  Today's visit was #: 19 Starting weight: 169 lbs Starting date: 11/17/2019  Interim History: Alexa [name] states that she has not been following the plan at all, and she has not been taking her medicine. She reports that she is an "all or nothing" person when it comes to her life. She has been drinking a lot of soda and doing some stress eating.   Subjective:   1. Essential hypertension Alexa [name]'s blood pressure was unable to be checked in the office today due to virtual visit. She is on carvedilol, and she denies headache or chest pain. She states her blood pressure was 122/81.  Assessment/Plan:   1. Essential hypertension Alexa [name] will continue her medications, and continue to monitor her blood pressure. She will continue working on healthy weight loss and exercise to improve blood pressure control. We will watch for signs of hypotension as she continues her lifestyle modifications.  2. Class 1 obesity with serious comorbidity and body mass index (BMI) of 30.0 to 30.9 in adult, unspecified obesity type Alexa [name] is currently in the action  stage of change. As such, her goal is to continue with weight loss efforts. She has agreed to the Category 1 Plan.   We discussed various medication options to help Alexa [name] with her weight loss efforts and we both agreed to continue Saxenda, and we will refill at 3 mg SubQ daily #5 pens for 1 month.  Exercise goals: No exercise has been prescribed at this time.  Behavioral modification strategies: emotional eating strategies and planning for success.  Alexa [name] has agreed to follow-up with our clinic in 2 to 3 weeks with [facility], FNP-C. She was informed of the importance of frequent follow-up visits to maximize her success with intensive lifestyle modifications for her multiple health conditions.  Objective:   VITALS: Per patient if applicable, see vitals. GENERAL: Alert and in no acute distress. CARDIOPULMONARY: No increased WOB. Speaking in clear sentences.  PSYCH: Pleasant and cooperative. Speech normal rate and rhythm. Affect is appropriate. Insight and judgement are appropriate. Attention is focused, linear, and appropriate.  NEURO: Oriented as arrived to appointment on time with no prompting.   Lab Results  Component Value Date   CREATININE 0.85 12/11/2019   BUN 14 12/11/2019   NA 138 12/11/2019   K 4.6 12/11/2019   CL 104 12/11/2019   CO2 25 12/11/2019   Lab Results  Component Value Date   ALT 10 12/11/2019   AST 13 12/11/2019   ALKPHOS 85 05/27/2016   BILITOT 0.3 12/11/2019   Lab Results  Component Value Date   HGBA1C 8.5 (A) 12/11/2019   HGBA1C 7.2 (A) 09/08/2019   HGBA1C 7.7 (A)  06/02/2019   HGBA1C 7.7 (A) 02/02/2019   HGBA1C 8.2 (A) 09/08/2018   Lab Results  Component Value Date   INSULIN 8.1 11/16/2017   Lab Results  Component Value Date   TSH 1.95 12/11/2019   Lab Results  Component Value Date   CHOL 181 12/11/2019   HDL 65 12/11/2019   LDLCALC 96 12/11/2019   TRIG 103 12/11/2019   CHOLHDL 2.8 12/11/2019   Lab Results  Component Value Date    WBC 5.5 03/27/2019   HGB 12.1 03/27/2019   HCT 36.8 03/27/2019   MCV 84.8 03/27/2019   PLT 316 03/27/2019   Lab Results  Component Value Date   IRON 55 05/08/2013   TIBC 357 04/14/2012    Attestation Statements:   Reviewed by clinician on day of visit: allergies, medications, problem list, medical history, surgical history, family history, social history, and previous encounter notes.   Wilhemena Durie, am acting as transcriptionist for [employer], PA-C.  I have reviewed the above documentation for accuracy and completeness, and I agree with the above. Abby Potash, PA-C

## 2020-02-09 DIAGNOSIS — H524 Presbyopia: Secondary | ICD-10-CM | POA: Diagnosis not present

## 2020-02-09 DIAGNOSIS — H353121 Nonexudative age-related macular degeneration, left eye, early dry stage: Secondary | ICD-10-CM | POA: Diagnosis not present

## 2020-02-09 DIAGNOSIS — H52203 Unspecified astigmatism, bilateral: Secondary | ICD-10-CM | POA: Diagnosis not present

## 2020-02-09 LAB — HM DIABETES EYE EXAM

## 2020-02-13 ENCOUNTER — Encounter: Payer: Self-pay | Admitting: Internal Medicine

## 2020-02-15 ENCOUNTER — Ambulatory Visit: Payer: No Typology Code available for payment source

## 2020-02-20 MED FILL — ALPRAZolam 0.5 MG TABS: 0.5 | 30 days supply | Qty: 60 | Fill #5

## 2020-02-22 ENCOUNTER — Ambulatory Visit (INDEPENDENT_AMBULATORY_CARE_PROVIDER_SITE_OTHER): Payer: 59 | Admitting: Family Medicine

## 2020-02-22 ENCOUNTER — Ambulatory Visit: Payer: No Typology Code available for payment source | Admitting: Gastroenterology

## 2020-02-23 DIAGNOSIS — Z03818 Encounter for observation for suspected exposure to other biological agents ruled out: Secondary | ICD-10-CM | POA: Diagnosis not present

## 2020-02-23 DIAGNOSIS — Z20822 Contact with and (suspected) exposure to covid-19: Secondary | ICD-10-CM | POA: Diagnosis not present

## 2020-03-14 ENCOUNTER — Other Ambulatory Visit: Payer: Self-pay

## 2020-03-14 ENCOUNTER — Encounter (INDEPENDENT_AMBULATORY_CARE_PROVIDER_SITE_OTHER): Payer: Self-pay | Admitting: Family Medicine

## 2020-03-14 ENCOUNTER — Other Ambulatory Visit (INDEPENDENT_AMBULATORY_CARE_PROVIDER_SITE_OTHER): Payer: Self-pay | Admitting: Family Medicine

## 2020-03-14 ENCOUNTER — Ambulatory Visit (INDEPENDENT_AMBULATORY_CARE_PROVIDER_SITE_OTHER): Payer: No Typology Code available for payment source | Admitting: "Endocrinology

## 2020-03-14 ENCOUNTER — Other Ambulatory Visit: Payer: Self-pay | Admitting: Internal Medicine

## 2020-03-14 ENCOUNTER — Ambulatory Visit (INDEPENDENT_AMBULATORY_CARE_PROVIDER_SITE_OTHER): Payer: 59 | Admitting: Family Medicine

## 2020-03-14 VITALS — BP 126/82 | HR 79 | Temp 97.5°F | Ht 63.0 in | Wt 178.0 lb

## 2020-03-14 DIAGNOSIS — F419 Anxiety disorder, unspecified: Secondary | ICD-10-CM | POA: Diagnosis not present

## 2020-03-14 DIAGNOSIS — F32A Depression, unspecified: Secondary | ICD-10-CM

## 2020-03-14 DIAGNOSIS — F39 Unspecified mood [affective] disorder: Secondary | ICD-10-CM | POA: Diagnosis not present

## 2020-03-14 DIAGNOSIS — Z9189 Other specified personal risk factors, not elsewhere classified: Secondary | ICD-10-CM | POA: Diagnosis not present

## 2020-03-14 DIAGNOSIS — E669 Obesity, unspecified: Secondary | ICD-10-CM | POA: Diagnosis not present

## 2020-03-14 DIAGNOSIS — E1140 Type 2 diabetes mellitus with diabetic neuropathy, unspecified: Secondary | ICD-10-CM

## 2020-03-14 DIAGNOSIS — Z6830 Body mass index (BMI) 30.0-30.9, adult: Secondary | ICD-10-CM

## 2020-03-14 MED ORDER — WEGOVY 2.4 MG/0.75ML ~~LOC~~ SOAJ: 2.4000 mg | SUBCUTANEOUS | 0 refills | Status: DC

## 2020-03-18 ENCOUNTER — Encounter (INDEPENDENT_AMBULATORY_CARE_PROVIDER_SITE_OTHER): Payer: Self-pay

## 2020-03-18 NOTE — Progress Notes (Signed)
Chief Complaint:   OBESITY Alexa [name] is here to discuss her progress with her obesity treatment plan along with follow-up of her obesity related diagnoses. Alexa [name] is on the Category 1 Plan and states she is following her eating plan approximately 25% of the time. Alexa [name] states she is not exercising at this time.  Today's visit was #: 20 Starting weight: 169 lbs Starting date: 11/17/2019 Today's weight: 178 lbs Today's date: 03/14/2020 Total lbs lost to date: 0 Total lbs lost since last in-office visit: 0  Interim History: Alexa [name] says she is struggling with medication adherence as is her pattern.  She is eating too much candy and drinking soda.  She is up 8 pounds today.  She is struggling with mental health and excessive sugar intake.  She will be reducing work hours, which she feels will help because work is very stressful.  She feels she gets in adequate protein.  Subjective:   1. Type 2 diabetes mellitus with diabetic neuropathy, without long-term current use of insulin (HCC) Last A1c was 8.5.  Diabetes managed by Dr. Tobe Sos.  She is overindulging in sweets and soda.  She has poor compliance with metformin and Saxenda.  Lab Results  Component Value Date   HGBA1C 8.5 (A) 12/11/2019   HGBA1C 7.2 (A) 09/08/2019   HGBA1C 7.7 (A) 06/02/2019   Lab Results  Component Value Date   MICROALBUR 0.3 12/11/2019   LDLCALC 96 12/11/2019   CREATININE 0.85 12/11/2019   Lab Results  Component Value Date   INSULIN 8.1 11/16/2017   2. Anxiety and depression She sees a counselor monthly.  She has deep-seated issues with medication compliance and self-care.  She is on Lexapro 20 mg and Xanax.  PCP manages medications.  Poor eating goes along with stress for her.  3. At risk for medication noncompliance Alexa [name] is at a higher than average risk for medication non-adherence.  Assessment/Plan:   1. Type 2 diabetes mellitus with diabetic neuropathy, without long-term current use of insulin  (HCC) Change to [facility] to improve compliance.  Work on improving compliance with metformin.  2. Anxiety and depression Advised her to see her counselor weekly or bi-weekly.  3. At risk for medication noncompliance Alexa [name] was given approximately 15 minutes of counseling today to help her avoid medication non-adherence.  We discussed importance of taking medications at a similar time each day and the use of daily pill organizers to help improve medication adherence.  Repetitive spaced learning was employed today to elicit superior memory formation and behavioral change.  4. Class 1 obesity with serious comorbidity and body mass index (BMI) of 30.0 to 30.9 in adult, unspecified obesity type  - Semaglutide-Weight Management (WEGOVY) 2.4 MG/0.75ML SOAJ; Inject 2.4 mg into the skin once a week.  Dispense: 3 mL; Refill: 0  Alexa [name] is currently in the action stage of change. As such, her goal is to continue with weight loss efforts. She has agreed to practicing portion control and making smarter food choices, such as increasing vegetables and decreasing simple carbohydrates with 80 grams of protein daily.   Exercise goals: No exercise has been prescribed at this time.  Behavioral modification strategies: increasing lean protein intake, decreasing simple carbohydrates, increasing water intake, decreasing liquid calories, no skipping meals, meal planning and cooking strategies and emotional eating strategies.  She may have (1) 8 ounce can of soda per day.  She will get rid of all the candy that she has in the house.  Alexa [name] has agreed  to follow-up with our clinic in 2 weeks.   Objective:   Blood pressure 126/82, pulse 79, temperature (!) 97.5 F (36.4 C), height 5\' 3"  (1.6 m), weight 178 lb (80.7 kg), last menstrual period 05/11/2010, SpO2 98 %. Body mass index is 31.53 kg/m.  General: Cooperative, alert, well developed, in no acute distress. HEENT: Conjunctivae and lids  unremarkable. Cardiovascular: Regular rhythm.  Lungs: Normal work of breathing. Neurologic: No focal deficits.   Lab Results  Component Value Date   CREATININE 0.85 12/11/2019   BUN 14 12/11/2019   NA 138 12/11/2019   K 4.6 12/11/2019   CL 104 12/11/2019   CO2 25 12/11/2019   Lab Results  Component Value Date   ALT 10 12/11/2019   AST 13 12/11/2019   ALKPHOS 85 05/27/2016   BILITOT 0.3 12/11/2019   Lab Results  Component Value Date   HGBA1C 8.5 (A) 12/11/2019   HGBA1C 7.2 (A) 09/08/2019   HGBA1C 7.7 (A) 06/02/2019   HGBA1C 7.7 (A) 02/02/2019   HGBA1C 8.2 (A) 09/08/2018   Lab Results  Component Value Date   INSULIN 8.1 11/16/2017   Lab Results  Component Value Date   TSH 1.95 12/11/2019   Lab Results  Component Value Date   CHOL 181 12/11/2019   HDL 65 12/11/2019   LDLCALC 96 12/11/2019   TRIG 103 12/11/2019   CHOLHDL 2.8 12/11/2019   Lab Results  Component Value Date   WBC 5.5 03/27/2019   HGB 12.1 03/27/2019   HCT 36.8 03/27/2019   MCV 84.8 03/27/2019   PLT 316 03/27/2019   Lab Results  Component Value Date   IRON 55 05/08/2013   TIBC 357 04/14/2012   Attestation Statements:   Reviewed by clinician on day of visit: allergies, medications, problem list, medical history, surgical history, family history, social history, and previous encounter notes.  I, Water quality scientist, CMA, am acting as Location manager for [employer], [place].  I have reviewed the above documentation for accuracy and completeness, and I agree with the above. -  Georgianne Fick, FNP

## 2020-03-19 MED FILL — WEGOVY 2.4 MG/0.75ML SOAJ: 2.4 | 28 days supply | Qty: 3 | Fill #0

## 2020-03-19 MED FILL — ALPRAZolam 0.5 MG TABS: 0.5 | 30 days supply | Qty: 60 | Fill #0

## 2020-03-28 ENCOUNTER — Encounter (INDEPENDENT_AMBULATORY_CARE_PROVIDER_SITE_OTHER): Payer: Self-pay | Admitting: Family Medicine

## 2020-03-28 ENCOUNTER — Other Ambulatory Visit: Payer: Self-pay

## 2020-03-28 ENCOUNTER — Other Ambulatory Visit: Payer: 59 | Admitting: Internal Medicine

## 2020-03-28 ENCOUNTER — Ambulatory Visit (INDEPENDENT_AMBULATORY_CARE_PROVIDER_SITE_OTHER): Payer: 59 | Admitting: Family Medicine

## 2020-03-28 ENCOUNTER — Other Ambulatory Visit (INDEPENDENT_AMBULATORY_CARE_PROVIDER_SITE_OTHER): Payer: Self-pay | Admitting: Family Medicine

## 2020-03-28 VITALS — BP 113/80 | HR 73 | Temp 97.6°F | Ht 63.0 in | Wt 172.0 lb

## 2020-03-28 DIAGNOSIS — E1140 Type 2 diabetes mellitus with diabetic neuropathy, unspecified: Secondary | ICD-10-CM

## 2020-03-28 DIAGNOSIS — F32A Depression, unspecified: Secondary | ICD-10-CM | POA: Diagnosis not present

## 2020-03-28 DIAGNOSIS — Z6830 Body mass index (BMI) 30.0-30.9, adult: Secondary | ICD-10-CM | POA: Diagnosis not present

## 2020-03-28 DIAGNOSIS — E669 Obesity, unspecified: Secondary | ICD-10-CM

## 2020-03-28 DIAGNOSIS — Z9189 Other specified personal risk factors, not elsewhere classified: Secondary | ICD-10-CM | POA: Diagnosis not present

## 2020-03-28 DIAGNOSIS — F419 Anxiety disorder, unspecified: Secondary | ICD-10-CM

## 2020-03-28 DIAGNOSIS — E1159 Type 2 diabetes mellitus with other circulatory complications: Secondary | ICD-10-CM

## 2020-03-28 DIAGNOSIS — E1169 Type 2 diabetes mellitus with other specified complication: Secondary | ICD-10-CM

## 2020-03-28 DIAGNOSIS — I10 Essential (primary) hypertension: Secondary | ICD-10-CM

## 2020-03-28 MED ORDER — WEGOVY 2.4 MG/0.75ML ~~LOC~~ SOAJ
2.4000 mg | SUBCUTANEOUS | 0 refills | Status: DC
Start: 2020-03-28 — End: 2020-03-28

## 2020-04-01 NOTE — Progress Notes (Signed)
Chief Complaint:   OBESITY Alexa [name] is here to discuss her progress with her obesity treatment plan along with follow-up of her obesity related diagnoses. Alexa [name] is on practicing portion control and making smarter food choices, such as increasing vegetables and decreasing simple carbohydrates and states she is following her eating plan approximately 60% of the time. Alexa [name] states she is doing 0 minutes 0 times per week.  Today's visit was #: 21 Starting weight: 169 lbs Starting date: 11/17/2019 Today's weight: 172 lbs Today's date: 03/28/2020 Total lbs lost to date: 0 Total lbs lost since last in-office visit: 6  Interim History: Alexa [name] reports doing much better with her eating. She has reduced soda and sweets. She is compliant with her medications. Alexa [name] was started last week and she had slight upset stomach. She feels she is doing fairly well with protein and she gets 80 grams 60% of the time.  Subjective:   1. Type 2 diabetes mellitus with diabetic neuropathy, without long-term current use of insulin (HCC) Alexa [name] has reduced simple carbohydrates drastically. We switched her to [facility] from [name]. Last A1c was 8.5. Her diabetes mellitus is managed by Dr. Tobe Sos. She is compliant with Wegovy. She feels once weekly Wegovy injection will improve compliance overall.   Lab Results  Component Value Date   HGBA1C 8.5 (A) 12/11/2019   HGBA1C 7.2 (A) 09/08/2019   HGBA1C 7.7 (A) 06/02/2019   Lab Results  Component Value Date   MICROALBUR 0.3 12/11/2019   LDLCALC 96 12/11/2019   CREATININE 0.85 12/11/2019   Lab Results  Component Value Date   INSULIN 8.1 11/16/2017   2. Anxiety and depression Aby is seeing a counselor monthly, and she feels she is in much better frame of mind. She is on Lexapro 20 mg daily. She has reduced her work hours to 24 hours per week and she feels this is beneficial to her mental health.  3. At risk for malnutrition Sheyla is at increased risk for  malnutrition due to inadequate protein.  Assessment/Plan:   1. Type 2 diabetes mellitus with diabetic neuropathy, without long-term current use of insulin (HCC)  [name] will continue her medications, and will continue to follow up as directed.  2. Anxiety and depression Alexa [name] will continue to see her counselor, and will continue Lexapro.   3. At risk for malnutrition Alexa [name] was given approximately 15 minutes of counseling today regarding prevention of malnutrition and ways to meet macronutrient goals..   4. Class 1 obesity with serious comorbidity and body mass index (BMI) of 30.0 to 30.9 in adult, unspecified obesity type Alexa [name] is currently in the action stage of change. As such, her goal is to continue with weight loss efforts. She has agreed to practicing portion control and making smarter food choices, such as increasing vegetables and decreasing simple carbohydrates.   Alexa [name] is to get 80 grams of protein 5 days per week.  We discussed various medication options to help Alexa [name] with her weight loss efforts and we both agreed to continue Wegovy 2.4 mg weekly.  - Semaglutide-Weight Management (WEGOVY) 2.4 MG/0.75ML SOAJ; Inject 2.4 mg into the skin once a week.  Dispense: 3 mL; Refill: 0  Exercise goals: All adults should avoid inactivity. Some physical activity is better than none, and adults who participate in any amount of physical activity gain some health benefits.  Behavioral modification strategies: increasing lean protein intake.  Alexa [name] has agreed to follow-up with our clinic in 3 to 4 weeks.   Objective:  Blood pressure 113/80, pulse 73, temperature 97.6 F (36.4 C), height 5\' 3"  (1.6 m), weight 172 lb (78 kg), last menstrual period 05/11/2010, SpO2 97 %. Body mass index is 30.47 kg/m.  General: Cooperative, alert, well developed, in no acute distress. HEENT: Conjunctivae and lids unremarkable. Cardiovascular: Regular rhythm.  Lungs: Normal work of  breathing. Neurologic: No focal deficits.   Lab Results  Component Value Date   CREATININE 0.85 12/11/2019   BUN 14 12/11/2019   NA 138 12/11/2019   K 4.6 12/11/2019   CL 104 12/11/2019   CO2 25 12/11/2019   Lab Results  Component Value Date   ALT 10 12/11/2019   AST 13 12/11/2019   ALKPHOS 85 05/27/2016   BILITOT 0.3 12/11/2019   Lab Results  Component Value Date   HGBA1C 8.5 (A) 12/11/2019   HGBA1C 7.2 (A) 09/08/2019   HGBA1C 7.7 (A) 06/02/2019   HGBA1C 7.7 (A) 02/02/2019   HGBA1C 8.2 (A) 09/08/2018   Lab Results  Component Value Date   INSULIN 8.1 11/16/2017   Lab Results  Component Value Date   TSH 1.95 12/11/2019   Lab Results  Component Value Date   CHOL 181 12/11/2019   HDL 65 12/11/2019   LDLCALC 96 12/11/2019   TRIG 103 12/11/2019   CHOLHDL 2.8 12/11/2019   Lab Results  Component Value Date   WBC 5.5 03/27/2019   HGB 12.1 03/27/2019   HCT 36.8 03/27/2019   MCV 84.8 03/27/2019   PLT 316 03/27/2019   Lab Results  Component Value Date   IRON 55 05/08/2013   TIBC 357 04/14/2012   Attestation Statements:   Reviewed by clinician on day of visit: allergies, medications, problem list, medical history, surgical history, family history, social history, and previous encounter notes.   Wilhemena Durie, am acting as Location manager for [employer], FNP-C.  I have reviewed the above documentation for accuracy and completeness, and I agree with the above. -  Georgianne Fick, FNP

## 2020-04-02 ENCOUNTER — Encounter (INDEPENDENT_AMBULATORY_CARE_PROVIDER_SITE_OTHER): Payer: Self-pay | Admitting: Family Medicine

## 2020-04-04 ENCOUNTER — Telehealth (INDEPENDENT_AMBULATORY_CARE_PROVIDER_SITE_OTHER): Payer: Self-pay

## 2020-04-05 ENCOUNTER — Encounter: Payer: 59 | Admitting: Internal Medicine

## 2020-04-16 MED FILL — WEGOVY 2.4 MG/0.75ML SOAJ: 2.4 | 28 days supply | Qty: 3 | Fill #0

## 2020-04-16 MED FILL — ALPRAZolam 0.5 MG TABS: 0.5 | 30 days supply | Qty: 60 | Fill #1

## 2020-04-29 ENCOUNTER — Other Ambulatory Visit: Payer: Self-pay

## 2020-04-29 ENCOUNTER — Telehealth (INDEPENDENT_AMBULATORY_CARE_PROVIDER_SITE_OTHER): Payer: 59 | Admitting: Family Medicine

## 2020-04-29 ENCOUNTER — Other Ambulatory Visit (HOSPITAL_COMMUNITY): Payer: Self-pay

## 2020-04-29 ENCOUNTER — Encounter (INDEPENDENT_AMBULATORY_CARE_PROVIDER_SITE_OTHER): Payer: Self-pay | Admitting: Family Medicine

## 2020-04-29 DIAGNOSIS — E1140 Type 2 diabetes mellitus with diabetic neuropathy, unspecified: Secondary | ICD-10-CM

## 2020-04-29 DIAGNOSIS — E669 Obesity, unspecified: Secondary | ICD-10-CM | POA: Diagnosis not present

## 2020-04-29 DIAGNOSIS — F39 Unspecified mood [affective] disorder: Secondary | ICD-10-CM | POA: Diagnosis not present

## 2020-04-29 DIAGNOSIS — F418 Other specified anxiety disorders: Secondary | ICD-10-CM | POA: Diagnosis not present

## 2020-04-29 DIAGNOSIS — Z6830 Body mass index (BMI) 30.0-30.9, adult: Secondary | ICD-10-CM

## 2020-04-29 MED ORDER — SEMAGLUTIDE-WEIGHT MANAGEMENT 2.4 MG/0.75ML ~~LOC~~ SOAJ
2.4000 mg | SUBCUTANEOUS | 0 refills | Status: DC
  Filled 2020-04-29 – 2020-05-14 (×2): qty 3, 28d supply, fill #0

## 2020-05-05 ENCOUNTER — Encounter (INDEPENDENT_AMBULATORY_CARE_PROVIDER_SITE_OTHER): Payer: Self-pay

## 2020-05-06 ENCOUNTER — Telehealth: Payer: Self-pay

## 2020-05-06 ENCOUNTER — Telehealth: Payer: Self-pay | Admitting: Internal Medicine

## 2020-05-06 ENCOUNTER — Other Ambulatory Visit: Payer: Self-pay

## 2020-05-06 ENCOUNTER — Encounter (INDEPENDENT_AMBULATORY_CARE_PROVIDER_SITE_OTHER): Payer: Self-pay

## 2020-05-06 ENCOUNTER — Encounter: Payer: Self-pay | Admitting: Internal Medicine

## 2020-05-06 ENCOUNTER — Encounter (INDEPENDENT_AMBULATORY_CARE_PROVIDER_SITE_OTHER): Payer: Self-pay | Admitting: Family Medicine

## 2020-05-06 ENCOUNTER — Ambulatory Visit: Payer: 59 | Admitting: Internal Medicine

## 2020-05-06 VITALS — BP 120/80 | HR 79 | Ht 63.0 in | Wt 173.0 lb

## 2020-05-06 DIAGNOSIS — M5416 Radiculopathy, lumbar region: Secondary | ICD-10-CM | POA: Diagnosis not present

## 2020-05-06 MED ORDER — CYCLOBENZAPRINE HCL 10 MG PO TABS: 10.0000 mg | ORAL_TABLET | Freq: Three times a day (TID) | ORAL | 0 refills | Status: DC | PRN

## 2020-05-06 MED ORDER — HYDROCODONE-ACETAMINOPHEN 10-325 MG PO TABS: 1.0000 | ORAL_TABLET | Freq: Three times a day (TID) | ORAL | 0 refills | Status: AC | PRN

## 2020-05-06 MED ORDER — PREDNISONE 10 MG PO TABS: ORAL_TABLET | ORAL | 0 refills | Status: DC

## 2020-05-06 NOTE — Telephone Encounter (Signed)
Faxed Completed FMLA paperwork to [employer] [phone]  Intake # [id number]

## 2020-05-06 NOTE — Progress Notes (Signed)
TeleHealth Visit:  Due to the COVID-19 pandemic, this visit was completed with telemedicine (audio/video) technology to reduce patient and provider exposure as well as to preserve personal protective equipment.   Alexa [name] has verbally consented to this TeleHealth visit. The patient is located at home, the provider is located at the [facility] office. The participants in this visit include the listed provider and patient. The visit was conducted today via MyChart video.  Chief Complaint: OBESITY Alexa [name] is here to discuss her progress with her obesity treatment plan along with follow-up of her obesity related diagnoses. Alexa [name] is on practicing portion control and making smarter food choices, such as increasing vegetables and decreasing simple carbohydrates and states she is following her eating plan approximately 70% of the time. Alexa [name] states she is walking for 20 minutes 2 times per week.  Today's visit was #: 22 Starting weight: 169 lbs Starting date: 11/17/2019  Interim History: Alexa [name] is ill with a stomach virus, so we are doing a virtual visit today.  She feels she has lost weight and weighed 171 pounds at home today.  She is on Wegovy 2.4 mg.  She notes she is not drinking regular soda.  She is eating out less.  She gets 80 grams of protein in 60% of the time.  At work, she does not have time to eat.  For breakfast, she tends to eat cereal with [employer] milk.  Appetite is well controlled.  Subjective:   1. Type 2 diabetes mellitus with diabetic neuropathy, without long-term current use of insulin (HCC) Not well controlled. Last A1c was 8.5 on 12/11/2019.  Has Endo appointment in April.  She is on Wegovy 2.4 mg weekly.  She is compliant due to ease of once weekly injections.  She has also been compliant mostly with metformin.  Does not check CBGs at home.  Mild nausea.  Lab Results  Component Value Date   HGBA1C 8.5 (A) 12/11/2019   HGBA1C 7.2 (A) 09/08/2019   HGBA1C  7.7 (A) 06/02/2019   Lab Results  Component Value Date   MICROALBUR 0.3 12/11/2019   LDLCALC 96 12/11/2019   CREATININE 0.85 12/11/2019   Lab Results  Component Value Date   INSULIN 8.1 11/16/2017   2. Depression with anxiety She feels she is struggling a bit but looks forward to reduced work schedule in May, which will allow for more self-care.  She sees a Social worker regularly.  PCP prescribes Lexapro 20 mg daily.   Assessment/Plan:   1. Type 2 diabetes mellitus with diabetic neuropathy, without long-term current use of insulin [facility]) Follow-up with Endocrinology.  2. Depression with anxiety See counselor today.  Continue Lexapro 20 mg daily.  3. Class 1 obesity with serious comorbidity and body mass index (BMI) of 30.0 to 30.9 in adult, unspecified obesity type  - Semaglutide-Weight Management 2.4 MG/0.75ML SOAJ; Inject 2.4 mg into the skin once a week.  Dispense: 3 mL; Refill: 0  Alexa [name] is currently in the action stage of change. As such, her goal is to continue with weight loss efforts. She has agreed to practicing portion control and making smarter food choices, such as increasing vegetables and decreasing simple carbohydrates with 80 grams of protein.  We discussed breakfast portions with higher protein.   Exercise goals: As is.  Behavioral modification strategies: meal planning and cooking strategies.  Alexa [name] has agreed to follow-up with our clinic in 4 weeks.   Objective:   VITALS: Per patient if applicable, see vitals.  GENERAL: Alert and in no acute distress. CARDIOPULMONARY: No increased WOB. Speaking in clear sentences.  PSYCH: Pleasant and cooperative. Speech normal rate and rhythm. Affect is appropriate. Insight and judgement are appropriate. Attention is focused, linear, and appropriate.  NEURO: Oriented as arrived to appointment on time with no prompting.   Lab Results  Component Value Date   CREATININE 0.85 12/11/2019   BUN 14 12/11/2019   NA 138  12/11/2019   K 4.6 12/11/2019   CL 104 12/11/2019   CO2 25 12/11/2019   Lab Results  Component Value Date   ALT 10 12/11/2019   AST 13 12/11/2019   ALKPHOS 85 05/27/2016   BILITOT 0.3 12/11/2019   Lab Results  Component Value Date   HGBA1C 8.5 (A) 12/11/2019   HGBA1C 7.2 (A) 09/08/2019   HGBA1C 7.7 (A) 06/02/2019   HGBA1C 7.7 (A) 02/02/2019   HGBA1C 8.2 (A) 09/08/2018   Lab Results  Component Value Date   INSULIN 8.1 11/16/2017   Lab Results  Component Value Date   TSH 1.95 12/11/2019   Lab Results  Component Value Date   CHOL 181 12/11/2019   HDL 65 12/11/2019   LDLCALC 96 12/11/2019   TRIG 103 12/11/2019   CHOLHDL 2.8 12/11/2019   Lab Results  Component Value Date   WBC 5.5 03/27/2019   HGB 12.1 03/27/2019   HCT 36.8 03/27/2019   MCV 84.8 03/27/2019   PLT 316 03/27/2019   Lab Results  Component Value Date   IRON 55 05/08/2013   TIBC 357 04/14/2012   Attestation Statements:   Reviewed by clinician on day of visit: allergies, medications, problem list, medical history, surgical history, family history, social history, and previous encounter notes.  I, Water quality scientist, CMA, am acting as Location manager for [employer], [place].  I have reviewed the above documentation for accuracy and completeness, and I agree with the above. - Georgianne Fick, FNP

## 2020-05-06 NOTE — Telephone Encounter (Signed)
Left message will see her at 12:00pm today.

## 2020-05-06 NOTE — Telephone Encounter (Signed)
Patient has had back pain for a month she worked outside on Saturday and she could hardly move yesterday and this morning is some better she had to call out of work this morning and maybe tomorrow she will need FMLA paperwork. She would like to be seen today.

## 2020-05-06 NOTE — Telephone Encounter (Signed)
OK 

## 2020-05-06 NOTE — Progress Notes (Addendum)
   Subjective:    Patient ID: Alexa [name], female    DOB: 07-12-1960, 59 y.o.   MRN: [id number]  HPI 60 year old Female with longstanding history of chronic back pain with episodic acute exacerbations. Has been in pain now for about one week. 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 Worked particularly hard a few days ago and now has worse low back pain with right radiculopathy.  Also, needs FMLA paperwork completed for back pain.  Longstanding history of recurrent back pain for which she takes tramadol sparingly.  History of type 2 diabetes mellitus seen by Dr. Tobe Sos.  Attends [facility] for weight loss management.  History of gastric bypass surgery in 2008.  Takes B12 due to history of B12 deficiency with that surgery.  Patient was seen in January 2020 for recurrent left sciatica.  Had right-sided sciatica in 2017, right-sided sciatica in 2015 and left lumbar radiculopathy in December 2019.  Generally prefers not to go to physical therapy but usually responds to medication and rest.    Review of Systems Having excruciating pain. Cannot sit for prolonged periods of time even in the exam room today.     Objective:   Physical Exam Blood pressure 120/80 pulse 79 pulse oximetry 99% weight 173 pounds height 5 feet 3 inches BMI 30.65  Straight leg raising is positive on the right and negative on the left.  Muscle strength is normal in the lower extremities.  She is moving very slowly and has difficulty with prolonged sitting.  Decreased range of motion of the trunk.       Assessment & Plan:  Acute right-sided sciatica  Plan: She has never had an MRI of her back and has these exacerbations from time to time.  Have ordered an MRI of her back.  She will take prednisone in tapering course starting with 60 mg day 1 and decreasing by 10 mg every 2 days.  Have given 5-day quantity of Norco 10/325 number 15 tablets to take 1/2 to 1 tablet with food sparingly up  to 3 times daily as needed for back pain.  Continue Flexeril as needed.  FMLA paperwork will be completed.  Patient was advised to be out of work this week.  She only has 2 x 12-hour shifts but she is simply not up to doing prolonged standing and being on her feet for 12 hours at a time.

## 2020-05-06 NOTE — Patient Instructions (Addendum)
FMLA paperwork completed. To have MRI of LS spine. Take prednisone as directed in 12 day tapering course 6-6-5-5-4-4-3-3-2-2-1-1.  Take Flexeril as needed for back spasms.  Take Norco 10/325 1/2 tablet every 8 hours as needed for severe back pain.  Note to be out of work this week.  She has to 12-hour shifts.

## 2020-05-06 NOTE — Telephone Encounter (Signed)
Mailed copy to patient.  

## 2020-05-07 ENCOUNTER — Ambulatory Visit
Admission: RE | Admit: 2020-05-07 | Discharge: 2020-05-07 | Disposition: A | Discharge status: Home / Self Care | Payer: 59 | Source: Ambulatory Visit | Attending: Internal Medicine | Admitting: Internal Medicine

## 2020-05-07 ENCOUNTER — Telehealth: Payer: Self-pay | Admitting: "Endocrinology

## 2020-05-07 DIAGNOSIS — M5416 Radiculopathy, lumbar region: Secondary | ICD-10-CM

## 2020-05-07 DIAGNOSIS — M4726 Other spondylosis with radiculopathy, lumbar region: Secondary | ICD-10-CM | POA: Diagnosis not present

## 2020-05-07 DIAGNOSIS — M5116 Intervertebral disc disorders with radiculopathy, lumbar region: Secondary | ICD-10-CM | POA: Diagnosis not present

## 2020-05-07 NOTE — Telephone Encounter (Signed)
1. [facility] NP switched [place] from [place] to injectable Wegovy. Shaterrica took the first dose of Wegovy on Sunday, March 27th. She had more nausea and massive diarrhea for several days. After the second dose on 04/28/20 she had severe nausea, vomiting, and diarrhea. She had to stay home from work on 4/04, 4/05, and 4/06. She continued to have nausea for the rest of the week. On 05/06/20 she took the third dose. She has had nausea and one diarrheal episode.  She has much less desire for food. She did not have these symptoms when she took [name].  2. Try to continue on the [facility].  3. I'll fill out the paperwork for FMLA.  4. She had an MRI today. She was given oral steroids for back pain by Dr. Renold Genta. She also has some flexeril an [place].  5. She will soon cut back working to twice a week.   6. Her depression is worse due to her back pain and her dad's illness.  She is also struggling at work.  7. Follow up with me in two weeks.  Tillman Sers, MD, CDE

## 2020-05-14 ENCOUNTER — Telehealth: Payer: Self-pay

## 2020-05-14 ENCOUNTER — Encounter (INDEPENDENT_AMBULATORY_CARE_PROVIDER_SITE_OTHER): Payer: Self-pay

## 2020-05-14 ENCOUNTER — Other Ambulatory Visit (HOSPITAL_COMMUNITY): Payer: Self-pay

## 2020-05-14 NOTE — Telephone Encounter (Signed)
This arm issue is new. She will need a 20 minute appt Thursday.

## 2020-05-14 NOTE — Telephone Encounter (Signed)
Patient called has finished the prednisone, she said it helped her back a little but she is till hurting. Now she has neck pain radiating to her arms, has arm numbness is greater on the left. She doesn't feel like she is ready to go back to work. She would like to be seen.

## 2020-05-16 ENCOUNTER — Encounter: Payer: Self-pay | Admitting: Internal Medicine

## 2020-05-16 ENCOUNTER — Other Ambulatory Visit: Payer: Self-pay

## 2020-05-16 ENCOUNTER — Ambulatory Visit: Payer: 59 | Admitting: Internal Medicine

## 2020-05-16 VITALS — BP 120/90 | HR 105 | Ht 63.0 in | Wt 167.0 lb

## 2020-05-16 DIAGNOSIS — E1169 Type 2 diabetes mellitus with other specified complication: Secondary | ICD-10-CM

## 2020-05-16 DIAGNOSIS — I10 Essential (primary) hypertension: Secondary | ICD-10-CM | POA: Diagnosis not present

## 2020-05-16 DIAGNOSIS — F32A Depression, unspecified: Secondary | ICD-10-CM

## 2020-05-16 DIAGNOSIS — Z0000 Encounter for general adult medical examination without abnormal findings: Secondary | ICD-10-CM | POA: Diagnosis not present

## 2020-05-16 DIAGNOSIS — E063 Autoimmune thyroiditis: Secondary | ICD-10-CM

## 2020-05-16 DIAGNOSIS — R768 Other specified abnormal immunological findings in serum: Secondary | ICD-10-CM | POA: Diagnosis not present

## 2020-05-16 DIAGNOSIS — R200 Anesthesia of skin: Secondary | ICD-10-CM

## 2020-05-16 DIAGNOSIS — M542 Cervicalgia: Secondary | ICD-10-CM

## 2020-05-16 DIAGNOSIS — E1140 Type 2 diabetes mellitus with diabetic neuropathy, unspecified: Secondary | ICD-10-CM | POA: Diagnosis not present

## 2020-05-16 DIAGNOSIS — E038 Other specified hypothyroidism: Secondary | ICD-10-CM

## 2020-05-16 DIAGNOSIS — M5412 Radiculopathy, cervical region: Secondary | ICD-10-CM

## 2020-05-16 DIAGNOSIS — F418 Other specified anxiety disorders: Secondary | ICD-10-CM

## 2020-05-16 DIAGNOSIS — F419 Anxiety disorder, unspecified: Secondary | ICD-10-CM

## 2020-05-16 DIAGNOSIS — M549 Dorsalgia, unspecified: Secondary | ICD-10-CM

## 2020-05-16 DIAGNOSIS — G8929 Other chronic pain: Secondary | ICD-10-CM

## 2020-05-16 NOTE — Progress Notes (Signed)
   Subjective:    Patient ID: Alexa [name], female    DOB: 1960/08/03, 59 y.o.   MRN: [id number]  HPI 60 year old Female seen for follow up of back pain. Recent MRI of LS spine shows no herniated disc.  Out of work for back pain and has improved but not 100% better. No radiculopathy in lumbar region. Reviewed lumbar MRI results done on April 12.  Complaining of neck pain and paresthesias in arms and hands as well as feet.  Had B12 level drawn last year and also today.  Today's level was normal at 1,007.  Last year's level was 330.  She probably can cut down on B12 supplementation.  Her level is near the upper limits of normal.  She has had numerous rheumatology studies drawn today including anti-Smith antibody and anti-DNA antibody both of which were normal and would suggest she does not have lupus.  ANA was weakly positive with titer of 1: 40 which is not unusual in females.  Pattern was nucleolar/homogeneous.  CCP level was normal as well as rheumatoid factor.  Sed rate was normal at 6.      Review of Systems numbness and tingling in feet and also hands. Had B12 level drawn last year.  Rheumatology studies to be drawn. May need nerve conduction studies on UEs.     Objective:   Physical Exam  Straight leg raising is negative at 90 degrees.  Muscle strength in the lower extremities is normal.  She is tender in her shoulders.  Tenderness extends down upper extremities and is greater in the left upper extremity than the right has some mild weakness in her hands with gripping being greater on the left than the right.  Left elbow is tender.  Complains of numbness from left hand to her elbow.      Assessment & Plan:  I think she may have carpal tunnel syndrome on the left  She has no evidence of herniated disc in the cervical neck area.  She is improving.  Musculoskeletal pain in her shoulders I think.  Continue with PT for her back and shoulders.  Rheumatology studies are  basically normal.  She has a weakly positive ANA which is not unusual in females  We can evaluate for left carpal tunnel syndrome if symptom does not improve.  She can try wearing a wrist splint.  She is returning on April 29 for health maintenance exam.

## 2020-05-19 NOTE — Progress Notes (Signed)
Subjective:  Patient Name: Alexa [name] Date of Birth: February 04, 1960  MRN: [id number]  Mystic Labo  presents to the office today for follow-up of her type 2 diabetes mellitus, obesity, combined hyperlipidemia, GERD, hypertension, ASHD, dyspepsia, pedal edema, non-proliferative diabetic retinopathy, goiter, depression, autonomic neuropathy, tachycardia, peripheral neuropathy, acquired hypothyroidism, vitamin D deficiency, hypocalcemia, secondary hyperparathyroidism, glossitis, pallor, fatigue, iron deficiency anemia, anhedonia, disinclination to take medicines, and status post Roux-en-Y gastric bypass.   HISTORY OF PRESENT ILLNESS:   Alexa [name] is a 60 y.o. Caucasian woman. Alexa [name] was unaccompanied.  1. The patient first presented to me on 08/20/04 in referral from her primary care internist, Dr. Emeline General, for evaluation and management of her type 2 diabetes, obesity, and multiple medical issues. She was 60 years old.   [name] had a long history of obesity. She was heavy as a child. She underwent menarche at age 62. At age 60 she was 180 pounds. In 1996 she weighed 218 pounds. In 1999 she was diagnosed with type 2 diabetes mellitus. Her weight at that point was 250 pounds. She was treated with Glucophage, and Actos. Actos made her gain even more weight. She had also been treated with glipizide in the past. More recently she had been treated with Lantus and Glucophage plus regular insulin as needed, especially for when she took steroids for asthma attacks. Maximum weight had been 296 pounds one month prior to that first visit with me. Her tendency to gain weight and her difficulty in losing weight were aggravated by long-standing, intermittent depression and by severe, recurrent asthma requiring the use of steroid medications.  B. Past medical history was also positive for a 60% blockage in one of her coronary arteries. She had significant issues with GERD and dyspepsia. She also had combined  hyperlipidemia. She had a previous cardiac catheterization and previous tonsillectomy. She was allergic to theophylline. Her pertinent review of systems was positive for some numbness and tingling in her feet. Family history was positive for type 2 diabetes in her father and her maternal grandmother. Her brother weighed 250 pounds at a height of 76 inches. Both her mother and her sister were hypothyroid.  C. On physical examination, her weight was 279.9 pounds. Her BMI was 49.6. Her blood pressure was 142/86. Her heart rate was 96. Her hemoglobin A1c was 9.8%. She was alert and oriented x3. Her affect was normal. Her insight was fair. She was obviously quite obese. She had a 25 gm thyroid gland. She had 1+ tremor of her hands. She had 1+ DP pulses and 2+ tinea pedis. Sensation to touch was intact in her feet. Laboratory data included a normal CMP. Her cholesterol was 175, triglycerides 76, HDL 53, and LDL 107. Her TSH was 2.98. Since she obviously did have type 2 diabetes mellitus associated with morbid obesity, and since weight loss was a major factor for her, I asked her to resume her metformin twice daily. I also started her on Byetta, initially 5 mcg twice daily and later 10 mcg twice daily. I discontinued her Lantus insulin.  2. During the last 16 years,we have had some successes, some failures, and some new problem areas.  A. T2DM: In October 2007 the combination of Byetta and metformin was causing more gastrointestinal problems. She opted to stop the metformin and continue the Byetta because it was helping her with weight control and blood sugar control. By 08/11/06 her weight had decreased to 267.6 pounds. Hemoglobin A1c was 8.6%. At that point  she decided to have bariatric surgery. She had a Roux-en-Y gastric bypass on 12/20/2006. She subsequently lost weight down to 173.4 pounds on 05/23/08, but then subsequently regained weight to the 190s. Her  hemoglobin A1c values varied in parallel with her weights.  On 05/23/08, at the point of her lowest weight, her hemoglobin A1c dropped to a nadir of 6.3%.  Since then her hemoglobin A1c values have varied between 6.6 and 13.4%. Although we were initially able to stop all of her diabetes medicines after bariatric surgery, when she began to regain weight we restarted metformin, 500 mg twice daily. Although she took her metformin twice daily for a long time, she had discontinued it many years ago. She hated to take medicines and could not make herself be compliant in taking medications or exercising. When her HbA1c increased to 9.4% in September 2015, I stopped her Byetta injections and started her on liraglutide (Victoza) daily injections on 11/16/13. After several more years of noncompliance, she has been doing much better in the past 2 years.   B. Vitamin D deficiency, hypocalcemia, and secondary hyperparathyroidism: Just before her gastric bypass, I obtained baseline bone mineral metabolism studies. Her 25-hydroxy vitamin D was very low at 7 (normal greater than or equal to 30). Her calcium was 9.5 (normal 8.6-10.6). Her parathyroid hormone was 38.4 (normal 14-72). Subsequent to her surgery, the patient was supposed to be taking multivitamins with calcium and vitamin D, but did not always do so. On 03/29/2007 her 25-hydroxy vitamin D was 29, but her calcium decreased to 8.7. Her PTH was slightly elevated at 73.5. Her 1,25-hydroxy vitamin D was 46. Her iron was 56. I asked her to make sure she took her multivitamins and calcium daily. Unfortunately, she has often been non-compliant with her multivitamins and calcium. Her 25-hydroxy vitamin D values have varied between 15-35. Her calcium values have varied between 8.1-9.4. Her PTH values have remained elevated between 79-167. Imagine's iron levels have also been low, between 32-34. She was supposed to be taking iron every day, but frequently did not do so. In the past year, however, she has been doing better at taking her  vitamin D, calcium, and iron. In May 2019 her vitamin D was at the lower end of the reference range, her calcium was about the 40% of the reference range, but her PTH was still significantly elevated. In December 2020 her PTH and calcium were mid-normal.   C. Alexa [name] has had psychological issues for many years that have adversely affected her eating, her unwillingness to exercise, her noncompliance with taking medications, her weight, and her T2DM care. Fortunately, her recent outpatient psychological therapy with Ms. Rea College, RN, MS and her taking Lexapro regularly have resulted in Hospers having a much more positive attitude about life in general and a generally more positive attitude about taking her medications. Fortunately, [facility] has been dealing successfully with her issues and her compliance has markedly improved.   D. Obesity: Although Kariya regained a great deal of weight in the years following her bariatric surgery, she has been taking Contrave more recently and has been able to lose all of the weight that she had gained after her bariatric surgery.   3. Alexa [name]'s last [facility] visit was on 12/11/19. At that visit I asked her to continue her morning dose of metformin of 1000 mg and continue the evening dose of 1000 mg. I continued her 25 mcg Synthroid dose at bedtime. I asked her to take one Biotech, 50,000 IU  capsule of vitamin D weekly and calcium, 600 mg, at each meal.   A. She had been healthy until 04/21/20 when she was switched from [place] (liraglutide) to Wegovy (semaglutide) 2.6 mg/week. After the second dose on 04/28/20 she was sick for three days with severe nausea, vomiting, and diarrhea and had to stay out of work for three days. She tolerated the third dose on 05/06/20 better, having had nausea and diarrhea for one day. She took her fourth dose last night and did not have any problems  B. She has not been taking calcium for many weeks and has not been taking all of her medications  consistently. She stopped drinking cokes. Her father has had multiple kidney stones, sepsis, a stoke, and a possible lung cancer. He has also had several ministrokes and has had personality changes. She is spending more time with him and her mother.   B. Dr. Renold Genta referred her to nephrology, Dr. Joylene Grapes. Her serum creatinine decreased to 0.92. She was diagnosed with stage 3A renal disease. Her renal US in August 2021 was "unremarkable".   C. Her husband has heart failure. He is doing much better with his bipap machine. He takes his medicine.   D. She continues to have intermittent shakiness of her hands that has been diagnosed as familial tremor.   E. She has been sleeping better with Xanax at bedtime. She does drink coffee on some mornings and tea at times.   F. She has lost 6 pounds in weight since her last visit. She is followed at [facility].    Alexa [name] remains in counseling with Ms. Showfety every month, most recently this morning.   H. She has not had much of a problem with allergies or asthma this year.    I. She still has some hot flashes, but not very often.   J. She has not been bothered by pains in her anterior shins, but does have occasional calf cramps.    K. 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 She has also done some walking for exercise..    L. She is taking Lexapro, 20 mg/day; Synthroid, 25 mcg/day; Micardis, 20 mg, twice daily; Saxenda, 3 mg/day; Protonix, 40 mg/day; metformin 1000 mg on the morning and 1000 mg in the evening. She has not been taking any calcium. She is also taking Coreg, 3.125 mg, twice daily. She is also taking B12 daily, Biotech once weekly. She is not taking oral fungal treatment for her tinea pedis.   M. She is trying to achieve more balance. She will change her work hours to two days per week in late May.    4. Review of Systems: There are no other significant issues  Constitutional: "I feel better. My back is not hurting me.  My left shoulder still hurts." Marital relations are going better.   Eyes: Her eyes feel better. Her last eye exam occurred in February or March 2022. She has "the beginnings of diabetes eye disease", but no treatment is needed. Neck: She occasionally has posterior neck and trapezius pains. The patient has no complaints of anterior neck swelling, soreness, tenderness, pressure, or discomfort.  Heart: She has no had chest pain recently. Heart rate increases with exercise or other physical activity. She has no complaints of palpitations or irregular heat beats. She saw Dr. Irish Lack in August 2021. She was told to return if needed. Gastrointestinal: She had a colonoscopy and had a polyp removed. She no longer has nausea. She  rarely  has the sensation of stomach pains. She still occasionally has post-prandial bloating if she eats too much. She always has constipation. She occasionally has head hunger and belly hunger. She is not having acid reflux, upset stomach, stomach aches or pains, swallowing difficulties, diarrhea. Her GI doctor told her to take Miralax daily.  Hands: She still has tremors occasionally.  Legs: As above. She still occasionally has leg cramps after working. She has fewer complaints of numbness, tingling, or burning. No edema is noted. Feet: There are no complaints of numbness, tingling, burning, or pain. No edema is noted. Tinea improved with her new laser therapy.   Neuro: No new sensory or muscular problems Psych: She is doing better than she was two weeks ago.      GU: She has not been urinating a lot. She has not had much nocturia.   GYN: LMP was on 05/11/2010. She had a pap smear in 2020.  Hypoglycemia: She occasionally has low BG symptoms.   5. BG printout: She is not checking BGs.  PAST MEDICAL, FAMILY, AND SOCIAL HISTORY:  Past Medical History:  Diagnosis Date  . Anemia, iron deficiency   . Anhedonia   . Anxiety   . Asthma   . Asthma, chronic   . Back pain   . CAD  (coronary artery disease)   . Chewing difficulty   . Chronic insomnia   . Chronic kidney disease    CKD stage 3 A   . Combined hyperlipidemia   . Constipation   . Depression   . Diabetes mellitus   . Diabetes mellitus type II   . Diabetic autonomic neuropathy ([place])   . Difficulty swallowing pills   . DM type 2 with diabetic peripheral neuropathy ([place])   . Dry mouth   . Dyspepsia   . Dyspnea   . Elevated homocysteine   . Essential tremor   . Fatigue   . Fatty liver   . GERD (gastroesophageal reflux disease)   . GERD (gastroesophageal reflux disease)   . Glossitis   . Goiter   . Hemorrhoids   . HLD (hyperlipidemia)   . Hyperparathyroidism , secondary, non-renal ([place])   . Hypertension   . Hypocalcemia   . Insomnia   . Leg edema   . Leg pain   . Macular degeneration   . Nonproliferative diabetic retinopathy associated with type 2 diabetes mellitus ([place])    RESOLVED  . Obesity, Class III, BMI 40-49.9 (morbid obesity) ([place])   . Osteopenia   . Osteoporosis   . Pallor   . PONV (postoperative nausea and vomiting)   . Status post gastric surgery   . Tachycardia   . Thyroiditis, autoimmune   . Vaginal dryness, menopausal   . Vitamin D deficiency     Family History  Problem Relation Age of Onset  . Thyroid disease Mother        Hypothyroid  . Depression Mother   . Liver disease Mother   . Obesity Mother   . Diabetes Father        T2 DM  . Hypertension Father   . Hyperlipidemia Father   . Heart disease Father   . Stroke Father   . Kidney Stones Father   . Obesity Brother        250 pounds at a height of 76 inches.  . Thyroid disease Sister        Hypothyroid  . Diabetes Maternal Grandmother   . Colon cancer Other  great grandfather  . Liver disease Maternal Aunt        NASH  . Esophageal cancer Neg Hx   . Pancreatic cancer Neg Hx   . Kidney disease Neg Hx   . Colon polyps Neg Hx   . Rectal cancer Neg Hx   . Stomach cancer Neg Hx      Current  Outpatient Medications:  .  albuterol (PROVENTIL) (2.5 MG/3ML) 0.083% nebulizer solution, Take 3 mLs (2.5 mg total) by nebulization every 6 (six) hours as needed for wheezing or shortness of breath., Disp: 150 mL, Rfl: 1 .  albuterol (VENTOLIN HFA) 108 (90 Base) MCG/ACT inhaler, Inhale 2 puffs into the lungs every 6 (six) hours as needed for wheezing or shortness of breath., Disp: 6.7 g, Rfl: 11 .  ALPRAZolam (XANAX) 0.5 MG tablet, TAKE 1/2 TO 1 TABLET BY MOUTH TWICE DAILY AS NEEDED FOR ANXIETY. (Patient taking differently: Take by mouth 2 (two) times daily as needed. for anxiety), Disp: 60 tablet, Rfl: 1 .  Blood Glucose Monitoring Suppl (FREESTYLE LITE) DEVI, Use to check glucose 3x daily, Disp: 2 each, Rfl: 5 .  calcium-vitamin D (OSCAL WITH D) 500-200 MG-UNIT TABS tablet, Take by mouth., Disp: , Rfl:  .  carvedilol (COREG) 3.125 MG tablet, TAKE 1 TABLET BY MOUTH TWO TIMES DAILY WITH A MEAL, Disp: 120 tablet, Rfl: 10 .  Cholecalciferol 50 MCG (2000 UT) CAPS, Take 1 capsule by mouth daily., Disp: , Rfl:  .  conjugated estrogens (PREMARIN) vaginal cream, Place 1 Applicatorful vaginally at bedtime. Use for 21 days then off for 7 days, Disp: 42.5 g, Rfl: 11 .  Cyanocobalamin (VITAMIN B 12) 100 MCG LOZG, Take by mouth., Disp: , Rfl:  .  cyclobenzaprine (FLEXERIL) 10 MG tablet, Take 1 tablet (10 mg total) by mouth 3 (three) times daily as needed for muscle spasms., Disp: 90 tablet, Rfl: 0 .  diphenhydrAMINE (SOMINEX) 25 MG tablet, Take by mouth. , Disp: , Rfl:  .  Efinaconazole 10 % SOLN, Apply 1 drop topically daily., Disp: 4 mL, Rfl: 11 .  escitalopram (LEXAPRO) 20 MG tablet, TAKE 1 TABLET (20 MG TOTAL) BY MOUTH DAILY., Disp: 90 tablet, Rfl: 0 .  Ferrous Sulfate (SLOW FE PO), Take 1 tablet by mouth daily. , Disp: , Rfl:  .  fluconazole (DIFLUCAN) 150 MG tablet, TAKE 1 TABLET (150 MG TOTAL) BY MOUTH ONCE A WEEK., Disp: 4 tablet, Rfl: 2 .  glucose blood (FREESTYLE LITE) test strip, Use to check glucose  3x daily, Disp: 300 each, Rfl: 1 .  ibuprofen (ADVIL,MOTRIN) 200 MG tablet, Take 600 mg by mouth daily as needed for headache or moderate pain., Disp: , Rfl:  .  Insulin Pen Needle 32G X 4 MM MISC, Use with Saxenda daily, Disp: 100 each, Rfl: 0 .  levothyroxine (SYNTHROID) 25 MCG tablet, TAKE 1 TABLET BY MOUTH ONCE DAILY, Disp: 90 tablet, Rfl: 1 .  metFORMIN (GLUCOPHAGE) 1000 MG tablet, TAKE 1 TABLET BY MOUTH 2 TIMES DAILY WITH A MEAL., Disp: 180 tablet, Rfl: 0 .  Multiple Vitamin (MULTIVITAMIN) tablet, Take 1 tablet by mouth daily. Reported on 02/26/2015, Disp: 30 tablet, Rfl: 0 .  ondansetron (ZOFRAN) 4 MG tablet, Take 1 tablet (4 mg total) by mouth every 8 (eight) hours as needed for nausea or vomiting., Disp: 20 tablet, Rfl: 0 .  pantoprazole (PROTONIX) 40 MG tablet, TAKE 1 TABLET (40 MG TOTAL) BY MOUTH DAILY., Disp: 90 tablet, Rfl: 4 .  Semaglutide-Weight Management 2.4 MG/0.75ML SOAJ,  Inject 2.4 mg into the skin once a week., Disp: 3 mL, Rfl: 0 .  telmisartan (MICARDIS) 40 MG tablet, TAKE 1/2 TABLET BY MOUTH TWO TIMES DAILY, Disp: 90 tablet, Rfl: 3 .  terbinafine (LAMISIL) 250 MG tablet, TAKE 1 TABLET (250 MG TOTAL) BY MOUTH DAILY., Disp: 90 tablet, Rfl: 0 .  traMADol (ULTRAM) 50 MG tablet, Take 1 tablet (50 mg total) by mouth daily as needed for moderate pain or severe pain., Disp: 30 tablet, Rfl: 1 .  Vitamin D, Ergocalciferol, (DRISDOL) 50000 units CAPS capsule, Take 50,000 Units by mouth every Sunday., Disp: , Rfl:   Allergies as of 05/20/2020 - Review Complete 05/20/2020  Allergen Reaction Noted  . Theophyllines Palpitations 05/13/2010    1. 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  2. Activities: She has not been walking for exercise.     3. Smoking, alcohol, or drugs: None 4. Primary Care Provider: Dr. Tommie Ard Baxley 5. Therapist: Ms. Rea College, MS 6.  Bariatrician: Dr. Redgie Grayer, MD  REVIEW OF SYSTEM: There are no other significant problems involving Aahna's other body systems.   Objective:  Vital Signs: BP 122/78 (BP Location: Right Arm, Patient Position: Sitting, Cuff Size: Normal)   Pulse 78   Wt 167 lb 3.2 oz (75.8 kg)   LMP 05/11/2010   BMI 29.26 kg/m    Wt Readings from Last 3 Encounters:  05/20/20 167 lb 3.2 oz (75.8 kg)  05/20/20 169 lb 8 oz (76.9 kg)  05/16/20 167 lb (75.8 kg)    Ht Readings from Last 3 Encounters:  05/20/20 5' 3.39" (1.61 m)  05/16/20 5' 3"  (1.6 m)  05/06/20 5' 3"  (1.6 m)   Body mass index is 29.26 kg/m. Facility age limit for growth percentiles is 20 years.  Body surface area is 1.84 meters squared.   PHYSICAL EXAM:  Constitutional:  Alexa [name] looks good today. She is wearing a blouse and slacks.  She has lost 6 pounds since her last visit. Her weight is now below the post-gastric bypass nadir of 173.4 pounds that it was in 2010. She is alert and bright. Her affect and insight are normal.  Eyes: There is no arcus or proptosis.  Mouth: The oropharynx appears normal. The tongue appears normal. There is normal oral moisture. There is no obvious gingivitis. Neck: There are no bruits present. The thyroid gland appears normal in size. The thyroid gland is again within normal limits at 18-20 grams in size. The left lobe is larger than the right. The consistency of the thyroid gland is normal. There is no thyroid tenderness to palpation. Lungs: The lungs are clear. Air movement is good. Heart: The heart rhythm and rate appear normal. Heart sounds S1 and S2 are normal. I do not appreciate any pathologic heart murmurs. Abdomen: The abdomen is enlarged. Bowel sounds are normal. The abdomen is soft and non-tender. There is no obviously palpable hepatomegaly, splenomegaly, or other masses.  Arms: Muscle mass appears appropriate for age.  Hands: She has a trace tremor. Phalangeal and metacarpophalangeal  joints appear normal. Palms are normal. Legs: Muscle mass appears appropriate for age. There is no edema. Her shins are not tender to touch today.  Feet: There are no significant deformities. Dorsalis pedis pulses are 1+ on the right and 1+ on the left. She has much less visible tinea in her great  toenails. Her left great toenail is has been cut.  Neurologic: Muscle strength is normal for age and gender  in both the upper and the lower extremities. Muscle tone appears normal. Sensation to touch is normal in the legs and feet.   LAB DATA:  Labs 05/16/20: HbA1c 7.5%; TSH 2.09: CMP normal, except glucose 135 and GFR 59; CBC normal; cholesterol 210, triglycerides 84, HDL 65, LDL 145;  Folate 11.9 (ref >5.4); ESR 6 (ref 0-30); Rheumatoid factor <96; cyclic citrulline peptide antibody <16; vitamin B12 1007 (ref [phone]); Urinary microalbumin/creatinine ratio 7;  Labs 12/11/19: HbA1c 8.5%  Labs 09/08/19: HbA1c 7.2%, CBG 98  Labs 09/04/19: PTH 40, calcium 8.9, 25-OH vitamin D 42  Labs 06/02/19: HbA1c 7.7%, CBG 138  Labs 02/02/19: HbA1c 7.7%, CBG 101  Labs 01/25/19: TSH 1.87, free T4 1.3, free T3 2.6; C-peptide 1.27 (ref 0.80-3.85); CMP normal except creatinine 1.25; cholesterol 169, triglycerides 81, HDL 65, LDL 87; PTH 42 (ref 14-64), calcium 9.4 (ref 8.6-10.4); urinary microalbumin/creatinine ratio 9 (ref <30)   Labs 09/08/18: HbA1c 8.2%, CBG 123  Labs 03/24/18: HbA1c 7.7%, CBG 103  Labs 01/03/18: CBG 112 fasting  Lbs 12/02/17: sodium 134, glucose 322, creatinine 1.17  labs 11/16/17: Urine microalbumin/creatinine ratio 4.8; B12 282 (ref [phone]), folate 12.7 (ref >3.0)  Labs 11/01/17: HbA1c 6.9%, CBG 189 post-prandially; PTH 108, calcium 8.6, 25-OH vitamin D 64   Labs 09/13/17: Hepatic function panel normal; CBC normal  Labs 08/09/17: Hepatic function panel normal; CBC normal, except Hct 34.7 (35-45)  Labs 06/14/17: CBG 166  Labs 06/08/17: TSH 1.80, free T4 1.3, free T3 2.3; CMP normal except  for glucose 115 and creatinine 1.27; PTH 120, calcium 9.7, 25-OH vitamin D 34;cholesterol 188, triglycerides 110, HDL 67, LDL 100; urine microalbumin/creatinine ratio   Labs 04/12/17: HbA1c 10.0%, CBG 174  Labs 03/04/17: Sodium 131, potassium 4.0, chloride 99, CO2 20, glucose 515, calcium 8.9  Labs 01/11/17: CBG 198  Labs 11/24/16: CBG 468; urine glucose 2000+, negative ketones  Labs 11/17/16: HbA1c 13.4%; TSH 1.54; cholesterol 173, triglycerides 91, HDL 71, LDL 83; 25-OH vitamin D 21; CMP normal except for glucose 314; CBC normal except MCHC 26.5 (ref 27-33)   06/04/16: HBA1c 8.1%, CBG 115  05/27/16: TSH 2.62, free T4 1.1, free T3 2.3; CMP normal except for glucose 161, and creatinine 1.09; cholesterol 187, triglycerides 77, HDL 74, LDL 98; urinary microalbumin/creatinine ratio 6; PTH 112, calcium 9.2, 25-OH vitamin D 33  03/17/16: CBG 145  Labs 12/31/15: HbA1c 13.4%, CBG 390; CMP normal except for glucose 344 and creatinine 1.14  Labs 10/08/15: HbA1c 9.6%  Labs 07/02/15: HbA1c 8.9%; PTH 107, calcium 9.2, 25-OH vitamin D 33; CBC normal; CMP normal except for creatinine 1.07; cholesterol 174, triglycerides 91, HDL 74, LDL 82; TSH 2.43, free T4 1.2, free T3 2.3  Labs 02/19/15: PTH 99, calcium 8.7, 25-OH vitamin D 20  Labs 10/08/14: HbA1c 8.0%   Labs 08/06/14: HbA1c 7.4%  Labs 08/01/14: TSH 2.014, free T4 0.87, free T3 2.4; PTH 79 (normal 14-64), calcium 9.1; 25-OH vitamin D 35 CMP normal  Labs 05/11/14: HbA1c was 7.7%, without any low BGs; TSH 2.416, free T4 1.13, free T3 2.3  Labs 02/22/14: HbA1c 11.6%  Labs 02/13/14: Calcium 9.0, PTH 131, 25-OH vitamin D 15; CMP normal except for glucose of 165; cholesterol 177, triglycerides 108, HDL 90, LDL 65; urinary microalbumin/creatinine ratio 15.6; TSH 3.639, free T4 1.30, free T3 2.5  Labs 10/10/13: HbA1c is 9.4%, compared with 8.1% at last visit  and with 9.3% in December;   Labs 05/08/13: HbA1c 8.1%; PTH 90.2, calcium 9.5, 25-hydroxy vitamin D 46;  CMP normal except for glucose of 215; WBC 8.0, RBC 5.20, Hgb 12.5, Hct 37.8%, MCV 72.7 (normal 78-100), iron 55  Labs 02/23/13: CMP normal, except glucose 194 and alkaline phosphatase 126; microalbumin/creatinine ratio was 11.5; TSH 2.086, free T4 1.29, free T3 3.2, TPO antibody < 10; 25-hydroxy vitamin D 18; C-peptide 1.79; cholesterol 156, triglycerides 64, HDL 61, LDL 82  Labs 01/05/13: Hemoglobin A1c was 9.3%, compared with 9.3 at last visit and with 9.1% at the visit prior. All of these values were major increases from 6.6% in May 2012.    Labs 04/14/12: 25-hydroxy vitamin D 21, iron 203,  Vitamin B12 379,   Labs 11/16/11: Hgb 10.3, Hct 32.4%; CMP normal except glucose 146' cholesterol 201, triglycerides 70, HDL 66, LDL 121; vitamin D 22           Labs 06/09/10: Cholesterol was 175, triglycerides 76, HDL 62, LDL 98. TSH was 1.217. Free T4 was 1.10. Free T3 was 2.7. 25-hydroxy vitamin D was 19. PTH was 167.  IMAGING:  BMD 09/08/19: pending   Assessment and Plan:   ASSESSMENT:  1. Type 2 diabetes mellitus:   A. During the past three years her T2DM control has waxed and waned, with HbA1c values varying from 6.9-13.4%. In the last year, however, her HbA1c values have only varied from 7.7-8.2%.    B. At her August 2021 visit her HbA1c had decreased to 7.2%. Unfortunately, her HbA1c increased to 8.5% in November 2021. Today, however, the HbA1c has decreased to 7.5%.   C. At today's visit she has lost 6 pounds.    D. My goal is to reduce her HbA1c to <6.5% if we can safely do so.   2. Hypoglycemia: She has not had any recognized hypoglycemia for many months.  3. Obesity: Her weight has decreased 6 pounds since her last visit. She needs to take her medications, continue to Eat Right and exercise daily.  4-5. Autonomic neuropathy and tachycardia: The patient's autonomic neuropathy and  heart rate improved as her BGs decreased, but worsened as her BGs increased. She is doing well today, but will  worsen again if she does not improve her  BGs.     6. Hypertension: Her SBP is normal today. But her DBP is slightly elevated. She still needs to try to fit in more exercise.  7-8. Anhedonia and anxiety: She is doing well. She still needs to call for help when she begins to do poorly. She also needs to continue to find ways to have more joy in her life. 9-12. GERD/esophagitis/difficulty swallowing/gastroparesis: Her reflux, other GI symptoms, and difficulty swallowing have all improved.   13-15: Hypocalcemia, vitamin D deficiency, and secondary hyperparathyroidism:   A. Due to her bariatric surgery she has more difficulty absorbing vitamin D and calcium. When she develops hypocalcemia and vitamin D deficiency, her parathyroid glands appropriately produce more PTH, resulting in secondary hyperparathyroidism. However, if she takes her calcium and vitamin D all three parameters will normalize.   B. Her calcium was normal in October 2018, but at the lower limit of normal in February 2019. Her vitamin D level was low-normal in May 2019.   C. In May 2019 her PTH was elevated to 120 and her calcium was 9.7. She still had secondary hyperparathyroidism. In October 2019 her calcium was at the low end of the reference range and her PTH was  still elevated, but lower.  D. Her PTH level in August 2021 was mid-normal. Her calcium was at the low end of the reference range, because she was taking less calcium than she had intended. Her vitamin D level was good. She needs to increase her calcium intake to 600 mg, three times daily and take her vitamin D and calcium very consistently.   E. She stopped taking calcium again. She needs to resume taking the calcium. Two Tums per day will be adequate.  85. Noncompliance: Since last visit her compliance with eating right and taking her medications has varied, but has generally been better.  17: Muscle cramps: Her cramps occur more infrequently now.  18. Hypothyroidism: Her TFTs  in May 2019 and in December 2020 were normal. Her TSH in April 2022 was normal. Her current levothyroxine dosage is working well.  19. Sleeping difficulties: These problems may have worsened a bit. I suspect that her increased caffeine intake is causing much of these problems.  20. Osteopenia/osteoporosis: Elyanah's secondary hyperparathyroidism had normalized. She had a new BMD study in August 2021. Her osteoporosis was somewhat worse. She needs to take her calcium and vitamin d/.   PLAN:  1. Diagnostic: I reviewed her lab results from August 2021 and April 2022 and her renal US and BMD with her.   2. Therapeutic: Continue medications as currently prescribed, to include metformin doses of 1000 mg in the mornings and 1000 mg in the evenings. Continue Synthroid at bedtime. Take 600 mg of calcium at mealtimes. Take [employer], 50,000 IU per week to improve her vitamin D level. Try to fit in exercise daily.  See Ms. Showfety in follow up.    3. Patient education: We discussed all of the above at great length. She must continue to take her medications and supplements. She also needs to see me every two-three months. When she knows that she is coming to see "[facility] Marveen Reeks", her compliance sometimes improves.  4. Follow-up:  3 months   Level of Service: This visit lasted in excess of 50 minutes. More than 50% of the visit was devoted to counseling.   Tillman Sers, MD, CDE Adult and Pediatric Endocrinology 05/20/2020 1:53 PM

## 2020-05-20 ENCOUNTER — Ambulatory Visit
Admission: RE | Admit: 2020-05-20 | Discharge: 2020-05-20 | Disposition: A | Discharge status: Home / Self Care | Payer: 59 | Source: Ambulatory Visit | Attending: Internal Medicine | Admitting: Internal Medicine

## 2020-05-20 ENCOUNTER — Encounter (INDEPENDENT_AMBULATORY_CARE_PROVIDER_SITE_OTHER): Payer: Self-pay | Admitting: "Endocrinology

## 2020-05-20 ENCOUNTER — Other Ambulatory Visit: Payer: Self-pay

## 2020-05-20 ENCOUNTER — Other Ambulatory Visit: Payer: 59 | Admitting: Internal Medicine

## 2020-05-20 ENCOUNTER — Ambulatory Visit (INDEPENDENT_AMBULATORY_CARE_PROVIDER_SITE_OTHER): Payer: 59 | Admitting: "Endocrinology

## 2020-05-20 ENCOUNTER — Ambulatory Visit: Payer: 59 | Admitting: Gastroenterology

## 2020-05-20 ENCOUNTER — Encounter: Payer: Self-pay | Admitting: Gastroenterology

## 2020-05-20 VITALS — BP 122/78 | HR 78 | Wt 167.2 lb

## 2020-05-20 VITALS — BP 110/70 | HR 92 | Ht 63.39 in | Wt 169.5 lb

## 2020-05-20 DIAGNOSIS — I10 Essential (primary) hypertension: Secondary | ICD-10-CM | POA: Diagnosis not present

## 2020-05-20 DIAGNOSIS — R768 Other specified abnormal immunological findings in serum: Secondary | ICD-10-CM

## 2020-05-20 DIAGNOSIS — K5909 Other constipation: Secondary | ICD-10-CM

## 2020-05-20 DIAGNOSIS — K219 Gastro-esophageal reflux disease without esophagitis: Secondary | ICD-10-CM

## 2020-05-20 DIAGNOSIS — G479 Sleep disorder, unspecified: Secondary | ICD-10-CM

## 2020-05-20 DIAGNOSIS — N1831 Chronic kidney disease, stage 3a: Secondary | ICD-10-CM | POA: Diagnosis not present

## 2020-05-20 DIAGNOSIS — E1122 Type 2 diabetes mellitus with diabetic chronic kidney disease: Secondary | ICD-10-CM | POA: Diagnosis not present

## 2020-05-20 DIAGNOSIS — I129 Hypertensive chronic kidney disease with stage 1 through stage 4 chronic kidney disease, or unspecified chronic kidney disease: Secondary | ICD-10-CM | POA: Diagnosis not present

## 2020-05-20 DIAGNOSIS — E063 Autoimmune thyroiditis: Secondary | ICD-10-CM

## 2020-05-20 DIAGNOSIS — Z1231 Encounter for screening mammogram for malignant neoplasm of breast: Secondary | ICD-10-CM

## 2020-05-20 DIAGNOSIS — Z9884 Bariatric surgery status: Secondary | ICD-10-CM | POA: Diagnosis not present

## 2020-05-20 DIAGNOSIS — E1140 Type 2 diabetes mellitus with diabetic neuropathy, unspecified: Secondary | ICD-10-CM | POA: Diagnosis not present

## 2020-05-20 DIAGNOSIS — E559 Vitamin D deficiency, unspecified: Secondary | ICD-10-CM

## 2020-05-20 DIAGNOSIS — E11649 Type 2 diabetes mellitus with hypoglycemia without coma: Secondary | ICD-10-CM | POA: Diagnosis not present

## 2020-05-20 LAB — POCT GLUCOSE (DEVICE FOR HOME USE): POC Glucose: 115 mg/dl — AB (ref 70–99)

## 2020-05-20 NOTE — Patient Instructions (Addendum)
If you are age 59 or older, your body mass index should be between 23-30. Your Body mass index is 29.66 kg/m. If this is out of the aforementioned range listed, please consider follow up with your Primary Care Provider.  If you are age 68 or younger, your body mass index should be between 19-25. Your Body mass index is 29.66 kg/m. If this is out of the aformentioned range listed, please consider follow up with your Primary Care Provider.   You will be due for a recall colonoscopy in 09-2020. We will send you a reminder in the mail when it gets closer to that time.   Please start Miralax every day.  Please schedule an appointment to see your OBGYN.  Thank you for trusting me with your gastrointestinal care!    Wilfrid Lund, M.D

## 2020-05-20 NOTE — Patient Instructions (Addendum)
Follow up visit in 3 months. Please take your medicines.

## 2020-05-20 NOTE — Progress Notes (Signed)
[facility] Progress Note  Chief Complaint: Chronic constipation  Subjective  History: Alexa [name] was seen for chronic constipation today.  Alexa [name] had a colonoscopy in 2014 with no polyps but a reportedly fair preparation.  I saw her once for a colonoscopy in September 2021, where a diminutive adenoma polyp was removed, but also the preparation was only fair despite a 2-day preparation and extensive lavage.  Alexa [name] was noted to have a redundant colon causing some challenging scope passage made worse by the bowel prep.  Alexa [name] has struggled with constipation her entire life.  Alexa [name] recalls during childhood having great difficulty with it, sitting for prolonged periods and straining and sometimes producing "very large" bowel movements.  Some days Alexa [name] will have 1 or 2 small stools but Alexa [name] will often still need to strain and feels incompletely evacuated.  Sometimes Alexa [name] will go several days without a BM and rarely feels the urge for during that time.  Alexa [name] has not had any children or pelvic surgery.  Sometimes Alexa [name] will put pressure on the perineum because Alexa [name] feels that it is bulging while Alexa [name] is straining, and this seems to help aid the passage of bowel movements.  Alexa [name] has not manually disimpacted herself, and often raises her feet on a small stool to aid with BMs.  Alexa [name] has not seen a gynecologist in years since her primary care provider does her Pap smears. Alexa [name] denies rectal bleeding, her appetite is good and weight stable. Alexa [name] is concerned that Alexa [name] has been unable to get a sufficient bowel preparation, and was wondering what we would do for the upcoming procedure in September.  Outlined for a proposed extensive bowel preparation was noted in that procedure report, and I reviewed that with her.  ROS: Cardiovascular:  no chest pain Respiratory: no dyspnea Anxiety Arthralgias Remainder of systems negative except as above The patient's Past Medical, Family and Social History were reviewed and are on  file in the EMR.  Objective:  Med list reviewed  Current Outpatient Medications:  .  albuterol (PROVENTIL) (2.5 MG/3ML) 0.083% nebulizer solution, Take 3 mLs (2.5 mg total) by nebulization every 6 (six) hours as needed for wheezing or shortness of breath., Disp: 150 mL, Rfl: 1 .  albuterol (VENTOLIN HFA) 108 (90 Base) MCG/ACT inhaler, Inhale 2 puffs into the lungs every 6 (six) hours as needed for wheezing or shortness of breath., Disp: 6.7 g, Rfl: 11 .  ALPRAZolam (XANAX) 0.5 MG tablet, TAKE 1/2 TO 1 TABLET BY MOUTH TWICE DAILY AS NEEDED FOR ANXIETY. (Patient taking differently: Take by mouth 2 (two) times daily as needed. for anxiety), Disp: 60 tablet, Rfl: 1 .  Blood Glucose Monitoring Suppl (FREESTYLE LITE) DEVI, Use to check glucose 3x daily, Disp: 2 each, Rfl: 5 .  calcium-vitamin D (OSCAL WITH D) 500-200 MG-UNIT TABS tablet, Take by mouth., Disp: , Rfl:  .  carvedilol (COREG) 3.125 MG tablet, TAKE 1 TABLET BY MOUTH TWO TIMES DAILY WITH A MEAL, Disp: 120 tablet, Rfl: 10 .  Cholecalciferol 50 MCG (2000 UT) CAPS, Take 1 capsule by mouth daily., Disp: , Rfl:  .  conjugated estrogens (PREMARIN) vaginal cream, Place 1 Applicatorful vaginally at bedtime. Use for 21 days then off for 7 days, Disp: 42.5 g, Rfl: 11 .  Cyanocobalamin (VITAMIN B 12) 100 MCG LOZG, Take by mouth., Disp: , Rfl:  .  cyclobenzaprine (FLEXERIL) 10 MG tablet, Take 1 tablet (10 mg total) by mouth 3 (three) times daily as needed for muscle  spasms., Disp: 90 tablet, Rfl: 0 .  diphenhydrAMINE (SOMINEX) 25 MG tablet, Take by mouth. , Disp: , Rfl:  .  Efinaconazole 10 % SOLN, Apply 1 drop topically daily., Disp: 4 mL, Rfl: 11 .  escitalopram (LEXAPRO) 20 MG tablet, TAKE 1 TABLET (20 MG TOTAL) BY MOUTH DAILY., Disp: 90 tablet, Rfl: 0 .  Ferrous Sulfate (SLOW FE PO), Take 1 tablet by mouth daily. , Disp: , Rfl:  .  fluconazole (DIFLUCAN) 150 MG tablet, TAKE 1 TABLET (150 MG TOTAL) BY MOUTH ONCE A WEEK., Disp: 4 tablet, Rfl: 2 .   glucose blood (FREESTYLE LITE) test strip, Use to check glucose 3x daily, Disp: 300 each, Rfl: 1 .  ibuprofen (ADVIL,MOTRIN) 200 MG tablet, Take 600 mg by mouth daily as needed for headache or moderate pain., Disp: , Rfl:  .  Insulin Pen Needle 32G X 4 MM MISC, Use with Saxenda daily, Disp: 100 each, Rfl: 0 .  levothyroxine (SYNTHROID) 25 MCG tablet, TAKE 1 TABLET BY MOUTH ONCE DAILY, Disp: 90 tablet, Rfl: 1 .  metFORMIN (GLUCOPHAGE) 1000 MG tablet, TAKE 1 TABLET BY MOUTH 2 TIMES DAILY WITH A MEAL., Disp: 180 tablet, Rfl: 0 .  Multiple Vitamin (MULTIVITAMIN) tablet, Take 1 tablet by mouth daily. Reported on 02/26/2015, Disp: 30 tablet, Rfl: 0 .  ondansetron (ZOFRAN) 4 MG tablet, Take 1 tablet (4 mg total) by mouth every 8 (eight) hours as needed for nausea or vomiting., Disp: 20 tablet, Rfl: 0 .  pantoprazole (PROTONIX) 40 MG tablet, TAKE 1 TABLET (40 MG TOTAL) BY MOUTH DAILY., Disp: 90 tablet, Rfl: 4 .  Semaglutide-Weight Management 2.4 MG/0.75ML SOAJ, Inject 2.4 mg into the skin once a week., Disp: 3 mL, Rfl: 0 .  telmisartan (MICARDIS) 40 MG tablet, TAKE 1/2 TABLET BY MOUTH TWO TIMES DAILY, Disp: 90 tablet, Rfl: 3 .  terbinafine (LAMISIL) 250 MG tablet, TAKE 1 TABLET (250 MG TOTAL) BY MOUTH DAILY., Disp: 90 tablet, Rfl: 0 .  traMADol (ULTRAM) 50 MG tablet, Take 1 tablet (50 mg total) by mouth daily as needed for moderate pain or severe pain., Disp: 30 tablet, Rfl: 1 .  Vitamin D, Ergocalciferol, (DRISDOL) 50000 units CAPS capsule, Take 50,000 Units by mouth every Sunday., Disp: , Rfl:    Vital signs in last 24 hrs: Vitals:   05/20/20 0907  BP: 110/70  Pulse: 92   Wt Readings from Last 3 Encounters:  05/20/20 169 lb 8 oz (76.9 kg)  05/16/20 167 lb (75.8 kg)  05/06/20 173 lb (78.5 kg)    Physical Exam  Well-appearing  HEENT: sclera anicteric, oral mucosa moist without lesions  Neck: supple, no thyromegaly, JVD or lymphadenopathy  Cardiac: RRR without murmurs, S1S2 heard, no  peripheral edema  Pulm: clear to auscultation bilaterally, normal RR and effort noted  Abdomen: soft, no tenderness, with active bowel sounds. No guarding or palpable hepatosplenomegaly.  Skin; warm and dry, no jaundice or rash Rectal: Normal perianal exam.  No rectal prolapse or hemorrhoidal prolapse with bearing down.  Mildly decreased resting and voluntary sphincter tone, normal puborectalis contraction.  Marked rectal descent with bearing down (?  Rectocele).  Labs:  CBC Latest Ref Rng & Units 05/16/2020 03/27/2019 12/02/2017  WBC 3.8 - 10.8 Thousand/uL 5.2 5.5 8.1  Hemoglobin 11.7 - 15.5 g/dL 14.2 12.1 11.8  Hematocrit 35.0 - 45.0 % 42.7 36.8 35.3  Platelets 140 - 400 Thousand/uL 352 316 407(H)   CMP Latest Ref Rng & Units 05/16/2020 12/11/2019 12/11/2019  Glucose 65 -  99 mg/dL 135(H) - 105(H)  BUN 7 - 25 mg/dL 15 - 14  Creatinine 0.50 - 1.05 mg/dL 1.03 - 0.85  Sodium 135 - 146 mmol/L 138 - 138  Potassium 3.5 - 5.3 mmol/L 5.0 - 4.6  Chloride 98 - 110 mmol/L 104 - 104  CO2 20 - 32 mmol/L 23 - 25  Calcium 8.6 - 10.4 mg/dL 10.0 9.1 9.1  Total Protein 6.1 - 8.1 g/dL 6.8 - 6.0(L)  Total Bilirubin 0.2 - 1.2 mg/dL 0.5 - 0.3  Alkaline Phos 33 - 130 U/L - - -  AST 10 - 35 U/L 12 - 13  ALT 6 - 29 U/L 11 - 10   Recent hemoglobin A1c 7.5  Lab Results  Component Value Date   TSH 2.09 05/16/2020     ___________________________________________ Radiologic studies:   ____________________________________________ Other:   _____________________________________________ Assessment & Plan  Assessment: Encounter Diagnosis  Name Primary?  . Chronic constipation Yes   Lifelong constipation, probable generalized slow colonic motility, though given age and exam findings, perhaps there could be concomitant pelvic floor dysfunction and even rectocele.  I recommended Alexa [name] have a complete examination by gynecologist.  Alexa [name] will start with MiraLAX half a capful a day in hopes of relieving  constipation somewhat.  Alexa [name] says it has been her pattern her entire life and Alexa [name] has come to live with it, and Alexa [name] would prefer not to take prescription medicines if possible.  Alexa [name] will need a more extensive preparation as outlined for her next colonoscopy in hopes of getting a better quality preparation, and therefore a less frequent need for the procedure.  30 minutes were spent on this encounter (including chart review, history/exam, counseling/coordination of care, and documentation) > 50% of that time was spent on counseling and coordination of care.  Topics discussed included: Chronic constipation.  Nelida Meuse III

## 2020-05-21 LAB — TSH: TSH: 2.09 mIU/L (ref 0.40–4.50)

## 2020-05-21 LAB — TEST AUTHORIZATION

## 2020-05-21 LAB — COMPLETE METABOLIC PANEL WITH GFR
AG Ratio: 2.1 (calc) (ref 1.0–2.5)
ALT: 11 U/L (ref 6–29)
AST: 12 U/L (ref 10–35)
Albumin: 4.6 g/dL (ref 3.6–5.1)
Alkaline phosphatase (APISO): 67 U/L (ref 37–153)
BUN: 15 mg/dL (ref 7–25)
CO2: 23 mmol/L (ref 20–32)
Calcium: 10 mg/dL (ref 8.6–10.4)
Chloride: 104 mmol/L (ref 98–110)
Creat: 1.03 mg/dL (ref 0.50–1.05)
GFR, Est African American: 69 mL/min/{1.73_m2} (ref >=60)
GFR, Est Non African American: 59 mL/min/{1.73_m2} — ABNORMAL LOW (ref >=60)
Globulin: 2.2 g/dL (calc) (ref 1.9–3.7)
Glucose, Bld: 135 mg/dL — ABNORMAL HIGH (ref 65–99)
Potassium: 5 mmol/L (ref 3.5–5.3)
Sodium: 138 mmol/L (ref 135–146)
Total Bilirubin: 0.5 mg/dL (ref 0.2–1.2)
Total Protein: 6.8 g/dL (ref 6.1–8.1)

## 2020-05-21 LAB — CBC WITH DIFFERENTIAL/PLATELET
Absolute Monocytes: 343 cells/uL (ref 200–950)
Basophils Absolute: 31 cells/uL (ref 0–200)
Basophils Relative: 0.6 %
Eosinophils Absolute: 62 cells/uL (ref 15–500)
Eosinophils Relative: 1.2 %
HCT: 42.7 % (ref 35.0–45.0)
Hemoglobin: 14.2 g/dL (ref 11.7–15.5)
Lymphs Abs: 2382 cells/uL (ref 850–3900)
MCH: 28.1 pg (ref 27.0–33.0)
MCHC: 33.3 g/dL (ref 32.0–36.0)
MCV: 84.4 fL (ref 80.0–100.0)
MPV: 9.4 fL (ref 7.5–12.5)
Monocytes Relative: 6.6 %
Neutro Abs: 2382 cells/uL (ref 1500–7800)
Neutrophils Relative %: 45.8 %
Platelets: 352 10*3/uL (ref 140–400)
RBC: 5.06 10*6/uL (ref 3.80–5.10)
RDW: 14.7 % (ref 11.0–15.0)
Total Lymphocyte: 45.8 %
WBC: 5.2 10*3/uL (ref 3.8–10.8)

## 2020-05-21 LAB — LIPID PANEL
Cholesterol: 210 mg/dL — ABNORMAL HIGH (ref <200)
HDL: 65 mg/dL (ref >=50)
LDL Cholesterol (Calc): 127 mg/dL (calc) — ABNORMAL HIGH
Non-HDL Cholesterol (Calc): 145 mg/dL (calc) — ABNORMAL HIGH (ref <130)
Total CHOL/HDL Ratio: 3.2 (calc) (ref <5.0)
Triglycerides: 84 mg/dL (ref <150)

## 2020-05-21 LAB — HEMOGLOBIN A1C
Hgb A1c MFr Bld: 7.5 % of total Hgb — ABNORMAL HIGH (ref <5.7)
Mean Plasma Glucose: 169 mg/dL
eAG (mmol/L): 9.3 mmol/L

## 2020-05-21 LAB — ANTI-NUCLEAR AB-TITER (ANA TITER): ANA Titer 1: 1:40 {titer} — ABNORMAL HIGH

## 2020-05-21 LAB — ANA: Anti Nuclear Antibody (ANA): POSITIVE — AB

## 2020-05-21 LAB — ANTI-SMITH ANTIBODY: ENA SM Ab Ser-aCnc: <1 AI

## 2020-05-21 LAB — RHEUMATOID FACTOR: Rheumatoid fact SerPl-aCnc: <14 IU/mL (ref <14)

## 2020-05-21 LAB — ANTI-DNA ANTIBODY, DOUBLE-STRANDED: ds DNA Ab: <1 IU/mL

## 2020-05-21 LAB — FOLATE: Folate: 11.9 ng/mL

## 2020-05-21 LAB — VITAMIN B12: Vitamin B-12: 1007 pg/mL (ref 200–1100)

## 2020-05-21 LAB — SEDIMENTATION RATE: Sed Rate: 6 mm/h (ref 0–30)

## 2020-05-21 LAB — MICROALBUMIN / CREATININE URINE RATIO
Creatinine, Urine: 251 mg/dL (ref 20–275)
Microalb Creat Ratio: 7 mcg/mg creat (ref <30)
Microalb, Ur: 1.8 mg/dL

## 2020-05-21 LAB — CYCLIC CITRUL PEPTIDE ANTIBODY, IGG: Cyclic Citrullin Peptide Ab: <16 UNITS

## 2020-05-24 ENCOUNTER — Other Ambulatory Visit: Payer: Self-pay

## 2020-05-24 ENCOUNTER — Other Ambulatory Visit (HOSPITAL_COMMUNITY)
Admission: RE | Admit: 2020-05-24 | Discharge: 2020-05-24 | Disposition: A | Discharge status: Home / Self Care | Payer: 59 | Source: Ambulatory Visit | Attending: Internal Medicine | Admitting: Internal Medicine

## 2020-05-24 ENCOUNTER — Other Ambulatory Visit (HOSPITAL_COMMUNITY): Payer: Self-pay

## 2020-05-24 ENCOUNTER — Ambulatory Visit (INDEPENDENT_AMBULATORY_CARE_PROVIDER_SITE_OTHER): Payer: 59 | Admitting: Internal Medicine

## 2020-05-24 ENCOUNTER — Encounter: Payer: Self-pay | Admitting: Internal Medicine

## 2020-05-24 VITALS — BP 140/90 | HR 99 | Ht 63.0 in | Wt 164.0 lb

## 2020-05-24 DIAGNOSIS — Z124 Encounter for screening for malignant neoplasm of cervix: Secondary | ICD-10-CM

## 2020-05-24 DIAGNOSIS — I10 Essential (primary) hypertension: Secondary | ICD-10-CM

## 2020-05-24 DIAGNOSIS — M19012 Primary osteoarthritis, left shoulder: Secondary | ICD-10-CM

## 2020-05-24 DIAGNOSIS — M5416 Radiculopathy, lumbar region: Secondary | ICD-10-CM

## 2020-05-24 DIAGNOSIS — R768 Other specified abnormal immunological findings in serum: Secondary | ICD-10-CM | POA: Diagnosis not present

## 2020-05-24 DIAGNOSIS — N951 Menopausal and female climacteric states: Secondary | ICD-10-CM | POA: Diagnosis not present

## 2020-05-24 DIAGNOSIS — E1169 Type 2 diabetes mellitus with other specified complication: Secondary | ICD-10-CM | POA: Diagnosis not present

## 2020-05-24 DIAGNOSIS — Z8709 Personal history of other diseases of the respiratory system: Secondary | ICD-10-CM | POA: Diagnosis not present

## 2020-05-24 DIAGNOSIS — Z9884 Bariatric surgery status: Secondary | ICD-10-CM

## 2020-05-24 DIAGNOSIS — Z0000 Encounter for general adult medical examination without abnormal findings: Secondary | ICD-10-CM

## 2020-05-24 DIAGNOSIS — Z1231 Encounter for screening mammogram for malignant neoplasm of breast: Secondary | ICD-10-CM

## 2020-05-24 DIAGNOSIS — F419 Anxiety disorder, unspecified: Secondary | ICD-10-CM

## 2020-05-24 DIAGNOSIS — F32A Depression, unspecified: Secondary | ICD-10-CM

## 2020-05-24 LAB — POCT URINALYSIS DIPSTICK
Appearance: NEGATIVE
Bilirubin, UA: NEGATIVE
Blood, UA: NEGATIVE
Glucose, UA: NEGATIVE
Ketones, UA: NEGATIVE
Leukocytes, UA: NEGATIVE
Nitrite, UA: NEGATIVE
Odor: NEGATIVE
Protein, UA: NEGATIVE
Spec Grav, UA: 1.02 (ref 1.010–1.025)
Urobilinogen, UA: 0.2 E.U./dL
pH, UA: 6 (ref 5.0–8.0)

## 2020-05-24 MED ORDER — ESTROGENS, CONJUGATED 0.625 MG/GM VA CREA
1.0000 | TOPICAL_CREAM | Freq: Every day | VAGINAL | 11 refills | Status: AC
  Filled 2020-05-24 – 2020-05-29 (×2): qty 30, 15d supply, fill #0

## 2020-05-24 MED ORDER — HYDROCODONE-ACETAMINOPHEN 10-325 MG PO TABS
1.0000 | ORAL_TABLET | Freq: Three times a day (TID) | ORAL | 0 refills | Status: AC | PRN
  Filled 2020-05-24: qty 15, 5d supply, fill #0

## 2020-05-24 MED ORDER — ESCITALOPRAM OXALATE 20 MG PO TABS
20.0000 mg | ORAL_TABLET | Freq: Every day | ORAL | 0 refills | Status: DC
  Filled 2020-05-24: qty 90, 90d supply, fill #0

## 2020-05-24 MED ORDER — ALPRAZOLAM 0.5 MG PO TABS
0.2500 mg | ORAL_TABLET | Freq: Two times a day (BID) | ORAL | 1 refills | Status: DC | PRN
  Filled 2020-05-24: qty 60, 30d supply, fill #0
  Filled 2020-06-24: qty 60, 30d supply, fill #1

## 2020-05-24 NOTE — Progress Notes (Addendum)
Subjective:    Patient ID: Alexa [name], female    DOB: 10-08-1960, 60 y.o.   MRN: [id number]  HPI 60 year old Female with recurrent low back pain has recently been out of work due to severe back pain. She is here for health maintenance exam  She works as a Writer.  We recently did a number of rheumatology studies in the only positive finding was a positive ANA with a titer 1: 40 which is common in females and may not represent lupus but we did add anti-Smith and anti-DNA antibodies.  These are pending.  FMLA paperwork has been completed for her recently.  She has had some arm pain and numbness.  Has tingling in feet and hands.  B12 level was drawn last year.  Has numbness in left arm.  Neck and shoulders hurt.  Pain in neck extends down left and right upper extremities but worse on the left.  Has noticed some weakness in her hands left greater than right.  Seen April 11 with recurrence of chronic back pain for about a week.  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  Has taken tramadol sparingly for pain in the past.  History of type 2 diabetes followed by Dr. Erlene Quan.  Attends [facility] for weight loss management.  History of gastric bypass surgery in 2008.  Takes B12 due to history of B12 deficiency with that surgery.  History of left sciatica in January 2020.  Had right sciatica in 2017.  Also had right-sided sciatica in 2015 and left lumbar radiculopathy in December 2019.  Generally prefers not to go to physical therapy but usually responds to medication and rest.  She had colonoscopy in 2021 by Dr. Loletha Carrow. The prep was only fair and he was suggesting another study this year.  Had eye exam with Dr. Raliegh Scarlet in November 2021.  Review of Systems see above back pain is improved but not 100% but she feels like she can and needs to work.  Immunizations reviewed and are up-to-date.     Objective:   Physical Exam Vital signs reviewed.   Decreased range of motion in left shoulder.  Straight leg raising is negative at 90 degrees bilaterally.  Muscle strength is normal in the upper and lower extremities.  TMs clear.  Neck supple.  No thyromegaly.  No carotid bruits.  Chest clear to auscultation.  Cardiac exam: Regular rate and rhythm.  Abdomen soft nondistended without hepatosplenomegaly masses or tenderness.  She has vaginal dryness.  Pap smear obtained.  No masses appreciated on bimanual exam. Decreased ROM right UE.        Assessment & Plan:    Low back pain- will continue to work but watch heavy lifting.  Has Flexeril if needed.  Have also refilled Norco 10/325 only for 5 days.  May break it in half to make it last longer. Declines PT. Has Flexeril. MRI of LS spine showed spondylosis L4-L5 minimal disc bulge at L1-2 and L3-L4 declined PT  DM followed by Dr. Tobe Sos and stable  Anxiety and depression- treated with Lexapro and Xanax  GERD treated with Protonix and stable  HTN-stable on treatment per Dr. Tobe Sos  Vaginal dryness- lomgstanding  History of asthma treated with albuterol and stable  Hypothyroidism treated with low-dose levothyroxine by Dr. Tobe Sos  Chronic neck pain and shoulder pain-have ordered MRI of the C-spine without contrast.  Decreased range of motion of the left upper extremity with pain.shoulders  and neck hurt.  Positive ANA- have ordered anti-Smith and anti-DNA and other Rheumatology labs are negative  Hx of gastric by-pass and also goes to healthy weight clinic  Plan: Is back to work will not do heavy lifting. To have neck MRI. RTC for health maintenance exam in one year. Has followup with Dr. Tobe Sos in July.Ordered mammogram. Needs repeat colonoscopy due to poor prep last year.Immunizations are Up to date.

## 2020-05-25 NOTE — Patient Instructions (Addendum)
I have refilled Norco 10/325 for back pain to take sparingly 1/2 to 1 tablet up to 3 times daily.  Can only give 5 days of medication.  Patient is returning to work.  Is having issues with neck and shoulder pain.  Needs to have repeat colonoscopy as there was a poor prep with last colonoscopy last year with Dr. Loletha Carrow.  Continue follow-up with diabetes with Dr. Tobe Sos.  Labs drawn to rule out lupus as you have a positive ANA which is a common finding in Females. Have ordered MRI of C-spine. Have ordered mammogram. Immunizations are up todate.

## 2020-05-27 ENCOUNTER — Telehealth: Payer: Self-pay | Admitting: Internal Medicine

## 2020-05-27 DIAGNOSIS — F39 Unspecified mood [affective] disorder: Secondary | ICD-10-CM | POA: Diagnosis not present

## 2020-05-27 NOTE — Telephone Encounter (Signed)
LVM that we will do referral to [facility] after she has C-Spine MRI, shoulder may all be related to neck.  Also let her know that [facility] will be sending her a letter when it is time for her repeat Colonoscopy, that they have it noted in the chart for September.

## 2020-05-29 ENCOUNTER — Other Ambulatory Visit (HOSPITAL_COMMUNITY): Payer: Self-pay

## 2020-05-29 LAB — CYTOLOGY - PAP
Comment: NEGATIVE
Diagnosis: NEGATIVE
High risk HPV: NEGATIVE

## 2020-05-30 ENCOUNTER — Ambulatory Visit
Admission: RE | Admit: 2020-05-30 | Discharge: 2020-05-30 | Disposition: A | Discharge status: Home / Self Care | Payer: 59 | Source: Ambulatory Visit | Attending: Internal Medicine | Admitting: Internal Medicine

## 2020-05-30 ENCOUNTER — Encounter (INDEPENDENT_AMBULATORY_CARE_PROVIDER_SITE_OTHER): Payer: Self-pay | Admitting: Family Medicine

## 2020-05-30 ENCOUNTER — Other Ambulatory Visit: Payer: Self-pay

## 2020-05-30 ENCOUNTER — Other Ambulatory Visit (HOSPITAL_COMMUNITY): Payer: Self-pay

## 2020-05-30 ENCOUNTER — Ambulatory Visit (INDEPENDENT_AMBULATORY_CARE_PROVIDER_SITE_OTHER): Payer: 59 | Admitting: Family Medicine

## 2020-05-30 VITALS — BP 126/85 | HR 74 | Temp 97.6°F | Ht 63.0 in | Wt 161.0 lb

## 2020-05-30 DIAGNOSIS — M5412 Radiculopathy, cervical region: Secondary | ICD-10-CM

## 2020-05-30 DIAGNOSIS — E1122 Type 2 diabetes mellitus with diabetic chronic kidney disease: Secondary | ICD-10-CM | POA: Diagnosis not present

## 2020-05-30 DIAGNOSIS — Z6829 Body mass index (BMI) 29.0-29.9, adult: Secondary | ICD-10-CM

## 2020-05-30 DIAGNOSIS — K5909 Other constipation: Secondary | ICD-10-CM | POA: Insufficient documentation

## 2020-05-30 DIAGNOSIS — M47812 Spondylosis without myelopathy or radiculopathy, cervical region: Secondary | ICD-10-CM | POA: Diagnosis not present

## 2020-05-30 DIAGNOSIS — M50223 Other cervical disc displacement at C6-C7 level: Secondary | ICD-10-CM | POA: Diagnosis not present

## 2020-05-30 DIAGNOSIS — Z9189 Other specified personal risk factors, not elsewhere classified: Secondary | ICD-10-CM

## 2020-05-30 DIAGNOSIS — E663 Overweight: Secondary | ICD-10-CM | POA: Diagnosis not present

## 2020-05-30 DIAGNOSIS — N1831 Chronic kidney disease, stage 3a: Secondary | ICD-10-CM

## 2020-05-30 MED ORDER — SEMAGLUTIDE-WEIGHT MANAGEMENT 2.4 MG/0.75ML ~~LOC~~ SOAJ
2.4000 mg | SUBCUTANEOUS | 0 refills | Status: DC
  Filled 2020-05-30 (×2): qty 3, 28d supply, fill #0

## 2020-06-03 ENCOUNTER — Telehealth: Payer: Self-pay | Admitting: "Endocrinology

## 2020-06-03 ENCOUNTER — Encounter (INDEPENDENT_AMBULATORY_CARE_PROVIDER_SITE_OTHER): Payer: Self-pay | Admitting: Family Medicine

## 2020-06-03 ENCOUNTER — Telehealth (INDEPENDENT_AMBULATORY_CARE_PROVIDER_SITE_OTHER): Payer: Self-pay | Admitting: "Endocrinology

## 2020-06-03 DIAGNOSIS — Z6829 Body mass index (BMI) 29.0-29.9, adult: Secondary | ICD-10-CM | POA: Insufficient documentation

## 2020-06-03 DIAGNOSIS — E663 Overweight: Secondary | ICD-10-CM | POA: Insufficient documentation

## 2020-06-03 NOTE — Progress Notes (Signed)
Chief Complaint:   OBESITY Alexa [name] is here to discuss her progress with her obesity treatment plan along with follow-up of her obesity related diagnoses. Alexa [name] is on practicing portion control and making smarter food choices, such as increasing vegetables and decreasing simple carbohydrates and states she is following her eating plan approximately 75% of the time. Alexa [name] states she is not currently exercising.  Today's visit was #: 23 Starting weight: 169 lbs Starting date: 11/17/2019 Today's weight: 161 lbs Today's date: 05/30/2020 Total lbs lost to date: 8 Total lbs lost since last in-office visit: 11  Interim History: Alexa [name] is doing well. She is not drinking soda at this point. She does not want it. She is getting in 85 grams of protein per day 60-75% of the time. She sees counselor monthly and feels she is in a better frame of mind.  Shehad an MRI today for back pain.  Subjective:   1. Type 2 diabetes mellitus with stage 3a chronic kidney disease, without long-term current use of insulin (HCC) Alexa [name]'s A1c is not at goal. Her last A1c was 7.5 (down from 8.5 on 12/11/2019). She is on Wegovy 2.4 mg weekly and Metformin. Pt is compliant with Wegovy but not always taking Metformin. She reports constipation.  Lab Results  Component Value Date   HGBA1C 7.5 (H) 05/16/2020   HGBA1C 8.5 (A) 12/11/2019   HGBA1C 7.2 (A) 09/08/2019   Lab Results  Component Value Date   MICROALBUR 1.8 05/16/2020   LDLCALC 127 (H) 05/16/2020   CREATININE 1.03 05/16/2020   Lab Results  Component Value Date   INSULIN 8.1 11/16/2017    2. Chronic constipation Alexa [name] has constipation at baseline. She sees GI and was recommended daily Miralax, but pt hasn't taken it so far.  3. At risk for medication nonadherence Alexa [name] is at risk for medication non-adherence. He has a history of medication non-adherence which she says is related to deep seated psychological issues.  Assessment/Plan:   1. Type 2  diabetes mellitus with stage 3a chronic kidney disease, without long-term current use of insulin (HCC) Refill:  - Semaglutide-Weight Management 2.4 MG/0.75ML SOAJ; Inject 2.4 mg into the skin once a week.  Dispense: 3 mL; Refill: 0  2. Chronic constipation  Take Miralax daily as directed by GI.   3. At risk for medication nonadherence Alexa [name] was given approximately 15 minutes of counseling today to help her avoid medication non-adherence.  We discussed importance of taking medications at a similar time each day and the use of daily pill organizers to help improve medication adherence.  Repetitive spaced learning was employed today to elicit superior memory formation and behavioral change.  4. Overweight: Current BMI 28.53  Alexa [name] is currently in the action stage of change. As such, her goal is to continue with weight loss efforts. She has agreed to practicing portion control and making smarter food choices, such as increasing vegetables and decreasing simple carbohydrates 85 grams protein daily.   Exercise goals: Pt will start exercising after work schedule goes to part-time in June.  Behavioral modification strategies: decreasing simple carbohydrates and decreasing liquid calories.  Alexa [name] has agreed to follow-up with our clinic in 4 weeks.   Objective:   Blood pressure 126/85, pulse 74, temperature 97.6 F (36.4 C), height 5\' 3"  (1.6 m), weight 161 lb (73 kg), last menstrual period 05/11/2010, SpO2 98 %. Body mass index is 28.52 kg/m.  General: Cooperative, alert, well developed, in no acute distress. HEENT: Conjunctivae and lids unremarkable.  Cardiovascular: Regular rhythm.  Lungs: Normal work of breathing. Neurologic: No focal deficits.   Lab Results  Component Value Date   CREATININE 1.03 05/16/2020   BUN 15 05/16/2020   NA 138 05/16/2020   K 5.0 05/16/2020   CL 104 05/16/2020   CO2 23 05/16/2020   Lab Results  Component Value Date   ALT 11 05/16/2020   AST 12  05/16/2020   ALKPHOS 85 05/27/2016   BILITOT 0.5 05/16/2020   Lab Results  Component Value Date   HGBA1C 7.5 (H) 05/16/2020   HGBA1C 8.5 (A) 12/11/2019   HGBA1C 7.2 (A) 09/08/2019   HGBA1C 7.7 (A) 06/02/2019   HGBA1C 7.7 (A) 02/02/2019   Lab Results  Component Value Date   INSULIN 8.1 11/16/2017   Lab Results  Component Value Date   TSH 2.09 05/16/2020   Lab Results  Component Value Date   CHOL 210 (H) 05/16/2020   HDL 65 05/16/2020   LDLCALC 127 (H) 05/16/2020   TRIG 84 05/16/2020   CHOLHDL 3.2 05/16/2020   Lab Results  Component Value Date   WBC 5.2 05/16/2020   HGB 14.2 05/16/2020   HCT 42.7 05/16/2020   MCV 84.4 05/16/2020   PLT 352 05/16/2020   Lab Results  Component Value Date   IRON 55 05/08/2013   TIBC 357 04/14/2012     Attestation Statements:   Reviewed by clinician on day of visit: allergies, medications, problem list, medical history, surgical history, family history, social history, and previous encounter notes.  Coral Ceo, CMA, am acting as Location manager for [employer], [place].  I have reviewed the above documentation for accuracy and completeness, and I agree with the above. -  Georgianne Fick, FNP

## 2020-06-03 NOTE — Telephone Encounter (Signed)
[place] called. 2. She had an MRI of her neck and has compression of her C-spine. She is being referred to neurosurgery. She wants a recommendation for a neurosurgeon.  I suggested Dr. Kristeen Miss.   Tillman Sers, MD, CDE

## 2020-06-03 NOTE — Telephone Encounter (Signed)
Called and let patient know she should take sooner appointment, she verbalized understanding and will call to schedule.

## 2020-06-03 NOTE — Telephone Encounter (Signed)
Suggest she take a sooner appt with these symptoms

## 2020-06-03 NOTE — Telephone Encounter (Signed)
Who's calling (name and relationship to patient) : Vernal (self)  Best contact number: [phone]  Provider they see: Dr. Tobe Sos  Reason for call:  Ms. Nagele called in requesting to speak with Dr. Tobe Sos. She is going to be having neck surgery and has a couple possible surgeons that would be doing surgery, would like to speak with Dr. Tobe Sos regarding this and if he is able to suggest any. Please advise   Call ID:      PRESCRIPTION REFILL ONLY  Name of prescription:  Pharmacy:

## 2020-06-03 NOTE — Telephone Encounter (Signed)
Alexa [name] called today to ask if you thought it would be alright to wait till July to get an appointment with Dr Vertell Limber which is who she would like at [facility] or should she go ahead and take an appointment next week with Dr Gildardo Cranker next week?

## 2020-06-03 NOTE — Telephone Encounter (Signed)
Spoke with patient and she informs that she was calling for some direction on which surgeon she should chose for the surgery. When trying to obtain more information that may be passed onto to Dr. Tobe Sos patient informs the surgical center is on the other line and needed to get back to them. Let her know this would be passed to him and she ended the call.

## 2020-06-05 ENCOUNTER — Other Ambulatory Visit (HOSPITAL_COMMUNITY): Payer: Self-pay

## 2020-06-10 ENCOUNTER — Other Ambulatory Visit (HOSPITAL_COMMUNITY): Payer: Self-pay

## 2020-06-13 DIAGNOSIS — M4316 Spondylolisthesis, lumbar region: Secondary | ICD-10-CM | POA: Insufficient documentation

## 2020-06-13 DIAGNOSIS — M47812 Spondylosis without myelopathy or radiculopathy, cervical region: Secondary | ICD-10-CM

## 2020-06-13 DIAGNOSIS — Z6828 Body mass index (BMI) 28.0-28.9, adult: Secondary | ICD-10-CM | POA: Diagnosis not present

## 2020-06-13 DIAGNOSIS — R030 Elevated blood-pressure reading, without diagnosis of hypertension: Secondary | ICD-10-CM | POA: Diagnosis not present

## 2020-06-13 HISTORY — DX: Spondylosis without myelopathy or radiculopathy, cervical region: M47.812

## 2020-06-20 ENCOUNTER — Other Ambulatory Visit (INDEPENDENT_AMBULATORY_CARE_PROVIDER_SITE_OTHER): Payer: Self-pay | Admitting: "Endocrinology

## 2020-06-20 ENCOUNTER — Other Ambulatory Visit (HOSPITAL_COMMUNITY): Payer: Self-pay

## 2020-06-20 DIAGNOSIS — IMO0002 Reserved for concepts with insufficient information to code with codable children: Secondary | ICD-10-CM

## 2020-06-20 DIAGNOSIS — E1165 Type 2 diabetes mellitus with hyperglycemia: Secondary | ICD-10-CM

## 2020-06-20 MED ORDER — METFORMIN HCL 1000 MG PO TABS
ORAL_TABLET | Freq: Two times a day (BID) | ORAL | 0 refills | Status: DC
  Filled 2020-06-20: qty 180, 90d supply, fill #0

## 2020-06-20 MED FILL — Pantoprazole Sodium EC Tab 40 MG (Base Equiv): ORAL | 90 days supply | Qty: 90 | Fill #0 | Status: AC

## 2020-06-20 MED FILL — Telmisartan Tab 40 MG: ORAL | 90 days supply | Qty: 90 | Fill #0 | Status: AC

## 2020-06-20 MED FILL — Levothyroxine Sodium Tab 25 MCG: ORAL | 90 days supply | Qty: 90 | Fill #0 | Status: AC

## 2020-06-23 NOTE — Patient Instructions (Signed)
Please have physical therapist continue to work with you for musculoskeletal pain.  There is no evidence of herniated disc in the cervical neck area.  Therapist can work on your shoulder issues.  If left arm issues continue we can do nerve conduction studies to evaluate for carpal tunnel syndrome.  Return in April 2019 for health maintenance exam.  He had a weakly positive ANA with no evidence of lupus which is common in Females.

## 2020-06-25 ENCOUNTER — Other Ambulatory Visit (HOSPITAL_COMMUNITY): Payer: Self-pay

## 2020-06-27 ENCOUNTER — Other Ambulatory Visit: Payer: Self-pay

## 2020-06-27 ENCOUNTER — Ambulatory Visit (INDEPENDENT_AMBULATORY_CARE_PROVIDER_SITE_OTHER): Payer: 59 | Admitting: Family Medicine

## 2020-06-27 ENCOUNTER — Encounter (INDEPENDENT_AMBULATORY_CARE_PROVIDER_SITE_OTHER): Payer: Self-pay | Admitting: Family Medicine

## 2020-06-27 ENCOUNTER — Other Ambulatory Visit (HOSPITAL_COMMUNITY): Payer: Self-pay

## 2020-06-27 VITALS — BP 124/86 | HR 74 | Temp 98.2°F | Ht 63.0 in | Wt 160.0 lb

## 2020-06-27 DIAGNOSIS — E1122 Type 2 diabetes mellitus with diabetic chronic kidney disease: Secondary | ICD-10-CM

## 2020-06-27 DIAGNOSIS — E663 Overweight: Secondary | ICD-10-CM

## 2020-06-27 DIAGNOSIS — Z9114 Patient's other noncompliance with medication regimen: Secondary | ICD-10-CM

## 2020-06-27 DIAGNOSIS — Z6829 Body mass index (BMI) 29.0-29.9, adult: Secondary | ICD-10-CM

## 2020-06-27 DIAGNOSIS — N1831 Chronic kidney disease, stage 3a: Secondary | ICD-10-CM | POA: Diagnosis not present

## 2020-06-27 MED ORDER — SEMAGLUTIDE-WEIGHT MANAGEMENT 2.4 MG/0.75ML ~~LOC~~ SOAJ
2.4000 mg | SUBCUTANEOUS | 0 refills | Status: DC
  Filled 2020-06-27: qty 3, 28d supply, fill #0

## 2020-07-01 DIAGNOSIS — F39 Unspecified mood [affective] disorder: Secondary | ICD-10-CM | POA: Diagnosis not present

## 2020-07-03 NOTE — Progress Notes (Signed)
Chief Complaint:   OBESITY Alexa [name] is here to discuss her progress with her obesity treatment plan along with follow-up of her obesity related diagnoses. Alexa [name] is on practicing portion control and making smarter food choices, such as increasing vegetables and decreasing simple carbohydrates (not getting in protein). Alexa [name] states she is not exercising regularly.  Today's visit was #: 24 Starting weight: 169 lbs Starting date: 11/17/2019 Today's weight: 160 lbs Today's date: 06/27/2020 Total lbs lost to date: 1 lb Total lbs lost since last in-office visit: 9 lbs  Interim History: Alexa [name] reports being very tired.  They are very short handed at work and she has also been busy helping friends outside of work.  Her endocrinologist would like her to weigh 150 pounds.  She is 160 pounds today.   She rarely has sodas and is not eating candy.  These have been issues for her in the past. She is not doing well with protein intake on work days.  She is now part time and is working two 12 hour shifts per week.  Subjective:   1. Type 2 diabetes mellitus with stage 3a chronic kidney disease, without long-term current use of insulin (HCC) A1c is 7.5 (high) but down from 8.5 six months ago.  She is compliant with Wegovy, but not metformin.  Diabetes is managed by Dr. Tobe Sos.  She will try to be compliant with metformin.  Lab Results  Component Value Date   HGBA1C 7.5 (H) 05/16/2020   HGBA1C 8.5 (A) 12/11/2019   HGBA1C 7.2 (A) 09/08/2019   Lab Results  Component Value Date   MICROALBUR 1.8 05/16/2020   LDLCALC 127 (H) 05/16/2020   CREATININE 1.03 05/16/2020   2. Noncompliance with medication regimen She says she has not taken any of her medications except for [facility] over the past few weeks.  She over-extends herself with work and helping others.  She also reports anger at being over-extended.  Assessment/Plan:   1. Type 2 diabetes mellitus with stage 3a chronic kidney disease, without  long-term current use of insulin ([place]) She will try to be compliant with metformin.  Will refill Wegovy 2.4 mg subcutaneously weekly today, as per below.  - Refill Semaglutide-Weight Management 2.4 MG/0.75ML SOAJ; Inject 2.4 mg into the skin once a week.  Dispense: 3 mL; Refill: 0  2. Non compliance w medication regimen She will work with her counselor to improve self care.  This has been an ongoing issue.  3. Overweight: Current BMI 28.35  Alexa [name] is currently in the action stage of change. As such, her goal is to continue with weight loss efforts. She has agreed to practicing portion control and making smarter food choices, such as increasing vegetables and decreasing simple carbohydrates with 80 grams of protein daily.   Exercise goals: No exercise has been prescribed at this time.  Behavioral modification strategies: increasing lean protein intake.  Alexa [name] has agreed to follow-up with our clinic in 4 weeks.  Objective:   Blood pressure 124/86, pulse 74, temperature 98.2 F (36.8 C), height 5\' 3"  (1.6 m), weight 160 lb (72.6 kg), last menstrual period 05/11/2010, SpO2 98 %. Body mass index is 28.34 kg/m.  General: Cooperative, alert, well developed, in no acute distress. HEENT: Conjunctivae and lids unremarkable. Cardiovascular: Regular rhythm.  Lungs: Normal work of breathing. Neurologic: No focal deficits.   Lab Results  Component Value Date   CREATININE 1.03 05/16/2020   BUN 15 05/16/2020   NA 138 05/16/2020   K 5.0  05/16/2020   CL 104 05/16/2020   CO2 23 05/16/2020   Lab Results  Component Value Date   ALT 11 05/16/2020   AST 12 05/16/2020   ALKPHOS 85 05/27/2016   BILITOT 0.5 05/16/2020   Lab Results  Component Value Date   HGBA1C 7.5 (H) 05/16/2020   HGBA1C 8.5 (A) 12/11/2019   HGBA1C 7.2 (A) 09/08/2019   HGBA1C 7.7 (A) 06/02/2019   HGBA1C 7.7 (A) 02/02/2019   Lab Results  Component Value Date   INSULIN 8.1 11/16/2017   Lab Results  Component Value  Date   TSH 2.09 05/16/2020   Lab Results  Component Value Date   CHOL 210 (H) 05/16/2020   HDL 65 05/16/2020   LDLCALC 127 (H) 05/16/2020   TRIG 84 05/16/2020   CHOLHDL 3.2 05/16/2020   Lab Results  Component Value Date   WBC 5.2 05/16/2020   HGB 14.2 05/16/2020   HCT 42.7 05/16/2020   MCV 84.4 05/16/2020   PLT 352 05/16/2020   Lab Results  Component Value Date   IRON 55 05/08/2013   TIBC 357 04/14/2012   Attestation Statements:   Reviewed by clinician on day of visit: allergies, medications, problem list, medical history, surgical history, family history, social history, and previous encounter notes.  I, Water quality scientist, CMA, am acting as Location manager for [employer], [place].  I have reviewed the above documentation for accuracy and completeness, and I agree with the above. - Georgianne Fick, FNP

## 2020-07-05 ENCOUNTER — Other Ambulatory Visit (HOSPITAL_COMMUNITY): Payer: Self-pay

## 2020-07-07 ENCOUNTER — Encounter (INDEPENDENT_AMBULATORY_CARE_PROVIDER_SITE_OTHER): Payer: Self-pay | Admitting: Family Medicine

## 2020-07-07 DIAGNOSIS — Z9114 Patient's other noncompliance with medication regimen: Secondary | ICD-10-CM | POA: Insufficient documentation

## 2020-07-07 DIAGNOSIS — Z91148 Patient's other noncompliance with medication regimen for other reason: Secondary | ICD-10-CM | POA: Insufficient documentation

## 2020-07-21 ENCOUNTER — Telehealth: Payer: 59

## 2020-07-23 ENCOUNTER — Telehealth: Payer: 59 | Admitting: Physician Assistant

## 2020-07-23 ENCOUNTER — Other Ambulatory Visit (HOSPITAL_COMMUNITY): Payer: Self-pay

## 2020-07-23 ENCOUNTER — Telehealth: Payer: Self-pay | Admitting: Internal Medicine

## 2020-07-23 ENCOUNTER — Other Ambulatory Visit: Payer: Self-pay

## 2020-07-23 ENCOUNTER — Ambulatory Visit: Payer: 59 | Admitting: Internal Medicine

## 2020-07-23 ENCOUNTER — Encounter: Payer: Self-pay | Admitting: Internal Medicine

## 2020-07-23 VITALS — BP 120/80 | HR 88 | Temp 97.9°F | Ht 63.0 in | Wt 165.0 lb

## 2020-07-23 DIAGNOSIS — R110 Nausea: Secondary | ICD-10-CM | POA: Diagnosis not present

## 2020-07-23 DIAGNOSIS — R21 Rash and other nonspecific skin eruption: Secondary | ICD-10-CM

## 2020-07-23 DIAGNOSIS — W57XXXA Bitten or stung by nonvenomous insect and other nonvenomous arthropods, initial encounter: Secondary | ICD-10-CM | POA: Diagnosis not present

## 2020-07-23 DIAGNOSIS — L309 Dermatitis, unspecified: Secondary | ICD-10-CM | POA: Diagnosis not present

## 2020-07-23 MED ORDER — DOXYCYCLINE HYCLATE 100 MG PO TABS
100.0000 mg | ORAL_TABLET | Freq: Two times a day (BID) | ORAL | 0 refills | Status: AC
  Filled 2020-07-23: qty 28, 14d supply, fill #0

## 2020-07-23 MED ORDER — VALACYCLOVIR HCL 1 G PO TABS
1000.0000 mg | ORAL_TABLET | Freq: Three times a day (TID) | ORAL | 0 refills | Status: AC
  Filled 2020-07-23: qty 21, 7d supply, fill #0

## 2020-07-23 MED ORDER — METHYLPREDNISOLONE ACETATE 80 MG/ML IJ SUSP
80.0000 mg | Freq: Once | INTRAMUSCULAR | Status: AC
  Administered 2020-07-23: 80 mg via INTRAMUSCULAR

## 2020-07-23 MED ORDER — ALPRAZOLAM 0.5 MG PO TABS
0.2500 mg | ORAL_TABLET | Freq: Two times a day (BID) | ORAL | 1 refills | Status: DC | PRN
  Filled 2020-07-23: qty 60, 30d supply, fill #0
  Filled 2020-08-25: qty 60, 30d supply, fill #1

## 2020-07-23 MED ORDER — AMCINONIDE 0.1 % EX CREA
TOPICAL_CREAM | Freq: Three times a day (TID) | CUTANEOUS | 1 refills | Status: DC
  Filled 2020-07-23 – 2020-07-24 (×2): qty 30, 30d supply, fill #0

## 2020-07-23 NOTE — Telephone Encounter (Signed)
Alexa [name] [phone]  Alexa [name] called to say 3 weeks ago she was bitten by a tick and on Saturday she started breaking out in a rash on her thighs, butt cheeks, stomach and few other places, has a headache and nausea. I ask if she had done a COVID test she said no, she thinks it has something to do with tick bite.

## 2020-07-23 NOTE — Progress Notes (Signed)
   Subjective:    Patient ID: Alexa [name], female    DOB: August 24, 1960, 60 y.o.   MRN: [id number]  HPI 60 year old Female in today to clarify some questions she has regarding a tick bite 3 weeks ago.  Patient started breaking out in a rash on her thighs, buttocks, abdomen.  Also has had headache and nausea.  She saw physician assistant through virtual visit who told her she had shingles earlier today at 11:15 AM.  Doxycycline and Valtrex were prescribed.  Was noted to have Colovesical follicular rash in clusters left inner thigh and inguinal region and right medial gluteus during that visit according to notes reviewed.    Review of Systems Patient is anxious.  Wants to make sure she does not have tickborne illness.  She was not satisfied with her virtual visit.  Has had some nausea.  No headache.     Objective:   Physical Exam Blood pressure 120/80 pulse 88 pulse oximetry 97% temperature 97.9 degrees BMI 29.23 skin: See area of tick bite on abdomen.  No evidence of secondary bacterial infection.  Has nonspecific dermatitis on her legs.  I do not think this is related to her tick bite.       Assessment & Plan:  Tick bite right abdomen-tick titers pending.  CBC drawn.  Nonspecific dermatitis both lower extremities  Plan: Cyclocort cream to use on abdomen and legs up to 3 times a day as needed.  Given Depo-Medrol 80 mg IM for rash on the legs.  Addendum: Cyclocort cream not covered by insurance.  Out-of-pocket cost would be over $200 according to pharmacy.  Betamethasone ointment recommended twice daily by pharmacist.  This was E scribed.  25 minutes spent with this visit including time spent seeing patient, reviewing previous ED visit, medical decision making, reviewing tick titers, speaking with pharmacist regarding topical medication that her insurance will cover.  Addendum: Titers for Lyme disease and RMSF are both negative.  CBC is within normal limits.  Patient was informed of  results by phone.

## 2020-07-23 NOTE — Patient Instructions (Signed)
Alexa [name], thank you for joining Leeanne Rio, PA-C for today's virtual visit.  While this provider is not your primary care provider (PCP), if your PCP is located in our provider database this encounter information will be shared with them immediately following your visit.  Consent: (Patient) Alexa [name] provided verbal consent for this virtual visit at the beginning of the encounter.  Current Medications:  Current Outpatient Medications:    albuterol (PROVENTIL) (2.5 MG/3ML) 0.083% nebulizer solution, Take 3 mLs (2.5 mg total) by nebulization every 6 (six) hours as needed for wheezing or shortness of breath., Disp: 150 mL, Rfl: 1   albuterol (VENTOLIN HFA) 108 (90 Base) MCG/ACT inhaler, Inhale 2 puffs into the lungs every 6 (six) hours as needed for wheezing or shortness of breath., Disp: 6.7 g, Rfl: 11   ALPRAZolam (XANAX) 0.5 MG tablet, Take 0.5-1 tablets (0.25-0.5 mg total) by mouth 2 (two) times daily as needed for anxiety., Disp: 60 tablet, Rfl: 1   Blood Glucose Monitoring Suppl (FREESTYLE LITE) DEVI, Use to check glucose 3x daily, Disp: 2 each, Rfl: 5   calcium-vitamin D (OSCAL WITH D) 500-200 MG-UNIT TABS tablet, Take by mouth., Disp: , Rfl:    carvedilol (COREG) 3.125 MG tablet, TAKE 1 TABLET BY MOUTH TWO TIMES DAILY WITH A MEAL, Disp: 120 tablet, Rfl: 10   Cholecalciferol 50 MCG (2000 UT) CAPS, Take 1 capsule by mouth daily., Disp: , Rfl:    conjugated estrogens (PREMARIN) vaginal cream, Place 1 applicatorful vaginally at bedtime for 21 days then off for 7 days., Disp: 30 g, Rfl: 11   Cyanocobalamin (VITAMIN B 12) 100 MCG LOZG, Take by mouth., Disp: , Rfl:    cyclobenzaprine (FLEXERIL) 10 MG tablet, Take 1 tablet (10 mg total) by mouth 3 (three) times daily as needed for muscle spasms., Disp: 90 tablet, Rfl: 0   diphenhydrAMINE (SOMINEX) 25 MG tablet, Take by mouth. , Disp: , Rfl:    Efinaconazole 10 % SOLN, Apply 1 drop topically daily., Disp: 4 mL, Rfl: 11    escitalopram (LEXAPRO) 20 MG tablet, Take 1 tablet (20 mg total) by mouth daily., Disp: 90 tablet, Rfl: 0   Ferrous Sulfate (SLOW FE PO), Take 1 tablet by mouth daily. , Disp: , Rfl:    fluconazole (DIFLUCAN) 150 MG tablet, TAKE 1 TABLET (150 MG TOTAL) BY MOUTH ONCE A WEEK., Disp: 4 tablet, Rfl: 2   glucose blood (FREESTYLE LITE) test strip, Use to check glucose 3x daily, Disp: 300 each, Rfl: 1   ibuprofen (ADVIL,MOTRIN) 200 MG tablet, Take 600 mg by mouth daily as needed for headache or moderate pain., Disp: , Rfl:    Insulin Pen Needle 32G X 4 MM MISC, Use with Saxenda daily, Disp: 100 each, Rfl: 0   levothyroxine (SYNTHROID) 25 MCG tablet, TAKE 1 TABLET BY MOUTH ONCE DAILY, Disp: 90 tablet, Rfl: 1   metFORMIN (GLUCOPHAGE) 1000 MG tablet, TAKE 1 TABLET BY MOUTH 2 TIMES DAILY WITH A MEAL., Disp: 180 tablet, Rfl: 0   Multiple Vitamin (MULTIVITAMIN) tablet, Take 1 tablet by mouth daily. Reported on 02/26/2015, Disp: 30 tablet, Rfl: 0   ondansetron (ZOFRAN) 4 MG tablet, Take 1 tablet (4 mg total) by mouth every 8 (eight) hours as needed for nausea or vomiting., Disp: 20 tablet, Rfl: 0   pantoprazole (PROTONIX) 40 MG tablet, TAKE 1 TABLET (40 MG TOTAL) BY MOUTH DAILY., Disp: 90 tablet, Rfl: 4   Semaglutide-Weight Management 2.4 MG/0.75ML SOAJ, Inject 2.4 mg into the  skin once a week., Disp: 3 mL, Rfl: 0   telmisartan (MICARDIS) 40 MG tablet, TAKE 1/2 TABLET BY MOUTH TWO TIMES DAILY, Disp: 90 tablet, Rfl: 3   terbinafine (LAMISIL) 250 MG tablet, TAKE 1 TABLET (250 MG TOTAL) BY MOUTH DAILY., Disp: 90 tablet, Rfl: 0   traMADol (ULTRAM) 50 MG tablet, Take 1 tablet (50 mg total) by mouth daily as needed for moderate pain or severe pain., Disp: 30 tablet, Rfl: 1   Vitamin D, Ergocalciferol, (DRISDOL) 50000 units CAPS capsule, Take 50,000 Units by mouth every Sunday., Disp: , Rfl:    Medications ordered in this encounter:  No orders of the defined types were placed in this encounter.    *If you need  refills on other medications prior to your next appointment, please contact your pharmacy*  Follow-Up: Call back or seek an in-person evaluation if the symptoms worsen or if the condition fails to improve as anticipated.  Other Instructions Please keep hydrated and get plenty of rest. Take the Valtrex as directed for shingles rash. Keep rash covered as you are contagious until rash has completely scabbed over.  You can apply topical lidocaine to the area to help numb itching. OTC antihistamines like Claritin or benadryl can also be beneficial.   I have sent in a script for the Doxycycline giving multiple known tick bites and current constitutional symptoms. Again these all can stem from the prodromal period with a shingles infection. I recommend follow-up with your primary care provider as discussed to get further testing.    If you have been instructed to have an in-person evaluation today at a local Urgent Care facility, please use the link below. It will take you to a list of all of our available [facility], including address, phone number and hours of operation. Please do not delay care.  [facility]  If you or a family member do not have a primary care provider, use the link below to schedule a visit and establish care. When you choose a [facility] primary care physician or advanced practice provider, you gain a long-term partner in health. Find a Primary Care Provider  Learn more about [facility]'s in-office and virtual care options: [facility]
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
## 2020-07-23 NOTE — Telephone Encounter (Signed)
When I called [facility] back to schedule appointment, she had done a video visit thru mychart and they said it was Shingles. However she wants to still do an actual office visit with you. So I scheduled for 3:15 today.

## 2020-07-24 ENCOUNTER — Telehealth: Payer: Self-pay | Admitting: Internal Medicine

## 2020-07-24 ENCOUNTER — Other Ambulatory Visit (HOSPITAL_COMMUNITY): Payer: Self-pay

## 2020-07-24 DIAGNOSIS — F39 Unspecified mood [affective] disorder: Secondary | ICD-10-CM | POA: Diagnosis not present

## 2020-07-24 MED ORDER — BETAMETHASONE VALERATE 0.1 % EX OINT
1.0000 "application " | TOPICAL_OINTMENT | Freq: Two times a day (BID) | CUTANEOUS | 1 refills | Status: DC
  Filled 2020-07-24: qty 30, 15d supply, fill #0

## 2020-07-24 NOTE — Telephone Encounter (Signed)
RMSF titer is negative. Left voice mail for patient. Lyme titer pending. Let patient know Cyclocort not covered and out of pocket is $200 so selected Betamethasone ointment twice daily.

## 2020-07-24 NOTE — Telephone Encounter (Signed)
Cyclocort (amcinonide) cream prescribed yesterday is not covered by insurance per pharmacist and out of pocket  expense is $200. We have been given a few alternatives. I have chosen Betamethasone 0.1% ointment to apply topically to rash twice a day.  Pharamcist says this comes in 30 grams. Have provided one refill. Time spent speaking with pharmacist about this  and medical decision making is 10 minutes.

## 2020-07-25 LAB — B. BURGDORFI ANTIBODIES BY WB
B burgdorferi IgG Abs (IB): NEGATIVE
B burgdorferi IgM Abs (IB): NEGATIVE
Lyme Disease 18 kD IgG: NONREACTIVE
Lyme Disease 23 kD IgG: NONREACTIVE
Lyme Disease 23 kD IgM: REACTIVE — AB
Lyme Disease 28 kD IgG: NONREACTIVE
Lyme Disease 30 kD IgG: NONREACTIVE
Lyme Disease 39 kD IgG: NONREACTIVE
Lyme Disease 39 kD IgM: NONREACTIVE
Lyme Disease 41 kD IgG: NONREACTIVE
Lyme Disease 41 kD IgM: NONREACTIVE
Lyme Disease 45 kD IgG: NONREACTIVE
Lyme Disease 58 kD IgG: NONREACTIVE
Lyme Disease 66 kD IgG: NONREACTIVE
Lyme Disease 93 kD IgG: NONREACTIVE

## 2020-07-25 LAB — CBC WITH DIFFERENTIAL/PLATELET
Absolute Monocytes: 503 cells/uL (ref 200–950)
Basophils Absolute: 59 cells/uL (ref 0–200)
Basophils Relative: 0.8 %
Eosinophils Absolute: 111 cells/uL (ref 15–500)
Eosinophils Relative: 1.5 %
HCT: 40 % (ref 35.0–45.0)
Hemoglobin: 12.9 g/dL (ref 11.7–15.5)
Lymphs Abs: 2708 cells/uL (ref 850–3900)
MCH: 27.5 pg (ref 27.0–33.0)
MCHC: 32.3 g/dL (ref 32.0–36.0)
MCV: 85.3 fL (ref 80.0–100.0)
MPV: 9.6 fL (ref 7.5–12.5)
Monocytes Relative: 6.8 %
Neutro Abs: 4018 cells/uL (ref 1500–7800)
Neutrophils Relative %: 54.3 %
Platelets: 407 10*3/uL — ABNORMAL HIGH (ref 140–400)
RBC: 4.69 10*6/uL (ref 3.80–5.10)
RDW: 13 % (ref 11.0–15.0)
Total Lymphocyte: 36.6 %
WBC: 7.4 10*3/uL (ref 3.8–10.8)

## 2020-07-25 LAB — ROCKY MTN SPOTTED FVR ABS PNL(IGG+IGM)
RMSF IgG: NOT DETECTED
RMSF IgM: NOT DETECTED

## 2020-07-31 ENCOUNTER — Other Ambulatory Visit: Payer: Self-pay

## 2020-07-31 ENCOUNTER — Other Ambulatory Visit (HOSPITAL_COMMUNITY): Payer: Self-pay

## 2020-07-31 ENCOUNTER — Encounter (INDEPENDENT_AMBULATORY_CARE_PROVIDER_SITE_OTHER): Payer: Self-pay | Admitting: Family Medicine

## 2020-07-31 ENCOUNTER — Telehealth (INDEPENDENT_AMBULATORY_CARE_PROVIDER_SITE_OTHER): Payer: 59 | Admitting: Family Medicine

## 2020-07-31 DIAGNOSIS — E1122 Type 2 diabetes mellitus with diabetic chronic kidney disease: Secondary | ICD-10-CM | POA: Diagnosis not present

## 2020-07-31 DIAGNOSIS — N1831 Chronic kidney disease, stage 3a: Secondary | ICD-10-CM | POA: Diagnosis not present

## 2020-07-31 DIAGNOSIS — F418 Other specified anxiety disorders: Secondary | ICD-10-CM | POA: Diagnosis not present

## 2020-07-31 DIAGNOSIS — Z6829 Body mass index (BMI) 29.0-29.9, adult: Secondary | ICD-10-CM | POA: Diagnosis not present

## 2020-07-31 DIAGNOSIS — M7501 Adhesive capsulitis of right shoulder: Secondary | ICD-10-CM | POA: Diagnosis not present

## 2020-07-31 DIAGNOSIS — E663 Overweight: Secondary | ICD-10-CM

## 2020-07-31 DIAGNOSIS — M7502 Adhesive capsulitis of left shoulder: Secondary | ICD-10-CM | POA: Diagnosis not present

## 2020-07-31 MED ORDER — SEMAGLUTIDE-WEIGHT MANAGEMENT 2.4 MG/0.75ML ~~LOC~~ SOAJ
2.4000 mg | SUBCUTANEOUS | 1 refills | Status: DC
  Filled 2020-07-31 – 2020-08-08 (×2): qty 3, 28d supply, fill #0
  Filled 2020-08-29 – 2020-09-06 (×3): qty 3, 28d supply, fill #1

## 2020-08-05 ENCOUNTER — Other Ambulatory Visit (HOSPITAL_COMMUNITY): Payer: Self-pay

## 2020-08-05 DIAGNOSIS — M7502 Adhesive capsulitis of left shoulder: Secondary | ICD-10-CM | POA: Diagnosis not present

## 2020-08-05 DIAGNOSIS — M25512 Pain in left shoulder: Secondary | ICD-10-CM | POA: Diagnosis not present

## 2020-08-05 DIAGNOSIS — M62522 Muscle wasting and atrophy, not elsewhere classified, left upper arm: Secondary | ICD-10-CM | POA: Diagnosis not present

## 2020-08-06 ENCOUNTER — Encounter (INDEPENDENT_AMBULATORY_CARE_PROVIDER_SITE_OTHER): Payer: Self-pay | Admitting: Family Medicine

## 2020-08-06 NOTE — Progress Notes (Signed)
TeleHealth Visit:  Due to the COVID-19 pandemic, this visit was completed with telemedicine (audio/video) technology to reduce patient and provider exposure as well as to preserve personal protective equipment.   Alexa [name] has verbally consented to this TeleHealth visit. The patient is located at home, the provider is located at the [facility] office. The participants in this visit include the listed provider and patient. The visit was conducted today via MyChart video.   Chief Complaint: OBESITY Alexa [name] is here to discuss her progress with her obesity treatment plan along with follow-up of her obesity related diagnoses. Alexa [name] is on practicing portion control and making smarter food choices, such as increasing vegetables and decreasing simple carbohydrates and states she is following her eating plan approximately 75% of the time. Alexa [name] states she is doing 0 minutes 0 times per week.  Today's visit was #: 25 Starting weight: 169 lbs Starting date: 11/17/2019  Interim History: Alexa [name]'s weight at home today is 160 lbs. She is in weight maintenance, and she has maintained her weight at goal (160 lbs). She is eating protein at all meals, but sometimes she only has 2 meals per day. She is overextended on days off work,and very fatigued.  She recently reduced work schedule to two 12 hour days/week.  Subjective:   1. Type 2 diabetes mellitus with stage 3a chronic kidney disease, without long-term current use of insulin (HCC) A1c is above goal (7.5 mg at last check). She is compliant with Wegovy, but not doing well with taking metformin. Denies hypoglycemia. She will see her Endocrinologist, Dr. Tobe Sos this month.  2. Depression with anxiety Alexa [name] struggles with self care in many ways: medication compliance, overextending herself with helping church members, and poor eating habits at times. She is doing fairly well with her eating habits currently. She sees a Social worker every 2  weeks.  Assessment/Plan:   1. Type 2 diabetes mellitus with stage 3a chronic kidney disease, without long-term current use of insulin (HCC) We will refill Wegovy 2.4 mg for 2 months. Alexa [name] will follow up with Dr. Tobe Sos as directed.  - Semaglutide-Weight Management 2.4 MG/0.75ML SOAJ; Inject 2.4 mg into the skin once a week.  Dispense: 3 mL; Refill: 1  2. Depression with anxiety  Alexa [name] will follow up with counselor as directed, and she will continue Lexapro.   3. Overweight: Current BMI 28.35 Alexa [name] is currently in the action stage of change. As such, her goal is to maintain weight for now. She has agreed to practicing portion control and making smarter food choices, such as increasing vegetables and decreasing simple carbohydrates with 80 grams of protein daily.   Exercise goals: All adults should avoid inactivity. Some physical activity is better than none, and adults who participate in any amount of physical activity gain some health benefits.  Behavioral modification strategies: increasing lean protein intake and decreasing simple carbohydrates.  Alexa [name] has agreed to follow-up with our clinic in 8 weeks.  Objective:   VITALS: Per patient if applicable, see vitals. GENERAL: Alert and in no acute distress. CARDIOPULMONARY: No increased WOB. Speaking in clear sentences.  PSYCH: Pleasant and cooperative. Speech normal rate and rhythm. Affect is appropriate. Insight and judgement are appropriate. Attention is focused, linear, and appropriate.  NEURO: Oriented as arrived to appointment on time with no prompting.   Lab Results  Component Value Date   CREATININE 1.03 05/16/2020   BUN 15 05/16/2020   NA 138 05/16/2020   K 5.0 05/16/2020   CL  104 05/16/2020   CO2 23 05/16/2020   Lab Results  Component Value Date   ALT 11 05/16/2020   AST 12 05/16/2020   ALKPHOS 85 05/27/2016   BILITOT 0.5 05/16/2020   Lab Results  Component Value Date   HGBA1C 7.5 (H) 05/16/2020   HGBA1C  8.5 (A) 12/11/2019   HGBA1C 7.2 (A) 09/08/2019   HGBA1C 7.7 (A) 06/02/2019   HGBA1C 7.7 (A) 02/02/2019   Lab Results  Component Value Date   INSULIN 8.1 11/16/2017   Lab Results  Component Value Date   TSH 2.09 05/16/2020   Lab Results  Component Value Date   CHOL 210 (H) 05/16/2020   HDL 65 05/16/2020   LDLCALC 127 (H) 05/16/2020   TRIG 84 05/16/2020   CHOLHDL 3.2 05/16/2020   Lab Results  Component Value Date   VD25OH 71 03/27/2019   VD25OH 64 11/01/2017   VD25OH 34 06/08/2017   Lab Results  Component Value Date   WBC 7.4 07/23/2020   HGB 12.9 07/23/2020   HCT 40.0 07/23/2020   MCV 85.3 07/23/2020   PLT 407 (H) 07/23/2020   Lab Results  Component Value Date   IRON 55 05/08/2013   TIBC 357 04/14/2012    Attestation Statements:   Reviewed by clinician on day of visit: allergies, medications, problem list, medical history, surgical history, family history, social history, and previous encounter notes.   Wilhemena Durie, am acting as Location manager for [employer], FNP-C.  I have reviewed the above documentation for accuracy and completeness, and I agree with the above. - Georgianne Fick, FNP

## 2020-08-07 ENCOUNTER — Encounter (INDEPENDENT_AMBULATORY_CARE_PROVIDER_SITE_OTHER): Payer: Self-pay | Admitting: Family Medicine

## 2020-08-08 ENCOUNTER — Other Ambulatory Visit (HOSPITAL_COMMUNITY): Payer: Self-pay

## 2020-08-09 DIAGNOSIS — M62522 Muscle wasting and atrophy, not elsewhere classified, left upper arm: Secondary | ICD-10-CM | POA: Diagnosis not present

## 2020-08-09 DIAGNOSIS — M7502 Adhesive capsulitis of left shoulder: Secondary | ICD-10-CM | POA: Diagnosis not present

## 2020-08-09 DIAGNOSIS — M25512 Pain in left shoulder: Secondary | ICD-10-CM | POA: Diagnosis not present

## 2020-08-13 ENCOUNTER — Other Ambulatory Visit (HOSPITAL_COMMUNITY): Payer: Self-pay

## 2020-08-13 ENCOUNTER — Encounter: Payer: Self-pay | Admitting: Podiatry

## 2020-08-13 ENCOUNTER — Ambulatory Visit: Payer: 59 | Admitting: Podiatry

## 2020-08-13 ENCOUNTER — Other Ambulatory Visit: Payer: Self-pay

## 2020-08-13 DIAGNOSIS — L600 Ingrowing nail: Secondary | ICD-10-CM | POA: Diagnosis not present

## 2020-08-13 DIAGNOSIS — M62522 Muscle wasting and atrophy, not elsewhere classified, left upper arm: Secondary | ICD-10-CM | POA: Diagnosis not present

## 2020-08-13 DIAGNOSIS — M25512 Pain in left shoulder: Secondary | ICD-10-CM | POA: Diagnosis not present

## 2020-08-13 DIAGNOSIS — B351 Tinea unguium: Secondary | ICD-10-CM | POA: Diagnosis not present

## 2020-08-13 DIAGNOSIS — M7502 Adhesive capsulitis of left shoulder: Secondary | ICD-10-CM | POA: Diagnosis not present

## 2020-08-13 MED ORDER — EFINACONAZOLE 10 % EX SOLN: 1.0000 [drp] | Freq: Every day | CUTANEOUS | 11 refills | Status: DC

## 2020-08-13 MED ORDER — FLUCONAZOLE 150 MG PO TABS
150.0000 mg | ORAL_TABLET | ORAL | 2 refills | Status: DC
  Filled 2020-08-13: qty 4, 28d supply, fill #0
  Filled 2020-09-02 – 2020-09-04 (×2): qty 4, 28d supply, fill #1
  Filled 2020-10-06 – 2020-10-17 (×2): qty 4, 28d supply, fill #2

## 2020-08-14 ENCOUNTER — Other Ambulatory Visit (HOSPITAL_COMMUNITY): Payer: Self-pay

## 2020-08-14 MED ORDER — ZOSTER VAC RECOMB ADJUVANTED 50 MCG/0.5ML IM SUSR
0.5000 mL | Freq: Every day | INTRAMUSCULAR | 1 refills | Status: DC
  Filled 2020-08-14: qty 0.5, 1d supply, fill #0
  Filled 2020-09-26 – 2020-10-22 (×3): qty 0.5, 1d supply, fill #1

## 2020-08-15 ENCOUNTER — Other Ambulatory Visit (HOSPITAL_COMMUNITY): Payer: Self-pay

## 2020-08-16 ENCOUNTER — Other Ambulatory Visit (HOSPITAL_COMMUNITY): Payer: Self-pay

## 2020-08-16 DIAGNOSIS — M62522 Muscle wasting and atrophy, not elsewhere classified, left upper arm: Secondary | ICD-10-CM | POA: Diagnosis not present

## 2020-08-16 DIAGNOSIS — M7502 Adhesive capsulitis of left shoulder: Secondary | ICD-10-CM | POA: Diagnosis not present

## 2020-08-16 DIAGNOSIS — M25512 Pain in left shoulder: Secondary | ICD-10-CM | POA: Diagnosis not present

## 2020-08-17 NOTE — Progress Notes (Signed)
Subjective: 60 year old female presents the office today for follow-up evaluation of onychomycosis.  She states that she thinks that the nail cleared up most on the right side but has come back.  She felt that the fluconazole was helpful that she was put on previously.  Also she had seen Dr. March Rummage last year for ingrown toenail on the left big toe.  States that after he trimmed it felt better but is coming back.  No swelling or redness or drainage to the toenail sites.  Occasional discomfort.    Objective: AAO x3, NAD DP/PT pulses palpable bilaterally, CRT less than 3 seconds There is still yellow-brown discoloration of the toenails of the left side worse than the right.  There is no pain in the nails there is no redness or drainage.  Concerned about the coloration becoming more on the proximal nail.  Incurvation of nail without significant edema, erythema or any discomfort. No open lesions or pre-ulcerative lesions.  No pain with calf compression, swelling, warmth, erythema  Assessment: Onychomycosis, ingrown toenail  Plan: -All treatment options discussed with the patient including all alternatives, risks, complications.  -We will restart fluconazole.  Also as a courtesy we will do 2 more treatments of laser no cost. -Discussed partial nail avulsion but also hold off on that if needed in the future we will.  Trula Slade DPM

## 2020-08-18 NOTE — Patient Instructions (Addendum)
Tick titers have been drawn including RMSF and Lyme disease.  Use Cyclocort cream on abdomen up to 3 times a day as needed.  Depo-Medrol 80 mg IM given for rash on legs.  This appears to be a nonspecific dermatitis and not related to the tick bite on her abdomen.  We will notify you of the results of your tick titers.  Addendum: Betamethasone ointment suggested by pharmacist and that apparently is covered by her insurance to use on the leg rash.  Xanax refilled for anxiety

## 2020-08-20 ENCOUNTER — Other Ambulatory Visit (HOSPITAL_COMMUNITY): Payer: Self-pay

## 2020-08-21 DIAGNOSIS — M62522 Muscle wasting and atrophy, not elsewhere classified, left upper arm: Secondary | ICD-10-CM | POA: Diagnosis not present

## 2020-08-21 DIAGNOSIS — M25512 Pain in left shoulder: Secondary | ICD-10-CM | POA: Diagnosis not present

## 2020-08-21 DIAGNOSIS — M7502 Adhesive capsulitis of left shoulder: Secondary | ICD-10-CM | POA: Diagnosis not present

## 2020-08-22 ENCOUNTER — Ambulatory Visit (INDEPENDENT_AMBULATORY_CARE_PROVIDER_SITE_OTHER): Payer: 59 | Admitting: "Endocrinology

## 2020-08-26 ENCOUNTER — Other Ambulatory Visit (HOSPITAL_COMMUNITY): Payer: Self-pay

## 2020-08-26 DIAGNOSIS — M25512 Pain in left shoulder: Secondary | ICD-10-CM | POA: Diagnosis not present

## 2020-08-26 DIAGNOSIS — M62522 Muscle wasting and atrophy, not elsewhere classified, left upper arm: Secondary | ICD-10-CM | POA: Diagnosis not present

## 2020-08-26 DIAGNOSIS — M7502 Adhesive capsulitis of left shoulder: Secondary | ICD-10-CM | POA: Diagnosis not present

## 2020-08-27 ENCOUNTER — Other Ambulatory Visit (HOSPITAL_COMMUNITY): Payer: Self-pay

## 2020-08-30 ENCOUNTER — Other Ambulatory Visit (HOSPITAL_COMMUNITY): Payer: Self-pay

## 2020-08-30 DIAGNOSIS — M62522 Muscle wasting and atrophy, not elsewhere classified, left upper arm: Secondary | ICD-10-CM | POA: Diagnosis not present

## 2020-08-30 DIAGNOSIS — M7502 Adhesive capsulitis of left shoulder: Secondary | ICD-10-CM | POA: Diagnosis not present

## 2020-08-30 DIAGNOSIS — M25512 Pain in left shoulder: Secondary | ICD-10-CM | POA: Diagnosis not present

## 2020-09-02 ENCOUNTER — Encounter (INDEPENDENT_AMBULATORY_CARE_PROVIDER_SITE_OTHER): Payer: Self-pay | Admitting: Family Medicine

## 2020-09-02 ENCOUNTER — Other Ambulatory Visit (HOSPITAL_COMMUNITY): Payer: Self-pay

## 2020-09-02 DIAGNOSIS — F39 Unspecified mood [affective] disorder: Secondary | ICD-10-CM | POA: Diagnosis not present

## 2020-09-03 DIAGNOSIS — M25512 Pain in left shoulder: Secondary | ICD-10-CM | POA: Diagnosis not present

## 2020-09-03 DIAGNOSIS — M62522 Muscle wasting and atrophy, not elsewhere classified, left upper arm: Secondary | ICD-10-CM | POA: Diagnosis not present

## 2020-09-03 DIAGNOSIS — M7502 Adhesive capsulitis of left shoulder: Secondary | ICD-10-CM | POA: Diagnosis not present

## 2020-09-04 ENCOUNTER — Other Ambulatory Visit (HOSPITAL_COMMUNITY): Payer: Self-pay

## 2020-09-06 ENCOUNTER — Other Ambulatory Visit: Payer: Self-pay

## 2020-09-06 ENCOUNTER — Other Ambulatory Visit (HOSPITAL_COMMUNITY): Payer: Self-pay

## 2020-09-06 ENCOUNTER — Ambulatory Visit (INDEPENDENT_AMBULATORY_CARE_PROVIDER_SITE_OTHER): Payer: 59 | Admitting: *Deleted

## 2020-09-06 DIAGNOSIS — B351 Tinea unguium: Secondary | ICD-10-CM

## 2020-09-06 NOTE — Progress Notes (Signed)
Patient presents today for the 1st of 2 laser treatment. Diagnosed with mycotic nail infection by Dr. Jacqualyn Posey.   Toenail most affected are hallux bilateral.  All other systems are negative.  Nails were filed thin. Laser therapy was administered to 1st toenails bilateral and patient tolerated the treatment well. All safety precautions were in place.   Patient has had laser treatments in the past. She's had some discoloration starting on the nails and is concerned it will get worse if not taken care of now. Dr. Jacqualyn Posey has her on fluconazole one pill weekly for the next 2 months.   I also offered to set her up on laser maintenance once these 2 treatments have been completed. She was interested in this for preventative.   Follow up in 6 weeks for laser # 2, then she will see Dr. Jacqualyn Posey for follow up of fluconazole, then possibly start laser maintenance if she chooses.    These 2 laser treatments are complimentary per Dr. Jacqualyn Posey.

## 2020-09-18 DIAGNOSIS — M62522 Muscle wasting and atrophy, not elsewhere classified, left upper arm: Secondary | ICD-10-CM | POA: Diagnosis not present

## 2020-09-18 DIAGNOSIS — M25512 Pain in left shoulder: Secondary | ICD-10-CM | POA: Diagnosis not present

## 2020-09-18 DIAGNOSIS — M7502 Adhesive capsulitis of left shoulder: Secondary | ICD-10-CM | POA: Diagnosis not present

## 2020-09-19 ENCOUNTER — Other Ambulatory Visit: Payer: Self-pay

## 2020-09-19 ENCOUNTER — Ambulatory Visit (INDEPENDENT_AMBULATORY_CARE_PROVIDER_SITE_OTHER): Payer: 59 | Admitting: Family Medicine

## 2020-09-19 ENCOUNTER — Other Ambulatory Visit (HOSPITAL_COMMUNITY): Payer: Self-pay

## 2020-09-19 ENCOUNTER — Encounter (INDEPENDENT_AMBULATORY_CARE_PROVIDER_SITE_OTHER): Payer: Self-pay | Admitting: Family Medicine

## 2020-09-19 VITALS — BP 138/85 | HR 84 | Temp 97.7°F | Ht 63.0 in | Wt 146.0 lb

## 2020-09-19 DIAGNOSIS — Z9189 Other specified personal risk factors, not elsewhere classified: Secondary | ICD-10-CM | POA: Diagnosis not present

## 2020-09-19 DIAGNOSIS — F418 Other specified anxiety disorders: Secondary | ICD-10-CM

## 2020-09-19 DIAGNOSIS — E663 Overweight: Secondary | ICD-10-CM | POA: Diagnosis not present

## 2020-09-19 DIAGNOSIS — N1831 Chronic kidney disease, stage 3a: Secondary | ICD-10-CM

## 2020-09-19 DIAGNOSIS — Z6829 Body mass index (BMI) 29.0-29.9, adult: Secondary | ICD-10-CM

## 2020-09-19 DIAGNOSIS — E1122 Type 2 diabetes mellitus with diabetic chronic kidney disease: Secondary | ICD-10-CM | POA: Diagnosis not present

## 2020-09-19 MED ORDER — FREESTYLE LITE TEST VI STRP
ORAL_STRIP | 1 refills | Status: AC
  Filled 2020-09-19: qty 200, 67d supply, fill #0

## 2020-09-19 MED ORDER — SEMAGLUTIDE-WEIGHT MANAGEMENT 2.4 MG/0.75ML ~~LOC~~ SOAJ
2.4000 mg | SUBCUTANEOUS | 1 refills | Status: DC
  Filled 2020-09-19 – 2020-09-26 (×2): qty 3, 28d supply, fill #0

## 2020-09-19 NOTE — Progress Notes (Signed)
Chief Complaint:   OBESITY Alexa [name] is here to discuss her progress with her obesity treatment plan along with follow-up of her obesity related diagnoses. Alexa [name] is on practicing portion control and making smarter food choices, such as increasing vegetables and decreasing simple carbohydrates and states she is following her eating plan approximately 0% of the time. Alexa [name] states she is doing 0 minutes 0 times per week.  Today's visit was #: 26 Starting weight: 169 lbs Starting date: 11/17/2019 Today's weight: 146 lbs Today's date: 09/19/2020 Total lbs lost to date: 23 lbs Total lbs lost since last in-office visit: 14 lbs  Interim History: Alexa [name] says she is not eating enough. She is really working on getting in protein at least 2 times per day. She is having minimal candy and soda. Too much candy and soda has frequently been an issue for her.She reports non-compliance with medicines other than [facility] which is also consistently an issue for her.  Subjective:   1. Type 2 diabetes mellitus with stage 3a chronic kidney disease, without long-term current use of insulin (HCC) Alexa [name] has been compliant with Wegovy. Her FBG today was 140. Her 2 hr. PP aroung 180-200. Dr Tobe Sos reduced her Metformin to 500 mg twice daily. She denies hypoglycemia. She is due a FU with Dr. Tobe Sos but does not have an appt.   Lab Results  Component Value Date   HGBA1C 7.5 (H) 05/16/2020   HGBA1C 8.5 (A) 12/11/2019   HGBA1C 7.2 (A) 09/08/2019   Lab Results  Component Value Date   MICROALBUR 1.8 05/16/2020   LDLCALC 127 (H) 05/16/2020   CREATININE 1.03 05/16/2020   Lab Results  Component Value Date   INSULIN 8.1 11/16/2017    2. Depression with anxiety Alexa [name] sees a counselor every 2 weeks. She say she is in "crisis mode" and feeling very angry and stressed due to several issues. She says that her medication non-compliance relates to deep seated psychological issues.  3. At risk for impaired  metabolic function Alexa [name] is at risk for impaired metabolic function due to loss of muscle mass. She was advised at a recent PT appointment that she was very weak.  Assessment/Plan:   1. Type 2 diabetes mellitus with stage 3a chronic kidney disease, without long-term current use of insulin ([name]) [name] will set up follow up with Dr. Tobe Sos. We will refill Wegovy 2.4 mg. We will refill test strips.  - Semaglutide-Weight Management 2.4 MG/0.75ML SOAJ; Inject 2.4 mg into the skin once a week.  Dispense: 3 mL; Refill: 1 - glucose blood (FREESTYLE LITE) test strip; Use 1 to check blood glucose 3 times daily.  Dispense: 300 each; Refill: 1  2. Depression with anxiety [name] will continue to se counselor.   3. At risk for impaired metabolic function [name] was given approximately 15 minutes of impaired  metabolic function prevention counseling today. We discussed intensive lifestyle modifications today with an emphasis on specific nutrition and exercise instructions and strategies.   Repetitive spaced learning was employed today to elicit superior memory formation and behavioral change.   4. Overweight: Current BMI 25.87 Alexa [name] is currently in the action stage of change. As such, her goal is to maintain weight for now. She has agreed to practicing portion control and making smarter food choices, such as increasing vegetables and decreasing simple carbohydrates.   We discussed that she is at goal with her weight and should work on increasing protein and adding resistance training.  Exercise goals:  Alexa [name] is  doing physical therapy 2 times per week. Encouraged resistance training.  Behavioral modification strategies: increasing lean protein intake.  [name] has agreed to follow-up with our clinic in 8 weeks.  Objective:   Blood pressure 138/85, pulse 84, temperature 97.7 F (36.5 C), height '5\' 3"'$  (1.6 m), weight 146 lb (66.2 kg), last menstrual period 05/11/2010, SpO2 98 %. Body mass index is  25.86 kg/m.  General: Cooperative, alert, well developed, in no acute distress. HEENT: Conjunctivae and lids unremarkable. Cardiovascular: Regular rhythm.  Lungs: Normal work of breathing. Neurologic: No focal deficits.   Lab Results  Component Value Date   CREATININE 1.03 05/16/2020   BUN 15 05/16/2020   NA 138 05/16/2020   K 5.0 05/16/2020   CL 104 05/16/2020   CO2 23 05/16/2020   Lab Results  Component Value Date   ALT 11 05/16/2020   AST 12 05/16/2020   ALKPHOS 85 05/27/2016   BILITOT 0.5 05/16/2020   Lab Results  Component Value Date   HGBA1C 7.5 (H) 05/16/2020   HGBA1C 8.5 (A) 12/11/2019   HGBA1C 7.2 (A) 09/08/2019   HGBA1C 7.7 (A) 06/02/2019   HGBA1C 7.7 (A) 02/02/2019   Lab Results  Component Value Date   INSULIN 8.1 11/16/2017   Lab Results  Component Value Date   TSH 2.09 05/16/2020   Lab Results  Component Value Date   CHOL 210 (H) 05/16/2020   HDL 65 05/16/2020   LDLCALC 127 (H) 05/16/2020   TRIG 84 05/16/2020   CHOLHDL 3.2 05/16/2020   Lab Results  Component Value Date   VD25OH 71 03/27/2019   VD25OH 64 11/01/2017   VD25OH 34 06/08/2017   Lab Results  Component Value Date   WBC 7.4 07/23/2020   HGB 12.9 07/23/2020   HCT 40.0 07/23/2020   MCV 85.3 07/23/2020   PLT 407 (H) 07/23/2020   Lab Results  Component Value Date   IRON 55 05/08/2013   TIBC 357 04/14/2012   Attestation Statements:   Reviewed by clinician on day of visit: allergies, medications, problem list, medical history, surgical history, family history, social history, and previous encounter notes.  I, Lizbeth Bark, RMA, am acting as Location manager for [employer], [place].   I have reviewed the above documentation for accuracy and completeness, and I agree with the above. -  Georgianne Fick, FNP

## 2020-09-20 DIAGNOSIS — M25512 Pain in left shoulder: Secondary | ICD-10-CM | POA: Diagnosis not present

## 2020-09-20 DIAGNOSIS — M7502 Adhesive capsulitis of left shoulder: Secondary | ICD-10-CM | POA: Diagnosis not present

## 2020-09-20 DIAGNOSIS — M62522 Muscle wasting and atrophy, not elsewhere classified, left upper arm: Secondary | ICD-10-CM | POA: Diagnosis not present

## 2020-09-21 ENCOUNTER — Encounter (INDEPENDENT_AMBULATORY_CARE_PROVIDER_SITE_OTHER): Payer: Self-pay | Admitting: Family Medicine

## 2020-09-23 DIAGNOSIS — M25512 Pain in left shoulder: Secondary | ICD-10-CM | POA: Diagnosis not present

## 2020-09-23 DIAGNOSIS — M7502 Adhesive capsulitis of left shoulder: Secondary | ICD-10-CM | POA: Diagnosis not present

## 2020-09-23 DIAGNOSIS — M62522 Muscle wasting and atrophy, not elsewhere classified, left upper arm: Secondary | ICD-10-CM | POA: Diagnosis not present

## 2020-09-26 ENCOUNTER — Other Ambulatory Visit (HOSPITAL_COMMUNITY): Payer: Self-pay

## 2020-09-26 DIAGNOSIS — M25512 Pain in left shoulder: Secondary | ICD-10-CM | POA: Diagnosis not present

## 2020-09-26 DIAGNOSIS — M62522 Muscle wasting and atrophy, not elsewhere classified, left upper arm: Secondary | ICD-10-CM | POA: Diagnosis not present

## 2020-09-26 DIAGNOSIS — M7502 Adhesive capsulitis of left shoulder: Secondary | ICD-10-CM | POA: Diagnosis not present

## 2020-09-27 ENCOUNTER — Other Ambulatory Visit (HOSPITAL_COMMUNITY): Payer: Self-pay

## 2020-09-30 NOTE — Progress Notes (Signed)
Subjective:  Patient Name: Alexa [name] Date of Birth: February 04, 1960  MRN: [id number]  Mystic Labo  presents to the office today for follow-up of her type 2 diabetes mellitus, obesity, combined hyperlipidemia, GERD, hypertension, ASHD, dyspepsia, pedal edema, non-proliferative diabetic retinopathy, goiter, depression, autonomic neuropathy, tachycardia, peripheral neuropathy, acquired hypothyroidism, vitamin D deficiency, hypocalcemia, secondary hyperparathyroidism, glossitis, pallor, fatigue, iron deficiency anemia, anhedonia, disinclination to take medicines, and status post Roux-en-Y gastric bypass.   HISTORY OF PRESENT ILLNESS:   Alexa [name] is a 60 y.o. Caucasian woman. Alexa [name] was unaccompanied.  1. The patient first presented to me on 08/20/04 in referral from her primary care internist, Dr. Emeline General, for evaluation and management of her type 2 diabetes, obesity, and multiple medical issues. She was 60 years old.   [name] had a long history of obesity. She was heavy as a child. She underwent menarche at age 60. At age 51 she was 180 pounds. In 1996 she weighed 218 pounds. In 1999 she was diagnosed with type 2 diabetes mellitus. Her weight at that point was 250 pounds. She was treated with Glucophage, and Actos. Actos made her gain even more weight. She had also been treated with glipizide in the past. More recently she had been treated with Lantus and Glucophage plus regular insulin as needed, especially for when she took steroids for asthma attacks. Maximum weight had been 296 pounds one month prior to that first visit with me. Her tendency to gain weight and her difficulty in losing weight were aggravated by long-standing, intermittent depression and by severe, recurrent asthma requiring the use of steroid medications.  B. Past medical history was also positive for a 60% blockage in one of her coronary arteries. She had significant issues with GERD and dyspepsia. She also had combined  hyperlipidemia. She had a previous cardiac catheterization and previous tonsillectomy. She was allergic to theophylline. Her pertinent review of systems was positive for some numbness and tingling in her feet. Family history was positive for type 2 diabetes in her father and her maternal grandmother. Her brother weighed 250 pounds at a height of 76 inches. Both her mother and her sister were hypothyroid.  C. On physical examination, her weight was 279.9 pounds. Her BMI was 49.6. Her blood pressure was 142/86. Her heart rate was 96. Her hemoglobin A1c was 9.8%. She was alert and oriented x3. Her affect was normal. Her insight was fair. She was obviously quite obese. She had a 25 gm thyroid gland. She had 1+ tremor of her hands. She had 1+ DP pulses and 2+ tinea pedis. Sensation to touch was intact in her feet. Laboratory data included a normal CMP. Her cholesterol was 175, triglycerides 76, HDL 53, and LDL 107. Her TSH was 2.98. Since she obviously did have type 2 diabetes mellitus associated with morbid obesity, and since weight loss was a major factor for her, I asked her to resume her metformin twice daily. I also started her on Byetta, initially 5 mcg twice daily and later 10 mcg twice daily. I discontinued her Lantus insulin.  2. During the last 16 years,we have had some successes, some failures, and some new problem areas.  A. T2DM: In October 2007 the combination of Byetta and metformin was causing more gastrointestinal problems. She opted to stop the metformin and continue the Byetta because it was helping her with weight control and blood sugar control. By 08/11/06 her weight had decreased to 267.6 pounds. Hemoglobin A1c was 8.6%. At that point  she decided to have bariatric surgery. She had a Roux-en-Y gastric bypass on 12/20/2006. She subsequently lost weight down to 173.4 pounds on 05/23/08, but then subsequently regained weight to the 190s. Her  hemoglobin A1c values varied in parallel with her weights.  On 05/23/08, at the point of her lowest weight, her hemoglobin A1c dropped to a nadir of 6.3%.  Since then her hemoglobin A1c values have varied between 6.6 and 13.4%. Although we were initially able to stop all of her diabetes medicines after bariatric surgery, when she began to regain weight we restarted metformin, 500 mg twice daily. Although she took her metformin twice daily for a long time, she had discontinued it many years ago. She hated to take medicines and could not make herself be compliant in taking medications or exercising. When her HbA1c increased to 9.4% in September 2015, I stopped her Byetta injections and started her on liraglutide (Victoza) daily injections on 11/16/13. After several more years of noncompliance, she has been doing much better in the past 2 years.   B. Vitamin D deficiency, hypocalcemia, and secondary hyperparathyroidism: Just before her gastric bypass, I obtained baseline bone mineral metabolism studies. Her 25-hydroxy vitamin D was very low at 7 (normal greater than or equal to 30). Her calcium was 9.5 (normal 8.6-10.6). Her parathyroid hormone was 38.4 (normal 14-72). Subsequent to her surgery, the patient was supposed to be taking multivitamins with calcium and vitamin D, but did not always do so. On 03/29/2007 her 25-hydroxy vitamin D was 29, but her calcium decreased to 8.7. Her PTH was slightly elevated at 73.5. Her 1,25-hydroxy vitamin D was 46. Her iron was 56. I asked her to make sure she took her multivitamins and calcium daily. Unfortunately, she has often been non-compliant with her multivitamins and calcium. Her 25-hydroxy vitamin D values have varied between 15-35. Her calcium values have varied between 8.1-9.4. Her PTH values have remained elevated between 79-167. Alexa [name]'s iron levels have also been low, between 32-34. She was supposed to be taking iron every day, but frequently did not do so. In the past year, however, she has been doing better at taking her  vitamin D, calcium, and iron. In May 2019 her vitamin D was at the lower end of the reference range, her calcium was about the 40% of the reference range, but her PTH was still significantly elevated. In December 2020 her PTH and calcium were mid-normal.   C. Alexa [name] has had psychological issues for many years that have adversely affected her eating, her unwillingness to exercise, her noncompliance with taking medications, her weight, and her T2DM care. Fortunately, her recent outpatient psychological therapy with Ms. Rea College, RN, MS and her taking Lexapro regularly have resulted in Alexa [name] having a much more positive attitude about life in general and a generally more positive attitude about taking her medications. Fortunately, Alexa [name]'s has been dealing successfully with her issues and her compliance has markedly improved.   D. Obesity: Although Alexa [name] regained a great deal of weight in the years following her bariatric surgery, she has been taking Contrave more recently and has been able to lose all of the weight that she had gained after her bariatric surgery.   3. Alexa [name]'s last [facility] visit was on 05/20/20. At that visit I asked her to continue her morning dose of metformin of 1000 mg and continue the evening dose of 1000 mg. I continued her 25 mcg Synthroid dose at bedtime. I asked her to take one Biotech, 50,000 IU  capsule of vitamin D weekly and calcium, 600 mg, at each meal.   A. In the interim she has been having some left shoulder pains and back problems.  B. She has been taking Wegovy 2.6 mg weekly. Her initial GI problems have resolved.  C. She had not been taking metformin for several months due to concerns about possibly having low BGs, but for the past week has been taking the metformin 500 mg, twice daily without having hypoglycemia.  D. She stopped taking  all of her other medications until last week, when she resumed taking them. She is taking Lexapro, 20 mg/day; Synthroid, 25 mcg/day;  Micardis, 20 mg, twice daily; Protonix, 40 mg/day; metformin 500 mg on the morning and 500 mg in the evening. She has not been taking any calcium. She is also taking Coreg, 3.125 mg, twice daily. She is also taking B12 daily, Biotech once weekly. She is not taking oral fungal treatment for her tinea pedis. She has not been taking calcium for months and is still not doing so.   E. She pretty much stopped drinking cokes.  F. Her father has had multiple kidney stones, sepsis, a stoke, and a possible lung cancer. He has also had several ministrokes and has had personality changes. She is spending more time with him and her mother.   G. Dr. Renold Genta referred her to nephrology, Dr. Joylene Grapes. Her serum creatinine decreased to 0.92. She was initially diagnosed with stage 3A renal disease. Her renal US in August 2021 was "unremarkable". At her second visit Dr. Joylene Grapes told her not to come back.   H. Her husband has heart failure. He is doing "okay". He takes his medicine.   I. She continues to have intermittent shakiness of her hands that has been diagnosed as familial tremor.   J. She has been sleeping better with Xanax at bedtime. She does drink coffee on some mornings and tea at times.   K. She has lost 24 pounds in weight since her last visit. She is followed at [facility].    L. Alexa [name] remains in counseling with Ms. Showfety every month, most recently this week.   M. She has not had much of a problem with allergies or asthma this year.    N. She has had more hot flashes.   N. She has not been bothered by pains in her anterior shins, but does have occasional calf cramps.    O. 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 She has also done some walking for exercise..    P. She is trying to achieve more balance. She change her work hours to two days per week in late May.    4. Review of Systems:  Constitutional: "My back is still hurting me. My left shoulder still hurts." Marital  relations are going better.   Eyes: Her eyes feel better. Her last eye exam occurred in February or March 2022. She has "the beginnings of diabetes eye disease", but no treatment is needed. Neck: She occasionally has posterior neck and trapezius pains. The patient has no complaints of anterior neck swelling, soreness, tenderness, pressure, or discomfort.  Heart: She has not had chest pain recently. Heart rate increases with exercise or other physical activity. She has no complaints of palpitations or irregular heat beats. She saw Dr. Irish Lack in August 2021. She was told to return if needed. Gastrointestinal: She had a colonoscopy and had a polyp removed. She no longer has nausea. She rarely  has the sensation of stomach pains. She still occasionally has post-prandial bloating if she eats too much. She always has constipation. She occasionally has head hunger and belly hunger. She is not having acid reflux, upset stomach, stomach aches or pains, swallowing difficulties, diarrhea. Her GI doctor told her to take Miralax daily.  Hands: She still has tremors occasionally.  Legs: As above. She still occasionally has leg cramps after working. She has fewer complaints of numbness, tingling, or burning. No edema is noted. Feet: There are no complaints of numbness, tingling, burning, or pain. No edema is noted. Tinea improved with her new laser therapy.   Neuro: No new sensory or muscular problems Psych: She is "struggling".       GU: She has not been urinating a lot. She has not had much nocturia.   GYN: LMP was on 05/11/2010. She had a pap smear in 2020.  Hypoglycemia: She occasionally had low BG symptoms when she was taking 1000 mg of metformin twice daily, but none since reducing the dose to 500 mg twice daily.   5. BG printout: She is not checking BGs.  PAST MEDICAL, FAMILY, AND SOCIAL HISTORY:  Past Medical History:  Diagnosis Date   Anemia, iron deficiency    Anhedonia    Anxiety    Asthma     Asthma, chronic    Back pain    CAD (coronary artery disease)    Cervical spondylosis 06/13/2020   Chewing difficulty    Chronic insomnia    Chronic kidney disease    CKD stage 3 A    Combined hyperlipidemia    Constipation    Depression    Diabetes mellitus    Diabetes mellitus type II    Diabetic autonomic neuropathy (HCC)    Difficulty swallowing pills    DM type 2 with diabetic peripheral neuropathy (HCC)    Dry mouth    Dyspepsia    Dyspnea    Elevated homocysteine    Essential tremor    Fatigue    Fatty liver    GERD (gastroesophageal reflux disease)    GERD (gastroesophageal reflux disease)    Glossitis    Goiter    Hemorrhoids    HLD (hyperlipidemia)    Hyperparathyroidism , secondary, non-renal (HCC)    Hypertension    Hypocalcemia    Insomnia    Leg edema    Leg pain    Macular degeneration    Nonproliferative diabetic retinopathy associated with type 2 diabetes mellitus (HCC)    RESOLVED   Obesity, Class III, BMI 40-49.9 (morbid obesity) (HCC)    Osteopenia    Osteoporosis    Pallor    PONV (postoperative nausea and vomiting)    Status post gastric surgery    Tachycardia    Thyroiditis, autoimmune    Vaginal dryness, menopausal    Vitamin D deficiency     Family History  Problem Relation Age of Onset   Thyroid disease Mother        Hypothyroid   Depression Mother    Liver disease Mother    Obesity Mother    Diabetes Father        T2 DM   Hypertension Father    Hyperlipidemia Father    Heart disease Father    Stroke Father    Kidney Stones Father    Obesity Brother        250 pounds at a height of 76 inches.   Thyroid disease Sister  Hypothyroid   Diabetes Maternal Grandmother    Colon cancer Other        great grandfather   Liver disease Maternal Aunt        NASH   Esophageal cancer Neg Hx    Pancreatic cancer Neg Hx    Kidney disease Neg Hx    Colon polyps Neg Hx    Rectal cancer Neg Hx    Stomach cancer Neg Hx       Current Outpatient Medications:    albuterol (PROVENTIL) (2.5 MG/3ML) 0.083% nebulizer solution, Take 3 mLs (2.5 mg total) by nebulization every 6 (six) hours as needed for wheezing or shortness of breath., Disp: 150 mL, Rfl: 1   albuterol (VENTOLIN HFA) 108 (90 Base) MCG/ACT inhaler, Inhale 2 puffs into the lungs every 6 (six) hours as needed for wheezing or shortness of breath., Disp: 6.7 g, Rfl: 11   ALPRAZolam (XANAX) 0.5 MG tablet, Take 0.5-1 tablets (0.25-0.5 mg total) by mouth 2 (two) times daily as needed for anxiety., Disp: 60 tablet, Rfl: 1   betamethasone valerate ointment (VALISONE) 0.1 %, Apply 1 application topically 2 (two) times daily., Disp: 30 g, Rfl: 1   Blood Glucose Monitoring Suppl (FREESTYLE LITE) DEVI, Use to check glucose 3x daily, Disp: 2 each, Rfl: 5   calcium-vitamin D (OSCAL WITH D) 500-200 MG-UNIT TABS tablet, Take by mouth., Disp: , Rfl:    carvedilol (COREG) 3.125 MG tablet, TAKE 1 TABLET BY MOUTH TWO TIMES DAILY WITH A MEAL, Disp: 120 tablet, Rfl: 10   Cholecalciferol 50 MCG (2000 UT) CAPS, Take 1 capsule by mouth daily., Disp: , Rfl:    conjugated estrogens (PREMARIN) vaginal cream, Place 1 applicatorful vaginally at bedtime for 21 days then off for 7 days., Disp: 30 g, Rfl: 11   Cyanocobalamin (VITAMIN B 12) 100 MCG LOZG, Take by mouth., Disp: , Rfl:    cyclobenzaprine (FLEXERIL) 10 MG tablet, Take 1 tablet (10 mg total) by mouth 3 (three) times daily as needed for muscle spasms., Disp: 90 tablet, Rfl: 0   diphenhydrAMINE (SOMINEX) 25 MG tablet, Take by mouth. , Disp: , Rfl:    Efinaconazole 10 % SOLN, Apply 1 drop topically daily., Disp: 4 mL, Rfl: 11   escitalopram (LEXAPRO) 20 MG tablet, Take 1 tablet (20 mg total) by mouth daily., Disp: 90 tablet, Rfl: 0   Ferrous Sulfate (SLOW FE PO), Take 1 tablet by mouth daily. , Disp: , Rfl:    fluconazole (DIFLUCAN) 150 MG tablet, Take 1 tablet (150 mg total) by mouth once a week., Disp: 4 tablet, Rfl: 2   glucose  blood (FREESTYLE LITE) test strip, Use 1 to check blood glucose 3 times daily., Disp: 300 each, Rfl: 1   ibuprofen (ADVIL,MOTRIN) 200 MG tablet, Take 600 mg by mouth daily as needed for headache or moderate pain., Disp: , Rfl:    levothyroxine (SYNTHROID) 25 MCG tablet, TAKE 1 TABLET BY MOUTH ONCE DAILY, Disp: 90 tablet, Rfl: 1   metFORMIN (GLUCOPHAGE) 1000 MG tablet, TAKE 1 TABLET BY MOUTH 2 TIMES DAILY WITH A MEAL., Disp: 180 tablet, Rfl: 0   Multiple Vitamin (MULTIVITAMIN) tablet, Take 1 tablet by mouth daily. Reported on 02/26/2015, Disp: 30 tablet, Rfl: 0   ondansetron (ZOFRAN) 4 MG tablet, Take 1 tablet (4 mg total) by mouth every 8 (eight) hours as needed for nausea or vomiting., Disp: 20 tablet, Rfl: 0   pantoprazole (PROTONIX) 40 MG tablet, TAKE 1 TABLET (40 MG TOTAL) BY MOUTH DAILY.,  Disp: 90 tablet, Rfl: 4   Semaglutide-Weight Management 2.4 MG/0.75ML SOAJ, Inject 2.4 mg into the skin once a week., Disp: 3 mL, Rfl: 1   telmisartan (MICARDIS) 40 MG tablet, TAKE 1/2 TABLET BY MOUTH TWO TIMES DAILY, Disp: 90 tablet, Rfl: 3   terbinafine (LAMISIL) 250 MG tablet, TAKE 1 TABLET (250 MG TOTAL) BY MOUTH DAILY., Disp: 90 tablet, Rfl: 0   traMADol (ULTRAM) 50 MG tablet, Take 1 tablet (50 mg total) by mouth daily as needed for moderate pain or severe pain., Disp: 30 tablet, Rfl: 1   Vitamin D, Ergocalciferol, (DRISDOL) 50000 units CAPS capsule, Take 50,000 Units by mouth every Sunday., Disp: , Rfl:    Zoster Vaccine Adjuvanted [facility]) injection, Inject 0.5 mLs into the muscle once., Disp: 0.5 mL, Rfl: 1  Allergies as of 10/01/2020 - Review Complete 09/21/2020  Allergen Reaction Noted   Theophyllines Palpitations 05/13/2010    1. Work and Family: She has reduced her workload to two days per week during the week.  2. Activities: She is very busy, but is not doing much exercise 3. Smoking, alcohol, or drugs: None 4. Primary Care Provider: Dr. Tommie Ard Baxley 5. Therapist: Ms. Rea College,  MS 6. Bariatrician: Dr. Redgie Grayer, MD  REVIEW OF SYSTEM: There are no other significant problems involving Alexa [name]'s other body systems.   Objective:  Vital Signs:  BP 128/80 (BP Location: Right Arm, Patient Position: Sitting, Cuff Size: Normal)   Pulse 86   Wt 153 lb (69.4 kg)   LMP 05/11/2010   BMI 27.10 kg/m    Wt Readings from Last 3 Encounters:  10/01/20 153 lb (69.4 kg)  09/19/20 146 lb (66.2 kg)  07/23/20 165 lb (74.8 kg)    Ht Readings from Last 3 Encounters:  09/19/20 5' 3"  (1.6 m)  07/23/20 5' 3"  (1.6 m)  06/27/20 5' 3"  (1.6 m)   Body mass index is 27.1 kg/m. Facility age limit for growth percentiles is 20 years.  Body surface area is 1.76 meters squared.   PHYSICAL EXAM:  Constitutional:  Alexa [name] looks good today. She is wearing a  summer dress.   She has lost 24 pounds since her last visit. Her weight is now much below the post-gastric bypass nadir of 173.4 pounds that it was in 2010. She is alert and bright. Her affect and insight are normal.  Eyes: There is no arcus or proptosis.  Mouth: The oropharynx appears normal. The tongue appears normal. There is normal oral moisture. There is no obvious gingivitis. Neck: There are no bruits present. The thyroid gland appears normal in size. The thyroid gland is again within normal limits at 18-20 grams in size. The left lobe is larger than the right. The consistency of the thyroid gland is normal. There is no thyroid tenderness to palpation. Lungs: The lungs are clear. Air movement is good. Heart: The heart rhythm and rate appear normal. Heart sounds S1 and S2 are normal. I do not appreciate any pathologic heart murmurs. Abdomen: The abdomen is smaller. Bowel sounds are normal. The abdomen is soft and non-tender. There is no obviously palpable hepatomegaly, splenomegaly, or other masses.  Arms: Muscle mass appears appropriate for age.  Hands: She has a 1+ tremor. Phalangeal and metacarpophalangeal joints appear  normal. Palms are normal. Legs: Muscle mass appears appropriate for age. There is no edema. Her shins are not tender to touch today.  Feet: There are no significant deformities. Dorsalis pedis pulses are 1+ on the right and 1+  on the left. She has much less visible tinea in her great toenails.   Neurologic: Muscle strength is normal for age and gender  in both the upper and the lower extremities. Muscle tone appears normal. Sensation to touch is normal in the legs and feet.   LAB DATA:  Labs 10/01/20: HbA1c 7.2%, CBG 131  Labs 07/23/20: CBC normal, except platelets 407 (ref 140-400); B. Burgdorff antibodies ,IgM reactive for Lyme disease; RMSF antibodies not detected  Labs 05/16/20: HbA1c 7.5%; TSH 2.09: CMP normal, except glucose 135 and GFR 59; CBC normal; cholesterol 210, triglycerides 84, HDL 65, LDL 145;  Folate 11.9 (ref >5.4); ESR 6 (ref 0-30); Rheumatoid factor <62; cyclic citrulline peptide antibody <16; vitamin B12 1007 (ref [phone]); Urinary microalbumin/creatinine ratio 7; anti-Smith antibody <1; anti-DNA antibody <1; anti-nuclear antibody positive 1:40  Labs 12/11/19: HbA1c 8.5%  Labs 09/08/19: HbA1c 7.2%, CBG 98  Labs 09/04/19: PTH 40, calcium 8.9, 25-OH vitamin D 42  Labs 06/02/19: HbA1c 7.7%, CBG 138  Labs 02/02/19: HbA1c 7.7%, CBG 101  Labs 01/25/19: TSH 1.87, free T4 1.3, free T3 2.6; C-peptide 1.27 (ref 0.80-3.85); CMP normal except creatinine 1.25; cholesterol 169, triglycerides 81, HDL 65, LDL 87; PTH 42 (ref 14-64), calcium 9.4 (ref 8.6-10.4); urinary microalbumin/creatinine ratio 9 (ref <30)   Labs 09/08/18: HbA1c 8.2%, CBG 123  Labs 03/24/18: HbA1c 7.7%, CBG 103  Labs 01/03/18: CBG 112 fasting  Lbs 12/02/17: sodium 134, glucose 322, creatinine 1.17  labs 11/16/17: Urine microalbumin/creatinine ratio 4.8; B12 282 (ref [phone]), folate 12.7 (ref >3.0)  Labs 11/01/17: HbA1c 6.9%, CBG 189 post-prandially; PTH 108, calcium 8.6, 25-OH vitamin D 64   Labs 09/13/17:  Hepatic function panel normal; CBC normal  Labs 08/09/17: Hepatic function panel normal; CBC normal, except Hct 34.7 (35-45)  Labs 06/14/17: CBG 166  Labs 06/08/17: TSH 1.80, free T4 1.3, free T3 2.3; CMP normal except for glucose 115 and creatinine 1.27; PTH 120, calcium 9.7, 25-OH vitamin D 34;cholesterol 188, triglycerides 110, HDL 67, LDL 100; urine microalbumin/creatinine ratio   Labs 04/12/17: HbA1c 10.0%, CBG 174  Labs 03/04/17: Sodium 131, potassium 4.0, chloride 99, CO2 20, glucose 515, calcium 8.9  Labs 01/11/17: CBG 198  Labs 11/24/16: CBG 468; urine glucose 2000+, negative ketones  Labs 11/17/16: HbA1c 13.4%; TSH 1.54; cholesterol 173, triglycerides 91, HDL 71, LDL 83; 25-OH vitamin D 21; CMP normal except for glucose 314; CBC normal except MCHC 26.5 (ref 27-33)   06/04/16: HBA1c 8.1%, CBG 115  05/27/16: TSH 2.62, free T4 1.1, free T3 2.3; CMP normal except for glucose 161, and creatinine 1.09; cholesterol 187, triglycerides 77, HDL 74, LDL 98; urinary microalbumin/creatinine ratio 6; PTH 112, calcium 9.2, 25-OH vitamin D 33  03/17/16: CBG 145  Labs 12/31/15: HbA1c 13.4%, CBG 390; CMP normal except for glucose 344 and creatinine 1.14  Labs 10/08/15: HbA1c 9.6%  Labs 07/02/15: HbA1c 8.9%; PTH 107, calcium 9.2, 25-OH vitamin D 33; CBC normal; CMP normal except for creatinine 1.07; cholesterol 174, triglycerides 91, HDL 74, LDL 82; TSH 2.43, free T4 1.2, free T3 2.3  Labs 02/19/15: PTH 99, calcium 8.7, 25-OH vitamin D 20  Labs 10/08/14: HbA1c 8.0%   Labs 08/06/14: HbA1c 7.4%  Labs 08/01/14: TSH 2.014, free T4 0.87, free T3 2.4; PTH 79 (normal 14-64), calcium 9.1; 25-OH vitamin D 35 CMP normal  Labs 05/11/14: HbA1c was 7.7%, without any low BGs; TSH 2.416, free T4 1.13, free T3 2.3  Labs 02/22/14: HbA1c 11.6%  Labs 02/13/14: Calcium 9.0, PTH  131, 25-OH vitamin D 15; CMP normal except for glucose of 165; cholesterol 177, triglycerides 108, HDL 90, LDL 65; urinary  microalbumin/creatinine ratio 15.6; TSH 3.639, free T4 1.30, free T3 2.5  Labs 10/10/13: HbA1c is 9.4%, compared with 8.1% at last visit and with 9.3% in December;   Labs 05/08/13: HbA1c 8.1%; PTH 90.2, calcium 9.5, 25-hydroxy vitamin D 46; CMP normal except for glucose of 215; WBC 8.0, RBC 5.20, Hgb 12.5, Hct 37.8%, MCV 72.7 (normal 78-100), iron 55  Labs 02/23/13: CMP normal, except glucose 194 and alkaline phosphatase 126; microalbumin/creatinine ratio was 11.5; TSH 2.086, free T4 1.29, free T3 3.2, TPO antibody < 10; 25-hydroxy vitamin D 18; C-peptide 1.79; cholesterol 156, triglycerides 64, HDL 61, LDL 82  Labs 01/05/13: Hemoglobin A1c was 9.3%, compared with 9.3 at last visit and with 9.1% at the visit prior. All of these values were major increases from 6.6% in May 2012.    Labs 04/14/12: 25-hydroxy vitamin D 21, iron 203,  Vitamin B12 379,   Labs 11/16/11: Hgb 10.3, Hct 32.4%; CMP normal except glucose 146' cholesterol 201, triglycerides 70, HDL 66, LDL 121; vitamin D 22           Labs 06/09/10: Cholesterol was 175, triglycerides 76, HDL 62, LDL 98. TSH was 1.217. Free T4 was 1.10. Free T3 was 2.7. 25-hydroxy vitamin D was 19. PTH was 167.  IMAGING:  BMD 09/08/19: pending   Assessment and Plan:   ASSESSMENT:  1. Type 2 diabetes mellitus:   A. During the past three years her T2DM control has waxed and waned, with HbA1c values varying from 6.9-13.4%.    B. At her August 2021 visit her HbA1c had decreased to 7.2%. Unfortunately, her HbA1c increased to 8.5% in November 2021. Today, however, the HbA1c has decreased to 7.2%.   C. At today's visit she has lost 24 pounds.    D. My goal is to reduce her HbA1c to <6.5% if we can safely do so.  E. If she will just take her medications and exercise, she can probably.  reach that goal.  2. Hypoglycemia: She has not had any recognized hypoglycemia for many months.  3. Obesity: Her weight has decreased 24 pounds since her last visit. She needs to  take her medications, continue to Eat Right and exercise daily.  4-5. Autonomic neuropathy and tachycardia: The patient's autonomic neuropathy and  heart rate improved as her BGs decreased, but worsened as her BGs increased. She is doing well today, but will worsen again if she does not improve her  BGs.     6. Hypertension: Her SBP is normal today, but her DBP is slightly elevated. She still needs to try to fit in more exercise and take her medications.  7-8. Anhedonia and anxiety: She is doing well. She still needs to call for help when she begins to do poorly. She also needs to continue to find ways to have more joy in her life. 9-12. GERD/esophagitis/difficulty swallowing/gastroparesis: Her reflux, other GI symptoms, and difficulty swallowing have all improved.   13-15: Hypocalcemia, vitamin D deficiency, and secondary hyperparathyroidism:   A. Due to her bariatric surgery she has more difficulty absorbing vitamin D and calcium. When she develops hypocalcemia and vitamin D deficiency, her parathyroid glands appropriately produce more PTH, resulting in secondary hyperparathyroidism. However, if she takes her calcium and vitamin D all three parameters will normalize.   B. Her calcium was normal in October 2018, but at the lower limit of normal  in February 2019. Her vitamin D level was low-normal in May 2019.   C. In May 2019 her PTH was elevated to 120 and her calcium was 9.7. She still had secondary hyperparathyroidism. In October 2019 her calcium was at the low end of the reference range and her PTH was still elevated, but lower.  D. Her PTH level in August 2021 was mid-normal. Her calcium was at the low end of the reference range, because she was taking less calcium than she had intended. Her vitamin D level was good. She needs to increase her calcium intake to 600 mg, three times daily and take her vitamin D and calcium very consistently.   E. Since her last visit she stopped taking calcium again.  She needs to resume taking the calcium. Two Tums per day will probably be adequate.  33. Noncompliance: Since last visit her compliance with eating right and taking her medications has varied, but has generally been worse.   17: Muscle cramps: Her cramps occur more infrequently now.  18. Hypothyroidism: Her TFTs in May 2019 and in December 2020 were normal. Her TSH in April 2022 was normal. Her current levothyroxine dosage is working well if she takes it. 19. Sleeping difficulties: These problems may have worsened. I suspect that her increased caffeine intake is causing much of these problems.  20. Osteopenia/osteoporosis: Alexa [name]'s secondary hyperparathyroidism had normalized. She had a new BMD study in August 2021. Her osteoporosis was somewhat worse. She needs to take her calcium and vitamin D.   PLAN:  1. Diagnostic: I reviewed her lab results from April and June 2022.    2. Therapeutic: Continue medications as currently prescribed, to include metformin doses of 500 mg in the mornings and 500 mg in the evenings. Continue Synthroid at bedtime. Take 600 mg of calcium at mealtimes. Take [employer], 50,000 IU per week to improve her vitamin D level. Try to fit in exercise daily.  See Ms. Showfety in follow up.    3. Patient education: We discussed all of the above at great length. She must continue to take her medications and supplements. She also needs to see me every two-three months. When she knows that she is coming to see "[facility]", her compliance sometimes improves.  4. Follow-up:  3 months   Level of Service: This visit lasted in excess of 50 minutes. More than 50% of the visit was devoted to counseling.   Tillman Sers, MD, CDE Adult and Pediatric Endocrinology 10/01/2020 3:24 PM

## 2020-10-01 ENCOUNTER — Other Ambulatory Visit (HOSPITAL_COMMUNITY): Payer: Self-pay

## 2020-10-01 ENCOUNTER — Other Ambulatory Visit: Payer: Self-pay

## 2020-10-01 ENCOUNTER — Encounter: Payer: Self-pay | Admitting: Internal Medicine

## 2020-10-01 ENCOUNTER — Encounter (INDEPENDENT_AMBULATORY_CARE_PROVIDER_SITE_OTHER): Payer: Self-pay | Admitting: "Endocrinology

## 2020-10-01 ENCOUNTER — Ambulatory Visit (INDEPENDENT_AMBULATORY_CARE_PROVIDER_SITE_OTHER): Payer: 59 | Admitting: "Endocrinology

## 2020-10-01 ENCOUNTER — Ambulatory Visit: Payer: 59 | Admitting: Internal Medicine

## 2020-10-01 VITALS — BP 128/80 | HR 86 | Wt 153.0 lb

## 2020-10-01 VITALS — BP 138/90 | Ht 63.0 in | Wt 152.0 lb

## 2020-10-01 DIAGNOSIS — E1140 Type 2 diabetes mellitus with diabetic neuropathy, unspecified: Secondary | ICD-10-CM

## 2020-10-01 DIAGNOSIS — F439 Reaction to severe stress, unspecified: Secondary | ICD-10-CM | POA: Diagnosis not present

## 2020-10-01 DIAGNOSIS — F418 Other specified anxiety disorders: Secondary | ICD-10-CM | POA: Diagnosis not present

## 2020-10-01 DIAGNOSIS — E211 Secondary hyperparathyroidism, not elsewhere classified: Secondary | ICD-10-CM | POA: Diagnosis not present

## 2020-10-01 DIAGNOSIS — E11649 Type 2 diabetes mellitus with hypoglycemia without coma: Secondary | ICD-10-CM | POA: Diagnosis not present

## 2020-10-01 DIAGNOSIS — E063 Autoimmune thyroiditis: Secondary | ICD-10-CM

## 2020-10-01 DIAGNOSIS — E049 Nontoxic goiter, unspecified: Secondary | ICD-10-CM | POA: Diagnosis not present

## 2020-10-01 DIAGNOSIS — E559 Vitamin D deficiency, unspecified: Secondary | ICD-10-CM

## 2020-10-01 DIAGNOSIS — M7502 Adhesive capsulitis of left shoulder: Secondary | ICD-10-CM | POA: Diagnosis not present

## 2020-10-01 DIAGNOSIS — M62522 Muscle wasting and atrophy, not elsewhere classified, left upper arm: Secondary | ICD-10-CM | POA: Diagnosis not present

## 2020-10-01 DIAGNOSIS — M25512 Pain in left shoulder: Secondary | ICD-10-CM | POA: Diagnosis not present

## 2020-10-01 LAB — POCT GLUCOSE (DEVICE FOR HOME USE): Glucose Fasting, POC: 131 mg/dL — AB (ref 70–99)

## 2020-10-01 LAB — POCT GLYCOSYLATED HEMOGLOBIN (HGB A1C): Hemoglobin A1C: 7.6 % — AB (ref 4.0–5.6)

## 2020-10-01 MED ORDER — ALPRAZOLAM 0.5 MG PO TABS
0.2500 mg | ORAL_TABLET | Freq: Two times a day (BID) | ORAL | 1 refills | Status: DC | PRN
  Filled 2020-10-01: qty 60, 30d supply, fill #0
  Filled 2020-10-29: qty 60, 30d supply, fill #1

## 2020-10-01 MED ORDER — CYCLOBENZAPRINE HCL 10 MG PO TABS
10.0000 mg | ORAL_TABLET | Freq: Three times a day (TID) | ORAL | 0 refills | Status: DC | PRN
  Filled 2020-10-01: qty 90, 30d supply, fill #0

## 2020-10-01 MED ORDER — ESCITALOPRAM OXALATE 20 MG PO TABS
20.0000 mg | ORAL_TABLET | Freq: Every day | ORAL | 2 refills | Status: DC
  Filled 2020-10-01: qty 90, 90d supply, fill #0

## 2020-10-01 NOTE — Patient Instructions (Signed)
Follow up visit in 3 months. 

## 2020-10-01 NOTE — Progress Notes (Signed)
   Subjective:    Patient ID: Alexa [name], female    DOB: 1960/04/03, 60 y.o.   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 Work is very hard right now.Is being seen at [facility].  History of diabetes mellitus.    Has had considerable issues with adhesive capsulitis of left shoulder and has been attending physical therapy for that.  She saw Dr. Tamera Punt in early July and received an injection.  Just saw Dr. Tobe Sos today and hemoglobin A1c is 7.6%.  Review of Systems more insomnia recently.  Feeling down.  Has counselor that she sees.     Objective:   Physical Exam Blood pressure 138/90 weight 152 pounds height 5 feet 3 inches BMI 26.93  Decreased range of motion left upper extremity  Affect is a bit flat today.        Assessment & Plan:  Depression-continue Lexapro 20 mg daily and continue counseling  Left shoulder arthropathy-continue physical therapy.  Still has limited range of motion in left glenohumeral joint and some pain with raising left arm  Situational stress at work.  Work is hard and no department is shorthanded  Insomnia-continue Xanax as needed.  This was refilled.  Plan: I am pleased she is attending [facility].  She has lost some weight.  She has a history of bariatric surgery.  She will return here in May for health maintenance exam.  Refill Flexeril for musculoskeletal pain.  Refilled her Lexapro and Xanax.  Continue counseling.

## 2020-10-03 DIAGNOSIS — F39 Unspecified mood [affective] disorder: Secondary | ICD-10-CM | POA: Diagnosis not present

## 2020-10-04 DIAGNOSIS — M25512 Pain in left shoulder: Secondary | ICD-10-CM | POA: Diagnosis not present

## 2020-10-04 DIAGNOSIS — M62522 Muscle wasting and atrophy, not elsewhere classified, left upper arm: Secondary | ICD-10-CM | POA: Diagnosis not present

## 2020-10-04 DIAGNOSIS — M7502 Adhesive capsulitis of left shoulder: Secondary | ICD-10-CM | POA: Diagnosis not present

## 2020-10-07 ENCOUNTER — Other Ambulatory Visit (HOSPITAL_COMMUNITY): Payer: Self-pay

## 2020-10-07 NOTE — Telephone Encounter (Signed)
error 

## 2020-10-09 DIAGNOSIS — M25512 Pain in left shoulder: Secondary | ICD-10-CM | POA: Diagnosis not present

## 2020-10-09 DIAGNOSIS — M62522 Muscle wasting and atrophy, not elsewhere classified, left upper arm: Secondary | ICD-10-CM | POA: Diagnosis not present

## 2020-10-09 DIAGNOSIS — M7502 Adhesive capsulitis of left shoulder: Secondary | ICD-10-CM | POA: Diagnosis not present

## 2020-10-10 ENCOUNTER — Other Ambulatory Visit (HOSPITAL_COMMUNITY): Payer: Self-pay

## 2020-10-11 DIAGNOSIS — M25512 Pain in left shoulder: Secondary | ICD-10-CM | POA: Diagnosis not present

## 2020-10-11 DIAGNOSIS — M62522 Muscle wasting and atrophy, not elsewhere classified, left upper arm: Secondary | ICD-10-CM | POA: Diagnosis not present

## 2020-10-11 DIAGNOSIS — M7502 Adhesive capsulitis of left shoulder: Secondary | ICD-10-CM | POA: Diagnosis not present

## 2020-10-13 NOTE — Patient Instructions (Addendum)
Lexapro and Xanax have been refilled.  Continue physical therapy for left shoulder arthropathy.  Continue counseling for situational stress.  Physical exam booked for May 2023.  Refilled Flexeril for musculoskeletal pain.

## 2020-10-15 ENCOUNTER — Other Ambulatory Visit (HOSPITAL_COMMUNITY): Payer: Self-pay

## 2020-10-17 ENCOUNTER — Other Ambulatory Visit (HOSPITAL_COMMUNITY): Payer: Self-pay

## 2020-10-17 DIAGNOSIS — M7502 Adhesive capsulitis of left shoulder: Secondary | ICD-10-CM | POA: Diagnosis not present

## 2020-10-17 DIAGNOSIS — M25512 Pain in left shoulder: Secondary | ICD-10-CM | POA: Diagnosis not present

## 2020-10-17 DIAGNOSIS — M62522 Muscle wasting and atrophy, not elsewhere classified, left upper arm: Secondary | ICD-10-CM | POA: Diagnosis not present

## 2020-10-21 ENCOUNTER — Other Ambulatory Visit (HOSPITAL_COMMUNITY): Payer: Self-pay

## 2020-10-22 ENCOUNTER — Other Ambulatory Visit: Payer: Self-pay

## 2020-10-22 ENCOUNTER — Other Ambulatory Visit (HOSPITAL_COMMUNITY): Payer: Self-pay

## 2020-10-22 ENCOUNTER — Ambulatory Visit (INDEPENDENT_AMBULATORY_CARE_PROVIDER_SITE_OTHER): Payer: 59 | Admitting: Family Medicine

## 2020-10-22 ENCOUNTER — Encounter (INDEPENDENT_AMBULATORY_CARE_PROVIDER_SITE_OTHER): Payer: Self-pay | Admitting: Family Medicine

## 2020-10-22 VITALS — BP 126/85 | HR 89 | Temp 97.5°F | Ht 63.0 in | Wt 148.0 lb

## 2020-10-22 DIAGNOSIS — N1831 Chronic kidney disease, stage 3a: Secondary | ICD-10-CM | POA: Diagnosis not present

## 2020-10-22 DIAGNOSIS — E1122 Type 2 diabetes mellitus with diabetic chronic kidney disease: Secondary | ICD-10-CM

## 2020-10-22 DIAGNOSIS — Z6829 Body mass index (BMI) 29.0-29.9, adult: Secondary | ICD-10-CM

## 2020-10-22 DIAGNOSIS — E663 Overweight: Secondary | ICD-10-CM | POA: Diagnosis not present

## 2020-10-22 DIAGNOSIS — M62522 Muscle wasting and atrophy, not elsewhere classified, left upper arm: Secondary | ICD-10-CM | POA: Diagnosis not present

## 2020-10-22 DIAGNOSIS — M25512 Pain in left shoulder: Secondary | ICD-10-CM | POA: Diagnosis not present

## 2020-10-22 DIAGNOSIS — M7502 Adhesive capsulitis of left shoulder: Secondary | ICD-10-CM | POA: Diagnosis not present

## 2020-10-22 MED ORDER — SEMAGLUTIDE-WEIGHT MANAGEMENT 2.4 MG/0.75ML ~~LOC~~ SOAJ
2.4000 mg | SUBCUTANEOUS | 1 refills | Status: DC
  Filled 2020-10-22: qty 3, 28d supply, fill #0
  Filled 2020-11-20: qty 3, 28d supply, fill #1

## 2020-10-22 NOTE — Progress Notes (Signed)
Chief Complaint:   OBESITY Alexa [name] is here to discuss her progress with her obesity treatment plan along with follow-up of her obesity related diagnoses. Alexa [name] is on practicing portion control and making smarter food choices, such as increasing vegetables and decreasing simple carbohydrates and states she is following her eating plan approximately 75% of the time. Alexa [name] states she is walking bands for 45-60 minutes 3 times per week.  Today's visit was #: 70 Starting weight: 169 lbs Starting date: 11/17/2019 Today's weight: 148 lbs Today's date: 10/22/2020 Total lbs lost to date: 21 Total lbs lost since last in-office visit: 0  Interim History: Alexa [name]'s last visit was approximately 1 month ago. She has done well overall, but she is up just a bit this month. She is doing well on Wegovy and requests a refill.  Subjective:   1. Type 2 diabetes mellitus with stage 3a chronic kidney disease, without long-term current use of insulin (HCC) Alexa [name]'s last A1c was 7.6 earlier this month, and is not yet at goal but is better than the 8.5 she had approximately 1 year ago. She is on metformin and Semaglutide, and no hypoglycemia was noted.  Assessment/Plan:   1. Type 2 diabetes mellitus with stage 3a chronic kidney disease, without long-term current use of insulin ([place]) Shanieka will continue diet, exercise, and her medications, and will continue to follow up as directed. Good blood sugar control is important to decrease the likelihood of diabetic complications such as nephropathy, neuropathy, limb loss, blindness, coronary artery disease, and death. Intensive lifestyle modification including diet, exercise and weight loss are the first line of treatment for diabetes.   2. Obesity with Current BMI of  26.2 Alexa [name] is currently in the action stage of change. As such, her goal is to continue with weight loss efforts. She has agreed to practicing portion control and making smarter food choices, such as  increasing vegetables and decreasing simple carbohydrates.   We discussed various medication options to help Alexa [name] with her weight loss efforts and we both agreed to continue [facility] and we will refill for 2 months.  - Semaglutide-Weight Management 2.4 MG/0.75ML SOAJ; Inject 2.4 mg into the skin once a week.  Dispense: 3 mL; Refill: 1  Exercise goals: As is.  Behavioral modification strategies: increasing lean protein intake.  Alexa [name] has agreed to follow-up with our clinic in 7 to 8 weeks. She was informed of the importance of frequent follow-up visits to maximize her success with intensive lifestyle modifications for her multiple health conditions.   Objective:   Blood pressure 126/85, pulse 89, temperature (!) 97.5 F (36.4 C), height 5\' 3"  (1.6 m), weight 148 lb (67.1 kg), last menstrual period 05/11/2010, SpO2 99 %. Body mass index is 26.22 kg/m.  General: Cooperative, alert, well developed, in no acute distress. HEENT: Conjunctivae and lids unremarkable. Cardiovascular: Regular rhythm.  Lungs: Normal work of breathing. Neurologic: No focal deficits.   Lab Results  Component Value Date   CREATININE 1.03 05/16/2020   BUN 15 05/16/2020   NA 138 05/16/2020   K 5.0 05/16/2020   CL 104 05/16/2020   CO2 23 05/16/2020   Lab Results  Component Value Date   ALT 11 05/16/2020   AST 12 05/16/2020   ALKPHOS 85 05/27/2016   BILITOT 0.5 05/16/2020   Lab Results  Component Value Date   HGBA1C 7.6 (A) 10/01/2020   HGBA1C 7.5 (H) 05/16/2020   HGBA1C 8.5 (A) 12/11/2019   HGBA1C 7.2 (A) 09/08/2019  HGBA1C 7.7 (A) 06/02/2019   Lab Results  Component Value Date   INSULIN 8.1 11/16/2017   Lab Results  Component Value Date   TSH 2.09 05/16/2020   Lab Results  Component Value Date   CHOL 210 (H) 05/16/2020   HDL 65 05/16/2020   LDLCALC 127 (H) 05/16/2020   TRIG 84 05/16/2020   CHOLHDL 3.2 05/16/2020   Lab Results  Component Value Date   VD25OH 71 03/27/2019   VD25OH  64 11/01/2017   VD25OH 34 06/08/2017   Lab Results  Component Value Date   WBC 7.4 07/23/2020   HGB 12.9 07/23/2020   HCT 40.0 07/23/2020   MCV 85.3 07/23/2020   PLT 407 (H) 07/23/2020   Lab Results  Component Value Date   IRON 55 05/08/2013   TIBC 357 04/14/2012   Attestation Statements:   Reviewed by clinician on day of visit: allergies, medications, problem list, medical history, surgical history, family history, social history, and previous encounter notes.  Time spent on visit including pre-visit chart review and post-visit care and charting was 42 minutes.    I, Trixie Dredge, am acting as transcriptionist for Dennard Nip, MD.  I have reviewed the above documentation for accuracy and completeness, and I agree with the above. -  Dennard Nip, MD

## 2020-10-24 ENCOUNTER — Encounter: Payer: Self-pay | Admitting: Internal Medicine

## 2020-10-25 ENCOUNTER — Other Ambulatory Visit: Payer: Self-pay

## 2020-10-25 ENCOUNTER — Other Ambulatory Visit: Payer: 59

## 2020-10-25 ENCOUNTER — Ambulatory Visit (INDEPENDENT_AMBULATORY_CARE_PROVIDER_SITE_OTHER): Payer: Self-pay | Admitting: *Deleted

## 2020-10-25 DIAGNOSIS — B351 Tinea unguium: Secondary | ICD-10-CM

## 2020-10-25 NOTE — Progress Notes (Signed)
Patient presents today for the 2nd of 2 laser treatments. Diagnosed with mycotic nail infection by Dr. Jacqualyn Posey.   Toenail most affected are hallux bilateral.  All other systems are negative.  Nails were filed thin. Laser therapy was administered to 1st toenails bilateral and patient tolerated the treatment well. All safety precautions were in place.   Patient has had laser treatments in the past. She's had some discoloration starting on the nails and is concerned it will get worse if not taken care of now. Dr. Jacqualyn Posey has her on fluconazole one pill weekly for the next 2 months.   I also offered to set her up on laser maintenance once these 2 treatments have been completed. She was interested in this for preventative.   Follow up in 6 weeks with Dr. Jacqualyn Posey for fluconazole, then possibly start laser maintenance if she chooses.    These 2 laser treatments are complimentary per Dr. Jacqualyn Posey.

## 2020-10-29 ENCOUNTER — Other Ambulatory Visit (HOSPITAL_COMMUNITY): Payer: Self-pay

## 2020-10-29 DIAGNOSIS — M7502 Adhesive capsulitis of left shoulder: Secondary | ICD-10-CM | POA: Diagnosis not present

## 2020-10-29 DIAGNOSIS — M25512 Pain in left shoulder: Secondary | ICD-10-CM | POA: Diagnosis not present

## 2020-10-29 DIAGNOSIS — M62522 Muscle wasting and atrophy, not elsewhere classified, left upper arm: Secondary | ICD-10-CM | POA: Diagnosis not present

## 2020-11-04 DIAGNOSIS — M25512 Pain in left shoulder: Secondary | ICD-10-CM | POA: Diagnosis not present

## 2020-11-04 DIAGNOSIS — M7502 Adhesive capsulitis of left shoulder: Secondary | ICD-10-CM | POA: Diagnosis not present

## 2020-11-04 DIAGNOSIS — M62522 Muscle wasting and atrophy, not elsewhere classified, left upper arm: Secondary | ICD-10-CM | POA: Diagnosis not present

## 2020-11-05 ENCOUNTER — Encounter: Payer: Self-pay | Admitting: Gastroenterology

## 2020-11-13 DIAGNOSIS — M25512 Pain in left shoulder: Secondary | ICD-10-CM | POA: Diagnosis not present

## 2020-11-13 DIAGNOSIS — M62522 Muscle wasting and atrophy, not elsewhere classified, left upper arm: Secondary | ICD-10-CM | POA: Diagnosis not present

## 2020-11-13 DIAGNOSIS — M7502 Adhesive capsulitis of left shoulder: Secondary | ICD-10-CM | POA: Diagnosis not present

## 2020-11-21 ENCOUNTER — Other Ambulatory Visit (HOSPITAL_COMMUNITY): Payer: Self-pay

## 2020-11-26 ENCOUNTER — Encounter: Payer: Self-pay | Admitting: Gastroenterology

## 2020-11-27 ENCOUNTER — Telehealth: Payer: Self-pay | Admitting: Internal Medicine

## 2020-11-27 ENCOUNTER — Other Ambulatory Visit (HOSPITAL_COMMUNITY): Payer: Self-pay

## 2020-11-27 ENCOUNTER — Other Ambulatory Visit: Payer: Self-pay | Admitting: Internal Medicine

## 2020-11-27 DIAGNOSIS — M7502 Adhesive capsulitis of left shoulder: Secondary | ICD-10-CM | POA: Diagnosis not present

## 2020-11-27 DIAGNOSIS — M62522 Muscle wasting and atrophy, not elsewhere classified, left upper arm: Secondary | ICD-10-CM | POA: Diagnosis not present

## 2020-11-27 DIAGNOSIS — M25512 Pain in left shoulder: Secondary | ICD-10-CM | POA: Diagnosis not present

## 2020-11-27 MED ORDER — ALPRAZOLAM 0.5 MG PO TABS
0.2500 mg | ORAL_TABLET | Freq: Two times a day (BID) | ORAL | 0 refills | Status: DC | PRN
  Filled 2020-11-27 – 2020-11-28 (×2): qty 60, 30d supply, fill #0

## 2020-11-27 NOTE — Telephone Encounter (Signed)
Aaralyn Kil [phone]  Alexa [name] called to say she has not been sleeping very good so she has been taking some of her Xanax to help with that. 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 I let her know she would need an appointment to discuss any medication that she has not been prescribed before.

## 2020-11-27 NOTE — Telephone Encounter (Signed)
scheduled

## 2020-11-28 ENCOUNTER — Ambulatory Visit: Payer: 59 | Admitting: Internal Medicine

## 2020-11-28 ENCOUNTER — Other Ambulatory Visit: Payer: Self-pay

## 2020-11-28 ENCOUNTER — Other Ambulatory Visit (HOSPITAL_COMMUNITY): Payer: Self-pay

## 2020-11-28 ENCOUNTER — Encounter: Payer: Self-pay | Admitting: Internal Medicine

## 2020-11-28 VITALS — BP 118/82 | HR 85 | Temp 98.9°F | Ht 63.0 in | Wt 146.0 lb

## 2020-11-28 DIAGNOSIS — N1831 Chronic kidney disease, stage 3a: Secondary | ICD-10-CM | POA: Diagnosis not present

## 2020-11-28 DIAGNOSIS — F439 Reaction to severe stress, unspecified: Secondary | ICD-10-CM | POA: Diagnosis not present

## 2020-11-28 DIAGNOSIS — F5102 Adjustment insomnia: Secondary | ICD-10-CM | POA: Diagnosis not present

## 2020-11-28 DIAGNOSIS — E1122 Type 2 diabetes mellitus with diabetic chronic kidney disease: Secondary | ICD-10-CM | POA: Diagnosis not present

## 2020-11-28 DIAGNOSIS — M7502 Adhesive capsulitis of left shoulder: Secondary | ICD-10-CM
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
## 2020-12-02 DIAGNOSIS — M25512 Pain in left shoulder: Secondary | ICD-10-CM | POA: Diagnosis not present

## 2020-12-02 DIAGNOSIS — M62522 Muscle wasting and atrophy, not elsewhere classified, left upper arm: Secondary | ICD-10-CM | POA: Diagnosis not present

## 2020-12-02 DIAGNOSIS — M7502 Adhesive capsulitis of left shoulder: Secondary | ICD-10-CM | POA: Diagnosis not present

## 2020-12-09 ENCOUNTER — Other Ambulatory Visit (HOSPITAL_COMMUNITY): Payer: Self-pay

## 2020-12-09 ENCOUNTER — Telehealth: Payer: Self-pay | Admitting: Internal Medicine

## 2020-12-09 DIAGNOSIS — F439 Reaction to severe stress, unspecified: Secondary | ICD-10-CM

## 2020-12-09 MED ORDER — ALPRAZOLAM 2 MG PO TABS
2.0000 mg | ORAL_TABLET | Freq: Every evening | ORAL | 2 refills | Status: DC | PRN
  Filled 2020-12-09: qty 30, 30d supply, fill #0
  Filled 2021-01-07: qty 30, 30d supply, fill #1
  Filled 2021-02-04: qty 30, 30d supply, fill #2

## 2020-12-09 NOTE — Telephone Encounter (Signed)
Alexa [name] said when she was here a couple weeks ago you were going to send in a prescription for her of below medication.  ALPRAZolam Duanne Moron) 2 mg at bedtime  [name] Phone:  [phone]  Fax:  [phone]

## 2020-12-09 NOTE — Telephone Encounter (Signed)
Sent in Xanax 2 mg tabs today as requested for sleep

## 2020-12-09 NOTE — Telephone Encounter (Signed)
Sent in Xanax 2 mg tabs for sleep as discussed at recent OV

## 2020-12-10 ENCOUNTER — Ambulatory Visit (INDEPENDENT_AMBULATORY_CARE_PROVIDER_SITE_OTHER): Payer: 59 | Admitting: Family Medicine

## 2020-12-11 ENCOUNTER — Other Ambulatory Visit: Payer: Self-pay

## 2020-12-11 ENCOUNTER — Ambulatory Visit (INDEPENDENT_AMBULATORY_CARE_PROVIDER_SITE_OTHER): Payer: 59 | Admitting: Family Medicine

## 2020-12-11 ENCOUNTER — Other Ambulatory Visit (HOSPITAL_COMMUNITY): Payer: Self-pay

## 2020-12-11 ENCOUNTER — Encounter (INDEPENDENT_AMBULATORY_CARE_PROVIDER_SITE_OTHER): Payer: Self-pay | Admitting: Family Medicine

## 2020-12-11 VITALS — BP 125/79 | HR 81 | Temp 97.7°F | Ht 63.0 in | Wt 145.0 lb

## 2020-12-11 DIAGNOSIS — E663 Overweight: Secondary | ICD-10-CM

## 2020-12-11 DIAGNOSIS — F418 Other specified anxiety disorders: Secondary | ICD-10-CM | POA: Diagnosis not present

## 2020-12-11 DIAGNOSIS — Z6829 Body mass index (BMI) 29.0-29.9, adult: Secondary | ICD-10-CM | POA: Diagnosis not present

## 2020-12-11 DIAGNOSIS — N1831 Chronic kidney disease, stage 3a: Secondary | ICD-10-CM

## 2020-12-11 DIAGNOSIS — E1122 Type 2 diabetes mellitus with diabetic chronic kidney disease: Secondary | ICD-10-CM | POA: Diagnosis not present

## 2020-12-11 MED ORDER — SEMAGLUTIDE-WEIGHT MANAGEMENT 2.4 MG/0.75ML ~~LOC~~ SOAJ
2.4000 mg | SUBCUTANEOUS | 1 refills | Status: DC
  Filled 2020-12-11: qty 3, 28d supply, fill #0
  Filled 2020-12-25: qty 3, 28d supply, fill #1

## 2020-12-11 NOTE — Progress Notes (Signed)
Chief Complaint:   OBESITY Alexa [name] is here to discuss her progress with her obesity treatment plan along with follow-up of her obesity related diagnoses. Alexa [name] is on practicing portion control and making smarter food choices, such as increasing vegetables and decreasing simple carbohydrates plus 85 grams of protein and states she is following her eating plan approximately 75% of the time. Alexa [name] states she is doing 0 minutes 0 times per week.  Today's visit was #: 28 Starting weight: 169 lbs Starting date: 11/17/2019 Today's weight: 145 lbs Today's date: 12/11/2020 Total lbs lost to date: 24 lbs Total lbs lost since last in-office visit: 3 lbs  Interim History: Alexa [name] is happy with her current weight. She notes simple carb intake has increased bit is overall still an occasional occurrence  She he is working on protein intake but does not hit the 80-85 grams per day. She eats only 2 meals per day. She does not like dairy  or eggs which makes it more difficult to meet her protein goals.  Subjective:   1. Type 2 diabetes mellitus with stage 3a chronic kidney disease, without long-term current use of insulin (HCC) Alexa [name]'s last A1C level was 7.2 on 10/01/2020. Dr. Loren Racer goal for her A1c is < 6.5. Her CBGs run 130 in the AM. and 180-200 in the evenings. She is on Metformin 500 twice daily and Wegovy 2.4 mg weekly. She has not been compliant with Metformin recently.  Lab Results  Component Value Date   HGBA1C 7.6 (A) 10/01/2020   HGBA1C 7.5 (H) 05/16/2020   HGBA1C 8.5 (A) 12/11/2019   Lab Results  Component Value Date   MICROALBUR 1.8 05/16/2020   LDLCALC 127 (H) 05/16/2020   CREATININE 1.03 05/16/2020   Lab Results  Component Value Date   INSULIN 8.1 11/16/2017    2. Depression with anxiety Alexa [name]'s sleep is poor. Her anxiety is exacerbated currently. She is seeing her counselor more frequently. She is taking increased  dose of Xanax for sleep currently per primary care  provider. She is also on Lexapro 20 mg. Med non-compliance is a long standing issue which she works on with her counselor.  Assessment/Plan:   1. Type 2 diabetes mellitus with stage 3a chronic kidney disease, without long-term current use of insulin (HCC) We will refill Wegovy 2.4 mg weekly.  I encouraged her to be more compliant with metformin.   2. Depression with anxiety Behavior modification techniques were discussed today to help Alexa [name] deal with her anxiety.  Alexa [name] will continue to follow up with primary care provider and counselor. She will work on better medication compliance.   3. Obesity with Current BMI of  25.69 Alexa [name] is currently in the action stage of change. As such, her goal is to maintain weight for now. She has agreed to practicing portion control and making smarter food choices, such as increasing vegetables and decreasing simple carbohydrates.   Alexa [name] will work on maintaining her weight.  - Semaglutide-Weight Management 2.4 MG/0.75ML SOAJ; Inject 2.4 mg into the skin once a week.  Dispense: 3 mL; Refill: 1  Exercise goals:  Alexa [name] will work on adding resistance training and walking a few days per week. We discussed that she has had a loss of muscle mass of about 15 lbs since Feb 2022.  Behavioral modification strategies: increasing lean protein intake, decreasing simple carbohydrates, and meal planning and cooking strategies.  Alexa [name] has agreed to follow-up with our clinic in 8 weeks.  Objective:   Blood pressure  125/79, pulse 81, temperature 97.7 F (36.5 C), height 5\' 3"  (1.6 m), weight 145 lb (65.8 kg), last menstrual period 05/11/2010, SpO2 98 %. Body mass index is 25.69 kg/m.  General: Cooperative, alert, well developed, in no acute distress. HEENT: Conjunctivae and lids unremarkable. Cardiovascular: Regular rhythm.  Lungs: Normal work of breathing. Neurologic: No focal deficits.   Lab Results  Component Value Date   CREATININE 1.03 05/16/2020    BUN 15 05/16/2020   NA 138 05/16/2020   K 5.0 05/16/2020   CL 104 05/16/2020   CO2 23 05/16/2020   Lab Results  Component Value Date   ALT 11 05/16/2020   AST 12 05/16/2020   ALKPHOS 85 05/27/2016   BILITOT 0.5 05/16/2020   Lab Results  Component Value Date   HGBA1C 7.6 (A) 10/01/2020   HGBA1C 7.5 (H) 05/16/2020   HGBA1C 8.5 (A) 12/11/2019   HGBA1C 7.2 (A) 09/08/2019   HGBA1C 7.7 (A) 06/02/2019   Lab Results  Component Value Date   INSULIN 8.1 11/16/2017   Lab Results  Component Value Date   TSH 2.09 05/16/2020   Lab Results  Component Value Date   CHOL 210 (H) 05/16/2020   HDL 65 05/16/2020   LDLCALC 127 (H) 05/16/2020   TRIG 84 05/16/2020   CHOLHDL 3.2 05/16/2020   Lab Results  Component Value Date   VD25OH 71 03/27/2019   VD25OH 64 11/01/2017   VD25OH 34 06/08/2017   Lab Results  Component Value Date   WBC 7.4 07/23/2020   HGB 12.9 07/23/2020   HCT 40.0 07/23/2020   MCV 85.3 07/23/2020   PLT 407 (H) 07/23/2020   Lab Results  Component Value Date   IRON 55 05/08/2013   TIBC 357 04/14/2012   Attestation Statements:   Reviewed by clinician on day of visit: allergies, medications, problem list, medical history, surgical history, family history, social history, and previous encounter notes.  I, Lizbeth Bark, RMA, am acting as Location manager for [employer].   I have reviewed the above documentation for accuracy and completeness, and I agree with the above. -  Georgianne Fick, FNP

## 2020-12-12 ENCOUNTER — Other Ambulatory Visit (HOSPITAL_COMMUNITY): Payer: Self-pay

## 2020-12-16 ENCOUNTER — Ambulatory Visit: Payer: 59 | Admitting: Podiatry

## 2020-12-16 ENCOUNTER — Other Ambulatory Visit: Payer: Self-pay

## 2020-12-16 ENCOUNTER — Other Ambulatory Visit (HOSPITAL_COMMUNITY): Payer: Self-pay

## 2020-12-16 DIAGNOSIS — L600 Ingrowing nail: Secondary | ICD-10-CM

## 2020-12-16 MED ORDER — CEPHALEXIN 500 MG PO CAPS
500.0000 mg | ORAL_CAPSULE | Freq: Three times a day (TID) | ORAL | 0 refills | Status: DC
  Filled 2020-12-16: qty 21, 7d supply, fill #0

## 2020-12-16 NOTE — Patient Instructions (Signed)

## 2020-12-22 NOTE — Progress Notes (Signed)
Subjective: 60 year old female presents the office today for concerns of ingrown toenail left big toe with the lateral side worse than medial.  She denies any swelling or redness to the nail border.  No pus at this time.  The area is tender.  No other concerns.  Objective: AAO x3, NAD DP/PT pulses palpable bilaterally, CRT less than 3 seconds Incurvation present to the left hallux toenail both the medial and lateral aspects.  There is localized edema and erythema to both nail borders with lateral aspect worse than medial.  There is no drainage or pus.  No ascending cellulitis.  No pain with calf compression, swelling, warmth, erythema  Assessment: Ingrown toenail left hallux  Plan: -All treatment options discussed with the patient including all alternatives, risks, complications.  -At this time, the patient is requesting partial nail removal with chemical matricectomy to the symptomatic portion of the nail. Risks and complications were discussed with the patient for which they understand and written consent was obtained. Under sterile conditions a total of 3 mL of a mixture of 2% lidocaine plain and 0.5% Marcaine plain was infiltrated in a hallux block fashion. Once anesthetized, the skin was prepped in sterile fashion. A tourniquet was then applied. Next the medial and lateral aspect of hallux nail border was then sharply excised making sure to remove the entire offending nail border. Once the nails were ensured to be removed area was debrided and the underlying skin was intact. There is no purulence identified in the procedure. Next phenol was then applied under standard conditions and copiously irrigated. Silvadene was applied. A dry sterile dressing was applied. After application of the dressing the tourniquet was removed and there is found to be an immediate capillary refill time to the digit. The patient tolerated the procedure well any complications. Post procedure instructions were discussed the  patient for which he verbally understood. Follow-up in one week for nail check or sooner if any problems are to arise. Discussed signs/symptoms of infection and directed to call the office immediately should any occur or go directly to the emergency room. In the meantime, encouraged to call the office with any questions, concerns, changes symptoms. -Keflex -Patient encouraged to call the office with any questions, concerns, change in symptoms.   Trula Slade DPM

## 2020-12-23 DIAGNOSIS — F39 Unspecified mood [affective] disorder: Secondary | ICD-10-CM | POA: Diagnosis not present

## 2020-12-25 ENCOUNTER — Other Ambulatory Visit (HOSPITAL_COMMUNITY): Payer: Self-pay

## 2021-01-02 ENCOUNTER — Ambulatory Visit (INDEPENDENT_AMBULATORY_CARE_PROVIDER_SITE_OTHER): Payer: 59 | Admitting: "Endocrinology

## 2021-01-02 ENCOUNTER — Ambulatory Visit: Payer: 59 | Admitting: Podiatry

## 2021-01-02 ENCOUNTER — Ambulatory Visit (INDEPENDENT_AMBULATORY_CARE_PROVIDER_SITE_OTHER): Payer: 59 | Admitting: Adult Health

## 2021-01-02 ENCOUNTER — Encounter: Payer: Self-pay | Admitting: *Deleted

## 2021-01-02 ENCOUNTER — Encounter: Payer: Self-pay | Admitting: Adult Health

## 2021-01-02 ENCOUNTER — Other Ambulatory Visit: Payer: Self-pay

## 2021-01-02 ENCOUNTER — Other Ambulatory Visit (HOSPITAL_COMMUNITY): Payer: Self-pay

## 2021-01-02 VITALS — BP 139/86 | HR 104 | Ht 63.0 in | Wt 147.0 lb

## 2021-01-02 DIAGNOSIS — F411 Generalized anxiety disorder: Secondary | ICD-10-CM | POA: Diagnosis not present

## 2021-01-02 DIAGNOSIS — G4700 Insomnia, unspecified: Secondary | ICD-10-CM | POA: Diagnosis not present

## 2021-01-02 DIAGNOSIS — F331 Major depressive disorder, recurrent, moderate: Secondary | ICD-10-CM | POA: Diagnosis not present

## 2021-01-02 DIAGNOSIS — F39 Unspecified mood [affective] disorder: Secondary | ICD-10-CM | POA: Diagnosis not present

## 2021-01-02 DIAGNOSIS — F4310 Post-traumatic stress disorder, unspecified: Secondary | ICD-10-CM | POA: Diagnosis not present

## 2021-01-02 DIAGNOSIS — L600 Ingrowing nail: Secondary | ICD-10-CM

## 2021-01-02 MED ORDER — DULOXETINE HCL 30 MG PO CPEP
30.0000 mg | ORAL_CAPSULE | Freq: Every day | ORAL | 0 refills | Status: DC
  Filled 2021-01-02: qty 90, 90d supply, fill #0

## 2021-01-02 NOTE — Progress Notes (Signed)
[facility] MD/PA/NP Initial Note  01/03/2021 8:00 AM Alexa [name]  MRN:  [id number]  Chief Complaint:   HPI:  Referred by therapist - [employer]  Describes mood today as "ok". Pleasant. Mood symptoms - reports depression, anxiety, and irritability. Stating "I feel like I'm under a tremendous amount of stress". Increased stressors in the work setting. 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 She likes the job, but feels stressed with nursing shortages and inconsistency of staff. Reports she has not been taking care of herself and is having multiple health concerns. Has historically neglected her needs, but is "working on it". She has taken Lexapro on and off over the years without consistent use. Feels like she needs to restart the Lexapro or another medication to help manage mood symptoms. She and therapist discussed Cymbalta as a potential choice with the combination of mood symptoms and chronic pain issues. Seeing therapist regularly. Stable interest and motivation. Taking medications as prescribed.  Energy levels stable. Active, does not have a regular exercise routine.   Enjoys some usual interests and activities. Married x 21 years. Lives with husband and 3 cats. Spending time with family and friends. Appetite adequate. Weight stable - 147 pounds. Sleeping difficulties. Averages 6 hours with 2mg  of Xanax. Focus and concentration stable. Completing tasks. Managing aspects of household. Has worked as a Marine scientist for 36 years. Denies SI or HI.  Denies AH or VH. Multiple health issues.  Previous medication trials:  Lexapro   Visit Diagnosis:    ICD-10-CM   1. Insomnia, unspecified type  G47.00     2. PTSD (post-traumatic stress disorder)  F43.10 DULoxetine (CYMBALTA) 30 MG capsule    3. Generalized anxiety disorder  F41.1 DULoxetine (CYMBALTA) 30 MG capsule    4. Major depressive disorder, recurrent episode, moderate (HCC)  F33.1  DULoxetine (CYMBALTA) 30 MG capsule      Past Psychiatric History: Denies psychiatric hospitalization.   Past Medical History:  Past Medical History:  Diagnosis Date   Anemia, iron deficiency    Anhedonia    Anxiety    Asthma    Asthma, chronic    Back pain    CAD (coronary artery disease)    Cervical spondylosis 06/13/2020   Chewing difficulty    Chronic insomnia    Chronic kidney disease    CKD stage 3 A    Combined hyperlipidemia    Constipation    Depression    Diabetes mellitus    Diabetes mellitus type II    Diabetic autonomic neuropathy (HCC)    Difficulty swallowing pills    DM type 2 with diabetic peripheral neuropathy (HCC)    Dry mouth    Dyspepsia    Dyspnea    Elevated homocysteine    Essential tremor    Fatigue    Fatty liver    GERD (gastroesophageal reflux disease)    GERD (gastroesophageal reflux disease)    Glossitis    Goiter    Hemorrhoids    HLD (hyperlipidemia)    Hyperparathyroidism , secondary, non-renal (HCC)    Hypertension    Hypocalcemia    Insomnia    Leg edema    Leg pain    Macular degeneration    Nonproliferative diabetic retinopathy associated with type 2 diabetes mellitus (HCC)    RESOLVED   Obesity, Class III, BMI 40-49.9 (morbid obesity) (HCC)    Osteopenia    Osteoporosis    Pallor  PONV (postoperative nausea and vomiting)    Status post gastric surgery    Tachycardia    Thyroiditis, autoimmune    Vaginal dryness, menopausal    Vitamin D deficiency     Past Surgical History:  Procedure Laterality Date   BREAST REDUCTION SURGERY Bilateral 12/14/2017   Procedure: BILATERAL MAMMARY REDUCTION  (BREAST);  Surgeon: Irene Limbo, MD;  Location: [facility];  Service: Plastics;  Laterality: Bilateral;   CARDIAC CATHETERIZATION     x 2   COLONOSCOPY  09/2012   REDUCTION MAMMAPLASTY  11/2017   ROUX-EN-Y PROCEDURE     TONSILLECTOMY      Family Psychiatric History: Denies any family history of  mental illness.   Family History:  Family History  Problem Relation Age of Onset   Thyroid disease Mother        Hypothyroid   Depression Mother    Liver disease Mother    Obesity Mother    Diabetes Father        T2 DM   Hypertension Father    Hyperlipidemia Father    Heart disease Father    Stroke Father    Kidney Stones Father    Obesity Brother        250 pounds at a height of 76 inches.   Thyroid disease Sister        Hypothyroid   Diabetes Maternal Grandmother    Colon cancer Other        great grandfather   Liver disease Maternal Aunt        NASH   Esophageal cancer Neg Hx    Pancreatic cancer Neg Hx    Kidney disease Neg Hx    Colon polyps Neg Hx    Rectal cancer Neg Hx    Stomach cancer Neg Hx     Social History:  Social History   Socioeconomic History   Marital status: Married    Spouse name: Corporate investment banker   Number of children: 0   Years of education: Not on file   Highest education level: Not on file  Occupational History   Occupation: Therapist, sports  Tobacco Use   Smoking status: Never   Smokeless tobacco: Never  Vaping Use   Vaping Use: Never used  Substance and Sexual Activity   Alcohol use: No    Alcohol/week: 0.0 standard drinks   Drug use: No   Sexual activity: Not on file  Other Topics Concern   Not on file  Social History Narrative   Not on file   Social Determinants of Health   Financial Resource Strain: Not on file  Food Insecurity: Not on file  Transportation Needs: Not on file  Physical Activity: Not on file  Stress: Not on file  Social Connections: Not on file    Allergies:  Allergies  Allergen Reactions   Theophyllines Palpitations    Metabolic Disorder Labs: Lab Results  Component Value Date   HGBA1C 7.6 (A) 10/01/2020   MPG 169 05/16/2020   MPG 338 11/17/2016   No results found for: PROLACTIN Lab Results  Component Value Date   CHOL 210 (H) 05/16/2020   TRIG 84 05/16/2020   HDL 65 05/16/2020   CHOLHDL 3.2 05/16/2020    VLDL 10 03/05/2017   LDLCALC 127 (H) 05/16/2020   LDLCALC 96 12/11/2019   Lab Results  Component Value Date   TSH 2.09 05/16/2020   TSH 1.95 12/11/2019    Therapeutic Level Labs: No results found for: LITHIUM No results  found for: VALPROATE No components found for:  CBMZ  Current Medications: Current Outpatient Medications  Medication Sig Dispense Refill   DULoxetine (CYMBALTA) 30 MG capsule Take 1 capsule (30 mg total) by mouth daily. 90 capsule 0   albuterol (PROVENTIL) (2.5 MG/3ML) 0.083% nebulizer solution Take 3 mLs (2.5 mg total) by nebulization every 6 (six) hours as needed for wheezing or shortness of breath. 150 mL 1   albuterol (VENTOLIN HFA) 108 (90 Base) MCG/ACT inhaler Inhale 2 puffs into the lungs every 6 (six) hours as needed for wheezing or shortness of breath. 6.7 g 11   ALPRAZolam (XANAX) 0.5 MG tablet Take 0.5-1 tablets (0.25-0.5 mg total) by mouth 2 (two) times daily as needed for anxiety. 60 tablet 0   alprazolam (XANAX) 2 MG tablet Take 1 tablet (2 mg total) by mouth at bedtime as needed for sleep. 30 tablet 2   betamethasone valerate ointment (VALISONE) 0.1 % Apply 1 application topically 2 (two) times daily. 30 g 1   Blood Glucose Monitoring Suppl (FREESTYLE LITE) DEVI Use to check glucose 3x daily 2 each 5   calcium-vitamin D (OSCAL WITH D) 500-200 MG-UNIT TABS tablet Take by mouth.     carvedilol (COREG) 3.125 MG tablet TAKE 1 TABLET BY MOUTH TWO TIMES DAILY WITH A MEAL 120 tablet 10   cephALEXin (KEFLEX) 500 MG capsule Take 1 capsule (500 mg total) by mouth 3 (three) times daily. 21 capsule 0   Cholecalciferol 50 MCG (2000 UT) CAPS Take 1 capsule by mouth daily.     conjugated estrogens (PREMARIN) vaginal cream Place 1 applicatorful vaginally at bedtime for 21 days then off for 7 days. 30 g 11   Cyanocobalamin (VITAMIN B 12) 100 MCG LOZG Take by mouth.     cyclobenzaprine (FLEXERIL) 10 MG tablet Take 1 tablet (10 mg total) by mouth 3 (three) times daily as  needed for muscle spasms. 90 tablet 0   Efinaconazole 10 % SOLN Apply 1 drop topically daily. 4 mL 11   escitalopram (LEXAPRO) 20 MG tablet Take 1 tablet (20 mg total) by mouth daily. 90 tablet 2   Ferrous Sulfate (SLOW FE PO) Take 1 tablet by mouth daily.      glucose blood (FREESTYLE LITE) test strip Use 1 to check blood glucose 3 times daily. 300 each 1   ibuprofen (ADVIL,MOTRIN) 200 MG tablet Take 600 mg by mouth daily as needed for headache or moderate pain.     levothyroxine (SYNTHROID) 25 MCG tablet TAKE 1 TABLET BY MOUTH ONCE DAILY 90 tablet 1   metFORMIN (GLUCOPHAGE) 1000 MG tablet TAKE 1 TABLET BY MOUTH 2 TIMES DAILY WITH A MEAL. 180 tablet 0   Multiple Vitamin (MULTIVITAMIN) tablet Take 1 tablet by mouth daily. Reported on 02/26/2015 30 tablet 0   ondansetron (ZOFRAN) 4 MG tablet Take 1 tablet (4 mg total) by mouth every 8 (eight) hours as needed for nausea or vomiting. 20 tablet 0   pantoprazole (PROTONIX) 40 MG tablet TAKE 1 TABLET (40 MG TOTAL) BY MOUTH DAILY. 90 tablet 4   Semaglutide-Weight Management 2.4 MG/0.75ML SOAJ Inject 2.4 mg into the skin once a week. 3 mL 1   telmisartan (MICARDIS) 40 MG tablet TAKE 1/2 TABLET BY MOUTH TWO TIMES DAILY 90 tablet 3   traMADol (ULTRAM) 50 MG tablet Take 1 tablet (50 mg total) by mouth daily as needed for moderate pain or severe pain. 30 tablet 1   Vitamin D, Ergocalciferol, (DRISDOL) 50000 units CAPS capsule Take 50,000  Units by mouth every Sunday.     No current facility-administered medications for this visit.    Medication Side Effects: none  Orders placed this visit:  No orders of the defined types were placed in this encounter.   Psychiatric Specialty Exam:  Review of Systems  Musculoskeletal:  Negative for gait problem.  Neurological:  Negative for tremors.  Psychiatric/Behavioral:         Please refer to HPI   Blood pressure 139/86, pulse (!) 104, height 5\' 3"  (1.6 m), weight 147 lb (66.7 kg), last menstrual period  05/11/2010.Body mass index is 26.04 kg/m.  General Appearance: Casual and Neat  Eye Contact:  Good  Speech:  Clear and Coherent and Normal Rate  Volume:  Normal  Mood:  Euthymic  Affect:  Appropriate and Congruent  Thought Process:  Coherent and Descriptions of Associations: Intact  Orientation:  Full (Time, Place, and Person)  Thought Content: Logical   Suicidal Thoughts:  No  Homicidal Thoughts:  No  Memory:  WNL  Judgement:  Good  Insight:  Good  Psychomotor Activity:  Normal  Concentration:  Concentration: Good  Recall:  Good  Fund of Knowledge: Good  Language: Good  Assets:  Communication Skills Desire for Improvement Financial Resources/Insurance Housing Intimacy Leisure Time Physical Health Resilience Social Support Talents/Skills Transportation Vocational/Educational  ADL's:  Intact  Cognition: WNL  Prognosis:  Good   Screenings:  PHQ2-9    [facility] Office Visit from 11/28/2020 in [facility] Office Visit from 05/16/2020 in [facility] Office Visit from 03/31/2019 in [facility] Office Visit from 12/13/2017 in [facility] Office Visit from 11/16/2017 in [facility]  PHQ-2 Total Score 1 4 0 2 5  PHQ-9 Total Score 7 5 -- 6 17       Receiving Psychotherapy: Yes   Treatment Plan/Recommendations:  Plan:  PDMP reviewed  Add Cymbalta 30mg  daily  RTC 4 weeks  Patient advised to contact office with any questions, adverse effects, or acute worsening in signs and symptoms.    Time spent with patient was 60 minutes. Greater than 50% of face to face time with patient was spent on counseling and coordination of care.      Aloha Gell, NP

## 2021-01-02 NOTE — Progress Notes (Signed)
Doran Stabler, MD  Levonne Spiller, RN Thanks for the note.   On 12/27 light diet, then Suprep dose that evening and again in AM 12/28.  Clear liquid diet all day 12/28  12/28 4pm - 2 liters golytely , 8 oz Q 30 min  AM 12/29 remaining 2 liters golytely per usual start time for 130 procedure   - HD        Previous Messages   ----- Message -----  From: Levonne Spiller, RN  Sent: 01/01/2021  12:31 PM EST  To: Doran Stabler, MD   Dr.Danis,   Please explain how the patient should take the Suprep with Golytely per recall assessment sheet?  Her procedure is on 12/29 at 1:30 pm.  I just want to make sure we do this correct. Thank you, Loreto Loescher PV

## 2021-01-03 ENCOUNTER — Encounter: Payer: Self-pay | Admitting: Adult Health

## 2021-01-03 ENCOUNTER — Other Ambulatory Visit (HOSPITAL_COMMUNITY): Payer: Self-pay

## 2021-01-05 NOTE — Progress Notes (Signed)
Subjective: 60 year old female presents the office today for follow-up evaluation after undergoing partial nail avulsion of the left hallux.  She states that she was concerned as there was some tenderness along the nail border but there was a scab that came off and not seem to be doing much better after it came off.  No drainage or pus that she reports.  No increase in swelling or redness.  No other concerns.  Objective: AAO x3, NAD DP/PT pulses palpable bilaterally, CRT less than 3 seconds Status post partial nail avulsion to the left hallux toenail.  There is slight edema but there is no ascending cellulitis.  There is no drainage or pus.  No significant tenderness palpation on exam today.  No pain with calf compression, swelling, warmth, erythema  Assessment: Status post partial nail avulsion left hallux, healing well  Plan: -All treatment options discussed with the patient including all alternatives, risks, complications.  -Discussed daily washing with soap and water daily.  I would keep a small amount of antibiotic ointment followed by a bandage on during the day but can leave the area open at nighttime. -Monitor for any clinical signs or symptoms of infection and directed to call the office immediately should any occur or go to the ER. -Patient encouraged to call the office with any questions, concerns, change in symptoms.   Trula Slade DPM

## 2021-01-06 ENCOUNTER — Other Ambulatory Visit (INDEPENDENT_AMBULATORY_CARE_PROVIDER_SITE_OTHER): Payer: Self-pay | Admitting: Family Medicine

## 2021-01-06 ENCOUNTER — Other Ambulatory Visit (HOSPITAL_COMMUNITY): Payer: Self-pay

## 2021-01-06 DIAGNOSIS — E663 Overweight: Secondary | ICD-10-CM

## 2021-01-06 DIAGNOSIS — Z6829 Body mass index (BMI) 29.0-29.9, adult: Secondary | ICD-10-CM

## 2021-01-06 MED ORDER — WEGOVY 2.4 MG/0.75ML ~~LOC~~ SOAJ
2.4000 mg | SUBCUTANEOUS | 1 refills | Status: DC
  Filled 2021-01-06 – 2021-01-16 (×3): qty 3, 28d supply, fill #0
  Filled 2021-02-04: qty 3, 28d supply, fill #1

## 2021-01-06 NOTE — Telephone Encounter (Signed)
Pt last seen by Dawn Whitmire, FNP.  

## 2021-01-06 NOTE — Telephone Encounter (Signed)
LAST APPOINTMENT DATE: 12/11/20 NEXT APPOINTMENT DATE: 02/05/21   [name] [address] [place] [address] Phone: [phone] Fax: [place], [address] [place] [address] Phone: [phone] Fax: [phone]  Patient is requesting a refill of the following medications: Requested Prescriptions   Pending Prescriptions Disp Refills   Semaglutide-Weight Management (WEGOVY) 2.4 MG/0.75ML SOAJ 3 mL 1    Sig: Inject 2.4 mg into the skin once a week.    Date last filled: 12/11/20 Previously prescribed by [facility]  Lab Results  Component Value Date   HGBA1C 7.6 (A) 10/01/2020   HGBA1C 7.5 (H) 05/16/2020   HGBA1C 8.5 (A) 12/11/2019   Lab Results  Component Value Date   MICROALBUR 1.8 05/16/2020   LDLCALC 127 (H) 05/16/2020   CREATININE 1.03 05/16/2020   Lab Results  Component Value Date   VD25OH 71 03/27/2019   VD25OH 64 11/01/2017   VD25OH 34 06/08/2017    BP Readings from Last 3 Encounters:  12/11/20 125/79  11/28/20 118/82  10/22/20 126/85

## 2021-01-07 ENCOUNTER — Other Ambulatory Visit (HOSPITAL_COMMUNITY): Payer: Self-pay

## 2021-01-08 DIAGNOSIS — F39 Unspecified mood [affective] disorder: Secondary | ICD-10-CM | POA: Diagnosis not present

## 2021-01-09 ENCOUNTER — Other Ambulatory Visit: Payer: Self-pay

## 2021-01-09 ENCOUNTER — Ambulatory Visit (AMBULATORY_SURGERY_CENTER): Payer: Self-pay

## 2021-01-09 ENCOUNTER — Encounter: Payer: Self-pay | Admitting: Gastroenterology

## 2021-01-09 ENCOUNTER — Other Ambulatory Visit (HOSPITAL_COMMUNITY): Payer: Self-pay

## 2021-01-09 VITALS — Ht 63.0 in | Wt 144.0 lb

## 2021-01-09 DIAGNOSIS — Z86010 Personal history of colonic polyps: Secondary | ICD-10-CM

## 2021-01-09 MED ORDER — PEG 3350-KCL-NA BICARB-NACL 420 G PO SOLR
4000.0000 mL | Freq: Once | ORAL | 0 refills | Status: AC
Start: 2021-01-09 — End: 2021-01-18
  Filled 2021-01-09: qty 4000, 1d supply, fill #0

## 2021-01-09 MED ORDER — NA SULFATE-K SULFATE-MG SULF 17.5-3.13-1.6 GM/177ML PO SOLN
1.0000 | Freq: Once | ORAL | 0 refills | Status: AC
  Filled 2021-01-09 (×2): qty 354, 1d supply, fill #0

## 2021-01-09 NOTE — Progress Notes (Signed)
Denies allergies to eggs or soy products. Denies complication of anesthesia or sedation. Denies use of weight loss medication. Denies use of O2.   Emmi instructions given for colonoscopy.  

## 2021-01-16 ENCOUNTER — Other Ambulatory Visit (HOSPITAL_COMMUNITY): Payer: Self-pay

## 2021-01-23 ENCOUNTER — Telehealth: Payer: Self-pay | Admitting: Gastroenterology

## 2021-01-23 ENCOUNTER — Encounter: Payer: 59 | Admitting: Gastroenterology

## 2021-01-24 ENCOUNTER — Other Ambulatory Visit (HOSPITAL_COMMUNITY): Payer: Self-pay

## 2021-01-24 MED ORDER — DOXYCYCLINE HYCLATE 100 MG PO TABS
100.0000 mg | ORAL_TABLET | Freq: Two times a day (BID) | ORAL | 0 refills | Status: DC
Start: 2021-01-24 — End: 2021-04-04
  Filled 2021-01-24: qty 20, 10d supply, fill #0

## 2021-01-24 NOTE — Patient Instructions (Addendum)
Doxycycline has been prescribed as discussed.  Take Xanax 2 mg at bedtime for insomnia.  Continue other medications as prescribed.  Continue counseling.  Physical exam booked for May 2023.

## 2021-02-04 ENCOUNTER — Other Ambulatory Visit (HOSPITAL_COMMUNITY): Payer: Self-pay

## 2021-02-04 NOTE — Progress Notes (Signed)
Subjective:  Patient Name: Alexa [name] Date of Birth: November 15, 1960  MRN: [id number]  Alexa [name]  presents to the office today for follow-up of her type 2 diabetes mellitus, obesity, combined hyperlipidemia, GERD, hypertension, ASHD, dyspepsia, pedal edema, non-proliferative diabetic retinopathy, goiter, depression, autonomic neuropathy, tachycardia, peripheral neuropathy, acquired hypothyroidism, vitamin D deficiency, hypocalcemia, secondary hyperparathyroidism, glossitis, pallor, fatigue, iron deficiency anemia, anhedonia, disinclination to take medicines, and status post Roux-en-Y gastric bypass.   HISTORY OF PRESENT ILLNESS:   Alexa [name] is a 61 y.o. Caucasian woman. Alexa [name] was unaccompanied.  1. The patient first presented to me on 08/20/04 in referral from her primary care internist, Dr. Emeline General, for evaluation and management of her type 2 diabetes, obesity, and multiple medical issues. She was 61 years old.   [name] had a long history of obesity. She was heavy as a child. She underwent menarche at age 37. At age 61 she was 180 pounds. In 1996 she weighed 218 pounds. In 1999 she was diagnosed with type 2 diabetes mellitus. Her weight at that point was 250 pounds. She was treated with Glucophage, and Actos. Actos made her gain even more weight. She had also been treated with glipizide in the past. More recently she had been treated with Lantus and Glucophage plus regular insulin as needed, especially for when she took steroids for asthma attacks. Maximum weight had been 296 pounds one month prior to that first visit with me. Her tendency to gain weight and her difficulty in losing weight were aggravated by long-standing, intermittent depression and by severe, recurrent asthma requiring the use of steroid medications.  B. Past medical history was also positive for a 60% blockage in one of her coronary arteries. She had significant issues with GERD and dyspepsia. She also had combined  hyperlipidemia. She had a previous cardiac catheterization and previous tonsillectomy. She was allergic to theophylline. Her pertinent review of systems was positive for some numbness and tingling in her feet. Family history was positive for type 2 diabetes in her father and her maternal grandmother. Her brother weighed 250 pounds at a height of 76 inches. Both her mother and her sister were hypothyroid.  C. On physical examination, her weight was 279.9 pounds. Her BMI was 49.6. Her blood pressure was 142/86. Her heart rate was 96. Her hemoglobin A1c was 9.8%. She was alert and oriented x3. Her affect was normal. Her insight was fair. She was obviously quite obese. She had a 25 gm thyroid gland. She had 1+ tremor of her hands. She had 1+ DP pulses and 2+ tinea pedis. Sensation to touch was intact in her feet. Laboratory data included a normal CMP. Her cholesterol was 175, triglycerides 76, HDL 53, and LDL 107. Her TSH was 2.98. Since she obviously did have type 2 diabetes mellitus associated with morbid obesity, and since weight loss was a major factor for her, I asked her to resume her metformin twice daily. I also started her on Byetta, initially 5 mcg twice daily and later 10 mcg twice daily. I discontinued her Lantus insulin.  2. During the last 16 years,we have had some successes, some failures, and some new problem areas.  A. T2DM: In October 2007 the combination of Byetta and metformin was causing more gastrointestinal problems. She opted to stop the metformin and continue the Byetta because it was helping her with weight control and blood sugar control. By 08/11/06 her weight had decreased to 267.6 pounds. Hemoglobin A1c was 8.6%. At that point  she decided to have bariatric surgery. She had a Roux-en-Y gastric bypass on 12/20/2006. She subsequently lost weight down to 173.4 pounds on 05/23/08, but then subsequently regained weight to the 190s. Her  hemoglobin A1c values varied in parallel with her weights.  On 05/23/08, at the point of her lowest weight, her hemoglobin A1c dropped to a nadir of 6.3%.  Since then her hemoglobin A1c values have varied between 6.6 and 13.4%. Although we were initially able to stop all of her diabetes medicines after bariatric surgery, when she began to regain weight we restarted metformin, 500 mg twice daily. Although she took her metformin twice daily for a long time, she had discontinued it many years ago. She hated to take medicines and could not make herself be compliant in taking medications or exercising. When her HbA1c increased to 9.4% in September 2015, I stopped her Byetta injections and started her on liraglutide (Victoza) daily injections on 11/16/13. After several more years of noncompliance, she has been doing much better in the past 2 years.   B. Vitamin D deficiency, hypocalcemia, and secondary hyperparathyroidism: Just before her gastric bypass, I obtained baseline bone mineral metabolism studies. Her 25-hydroxy vitamin D was very low at 7 (normal greater than or equal to 30). Her calcium was 9.5 (normal 8.6-10.6). Her parathyroid hormone was 38.4 (normal 14-72). Subsequent to her surgery, the patient was supposed to be taking multivitamins with calcium and vitamin D, but did not always do so. On 03/29/2007 her 25-hydroxy vitamin D was 29, but her calcium decreased to 8.7. Her PTH was slightly elevated at 73.5. Her 1,25-hydroxy vitamin D was 46. Her iron was 56. I asked her to make sure she took her multivitamins and calcium daily. Unfortunately, she has often been non-compliant with her multivitamins and calcium. Her 25-hydroxy vitamin D values have varied between 15-35. Her calcium values have varied between 8.1-9.4. Her PTH values have remained elevated between 79-167. Alexa [name]'s iron levels have also been low, between 32-34. She was supposed to be taking iron every day, but frequently did not do so. In the past year, however, she has been doing better at taking her  vitamin D, calcium, and iron. In May 2019 her vitamin D was at the lower end of the reference range, her calcium was about the 40% of the reference range, but her PTH was still significantly elevated. In December 2020 her PTH and calcium were mid-normal.   C. Alexa [name] has had psychological issues for many years that have adversely affected her eating, her unwillingness to exercise, her noncompliance with taking medications, her weight, and her T2DM care. Fortunately, her recent outpatient psychological therapy with Ms. Rea College, RN, MS and her taking Lexapro regularly have resulted in Alexa [name] having a much more positive attitude about life in general and a generally more positive attitude about taking her medications. Fortunately, Alexa [name]'s has been dealing successfully with her issues and her compliance has markedly improved.   D. Obesity: Although Alexa [name] regained a great deal of weight in the years following her bariatric surgery, she has been taking Contrave more recently and has been able to lose all of the weight that she had gained after her bariatric surgery.   3. Alexa [name]'s last [facility] visit was on 10/01/20. At that visit I asked her to continue her morning dose of metformin of 1000 mg and continue the evening dose of 1000 mg. I continued her 25 mcg Synthroid dose at bedtime. I asked her to take one Biotech, 50,000 IU  capsule of vitamin D weekly and calcium, 600 mg, at each meal.   A. In the interim she has been having more pain in the shoulders and back. She had PT and the left shoulder improved, but is worse now. She also had a severe URI for a week after Xmas and did not work for a week.   B. She has been much more anxious, in part due to the pain. Dr. Renold Genta prescribed Xanax. Alexa [name] has since then seen Ms. Deloria Lair, NP,  who put her on Cymbalta. Alexa [name]'s pain and anxiety deceased markedly.  C. Her father had stroke recently, which has been a big stressor for Alexa [name]. She is spending more time  with him and her mother.  D. She has lost 11 pounds since her last visit while taking Wegovy 2.6 mg weekly and has lost muscle mass. She feels weak. She is followed at [facility] monthly. It has been recommended that she stop the [facility] and convert to [place] or Victoza. She prefers the once weekly Ozempic. She had her last dose of Wegovy on 02/04/20. She will start Ozempic, 1 mg/week sq next Monday.  E.  She stopped taking  all of her other medications, including: metformin, 1000 mg twice daily, Lexapro, 20 mg/day; Synthroid, 25 mcg/day; Micardis, 20 mg, twice daily; Protonix, 40 mg/day, calcium, Coreg, 3.125 mg, twice daily, B12 daily, and Biotech once weekly.  F. She is also not taking oral fungal treatment for her tinea pedis.  G. She pretty much stopped drinking cokes.   H. Dr. Renold Genta referred her to nephrology, Dr. Joylene Grapes. Her serum creatinine decreased to 0.92. She was initially diagnosed with stage 3A renal disease. Her renal US in August 2021 was "unremarkable". At her second visit Dr. Joylene Grapes told her not to come back.   I. Her husband has heart failure. He is doing "pretty well". He takes his medicine.   J. She has more shakiness of her hands, especially the left hand. 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   K. She has not been sleeping better with Xanax at bedtime. She does drink coffee on some mornings and tea at times.   L. She has not had much of a problem with allergies or asthma this year.    M. She has continued to have hot flashes.   N. She has not been bothered by pains in her anterior shins, but does have occasional calf cramps.    O. 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 She has not been walking for exercise..    P. She is trying to achieve more balance. She change her work hours to two days per week in late May.    4. Review of Systems:  Constitutional: "My back and shoulders were hurting a lot, but Cymbalta really helps." Marital relations  are going better.   Eyes: Her eyes feel better. Her last eye exam occurred in February or March 2022. She had "the beginnings of diabetes eye disease", but no treatment is needed. She has a follow up visit soon.  Neck: She occasionally has posterior neck and trapezius pains. The patient has no complaints of anterior neck swelling, soreness, tenderness, pressure, or discomfort.  Heart: She has not had chest pain recently. Heart rate increases with exercise or other physical activity. She has no complaints of palpitations or irregular heat beats. She saw Dr. Irish Lack in August 2021. She was told to return if needed. Gastrointestinal: She had a colonoscopy and had  a polyp removed. She no longer has nausea. She rarely  has the sensation of stomach pains. She still occasionally has post-prandial bloating if she eats too much. She always has constipation. She occasionally has head hunger and belly hunger. She is not having acid reflux, upset stomach, stomach aches or pains, swallowing difficulties, diarrhea. Her GI doctor told her to take Miralax daily, but she has not been doing so.  Hands: She has more tremors.  Legs: As above. She no longer has leg cramps after working. She has few complaints of numbness, tingling, or burning. No edema is noted. Feet: There are few complaints of numbness, tingling, burning, or pain. No edema is noted. Tinea improved with her new laser therapy.   Neuro: No new sensory or muscular problems Psych: She is "doing better with Cymbalta".       GU: She has not been urinating a lot. She has not had much nocturia.   GYN: LMP was on 05/11/2010. She had a pap smear in 2020.  Hypoglycemia: She occasionally gets shaky, which she sometimes treats with a regular soda.    5. BG printout: She is not checking BGs.  PAST MEDICAL, FAMILY, AND SOCIAL HISTORY:  Past Medical History:  Diagnosis Date   Allergy    Anemia, iron deficiency    Anhedonia    Anxiety    Asthma    Asthma,  chronic    Back pain    CAD (coronary artery disease)    Cervical spondylosis 06/13/2020   Chewing difficulty    Chronic insomnia    Chronic kidney disease    CKD stage 3 A    Combined hyperlipidemia    Constipation    Depression    Diabetes mellitus    Diabetes mellitus type II    Diabetic autonomic neuropathy (HCC)    Difficulty swallowing pills    DM type 2 with diabetic peripheral neuropathy (HCC)    Dry mouth    Dyspepsia    Dyspnea    Elevated homocysteine    Essential tremor    Fatigue    Fatty liver    GERD (gastroesophageal reflux disease)    GERD (gastroesophageal reflux disease)    Glossitis    Goiter    Hemorrhoids    HLD (hyperlipidemia)    Hyperparathyroidism , secondary, non-renal (HCC)    Hypertension    Hypocalcemia    Insomnia    Leg edema    Leg pain    Macular degeneration    Nonproliferative diabetic retinopathy associated with type 2 diabetes mellitus (HCC)    RESOLVED   Obesity, Class III, BMI 40-49.9 (morbid obesity) (HCC)    Osteopenia    Osteoporosis    Pallor    PONV (postoperative nausea and vomiting)    Status post gastric surgery    Tachycardia    Thyroiditis, autoimmune    Vaginal dryness, menopausal    Vitamin D deficiency     Family History  Problem Relation Age of Onset   Thyroid disease Mother        Hypothyroid   Depression Mother    Liver disease Mother    Obesity Mother    Diabetes Father        T2 DM   Hypertension Father    Hyperlipidemia Father    Heart disease Father    Stroke Father    Kidney Stones Father    Obesity Brother        250 pounds at a height of  76 inches.   Thyroid disease Sister        Hypothyroid   Diabetes Maternal Grandmother    Colon cancer Other        great grandfather   Liver disease Maternal Aunt        NASH   Esophageal cancer Neg Hx    Pancreatic cancer Neg Hx    Kidney disease Neg Hx    Colon polyps Neg Hx    Rectal cancer Neg Hx    Stomach cancer Neg Hx      Current  Outpatient Medications:    albuterol (PROVENTIL) (2.5 MG/3ML) 0.083% nebulizer solution, Take 3 mLs (2.5 mg total) by nebulization every 6 (six) hours as needed for wheezing or shortness of breath., Disp: 150 mL, Rfl: 1   albuterol (VENTOLIN HFA) 108 (90 Base) MCG/ACT inhaler, Inhale 2 puffs into the lungs every 6 (six) hours as needed for wheezing or shortness of breath., Disp: 6.7 g, Rfl: 11   ALPRAZolam (XANAX) 0.5 MG tablet, Take 0.5-1 tablets (0.25-0.5 mg total) by mouth 2 (two) times daily as needed for anxiety., Disp: 60 tablet, Rfl: 0   alprazolam (XANAX) 2 MG tablet, Take 1 tablet (2 mg total) by mouth at bedtime as needed for sleep., Disp: 30 tablet, Rfl: 2   betamethasone valerate ointment (VALISONE) 0.1 %, Apply 1 application topically 2 (two) times daily., Disp: 30 g, Rfl: 1   Blood Glucose Monitoring Suppl (FREESTYLE LITE) DEVI, Use to check glucose 3x daily, Disp: 2 each, Rfl: 5   calcium-vitamin D (OSCAL WITH D) 500-200 MG-UNIT TABS tablet, Take by mouth., Disp: , Rfl:    carvedilol (COREG) 3.125 MG tablet, TAKE 1 TABLET BY MOUTH TWO TIMES DAILY WITH A MEAL, Disp: 120 tablet, Rfl: 10   Cholecalciferol 50 MCG (2000 UT) CAPS, Take 1 capsule by mouth daily., Disp: , Rfl:    conjugated estrogens (PREMARIN) vaginal cream, Place 1 applicatorful vaginally at bedtime for 21 days then off for 7 days., Disp: 30 g, Rfl: 11   Cyanocobalamin (VITAMIN B 12) 100 MCG LOZG, Take by mouth., Disp: , Rfl:    cyclobenzaprine (FLEXERIL) 10 MG tablet, Take 1 tablet (10 mg total) by mouth 3 (three) times daily as needed for muscle spasms., Disp: 90 tablet, Rfl: 0   doxycycline (VIBRA-TABS) 100 MG tablet, Take 1 tablet (100 mg total) by mouth 2 (two) times daily., Disp: 20 tablet, Rfl: 0   DULoxetine (CYMBALTA) 30 MG capsule, Take 1 capsule (30 mg total) by mouth daily., Disp: 90 capsule, Rfl: 0   Efinaconazole 10 % SOLN, Apply 1 drop topically daily., Disp: 4 mL, Rfl: 11   escitalopram (LEXAPRO) 20 MG  tablet, Take 1 tablet (20 mg total) by mouth daily., Disp: 90 tablet, Rfl: 2   Ferrous Sulfate (SLOW FE PO), Take 1 tablet by mouth daily. , Disp: , Rfl:    glucose blood (FREESTYLE LITE) test strip, Use 1 to check blood glucose 3 times daily., Disp: 300 each, Rfl: 1   ibuprofen (ADVIL,MOTRIN) 200 MG tablet, Take 600 mg by mouth daily as needed for headache or moderate pain., Disp: , Rfl:    levothyroxine (SYNTHROID) 25 MCG tablet, TAKE 1 TABLET BY MOUTH ONCE DAILY, Disp: 90 tablet, Rfl: 1   metFORMIN (GLUCOPHAGE) 1000 MG tablet, TAKE 1 TABLET BY MOUTH 2 TIMES DAILY WITH A MEAL., Disp: 180 tablet, Rfl: 0   Multiple Vitamin (MULTIVITAMIN) tablet, Take 1 tablet by mouth daily. Reported on 02/26/2015, Disp: 30 tablet, Rfl:  0   ondansetron (ZOFRAN) 4 MG tablet, Take 1 tablet (4 mg total) by mouth every 8 (eight) hours as needed for nausea or vomiting., Disp: 20 tablet, Rfl: 0   pantoprazole (PROTONIX) 40 MG tablet, TAKE 1 TABLET (40 MG TOTAL) BY MOUTH DAILY., Disp: 90 tablet, Rfl: 4   Semaglutide, 1 MG/DOSE, (OZEMPIC, 1 MG/DOSE,) 4 MG/3ML SOPN, Inject 1 mg into the skin once a week., Disp: 3 mL, Rfl: 1   telmisartan (MICARDIS) 40 MG tablet, TAKE 1/2 TABLET BY MOUTH TWO TIMES DAILY, Disp: 90 tablet, Rfl: 3   traMADol (ULTRAM) 50 MG tablet, Take 1 tablet (50 mg total) by mouth daily as needed for moderate pain or severe pain., Disp: 30 tablet, Rfl: 1   Vitamin D, Ergocalciferol, (DRISDOL) 50000 units CAPS capsule, Take 50,000 Units by mouth every Sunday., Disp: , Rfl:   Allergies as of 02/05/2021 - Review Complete 02/05/2021  Allergen Reaction Noted   Theophyllines Palpitations 05/13/2010    1. Work and Family: She has reduced her workload to two days per week during the week.  2. Activities: She is very busy, but is not doing much exercise 3. Smoking, alcohol, or drugs: None 4. Primary Care Provider: Dr. Tommie Ard Baxley 5. Psych Ms. Deloria Lair, NP : [facility] 6.  Bariatrician: Dr. Redgie Grayer, MD/Ms. [name], NP  REVIEW OF SYSTEM: There are no other significant problems involving Shivangi's other body systems.   Objective:  Vital Signs:  BP 108/62 (BP Location: Right Arm, Patient Position: Sitting, Cuff Size: Normal)    Pulse (!) 102    Wt 142 lb (64.4 kg)    LMP 05/11/2010    BMI 25.15 kg/m    Wt Readings from Last 3 Encounters:  02/05/21 142 lb (64.4 kg)  02/05/21 138 lb (62.6 kg)  01/09/21 144 lb (65.3 kg)    Ht Readings from Last 3 Encounters:  02/05/21 _0  (1.6 m)  01/09/21 _1  (1.6 m)  12/11/20 _2  (1.6 m)   Body mass index is 25.15 kg/m. Facility age limit for growth percentiles is 20 years.  Body surface area is 1.69 meters squared.   PHYSICAL EXAM:  Constitutional:  Alexa [name] looks fragile today. She has lost 11 pounds since her last visit. Her weight is now much below the post-gastric bypass nadir of 173.4 pounds that it was in 2010. She is alert and bright. Her affect and insight are normal. She understands that when she just stops taking medications, she is hurting herself.  Eyes: There is no arcus or proptosis.  Mouth: The oropharynx appears normal. The tongue appears glossier today. There is normal oral moisture. There is no obvious gingivitis. Neck: There are no bruits present. The thyroid gland appears normal in size. The thyroid gland is again within normal limits at 18-20 grams in size. The left lobe is larger than the right. The consistency of the thyroid gland is normal. There is no thyroid tenderness to palpation. Lungs: The lungs are clear. Air movement is good. Heart: The heart rhythm and rate appear normal. Heart sounds S1 and S2 are normal. I do not appreciate any pathologic heart murmurs. Abdomen: The abdomen is smaller. Bowel sounds are normal. The abdomen is soft and non-tender. There is no obviously palpable hepatomegaly, splenomegaly, or other masses.  Arms: Muscle mass appears appropriate for age.   Hands: She has a 2+ tremor. Phalangeal and metacarpophalangeal joints appear normal. Palms are normal. Legs: Muscle mass appears appropriate for age. There  is no edema. Her shins are not tender to touch today.  Feet: There are no significant deformities. Dorsalis pedis pulses are faint 1+ on the right and 1+ on the left. She has much less visible tinea in her great toenails.   Neurologic: Muscle strength is normal for age and gender  in both the upper and the lower extremities. Muscle tone appears normal. Sensation to touch is normal in the legs and feet.   LAB DATA:  Labs 02/05/21: HbA1c 7.0%, CBG 151  Labs 10/01/20: HbA1c 7.2%, CBG 131  Labs 07/23/20: CBC normal, except platelets 407 (ref 140-400); B. Burgdorff antibodies, IgM reactive for Lyme disease; RMSF antibodies not detected  Labs 05/16/20: HbA1c 7.5%; TSH 2.09: CMP normal, except glucose 135 and GFR 59; CBC normal; cholesterol 210, triglycerides 84, HDL 65, LDL 145;  Folate 11.9 (ref >5.4); ESR 6 (ref 0-30); Rheumatoid factor <16; cyclic citrulline peptide antibody <16; vitamin B12 1007 (ref [phone]); Urinary microalbumin/creatinine ratio 7; anti-Smith antibody <1; anti-DNA antibody <1; anti-nuclear antibody positive 1:40  Labs 12/11/19: HbA1c 8.5%  Labs 09/08/19: HbA1c 7.2%, CBG 98  Labs 09/04/19: PTH 40, calcium 8.9, 25-OH vitamin D 42  Labs 06/02/19: HbA1c 7.7%, CBG 138  Labs 02/02/19: HbA1c 7.7%, CBG 101  Labs 01/25/19: TSH 1.87, free T4 1.3, free T3 2.6; C-peptide 1.27 (ref 0.80-3.85); CMP normal except creatinine 1.25; cholesterol 169, triglycerides 81, HDL 65, LDL 87; PTH 42 (ref 14-64), calcium 9.4 (ref 8.6-10.4); urinary microalbumin/creatinine ratio 9 (ref <30)   Labs 09/08/18: HbA1c 8.2%, CBG 123  Labs 03/24/18: HbA1c 7.7%, CBG 103  Labs 01/03/18: CBG 112 fasting  Lbs 12/02/17: sodium 134, glucose 322, creatinine 1.17  Labs 11/16/17: Urine microalbumin/creatinine ratio 4.8; B12 282 (ref [phone]), folate 12.7 (ref  >3.0)  Labs 11/01/17: HbA1c 6.9%, CBG 189 post-prandially; PTH 108, calcium 8.6, 25-OH vitamin D 64   Labs 09/13/17: Hepatic function panel normal; CBC normal  Labs 08/09/17: Hepatic function panel normal; CBC normal, except Hct 34.7 (35-45)  Labs 06/14/17: CBG 166  Labs 06/08/17: TSH 1.80, free T4 1.3, free T3 2.3; CMP normal except for glucose 115 and creatinine 1.27; PTH 120, calcium 9.7, 25-OH vitamin D 34;cholesterol 188, triglycerides 110, HDL 67, LDL 100; urine microalbumin/creatinine ratio   Labs 04/12/17: HbA1c 10.0%, CBG 174  Labs 03/04/17: Sodium 131, potassium 4.0, chloride 99, CO2 20, glucose 515, calcium 8.9  Labs 01/11/17: CBG 198  Labs 11/24/16: CBG 468; urine glucose 2000+, negative ketones  Labs 11/17/16: HbA1c 13.4%; TSH 1.54; cholesterol 173, triglycerides 91, HDL 71, LDL 83; 25-OH vitamin D 21; CMP normal except for glucose 314; CBC normal except MCHC 26.5 (ref 27-33)   06/04/16: HBA1c 8.1%, CBG 115  05/27/16: TSH 2.62, free T4 1.1, free T3 2.3; CMP normal except for glucose 161, and creatinine 1.09; cholesterol 187, triglycerides 77, HDL 74, LDL 98; urinary microalbumin/creatinine ratio 6; PTH 112, calcium 9.2, 25-OH vitamin D 33  03/17/16: CBG 145  Labs 12/31/15: HbA1c 13.4%, CBG 390; CMP normal except for glucose 344 and creatinine 1.14  Labs 10/08/15: HbA1c 9.6%  Labs 07/02/15: HbA1c 8.9%; PTH 107, calcium 9.2, 25-OH vitamin D 33; CBC normal; CMP normal except for creatinine 1.07; cholesterol 174, triglycerides 91, HDL 74, LDL 82; TSH 2.43, free T4 1.2, free T3 2.3  Labs 02/19/15: PTH 99, calcium 8.7, 25-OH vitamin D 20  Labs 10/08/14: HbA1c 8.0%   Labs 08/06/14: HbA1c 7.4%  Labs 08/01/14: TSH 2.014, free T4 0.87, free T3 2.4; PTH 79 (normal 14-64), calcium  9.1; 25-OH vitamin D 35 CMP normal  Labs 05/11/14: HbA1c was 7.7%, without any low BGs; TSH 2.416, free T4 1.13, free T3 2.3  Labs 02/22/14: HbA1c 11.6%  Labs 02/13/14: Calcium 9.0, PTH 131, 25-OH vitamin D 15;  CMP normal except for glucose of 165; cholesterol 177, triglycerides 108, HDL 90, LDL 65; urinary microalbumin/creatinine ratio 15.6; TSH 3.639, free T4 1.30, free T3 2.5  Labs 10/10/13: HbA1c is 9.4%, compared with 8.1% at last visit and with 9.3% in December;   Labs 05/08/13: HbA1c 8.1%; PTH 90.2, calcium 9.5, 25-hydroxy vitamin D 46; CMP normal except for glucose of 215; WBC 8.0, RBC 5.20, Hgb 12.5, Hct 37.8%, MCV 72.7 (normal 78-100), iron 55  Labs 02/23/13: CMP normal, except glucose 194 and alkaline phosphatase 126; microalbumin/creatinine ratio was 11.5; TSH 2.086, free T4 1.29, free T3 3.2, TPO antibody < 10; 25-hydroxy vitamin D 18; C-peptide 1.79; cholesterol 156, triglycerides 64, HDL 61, LDL 82  Labs 01/05/13: Hemoglobin A1c was 9.3%, compared with 9.3 at last visit and with 9.1% at the visit prior. All of these values were major increases from 6.6% in May 2012.    Labs 04/14/12: 25-hydroxy vitamin D 21, iron 203,  Vitamin B12 379,   Labs 11/16/11: Hgb 10.3, Hct 32.4%; CMP normal except glucose 146' cholesterol 201, triglycerides 70, HDL 66, LDL 121; vitamin D 22           Labs 06/09/10: Cholesterol was 175, triglycerides 76, HDL 62, LDL 98. TSH was 1.217. Free T4 was 1.10. Free T3 was 2.7. 25-hydroxy vitamin D was 19. PTH was 167.  IMAGING:  BMD 09/08/19: T-score of forearm radius is -3.8, c/w osteoporosis.    Assessment and Plan:   ASSESSMENT:  1. Type 2 diabetes mellitus:   A. During the past three years her T2DM control has waxed and waned, with HbA1c values varying from 6.9-13.4%.    B. At today's visit she has lost another 11 pounds. Her HbA1c has decreased to 7.0%.     C. My goal is to reduce her HbA1c to <6.5% if we can safely do so.  D. If she will just take her medications and exercise, she can probably  reach that goal.  2. Hypoglycemia: She has had some episodes of shakiness which could have been due to hypoglycemia, to anxiety, or to some other cause.   3. Obesity:  Her weight has decreased 11 pounds since her last visit. She wants to convert to Ozempic. She needs to take her medications, continue to Eat Right and exercise daily.  4-5. Autonomic neuropathy and tachycardia:  A. The patient's autonomic neuropathy and  heart rate improved as her BGs decreased, but worsened as her BGs increased.  B. Although her HbA1c is lower, her heart rate is higher. This change may be due to anxiety or to being severely deconditioned.  6. Hypertension: Her BP is normal today. She still needs to try to fit in more exercise and take her medications.  7-8. Anhedonia and anxiety: She is doing better since starting Cymbalta. She still needs to call for help when she begins to do poorly. She also needs to continue to find ways to have more joy in her life. 9-12. GERD/esophagitis/difficulty swallowing/gastroparesis: Her reflux, other GI symptoms, and difficulty swallowing have all improved.   13-15: Hypocalcemia, vitamin D deficiency, and secondary hyperparathyroidism:   A. Due to her bariatric surgery she has more difficulty absorbing vitamin D and calcium. When she develops hypocalcemia and vitamin D deficiency,  her parathyroid glands appropriately produce more PTH, resulting in secondary hyperparathyroidism. However, if she takes her calcium and vitamin D all three parameters will normalize.   B. Her calcium was normal in October 2018, but at the lower limit of normal in February 2019. Her vitamin D level was low-normal in May 2019.   C. In May 2019 her PTH was elevated to 120 and her calcium was 9.7. She still had secondary hyperparathyroidism. In October 2019 her calcium was at the low end of the reference range and her PTH was still elevated, but lower.  D. Her PTH level in August 2021 was mid-normal. Her calcium was at the low end of the reference range, because she was taking less calcium than she had intended. Her vitamin D level was good. She needed to increase her calcium intake  to 600 mg, three times daily and take her vitamin D and calcium very consistently.   E. Since her last visit she stopped taking calcium and vitamin D again. She needs to resume taking the calcium and vitamin D. Two Tums per day will probably be adequate.  96. Noncompliance: Since last visit her compliance with eating right and taking her medications has generally been worse.   17: Muscle cramps: Her cramps occur less frequently now.  18. Hypothyroidism: Her TFTs in May 2019 and in December 2020 were normal. Her TSH in April 2022 was normal. Her current levothyroxine dosage is working well if she takes it. 19. Sleeping difficulties: These problems may have worsened. I suspect that her anxiety is causing much of these problems.  20. Osteopenia/osteoporosis: Alexa [name]'s secondary hyperparathyroidism had normalized. She had a new BMD study in August 2021. Her osteoporosis was somewhat worse. She needs to take her calcium and vitamin D.   PLAN:  1. Diagnostic: I reviewed her lab results from September 2022 and January 2023. I ordered TFTs, CMP, PTH, 25-OH vitamin D, CBC, iron..    2. Therapeutic: Resume 25 mcg Synthroid at bedtime, 600 mg of calcium at mealtimes, [employer], 50,000 IU per week, plus 2000 IU daily, Coreg, Micardis.  Try to fit in exercise daily.  Eat meals that are relatively high in protein and relatively low in carbs. Eat Right Diet. Start Ozempic, 1 mg, sq weekly. Check BGs 1-2 times er day.  3. Patient education: We discussed all of the above at great length. She must continue to take her medications and supplements. She also needs to see me every 2 months for now. When she knows that she is coming to see "[facility]", her compliance sometimes improves.  4. Follow-up:  2 months   Level of Service: This visit lasted in excess of 55 minutes. More than 50% of the visit was devoted to counseling.   Tillman Sers, MD, CDE Adult and Pediatric Endocrinology 02/05/2021 10:25  AM

## 2021-02-05 ENCOUNTER — Other Ambulatory Visit: Payer: Self-pay

## 2021-02-05 ENCOUNTER — Encounter (INDEPENDENT_AMBULATORY_CARE_PROVIDER_SITE_OTHER): Payer: Self-pay | Admitting: Family Medicine

## 2021-02-05 ENCOUNTER — Ambulatory Visit: Payer: 59 | Admitting: Adult Health

## 2021-02-05 ENCOUNTER — Other Ambulatory Visit (HOSPITAL_COMMUNITY): Payer: Self-pay

## 2021-02-05 ENCOUNTER — Encounter (INDEPENDENT_AMBULATORY_CARE_PROVIDER_SITE_OTHER): Payer: Self-pay | Admitting: "Endocrinology

## 2021-02-05 ENCOUNTER — Telehealth (INDEPENDENT_AMBULATORY_CARE_PROVIDER_SITE_OTHER): Payer: Self-pay

## 2021-02-05 ENCOUNTER — Ambulatory Visit (INDEPENDENT_AMBULATORY_CARE_PROVIDER_SITE_OTHER): Payer: 59 | Admitting: Family Medicine

## 2021-02-05 ENCOUNTER — Ambulatory Visit (INDEPENDENT_AMBULATORY_CARE_PROVIDER_SITE_OTHER): Payer: 59 | Admitting: "Endocrinology

## 2021-02-05 ENCOUNTER — Encounter: Payer: Self-pay | Admitting: Adult Health

## 2021-02-05 ENCOUNTER — Ambulatory Visit (INDEPENDENT_AMBULATORY_CARE_PROVIDER_SITE_OTHER): Payer: 59 | Admitting: Adult Health

## 2021-02-05 VITALS — BP 108/62 | HR 102 | Wt 142.0 lb

## 2021-02-05 VITALS — BP 101/69 | HR 105 | Temp 97.4°F | Ht 63.0 in | Wt 138.0 lb

## 2021-02-05 DIAGNOSIS — F331 Major depressive disorder, recurrent, moderate: Secondary | ICD-10-CM | POA: Diagnosis not present

## 2021-02-05 DIAGNOSIS — E1140 Type 2 diabetes mellitus with diabetic neuropathy, unspecified: Secondary | ICD-10-CM

## 2021-02-05 DIAGNOSIS — E663 Overweight: Secondary | ICD-10-CM

## 2021-02-05 DIAGNOSIS — E1142 Type 2 diabetes mellitus with diabetic polyneuropathy: Secondary | ICD-10-CM | POA: Diagnosis not present

## 2021-02-05 DIAGNOSIS — E1122 Type 2 diabetes mellitus with diabetic chronic kidney disease: Secondary | ICD-10-CM | POA: Diagnosis not present

## 2021-02-05 DIAGNOSIS — F411 Generalized anxiety disorder: Secondary | ICD-10-CM

## 2021-02-05 DIAGNOSIS — E782 Mixed hyperlipidemia: Secondary | ICD-10-CM

## 2021-02-05 DIAGNOSIS — R4584 Anhedonia: Secondary | ICD-10-CM

## 2021-02-05 DIAGNOSIS — E211 Secondary hyperparathyroidism, not elsewhere classified: Secondary | ICD-10-CM | POA: Diagnosis not present

## 2021-02-05 DIAGNOSIS — E559 Vitamin D deficiency, unspecified: Secondary | ICD-10-CM | POA: Diagnosis not present

## 2021-02-05 DIAGNOSIS — E049 Nontoxic goiter, unspecified: Secondary | ICD-10-CM

## 2021-02-05 DIAGNOSIS — E1143 Type 2 diabetes mellitus with diabetic autonomic (poly)neuropathy: Secondary | ICD-10-CM | POA: Diagnosis not present

## 2021-02-05 DIAGNOSIS — Z6824 Body mass index (BMI) 24.0-24.9, adult: Secondary | ICD-10-CM

## 2021-02-05 DIAGNOSIS — N1831 Chronic kidney disease, stage 3a: Secondary | ICD-10-CM | POA: Diagnosis not present

## 2021-02-05 DIAGNOSIS — F418 Other specified anxiety disorders: Secondary | ICD-10-CM | POA: Diagnosis not present

## 2021-02-05 DIAGNOSIS — F4310 Post-traumatic stress disorder, unspecified: Secondary | ICD-10-CM

## 2021-02-05 DIAGNOSIS — R5383 Other fatigue: Secondary | ICD-10-CM

## 2021-02-05 DIAGNOSIS — M6284 Sarcopenia: Secondary | ICD-10-CM | POA: Diagnosis not present

## 2021-02-05 DIAGNOSIS — D508 Other iron deficiency anemias: Secondary | ICD-10-CM

## 2021-02-05 DIAGNOSIS — G4700 Insomnia, unspecified: Secondary | ICD-10-CM

## 2021-02-05 DIAGNOSIS — E063 Autoimmune thyroiditis: Secondary | ICD-10-CM

## 2021-02-05 DIAGNOSIS — E11649 Type 2 diabetes mellitus with hypoglycemia without coma: Secondary | ICD-10-CM | POA: Diagnosis not present

## 2021-02-05 LAB — POCT GLUCOSE (DEVICE FOR HOME USE): POC Glucose: 151 mg/dl — AB (ref 70–99)

## 2021-02-05 LAB — POCT GLYCOSYLATED HEMOGLOBIN (HGB A1C): Hemoglobin A1C: 7 % — AB (ref 4.0–5.6)

## 2021-02-05 MED ORDER — ALPRAZOLAM 1 MG PO TABS
1.0000 mg | ORAL_TABLET | Freq: Two times a day (BID) | ORAL | 3 refills | Status: DC
  Filled 2021-02-05: qty 60, 30d supply, fill #0

## 2021-02-05 MED ORDER — TRAZODONE HCL 50 MG PO TABS
50.0000 mg | ORAL_TABLET | Freq: Every day | ORAL | 2 refills | Status: DC
  Filled 2021-02-05: qty 30, 30d supply, fill #0
  Filled 2021-03-11: qty 30, 30d supply, fill #1
  Filled 2021-04-13: qty 30, 30d supply, fill #2

## 2021-02-05 MED ORDER — DULOXETINE HCL 60 MG PO CPEP
60.0000 mg | ORAL_CAPSULE | Freq: Every day | ORAL | 3 refills | Status: DC
  Filled 2021-02-05: qty 90, 90d supply, fill #0
  Filled 2021-06-16: qty 90, 90d supply, fill #1
  Filled 2021-10-23: qty 90, 90d supply, fill #2

## 2021-02-05 MED ORDER — OZEMPIC (1 MG/DOSE) 4 MG/3ML ~~LOC~~ SOPN
1.0000 mg | PEN_INJECTOR | SUBCUTANEOUS | 1 refills | Status: DC
  Filled 2021-02-05: qty 3, 28d supply, fill #0
  Filled 2021-03-11: qty 3, 28d supply, fill #1

## 2021-02-05 NOTE — Patient Instructions (Addendum)
Follow up visit in 2 months.  ? ?At [facility], we are committed to providing exceptional care. You will receive a patient satisfaction survey through text or email regarding your visit today. Your opinion is important to me. Comments are appreciated. ? ?

## 2021-02-05 NOTE — Telephone Encounter (Signed)
Message from Plan Member has an active PA on file which is expiring on 09/02/2021 and has 6 no. of fills remaining.

## 2021-02-05 NOTE — Progress Notes (Signed)
Alexa [name] [id number] August 02, 1960 61 y.o.  Subjective:   Patient ID:  Alexa [name] is a 61 y.o. (DOB [birth year]) female.  Chief Complaint: No chief complaint on file.   HPI Alexa [name] presents to the office today for follow-up of MDD, GAD, insomnia and PTSD.   Referred by therapist - [facility]  Describes mood today as "ok". Pleasant. Denies tearfulness. Mood symptoms - reports decreased depression, anxiety, and irritability. Stating "I feel much better". Feels like the addition of Cymbalta has been helpful. Seeing therapist [facility] [employer]. Stable interest and motivation. Taking medications as prescribed.  Energy levels stable. Active, does not have a regular exercise routine.   Enjoys some usual interests and activities. Married x 21 years. Lives with husband and 3 cats. Spending time with family and friends. Appetite adequate. Weight loss 138 from - 147 pounds. Sleeping difficulties. Averages 6 hours with 2mg  of Xanax. Would like to try the Trazadone and work on weaning off of the Xanax.  Focus and concentration stable. Completing tasks. Managing aspects of household. Has worked as a Marine scientist for 36 years. Denies SI or HI.  Denies AH or VH. Multiple health issues.  Previous medication trials:  Lexapro, Ambien,    PHQ2-9    Flowsheet Row Office Visit from 11/28/2020 in [facility] Office Visit from 05/16/2020 in [facility] Office Visit from 03/31/2019 in [facility] Office Visit from 12/13/2017 in [facility] Office Visit from 11/16/2017 in [facility]  PHQ-2 Total Score 1 4 0 2 5  PHQ-9 Total Score 7 5 -- 6 17        Review of Systems:  Review of Systems  Musculoskeletal:  Negative for gait problem.  Neurological:  Negative for tremors.  Psychiatric/Behavioral:         Please refer to HPI   Medications: I have reviewed the patient's current medications.  Current Outpatient Medications  Medication Sig Dispense  Refill   traZODone (DESYREL) 50 MG tablet Take 1 tablet (50 mg total) by mouth at bedtime. 30 tablet 2   albuterol (PROVENTIL) (2.5 MG/3ML) 0.083% nebulizer solution Take 3 mLs (2.5 mg total) by nebulization every 6 (six) hours as needed for wheezing or shortness of breath. 150 mL 1   albuterol (VENTOLIN HFA) 108 (90 Base) MCG/ACT inhaler Inhale 2 puffs into the lungs every 6 (six) hours as needed for wheezing or shortness of breath. 6.7 g 11   betamethasone valerate ointment (VALISONE) 0.1 % Apply 1 application topically 2 (two) times daily. 30 g 1   Blood Glucose Monitoring Suppl (FREESTYLE LITE) DEVI Use to check glucose 3x daily 2 each 5   calcium-vitamin D (OSCAL WITH D) 500-200 MG-UNIT TABS tablet Take by mouth.     carvedilol (COREG) 3.125 MG tablet TAKE 1 TABLET BY MOUTH TWO TIMES DAILY WITH A MEAL 120 tablet 10   Cholecalciferol 50 MCG (2000 UT) CAPS Take 1 capsule by mouth daily.     conjugated estrogens (PREMARIN) vaginal cream Place 1 applicatorful vaginally at bedtime for 21 days then off for 7 days. 30 g 11   Cyanocobalamin (VITAMIN B 12) 100 MCG LOZG Take by mouth.     cyclobenzaprine (FLEXERIL) 10 MG tablet Take 1 tablet (10 mg total) by mouth 3 (three) times daily as needed for muscle spasms. 90 tablet 0   doxycycline (VIBRA-TABS) 100 MG tablet Take 1 tablet (100 mg total) by mouth 2 (two) times daily. [place]  tablet 0   DULoxetine (CYMBALTA) 60 MG capsule Take 1 capsule (60 mg total) by mouth daily. 90 capsule 3   Efinaconazole 10 % SOLN Apply 1 drop topically daily. 4 mL 11   Ferrous Sulfate (SLOW FE PO) Take 1 tablet by mouth daily.      glucose blood (FREESTYLE LITE) test strip Use 1 to check blood glucose 3 times daily. 300 each 1   ibuprofen (ADVIL,MOTRIN) 200 MG tablet Take 600 mg by mouth daily as needed for headache or moderate pain.     levothyroxine (SYNTHROID) 25 MCG tablet TAKE 1 TABLET BY MOUTH ONCE DAILY 90 tablet 1   metFORMIN (GLUCOPHAGE) 1000 MG tablet TAKE 1 TABLET  BY MOUTH 2 TIMES DAILY WITH A MEAL. 180 tablet 0   Multiple Vitamin (MULTIVITAMIN) tablet Take 1 tablet by mouth daily. Reported on 02/26/2015 30 tablet 0   ondansetron (ZOFRAN) 4 MG tablet Take 1 tablet (4 mg total) by mouth every 8 (eight) hours as needed for nausea or vomiting. 20 tablet 0   pantoprazole (PROTONIX) 40 MG tablet TAKE 1 TABLET (40 MG TOTAL) BY MOUTH DAILY. 90 tablet 4   Semaglutide, 1 MG/DOSE, (OZEMPIC, 1 MG/DOSE,) 4 MG/3ML SOPN Inject 1 mg into the skin once a week. 3 mL 1   telmisartan (MICARDIS) 40 MG tablet TAKE 1/2 TABLET BY MOUTH TWO TIMES DAILY 90 tablet 3   Vitamin D, Ergocalciferol, (DRISDOL) 50000 units CAPS capsule Take 50,000 Units by mouth every Sunday.     No current facility-administered medications for this visit.    Medication Side Effects: None  Allergies:  Allergies  Allergen Reactions   Theophyllines Palpitations    Past Medical History:  Diagnosis Date   Allergy    Anemia, iron deficiency    Anhedonia    Anxiety    Asthma    Asthma, chronic    Back pain    CAD (coronary artery disease)    Cervical spondylosis 06/13/2020   Chewing difficulty    Chronic insomnia    Chronic kidney disease    CKD stage 3 A    Combined hyperlipidemia    Constipation    Depression    Diabetes mellitus    Diabetes mellitus type II    Diabetic autonomic neuropathy (HCC)    Difficulty swallowing pills    DM type 2 with diabetic peripheral neuropathy (HCC)    Dry mouth    Dyspepsia    Dyspnea    Elevated homocysteine    Essential tremor    Fatigue    Fatty liver    GERD (gastroesophageal reflux disease)    GERD (gastroesophageal reflux disease)    Glossitis    Goiter    Hemorrhoids    HLD (hyperlipidemia)    Hyperparathyroidism , secondary, non-renal (HCC)    Hypertension    Hypocalcemia    Insomnia    Leg edema    Leg pain    Macular degeneration    Nonproliferative diabetic retinopathy associated with type 2 diabetes mellitus (HCC)     RESOLVED   Obesity, Class III, BMI 40-49.9 (morbid obesity) (HCC)    Osteopenia    Osteoporosis    Pallor    PONV (postoperative nausea and vomiting)    Status post gastric surgery    Tachycardia    Thyroiditis, autoimmune    Vaginal dryness, menopausal    Vitamin D deficiency     Past Medical History, Surgical history, Social history, and Family history were reviewed and updated  as appropriate.   Please see review of systems for further details on the patient's review from today.   Objective:   Physical Exam:  LMP 05/11/2010   Physical Exam Constitutional:      General: She is not in acute distress. Musculoskeletal:        General: No deformity.  Neurological:     Mental Status: She is alert and oriented to person, place, and time.     Coordination: Coordination normal.  Psychiatric:        Attention and Perception: Attention and perception normal. She does not perceive auditory or visual hallucinations.        Mood and Affect: Mood normal. Mood is not anxious or depressed. Affect is not labile, blunt, angry or inappropriate.        Speech: Speech normal.        Behavior: Behavior normal.        Thought Content: Thought content normal. Thought content is not paranoid or delusional. Thought content does not include homicidal or suicidal ideation. Thought content does not include homicidal or suicidal plan.        Cognition and Memory: Cognition and memory normal.        Judgment: Judgment normal.     Comments: Insight intact    Lab Review:     Component Value Date/Time   NA 138 05/16/2020 1050   K 5.0 05/16/2020 1050   CL 104 05/16/2020 1050   CO2 23 05/16/2020 1050   GLUCOSE 135 (H) 05/16/2020 1050   BUN 15 05/16/2020 1050   CREATININE 1.03 05/16/2020 1050   CALCIUM 10.0 05/16/2020 1050   CALCIUM 9.3 08/14/2011 1217   PROT 6.8 05/16/2020 1050   ALBUMIN 4.1 05/27/2016 0828   AST 12 05/16/2020 1050   ALT 11 05/16/2020 1050   ALKPHOS 85 05/27/2016 0828    BILITOT 0.5 05/16/2020 1050   GFRNONAA 59 (L) 05/16/2020 1050   GFRAA 69 05/16/2020 1050       Component Value Date/Time   WBC 7.4 07/23/2020 1604   RBC 4.69 07/23/2020 1604   HGB 12.9 07/23/2020 1604   HCT 40.0 07/23/2020 1604   PLT 407 (H) 07/23/2020 1604   MCV 85.3 07/23/2020 1604   MCH 27.5 07/23/2020 1604   MCHC 32.3 07/23/2020 1604   RDW 13.0 07/23/2020 1604   LYMPHSABS 2,708 07/23/2020 1604   MONOABS 0.8 10/19/2015 1327   EOSABS 111 07/23/2020 1604   BASOSABS 59 07/23/2020 1604    No results found for: POCLITH, LITHIUM   No results found for: PHENYTOIN, PHENOBARB, VALPROATE, CBMZ   .res Assessment: Plan:   Plan:  PDMP reviewed  Cymbalta 60mg  daily Add Trazadone 50mg  at hs Continue Xanax 2mg  at hs - will try tapering off - discussed taper instructions - 2mg  at hs to 1.5 x 2 weeks, then 1 x 2 weeks, and then 0.5 x 2 weeks and then 0.25mg  x two weeks   RTC 4 weeks  Patient advised to contact office with any questions, adverse effects, or acute worsening in signs and symptoms.    Time spent with patient was 60 minutes. Greater than 50% of face to face time with patient was spent on counseling and coordination of care.   Diagnoses and all orders for this visit:  Insomnia, unspecified type -     traZODone (DESYREL) 50 MG tablet; Take 1 tablet (50 mg total) by mouth at bedtime.  PTSD (post-traumatic stress disorder) -     DULoxetine (CYMBALTA) 60  MG capsule; Take 1 capsule (60 mg total) by mouth daily.  Generalized anxiety disorder -     DULoxetine (CYMBALTA) 60 MG capsule; Take 1 capsule (60 mg total) by mouth daily. -     Discontinue: ALPRAZolam (XANAX) 1 MG tablet; Take 1 tablet (1 mg total) by mouth 2 (two) times daily.  Major depressive disorder, recurrent episode, moderate (HCC) -     DULoxetine (CYMBALTA) 60 MG capsule; Take 1 capsule (60 mg total) by mouth daily.     Please see After Visit Summary for patient specific instructions.  Future  Appointments  Date Time Provider [place]  04/02/2021  9:30 AM Sherrlyn Hock, MD [facility]  04/02/2021 11:00 AM Dennard Nip D, MD [facility] None  04/02/2021  4:20 PM Aleene Swanner, Berdie Ogren, NP [facility] None  05/30/2021  2:00 PM Baxley, Cresenciano Lick, MD [facility]    No orders of the defined types were placed in this encounter.   -------------------------------

## 2021-02-05 NOTE — Progress Notes (Signed)
Chief Complaint:   OBESITY Alexa [name] is here to discuss her progress with her obesity treatment plan along with follow-up of her obesity related diagnoses. Alexa [name] is on practicing portion control and making smarter food choices, such as increasing vegetables and decreasing simple carbohydrates and states she is following her eating plan approximately 60% of the time. Alexa [name] states she is doing 0 minutes 0 times per week.  Today's visit was #: 84 Starting weight: 169 lbs Starting date: 11/17/2019 Today's weight: 138 lbs Today's date: 02/05/2021 Total lbs lost to date: 31 lbs Total lbs lost since last in-office visit: 7 lbs  Interim History: Alexa [name] reports energy is very low. She was at her goal weight but has lost another 7 lbs. She is focusing on protein intake but doubts she get 85 grams per day.   Subjective:   1. Type 2 diabetes mellitus with stage 3a chronic kidney disease, without long-term current use of insulin (HCC) Alexa [name] has not been compliant with Metformin but is compliant with Wegovy 2.4. Med non-compliance is a long-standing issue for her. However, she does not need to lose more weight. She will be seeing Dr. Tobe Sos today. Her last A1C was elevated at 7.6 (10/01/20).  She things her A1c will be better today.   Lab Results  Component Value Date   HGBA1C 7.0 (A) 02/05/2021   HGBA1C 7.6 (A) 10/01/2020   HGBA1C 7.5 (H) 05/16/2020   Lab Results  Component Value Date   MICROALBUR 1.8 05/16/2020   LDLCALC 127 (H) 05/16/2020   CREATININE 1.03 05/16/2020   Lab Results  Component Value Date   INSULIN 8.1 11/16/2017    2. Depression with anxiety Alexa [name] notes pain from frozen shoulder. She notes she has severe pain but was recently started on Cymbalta which helped a great deal with pain and anxiety. She is complaint with Cymbalta. She will be seeing a new PMHNP.  3. Sarcopenia Alexa [name]'s muscle mass has decreased from 99.8 lbs (03-14-2020) to 80.4 lbs. She is deficient in  protein intake. She notes weakness and fatigue.   Assessment/Plan:   1. Type 2 diabetes mellitus with stage 3a chronic kidney disease, without long-term current use of insulin (HCC) We will discontinue Wegovy to hopefully help stabilize her weight. Kosha agrees to start Ozempic 1 mg weekly.   - Semaglutide, 1 MG/DOSE, (OZEMPIC, 1 MG/DOSE,) 4 MG/3ML SOPN; Inject 1 mg into the skin once a week.  Dispense: 3 mL; Refill: 1  2. Depression with anxiety Alexa [name] will follow up with PMHNP.  She will work on better med compliance.  3. Sarcopenia Alexa [name] will increase protein to 80 grams per day at least. She will do resistance training 2.3 days per week. She has weights as home.   4. Obesity: Current BMI 24.45 Alexa [name] is currently in the action stage of change. As such, her goal is to continue with weight loss efforts. She has agreed to practicing portion control and making smarter food choices, such as increasing vegetables and decreasing simple carbohydrates.   Alexa [name] was encouraged to have protein bar once daily.  Exercise goals:  Alexa [name] will start resistance training 2-3 days per week.  Behavioral modification strategies: increasing lean protein intake and decreasing simple carbohydrates.  Alexa [name] has agreed to follow-up with our clinic in 8 weeks with Dr. Leafy Ro.  Objective:   Blood pressure 101/69, pulse (!) 105, temperature (!) 97.4 F (36.3 C), height 5\' 3"  (1.6 m), weight 138 lb (62.6 kg), last menstrual period 05/11/2010, SpO2 98 %.  Body mass index is 24.45 kg/m.  General: Cooperative, alert, well developed, in no acute distress. HEENT: Conjunctivae and lids unremarkable. Cardiovascular: Regular rhythm.  Lungs: Normal work of breathing. Neurologic: No focal deficits.   Lab Results  Component Value Date   CREATININE 1.03 05/16/2020   BUN 15 05/16/2020   NA 138 05/16/2020   K 5.0 05/16/2020   CL 104 05/16/2020   CO2 23 05/16/2020   Lab Results  Component Value Date   ALT  11 05/16/2020   AST 12 05/16/2020   ALKPHOS 85 05/27/2016   BILITOT 0.5 05/16/2020   Lab Results  Component Value Date   HGBA1C 7.0 (A) 02/05/2021   HGBA1C 7.6 (A) 10/01/2020   HGBA1C 7.5 (H) 05/16/2020   HGBA1C 8.5 (A) 12/11/2019   HGBA1C 7.2 (A) 09/08/2019   Lab Results  Component Value Date   INSULIN 8.1 11/16/2017   Lab Results  Component Value Date   TSH 2.09 05/16/2020   Lab Results  Component Value Date   CHOL 210 (H) 05/16/2020   HDL 65 05/16/2020   LDLCALC 127 (H) 05/16/2020   TRIG 84 05/16/2020   CHOLHDL 3.2 05/16/2020   Lab Results  Component Value Date   VD25OH 71 03/27/2019   VD25OH 64 11/01/2017   VD25OH 34 06/08/2017   Lab Results  Component Value Date   WBC 7.4 07/23/2020   HGB 12.9 07/23/2020   HCT 40.0 07/23/2020   MCV 85.3 07/23/2020   PLT 407 (H) 07/23/2020   Lab Results  Component Value Date   IRON 55 05/08/2013   TIBC 357 04/14/2012   Attestation Statements:   Reviewed by clinician on day of visit: allergies, medications, problem list, medical history, surgical history, family history, social history, and previous encounter notes.  I, Lizbeth Bark, RMA, am acting as Location manager for [employer], [place].  I have reviewed the above documentation for accuracy and completeness, and I agree with the above. -  Georgianne Fick, FNP

## 2021-02-06 ENCOUNTER — Encounter (INDEPENDENT_AMBULATORY_CARE_PROVIDER_SITE_OTHER): Payer: Self-pay | Admitting: Family Medicine

## 2021-02-06 LAB — CBC WITH DIFFERENTIAL/PLATELET
Absolute Monocytes: 365 cells/uL (ref 200–950)
Basophils Absolute: 29 cells/uL (ref 0–200)
Basophils Relative: 0.5 %
Eosinophils Absolute: 81 cells/uL (ref 15–500)
Eosinophils Relative: 1.4 %
HCT: 38.9 % (ref 35.0–45.0)
Hemoglobin: 12.9 g/dL (ref 11.7–15.5)
Lymphs Abs: 2001 cells/uL (ref 850–3900)
MCH: 27.9 pg (ref 27.0–33.0)
MCHC: 33.2 g/dL (ref 32.0–36.0)
MCV: 84 fL (ref 80.0–100.0)
MPV: 9.7 fL (ref 7.5–12.5)
Monocytes Relative: 6.3 %
Neutro Abs: 3323 cells/uL (ref 1500–7800)
Neutrophils Relative %: 57.3 %
Platelets: 361 10*3/uL (ref 140–400)
RBC: 4.63 10*6/uL (ref 3.80–5.10)
RDW: 13.8 % (ref 11.0–15.0)
Total Lymphocyte: 34.5 %
WBC: 5.8 10*3/uL (ref 3.8–10.8)

## 2021-02-06 LAB — T3, FREE: T3, Free: 2.8 pg/mL (ref 2.3–4.2)

## 2021-02-06 LAB — COMPREHENSIVE METABOLIC PANEL
AG Ratio: 1.9 (calc) (ref 1.0–2.5)
ALT: 29 U/L (ref 6–29)
AST: 25 U/L (ref 10–35)
Albumin: 4.1 g/dL (ref 3.6–5.1)
Alkaline phosphatase (APISO): 91 U/L (ref 37–153)
BUN/Creatinine Ratio: 18 (calc) (ref 6–22)
BUN: 19 mg/dL (ref 7–25)
CO2: 23 mmol/L (ref 20–32)
Calcium: 9.5 mg/dL (ref 8.6–10.4)
Chloride: 104 mmol/L (ref 98–110)
Creat: 1.04 mg/dL — ABNORMAL HIGH (ref 0.50–1.03)
Globulin: 2.2 g/dL (calc) (ref 1.9–3.7)
Glucose, Bld: 130 mg/dL (ref 65–139)
Potassium: 4.5 mmol/L (ref 3.5–5.3)
Sodium: 137 mmol/L (ref 135–146)
Total Bilirubin: 0.5 mg/dL (ref 0.2–1.2)
Total Protein: 6.3 g/dL (ref 6.1–8.1)

## 2021-02-06 LAB — TSH: TSH: 2.29 mIU/L (ref 0.40–4.50)

## 2021-02-06 LAB — IRON: Iron: 73 ug/dL (ref 45–160)

## 2021-02-06 LAB — T4, FREE: Free T4: 1.2 ng/dL (ref 0.8–1.8)

## 2021-02-06 LAB — PTH, INTACT AND CALCIUM
Calcium: 9.5 mg/dL (ref 8.6–10.4)
PTH: 69 pg/mL (ref 16–77)

## 2021-02-06 LAB — VITAMIN D 25 HYDROXY (VIT D DEFICIENCY, FRACTURES): Vit D, 25-Hydroxy: 45 ng/mL (ref 30–100)

## 2021-02-12 ENCOUNTER — Encounter (INDEPENDENT_AMBULATORY_CARE_PROVIDER_SITE_OTHER): Payer: Self-pay

## 2021-02-26 DIAGNOSIS — H353121 Nonexudative age-related macular degeneration, left eye, early dry stage: Secondary | ICD-10-CM | POA: Diagnosis not present

## 2021-02-26 DIAGNOSIS — H52203 Unspecified astigmatism, bilateral: Secondary | ICD-10-CM | POA: Diagnosis not present

## 2021-03-05 DIAGNOSIS — F39 Unspecified mood [affective] disorder: Secondary | ICD-10-CM | POA: Diagnosis not present

## 2021-03-11 ENCOUNTER — Other Ambulatory Visit (HOSPITAL_COMMUNITY): Payer: Self-pay

## 2021-03-11 ENCOUNTER — Other Ambulatory Visit (INDEPENDENT_AMBULATORY_CARE_PROVIDER_SITE_OTHER): Payer: Self-pay | Admitting: "Endocrinology

## 2021-03-11 DIAGNOSIS — I10 Essential (primary) hypertension: Secondary | ICD-10-CM

## 2021-03-11 MED ORDER — CARVEDILOL 3.125 MG PO TABS
3.1250 mg | ORAL_TABLET | Freq: Two times a day (BID) | ORAL | 10 refills | Status: DC
  Filled 2021-03-11: qty 180, 90d supply, fill #0
  Filled 2021-10-23: qty 180, 90d supply, fill #1
  Filled 2022-01-23: qty 180, 90d supply, fill #2

## 2021-03-13 ENCOUNTER — Other Ambulatory Visit (HOSPITAL_COMMUNITY): Payer: Self-pay

## 2021-03-25 ENCOUNTER — Telehealth: Payer: 59 | Admitting: Nurse Practitioner

## 2021-03-25 DIAGNOSIS — J4521 Mild intermittent asthma with (acute) exacerbation: Secondary | ICD-10-CM

## 2021-03-25 MED ORDER — PREDNISONE 20 MG PO TABS: 20.0000 mg | ORAL_TABLET | Freq: Two times a day (BID) | ORAL | 0 refills | Status: AC

## 2021-03-25 MED ORDER — ALBUTEROL SULFATE HFA 108 (90 BASE) MCG/ACT IN AERS: 2.0000 | INHALATION_SPRAY | Freq: Four times a day (QID) | RESPIRATORY_TRACT | 0 refills | Status: DC | PRN

## 2021-03-25 NOTE — Progress Notes (Signed)
Visit for Asthma  Based on what you have shared with me, it looks like you may have a flare up of your asthma.  Asthma is a chronic (ongoing) lung disease which results in airway obstruction, inflammation and hyper-responsiveness.   Asthma symptoms vary from person to person, with common symptoms including nighttime awakening and decreased ability to participate in normal activities as a result of shortness of breath. It is often triggered by changes in weather, changes in the season, changes in air temperature, or inside (home, school, daycare or work) allergens such as animal dander, mold, mildew, woodstoves or cockroaches.   It can also be triggered by hormonal changes, extreme emotion, physical exertion or an upper respiratory tract illness.     It is important to identify the trigger, and then eliminate or avoid the trigger if possible.   If you have been prescribed medications to be taken on a regular basis, it is important to follow the asthma action plan and to follow guidelines to adjust medication in response to increasing symptoms of decreased peak expiratory flow rate  Treatment: I have prescribed: Albuterol (Proventil HFA; Ventolin HFA) 108 (90 Base) MCG/ACT Inhaler 2 puffs into the lungs every six hours as needed for wheezing or shortness of breath and Prednisone 20mg  by mouth twice daily for 5 days  HOME CARE Only take medications as instructed by your medical team. Consider wearing a mask or scarf to improve breathing air temperature have been shown to decrease irritation and decrease exacerbations Get rest. Taking a steamy shower or using a humidifier may help nasal congestion sand ease sore throat pain. You can place a towel over your head and breathe in the steam from hot water coming from a faucet. Using a saline nasal spray works much the same way.  Cough drops, hare candies and sore throat  lozenges may ease your cough.  Avoid close contacts especially the very you and the elderly Cover your mouth if you cough or sneeze Always remember to wash your hands.    GET HELP RIGHT AWAY IF: You develop worsening symptoms; breathlessness at rest, drowsy, confused or agitated, unable to speak in full sentences You have coughing fits You develop a severe headache or visual changes You develop shortness of breath, difficulty breathing or start having chest pain Your symptoms persist after you have completed your treatment plan If your symptoms do not improve within 10 days  MAKE SURE YOU Understand these instructions. Will watch your condition. Will get help right away if you are not doing well or get worse.   Your e-visit answers were reviewed by a board certified advanced clinical practitioner to complete your personal care plan, Depending upon the condition, your plan could have included both over the counter or prescription medications.   Please review your pharmacy choice. Your safety is important to [name]. If you have drug allergies check your prescription carefully.  You can use MyChart to ask questions about today's visit, request a non-urgent  call back, or ask for a work or school excuse for 24 hours related to this e-Visit. If it has been greater than 24 hours you will need to follow up with your provider, or enter a new e-Visit to address those concerns.   You will get an e-mail in the next two days asking about your experience. I hope that your e-visit has been valuable and will speed your recovery. Thank you for using e-visits.   I spent approximately 7 minutes reviewing the patient's history,  current symptoms and coordinating their plan of care today.    Meds ordered this encounter  Medications   albuterol (VENTOLIN HFA) 108 (90 Base) MCG/ACT inhaler    Sig: Inhale 2 puffs into the lungs every 6 (six) hours as needed for wheezing or shortness of breath.    Dispense:  8  g    Refill:  0   predniSONE (DELTASONE) 20 MG tablet    Sig: Take 1 tablet (20 mg total) by mouth 2 (two) times daily with a meal for 5 days.    Dispense:  10 tablet    Refill:  0

## 2021-03-26 ENCOUNTER — Telehealth: Payer: Self-pay | Admitting: Internal Medicine

## 2021-03-26 NOTE — Telephone Encounter (Signed)
Alexa [name] ?[phone] ? ?Alexa [name] called to say she is having congestion, cough, tightness in chest, chills, fever, and body aches, they were exposed to [place] at church on Sunday and her husband tested positive yesterday. She tested negative yesterday, I have ask her to test in the morning and call me. I set her up for a video visit at 12:00 tomorrow  ?

## 2021-03-27 ENCOUNTER — Other Ambulatory Visit: Payer: Self-pay

## 2021-03-27 ENCOUNTER — Telehealth (INDEPENDENT_AMBULATORY_CARE_PROVIDER_SITE_OTHER): Payer: 59 | Admitting: Internal Medicine

## 2021-03-27 ENCOUNTER — Encounter: Payer: Self-pay | Admitting: Internal Medicine

## 2021-03-27 ENCOUNTER — Telehealth: Payer: Self-pay | Admitting: Gastroenterology

## 2021-03-27 VITALS — BP 144/80 | Temp 98.8°F

## 2021-03-27 DIAGNOSIS — U071 COVID-19: Secondary | ICD-10-CM | POA: Diagnosis not present

## 2021-03-27 DIAGNOSIS — J4521 Mild intermittent asthma with (acute) exacerbation: Secondary | ICD-10-CM

## 2021-03-27 DIAGNOSIS — E1122 Type 2 diabetes mellitus with diabetic chronic kidney disease: Secondary | ICD-10-CM | POA: Diagnosis not present

## 2021-03-27 DIAGNOSIS — J22 Unspecified acute lower respiratory infection: Secondary | ICD-10-CM

## 2021-03-27 DIAGNOSIS — N1831 Chronic kidney disease, stage 3a: Secondary | ICD-10-CM

## 2021-03-27 MED ORDER — FLUCONAZOLE 150 MG PO TABS: 150.0000 mg | ORAL_TABLET | Freq: Once | ORAL | 0 refills | Status: AC

## 2021-03-27 MED ORDER — AZITHROMYCIN 250 MG PO TABS: ORAL_TABLET | ORAL | 0 refills | Status: AC

## 2021-03-27 MED ORDER — BENZONATATE 100 MG PO CAPS: 100.0000 mg | ORAL_CAPSULE | Freq: Three times a day (TID) | ORAL | 0 refills | Status: DC | PRN

## 2021-03-27 NOTE — Telephone Encounter (Signed)
Patient called said she has a cold no fever and also tested positive for Covid -19 yesterday. 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 ?

## 2021-03-27 NOTE — Progress Notes (Signed)
? ?  Subjective:  ? ? Patient ID: Alexa [name], female    DOB: January 14, 1961, 61 y.o.   MRN: [id number] ? 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  Had an E-visit with nurse practitioner on February 28.  Was given oral steroids consisting of prednisone 20 mg twice daily for 5 days.  Patient has a history of asthma and uses albuterol inhaler every 6 hours as needed for wheezing. ? ?She has a history of anxiety and depression.  She has a history of type 2 diabetes mellitus.  Diabetes is followed by Dr. Tobe Sos. ? ?Due to the Coronavirus pandemic she is seen by interactive audio and video telecommunications today.  She is identified using 2 identifiers as Alexa [name], a patient in this practice.  She is at her home and I am at my office.  She is agreeable to visit in this format today.  History of mild intermittent asthma treated with albuterol. ? ?Complaining of cough, congestion, tightness in chest, chills, low-grade fever and myalgias. ? ?Review of Systems no nausea, vomiting, diarrhea, wheezing or shortness of breath.  Beginning to bring up some discolored sputum. ? ?   ?Objective:  ? Physical Exam ?Blood pressure reported to be 144/80 temperature 98.8 degrees she does not sound short of breath when she speaks.  No audible wheezing noted. ? ? ? ?   ?Assessment & Plan:  ?Acute COVID-19 virus infection  ? ?Acute lower respiratory infection-likely developing secondary bacterial infection ? ?Type 2 diabetes mellitus-continue to monitor Accu-Cheks ? ?Anxiety ? ?Plan: We will be happy to provide FMLA form completion if she needs it for work. ?We talked about the pros and cons of Paxlovid.  I think the best course of action now would be to cover for secondary bacterial infection with Zithromax Z-PAK 2 tabs day 1 followed by 1 tab days 2 through 5.  Diflucan 150 mg tablet to take 1 tablet by mouth for 1 dose to treat Candida vaginitis if needed.  Tessalon  Perles 100 mg 3 times a day as needed for cough.  Continue using Ventolin inhaler.  Continue prednisone taper given by nurse practitioner during ED visit.  This is prednisone 20 mg to take 2 times daily with a meal for 5 days.  Monitor Accu-Cheks. ? ?Walk some to prevent atelectasis.  Monitor pulse oximetry.  Stay well-hydrated. ? ?Time spent interviewing patient, reviewing chart and E scribing medications is 20 minutes ?

## 2021-03-27 NOTE — Telephone Encounter (Signed)
COVID Positive ?

## 2021-03-27 NOTE — Telephone Encounter (Signed)
Per [place] covid policy patient needs to reschedule. Patient notified-she will call us back next week when she gets her work schedule. ?

## 2021-03-27 NOTE — Patient Instructions (Signed)
Monitor pulse oximetry.  Walk some to prevent atelectasis.  Stay well-hydrated.  Continue prednisone as prescribed on the visit.  Monitor Accu-Cheks.  Take Zithromax Z-PAK 2 tabs day 1 followed by 1 tab days 2 through 5.  May take Diflucan 150 mg tablet if develop symptoms of Candida vaginitis while taking steroids and antibiotics.  Tessalon Perles 100 mg 3 times a day as needed for cough.  Continue Ventolin inhaler.  Call if symptoms worsen or not improving. ?

## 2021-04-01 ENCOUNTER — Encounter: Payer: 59 | Admitting: Gastroenterology

## 2021-04-01 NOTE — Progress Notes (Signed)
?TeleHealth Visit:  ?Due to the COVID-19 pandemic, this visit was completed with telemedicine (audio/video) technology to reduce patient and provider exposure as well as to preserve personal protective equipment.  ? ?Alexa [name] has verbally consented to this TeleHealth visit. The patient is located at home, the provider is located at home. The participants in this visit include the listed provider and patient. The visit was conducted today via MyChart video. ? ?OBESITY ?Alexa [name] is here to discuss her progress with her obesity treatment plan along with follow-up of her obesity related diagnoses.  ? ?Today's date: 04/01/2021 ?Today's visit was #: 52 ?Starting weight: 169 lbs ?Starting date: 11/16/17 ?Today's reported weight: 134 lbs ?Weight change since last visit: -8 ?Reported Vital Signs:none ? ? ?Interim History: Alexa [name] is currently sick with COVID.  She has lost 8 pounds since her last office visit but has not really been trying.  Her endocrinologist Alexa [name] has set a goal weight of 150 pounds.  She has not been tracking her protein but says that she does try to concentrate on eating meat first in a meal.  She reports she is sleeping well which is notable for her because she has chronic insomnia. ?Water intake is poor-only 32 ounces per day. ? ?Nutrition Plan: practicing portion control and making smarter food choices, such as increasing vegetables and decreasing simple carbohydrates.  ?Anti-obesity medications: Ozempic 1 mg weekly. Reported side effects: None. ?Hunger is well controlled. Cravings are moderately controlled. Craves candy. Has it occasionally.  ?Activity: works 2 days a week.  No formal exercise. ?Stress: She feels stress is well controlled at this point.  She notes that they are always short staffed at work. She is a Writer at [employer].. ? ? ?Assessment/Plan:  ?Type II Diabetes with CKD 3a ?Assessment: Blood sugars are running 90-130s.  She denies hypoglycemia.  Alexa [name] wants her  A1c to be 6. ?Medication(s): Ozempic 1 mg weekly ?HgbA1c is not at goal. ?Lab Results  ?Component Value Date  ? HGBA1C 7.0 (A) 02/05/2021  ? ?Plan: ?Continue Ozempic at 1 mg weekly.  Follow-up with Alexa [name] as directed.  Reduce simple carbohydrates in diet. ?Refill Ozempic at 1 mg weekly. ? ?Depression with anxiety ?[name] feels depression and anxiety are currently well controlled.  She reports that her mood is much better since starting the Cymbalta.  She has been weaned off of Xanax. ? ? ?Obesity: Current BMI 25.16 ?Alexa [name] is currently in the action stage of change. As such, her goal is to maintain weight for now. She has agreed to practicing portion control and making smarter food choices, such as increasing vegetables and decreasing simple carbohydrates.  ?I encouraged her to track her protein with a goal of 80 g daily. ? ?Exercise goals: We discussed the importance of building muscle and I encouraged her to do resistance training 2 to 3 days/week.  She generally does chair exercises. ? ?Behavioral modification strategies: increasing lean protein intake, decreasing simple carbohydrates, increasing water intake, and keeping healthy foods in the home. ? ?Alexa [name] has agreed to follow-up with our clinic in 8 weeks.  ?She does not have her work schedule and will call back to schedule the appointment. ?Objective:  ? ?VITALS: Per patient if applicable, see vitals. ?GENERAL: Alert and in no acute distress. ?CARDIOPULMONARY: No increased WOB. Speaking in clear sentences.  ?PSYCH: Pleasant and cooperative. Speech normal rate and rhythm. Affect is appropriate. Insight and judgement are appropriate. Attention is focused, linear, and appropriate.  ?NEURO: Oriented as arrived to  appointment on time with no prompting.  ? ?Lab Results  ?Component Value Date  ? CREATININE 1.04 (H) 02/05/2021  ? BUN 19 02/05/2021  ? NA 137 02/05/2021  ? K 4.5 02/05/2021  ? CL 104 02/05/2021  ? CO2 23 02/05/2021  ? ?Lab Results  ?Component Value  Date  ? ALT 29 02/05/2021  ? AST 25 02/05/2021  ? ALKPHOS 85 05/27/2016  ? BILITOT 0.5 02/05/2021  ? ?Lab Results  ?Component Value Date  ? HGBA1C 7.0 (A) 02/05/2021  ? HGBA1C 7.6 (A) 10/01/2020  ? HGBA1C 7.5 (H) 05/16/2020  ? HGBA1C 8.5 (A) 12/11/2019  ? HGBA1C 7.2 (A) 09/08/2019  ? ?Lab Results  ?Component Value Date  ? INSULIN 8.1 11/16/2017  ? ?Lab Results  ?Component Value Date  ? TSH 2.29 02/05/2021  ? ?Lab Results  ?Component Value Date  ? CHOL 210 (H) 05/16/2020  ? HDL 65 05/16/2020  ? LDLCALC 127 (H) 05/16/2020  ? TRIG 84 05/16/2020  ? CHOLHDL 3.2 05/16/2020  ? ?Lab Results  ?Component Value Date  ? WBC 5.8 02/05/2021  ? HGB 12.9 02/05/2021  ? HCT 38.9 02/05/2021  ? MCV 84.0 02/05/2021  ? PLT 361 02/05/2021  ? ?Lab Results  ?Component Value Date  ? IRON 73 02/05/2021  ? TIBC 357 04/14/2012  ? ?Lab Results  ?Component Value Date  ? VD25OH 45 02/05/2021  ? VD25OH 71 03/27/2019  ? VD25OH 64 11/01/2017  ? ? ?Attestation Statements:  ? ?Reviewed by clinician on day of visit: allergies, medications, problem list, medical history, surgical history, family history, social history, and previous encounter notes. ? ? ?

## 2021-04-02 ENCOUNTER — Ambulatory Visit (INDEPENDENT_AMBULATORY_CARE_PROVIDER_SITE_OTHER): Payer: 59 | Admitting: "Endocrinology

## 2021-04-02 ENCOUNTER — Ambulatory Visit: Payer: 59 | Admitting: Adult Health

## 2021-04-02 ENCOUNTER — Encounter (INDEPENDENT_AMBULATORY_CARE_PROVIDER_SITE_OTHER): Payer: Self-pay | Admitting: Family Medicine

## 2021-04-02 ENCOUNTER — Telehealth (INDEPENDENT_AMBULATORY_CARE_PROVIDER_SITE_OTHER): Payer: 59 | Admitting: Family Medicine

## 2021-04-02 ENCOUNTER — Ambulatory Visit (INDEPENDENT_AMBULATORY_CARE_PROVIDER_SITE_OTHER): Payer: 59 | Admitting: Family Medicine

## 2021-04-02 ENCOUNTER — Other Ambulatory Visit (HOSPITAL_COMMUNITY): Payer: Self-pay

## 2021-04-02 DIAGNOSIS — E663 Overweight: Secondary | ICD-10-CM

## 2021-04-02 DIAGNOSIS — F39 Unspecified mood [affective] disorder: Secondary | ICD-10-CM | POA: Diagnosis not present

## 2021-04-02 DIAGNOSIS — Z6829 Body mass index (BMI) 29.0-29.9, adult: Secondary | ICD-10-CM

## 2021-04-02 DIAGNOSIS — E1122 Type 2 diabetes mellitus with diabetic chronic kidney disease: Secondary | ICD-10-CM | POA: Diagnosis not present

## 2021-04-02 DIAGNOSIS — F418 Other specified anxiety disorders: Secondary | ICD-10-CM

## 2021-04-02 DIAGNOSIS — Z6825 Body mass index (BMI) 25.0-25.9, adult: Secondary | ICD-10-CM | POA: Diagnosis not present

## 2021-04-02 DIAGNOSIS — N1831 Chronic kidney disease, stage 3a: Secondary | ICD-10-CM | POA: Diagnosis not present

## 2021-04-02 DIAGNOSIS — E669 Obesity, unspecified: Secondary | ICD-10-CM | POA: Diagnosis not present

## 2021-04-02 MED ORDER — OZEMPIC (1 MG/DOSE) 4 MG/3ML ~~LOC~~ SOPN
1.0000 mg | PEN_INJECTOR | SUBCUTANEOUS | 1 refills | Status: DC
  Filled 2021-04-02: qty 3, 28d supply, fill #0
  Filled 2021-05-21: qty 3, 28d supply, fill #1

## 2021-04-03 ENCOUNTER — Telehealth: Payer: Self-pay | Admitting: Internal Medicine

## 2021-04-03 DIAGNOSIS — U071 COVID-19: Secondary | ICD-10-CM

## 2021-04-03 NOTE — Telephone Encounter (Addendum)
[name]  ?[phone] ? ?Alexa [name] called to say she is still unable to go back to work, still has productive cough, tightness in shest, shortness of breath sometimes, fatigued, and cream color phlegm. She stated that [employer] is not letting her go back to work and she is going to need to be cleared by you and she is also going to need [employer] paperwork filled out. She missed last Friday. She had some old steroids at home she took yesterday. Positive for COVID 03/27/2021 Would you like for me to have her come in this afternoon? ?

## 2021-04-03 NOTE — Telephone Encounter (Signed)
scheduled

## 2021-04-03 NOTE — Telephone Encounter (Signed)
LVM to CB to schedule an appointment 

## 2021-04-04 ENCOUNTER — Other Ambulatory Visit: Payer: Self-pay

## 2021-04-04 ENCOUNTER — Telehealth: Payer: Self-pay | Admitting: Internal Medicine

## 2021-04-04 ENCOUNTER — Encounter: Payer: Self-pay | Admitting: Internal Medicine

## 2021-04-04 ENCOUNTER — Ambulatory Visit (INDEPENDENT_AMBULATORY_CARE_PROVIDER_SITE_OTHER): Payer: 59 | Admitting: Internal Medicine

## 2021-04-04 VITALS — BP 128/82 | HR 110 | Temp 99.0°F

## 2021-04-04 DIAGNOSIS — H6692 Otitis media, unspecified, left ear: Secondary | ICD-10-CM | POA: Diagnosis not present

## 2021-04-04 DIAGNOSIS — E119 Type 2 diabetes mellitus without complications: Secondary | ICD-10-CM

## 2021-04-04 DIAGNOSIS — F32A Depression, unspecified: Secondary | ICD-10-CM | POA: Diagnosis not present

## 2021-04-04 DIAGNOSIS — J22 Unspecified acute lower respiratory infection: Secondary | ICD-10-CM | POA: Diagnosis not present

## 2021-04-04 DIAGNOSIS — F439 Reaction to severe stress, unspecified: Secondary | ICD-10-CM | POA: Diagnosis not present

## 2021-04-04 DIAGNOSIS — Z8709 Personal history of other diseases of the respiratory system: Secondary | ICD-10-CM | POA: Diagnosis not present

## 2021-04-04 DIAGNOSIS — F419 Anxiety disorder, unspecified: Secondary | ICD-10-CM

## 2021-04-04 DIAGNOSIS — U071 COVID-19: Secondary | ICD-10-CM | POA: Diagnosis not present

## 2021-04-04 MED ORDER — CEFTRIAXONE SODIUM 1 G IJ SOLR
1.0000 g | Freq: Once | INTRAMUSCULAR | Status: AC
  Administered 2021-04-04: 1 g via INTRAMUSCULAR

## 2021-04-04 MED ORDER — DOXYCYCLINE HYCLATE 100 MG PO TABS: 100.0000 mg | ORAL_TABLET | Freq: Two times a day (BID) | ORAL | 0 refills | Status: DC

## 2021-04-04 MED ORDER — FLUCONAZOLE 150 MG PO TABS: 150.0000 mg | ORAL_TABLET | Freq: Once | ORAL | 1 refills | Status: AC

## 2021-04-04 MED ORDER — METHYLPREDNISOLONE 4 MG PO TABS: ORAL_TABLET | ORAL | 0 refills | Status: DC

## 2021-04-04 MED ORDER — HYDROCODONE BIT-HOMATROP MBR 5-1.5 MG/5ML PO SOLN: 5.0000 mL | Freq: Three times a day (TID) | ORAL | 0 refills | Status: DC | PRN

## 2021-04-04 NOTE — Progress Notes (Signed)
? ?  Subjective:  ? ? Patient ID: Alexa [name], female    DOB: December 04, 1960, 61 y.o.   MRN: [id number] ? 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  She was diagnosed with COVID-19 virus infection in late February.  She had an e- visit with nurse practitioner February 28 and was treated with Zithromax Z-PAK, Diflucan 150 mg tablet to prevent Candida vaginitis, Tessalon Perles 100 mg 3 times daily.  She also was given a prednisone taper by nurse practitioner and was advised to continue this as follows: prednisone 20 mg twice daily for 5 days. ? ? She has a history of asthma.  She also uses albuterol inhaler as needed for wheezing. ? ?She also has a history of type 2 diabetes mellitus followed by Dr. Tobe Sos.  History of anxiety and depression. ?She has extreme fatigue and cough.  Sputum is cream colored.  No documented fever. ? ?Husband is also ill with COVID-19.  She says that he has more symptoms including diarrhea.  This is all been quite stressful for her. ? ? No fever since last week. Having night sweats which could be from steroids or the illness. Took Zithromax with little improvement. ? ?Review of Systems --took some leftover steroids recently and started with 40 mg initially and is now down to 30 mg- did 40 mg daily for 2 days.  Will be needing further steroid taper.  ? ? I think it is wise to do a 12 day taper at this point. Will use Medrol 4 mg tablets #42 6-6-5-5-4-4-3-3-2-2-1-1 taper. ? ?Rx sent for Hycodan for cough. Sent in Doxycycline 100 mg twice daily for 10 days. ? ?She has FMLA paperwork for [name] to complete today. ? ?   ?Objective:  ? Physical Exam ? ?Vital signs reviewed. BP 128/82 pulse 110 regular pulse ox 99% T 99 degrees tympanic ?She has an acute left otitis media. Pharynx slightly injected without exudate. Chest exam has bronchial breath sounds both lower lobes but no frank rales or wheezing. ? ?   ?Assessment & Plan:  ?Acute COVID-19 virus  infection ? ?Left otitis media ? ?Acute lower respiratory infection ? ?Anxiety and depression ? ?Situational stress-husband is ill also ? ?Type 2 diabetes mellitus without long-term use of insulin ? ?History of asthma ? ?Plan: Patient will take Medrol 4 mg tablets in tapering course as directed starting with 6 x '4mg'$  on day 1 and decreasing by 1 tablet every 2 days I.e. 6-6-5-5-4-4-3-3-2-2-1-1 taper. Take Hycodan sparingly for cough.  Doxycycline 100 mg twice daily for 10 days. Diflucan if needed for Candida vaginitis.  Return in 1 week.  Out of work for 1 week.  Return here in 1 week.  FMLA paperwork completed. ? ?

## 2021-04-04 NOTE — Patient Instructions (Signed)
Take Medrol in tapering course as directed i.e. 6-6-5-5-4-4-3-3-2-2-1-1 taper.  Take Hycodan 1 teaspoon every 6-8 hours as needed for cough.  Diflucan prescribed for Candida vaginitis while on antibiotics.  Doxycycline 100 mg twice daily for 10 days.  Out of work until follow-up in 1 week. ?

## 2021-04-04 NOTE — Telephone Encounter (Signed)
Received [employer] FMLA ppw on 04/03/2021 ?Scheduled OV - 04/04/2021 ?Intake # [id number] ? ?Completed and faxed back on 04/04/2021 ?[employer] [phone] ? ?Confirmation ? ?This message was sent via [place], a product from [employer] [email or web address] ? ?                  -------Fax Transmission Report------- ? ?To:               Recipient at [phone] ?Subject:          FW: Hp Scans ?Result:           The transmission was successful. ?Explanation:      All Pages Ok ?Pages Sent:       6 ?Connect Time:     3 minutes, 12 seconds ?Transmit Time:    04/04/2021 14:03 ?Transfer Rate:    14400 ?Status Code:      0000 ?Retry Count:      0 ?Job Id:           [id number] ?Unique Id:        [id number] ?Fax Line:         10 ?Fax Server:       [id number] ? ? ? ?

## 2021-04-04 NOTE — Addendum Note (Signed)
Addended by: Angus Seller on: 04/04/2021 01:38 PM ? ? Modules accepted: Orders ? ?

## 2021-04-07 NOTE — Telephone Encounter (Signed)
Re-faxed- received another fax like they had not received the fax that was faxed on Friday ? ?This message was sent via [facility], a  ?product from [employer] http://[email or web address]/ ? ?                  -------Fax Transmission Report------- ? ?To:               Recipient at [phone] ?Subject:          FW: Hp Scans ?Result:           The transmission was successful. ?Explanation:      All Pages Ok ?Pages Sent:       10 ?Connect Time:     5 minutes, 39 seconds ?Transmit Time:    04/07/2021 09:58 ?Transfer Rate:    14400 ?Status Code:      0000 ?Retry Count:      0 ?Job Id:           [id number] ?Unique Id:        [id number] ?Fax Line:         43 ?Fax Server:       [id number] ? ? ?

## 2021-04-11 ENCOUNTER — Encounter: Payer: Self-pay | Admitting: Internal Medicine

## 2021-04-11 ENCOUNTER — Telehealth: Payer: Self-pay | Admitting: Internal Medicine

## 2021-04-11 ENCOUNTER — Other Ambulatory Visit (HOSPITAL_COMMUNITY): Payer: Self-pay

## 2021-04-11 ENCOUNTER — Other Ambulatory Visit: Payer: Self-pay

## 2021-04-11 ENCOUNTER — Ambulatory Visit (INDEPENDENT_AMBULATORY_CARE_PROVIDER_SITE_OTHER): Payer: 59 | Admitting: Internal Medicine

## 2021-04-11 VITALS — BP 118/70 | HR 97 | Temp 98.0°F

## 2021-04-11 DIAGNOSIS — Z8709 Personal history of other diseases of the respiratory system: Secondary | ICD-10-CM | POA: Diagnosis not present

## 2021-04-11 DIAGNOSIS — E1122 Type 2 diabetes mellitus with diabetic chronic kidney disease: Secondary | ICD-10-CM

## 2021-04-11 DIAGNOSIS — U071 COVID-19: Secondary | ICD-10-CM | POA: Diagnosis not present

## 2021-04-11 DIAGNOSIS — Z0289 Encounter for other administrative examinations: Secondary | ICD-10-CM

## 2021-04-11 DIAGNOSIS — J22 Unspecified acute lower respiratory infection: Secondary | ICD-10-CM | POA: Diagnosis not present

## 2021-04-11 DIAGNOSIS — N1831 Chronic kidney disease, stage 3a: Secondary | ICD-10-CM | POA: Diagnosis not present

## 2021-04-11 MED ORDER — METHYLPREDNISOLONE 4 MG PO TABS
ORAL_TABLET | ORAL | 0 refills | Status: AC
  Filled 2021-04-11: qty 42, 12d supply, fill #0

## 2021-04-11 NOTE — Patient Instructions (Addendum)
Note given to return to work on Monday, March 20.  Additional  paperwork completed today as requested for employment.  Please make health maintenance exam appointment for the near future.  I have given her additional Medrol prescription 4 mg tablets 12-day taper to have on hand if needed ? ? ?

## 2021-04-11 NOTE — Telephone Encounter (Signed)
Faxed [employer] paperwork to  ?Sandra Cockayne ?[phone] ? ?Accommodation # [phone] ? ?Form completed ? ?

## 2021-04-11 NOTE — Telephone Encounter (Signed)
This message was sent via [place], a product from [employer] http://www.[email or web address]/ ? ?                  -------Fax Transmission Report------- ? ?To:               Recipient at [phone] ?Subject:          FW: Hp Scans ?Result:           The transmission was successful. ?Explanation:      All Pages Ok ?Pages Sent:       6 ?Connect Time:     3 minutes, 12 seconds ?Transmit Time:    04/11/2021 12:45 ?Transfer Rate:    14400 ?Status Code:      0000 ?Retry Count:      0 ?Job Id:           [id number] ?Unique Id:        [id number] ?Fax Line:         1 ?Fax Server:       [id number] ? ? ?

## 2021-04-11 NOTE — Progress Notes (Signed)
? ?  Subjective:  ? ? Patient ID: Alexa [name], female    DOB: 09/07/1960, 61 y.o.   MRN: [id number] ? ?HPI 61 year old Female with history of mild intermittent asthma and type 2 diabetes mellitus developed acute COVID-19 virus infection on February 28.  Was given oral steroids for 5 days through the E-visit with nurse practitioner.  She was seen here by virtual visit on March 2 still not well.  Still had cough, congestion, chills, low-grade fever ,myalgias, and tightness in her chest.  Was treated with Zithromax Z-PAK 2 tabs day 1 followed by 1 tab days 2 through 5.  We talked about pros and cons of Paxlovid and we decided to go with antibiotic treatment instead.  Was given Ladona Ridgel and was advised to continue using Ventolin inhaler.  Was advised to continue with prednisone taper per nurse practitioner 20 mg twice daily for 5 days. ? ?She has a history of type 2 diabetes mellitus seen by Dr. Tobe Sos.  History of anxiety and depression.  Was seen in office on March 10 saying that she took Zithromax with little improvement.  She had taken some leftover prednisone 40 mg and had worked her way down to 30 mg.  She was placed on a Medrol 4 mg taper for 12 days.  She was given Hycodan for cough and placed on doxycycline 100 mg twice daily for 10 days.  She was thought to have an acute lower respiratory infection due to COVID-19 virus infection.  Was taken out of work for 1 week and is now here for follow-up. ? ? ? ?Review of Systems feels much better.  Has more strength and less congestion. ? ?   ?Objective:  ? Physical Exam ? ?Blood pressure 118/70 pulse 97 temperature 98 degrees orally pulse oximetry 98% ? ?Skin warm and dry.  No cervical adenopathy.  Chest is clear to auscultation.  TMs are clear. ? ? ?   ?Assessment & Plan:  ?Appears to have recovered from acute COVID-19 virus infection. ? ?I have given her an additional Medrol 4 mg 12 day taper to take if needed for recurrent respiratory congestion. ? ?Type 2  diabetes mellitus-stable with current regimen ? ?Plan: Note given to return to work on Monday, March 20.  Additional FMLA paperwork will be completed by staff today. ? ?Last health maintenance exam was May 24, 2020.  Will need appointment for 2023 in the near future. ? ?

## 2021-04-14 ENCOUNTER — Other Ambulatory Visit (HOSPITAL_COMMUNITY): Payer: Self-pay

## 2021-04-14 NOTE — Telephone Encounter (Signed)
This message was sent via [place], a product from [employer] http://www.[email or web address]/ ? ?                  -------Fax Transmission Report------- ? ?To:               Recipient at [phone] ?Subject:          FW: Hp Scans ?Result:           The transmission was successful. ?Explanation:      All Pages Ok ?Pages Sent:       6 ?Connect Time:     3 minutes, 13 seconds ?Transmit Time:    04/14/2021 10:48 ?Transfer Rate:    14400 ?Status Code:      0000 ?Retry Count:      1 ?Job Id:           [id number] ?Unique Id:        [id number] ?Fax Line:         5 ?Fax Server:       [id number] ? ? ?

## 2021-04-14 NOTE — Telephone Encounter (Signed)
This message was sent via [place], a product from [employer] http://www.[email or web address]/ ? ?                  -------Fax Transmission Report------- ? ?To:               Recipient at [phone] ?Subject:          FW: Hp Scans ?Result:           The transmission was successful. ?Explanation:      All Pages Ok ?Pages Sent:       5 ?Connect Time:     3 minutes, 8 seconds ?Transmit Time:    04/14/2021 10:44 ?Transfer Rate:    14400 ?Status Code:      0000 ?Retry Count:      0 ?Job Id:           [id number] ?Unique Id:        [id number] ?Fax Line:         4 ?Fax Server:       [id number] ? ? ?

## 2021-05-07 ENCOUNTER — Other Ambulatory Visit: Payer: Self-pay | Admitting: Internal Medicine

## 2021-05-07 DIAGNOSIS — Z1231 Encounter for screening mammogram for malignant neoplasm of breast: Secondary | ICD-10-CM

## 2021-05-21 ENCOUNTER — Other Ambulatory Visit (HOSPITAL_COMMUNITY): Payer: Self-pay

## 2021-05-28 ENCOUNTER — Ambulatory Visit
Admission: RE | Admit: 2021-05-28 | Discharge: 2021-05-28 | Disposition: A | Discharge status: Home / Self Care | Payer: 59 | Source: Ambulatory Visit | Attending: Internal Medicine | Admitting: Internal Medicine

## 2021-05-28 ENCOUNTER — Other Ambulatory Visit (HOSPITAL_COMMUNITY): Payer: Self-pay

## 2021-05-28 ENCOUNTER — Encounter: Payer: Self-pay | Admitting: Adult Health

## 2021-05-28 ENCOUNTER — Ambulatory Visit (INDEPENDENT_AMBULATORY_CARE_PROVIDER_SITE_OTHER): Payer: 59 | Admitting: Adult Health

## 2021-05-28 DIAGNOSIS — Z1231 Encounter for screening mammogram for malignant neoplasm of breast: Secondary | ICD-10-CM | POA: Diagnosis not present

## 2021-05-28 DIAGNOSIS — F4310 Post-traumatic stress disorder, unspecified: Secondary | ICD-10-CM | POA: Diagnosis not present

## 2021-05-28 DIAGNOSIS — F39 Unspecified mood [affective] disorder: Secondary | ICD-10-CM | POA: Diagnosis not present

## 2021-05-28 DIAGNOSIS — F331 Major depressive disorder, recurrent, moderate: Secondary | ICD-10-CM | POA: Diagnosis not present

## 2021-05-28 DIAGNOSIS — G4700 Insomnia, unspecified: Secondary | ICD-10-CM

## 2021-05-28 DIAGNOSIS — F411 Generalized anxiety disorder: Secondary | ICD-10-CM

## 2021-05-28 MED ORDER — TRAZODONE HCL 50 MG PO TABS
50.0000 mg | ORAL_TABLET | Freq: Every evening | ORAL | 3 refills | Status: DC | PRN
  Filled 2021-05-28 – 2021-06-16 (×2): qty 180, 90d supply, fill #0

## 2021-05-28 NOTE — Progress Notes (Signed)
Alexa [name] ?[id number] ?[birth year] ?61 y.o. ? ?Subjective:  ? ?Patient ID:  Alexa [name] is a 61 y.o. (DOB Jan 15, 1961) female. ? ?Chief Complaint: No chief complaint on file. ? ? ?HPI ?KERRY ODONOHUE presents to the office today for follow-up of MDD, GAD, insomnia and PTSD.  ? ?Referred by therapist - [facility] [employer] ? ?Describes mood today as "ok". Pleasant. Denies tearfulness. Mood symptoms - reports decreased depression, anxiety, and irritability. Stating "I feel pretty good". Feels like the Cymbalta works well for her. Seeing therapist [facility] [employer]. Stable interest and motivation. Taking medications as prescribed.  ?Energy levels stable. Active, does not have a regular exercise routine.   ?Enjoys some usual interests and activities. Married x 21 years. Lives with husband and 3 cats. Spending time with family and friends ?Appetite adequate. Weight stable - 138 pounds. ?Sleeping difficulties. Averages 6 to 7 hours. ?Focus and concentration stable. Completing tasks. Managing aspects of household. Has worked as a Marine scientist for 36 years. ?Denies SI or HI.  ?Denies AH or VH. ?Multiple health issues. ? ?Previous medication trials:  Lexapro, Ambien,  ? ? ?PHQ2-9   ? ?[facility] from 11/28/2020 in [facility] Office Visit from 05/16/2020 in [facility] Office Visit from 03/31/2019 in [facility] Office Visit from 12/13/2017 in [facility] Office Visit from 11/16/2017 in [facility]  ?PHQ-2 Total Score 1 4 0 2 5  ?PHQ-9 Total Score 7 5 -- 6 17  ? ?  ?  ? ?Review of Systems:  ?Review of Systems  ?Musculoskeletal:  Negative for gait problem.  ?Neurological:  Negative for tremors.  ?Psychiatric/Behavioral:    ?     Please refer to HPI  ? ?Medications: I have reviewed the patient's current medications. ? ?Current Outpatient Medications  ?Medication Sig Dispense Refill  ? albuterol (PROVENTIL) (2.5 MG/3ML) 0.083% nebulizer solution Take 3 mLs (2.5 mg total) by  nebulization every 6 (six) hours as needed for wheezing or shortness of breath. 150 mL 1  ? albuterol (VENTOLIN HFA) 108 (90 Base) MCG/ACT inhaler Inhale 2 puffs into the lungs every 6 (six) hours as needed for wheezing or shortness of breath. 6.7 g 11  ? albuterol (VENTOLIN HFA) 108 (90 Base) MCG/ACT inhaler Inhale 2 puffs into the lungs every 6 (six) hours as needed for wheezing or shortness of breath. 8 g 0  ? benzonatate (TESSALON) 100 MG capsule Take 1 capsule (100 mg total) by mouth 3 (three) times daily as needed for cough. 30 capsule 0  ? betamethasone valerate ointment (VALISONE) 0.1 % Apply 1 application topically 2 (two) times daily. 30 g 1  ? Blood Glucose Monitoring Suppl (FREESTYLE LITE) DEVI Use to check glucose 3x daily 2 each 5  ? calcium-vitamin D (OSCAL WITH D) 500-200 MG-UNIT TABS tablet Take by mouth.    ? carvedilol (COREG) 3.125 MG tablet Take 1 tablet (3.125 mg total) by mouth 2 (two) times daily with a meal. 120 tablet 10  ? Cholecalciferol 50 MCG (2000 UT) CAPS Take 1 capsule by mouth daily.    ? conjugated estrogens (PREMARIN) vaginal cream Place 1 applicatorful vaginally at bedtime for 21 days then off for 7 days. 30 g 11  ? Cyanocobalamin (VITAMIN B 12) 100 MCG LOZG Take by mouth.    ? cyclobenzaprine (FLEXERIL) 10 MG tablet Take 1 tablet (10 mg total) by mouth 3 (three) times daily as needed for muscle spasms. 90 tablet  0  ? doxycycline (VIBRA-TABS) 100 MG tablet Take 1 tablet (100 mg total) by mouth 2 (two) times daily. 20 tablet 0  ? DULoxetine (CYMBALTA) 60 MG capsule Take 1 capsule (60 mg total) by mouth daily. 90 capsule 3  ? Efinaconazole 10 % SOLN Apply 1 drop topically daily. 4 mL 11  ? Ferrous Sulfate (SLOW FE PO) Take 1 tablet by mouth daily.     ? glucose blood (FREESTYLE LITE) test strip Use 1 to check blood glucose 3 times daily. 300 each 1  ? HYDROcodone bit-homatropine (HYCODAN) 5-1.5 MG/5ML syrup Take 5 mLs by mouth every 8 (eight) hours as needed for cough. 120 mL 0  ?  ibuprofen (ADVIL,MOTRIN) 200 MG tablet Take 600 mg by mouth daily as needed for headache or moderate pain.    ? levothyroxine (SYNTHROID) 25 MCG tablet TAKE 1 TABLET BY MOUTH ONCE DAILY 90 tablet 1  ? metFORMIN (GLUCOPHAGE) 1000 MG tablet TAKE 1 TABLET BY MOUTH 2 TIMES DAILY WITH A MEAL. 180 tablet 0  ? Multiple Vitamin (MULTIVITAMIN) tablet Take 1 tablet by mouth daily. Reported on 02/26/2015 30 tablet 0  ? ondansetron (ZOFRAN) 4 MG tablet Take 1 tablet (4 mg total) by mouth every 8 (eight) hours as needed for nausea or vomiting. 20 tablet 0  ? pantoprazole (PROTONIX) 40 MG tablet TAKE 1 TABLET (40 MG TOTAL) BY MOUTH DAILY. 90 tablet 4  ? Semaglutide, 1 MG/DOSE, (OZEMPIC, 1 MG/DOSE,) 4 MG/3ML SOPN Inject 1 mg into the skin once a week. 3 mL 1  ? telmisartan (MICARDIS) 40 MG tablet TAKE 1/2 TABLET BY MOUTH TWO TIMES DAILY 90 tablet 3  ? traZODone (DESYREL) 50 MG tablet Take 1-2 tablets (50-100 mg total) by mouth at bedtime as needed for sleep 180 tablet 3  ? Vitamin D, Ergocalciferol, (DRISDOL) 50000 units CAPS capsule Take 50,000 Units by mouth every Sunday.    ? ?No current facility-administered medications for this visit.  ? ? ?Medication Side Effects: None ? ?Allergies:  ?Allergies  ?Allergen Reactions  ? Theophyllines Palpitations  ? ? ?Past Medical History:  ?Diagnosis Date  ? Allergy   ? Anemia, iron deficiency   ? Anhedonia   ? Anxiety   ? Asthma   ? Asthma, chronic   ? Back pain   ? CAD (coronary artery disease)   ? Cervical spondylosis 06/13/2020  ? Chewing difficulty   ? Chronic insomnia   ? Chronic kidney disease   ? CKD stage 3 A   ? Combined hyperlipidemia   ? Constipation   ? Depression   ? Diabetes mellitus   ? Diabetes mellitus type II   ? Diabetic autonomic neuropathy ([place])   ? Difficulty swallowing pills   ? DM type 2 with diabetic peripheral neuropathy ([place])   ? Dry mouth   ? Dyspepsia   ? Dyspnea   ? Elevated homocysteine   ? Essential tremor   ? Fatigue   ? Fatty liver   ? GERD (gastroesophageal  reflux disease)   ? GERD (gastroesophageal reflux disease)   ? Glossitis   ? Goiter   ? Hemorrhoids   ? HLD (hyperlipidemia)   ? Hyperparathyroidism , secondary, non-renal ([place])   ? Hypertension   ? Hypocalcemia   ? Insomnia   ? Leg edema   ? Leg pain   ? Macular degeneration   ? Nonproliferative diabetic retinopathy associated with type 2 diabetes mellitus ([place])   ? RESOLVED  ? Obesity, Class III, BMI 40-49.9 (  morbid obesity) ([place])   ? Osteopenia   ? Osteoporosis   ? Pallor   ? PONV (postoperative nausea and vomiting)   ? Status post gastric surgery   ? Tachycardia   ? Thyroiditis, autoimmune   ? Vaginal dryness, menopausal   ? Vitamin D deficiency   ? ? ?Past Medical History, Surgical history, Social history, and Family history were reviewed and updated as appropriate.  ? ?Please see review of systems for further details on the patient's review from today.  ? ?Objective:  ? ?Physical Exam:  ?LMP 05/11/2010  ? ?Physical Exam ?Constitutional:   ?   General: She is not in acute distress. ?Musculoskeletal:     ?   General: No deformity.  ?Neurological:  ?   Mental Status: She is alert and oriented to person, place, and time.  ?   Coordination: Coordination normal.  ?Psychiatric:     ?   Attention and Perception: Attention and perception normal. She does not perceive auditory or visual hallucinations.     ?   Mood and Affect: Mood normal. Mood is not anxious or depressed. Affect is not labile, blunt, angry or inappropriate.     ?   Speech: Speech normal.     ?   Behavior: Behavior normal.     ?   Thought Content: Thought content normal. Thought content is not paranoid or delusional. Thought content does not include homicidal or suicidal ideation. Thought content does not include homicidal or suicidal plan.     ?   Cognition and Memory: Cognition and memory normal.     ?   Judgment: Judgment normal.  ?   Comments: Insight intact  ? ? ?Lab Review:  ?   ?Component Value Date/Time  ? NA 137 02/05/2021 1118  ? K 4.5  02/05/2021 1118  ? CL 104 02/05/2021 1118  ? CO2 23 02/05/2021 1118  ? GLUCOSE 130 02/05/2021 1118  ? BUN 19 02/05/2021 1118  ? CREATININE 1.04 (H) 02/05/2021 1118  ? CALCIUM 9.5 02/05/2021 1118  ? CALCIUM 9

## 2021-05-30 ENCOUNTER — Encounter: Payer: 59 | Admitting: Internal Medicine

## 2021-06-04 ENCOUNTER — Encounter (INDEPENDENT_AMBULATORY_CARE_PROVIDER_SITE_OTHER): Payer: Self-pay | Admitting: Nurse Practitioner

## 2021-06-04 ENCOUNTER — Ambulatory Visit (INDEPENDENT_AMBULATORY_CARE_PROVIDER_SITE_OTHER): Payer: 59 | Admitting: Nurse Practitioner

## 2021-06-04 ENCOUNTER — Other Ambulatory Visit (HOSPITAL_COMMUNITY): Payer: Self-pay

## 2021-06-04 VITALS — BP 123/83 | HR 78 | Temp 98.0°F | Ht 63.0 in | Wt 137.0 lb

## 2021-06-04 DIAGNOSIS — E1122 Type 2 diabetes mellitus with diabetic chronic kidney disease: Secondary | ICD-10-CM | POA: Diagnosis not present

## 2021-06-04 DIAGNOSIS — Z9189 Other specified personal risk factors, not elsewhere classified: Secondary | ICD-10-CM

## 2021-06-04 DIAGNOSIS — Z6824 Body mass index (BMI) 24.0-24.9, adult: Secondary | ICD-10-CM

## 2021-06-04 DIAGNOSIS — N1831 Chronic kidney disease, stage 3a: Secondary | ICD-10-CM

## 2021-06-04 DIAGNOSIS — M810 Age-related osteoporosis without current pathological fracture: Secondary | ICD-10-CM | POA: Diagnosis not present

## 2021-06-04 DIAGNOSIS — E669 Obesity, unspecified: Secondary | ICD-10-CM | POA: Diagnosis not present

## 2021-06-04 DIAGNOSIS — Z7985 Long-term (current) use of injectable non-insulin antidiabetic drugs: Secondary | ICD-10-CM

## 2021-06-04 DIAGNOSIS — Z6829 Body mass index (BMI) 29.0-29.9, adult: Secondary | ICD-10-CM

## 2021-06-04 MED ORDER — OZEMPIC (1 MG/DOSE) 4 MG/3ML ~~LOC~~ SOPN
1.0000 mg | PEN_INJECTOR | SUBCUTANEOUS | 0 refills | Status: DC
  Filled 2021-06-04 – 2021-06-16 (×2): qty 3, 28d supply, fill #0

## 2021-06-06 ENCOUNTER — Other Ambulatory Visit (HOSPITAL_COMMUNITY): Payer: Self-pay

## 2021-06-09 DIAGNOSIS — H0015 Chalazion left lower eyelid: Secondary | ICD-10-CM | POA: Diagnosis not present

## 2021-06-09 NOTE — Progress Notes (Signed)
? ? ? ?Chief Complaint:  ? ?OBESITY ?Alexa [name] is here to discuss her progress with her obesity treatment plan along with follow-up of her obesity related diagnoses. Alexa [name] is on practicing portion control and making smarter food choices, such as increasing vegetables and decreasing simple carbohydrates and states she is following her eating plan approximately 75% of the time. Alexa [name] states she is walking for 30 minutes 2 times per week. ? ?Today's visit was #: 31 ?Starting weight: 169 lbs ?Starting date: 11/16/2017 ?Today's weight: 137 lbs ?Today's date: 06/04/2021 ?Total lbs lost to date: 32 lbs ?Total lbs lost since last in-office visit: 1 lb ? ?Interim History: Alexa [name] is status post Roux-en-Y gastric bypass in 2008 with Dr. Lucia Gaskins. Her highest weight prior to surgery was 290 lbs and her nadir weight is her current weight of 139 lbs. She is eating 4-5 small meals per day. She is taking a multivitamin, Vitamin D + calcium and Vitamin B 12. She is not substituting a protein shake for a meal replacement and doesn't eat any protein bars. She is unsure of protein intake and the plan. ? ?Subjective:  ? ?1. Type 2 diabetes mellitus with stage 3a chronic kidney disease, without long-term current use of insulin ([place]) ?Alexa [name] is seeing endoscopy on a regular basis. She has a follow up appointment scheduled. She is taking Ozempic 1 mg. She denies side effects.  ? ?2. Osteoporosis, unspecified osteoporosis type, unspecified pathological fracture presence ?Alexa [name]'s last Dexa 09/08/2019. We recommend to repeat in 2 years.  Next DEXA due August 2023. She plans to discuss with Dr. Tobe Sos. She is taking calcium with Vitamin D. She has of history of Roux-en-Y. Her last Vitamin D was 45.  ? ?3. At risk for malnutrition ?Alexa [name] is at risk for malnutrition due to bariatric surgery.   ? ?Assessment/Plan:  ? ?1. Type 2 diabetes mellitus with stage 3a chronic kidney disease, without long-term current use of insulin ([name]) ?We will refill  Ozempic for 1 month with no refills. We discussed side effects today. [name] will keep appointment scheduled with endocrinologist for follow up and labs. Good blood sugar control is important to decrease the likelihood of diabetic complications such as nephropathy, neuropathy, limb loss, blindness, coronary artery disease, and death. Intensive lifestyle modification including diet, exercise and weight loss are the first line of treatment for diabetes.  ? ?- Semaglutide, 1 MG/DOSE, (OZEMPIC, 1 MG/DOSE,) 4 MG/3ML SOPN; Inject 1 mg into the skin once a week.  Dispense: 3 mL; Refill: 0 ? ?2. Osteoporosis, unspecified osteoporosis type, unspecified pathological fracture presence ?We will continue to monitor. Orders and follow up as documented in patient record. [name] will keep appointment scheduled with endocrinologist. We discussed resistance training with her today.  ? ?Counseling ?Osteoporosis happens when your bones get thin and weak. This can cause your bones to break (fracture) more easily.  ?Exercise is very important to keep bones strong. Focus on strength training (lifting weights) and exercises that make your muscles work to hold your body weight up (weight-bearing exercises). These include tai chi, yoga, and walking.  ?Limit alcohol intake to no more than 1 drink a day for nonpregnant women and 2 drinks a day for men. One drink equals 12 oz of beer, 5 oz of wine, or 1? oz of hard liquor. ?Do not use any products that have nicotine or tobacco in them.  ?Preventing falls ?Use tools to help you move around (mobility aids) as needed. These include canes, walkers, scooters, and crutches. ?Keep rooms  well-lit and free of clutter. ?Wear shoes that fit you well and support your feet. ?Eat plenty of calcium and Vitamin D as these nutrients are good for your bones.  ?  ? ?3. At risk for malnutrition ?Nil was given approximately 15 minutes of counseling today regarding prevention of malnutrition and ways to meet  macronutrient goals..   ? ?4. Obesity: Current BMI 24.4 ?Alexa [name] is currently in the action stage of change. As such, her goal is to continue with weight loss efforts. She has agreed to practicing portion control and making smarter food choices, such as increasing vegetables and decreasing simple carbohydrates.  ? ?Exercise goals:  As is.  ? ?Behavioral modification strategies: increasing lean protein intake, increasing water intake, and keeping a strict food journal. ? ?Alexa [name] has agreed to follow-up with our clinic in 2 months. She will call to schedule appointment. She was informed of the importance of frequent follow-up visits to maximize her success with intensive lifestyle modifications for her multiple health conditions.  ? ?Objective:  ? ?Blood pressure 123/83, pulse 78, temperature 98 ?F (36.7 ?C), height _0  (1.6 m), weight 137 lb (62.1 kg), last menstrual period 05/11/2010, SpO2 99 %. ?Body mass index is 24.27 kg/m?. ? ?General: Cooperative, alert, well developed, in no acute distress. ?HEENT: Conjunctivae and lids unremarkable. ?Cardiovascular: Regular rhythm.  ?Lungs: Normal work of breathing. ?Neurologic: No focal deficits.  ? ?Lab Results  ?Component Value Date  ? CREATININE 1.04 (H) 02/05/2021  ? BUN 19 02/05/2021  ? NA 137 02/05/2021  ? K 4.5 02/05/2021  ? CL 104 02/05/2021  ? CO2 23 02/05/2021  ? ?Lab Results  ?Component Value Date  ? ALT 29 02/05/2021  ? AST 25 02/05/2021  ? ALKPHOS 85 05/27/2016  ? BILITOT 0.5 02/05/2021  ? ?Lab Results  ?Component Value Date  ? HGBA1C 7.0 (A) 02/05/2021  ? HGBA1C 7.6 (A) 10/01/2020  ? HGBA1C 7.5 (H) 05/16/2020  ? HGBA1C 8.5 (A) 12/11/2019  ? HGBA1C 7.2 (A) 09/08/2019  ? ?Lab Results  ?Component Value Date  ? INSULIN 8.1 11/16/2017  ? ?Lab Results  ?Component Value Date  ? TSH 2.29 02/05/2021  ? ?Lab Results  ?Component Value Date  ? CHOL 210 (H) 05/16/2020  ? HDL 65 05/16/2020  ? LDLCALC 127 (H) 05/16/2020  ? TRIG 84 05/16/2020  ? CHOLHDL 3.2 05/16/2020  ? ?Lab  Results  ?Component Value Date  ? VD25OH 45 02/05/2021  ? VD25OH 71 03/27/2019  ? VD25OH 64 11/01/2017  ? ?Lab Results  ?Component Value Date  ? WBC 5.8 02/05/2021  ? HGB 12.9 02/05/2021  ? HCT 38.9 02/05/2021  ? MCV 84.0 02/05/2021  ? PLT 361 02/05/2021  ? ?Lab Results  ?Component Value Date  ? IRON 73 02/05/2021  ? TIBC 357 04/14/2012  ? ? ?Attestation Statements:  ? ?Reviewed by clinician on day of visit: allergies, medications, problem list, medical history, surgical history, family history, social history, and previous encounter notes. ? ?I, Lizbeth Bark, RMA, am acting as Location manager for Everardo Pacific, FNP. ? ?I have reviewed the above documentation for accuracy and completeness, and I agree with the above. Everardo Pacific, FNP  ?

## 2021-06-10 ENCOUNTER — Encounter: Payer: Self-pay | Admitting: Gastroenterology

## 2021-06-10 ENCOUNTER — Ambulatory Visit (AMBULATORY_SURGERY_CENTER): Payer: 59 | Admitting: Gastroenterology

## 2021-06-10 VITALS — BP 146/80 | HR 89 | Temp 98.6°F | Resp 10 | Ht 63.0 in | Wt 144.0 lb

## 2021-06-10 DIAGNOSIS — I2510 Atherosclerotic heart disease of native coronary artery without angina pectoris: Secondary | ICD-10-CM | POA: Diagnosis not present

## 2021-06-10 DIAGNOSIS — Z86010 Personal history of colonic polyps: Secondary | ICD-10-CM

## 2021-06-10 DIAGNOSIS — D125 Benign neoplasm of sigmoid colon: Secondary | ICD-10-CM | POA: Diagnosis not present

## 2021-06-10 DIAGNOSIS — N1831 Chronic kidney disease, stage 3a: Secondary | ICD-10-CM | POA: Diagnosis not present

## 2021-06-10 DIAGNOSIS — D123 Benign neoplasm of transverse colon: Secondary | ICD-10-CM

## 2021-06-10 DIAGNOSIS — Z1211 Encounter for screening for malignant neoplasm of colon: Secondary | ICD-10-CM | POA: Diagnosis not present

## 2021-06-10 DIAGNOSIS — F419 Anxiety disorder, unspecified: Secondary | ICD-10-CM | POA: Diagnosis not present

## 2021-06-10 MED ORDER — SODIUM CHLORIDE 0.9 % IV SOLN: 500.0000 mL | Freq: Once | INTRAVENOUS | Status: DC

## 2021-06-10 NOTE — Patient Instructions (Signed)
2 polyps removed- await pathology ?Please read over handout about polyps ?Next colonoscopy- 3 years ? ?Continue your normal medications ? ? ?YOU HAD AN ENDOSCOPIC PROCEDURE TODAY AT [facility]:   Refer to the procedure report that was given to you for any specific questions about what was found during the examination.  If the procedure report does not answer your questions, please call your gastroenterologist to clarify.  If you requested that your care partner not be given the details of your procedure findings, then the procedure report has been included in a sealed envelope for you to review at your convenience later. ? ?YOU SHOULD EXPECT: Some feelings of bloating in the abdomen. Passage of more gas than usual.  Walking can help get rid of the air that was put into your GI tract during the procedure and reduce the bloating. If you had a lower endoscopy (such as a colonoscopy or flexible sigmoidoscopy) you may notice spotting of blood in your stool or on the toilet paper. If you underwent a bowel prep for your procedure, you may not have a normal bowel movement for a few days. ? ?Please Note:  You might notice some irritation and congestion in your nose or some drainage.  This is from the oxygen used during your procedure.  There is no need for concern and it should clear up in a day or so. ? ?SYMPTOMS TO REPORT IMMEDIATELY: ? ?Following lower endoscopy (colonoscopy or flexible sigmoidoscopy): ? Excessive amounts of blood in the stool ? Significant tenderness or worsening of abdominal pains ? Swelling of the abdomen that is new, acute ? Fever of 100?F or higher ?For urgent or emergent issues, a gastroenterologist can be reached at any hour by calling [phone]. ?Do not use MyChart messaging for urgent concerns.  ? ? ?DIET:  We do recommend a small meal at first, but then you may proceed to your regular diet.  Drink plenty of fluids but you should avoid alcoholic beverages for 24  hours. ? ?ACTIVITY:  You should plan to take it easy for the rest of today and you should NOT DRIVE or use heavy machinery until tomorrow (because of the sedation medicines used during the test).   ? ?FOLLOW UP: ?Our staff will call the number listed on your records 48-72 hours following your procedure to check on you and address any questions or concerns that you may have regarding the information given to you following your procedure. If we do not reach you, we will leave a message.  We will attempt to reach you two times.  During this call, we will ask if you have developed any symptoms of COVID 19. If you develop any symptoms (ie: fever, flu-like symptoms, shortness of breath, cough etc.) before then, please call [phone].  If you test positive for Covid 19 in the 2 weeks post procedure, please call and report this information to [name].   ? ?If any biopsies were taken you will be contacted by phone or by letter within the next 1-3 weeks.  Please call us at [phone] if you have not heard about the biopsies in 3 weeks.  ? ? ?SIGNATURES/CONFIDENTIALITY: ?You and/or your care partner have signed paperwork which will be entered into your electronic medical record.  These signatures attest to the fact that that the information above on your After Visit Summary has been reviewed and is understood.  Full responsibility of the confidentiality of this discharge information lies with you and/or your  care-partner. ? ?

## 2021-06-10 NOTE — Op Note (Signed)
[facility] ?Patient Name: Alexa [name] ?Procedure Date: 06/10/2021 3:50 PM ?MRN: [id number] ?Endoscopist: Estill Cotta. Loletha Carrow , MD ?Age: 61 ?Referring MD:  ?Date of Birth: 15-Nov-1960 ?Gender: Female ?Account #: [id number] ?Procedure:                Colonoscopy ?Indications:              Surveillance: History of adenomatous polyps,  ?                          inadequate prep on last exam (<48yr- Sept 2021) ?                          Lifelong constipation for which she prefers not to  ?                          take prescription medicine ?Medicines:                Monitored Anesthesia Care ?Procedure:                Pre-Anesthesia Assessment: ?                          - Prior to the procedure, a History and Physical  ?                          was performed, and patient medications and  ?                          allergies were reviewed. The patient's tolerance of  ?                          previous anesthesia was also reviewed. The risks  ?                          and benefits of the procedure and the sedation  ?                          options and risks were discussed with the patient.  ?                          All questions were answered, and informed consent  ?                          was obtained. Prior Anticoagulants: The patient has  ?                          taken no previous anticoagulant or antiplatelet  ?                          agents. ASA Grade Assessment: III - A patient with  ?                          severe systemic disease. After reviewing the risks  ?  and benefits, the patient was deemed in  ?                          satisfactory condition to undergo the procedure. ?                          After obtaining informed consent, the colonoscope  ?                          was passed under direct vision. Throughout the  ?                          procedure, the patient's blood pressure, pulse, and  ?                          oxygen saturations were monitored  continuously. The  ?                          CF [id number] #[id number] was introduced through the anus  ?                          and advanced to the the cecum, identified by  ?                          appendiceal orifice and ileocecal valve. The  ?                          colonoscopy was somewhat difficult due to a  ?                          redundant colon and significant looping. Successful  ?                          completion of the procedure was aided by using  ?                          manual pressure and straightening and shortening  ?                          the scope to obtain bowel loop reduction. The  ?                          patient tolerated the procedure well. The quality  ?                          of the bowel preparation was good in most areas  ?                          with lavage, fair in others (retained opaque liquid  ?                          and fibrous debris that could not be completely  ?  cleared).Overall, the prep quality was improved  ?                          from the last exam. The ileocecal valve,  ?                          appendiceal orifice, and rectum were photographed.  ?                          The bowel preparation used was GoLYTELY (with 2  ?                          days of miralax before prep day). ?Scope In: 4:12:40 PM ?Scope Out: 4:33:11 PM ?Scope Withdrawal Time: 0 hours 15 minutes 0 seconds  ?Total Procedure Duration: 0 hours 20 minutes 31 seconds  ?Findings:                 The perianal and digital rectal examinations were  ?                          normal. ?                          A 5 mm polyp was found in the distal transverse  ?                          colon. The polyp was sessile. The polyp was removed  ?                          with a cold snare. Resection and retrieval were  ?                          complete. ?                          A 5 mm polyp was found in the recto-sigmoid colon.  ?                          The polyp  was pedunculated. The polyp was removed  ?                          with a hot snare. Resection and retrieval were  ?                          complete. ?                          Repeat examination of right colon under NBI  ?                          performed. ?                          The exam was otherwise without abnormality on  ?  direct and retroflexion views. ?Complications:            No immediate complications. ?Estimated Blood Loss:     Estimated blood loss was minimal. ?Impression:               - One 5 mm polyp in the distal transverse colon,  ?                          removed with a cold snare. Resected and retrieved. ?                          - One 5 mm polyp at the recto-sigmoid colon,  ?                          removed with a hot snare. Resected and retrieved. ?                          - The examination was otherwise normal on direct  ?                          and retroflexion views. ?Recommendation:           - Patient has a contact number available for  ?                          emergencies. The signs and symptoms of potential  ?                          delayed complications were discussed with the  ?                          patient. Return to normal activities tomorrow.  ?                          Written discharge instructions were provided to the  ?                          patient. ?                          - Resume previous diet. ?                          - Continue present medications. ?                          - Await pathology results. ?                          - Repeat colonoscopy in 3 years for surveillance. ?Sparsh Callens L. Loletha Carrow, MD ?06/10/2021 4:40:45 PM ?This report has been signed electronically. ?

## 2021-06-10 NOTE — Progress Notes (Signed)
Pt non-responsive, VVS, Report to RN  °

## 2021-06-10 NOTE — Progress Notes (Signed)
History and Physical: ? This patient presents for endoscopic testing for: ?Encounter Diagnosis  ?Name Primary?  ? Personal history of colonic polyps Yes  ? ? ?Diminutive tubular adenoma last colonoscopy September 2021, fair preparation that exam despite 2-day prep with MiraLAX and Suprep. ? ?On this occasion, patient did MiraLAX for 2 days followed by a split dose PEG prep. ?Reported lifelong constipation. ? ?Patient is otherwise without complaints or active issues today. ? ? ?Past Medical History: ?Past Medical History:  ?Diagnosis Date  ? Allergy   ? Anemia, iron deficiency   ? Anhedonia   ? Anxiety   ? Asthma   ? Asthma, chronic   ? Back pain   ? CAD (coronary artery disease)   ? Cervical spondylosis 06/13/2020  ? Chewing difficulty   ? Chronic insomnia   ? Chronic kidney disease   ? CKD stage 3 A   ? Combined hyperlipidemia   ? Constipation   ? Depression   ? Diabetes mellitus   ? Diabetes mellitus type II   ? Diabetic autonomic neuropathy ([place])   ? Difficulty swallowing pills   ? DM type 2 with diabetic peripheral neuropathy ([place])   ? Dry mouth   ? Dyspepsia   ? Dyspnea   ? Elevated homocysteine   ? Essential tremor   ? Fatigue   ? Fatty liver   ? GERD (gastroesophageal reflux disease)   ? GERD (gastroesophageal reflux disease)   ? Glossitis   ? Goiter   ? Hemorrhoids   ? HLD (hyperlipidemia)   ? Hyperparathyroidism , secondary, non-renal ([place])   ? Hypertension   ? Hypocalcemia   ? Insomnia   ? Leg edema   ? Leg pain   ? Macular degeneration   ? Nonproliferative diabetic retinopathy associated with type 2 diabetes mellitus ([place])   ? RESOLVED  ? Obesity, Class III, BMI 40-49.9 (morbid obesity) ([place])   ? Osteopenia   ? Osteoporosis   ? Pallor   ? PONV (postoperative nausea and vomiting)   ? Status post gastric surgery   ? Tachycardia   ? Thyroiditis, autoimmune   ? Vaginal dryness, menopausal   ? Vitamin D deficiency   ? ? ? ?Past Surgical History: ?Past Surgical History:  ?Procedure Laterality Date  ? BREAST  REDUCTION SURGERY Bilateral 12/14/2017  ? Procedure: BILATERAL MAMMARY REDUCTION  (BREAST);  Surgeon: Irene Limbo, MD;  Location: [facility];  Service: Plastics;  Laterality: Bilateral;  ? CARDIAC CATHETERIZATION    ? x 2  ? COLONOSCOPY  09/2012  ? REDUCTION MAMMAPLASTY  11/2017  ? ROUX-EN-Y PROCEDURE    ? TONSILLECTOMY    ? ? ?Allergies: ?Allergies  ?Allergen Reactions  ? Theophyllines Palpitations  ? ? ?Outpatient Meds: ?Current Outpatient Medications  ?Medication Sig Dispense Refill  ? Blood Glucose Monitoring Suppl (FREESTYLE LITE) DEVI Use to check glucose 3x daily 2 each 5  ? carvedilol (COREG) 3.125 MG tablet Take 1 tablet (3.125 mg total) by mouth 2 (two) times daily with a meal. 120 tablet 10  ? Cholecalciferol 50 MCG (2000 UT) CAPS Take 1 capsule by mouth daily.    ? DULoxetine (CYMBALTA) 60 MG capsule Take 1 capsule (60 mg total) by mouth daily. 90 capsule 3  ? levothyroxine (SYNTHROID) 25 MCG tablet TAKE 1 TABLET BY MOUTH ONCE DAILY 90 tablet 1  ? telmisartan (MICARDIS) 40 MG tablet TAKE 1/2 TABLET BY MOUTH TWO TIMES DAILY 90 tablet 3  ? albuterol (PROVENTIL) (2.5 MG/3ML) 0.083% nebulizer  solution Take 3 mLs (2.5 mg total) by nebulization every 6 (six) hours as needed for wheezing or shortness of breath. 150 mL 1  ? albuterol (VENTOLIN HFA) 108 (90 Base) MCG/ACT inhaler Inhale 2 puffs into the lungs every 6 (six) hours as needed for wheezing or shortness of breath. 6.7 g 11  ? albuterol (VENTOLIN HFA) 108 (90 Base) MCG/ACT inhaler Inhale 2 puffs into the lungs every 6 (six) hours as needed for wheezing or shortness of breath. 8 g 0  ? benzonatate (TESSALON) 100 MG capsule Take 1 capsule (100 mg total) by mouth 3 (three) times daily as needed for cough. 30 capsule 0  ? betamethasone valerate ointment (VALISONE) 0.1 % Apply 1 application topically 2 (two) times daily. 30 g 1  ? calcium-vitamin D (OSCAL WITH D) 500-200 MG-UNIT TABS tablet Take by mouth.    ? conjugated estrogens  (PREMARIN) vaginal cream Place 1 applicatorful vaginally at bedtime for 21 days then off for 7 days. 30 g 11  ? Cyanocobalamin (VITAMIN B 12) 100 MCG LOZG Take by mouth.    ? cyclobenzaprine (FLEXERIL) 10 MG tablet Take 1 tablet (10 mg total) by mouth 3 (three) times daily as needed for muscle spasms. 90 tablet 0  ? doxycycline (VIBRA-TABS) 100 MG tablet Take 1 tablet (100 mg total) by mouth 2 (two) times daily. 20 tablet 0  ? Efinaconazole 10 % SOLN Apply 1 drop topically daily. 4 mL 11  ? Ferrous Sulfate (SLOW FE PO) Take 1 tablet by mouth daily.     ? glucose blood (FREESTYLE LITE) test strip Use 1 to check blood glucose 3 times daily. 300 each 1  ? HYDROcodone bit-homatropine (HYCODAN) 5-1.5 MG/5ML syrup Take 5 mLs by mouth every 8 (eight) hours as needed for cough. 120 mL 0  ? ibuprofen (ADVIL,MOTRIN) 200 MG tablet Take 600 mg by mouth daily as needed for headache or moderate pain.    ? metFORMIN (GLUCOPHAGE) 1000 MG tablet TAKE 1 TABLET BY MOUTH 2 TIMES DAILY WITH A MEAL. (Patient not taking: Reported on 06/10/2021) 180 tablet 0  ? Multiple Vitamin (MULTIVITAMIN) tablet Take 1 tablet by mouth daily. Reported on 02/26/2015 30 tablet 0  ? ondansetron (ZOFRAN) 4 MG tablet Take 1 tablet (4 mg total) by mouth every 8 (eight) hours as needed for nausea or vomiting. 20 tablet 0  ? pantoprazole (PROTONIX) 40 MG tablet TAKE 1 TABLET (40 MG TOTAL) BY MOUTH DAILY. 90 tablet 4  ? Semaglutide, 1 MG/DOSE, (OZEMPIC, 1 MG/DOSE,) 4 MG/3ML SOPN Inject 1 mg into the skin once a week. 3 mL 0  ? traZODone (DESYREL) 50 MG tablet Take 1-2 tablets (50-100 mg total) by mouth at bedtime as needed for sleep 180 tablet 3  ? Vitamin D, Ergocalciferol, (DRISDOL) 50000 units CAPS capsule Take 50,000 Units by mouth every Sunday.    ? ?Current Facility-Administered Medications  ?Medication Dose Route Frequency Provider Last Rate Last Admin  ? 0.9 %  sodium chloride infusion  500 mL Intravenous Once Doran Stabler, MD       ? ? ? ? ?___________________________________________________________________ ?Objective  ? ?Exam: ? ?BP 137/84   Pulse 80   Temp 98.6 ?F (37 ?C)   Ht '5\' 3"'$  (1.6 m)   Wt 144 lb (65.3 kg)   LMP 05/11/2010   SpO2 100%   BMI 25.51 kg/m?  ? ?CV: RRR without murmur, S1/S2 ?Resp: clear to auscultation bilaterally, normal RR and effort noted ?GI: soft, no tenderness, with  active bowel sounds. ? ? ?Assessment: ?Encounter Diagnosis  ?Name Primary?  ? Personal history of colonic polyps Yes  ? ? ? ?Plan: ?Colonoscopy ? The benefits and risks of the planned procedure were described in detail with the patient or (when appropriate) their health care proxy.  Risks were outlined as including, but not limited to, bleeding, infection, perforation, adverse medication reaction leading to cardiac or pulmonary decompensation, pancreatitis (if ERCP).  The limitation of incomplete mucosal visualization was also discussed.  No guarantees or warranties were given. ? ? ? ?The patient is appropriate for an endoscopic procedure in the ambulatory setting. ? ? - Wilfrid Lund, MD ? ? ? ? ?

## 2021-06-10 NOTE — Progress Notes (Signed)
Called to room to assist during endoscopic procedure.  Patient ID and intended procedure confirmed with present staff. Received instructions for my participation in the procedure from the performing physician.  

## 2021-06-12 ENCOUNTER — Telehealth: Payer: Self-pay | Admitting: *Deleted

## 2021-06-12 NOTE — Telephone Encounter (Signed)
  Follow up Call-     06/10/2021    3:11 PM 10/17/2019    7:55 AM  Call back number  Post procedure Call Back phone  # [phone]  Permission to leave phone message Yes Yes     Patient questions:  Do you have a fever, pain , or abdominal swelling? No. Pain Score  0 *  Have you tolerated food without any problems? Yes.    Have you been able to return to your normal activities? Yes.    Do you have any questions about your discharge instructions: Diet   No. Medications  No. Follow up visit  No.  Do you have questions or concerns about your Care? No.  Actions: * If pain score is 4 or above: No action needed, pain <4.

## 2021-06-16 ENCOUNTER — Other Ambulatory Visit (HOSPITAL_COMMUNITY): Payer: Self-pay

## 2021-06-17 ENCOUNTER — Encounter: Payer: Self-pay | Admitting: Gastroenterology

## 2021-06-19 ENCOUNTER — Ambulatory Visit: Payer: 59 | Admitting: Podiatry

## 2021-06-19 DIAGNOSIS — B351 Tinea unguium: Secondary | ICD-10-CM

## 2021-06-19 MED ORDER — EFINACONAZOLE 10 % EX SOLN: 1.0000 [drp] | Freq: Every day | CUTANEOUS | 11 refills | Status: DC

## 2021-06-19 NOTE — Patient Instructions (Signed)
Efinaconazole Topical Solution What is this medication? EFINACONAZOLE (e FEE na KON a zole) is an antifungal medicine. It is used to treat certain kinds of fungal infections of the toenail. This medicine may be used for other purposes; ask your health care provider or pharmacist if you have questions. COMMON BRAND NAME(S): JUBLIA What should I tell my care team before I take this medication? They need to know if you have any of these conditions: an unusual or allergic reaction to efinaconazole, other medicines, foods, dyes or preservatives pregnant or trying to get pregnant breast-feeding How should I use this medication? This medicine is for external use only. Do not take by mouth. Follow the directions on the label. Wash hands before and after use. Apply this medicine using the provided brush to cover the entire toenail. Do not use your medicine more often than directed. Finish the full course prescribed by your doctor or health care professional even if you think your condition is better. Do not stop using except on the advice of your doctor or health care professional. Talk to your pediatrician regarding the use of this medicine in children. While this drug may be prescribed for children as young as 6 years for selected conditions, precautions do apply. Overdosage: If you think you have taken too much of this medicine contact a poison control center or emergency room at once. NOTE: This medicine is only for you. Do not share this medicine with others. What if I miss a dose? If you miss a dose, use it as soon as you can. If it is almost time for your next dose, use only that dose. Do not use double or extra doses. What may interact with this medication? Interactions have not been studied. Do not use any other nail products (i.e., nail polish, pedicures) during treatment with this medicine. This list may not describe all possible interactions. Give your health care provider a list of all the  medicines, herbs, non-prescription drugs, or dietary supplements you use. Also tell them if you smoke, drink alcohol, or use illegal drugs. Some items may interact with your medicine. What should I watch for while using this medication? Do not get this medicine in your eyes. If you do, rinse out with plenty of cool tap water. Tell your doctor or health care professional if your symptoms do not start to get better or if they get worse. Wait for at least 10 minutes after bathing before applying this medication. After bathing, make sure that your feet are very dry. Fungal infections like moist conditions. Do not walk around barefoot. To help prevent reinfection, wear freshly washed cotton, not synthetic clothing. Tell your doctor or health care professional if you develop sores or blisters that do not heal properly. If your nail infection returns after you stop using this medicine, contact your doctor or health care professional. What side effects may I notice from receiving this medication? Side effects that you should report to your doctor or health care professional as soon as possible: allergic reactions like skin rash, itching or hives, swelling of the face, lips, or tongue ingrown toenail Side effects that usually do not require medical attention (report to your doctor or health care professional if they continue or are bothersome): mild skin irritation, burning, or itching This list may not describe all possible side effects. Call your doctor for medical advice about side effects. You may report side effects to FDA at 1-800-FDA-1088. Where should I keep my medication? Keep out of the   reach of children. Store at room temperature between 20 and 25 degrees C (68 and 77 degrees F). Keep this medicine in the original container. Throw away any unused medicine after the expiration date. This medicine is flammable. Avoid exposure to heat, fire, flame, and smoking. NOTE: This sheet is a summary. It may  not cover all possible information. If you have questions about this medicine, talk to your doctor, pharmacist, or health care provider.  2023 Elsevier/Gold Standard (2018-05-25 00:00:00)  

## 2021-06-21 NOTE — Progress Notes (Signed)
Subjective: 61 year old female presents the office today for follow-up evaluation of nail fungus.  She has not been put anything on the nail since I last saw her and she feels that the right nail fungus is growing down more into the nail.  No pain in the nails and no swelling redness or any drainage.  No other concerns.  Objective: AAO x3, NAD DP/PT pulses palpable bilaterally, CRT less than 3 seconds Nails are mildly hypertrophic, dystrophic with yellow discoloration.  Mild brown discoloration noted as well.  No edema, erythema or signs of infection.  The hallux nails are mostly affected. No pain with calf compression, swelling, warmth, erythema  Assessment: Onychomycosis  Plan: -All treatment options discussed with the patient including all alternatives, risks, complications.  -Recommend continue with treatment options.  Refill Jublia.  We will continue to do the nails grow out.  Monitor any signs or symptoms of infection.  Trula Slade DPM

## 2021-07-02 DIAGNOSIS — F39 Unspecified mood [affective] disorder: Secondary | ICD-10-CM | POA: Diagnosis not present

## 2021-07-03 ENCOUNTER — Ambulatory Visit (INDEPENDENT_AMBULATORY_CARE_PROVIDER_SITE_OTHER): Payer: 59 | Admitting: "Endocrinology

## 2021-07-17 ENCOUNTER — Encounter (INDEPENDENT_AMBULATORY_CARE_PROVIDER_SITE_OTHER): Payer: Self-pay | Admitting: Family Medicine

## 2021-07-17 ENCOUNTER — Other Ambulatory Visit (INDEPENDENT_AMBULATORY_CARE_PROVIDER_SITE_OTHER): Payer: Self-pay | Admitting: Nurse Practitioner

## 2021-07-17 ENCOUNTER — Telehealth (INDEPENDENT_AMBULATORY_CARE_PROVIDER_SITE_OTHER): Payer: 59 | Admitting: Family Medicine

## 2021-07-17 ENCOUNTER — Other Ambulatory Visit (HOSPITAL_COMMUNITY): Payer: Self-pay

## 2021-07-17 DIAGNOSIS — Z6829 Body mass index (BMI) 29.0-29.9, adult: Secondary | ICD-10-CM

## 2021-07-17 DIAGNOSIS — N1831 Chronic kidney disease, stage 3a: Secondary | ICD-10-CM

## 2021-07-17 DIAGNOSIS — E669 Obesity, unspecified: Secondary | ICD-10-CM | POA: Diagnosis not present

## 2021-07-17 DIAGNOSIS — E559 Vitamin D deficiency, unspecified: Secondary | ICD-10-CM | POA: Diagnosis not present

## 2021-07-17 DIAGNOSIS — Z6824 Body mass index (BMI) 24.0-24.9, adult: Secondary | ICD-10-CM

## 2021-07-17 DIAGNOSIS — E1122 Type 2 diabetes mellitus with diabetic chronic kidney disease: Secondary | ICD-10-CM

## 2021-07-17 DIAGNOSIS — E1169 Type 2 diabetes mellitus with other specified complication: Secondary | ICD-10-CM

## 2021-07-17 DIAGNOSIS — Z7985 Long-term (current) use of injectable non-insulin antidiabetic drugs: Secondary | ICD-10-CM | POA: Diagnosis not present

## 2021-07-17 MED ORDER — SEMAGLUTIDE (2 MG/DOSE) 8 MG/3ML ~~LOC~~ SOPN
2.0000 mg | PEN_INJECTOR | SUBCUTANEOUS | 1 refills | Status: DC
  Filled 2021-07-17: qty 3, 28d supply, fill #0
  Filled 2021-08-17 – 2021-08-19 (×2): qty 3, 28d supply, fill #1

## 2021-07-17 NOTE — Progress Notes (Signed)
TeleHealth Visit:  This visit was completed with telemedicine (audio/video) technology. Alexa [name] has verbally consented to this TeleHealth visit. The patient is located at home, the provider is located at home. The participants in this visit include the listed provider and patient. The visit was conducted today via MyChart video.  OBESITY Alexa [name] is here to discuss her progress with her obesity treatment plan along with follow-up of her obesity related diagnoses.   Today's visit was # 32 Starting weight: 169 lbs Starting date: 11/16/2017 Weight at last in office visit: 137 lbs on 06/04/21 Total weight loss: 32 lbs at last in office visit on 06/04/21. Today's reported weight: 140 lbs   Her desired weight is 135 pounds.  Nutrition Plan: practicing portion control and making smarter food choices, such as increasing vegetables and decreasing simple carbohydrates.  Hunger is moderately controlled. Cravings are moderately controlled.  Current exercise: Walking and exercise bands a few times per week.  Interim History:  Armeda notes that her blood sugars are trending up and she has gained a bit of weight recently.  She is in weight maintenance and her goal is 135 pounds (23 BMI).  She generally follows up at our clinic about every 8 weeks. She is on portion control/smart choices but does shoot for 80 g of protein as instructed in the past.  She meets this goal two thirds of the time. She is not drinking much soda.  Soda intake has been an issue in the past.  She is exercising more regularly and has incorporated some resistance with exercise bands.   Assessment/Plan:  1. Type II Diabetes HgbA1c is slightly above goal at 7.  CBGs have trended up.  DM managed by Dr. Baird Cancer and she has an appointment July 26. CBGs: Fasting 120s, does not generally check sugars after meals. Episodes of hypoglycemia? no Medication(s): She had been on Wegovy 2.4 mg up until May along with metformin but  was having some hypoglycemia.  Currently she is only on Ozempic 1 mg but notes increasing blood sugars and weight. She has issues with compliance with metformin.  Lab Results  Component Value Date   HGBA1C 7.0 (A) 02/05/2021   HGBA1C 7.6 (A) 10/01/2020   HGBA1C 7.5 (H) 05/16/2020   Lab Results  Component Value Date   MICROALBUR 1.8 05/16/2020   LDLCALC 127 (H) 05/16/2020   CREATININE 1.04 (H) 02/05/2021    Plan: Sent refill for Ozempic 2 mg weekly.  She is to take 60 clicks which will equal about 1.5 mg weekly. Follow-up with Dr. Fransico Michael on July 26. Semaglutide, 2 MG/DOSE, 8 MG/3ML SOPN   Sig: Inject 2 mg as directed once a week.   Dispense:  3 mL   Refill:  1    2. Vitamin D Deficiency Vitamin D was slightly below goal in January 2023-it was 45. She is on weekly OTC Vitamin D 50,000 IU plus 2000 IU OTC vitamin D daily Lab Results  Component Value Date   VD25OH 45 02/05/2021   VD25OH 71 03/27/2019   VD25OH 64 11/01/2017    Plan: Continue OTC vitamin D 50,000 IU weekly and OTC vitamin D 2000 IU daily.   3. Obesity: Current BMI 24.2 Alexa [name] is currently in the action stage of change. As such, her goal is to continue with weight loss efforts.  She has agreed to practicing portion control and making smarter food choices, such as increasing vegetables and decreasing simple carbohydrates.  She will track protein with a goal of 80 g/day.  Exercise goals: as is  Behavioral modification strategies: increasing lean protein intake, decreasing simple carbohydrates, decreasing liquid calories, and planning for success.  Alexa [name] has agreed to follow-up with our clinic in 8 weeks.  We will call back to make the appointment with Irene Limbo, NP because she does not have her work schedule yet.  No orders of the defined types were placed in this encounter.   Medications Discontinued During This Encounter  Medication Reason   Semaglutide, 1 MG/DOSE, (OZEMPIC, 1 MG/DOSE,) 4  MG/3ML SOPN Dose change     Meds ordered this encounter  Medications   Semaglutide, 2 MG/DOSE, 8 MG/3ML SOPN    Sig: Inject 2 mg as directed once a week.    Dispense:  3 mL    Refill:  1    Order Specific Question:   Supervising Provider    Answer:   Quillian Quince D [[id number]]      Objective:   VITALS: Per patient if applicable, see vitals. GENERAL: Alert and in no acute distress. CARDIOPULMONARY: No increased WOB. Speaking in clear sentences.  PSYCH: Pleasant and cooperative. Speech normal rate and rhythm. Affect is appropriate. Insight and judgement are appropriate. Attention is focused, linear, and appropriate.  NEURO: Oriented as arrived to appointment on time with no prompting.   Lab Results  Component Value Date   CREATININE 1.04 (H) 02/05/2021   BUN 19 02/05/2021   NA 137 02/05/2021   K 4.5 02/05/2021   CL 104 02/05/2021   CO2 23 02/05/2021   Lab Results  Component Value Date   ALT 29 02/05/2021   AST 25 02/05/2021   ALKPHOS 85 05/27/2016   BILITOT 0.5 02/05/2021   Lab Results  Component Value Date   HGBA1C 7.0 (A) 02/05/2021   HGBA1C 7.6 (A) 10/01/2020   HGBA1C 7.5 (H) 05/16/2020   HGBA1C 8.5 (A) 12/11/2019   HGBA1C 7.2 (A) 09/08/2019   Lab Results  Component Value Date   INSULIN 8.1 11/16/2017   Lab Results  Component Value Date   TSH 2.29 02/05/2021   Lab Results  Component Value Date   CHOL 210 (H) 05/16/2020   HDL 65 05/16/2020   LDLCALC 127 (H) 05/16/2020   TRIG 84 05/16/2020   CHOLHDL 3.2 05/16/2020   Lab Results  Component Value Date   WBC 5.8 02/05/2021   HGB 12.9 02/05/2021   HCT 38.9 02/05/2021   MCV 84.0 02/05/2021   PLT 361 02/05/2021   Lab Results  Component Value Date   IRON 73 02/05/2021   TIBC 357 04/14/2012   Lab Results  Component Value Date   VD25OH 45 02/05/2021   VD25OH 71 03/27/2019   VD25OH 64 11/01/2017    Attestation Statements:   Reviewed by clinician on day of visit: allergies, medications, problem  list, medical history, surgical history, family history, social history, and previous encounter notes.

## 2021-08-01 ENCOUNTER — Other Ambulatory Visit (HOSPITAL_COMMUNITY): Payer: Self-pay

## 2021-08-05 ENCOUNTER — Ambulatory Visit (INDEPENDENT_AMBULATORY_CARE_PROVIDER_SITE_OTHER): Payer: 59 | Admitting: Nurse Practitioner

## 2021-08-18 ENCOUNTER — Other Ambulatory Visit (HOSPITAL_COMMUNITY): Payer: Self-pay

## 2021-08-19 ENCOUNTER — Other Ambulatory Visit (HOSPITAL_COMMUNITY): Payer: Self-pay

## 2021-08-20 ENCOUNTER — Encounter (INDEPENDENT_AMBULATORY_CARE_PROVIDER_SITE_OTHER): Payer: Self-pay | Admitting: "Endocrinology

## 2021-08-20 ENCOUNTER — Ambulatory Visit (INDEPENDENT_AMBULATORY_CARE_PROVIDER_SITE_OTHER): Payer: 59 | Admitting: "Endocrinology

## 2021-08-20 ENCOUNTER — Other Ambulatory Visit (HOSPITAL_COMMUNITY): Payer: Self-pay

## 2021-08-20 VITALS — BP 120/72 | HR 91 | Wt 137.2 lb

## 2021-08-20 DIAGNOSIS — K219 Gastro-esophageal reflux disease without esophagitis: Secondary | ICD-10-CM

## 2021-08-20 DIAGNOSIS — E211 Secondary hyperparathyroidism, not elsewhere classified: Secondary | ICD-10-CM

## 2021-08-20 DIAGNOSIS — E559 Vitamin D deficiency, unspecified: Secondary | ICD-10-CM | POA: Diagnosis not present

## 2021-08-20 DIAGNOSIS — E11649 Type 2 diabetes mellitus with hypoglycemia without coma: Secondary | ICD-10-CM

## 2021-08-20 DIAGNOSIS — I10 Essential (primary) hypertension: Secondary | ICD-10-CM | POA: Diagnosis not present

## 2021-08-20 DIAGNOSIS — R000 Tachycardia, unspecified: Secondary | ICD-10-CM

## 2021-08-20 DIAGNOSIS — E1143 Type 2 diabetes mellitus with diabetic autonomic (poly)neuropathy: Secondary | ICD-10-CM

## 2021-08-20 DIAGNOSIS — K3184 Gastroparesis: Secondary | ICD-10-CM | POA: Diagnosis not present

## 2021-08-20 DIAGNOSIS — E1140 Type 2 diabetes mellitus with diabetic neuropathy, unspecified: Secondary | ICD-10-CM | POA: Diagnosis not present

## 2021-08-20 DIAGNOSIS — G479 Sleep disorder, unspecified: Secondary | ICD-10-CM

## 2021-08-20 DIAGNOSIS — M818 Other osteoporosis without current pathological fracture: Secondary | ICD-10-CM

## 2021-08-20 DIAGNOSIS — E063 Autoimmune thyroiditis: Secondary | ICD-10-CM

## 2021-08-20 DIAGNOSIS — R4584 Anhedonia: Secondary | ICD-10-CM

## 2021-08-20 LAB — POCT GLYCOSYLATED HEMOGLOBIN (HGB A1C): Hemoglobin A1C: 7.6 % — AB (ref 4.0–5.6)

## 2021-08-20 LAB — POCT GLUCOSE (DEVICE FOR HOME USE): Glucose Fasting, POC: 127 mg/dL — AB (ref 70–99)

## 2021-08-20 MED ORDER — METFORMIN HCL 500 MG PO TABS
500.0000 mg | ORAL_TABLET | Freq: Two times a day (BID) | ORAL | 5 refills | Status: DC
  Filled 2021-08-20 – 2021-09-03 (×2): qty 60, 30d supply, fill #0
  Filled 2021-10-23: qty 60, 30d supply, fill #1
  Filled 2021-11-17: qty 60, 30d supply, fill #2
  Filled 2022-01-23: qty 60, 30d supply, fill #3

## 2021-08-20 NOTE — Patient Instructions (Signed)
Follow up visit on 10/20/21 as planned.   At [facility], we are committed to providing exceptional care. You will receive a patient satisfaction survey through text or email regarding your visit today. Your opinion is important to me. Comments are appreciated.

## 2021-08-20 NOTE — Progress Notes (Incomplete)
Subjective:  Patient Name: Alexa [name] Date of Birth: 12/17/1960  MRN: [id number]  Alexa [name]  presents to the office today for follow-up of her type 2 diabetes mellitus, obesity, combined hyperlipidemia, GERD, hypertension, ASHD, dyspepsia, pedal edema, non-proliferative diabetic retinopathy, goiter, depression, autonomic neuropathy, tachycardia, peripheral neuropathy, acquired hypothyroidism, vitamin D deficiency, hypocalcemia, secondary hyperparathyroidism, glossitis, pallor, fatigue, iron deficiency anemia, anhedonia, disinclination to take medicines, and status post Roux-en-Y gastric bypass.   HISTORY OF PRESENT ILLNESS:   Alexa [name] is a 61 y.o. Caucasian woman. Alexa [name] was unaccompanied.  1. The patient first presented to me on 08/20/04 in referral from her primary care internist, Dr. Emeline General, for evaluation and management of her type 2 diabetes, obesity, and multiple medical issues. She was 61 years old.   [name] had a long history of obesity. She was heavy as a child. She underwent menarche at age 62. At age 29 she was 180 pounds. In 1996 she weighed 218 pounds. In 1999 she was diagnosed with type 2 diabetes mellitus. Her weight at that point was 250 pounds. She was treated with Glucophage, and Actos. Actos made her gain even more weight. She had also been treated with glipizide in the past. More recently she had been treated with Lantus and Glucophage plus regular insulin as needed, especially for when she took steroids for asthma attacks. Maximum weight had been 296 pounds one month prior to that first visit with me. Her tendency to gain weight and her difficulty in losing weight were aggravated by long-standing, intermittent depression and by severe, recurrent asthma requiring the use of steroid medications.  B. Past medical history was also positive for a 60% blockage in one of her coronary arteries. She had significant issues with GERD and dyspepsia. She also had combined  hyperlipidemia. She had a previous cardiac catheterization and previous tonsillectomy. She was allergic to theophylline. Her pertinent review of systems was positive for some numbness and tingling in her feet. Family history was positive for type 2 diabetes in her father and her maternal grandmother. Her brother weighed 250 pounds at a height of 76 inches. Both her mother and her sister were hypothyroid.  C. On physical examination, her weight was 279.9 pounds. Her BMI was 49.6. Her blood pressure was 142/86. Her heart rate was 96. Her hemoglobin A1c was 9.8%. She was alert and oriented x3. Her affect was normal. Her insight was fair. She was obviously quite obese. She had a 25 gm thyroid gland. She had 1+ tremor of her hands. She had 1+ DP pulses and 2+ tinea pedis. Sensation to touch was intact in her feet. Laboratory data included a normal CMP. Her cholesterol was 175, triglycerides 76, HDL 53, and LDL 107. Her TSH was 2.98. Since she obviously did have type 2 diabetes mellitus associated with morbid obesity, and since weight loss was a major factor for her, I asked her to resume her metformin twice daily. I also started her on Byetta, initially 5 mcg twice daily and later 10 mcg twice daily. I discontinued her Lantus insulin.  2. During the last 16 years,we have had some successes, some failures, and some new problem areas.  A. T2DM: In October 2007 the combination of Byetta and metformin was causing more gastrointestinal problems. She opted to stop the metformin and continue the Byetta because it was helping her with weight control and blood sugar control. By 08/11/06 her weight had decreased to 267.6 pounds. Hemoglobin A1c was 8.6%. At that point  she decided to have bariatric surgery. She had a Roux-en-Y gastric bypass on 12/20/2006. She subsequently lost weight down to 173.4 pounds on 05/23/08, but then subsequently regained weight to the 190s. Her  hemoglobin A1c values varied in parallel with her weights.  On 05/23/08, at the point of her lowest weight, her hemoglobin A1c dropped to a nadir of 6.3%.  Since then her hemoglobin A1c values have varied between 6.6 and 13.4%. Although we were initially able to stop all of her diabetes medicines after bariatric surgery, when she began to regain weight we restarted metformin, 500 mg twice daily. Although she took her metformin twice daily for a long time, she had discontinued it many years ago. She hated to take medicines and could not make herself be compliant in taking medications or exercising. When her HbA1c increased to 9.4% in September 2015, I stopped her Byetta injections and started her on liraglutide (Victoza) daily injections on 11/16/13. After several more years of noncompliance, she has been doing much better in the past 2 years.   B. Vitamin D deficiency, hypocalcemia, and secondary hyperparathyroidism: Just before her gastric bypass, I obtained baseline bone mineral metabolism studies. Her 25-hydroxy vitamin D was very low at 7 (normal greater than or equal to 30). Her calcium was 9.5 (normal 8.6-10.6). Her parathyroid hormone was 38.4 (normal 14-72). Subsequent to her surgery, the patient was supposed to be taking multivitamins with calcium and vitamin D, but did not always do so. On 03/29/2007 her 25-hydroxy vitamin D was 29, but her calcium decreased to 8.7. Her PTH was slightly elevated at 73.5. Her 1,25-hydroxy vitamin D was 46. Her iron was 56. I asked her to make sure she took her multivitamins and calcium daily. Unfortunately, she has often been non-compliant with her multivitamins and calcium. Her 25-hydroxy vitamin D values have varied between 15-35. Her calcium values have varied between 8.1-9.4. Her PTH values have remained elevated between 79-167. Alexa [name]'s iron levels have also been low, between 32-34. She was supposed to be taking iron every day, but frequently did not do so. In the past year, however, she has been doing better at taking her  vitamin D, calcium, and iron. In May 2019 her vitamin D was at the lower end of the reference range, her calcium was about the 40% of the reference range, but her PTH was still significantly elevated. In December 2020 her PTH and calcium were mid-normal.   C. Karilynn has had psychological issues for many years that have adversely affected her eating, her unwillingness to exercise, her noncompliance with taking medications, her weight, and her T2DM care. Fortunately, her recent outpatient psychological therapy with Ms. Rea College, RN, MS and her taking Lexapro regularly have resulted in [place] having a much more positive attitude about life in general and a generally more positive attitude about taking her medications. Fortunately, [facility] has been dealing successfully with her issues and her compliance has markedly improved.   D. Obesity: Although Lawrencia regained a great deal of weight in the years following her bariatric surgery, she has been taking Contrave more recently and has been able to lose all of the weight that she had gained after her bariatric surgery.   3. Lizzeth's last [facility] visit was on 02/05/21. At that visit I asked her to continue her morning dose of metformin of 1000 mg and continue the evening dose of 1000 mg. I continued her 25 mcg Synthroid dose at bedtime. I asked her to take one Biotech, 50,000 IU  capsule of vitamin D weekly and calcium, 600 mg, at each meal.   A. In the interim she has been having more pain in the shoulders and back. She had PT and the left shoulder improved, but is worse now. She also had a severe URI for a week after Xmas and did not work for a week.   B. She has been much more anxious, in part due to the pain. Dr. Renold Genta prescribed Xanax. [place] has since then seen Ms. Deloria Lair, NP,  who put her on Cymbalta. Jailine's pain and anxiety deceased markedly.  C. Her father had stroke recently, which has been a big stressor for Vora. She is spending more time  with him and her mother.  D. She has lost 11 pounds since her last visit while taking Wegovy 2.6 mg weekly and has lost muscle mass. She feels weak. She is followed at [facility] monthly. It has been recommended that she stop the [facility] and convert to [place] or Victoza. She prefers the once weekly Ozempic. She had her last dose of Wegovy on 02/04/20. She will start Ozempic, 1 mg/week sq next Monday.  E.  She stopped taking  all of her other medications, including: metformin, 1000 mg twice daily, Lexapro, 20 mg/day; Synthroid, 25 mcg/day; Micardis, 20 mg, twice daily; Protonix, 40 mg/day, calcium, Coreg, 3.125 mg, twice daily, B12 daily, and Biotech once weekly.  F. She is also not taking oral fungal treatment for her tinea pedis.  G. She pretty much stopped drinking cokes.   H. Dr. Renold Genta referred her to nephrology, Dr. Joylene Grapes. Her serum creatinine decreased to 0.92. She was initially diagnosed with stage 3A renal disease. Her renal US in August 2021 was "unremarkable". At her second visit Dr. Joylene Grapes told her not to come back.   I. Her husband has heart failure. He is doing "pretty well". He takes his medicine.   J. She has more shakiness of her hands, especially the left hand. 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   K. She has not been sleeping better with Xanax at bedtime. She does drink coffee on some mornings and tea at times.   L. She has not had much of a problem with allergies or asthma this year.    M. She has continued to have hot flashes.   N. She has not been bothered by pains in her anterior shins, but does have occasional calf cramps.    O. 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 She has not been walking for exercise..    P. She is trying to achieve more balance. She change her work hours to two days per week in late May.    4. Review of Systems:  Constitutional: "My back and shoulders were hurting a lot, but Cymbalta really helps." Marital relations  are going better.   Eyes: Her eyes feel better. Her last eye exam occurred in February or March 2022. She had "the beginnings of diabetes eye disease", but no treatment is needed. She has a follow up visit soon.  Neck: She occasionally has posterior neck and trapezius pains. The patient has no complaints of anterior neck swelling, soreness, tenderness, pressure, or discomfort.  Heart: She has not had chest pain recently. Heart rate increases with exercise or other physical activity. She has no complaints of palpitations or irregular heat beats. She saw Dr. Irish Lack in August 2021. She was told to return if needed. Gastrointestinal: She had a colonoscopy and had  a polyp removed. She no longer has nausea. She rarely  has the sensation of stomach pains. She still occasionally has post-prandial bloating if she eats too much. She always has constipation. She occasionally has head hunger and belly hunger. She is not having acid reflux, upset stomach, stomach aches or pains, swallowing difficulties, diarrhea. Her GI doctor told her to take Miralax daily, but she has not been doing so.  Hands: She has more tremors.  Legs: As above. She no longer has leg cramps after working. She has few complaints of numbness, tingling, or burning. No edema is noted. Feet: There are few complaints of numbness, tingling, burning, or pain. No edema is noted. Tinea improved with her new laser therapy.   Neuro: No new sensory or muscular problems Psych: She is "doing better with Cymbalta".       GU: She has not been urinating a lot. She has not had much nocturia.   GYN: LMP was on 05/11/2010. She had a pap smear in 2020.  Hypoglycemia: She occasionally gets shaky, which she sometimes treats with a regular soda.    5. BG printout: She is not checking BGs.  PAST MEDICAL, FAMILY, AND SOCIAL HISTORY:  Past Medical History:  Diagnosis Date  . Allergy   . Anemia, iron deficiency   . Anhedonia   . Anxiety   . Asthma   . Asthma,  chronic   . Back pain   . CAD (coronary artery disease)   . Cervical spondylosis 06/13/2020  . Chewing difficulty   . Chronic insomnia   . Chronic kidney disease    CKD stage 3 A   . Combined hyperlipidemia   . Constipation   . Depression   . Diabetes mellitus   . Diabetes mellitus type II   . Diabetic autonomic neuropathy ([place])   . Difficulty swallowing pills   . DM type 2 with diabetic peripheral neuropathy ([place])   . Dry mouth   . Dyspepsia   . Dyspnea   . Elevated homocysteine   . Essential tremor   . Fatigue   . Fatty liver   . GERD (gastroesophageal reflux disease)   . GERD (gastroesophageal reflux disease)   . Glossitis   . Goiter   . Hemorrhoids   . HLD (hyperlipidemia)   . Hyperparathyroidism , secondary, non-renal ([place])   . Hypertension   . Hypocalcemia   . Insomnia   . Leg edema   . Leg pain   . Macular degeneration   . Nonproliferative diabetic retinopathy associated with type 2 diabetes mellitus ([place])    RESOLVED  . Obesity, Class III, BMI 40-49.9 (morbid obesity) ([place])   . Osteopenia   . Osteoporosis   . Pallor   . PONV (postoperative nausea and vomiting)   . Status post gastric surgery   . Tachycardia   . Thyroiditis, autoimmune   . Vaginal dryness, menopausal   . Vitamin D deficiency     Family History  Problem Relation Age of Onset  . Thyroid disease Mother        Hypothyroid  . Depression Mother   . Liver disease Mother   . Obesity Mother   . Diabetes Father        T2 DM  . Hypertension Father   . Hyperlipidemia Father   . Heart disease Father   . Stroke Father   . Kidney Stones Father   . Obesity Brother        250 pounds at a height of  76 inches.  . Thyroid disease Sister        Hypothyroid  . Diabetes Maternal Grandmother   . Colon cancer Other        great grandfather  . Liver disease Maternal Aunt        NASH  . Esophageal cancer Neg Hx   . Pancreatic cancer Neg Hx   . Kidney disease Neg Hx   . Colon polyps Neg Hx   .  Rectal cancer Neg Hx   . Stomach cancer Neg Hx      Current Outpatient Medications:  .  albuterol (PROVENTIL) (2.5 MG/3ML) 0.083% nebulizer solution, Take 3 mLs (2.5 mg total) by nebulization every 6 (six) hours as needed for wheezing or shortness of breath., Disp: 150 mL, Rfl: 1 .  albuterol (VENTOLIN HFA) 108 (90 Base) MCG/ACT inhaler, Inhale 2 puffs into the lungs every 6 (six) hours as needed for wheezing or shortness of breath., Disp: 6.7 g, Rfl: 11 .  albuterol (VENTOLIN HFA) 108 (90 Base) MCG/ACT inhaler, Inhale 2 puffs into the lungs every 6 (six) hours as needed for wheezing or shortness of breath., Disp: 8 g, Rfl: 0 .  benzonatate (TESSALON) 100 MG capsule, Take 1 capsule (100 mg total) by mouth 3 (three) times daily as needed for cough., Disp: 30 capsule, Rfl: 0 .  betamethasone valerate ointment (VALISONE) 0.1 %, Apply 1 application topically 2 (two) times daily., Disp: 30 g, Rfl: 1 .  Blood Glucose Monitoring Suppl (FREESTYLE LITE) DEVI, Use to check glucose 3x daily, Disp: 2 each, Rfl: 5 .  calcium-vitamin D (OSCAL WITH D) 500-200 MG-UNIT TABS tablet, Take by mouth., Disp: , Rfl:  .  carvedilol (COREG) 3.125 MG tablet, Take 1 tablet (3.125 mg total) by mouth 2 (two) times daily with a meal., Disp: 120 tablet, Rfl: 10 .  Cholecalciferol 50 MCG (2000 UT) CAPS, Take 1 capsule by mouth daily., Disp: , Rfl:  .  conjugated estrogens (PREMARIN) vaginal cream, Place 1 applicatorful vaginally at bedtime for 21 days then off for 7 days., Disp: 30 g, Rfl: 11 .  Cyanocobalamin (VITAMIN B 12) 100 MCG LOZG, Take by mouth., Disp: , Rfl:  .  cyclobenzaprine (FLEXERIL) 10 MG tablet, Take 1 tablet (10 mg total) by mouth 3 (three) times daily as needed for muscle spasms., Disp: 90 tablet, Rfl: 0 .  doxycycline (VIBRA-TABS) 100 MG tablet, Take 1 tablet (100 mg total) by mouth 2 (two) times daily., Disp: 20 tablet, Rfl: 0 .  DULoxetine (CYMBALTA) 60 MG capsule, Take 1 capsule (60 mg total) by mouth  daily., Disp: 90 capsule, Rfl: 3 .  Efinaconazole 10 % SOLN, Apply 1 drop topically daily., Disp: 4 mL, Rfl: 11 .  Ferrous Sulfate (SLOW FE PO), Take 1 tablet by mouth daily. , Disp: , Rfl:  .  glucose blood (FREESTYLE LITE) test strip, Use 1 to check blood glucose 3 times daily., Disp: 300 each, Rfl: 1 .  HYDROcodone bit-homatropine (HYCODAN) 5-1.5 MG/5ML syrup, Take 5 mLs by mouth every 8 (eight) hours as needed for cough., Disp: 120 mL, Rfl: 0 .  ibuprofen (ADVIL,MOTRIN) 200 MG tablet, Take 600 mg by mouth daily as needed for headache or moderate pain., Disp: , Rfl:  .  levothyroxine (SYNTHROID) 25 MCG tablet, TAKE 1 TABLET BY MOUTH ONCE DAILY, Disp: 90 tablet, Rfl: 1 .  Multiple Vitamin (MULTIVITAMIN) tablet, Take 1 tablet by mouth daily. Reported on 02/26/2015, Disp: 30 tablet, Rfl: 0 .  ondansetron (ZOFRAN) 4 MG  tablet, Take 1 tablet (4 mg total) by mouth every 8 (eight) hours as needed for nausea or vomiting., Disp: 20 tablet, Rfl: 0 .  pantoprazole (PROTONIX) 40 MG tablet, TAKE 1 TABLET (40 MG TOTAL) BY MOUTH DAILY., Disp: 90 tablet, Rfl: 4 .  Semaglutide, 2 MG/DOSE, 8 MG/3ML SOPN, Inject 2 mg as directed once a week., Disp: 3 mL, Rfl: 1 .  telmisartan (MICARDIS) 40 MG tablet, TAKE 1/2 TABLET BY MOUTH TWO TIMES DAILY, Disp: 90 tablet, Rfl: 3 .  traZODone (DESYREL) 50 MG tablet, Take 1-2 tablets (50-100 mg total) by mouth at bedtime as needed for sleep, Disp: 180 tablet, Rfl: 3 .  Vitamin D, Ergocalciferol, (DRISDOL) 50000 units CAPS capsule, Take 50,000 Units by mouth every Sunday., Disp: , Rfl:   Allergies as of 08/20/2021 - Review Complete 07/17/2021  Allergen Reaction Noted  . Theophyllines Palpitations 05/13/2010    1. Work and Family: She has reduced her workload to two days per week during the week.  2. Activities: She is very busy, but is not doing much exercise 3. Smoking, alcohol, or drugs: None 4. Primary Care Provider: Dr. Tommie Ard Baxley 5. Psych Ms. Deloria Lair, NP :  [facility] 6. Bariatrician: Dr. Redgie Grayer, MD/Ms. [name], NP  REVIEW OF SYSTEM: There are no other significant problems involving Prim's other body systems.   Objective:  Vital Signs:  LMP 05/11/2010    Wt Readings from Last 3 Encounters:  06/10/21 144 lb (65.3 kg)  06/04/21 137 lb (62.1 kg)  02/05/21 142 lb (64.4 kg)    Ht Readings from Last 3 Encounters:  06/10/21 5' 3"  (1.6 m)  06/04/21 5' 3"  (1.6 m)  02/05/21 5' 3"  (1.6 m)   There is no height or weight on file to calculate BMI. No height and weight on file for this encounter.  There is no height or weight on file to calculate BSA.   PHYSICAL EXAM:  Constitutional:  Genifer looks fragile today. She has lost 11 pounds since her last visit. Her weight is now much below the post-gastric bypass nadir of 173.4 pounds that it was in 2010. She is alert and bright. Her affect and insight are normal. She understands that when she just stops taking medications, she is hurting herself.  Eyes: There is no arcus or proptosis.  Mouth: The oropharynx appears normal. The tongue appears glossier today. There is normal oral moisture. There is no obvious gingivitis. Neck: There are no bruits present. The thyroid gland appears normal in size. The thyroid gland is again within normal limits at 18-20 grams in size. The left lobe is larger than the right. The consistency of the thyroid gland is normal. There is no thyroid tenderness to palpation. Lungs: The lungs are clear. Air movement is good. Heart: The heart rhythm and rate appear normal. Heart sounds S1 and S2 are normal. I do not appreciate any pathologic heart murmurs. Abdomen: The abdomen is smaller. Bowel sounds are normal. The abdomen is soft and non-tender. There is no obviously palpable hepatomegaly, splenomegaly, or other masses.  Arms: Muscle mass appears appropriate for age.  Hands: She has a 2+ tremor. Phalangeal and metacarpophalangeal joints appear  normal. Palms are normal. Legs: Muscle mass appears appropriate for age. There is no edema. Her shins are not tender to touch today.  Feet: There are no significant deformities. Dorsalis pedis pulses are faint 1+ on the right and 1+ on the left. She has much less visible tinea in her  great toenails.   Neurologic: Muscle strength is normal for age and gender  in both the upper and the lower extremities. Muscle tone appears normal. Sensation to touch is normal in the legs and feet.   LAB DATA:  Labs 02/05/21: HbA1c 7.0%, CBG 151; TSH , free T4 , free T3 ; CMP ; CBC ; iron ; PTH , calcium , 25-OH vitamin D  Labs 10/01/20: HbA1c 7.2%, CBG 131  Labs 07/23/20: CBC normal, except platelets 407 (ref 140-400); B. Burgdorff antibodies, IgM reactive for Lyme disease; RMSF antibodies not detected  Labs 05/16/20: HbA1c 7.5%; TSH 2.09: CMP normal, except glucose 135 and GFR 59; CBC normal; cholesterol 210, triglycerides 84, HDL 65, LDL 145;  Folate 11.9 (ref >5.4); ESR 6 (ref 0-30); Rheumatoid factor <41; cyclic citrulline peptide antibody <16; vitamin B12 1007 (ref [id number]); Urinary microalbumin/creatinine ratio 7; anti-Smith antibody <1; anti-DNA antibody <1; anti-nuclear antibody positive 1:40  Labs 12/11/19: HbA1c 8.5%  Labs 09/08/19: HbA1c 7.2%, CBG 98  Labs 09/04/19: PTH 40, calcium 8.9, 25-OH vitamin D 42  Labs 06/02/19: HbA1c 7.7%, CBG 138  Labs 02/02/19: HbA1c 7.7%, CBG 101  Labs 01/25/19: TSH 1.87, free T4 1.3, free T3 2.6; C-peptide 1.27 (ref 0.80-3.85); CMP normal except creatinine 1.25; cholesterol 169, triglycerides 81, HDL 65, LDL 87; PTH 42 (ref 14-64), calcium 9.4 (ref 8.6-10.4); urinary microalbumin/creatinine ratio 9 (ref <30)   Labs 09/08/18: HbA1c 8.2%, CBG 123  Labs 03/24/18: HbA1c 7.7%, CBG 103  Labs 01/03/18: CBG 112 fasting  Lbs 12/02/17: sodium 134, glucose 322, creatinine 1.17  Labs 11/16/17: Urine microalbumin/creatinine ratio 4.8; B12 282 (ref [id number]), folate 12.7 (ref  >3.0)  Labs 11/01/17: HbA1c 6.9%, CBG 189 post-prandially; PTH 108, calcium 8.6, 25-OH vitamin D 64   Labs 09/13/17: Hepatic function panel normal; CBC normal  Labs 08/09/17: Hepatic function panel normal; CBC normal, except Hct 34.7 (35-45)  Labs 06/14/17: CBG 166  Labs 06/08/17: TSH 1.80, free T4 1.3, free T3 2.3; CMP normal except for glucose 115 and creatinine 1.27; PTH 120, calcium 9.7, 25-OH vitamin D 34;cholesterol 188, triglycerides 110, HDL 67, LDL 100; urine microalbumin/creatinine ratio   Labs 04/12/17: HbA1c 10.0%, CBG 174  Labs 03/04/17: Sodium 131, potassium 4.0, chloride 99, CO2 20, glucose 515, calcium 8.9  Labs 01/11/17: CBG 198  Labs 11/24/16: CBG 468; urine glucose 2000+, negative ketones  Labs 11/17/16: HbA1c 13.4%; TSH 1.54; cholesterol 173, triglycerides 91, HDL 71, LDL 83; 25-OH vitamin D 21; CMP normal except for glucose 314; CBC normal except MCHC 26.5 (ref 27-33)   06/04/16: HBA1c 8.1%, CBG 115  05/27/16: TSH 2.62, free T4 1.1, free T3 2.3; CMP normal except for glucose 161, and creatinine 1.09; cholesterol 187, triglycerides 77, HDL 74, LDL 98; urinary microalbumin/creatinine ratio 6; PTH 112, calcium 9.2, 25-OH vitamin D 33  03/17/16: CBG 145  Labs 12/31/15: HbA1c 13.4%, CBG 390; CMP normal except for glucose 344 and creatinine 1.14  Labs 10/08/15: HbA1c 9.6%  Labs 07/02/15: HbA1c 8.9%; PTH 107, calcium 9.2, 25-OH vitamin D 33; CBC normal; CMP normal except for creatinine 1.07; cholesterol 174, triglycerides 91, HDL 74, LDL 82; TSH 2.43, free T4 1.2, free T3 2.3  Labs 02/19/15: PTH 99, calcium 8.7, 25-OH vitamin D 20  Labs 10/08/14: HbA1c 8.0%   Labs 08/06/14: HbA1c 7.4%  Labs 08/01/14: TSH 2.014, free T4 0.87, free T3 2.4; PTH 79 (normal 14-64), calcium 9.1; 25-OH vitamin D 35 CMP normal  Labs 05/11/14: HbA1c was 7.7%, without any low BGs; TSH 2.416,  free T4 1.13, free T3 2.3  Labs 02/22/14: HbA1c 11.6%  Labs 02/13/14: Calcium 9.0, PTH 131, 25-OH vitamin D 15;  CMP normal except for glucose of 165; cholesterol 177, triglycerides 108, HDL 90, LDL 65; urinary microalbumin/creatinine ratio 15.6; TSH 3.639, free T4 1.30, free T3 2.5  Labs 10/10/13: HbA1c is 9.4%, compared with 8.1% at last visit and with 9.3% in December;   Labs 05/08/13: HbA1c 8.1%; PTH 90.2, calcium 9.5, 25-hydroxy vitamin D 46; CMP normal except for glucose of 215; WBC 8.0, RBC 5.20, Hgb 12.5, Hct 37.8%, MCV 72.7 (normal 78-100), iron 55  Labs 02/23/13: CMP normal, except glucose 194 and alkaline phosphatase 126; microalbumin/creatinine ratio was 11.5; TSH 2.086, free T4 1.29, free T3 3.2, TPO antibody < 10; 25-hydroxy vitamin D 18; C-peptide 1.79; cholesterol 156, triglycerides 64, HDL 61, LDL 82  Labs 01/05/13: Hemoglobin A1c was 9.3%, compared with 9.3 at last visit and with 9.1% at the visit prior. All of these values were major increases from 6.6% in May 2012.    Labs 04/14/12: 25-hydroxy vitamin D 21, iron 203,  Vitamin B12 379,   Labs 11/16/11: Hgb 10.3, Hct 32.4%; CMP normal except glucose 146' cholesterol 201, triglycerides 70, HDL 66, LDL 121; vitamin D 22           Labs 06/09/10: Cholesterol was 175, triglycerides 76, HDL 62, LDL 98. TSH was 1.217. Free T4 was 1.10. Free T3 was 2.7. 25-hydroxy vitamin D was 19. PTH was 167.  IMAGING:  BMD 09/08/19: T-score of forearm radius is -3.8, c/w osteoporosis.    Assessment and Plan:   ASSESSMENT:  1. Type 2 diabetes mellitus:   A. During the past three years her T2DM control has waxed and waned, with HbA1c values varying from 6.9-13.4%.    B. At today's visit she has lost another 11 pounds. Her HbA1c has decreased to 7.0%.     C. My goal is to reduce her HbA1c to <6.5% if we can safely do so.  D. If she will just take her medications and exercise, she can probably  reach that goal.  2. Hypoglycemia: She has had some episodes of shakiness which could have been due to hypoglycemia, to anxiety, or to some other cause.   3. Obesity:  Her weight has decreased 11 pounds since her last visit. She wants to convert to Ozempic. She needs to take her medications, continue to Eat Right and exercise daily.  4-5. Autonomic neuropathy and tachycardia:  A. The patient's autonomic neuropathy and  heart rate improved as her BGs decreased, but worsened as her BGs increased.  B. Although her HbA1c is lower, her heart rate is higher. This change may be due to anxiety or to being severely deconditioned.  6. Hypertension: Her BP is normal today. She still needs to try to fit in more exercise and take her medications.  7-8. Anhedonia and anxiety: She is doing better since starting Cymbalta. She still needs to call for help when she begins to do poorly. She also needs to continue to find ways to have more joy in her life. 9-12. GERD/esophagitis/difficulty swallowing/gastroparesis: Her reflux, other GI symptoms, and difficulty swallowing have all improved.   13-15: Hypocalcemia, vitamin D deficiency, and secondary hyperparathyroidism:   A. Due to her bariatric surgery she has more difficulty absorbing vitamin D and calcium. When she develops hypocalcemia and vitamin D deficiency, her parathyroid glands appropriately produce more PTH, resulting in secondary hyperparathyroidism. However, if she takes her calcium and vitamin  D all three parameters will normalize.   B. Her calcium was normal in October 2018, but at the lower limit of normal in February 2019. Her vitamin D level was low-normal in May 2019.   C. In May 2019 her PTH was elevated to 120 and her calcium was 9.7. She still had secondary hyperparathyroidism. In October 2019 her calcium was at the low end of the reference range and her PTH was still elevated, but lower.  D. Her PTH level in August 2021 was mid-normal. Her calcium was at the low end of the reference range, because she was taking less calcium than she had intended. Her vitamin D level was good. She needed to increase her calcium intake  to 600 mg, three times daily and take her vitamin D and calcium very consistently.   E. Since her last visit she stopped taking calcium and vitamin D again. She needs to resume taking the calcium and vitamin D. Two Tums per day will probably be adequate.  33. Noncompliance: Since last visit her compliance with eating right and taking her medications has generally been worse.   17: Muscle cramps: Her cramps occur less frequently now.  18. Hypothyroidism: Her TFTs in May 2019 and in December 2020 were normal. Her TSH in April 2022 was normal. Her current levothyroxine dosage is working well if she takes it. 19. Sleeping difficulties: These problems may have worsened. I suspect that her anxiety is causing much of these problems.  20. Osteopenia/osteoporosis: Foye's secondary hyperparathyroidism had normalized. She had a new BMD study in August 2021. Her osteoporosis was somewhat worse. She needs to take her calcium and vitamin D.   PLAN:  1. Diagnostic: I reviewed her lab results from September 2022 and January 2023. I ordered TFTs, CMP, PTH, 25-OH vitamin D, CBC, iron..    2. Therapeutic: Resume 25 mcg Synthroid at bedtime, 600 mg of calcium at mealtimes, [employer], 50,000 IU per week, plus 2000 IU daily, Coreg, Micardis.  Try to fit in exercise daily.  Eat meals that are relatively high in protein and relatively low in carbs. Eat Right Diet. Start Ozempic, 1 mg, sq weekly. Check BGs 1-2 times er day.  3. Patient education: We discussed all of the above at great length. She must continue to take her medications and supplements. She also needs to see me every 2 months for now. When she knows that she is coming to see "[facility]", her compliance sometimes improves.  4. Follow-up:  2 months   Level of Service: This visit lasted in excess of 55 minutes. More than 50% of the visit was devoted to counseling.   Tillman Sers, MD, CDE [facility]in[facility] 08/20/2021 12:00  AM

## 2021-08-20 NOTE — Progress Notes (Signed)
Subjective:  Patient Name: Alexa [name] Date of Birth: Oct 16, 1960  MRN: [id number]  Alexa [name]  presents to the office today for follow-up of her type 2 diabetes mellitus, obesity, combined hyperlipidemia, GERD, hypertension, ASHD, dyspepsia, pedal edema, non-proliferative diabetic retinopathy, goiter, depression, autonomic neuropathy, tachycardia, peripheral neuropathy, acquired hypothyroidism, vitamin D deficiency, hypocalcemia, secondary hyperparathyroidism, glossitis, pallor, fatigue, iron deficiency anemia, anhedonia, disinclination to take medicines, and status post Roux-en-Y gastric bypass.   HISTORY OF PRESENT ILLNESS:   [name] is a 61 y.o. Caucasian woman. [name] [name] was unaccompanied.  1. The patient first presented to me on 08/20/04 in referral from her primary care internist, Dr. Emeline General, for evaluation and management of her type 2 diabetes, obesity, and multiple medical issues. She was 61 years old.   Alexa [name] had a long history of obesity. She was heavy as a child. She underwent menarche at age 56. At age 14 she was 180 pounds. In 1996 she weighed 218 pounds. In 1999 she was diagnosed with type 2 diabetes mellitus. Her weight at that point was 250 pounds. She was treated with Glucophage, and Actos. Actos made her gain even more weight. She had also been treated with glipizide in the past. More recently she had been treated with Lantus and Glucophage plus regular insulin as needed, especially for when she took steroids for asthma attacks. Maximum weight had been 296 pounds one month prior to that first visit with me. Her tendency to gain weight and her difficulty in losing weight were aggravated by long-standing, intermittent depression and by severe, recurrent asthma requiring the use of steroid medications.  B. Past medical history was also positive for a 60% blockage in one of her coronary arteries. She had significant issues with GERD and dyspepsia. She also had combined  hyperlipidemia. She had a previous cardiac catheterization and previous tonsillectomy. She was allergic to theophylline. Her pertinent review of systems was positive for some numbness and tingling in her feet. Family history was positive for type 2 diabetes in her father and her maternal grandmother. Her brother weighed 250 pounds at a height of 76 inches. Both her mother and her sister were hypothyroid.  C. On physical examination, her weight was 279.9 pounds. Her BMI was 49.6. Her blood pressure was 142/86. Her heart rate was 96. Her hemoglobin A1c was 9.8%. She was alert and oriented x3. Her affect was normal. Her insight was fair. She was obviously quite obese. She had a 25 gm thyroid gland. She had 1+ tremor of her hands. She had 1+ DP pulses and 2+ tinea pedis. Sensation to touch was intact in her feet. Laboratory data included a normal CMP. Her cholesterol was 175, triglycerides 76, HDL 53, and LDL 107. Her TSH was 2.98. Since she obviously did have type 2 diabetes mellitus associated with morbid obesity, and since weight loss was a major factor for her, I asked her to resume her metformin twice daily. I also started her on Byetta, initially 5 mcg twice daily and later 10 mcg twice daily. I discontinued her Lantus insulin.  2. During the last 17 years,we have had some successes, some failures, and some new problems.  A. T2DM:  1). In October 2007 the combination of Byetta and metformin was causing more gastrointestinal problems. She opted to stop the metformin and continue the Byetta because it was helping her with weight control and blood sugar control. By 08/11/06 her weight had decreased to 267.6 pounds. Hemoglobin A1c was 8.6%. At that  point she decided to have bariatric surgery. 2). She had a Roux-en-Y gastric bypass on 12/20/2006. She subsequently lost weight down to 173.4 pounds on 05/23/08, but then subsequently regained weight to the 190s. Her  hemoglobin A1c values varied in parallel with her  weights. On 05/23/08, at the point of her lowest weight, her hemoglobin A1c dropped to a nadir of 6.3%.  Since then her hemoglobin A1c values have varied between 6.6 and 13.4%.  3). Although we were initially able to stop all of her diabetes medicines after bariatric surgery, when she began to regain weight we restarted metformin, 500 mg twice daily. Although she took her metformin twice daily for a long time, she had discontinued it many years ago. She hated to take medicines and could not make herself be compliant in taking medications or exercising. When her HbA1c increased to 9.4% in September 2015, I stopped her Byetta injections and started her on liraglutide (Victoza) daily injections on 11/16/13. However, she was often noncompliant with Victoza as well.  4). Ms. [name], [place], at [facility] started [place] on [place] in August 2022. Cambria lost weight very nicely. Lorita was converted from [place] to [place] in January 2023.  B. Vitamin D deficiency, hypocalcemia, and secondary hyperparathyroidism:  1). Just before her gastric bypass, I obtained baseline bone mineral metabolism studies. Her 25-hydroxy vitamin D was very low at 7 (normal greater than or equal to 30). Her calcium was 9.5 (normal 8.6-10.6). Her parathyroid hormone was 38.4 (normal 14-72). Subsequent to her surgery, the patient was supposed to be taking multivitamins with calcium and vitamin D, but did not always do so.  2). On 03/29/2007 her 25-hydroxy vitamin D was 29, but her calcium decreased to 8.7. Her PTH was slightly elevated at 73.5. Her 1,25-hydroxy vitamin D was 46. Her iron was 56. I asked her to make sure she took her multivitamins and calcium daily.  3). Unfortunately, she has often been non-compliant with her multivitamins and calcium. Her 25-hydroxy vitamin D values have varied between 15-35. Her calcium values have varied between 8.1-9.4. Her PTH values have remained elevated between 79-167.  4).  Diedre's iron levels have also been low, between 32-34. She was supposed to be taking iron every day, but frequently did not do so.  5). In May 2019 her vitamin D was at the lower end of the reference range, her calcium was about the 40% of the reference range, but her PTH was still significantly elevated. In December 2020 her PTH and calcium were mid-normal.  6). Over the years, her noncompliance with taking medications and supplements has varied significantly In the past year, however, she has been doing better at taking her vitamin D, calcium, and iron. In January 2023 her PTH was high-normal, her calcium was mid-normal, and her vitamin D was normal, but relatively low normal. Unfortunately, she has more recently stopped taking any calcium or vitamin D again.    C. Psychological issues: 1).Anshi has had psychological issues for many years that have adversely affected her eating, her unwillingness to exercise, her noncompliance with taking medications, her weight, and her T2DM care. Fortunately, her recent outpatient psychological therapy with Ms. Rea College, RN, MS and her taking Lexapro regularly had often resulted in [place] having a much more positive attitude about life in general and a generally more positive attitude about taking her medications.  2). She was started on Cymbalta in 2022 and her Lexapro was discontinued. Fortunately, Aidel's has been dealing successfully  with her issues and her compliance has improved for some medications, but not for other medications and supplements..   D. Obesity: Although Abygail lost a great deal of weight in the year following her bariatric surgery, she later re-gained much of that weight. More recently she has ben taking Wegovy or Ozempic and has been losing weight.  E. Elevated serum creatinine:  1). Dr. Renold Genta referred her to nephrology, Dr. Joylene Grapes. Her serum creatinine decreased to 0.92. She was initially diagnosed with stage 3A renal disease. Her  renal US in August 2021 was "unremarkable".  2). At her second visit Dr. Joylene Grapes told her not to come back.  F. Cardiology: She saw Dr. Irish Lack in August 2021. She was told to return if needed  3. Sherika's last [facility] visit was on 02/05/21. At that visit I asked her to continue her morning dose of metformin of 1000 mg and continue the evening dose of 1000 mg. I continued her 25 mcg Synthroid dose at bedtime. I asked her to take one Biotech, 50,000 IU capsule of vitamin D weekly and calcium, 600 mg, at each meal.   A. In the interim she has been healthy, except for contracting covid in February. She had significant asthma problems, so had to have two rounds of steroids and resumed use of her asthma medications.    B. She has continued to lose weight. She was taken off metformin. She was also converted from [facility] to Ozempic 1.5 mg/week.  C. Her shoulder pains improved with Cymbalta, so that medication was discontinued.    D. She has not been very anxious. She was able to discontinue Xanax and is sleeping fairly well. E. She had a colonoscopy on 06/10/21. She had one polyp that was benign.  F. Her father had stroke in late 2022. She and her sister is spending more time with him and her mother.  G. She has lost 7 pounds since converting from [place] to [place].  H. She is walking more and her stamina has improved.  She does not feel as weak. She is followed at [facility].  I. She has been taking all of her other medications, including: Synthroid, 25 mcg/day; Micardis, 20 mg, twice daily, and Coreg, 3.125 mg, twice daily. She stopped taking Lexapro, Protonix, Cymbalta, her calcium supplements, and Biotech weekly.  J. She uses a topical liquid for her tinea pedis. The tinea has much improved.  K. She pretty much stopped drinking cokes.   L. Her cardiologist at [facility] also released her from that practice.    M. Her husband has heart failure. He is doing "fairly well". He takes his  medicine.   N. She has some shakiness of her hands, especially the left hand. 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   O. She has continued to have hot flashes, but much less frequently.   P. She has been having more leg cramps since stopping vitamin D and calcium.  Q. 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 She has not been walking much for exercise..    R. She is trying to achieve more balance in her life. She changed her work hours to two days per week in late May.    4. Review of Systems:  Constitutional: "I'm feeling ;pretty good." Marital relations are going better.   Eyes: Her eyes feel better. Her last eye exam occurred in early 2023. She again had "the beginnings of diabetes eye disease, but nothing is worsening".  Neck: She occasionally has posterior neck and trapezius pains. The patient has no complaints of anterior neck swelling, soreness, tenderness, pressure, or discomfort.  Heart: She has not had chest pain recently. Heart rate increases with exercise or other physical activity. She has no complaints of palpitations or irregular heat beats.  Gastrointestinal: She had a colonoscopy and had a polyp removed. She occasionally has nausea, less since converting form [place] to [place]. She rarely  has the sensation of stomach pains. She still occasionally has post-prandial bloating if she eats too much. She always has constipation. She occasionally has head hunger and belly hunger. She is not having acid reflux, upset stomach, stomach aches or pains, swallowing difficulties, diarrhea. Her GI doctor told her to take Miralax daily, but she has not been doing so.  Hands: She has tremors at times.  Legs: As above. She has more leg cramps after working. Her legs can also feel "heavy" when she is in bed at night. She has few complaints of numbness, tingling, or burning. No edema is noted. Feet: There are few complaints of numbness, tingling, burning, or pain. No edema is noted.  Tinea improved with her new liquid therapy.   Neuro: No new sensory or muscular problems Psych: She is "doing better without Cymbalta".       GU: She has not been urinating a lot. She has not had much nocturia.   GYN: LMP was on 05/11/2010. She had a pap smear in 2020. She will have a follow up visit soon.  Hypoglycemia: She has not had many/any low BG symptoms since converting from [facility] to [place].     5. BG printout: She is not checking BGs.  PAST MEDICAL, FAMILY, AND SOCIAL HISTORY:  Past Medical History:  Diagnosis Date   Allergy    Anemia, iron deficiency    Anhedonia    Anxiety    Asthma    Asthma, chronic    Back pain    CAD (coronary artery disease)    Cervical spondylosis 06/13/2020   Chewing difficulty    Chronic insomnia    Chronic kidney disease    CKD stage 3 A    Combined hyperlipidemia    Constipation    Depression    Diabetes mellitus    Diabetes mellitus type II    Diabetic autonomic neuropathy (HCC)    Difficulty swallowing pills    DM type 2 with diabetic peripheral neuropathy (HCC)    Dry mouth    Dyspepsia    Dyspnea    Elevated homocysteine    Essential tremor    Fatigue    Fatty liver    GERD (gastroesophageal reflux disease)    GERD (gastroesophageal reflux disease)    Glossitis    Goiter    Hemorrhoids    HLD (hyperlipidemia)    Hyperparathyroidism , secondary, non-renal (HCC)    Hypertension    Hypocalcemia    Insomnia    Leg edema    Leg pain    Macular degeneration    Nonproliferative diabetic retinopathy associated with type 2 diabetes mellitus (HCC)    RESOLVED   Obesity, Class III, BMI 40-49.9 (morbid obesity) (HCC)    Osteopenia    Osteoporosis    Pallor    PONV (postoperative nausea and vomiting)    Status post gastric surgery    Tachycardia    Thyroiditis, autoimmune    Vaginal dryness, menopausal    Vitamin D deficiency     Family History  Problem Relation  Age of Onset   Thyroid disease Mother         Hypothyroid   Depression Mother    Liver disease Mother    Obesity Mother    Diabetes Father        T2 DM   Hypertension Father    Hyperlipidemia Father    Heart disease Father    Stroke Father    Kidney Stones Father    Obesity Brother        250 pounds at a height of 76 inches.   Thyroid disease Sister        Hypothyroid   Diabetes Maternal Grandmother    Colon cancer Other        great grandfather   Liver disease Maternal Aunt        NASH   Esophageal cancer Neg Hx    Pancreatic cancer Neg Hx    Kidney disease Neg Hx    Colon polyps Neg Hx    Rectal cancer Neg Hx    Stomach cancer Neg Hx      Current Outpatient Medications:    albuterol (PROVENTIL) (2.5 MG/3ML) 0.083% nebulizer solution, Take 3 mLs (2.5 mg total) by nebulization every 6 (six) hours as needed for wheezing or shortness of breath., Disp: 150 mL, Rfl: 1   albuterol (VENTOLIN HFA) 108 (90 Base) MCG/ACT inhaler, Inhale 2 puffs into the lungs every 6 (six) hours as needed for wheezing or shortness of breath., Disp: 6.7 g, Rfl: 11   albuterol (VENTOLIN HFA) 108 (90 Base) MCG/ACT inhaler, Inhale 2 puffs into the lungs every 6 (six) hours as needed for wheezing or shortness of breath., Disp: 8 g, Rfl: 0   benzonatate (TESSALON) 100 MG capsule, Take 1 capsule (100 mg total) by mouth 3 (three) times daily as needed for cough., Disp: 30 capsule, Rfl: 0   betamethasone valerate ointment (VALISONE) 0.1 %, Apply 1 application topically 2 (two) times daily., Disp: 30 g, Rfl: 1   Blood Glucose Monitoring Suppl (FREESTYLE LITE) DEVI, Use to check glucose 3x daily, Disp: 2 each, Rfl: 5   calcium-vitamin D (OSCAL WITH D) 500-200 MG-UNIT TABS tablet, Take by mouth., Disp: , Rfl:    carvedilol (COREG) 3.125 MG tablet, Take 1 tablet (3.125 mg total) by mouth 2 (two) times daily with a meal., Disp: 120 tablet, Rfl: 10   Cholecalciferol 50 MCG (2000 UT) CAPS, Take 1 capsule by mouth daily., Disp: , Rfl:    conjugated estrogens  (PREMARIN) vaginal cream, Place 1 applicatorful vaginally at bedtime for 21 days then off for 7 days., Disp: 30 g, Rfl: 11   Cyanocobalamin (VITAMIN B 12) 100 MCG LOZG, Take by mouth., Disp: , Rfl:    cyclobenzaprine (FLEXERIL) 10 MG tablet, Take 1 tablet (10 mg total) by mouth 3 (three) times daily as needed for muscle spasms., Disp: 90 tablet, Rfl: 0   doxycycline (VIBRA-TABS) 100 MG tablet, Take 1 tablet (100 mg total) by mouth 2 (two) times daily., Disp: 20 tablet, Rfl: 0   DULoxetine (CYMBALTA) 60 MG capsule, Take 1 capsule (60 mg total) by mouth daily., Disp: 90 capsule, Rfl: 3   Efinaconazole 10 % SOLN, Apply 1 drop topically daily., Disp: 4 mL, Rfl: 11   Ferrous Sulfate (SLOW FE PO), Take 1 tablet by mouth daily. , Disp: , Rfl:    glucose blood (FREESTYLE LITE) test strip, Use 1 to check blood glucose 3 times daily., Disp: 300 each, Rfl: 1   HYDROcodone bit-homatropine (HYCODAN) 5-1.5  MG/5ML syrup, Take 5 mLs by mouth every 8 (eight) hours as needed for cough., Disp: 120 mL, Rfl: 0   ibuprofen (ADVIL,MOTRIN) 200 MG tablet, Take 600 mg by mouth daily as needed for headache or moderate pain., Disp: , Rfl:    Multiple Vitamin (MULTIVITAMIN) tablet, Take 1 tablet by mouth daily. Reported on 02/26/2015, Disp: 30 tablet, Rfl: 0   ondansetron (ZOFRAN) 4 MG tablet, Take 1 tablet (4 mg total) by mouth every 8 (eight) hours as needed for nausea or vomiting., Disp: 20 tablet, Rfl: 0   Semaglutide, 2 MG/DOSE, 8 MG/3ML SOPN, Inject 2 mg as directed once a week., Disp: 3 mL, Rfl: 1   traZODone (DESYREL) 50 MG tablet, Take 1-2 tablets (50-100 mg total) by mouth at bedtime as needed for sleep, Disp: 180 tablet, Rfl: 3   Vitamin D, Ergocalciferol, (DRISDOL) 50000 units CAPS capsule, Take 50,000 Units by mouth every Sunday., Disp: , Rfl:    levothyroxine (SYNTHROID) 25 MCG tablet, TAKE 1 TABLET BY MOUTH ONCE DAILY, Disp: 90 tablet, Rfl: 1   pantoprazole (PROTONIX) 40 MG tablet, TAKE 1 TABLET (40 MG TOTAL) BY  MOUTH DAILY., Disp: 90 tablet, Rfl: 4   telmisartan (MICARDIS) 40 MG tablet, TAKE 1/2 TABLET BY MOUTH TWO TIMES DAILY, Disp: 90 tablet, Rfl: 3  Allergies as of 08/20/2021 - Review Complete 08/20/2021  Allergen Reaction Noted   Theophyllines Palpitations 05/13/2010    1. Work and Family: She has reduced her workload to two days per week during the week.  2. Activities: She is very busy, but is not doing much exercise 3. Smoking, alcohol, or drugs: None 4. Primary Care Provider: Dr. Tommie Ard Baxley 5. Psych Ms. Deloria Lair, NP, [facility] 6. Bariatrician: Dr. Redgie Grayer, MD/Ms. [name], NP, [facility]  REVIEW OF SYSTEM: There are no other significant problems involving Arleny's other body systems.   Objective:  Vital Signs:  BP 120/72   Pulse 91   Wt 137 lb 3.2 oz (62.2 kg)   LMP 05/11/2010   BMI 24.30 kg/m    Wt Readings from Last 3 Encounters:  08/20/21 137 lb 3.2 oz (62.2 kg)  06/10/21 144 lb (65.3 kg)  06/04/21 137 lb (62.1 kg)    Ht Readings from Last 3 Encounters:  06/10/21 5' 3" (1.6 m)  06/04/21 5' 3" (1.6 m)  02/05/21 5' 3" (1.6 m)   Body mass index is 24.3 kg/m.   Body surface area is 1.66 meters squared.   PHYSICAL EXAM:  Constitutional:  Marina looks great today, probably the best that I've ever seen her. She has lost 7 pounds since her last visit. Her weight is now much below the post-gastric bypass nadir of 173.4 pounds that it was in 2010. She is alert and bright. Her affect and insight are normal. She understands that when she just stops taking medications, she is hurting herself.  Eyes: There is no arcus or proptosis.  Mouth: The oropharynx appears normal. The tongue appears glossier today. There is normal oral moisture. There is no obvious gingivitis. Neck: There are no bruits present. The thyroid gland appears normal in size. The thyroid gland is again within normal limits at 18-20 grams in size. The  left lobe is larger than the right. The consistency of the thyroid gland is normal. There is no thyroid tenderness to palpation. Lungs: The lungs are clear. Air movement is good. Heart: The heart rhythm and rate appear normal. Heart sounds S1 and S2  are normal. I do not appreciate any pathologic heart murmurs. Abdomen: The abdomen is smaller. Bowel sounds are normal. The abdomen is soft and non-tender. There is no obviously palpable hepatomegaly, splenomegaly, or other masses.  Arms: Muscle mass appears appropriate for age.  Hands: She has a 2+ tremor. Phalangeal and metacarpophalangeal joints appear normal. Palms are normal. Legs: Muscle mass appears appropriate for age. There is no edema. Her shins are not tender to touch today.  Feet: There are no significant deformities. Dorsalis pedis pulses are faint 1+ on the right and 1+ on the left. She has much less visible tinea in her great toenails.   Neurologic: Muscle strength is normal for age and gender  in both the upper and the lower extremities. Muscle tone appears normal. Sensation to touch is normal in the legs and feet.   LAB DATA:  Labs 08/20/21: HbA1c 7.6%, CBG 127  Labs 02/05/21: HbA1c 7.0%, CBG 151; TSH 2.28, free T4 1.2, free T3 2.8; CMP normal; CBC normal; iron 73 (ref 45-160); PTH 69 (ref 16-77), calcium 9.5, 25-OH vitamin D 45  Labs 10/01/20: HbA1c 7.2%, CBG 131  Labs 07/23/20: CBC normal, except platelets 407 (ref 140-400); B. Burgdorff antibodies, IgM reactive for Lyme disease; RMSF antibodies not detected  Labs 05/16/20: HbA1c 7.5%; TSH 2.09: CMP normal, except glucose 135 and GFR 59; CBC normal; cholesterol 210, triglycerides 84, HDL 65, LDL 145;  Folate 11.9 (ref >5.4); ESR 6 (ref 0-30); Rheumatoid factor <10; cyclic citrulline peptide antibody <16; vitamin B12 1007 (ref [phone]); Urinary microalbumin/creatinine ratio 7; anti-Smith antibody <1; anti-DNA antibody <1; anti-nuclear antibody positive 1:40  Labs 12/11/19: HbA1c  8.5%  Labs 09/08/19: HbA1c 7.2%, CBG 98  Labs 09/04/19: PTH 40, calcium 8.9, 25-OH vitamin D 42  Labs 06/02/19: HbA1c 7.7%, CBG 138  Labs 02/02/19: HbA1c 7.7%, CBG 101  Labs 01/25/19: TSH 1.87, free T4 1.3, free T3 2.6; C-peptide 1.27 (ref 0.80-3.85); CMP normal except creatinine 1.25; cholesterol 169, triglycerides 81, HDL 65, LDL 87; PTH 42 (ref 14-64), calcium 9.4 (ref 8.6-10.4); urinary microalbumin/creatinine ratio 9 (ref <30)   Labs 09/08/18: HbA1c 8.2%, CBG 123  Labs 03/24/18: HbA1c 7.7%, CBG 103  Labs 01/03/18: CBG 112 fasting  Lbs 12/02/17: sodium 134, glucose 322, creatinine 1.17  Labs 11/16/17: Urine microalbumin/creatinine ratio 4.8; B12 282 (ref [phone]), folate 12.7 (ref >3.0)  Labs 11/01/17: HbA1c 6.9%, CBG 189 post-prandially; PTH 108, calcium 8.6, 25-OH vitamin D 64   Labs 09/13/17: Hepatic function panel normal; CBC normal  Labs 08/09/17: Hepatic function panel normal; CBC normal, except Hct 34.7 (35-45)  Labs 06/14/17: CBG 166  Labs 06/08/17: TSH 1.80, free T4 1.3, free T3 2.3; CMP normal except for glucose 115 and creatinine 1.27; PTH 120, calcium 9.7, 25-OH vitamin D 34;cholesterol 188, triglycerides 110, HDL 67, LDL 100; urine microalbumin/creatinine ratio   Labs 04/12/17: HbA1c 10.0%, CBG 174  Labs 03/04/17: Sodium 131, potassium 4.0, chloride 99, CO2 20, glucose 515, calcium 8.9  Labs 01/11/17: CBG 198  Labs 11/24/16: CBG 468; urine glucose 2000+, negative ketones  Labs 11/17/16: HbA1c 13.4%; TSH 1.54; cholesterol 173, triglycerides 91, HDL 71, LDL 83; 25-OH vitamin D 21; CMP normal except for glucose 314; CBC normal except MCHC 26.5 (ref 27-33)   06/04/16: HBA1c 8.1%, CBG 115  05/27/16: TSH 2.62, free T4 1.1, free T3 2.3; CMP normal except for glucose 161, and creatinine 1.09; cholesterol 187, triglycerides 77, HDL 74, LDL 98; urinary microalbumin/creatinine ratio 6; PTH 112, calcium 9.2, 25-OH vitamin D 33  03/17/16: CBG 145  Labs 12/31/15: HbA1c 13.4%, CBG  390; CMP normal except for glucose 344 and creatinine 1.14  Labs 10/08/15: HbA1c 9.6%  Labs 07/02/15: HbA1c 8.9%; PTH 107, calcium 9.2, 25-OH vitamin D 33; CBC normal; CMP normal except for creatinine 1.07; cholesterol 174, triglycerides 91, HDL 74, LDL 82; TSH 2.43, free T4 1.2, free T3 2.3  Labs 02/19/15: PTH 99, calcium 8.7, 25-OH vitamin D 20  Labs 10/08/14: HbA1c 8.0%   Labs 08/06/14: HbA1c 7.4%  Labs 08/01/14: TSH 2.014, free T4 0.87, free T3 2.4; PTH 79 (normal 14-64), calcium 9.1; 25-OH vitamin D 35 CMP normal  Labs 05/11/14: HbA1c was 7.7%, without any low BGs; TSH 2.416, free T4 1.13, free T3 2.3  Labs 02/22/14: HbA1c 11.6%  Labs 02/13/14: Calcium 9.0, PTH 131, 25-OH vitamin D 15; CMP normal except for glucose of 165; cholesterol 177, triglycerides 108, HDL 90, LDL 65; urinary microalbumin/creatinine ratio 15.6; TSH 3.639, free T4 1.30, free T3 2.5  Labs 10/10/13: HbA1c is 9.4%, compared with 8.1% at last visit and with 9.3% in December;   Labs 05/08/13: HbA1c 8.1%; PTH 90.2, calcium 9.5, 25-hydroxy vitamin D 46; CMP normal except for glucose of 215; WBC 8.0, RBC 5.20, Hgb 12.5, Hct 37.8%, MCV 72.7 (normal 78-100), iron 55  Labs 02/23/13: CMP normal, except glucose 194 and alkaline phosphatase 126; microalbumin/creatinine ratio was 11.5; TSH 2.086, free T4 1.29, free T3 3.2, TPO antibody < 10; 25-hydroxy vitamin D 18; C-peptide 1.79; cholesterol 156, triglycerides 64, HDL 61, LDL 82  Labs 01/05/13: Hemoglobin A1c was 9.3%, compared with 9.3 at last visit and with 9.1% at the visit prior. All of these values were major increases from 6.6% in May 2012.    Labs 04/14/12: 25-hydroxy vitamin D 21, iron 203,  Vitamin B12 379,   Labs 11/16/11: Hgb 10.3, Hct 32.4%; CMP normal except glucose 146' cholesterol 201, triglycerides 70, HDL 66, LDL 121; vitamin D 22           Labs 06/09/10: Cholesterol was 175, triglycerides 76, HDL 62, LDL 98. TSH was 1.217. Free T4 was 1.10. Free T3 was 2.7.  25-hydroxy vitamin D was 19. PTH was 167.  IMAGING:  BMD 09/08/19: T-score of forearm radius is -3.8, c/w osteoporosis.    Assessment and Plan:   ASSESSMENT:  1. Type 2 diabetes mellitus:   A. During the past four years her T2DM control has waxed and waned, with HbA1c values varying from 6.9-13.4%.    B. At today's visit she has lost another 7 pounds. However, since converting from [facility] to [place], her HbA1c has increased from 7.0% to 7.6%.   C. My goal is to reduce her HbA1c to <6.5% if we can safely do so.  D. If she will resume taking her metformin and continue her other medications and exercise, she can probably each that goal.  2. Hypoglycemia: She has not had any symptoms for some time.  3. Obesity: Her weight has decreased 7 pounds since her last visit. She needs to take her medications, continue to Eat Right and exercise daily.  4-5. Autonomic neuropathy and tachycardia:  A. The patient's autonomic neuropathy and  heart rate improved as her BGs decreased, but worsened as her BGs increased.  B. Although her HbA1c is higher, her heart rate is still within normal limits.  6. Hypertension: Her BP is normal today. She still needs to try to fit in more exercise and take her medications.  7-8. Anhedonia and anxiety:  1).  She is doing better, despite discontinuing Cymbalta. She may need to resume that medication if her anxiety worsens.  2). She still needs to call for help when she begins to do poorly.  3). She also needs to continue to find ways to have more joy in her life. 9-12. GERD/esophagitis/difficulty swallowing/gastroparesis: Her reflux, other GI symptoms, and difficulty swallowing have all improved.   13-15: Hypocalcemia, vitamin D deficiency, and secondary hyperparathyroidism:   A. Due to her bariatric surgery she has more difficulty absorbing vitamin D and calcium. When she develops hypocalcemia and vitamin D deficiency, her parathyroid glands appropriately produce more PTH,  resulting in secondary hyperparathyroidism. However, if she takes her calcium and vitamin D all three parameters will normalize.   B. Her calcium was normal in October 2018, but at the lower limit of normal in February 2019. Her vitamin D level was low-normal in May 2019.   C. In May 2019 her PTH was elevated to 120 and her calcium was 9.7. She still had secondary hyperparathyroidism. In October 2019 her calcium was at the low end of the reference range and her PTH was still elevated, but lower.  D. Her PTH level in August 2021 was mid-normal. Her calcium was at the low end of the reference range, because she was taking less calcium than she had intended. Her vitamin D level was good. She needed to increase her calcium intake to 600 mg, three times daily and take her vitamin D and calcium very consistently.   E. Since her last visit she stopped taking calcium and vitamin D again. She needs to resume taking the calcium and vitamin D. Two Tums per day will probably be adequate.  65. Noncompliance: Since last visit her compliance with eating right and taking her medications has generally been stable.   17: Muscle cramps: Her cramps occur more frequently now, presumably due to having less vitamin D and calcium on board. .  18. Hypothyroidism: Her TFTs in May 2019 and in December 2020 were normal. Her TSH in April 2022 was normal. Her current levothyroxine dosage is working well if she takes it. 19. Sleeping difficulties: These problems have improved.  20. Osteopenia/osteoporosis: Nihira's secondary hyperparathyroidism had normalized. She had a new BMD study in August 2021. Her osteoporosis was somewhat worse. She needs to take her calcium and vitamin D.   PLAN:  1. Diagnostic: I reviewed her lab results from September 2022 and January 2023. I ordered TFTs, CMP, PTH, 25-OH vitamin D, CBC, iron..    2. Therapeutic: Continue 25 mcg Synthroid at bedtime, Coreg , and Micardis.Resume 600 mg of calcium at  mealtimes, [employer], 50,000 IU per week, plus 2000 IU daily. Resume metformin, 500 mg, twice daily. Try to fit in exercise daily.  Eat meals that are relatively high in protein and relatively low in carbs. Eat Right Diet. Continue Ozempic, 1.5 mg, sq weekly. Check BGs 1-2 times er day.  3. Patient education: We discussed all of the above at great length. She must continue to take her medications and supplements. She also needs to see me every 2 months for now. When she knows that she is coming to see "[facility] Marveen Reeks", her compliance sometimes improves.  4. Follow-up:  late September   Level of Service: This visit lasted in excess of 55 minutes. More than 50% of the visit was devoted to counseling.   Tillman Sers, MD, [facility] 08/20/2021 11:30 AM

## 2021-08-28 ENCOUNTER — Encounter (INDEPENDENT_AMBULATORY_CARE_PROVIDER_SITE_OTHER): Payer: Self-pay

## 2021-08-28 ENCOUNTER — Other Ambulatory Visit (HOSPITAL_COMMUNITY): Payer: Self-pay

## 2021-09-03 ENCOUNTER — Other Ambulatory Visit (HOSPITAL_COMMUNITY): Payer: Self-pay

## 2021-09-03 ENCOUNTER — Encounter (INDEPENDENT_AMBULATORY_CARE_PROVIDER_SITE_OTHER): Payer: Self-pay

## 2021-09-04 DIAGNOSIS — F39 Unspecified mood [affective] disorder: Secondary | ICD-10-CM | POA: Diagnosis not present

## 2021-09-09 ENCOUNTER — Ambulatory Visit (INDEPENDENT_AMBULATORY_CARE_PROVIDER_SITE_OTHER): Payer: 59 | Admitting: Family Medicine

## 2021-09-09 ENCOUNTER — Other Ambulatory Visit (HOSPITAL_COMMUNITY): Payer: Self-pay

## 2021-09-09 ENCOUNTER — Encounter (INDEPENDENT_AMBULATORY_CARE_PROVIDER_SITE_OTHER): Payer: Self-pay | Admitting: Family Medicine

## 2021-09-09 VITALS — BP 138/87 | HR 83 | Temp 98.7°F | Ht 63.0 in | Wt 134.0 lb

## 2021-09-09 DIAGNOSIS — E669 Obesity, unspecified: Secondary | ICD-10-CM

## 2021-09-09 DIAGNOSIS — Z7985 Long-term (current) use of injectable non-insulin antidiabetic drugs: Secondary | ICD-10-CM | POA: Diagnosis not present

## 2021-09-09 DIAGNOSIS — M810 Age-related osteoporosis without current pathological fracture: Secondary | ICD-10-CM

## 2021-09-09 DIAGNOSIS — N1831 Chronic kidney disease, stage 3a: Secondary | ICD-10-CM | POA: Diagnosis not present

## 2021-09-09 DIAGNOSIS — E559 Vitamin D deficiency, unspecified: Secondary | ICD-10-CM | POA: Diagnosis not present

## 2021-09-09 DIAGNOSIS — Z9884 Bariatric surgery status: Secondary | ICD-10-CM | POA: Diagnosis not present

## 2021-09-09 DIAGNOSIS — Z6823 Body mass index (BMI) 23.0-23.9, adult: Secondary | ICD-10-CM

## 2021-09-09 DIAGNOSIS — Z6830 Body mass index (BMI) 30.0-30.9, adult: Secondary | ICD-10-CM

## 2021-09-09 DIAGNOSIS — E1122 Type 2 diabetes mellitus with diabetic chronic kidney disease: Secondary | ICD-10-CM | POA: Diagnosis not present

## 2021-09-09 DIAGNOSIS — Z7984 Long term (current) use of oral hypoglycemic drugs: Secondary | ICD-10-CM

## 2021-09-09 MED ORDER — SEMAGLUTIDE (2 MG/DOSE) 8 MG/3ML ~~LOC~~ SOPN
2.0000 mg | PEN_INJECTOR | SUBCUTANEOUS | 0 refills | Status: DC
  Filled 2021-09-09: qty 3, 28d supply, fill #0
  Filled 2021-10-23: qty 3, 28d supply, fill #1

## 2021-09-11 ENCOUNTER — Other Ambulatory Visit (HOSPITAL_COMMUNITY): Payer: Self-pay

## 2021-09-15 ENCOUNTER — Other Ambulatory Visit (HOSPITAL_COMMUNITY): Payer: Self-pay

## 2021-09-16 ENCOUNTER — Other Ambulatory Visit: Payer: Self-pay

## 2021-09-16 ENCOUNTER — Emergency Department (HOSPITAL_COMMUNITY)
Admission: EM | Admit: 2021-09-16 | Discharge: 2021-09-16 | Disposition: A | Discharge status: Home / Self Care | Payer: 59 | Attending: Emergency Medicine | Admitting: Emergency Medicine

## 2021-09-16 ENCOUNTER — Telehealth: Payer: Self-pay | Admitting: Adult Health

## 2021-09-16 ENCOUNTER — Emergency Department (HOSPITAL_COMMUNITY): Payer: 59

## 2021-09-16 ENCOUNTER — Telehealth (INDEPENDENT_AMBULATORY_CARE_PROVIDER_SITE_OTHER): Payer: Self-pay | Admitting: "Endocrinology

## 2021-09-16 ENCOUNTER — Encounter (HOSPITAL_COMMUNITY): Payer: Self-pay

## 2021-09-16 DIAGNOSIS — N189 Chronic kidney disease, unspecified: Secondary | ICD-10-CM | POA: Insufficient documentation

## 2021-09-16 DIAGNOSIS — Z79899 Other long term (current) drug therapy: Secondary | ICD-10-CM | POA: Insufficient documentation

## 2021-09-16 DIAGNOSIS — I129 Hypertensive chronic kidney disease with stage 1 through stage 4 chronic kidney disease, or unspecified chronic kidney disease: Secondary | ICD-10-CM | POA: Diagnosis not present

## 2021-09-16 DIAGNOSIS — Z7984 Long term (current) use of oral hypoglycemic drugs: Secondary | ICD-10-CM | POA: Diagnosis not present

## 2021-09-16 DIAGNOSIS — R110 Nausea: Secondary | ICD-10-CM | POA: Diagnosis not present

## 2021-09-16 DIAGNOSIS — R079 Chest pain, unspecified: Secondary | ICD-10-CM | POA: Insufficient documentation

## 2021-09-16 DIAGNOSIS — E1122 Type 2 diabetes mellitus with diabetic chronic kidney disease: Secondary | ICD-10-CM | POA: Insufficient documentation

## 2021-09-16 DIAGNOSIS — R0789 Other chest pain: Secondary | ICD-10-CM | POA: Diagnosis not present

## 2021-09-16 LAB — BASIC METABOLIC PANEL
Anion gap: 10 (ref 5–15)
BUN: 19 mg/dL (ref 6–20)
CO2: 19 mmol/L — ABNORMAL LOW (ref 22–32)
Calcium: 9 mg/dL (ref 8.9–10.3)
Chloride: 107 mmol/L (ref 98–111)
Creatinine, Ser: 1.4 mg/dL — ABNORMAL HIGH (ref 0.44–1.00)
GFR, Estimated: 43 mL/min — ABNORMAL LOW (ref >60)
Glucose, Bld: 135 mg/dL — ABNORMAL HIGH (ref 70–99)
Potassium: 4.9 mmol/L (ref 3.5–5.1)
Sodium: 136 mmol/L (ref 135–145)

## 2021-09-16 LAB — CBC
HCT: 36 % (ref 36.0–46.0)
Hemoglobin: 12 g/dL (ref 12.0–15.0)
MCH: 27 pg (ref 26.0–34.0)
MCHC: 33.3 g/dL (ref 30.0–36.0)
MCV: 80.9 fL (ref 80.0–100.0)
Platelets: 315 10*3/uL (ref 150–400)
RBC: 4.45 MIL/uL (ref 3.87–5.11)
RDW: 13.9 % (ref 11.5–15.5)
WBC: 5.2 10*3/uL (ref 4.0–10.5)
nRBC: 0 % (ref 0.0–0.2)

## 2021-09-16 LAB — TROPONIN I (HIGH SENSITIVITY): Troponin I (High Sensitivity): 3 ng/L (ref <18)

## 2021-09-16 NOTE — Discharge Instructions (Addendum)
Follow-up with your established cardiologist if your symptoms worsen for any reason

## 2021-09-16 NOTE — Telephone Encounter (Signed)
Pt LVM @ 3:45p.  She recently had an appt with Barnett Applebaum.  She said she isn't scheduled to see her again till Nov.  She started experiencing chest pain and anxiety.  Bu she has had her heart checked out (currently shows she is in the ER for chest pain).  Everything seems fine with her heart, so she thinks she needs some medicine for anxiety.  Next appt 11/1

## 2021-09-16 NOTE — ED Provider Notes (Signed)
[facility] EMERGENCY DEPARTMENT Provider Note   CSN: [id number] Arrival date & time: 09/16/21  1047     History  Chief Complaint  Patient presents with   Chest Pain    TORRI MICHALSKI is a 62 y.o. female.  Patient is a 61 year old female with past medical history of peripheral artery disease, diabetes, hypertension, chronic kidney disease, thyroid disease, and anxiety presenting for complaints of chest pain.  Patient admits to left sternal border chest pain that was sharp with radiation to the back times several days.  Patient states pain first began last week and was intermittent.  States pain became more severe over the past 2 days.  Patient denies any diaphoresis or nausea associated with chest pain.  Patient has had a history of 2 previous cardiac catheterizations.  Denies any fevers, chills, coughing.  Denies orthopnea, shortness of breath, or lower extremity edema.  Patient states yesterday she took a Ativan from her friend thought it might be anxiety and it helped bring her chest pain from a 10 to a 2.  Patient states she was previously on anxiety medications but stopped in February with the help of her psychiatrist.   Chest Pain Associated symptoms: no abdominal pain, no back pain, no cough, no fever, no palpitations, no shortness of breath and no vomiting        Home Medications Prior to Admission medications   Medication Sig Start Date End Date Taking? Authorizing Provider  albuterol (PROVENTIL) (2.5 MG/3ML) 0.083% nebulizer solution Take 3 mLs (2.5 mg total) by nebulization every 6 (six) hours as needed for wheezing or shortness of breath. 03/31/19   Elby Showers, MD  albuterol (VENTOLIN HFA) 108 (90 Base) MCG/ACT inhaler Inhale 2 puffs into the lungs every 6 (six) hours as needed for wheezing or shortness of breath. 03/31/19   Elby Showers, MD  albuterol (VENTOLIN HFA) 108 (90 Base) MCG/ACT inhaler Inhale 2 puffs into the lungs every 6 (six) hours as  needed for wheezing or shortness of breath. 03/25/21   Apolonio Schneiders, FNP  benzonatate (TESSALON) 100 MG capsule Take 1 capsule (100 mg total) by mouth 3 (three) times daily as needed for cough. 03/27/21   Elby Showers, MD  betamethasone valerate ointment (VALISONE) 0.1 % Apply 1 application topically 2 (two) times daily. 07/24/20   Elby Showers, MD  Blood Glucose Monitoring Suppl (FREESTYLE LITE) DEVI Use to check glucose 3x daily 01/03/18   Sherrlyn Hock, MD  calcium-vitamin D (OSCAL WITH D) 500-200 MG-UNIT TABS tablet Take by mouth.    [provider]  carvedilol (COREG) 3.125 MG tablet Take 1 tablet (3.125 mg total) by mouth 2 (two) times daily with a meal. 03/11/21   Sherrlyn Hock, MD  Cholecalciferol 50 MCG (2000 UT) CAPS Take 1 capsule by mouth daily.    [provider]  conjugated estrogens (PREMARIN) vaginal cream Place 1 applicatorful vaginally at bedtime for 21 days then off for 7 days. 05/24/20   Elby Showers, MD  Cyanocobalamin (VITAMIN B 12) 100 MCG LOZG Take by mouth.    [provider]  cyclobenzaprine (FLEXERIL) 10 MG tablet Take 1 tablet (10 mg total) by mouth 3 (three) times daily as needed for muscle spasms. 10/01/20   Elby Showers, MD  doxycycline (VIBRA-TABS) 100 MG tablet Take 1 tablet (100 mg total) by mouth 2 (two) times daily. 04/04/21   Elby Showers, MD  DULoxetine (CYMBALTA) 60 MG capsule Take 1  capsule (60 mg total) by mouth daily. 02/05/21   Mozingo, Berdie Ogren, NP  Efinaconazole 10 % SOLN Apply 1 drop topically daily. 06/19/21   Trula Slade, DPM  Ferrous Sulfate (SLOW FE PO) Take 1 tablet by mouth daily.     [provider]  glucose blood (FREESTYLE LITE) test strip Use 1 to check blood glucose 3 times daily. 09/19/20   Whitmire, Joneen Boers, FNP  HYDROcodone bit-homatropine (HYCODAN) 5-1.5 MG/5ML syrup Take 5 mLs by mouth every 8 (eight) hours as needed for cough. 04/04/21   Elby Showers, MD  ibuprofen (ADVIL,MOTRIN)  200 MG tablet Take 600 mg by mouth daily as needed for headache or moderate pain.    [provider]  levothyroxine (SYNTHROID) 25 MCG tablet TAKE 1 TABLET BY MOUTH ONCE DAILY 12/11/19 06/10/21  Sherrlyn Hock, MD  metFORMIN (GLUCOPHAGE) 500 MG tablet Take 1 tablet (500 mg total) by mouth 2 (two) times daily with a meal. 08/20/21   Sherrlyn Hock, MD  Multiple Vitamin (MULTIVITAMIN) tablet Take 1 tablet by mouth daily. Reported on 02/26/2015 03/06/17   Cristal Ford, DO  ondansetron (ZOFRAN) 4 MG tablet Take 1 tablet (4 mg total) by mouth every 8 (eight) hours as needed for nausea or vomiting. 03/11/17   Elby Showers, MD  pantoprazole (PROTONIX) 40 MG tablet TAKE 1 TABLET (40 MG TOTAL) BY MOUTH DAILY. 12/11/19 12/10/20  Sherrlyn Hock, MD  Semaglutide, 2 MG/DOSE, 8 MG/3ML SOPN Inject 2 mg as directed once a week. 09/09/21   Bowen, Collene Leyden, DO  telmisartan (MICARDIS) 40 MG tablet TAKE 1/2 TABLET BY MOUTH TWO TIMES DAILY 12/11/19 06/10/21  Sherrlyn Hock, MD  traZODone (DESYREL) 50 MG tablet Take 1-2 tablets (50-100 mg total) by mouth at bedtime as needed for sleep 05/28/21   Mozingo, Berdie Ogren, NP  Vitamin D, Ergocalciferol, (DRISDOL) 50000 units CAPS capsule Take 50,000 Units by mouth every Sunday.    [provider]      Allergies    Theophyllines    Review of Systems   Review of Systems  Constitutional:  Negative for chills and fever.  HENT:  Negative for ear pain and sore throat.   Eyes:  Negative for pain and visual disturbance.  Respiratory:  Negative for cough and shortness of breath.   Cardiovascular:  Positive for chest pain. Negative for palpitations.  Gastrointestinal:  Negative for abdominal pain and vomiting.  Genitourinary:  Negative for dysuria and hematuria.  Musculoskeletal:  Negative for arthralgias and back pain.  Skin:  Negative for color change and rash.  Neurological:  Negative for seizures and syncope.  All other systems reviewed and  are negative.   Physical Exam Updated Vital Signs BP (!) 151/94 (BP Location: Right Arm)   Pulse 72   Temp (!) 97.4 F (36.3 C) (Oral)   Resp 17   Ht '5\' 3"'$  (1.6 m)   Wt 61.2 kg   LMP 05/11/2010   SpO2 100%   BMI 23.91 kg/m  Physical Exam Vitals and nursing note reviewed.  Constitutional:      General: She is not in acute distress.    Appearance: She is well-developed.  HENT:     Head: Normocephalic and atraumatic.  Eyes:     Conjunctiva/sclera: Conjunctivae normal.  Cardiovascular:     Rate and Rhythm: Normal rate and regular rhythm.     Heart sounds: No murmur heard. Pulmonary:     Effort: Pulmonary effort is normal. No respiratory  distress.     Breath sounds: Normal breath sounds.  Abdominal:     Palpations: Abdomen is soft.     Tenderness: There is no abdominal tenderness.  Musculoskeletal:        General: No swelling.     Cervical back: Neck supple.  Skin:    General: Skin is warm and dry.     Capillary Refill: Capillary refill takes less than 2 seconds.     Findings: No rash.  Neurological:     Mental Status: She is alert and oriented to person, place, and time.     GCS: GCS eye subscore is 4. GCS verbal subscore is 5. GCS motor subscore is 6.  Psychiatric:        Mood and Affect: Mood normal.     ED Results / Procedures / Treatments   Labs (all labs ordered are listed, but only abnormal results are displayed) Labs Reviewed  BASIC METABOLIC PANEL - Abnormal; Notable for the following components:      Result Value   CO2 19 (*)    Glucose, Bld 135 (*)    Creatinine, Ser 1.40 (*)    GFR, Estimated 43 (*)    All other components within normal limits  CBC  TROPONIN I (HIGH SENSITIVITY)  TROPONIN I (HIGH SENSITIVITY)    EKG EKG Interpretation  Date/Time:  Tuesday September 16 2021 11:18:51 EDT Ventricular Rate:  80 PR Interval:  116 QRS Duration: 68 QT Interval:  364 QTC Calculation: 419 R Axis:   0 Text Interpretation: Normal sinus rhythm Normal  ECG When compared with ECG of 19-Mar-2017 10:53, PREVIOUS ECG IS PRESENT Confirmed by Campbell Stall ([id number]) on 0/62/3762 3:14:42 PM  Radiology DG Chest 2 View  Result Date: 09/16/2021 CLINICAL DATA:  Chest pain radiating to the left arm.  Nausea. EXAM: CHEST - 2 VIEW COMPARISON:  03/04/2017 and CT chest 09/08/2001. FINDINGS: Trachea is midline. Heart size normal. Lungs are clear. No pleural fluid. IMPRESSION: Negative. Electronically Signed   By: Lorin Picket M.D.   On: 09/16/2021 13:27    Procedures Procedures    Medications Ordered in ED Medications - No data to display  ED Course/ Medical Decision Making/ A&P                           Medical Decision Making  69:44 PM 61 year old female with past medical history of peripheral artery disease, diabetes, hypertension, chronic kidney disease, thyroid disease, and anxiety presenting for complaints of chest pain.   The patient's chest pain is not suggestive of pulmonary embolus, cardiac ischemia, aortic dissection, pericarditis, myocarditis, pulmonary embolism, pneumothorax, pneumonia, Zoster, or esophageal perforation, or other serious etiology.  Historically not abrupt in onset, tearing or ripping, pulses symmetric. EKG nonspecific for ischemia/infarction. No dysrhythmias, brugada, WPW, prolonged QT noted. [CXR reviewed and WNL. Troponin negative.  Patient's chest pain has been ongoing for the past week increased severity over the past 2 days.  No second troponin indicated at this time because of outside 3-hour time window for troponin elevation. CXR reviewed. Labs without demonstration of acute pathology unless otherwise noted above.   PE considered however thought to be less likely secondary to the fact that patient has no shortness of breath and no pleuritic chest pain.  HEART Score 5.  Although patient's chest pain is minimal and resolving at this time I highly recommend that she follows closely with her cardiologist for reevaluation of  pain reoccurs.  Patient  states she is already established an appointment with her psychiatrist for reevaluation for anxiety.  Patient in no distress and overall condition improved here in the ED. Detailed discussions were had with the patient regarding current findings, and need for close f/u with PCP or on call doctor. The patient has been instructed to return immediately if the symptoms worsen in any way for re-evaluation. Patient verbalized understanding and is in agreement with current care plan. All questions answered prior to discharge.         Final Clinical Impression(s) / ED Diagnoses Final diagnoses:  Chest pain, unspecified type    Rx / DC Orders ED Discharge Orders     None         Lianne Cure, DO 06/19/89 1545

## 2021-09-16 NOTE — Telephone Encounter (Signed)
Alexa [name] called me at 10:35 AM.  Subjective: She has been under a lot of stress and has been having more anxiety. Unfortunately, she began to have mid-chest pains several days ago that have been intermittent, but worse today. Her BP has also been elevated. She called EMS today. ECG done at her home was normal. She wanted advice about what to do next. I told her that given her previous cardiac history, it is prudent for her to go to the ED and be checked out thoroughly. She will also need follow up with her cardiologist. She agrees and is on the way to the ED now. Tillman Sers, MD, [facility]

## 2021-09-16 NOTE — Telephone Encounter (Signed)
Let's get her in for an appointment.

## 2021-09-16 NOTE — ED Provider Triage Note (Signed)
Emergency Medicine Provider Triage Evaluation Note  Alexa [name] , a 61 y.o. female  was evaluated in triage.  Pt complains of chest pain.  Is been going on intermittently for about a week, denies any exertional pleuritic component.  Feels like it aching pain that radiates to her left arm, associate with nausea but no vomiting or shortness of breath.  Patient is not anticoagulated, history of 2 previous cardiac catheterizations.  Review of Systems  Per HPI  Physical Exam  BP (!) 147/91 (BP Location: Right Arm)   Pulse 87   Temp 98.3 F (36.8 C) (Oral)   Resp 16   Ht '5\' 3"'$  (1.6 m)   Wt 61.2 kg   LMP 05/11/2010   SpO2 99%   BMI 23.91 kg/m  Gen:   Awake, no distress   Resp:  Normal effort  MSK:   Moves extremities without difficulty  Other:    Medical Decision Making  Medically screening exam initiated at 11:51 AM.  Appropriate orders placed.  Alexa [name] was informed that the remainder of the evaluation will be completed by another provider, this initial triage assessment does not replace that evaluation, and the importance of remaining in the ED until their evaluation is complete.     Alexa Raring, PA-C 09/16/21 1152

## 2021-09-16 NOTE — ED Triage Notes (Signed)
Pt arrived POV from home c/o centralized CP that radiates to her left arm x1 week. Pt endorses Nausea.

## 2021-09-16 NOTE — Telephone Encounter (Signed)
Pt just left ER and heart is fine.She has increased anxiety due to things happening in her life and would like to go back on an anti anxiety med.

## 2021-09-17 NOTE — Telephone Encounter (Signed)
Please schedule pt a sooner appt then 11/1 so that she can discuss anxiety med

## 2021-09-17 NOTE — Progress Notes (Signed)
Chief Complaint:   OBESITY Alexa [name] is here to discuss her progress with her obesity treatment plan along with follow-up of her obesity related diagnoses. Alexa [name] is on practicing portion control and making smarter food choices, such as increasing vegetables and decreasing simple carbohydrates with 80 grams of protein daily and states she is following her eating plan approximately 75% of the time. Alexa [name] states she is walking for 30-45 minutes 3 times per week.  Today's visit was #: 75 Starting weight: 169 lbs Starting date: 11/16/2017 Today's weight: 134 lbs Today's date: 09/09/2021 Total lbs lost to date: 35 Total lbs lost since last in-office visit: 3  Interim History: Alexa [name] was working on her weight goal of 135 pounds and she is happy to hit her goal.  She is doing well on Ozempic 1.5 mg subcu weekly injection for both obesity and type 2 diabetes mellitus.  Labs ordered by endocrinology, to be done soon.  She is walking 3 times a week and eating healthy.  Subjective:   1. S/P gastric bypass Alexa [name] is status post gastric bypass done in 2008 at [facility] surgery.  She has adequate restriction without nausea, vomiting, or diarrhea.  Complications: Osteoporosis.  2. Type 2 diabetes mellitus with stage 3a chronic kidney disease, without long-term current use of insulin (HCC) Alexa [name] has seen Dr. Tobe Sos, and her last A1c was 7.6 on 08/20/2021.  She is on Ozempic 2 mg subcu weekly injection and recently restarted metformin 500 mg twice daily.  3. Vitamin D deficiency Alexa [name] is on prescription vitamin D 50,000 units once weekly per Endocrinology.  She denies fatigue.  4. Osteoporosis, unspecified osteoporosis type, unspecified pathological fracture presence Alexa [name]'s T score forearm was -3.8 in August 2021, reviewed with the patient.  She is not on treatment, and she is at high risk for fractures.  Assessment/Plan:   1. S/P gastric bypass Tamaria is to take a multivitamin daily,  calcium citrate, and vitamin D weekly.  2. Type 2 diabetes mellitus with stage 3a chronic kidney disease, without long-term current use of insulin (HCC) Alexa [name] will continue her low sugar diet with lean protein and fiber with meals.  She will follow-up with Endocrinology for labs.  3. Vitamin D deficiency Alexa [name] will continue prescription vitamin D weekly, and labs were ordered per Endocrinology.  4. Osteoporosis, unspecified osteoporosis type, unspecified pathological fracture presence Alexa [name] is to take calcium citrate daily and continue her vitamin D prescription weekly.  She will follow-up with Endocrinology.  5. Obesity, current BMI 23.8 Alexa [name] is currently in the action stage of change. As such, her goal is to continue with weight loss efforts. She has agreed to practicing portion control and making smarter food choices, such as increasing vegetables and decreasing simple carbohydrates with 80 grams of protein daily.   We discussed various medication options to help Alexa [name] with her weight loss efforts and we both agreed to continue Ozempic 2 mg once weekly injections, and we will refill for 1 month.  - Semaglutide, 2 MG/DOSE, 8 MG/3ML SOPN; Inject 2 mg as directed once a week.  Dispense: 9 mL; Refill: 0  Exercise goals: Add resistance training 3 times a week.   Behavioral modification strategies: increasing lean protein intake, increasing water intake, decreasing eating out, no skipping meals, and avoiding temptations.  Alexa [name] has agreed to follow-up with our clinic in 8 weeks. She was informed of the importance of frequent follow-up visits to maximize her success with intensive lifestyle modifications for her multiple health conditions.  Objective:   Blood pressure 138/87, pulse 83, temperature 98.7 F (37.1 C), height '5\' 3"'$  (1.6 m), weight 134 lb (60.8 kg), last menstrual period 05/11/2010, SpO2 100 %. Body mass index is 23.74 kg/m.  General: Cooperative, alert, well developed,  in no acute distress. HEENT: Conjunctivae and lids unremarkable. Cardiovascular: Regular rhythm.  Lungs: Normal work of breathing. Neurologic: No focal deficits.   Lab Results  Component Value Date   CREATININE 1.40 (H) 09/16/2021   BUN 19 09/16/2021   NA 136 09/16/2021   K 4.9 09/16/2021   CL 107 09/16/2021   CO2 19 (L) 09/16/2021   Lab Results  Component Value Date   ALT 29 02/05/2021   AST 25 02/05/2021   ALKPHOS 85 05/27/2016   BILITOT 0.5 02/05/2021   Lab Results  Component Value Date   HGBA1C 7.6 (A) 08/20/2021   HGBA1C 7.0 (A) 02/05/2021   HGBA1C 7.6 (A) 10/01/2020   HGBA1C 7.5 (H) 05/16/2020   HGBA1C 8.5 (A) 12/11/2019   Lab Results  Component Value Date   INSULIN 8.1 11/16/2017   Lab Results  Component Value Date   TSH 2.29 02/05/2021   Lab Results  Component Value Date   CHOL 210 (H) 05/16/2020   HDL 65 05/16/2020   LDLCALC 127 (H) 05/16/2020   TRIG 84 05/16/2020   CHOLHDL 3.2 05/16/2020   Lab Results  Component Value Date   VD25OH 45 02/05/2021   VD25OH 71 03/27/2019   VD25OH 64 11/01/2017   Lab Results  Component Value Date   WBC 5.2 09/16/2021   HGB 12.0 09/16/2021   HCT 36.0 09/16/2021   MCV 80.9 09/16/2021   PLT 315 09/16/2021   Lab Results  Component Value Date   IRON 73 02/05/2021   TIBC 357 04/14/2012   Attestation Statements:   Reviewed by clinician on day of visit: allergies, medications, problem list, medical history, surgical history, family history, social history, and previous encounter notes.   Wilhemena Durie, am acting as transcriptionist for Loyal Gambler, DO.  I have reviewed the above documentation for accuracy and completeness, and I agree with the above. Dell Ponto, DO

## 2021-09-18 ENCOUNTER — Other Ambulatory Visit (HOSPITAL_COMMUNITY): Payer: Self-pay

## 2021-09-18 ENCOUNTER — Encounter: Payer: Self-pay | Admitting: Adult Health

## 2021-09-18 ENCOUNTER — Ambulatory Visit (INDEPENDENT_AMBULATORY_CARE_PROVIDER_SITE_OTHER): Payer: 59 | Admitting: Adult Health

## 2021-09-18 DIAGNOSIS — G4700 Insomnia, unspecified: Secondary | ICD-10-CM

## 2021-09-18 DIAGNOSIS — F331 Major depressive disorder, recurrent, moderate: Secondary | ICD-10-CM | POA: Diagnosis not present

## 2021-09-18 DIAGNOSIS — F411 Generalized anxiety disorder: Secondary | ICD-10-CM | POA: Diagnosis not present

## 2021-09-18 DIAGNOSIS — F4310 Post-traumatic stress disorder, unspecified: Secondary | ICD-10-CM | POA: Diagnosis not present

## 2021-09-18 MED ORDER — ALPRAZOLAM 1 MG PO TABS
1.0000 mg | ORAL_TABLET | Freq: Every evening | ORAL | 2 refills | Status: DC | PRN
  Filled 2021-09-18: qty 30, 30d supply, fill #0
  Filled 2021-10-23: qty 30, 30d supply, fill #1
  Filled 2021-11-17 – 2021-11-24 (×2): qty 30, 30d supply, fill #2

## 2021-09-18 NOTE — Progress Notes (Signed)
Alexa [name] [id number] 02-21-1960 60 y.o.  Subjective:   Patient ID:  Alexa [name] is a 61 y.o. (DOB [birth year]) female.  Chief Complaint: No chief complaint on file.   HPI Alexa [name] presents to the office today for follow-up of  MDD, GAD, insomnia and PTSD.   Referred by therapist - [employer]  Describes mood today as "ok". Pleasant. Denies tearfulness. Mood symptoms - denies depression. Feels anxious at times - "all in my chest". Reports some worry and rumination - situation with 2 ladies at church. Denies irritability. Mood is consistent. Stating "I don't feel quite as good as I did". Willing to consider other options. Seeing therapist [employer]. Stable interest and motivation. Taking medications as prescribed.  Energy levels stable. Active, walking more. Enjoys some usual interests and activities. Married x 21 years. Lives with husband and 3 cats. Spending time with family and friends Appetite adequate. Weight stable - 138 pounds. Sleeping difficulties. Averages 6 hours of interrupted.  Focus and concentration stable. Completing tasks. Managing aspects of household. Has worked as a Marine scientist for 36 years. Denies SI or HI.  Denies AH or VH. Denies self harm. Denies substance use.   Multiple health issues.  Previous medication trials:  Lexapro, Ambien,    PHQ2-9    Flowsheet Row Office Visit from 11/28/2020 in [facility] Office Visit from 05/16/2020 in [facility] Office Visit from 03/31/2019 in [facility], [facility] Office Visit from 12/13/2017 in [facility], MD Office Visit from 11/16/2017 in [facility]  PHQ-2 Total Score 1 4 0 2 5  PHQ-9 Total Score 7 5 -- 6 [facility] from 09/16/2021 in [facility] No Risk        Review of Systems:  Review of Systems  Musculoskeletal:  Negative for gait problem.  Neurological:  Negative for tremors.   Psychiatric/Behavioral:         Please refer to HPI    Medications: I have reviewed the patient's current medications.  Current Outpatient Medications  Medication Sig Dispense Refill   ALPRAZolam (XANAX) 1 MG tablet Take 1 tablet (1 mg total) by mouth at bedtime as needed for anxiety. 30 tablet 2   albuterol (PROVENTIL) (2.5 MG/3ML) 0.083% nebulizer solution Take 3 mLs (2.5 mg total) by nebulization every 6 (six) hours as needed for wheezing or shortness of breath. 150 mL 1   albuterol (VENTOLIN HFA) 108 (90 Base) MCG/ACT inhaler Inhale 2 puffs into the lungs every 6 (six) hours as needed for wheezing or shortness of breath. 6.7 g 11   albuterol (VENTOLIN HFA) 108 (90 Base) MCG/ACT inhaler Inhale 2 puffs into the lungs every 6 (six) hours as needed for wheezing or shortness of breath. 8 g 0   benzonatate (TESSALON) 100 MG capsule Take 1 capsule (100 mg total) by mouth 3 (three) times daily as needed for cough. 30 capsule 0   betamethasone valerate ointment (VALISONE) 0.1 % Apply 1 application topically 2 (two) times daily. 30 g 1   Blood Glucose Monitoring Suppl (FREESTYLE LITE) DEVI Use to check glucose 3x daily 2 each 5   calcium-vitamin D (OSCAL WITH D) 500-200 MG-UNIT TABS tablet Take by mouth.     carvedilol (COREG) 3.125 MG tablet Take 1 tablet (3.125 mg total) by mouth 2 (two) times daily with a meal. 120 tablet 10   Cholecalciferol 50 MCG (  2000 UT) CAPS Take 1 capsule by mouth daily.     conjugated estrogens (PREMARIN) vaginal cream Place 1 applicatorful vaginally at bedtime for 21 days then off for 7 days. 30 g 11   Cyanocobalamin (VITAMIN B 12) 100 MCG LOZG Take by mouth.     cyclobenzaprine (FLEXERIL) 10 MG tablet Take 1 tablet (10 mg total) by mouth 3 (three) times daily as needed for muscle spasms. 90 tablet 0   doxycycline (VIBRA-TABS) 100 MG tablet Take 1 tablet (100 mg total) by mouth 2 (two) times daily. 20 tablet 0   DULoxetine (CYMBALTA) 60 MG capsule Take 1 capsule (60 mg  total) by mouth daily. 90 capsule 3   Efinaconazole 10 % SOLN Apply 1 drop topically daily. 4 mL 11   Ferrous Sulfate (SLOW FE PO) Take 1 tablet by mouth daily.      glucose blood (FREESTYLE LITE) test strip Use 1 to check blood glucose 3 times daily. 300 each 1   HYDROcodone bit-homatropine (HYCODAN) 5-1.5 MG/5ML syrup Take 5 mLs by mouth every 8 (eight) hours as needed for cough. 120 mL 0   ibuprofen (ADVIL,MOTRIN) 200 MG tablet Take 600 mg by mouth daily as needed for headache or moderate pain.     levothyroxine (SYNTHROID) 25 MCG tablet TAKE 1 TABLET BY MOUTH ONCE DAILY 90 tablet 1   metFORMIN (GLUCOPHAGE) 500 MG tablet Take 1 tablet (500 mg total) by mouth 2 (two) times daily with a meal. 60 tablet 5   Multiple Vitamin (MULTIVITAMIN) tablet Take 1 tablet by mouth daily. Reported on 02/26/2015 30 tablet 0   ondansetron (ZOFRAN) 4 MG tablet Take 1 tablet (4 mg total) by mouth every 8 (eight) hours as needed for nausea or vomiting. 20 tablet 0   pantoprazole (PROTONIX) 40 MG tablet TAKE 1 TABLET (40 MG TOTAL) BY MOUTH DAILY. 90 tablet 4   Semaglutide, 2 MG/DOSE, 8 MG/3ML SOPN Inject 2 mg as directed once a week. 9 mL 0   telmisartan (MICARDIS) 40 MG tablet TAKE 1/2 TABLET BY MOUTH TWO TIMES DAILY 90 tablet 3   traZODone (DESYREL) 50 MG tablet Take 1-2 tablets (50-100 mg total) by mouth at bedtime as needed for sleep 180 tablet 3   Vitamin D, Ergocalciferol, (DRISDOL) 50000 units CAPS capsule Take 50,000 Units by mouth every Sunday.     No current facility-administered medications for this visit.    Medication Side Effects: None  Allergies:  Allergies  Allergen Reactions   Theophyllines Palpitations    Past Medical History:  Diagnosis Date   Allergy    Anemia, iron deficiency    Anhedonia    Anxiety    Asthma    Asthma, chronic    Back pain    CAD (coronary artery disease)    Cervical spondylosis 06/13/2020   Chewing difficulty    Chronic insomnia    Chronic kidney disease     CKD stage 3 A    Combined hyperlipidemia    Constipation    Depression    Diabetes mellitus    Diabetes mellitus type II    Diabetic autonomic neuropathy (HCC)    Difficulty swallowing pills    DM type 2 with diabetic peripheral neuropathy (HCC)    Dry mouth    Dyspepsia    Dyspnea    Elevated homocysteine    Essential tremor    Fatigue    Fatty liver    GERD (gastroesophageal reflux disease)    GERD (gastroesophageal reflux disease)  Glossitis    Goiter    Hemorrhoids    HLD (hyperlipidemia)    Hyperparathyroidism , secondary, non-renal ([place])    Hypertension    Hypocalcemia    Insomnia    Leg edema    Leg pain    Macular degeneration    Nonproliferative diabetic retinopathy associated with type 2 diabetes mellitus (HCC)    RESOLVED   Obesity, Class III, BMI 40-49.9 (morbid obesity) (HCC)    Osteopenia    Osteoporosis    Pallor    PONV (postoperative nausea and vomiting)    Status post gastric surgery    Tachycardia    Thyroiditis, autoimmune    Vaginal dryness, menopausal    Vitamin D deficiency     Past Medical History, Surgical history, Social history, and Family history were reviewed and updated as appropriate.   Please see review of systems for further details on the patient's review from today.   Objective:   Physical Exam:  LMP 05/11/2010   Physical Exam Constitutional:      General: She is not in acute distress. Musculoskeletal:        General: No deformity.  Neurological:     Mental Status: She is alert and oriented to person, place, and time.     Coordination: Coordination normal.  Psychiatric:        Attention and Perception: Attention and perception normal. She does not perceive auditory or visual hallucinations.        Mood and Affect: Mood normal. Mood is not anxious or depressed. Affect is not labile, blunt, angry or inappropriate.        Speech: Speech normal.        Behavior: Behavior normal.        Thought Content: Thought content  normal. Thought content is not paranoid or delusional. Thought content does not include homicidal or suicidal ideation. Thought content does not include homicidal or suicidal plan.        Cognition and Memory: Cognition and memory normal.        Judgment: Judgment normal.     Comments: Insight intact     Lab Review:     Component Value Date/Time   NA 136 09/16/2021 1153   K 4.9 09/16/2021 1153   CL 107 09/16/2021 1153   CO2 19 (L) 09/16/2021 1153   GLUCOSE 135 (H) 09/16/2021 1153   BUN 19 09/16/2021 1153   CREATININE 1.40 (H) 09/16/2021 1153   CREATININE 1.04 (H) 02/05/2021 1118   CALCIUM 9.0 09/16/2021 1153   CALCIUM 9.3 08/14/2011 1217   PROT 6.3 02/05/2021 1118   ALBUMIN 4.1 05/27/2016 0828   AST 25 02/05/2021 1118   ALT 29 02/05/2021 1118   ALKPHOS 85 05/27/2016 0828   BILITOT 0.5 02/05/2021 1118   GFRNONAA 43 (L) 09/16/2021 1153   GFRNONAA 59 (L) 05/16/2020 1050   GFRAA 69 05/16/2020 1050       Component Value Date/Time   WBC 5.2 09/16/2021 1153   RBC 4.45 09/16/2021 1153   HGB 12.0 09/16/2021 1153   HCT 36.0 09/16/2021 1153   PLT 315 09/16/2021 1153   MCV 80.9 09/16/2021 1153   MCH 27.0 09/16/2021 1153   MCHC 33.3 09/16/2021 1153   RDW 13.9 09/16/2021 1153   LYMPHSABS 2,001 02/05/2021 1118   MONOABS 0.8 10/19/2015 1327   EOSABS 81 02/05/2021 1118   BASOSABS 29 02/05/2021 1118    No results found for: "POCLITH", "LITHIUM"   No results found for: "PHENYTOIN", "PHENOBARB", "VALPROATE", "  CBMZ"   .res Assessment: Plan:    Plan:  PDMP reviewed  Cymbalta '60mg'$  daily Increase Trazadone '50mg'$  at hs to 1 to 2 tablets at bedtime.  RTC 6 months  Patient advised to contact office with any questions, adverse effects, or acute worsening in signs and symptoms.   Time spent with patient was 25 minutes. Greater than 50% of face to face time with patient was spent on counseling and coordination of care.   Diagnoses and all orders for this visit:  Generalized  anxiety disorder -     ALPRAZolam (XANAX) 1 MG tablet; Take 1 tablet (1 mg total) by mouth at bedtime as needed for anxiety.  Insomnia, unspecified type  PTSD (post-traumatic stress disorder)  Major depressive disorder, recurrent episode, moderate ([place])     Please see After Visit Summary for patient specific instructions.  Future Appointments  Date Time Provider [place]  10/22/2021 11:00 AM Sherrlyn Hock, MD [facility]  11/10/2021  2:40 PM Bowen, Collene Leyden, DO [facility] None  11/13/2021 10:00 AM Baxley, Cresenciano Lick, MD [facility]  11/26/2021  1:00 PM Tena Linebaugh, Berdie Ogren, NP [facility] None    No orders of the defined types were placed in this encounter.   -------------------------------

## 2021-09-23 ENCOUNTER — Other Ambulatory Visit (HOSPITAL_COMMUNITY): Payer: Self-pay

## 2021-10-02 DIAGNOSIS — F39 Unspecified mood [affective] disorder: Secondary | ICD-10-CM | POA: Diagnosis not present

## 2021-10-14 DIAGNOSIS — E211 Secondary hyperparathyroidism, not elsewhere classified: Secondary | ICD-10-CM | POA: Diagnosis not present

## 2021-10-14 DIAGNOSIS — I10 Essential (primary) hypertension: Secondary | ICD-10-CM | POA: Diagnosis not present

## 2021-10-14 DIAGNOSIS — E063 Autoimmune thyroiditis: Secondary | ICD-10-CM | POA: Diagnosis not present

## 2021-10-14 DIAGNOSIS — E1140 Type 2 diabetes mellitus with diabetic neuropathy, unspecified: Secondary | ICD-10-CM | POA: Diagnosis not present

## 2021-10-14 DIAGNOSIS — E559 Vitamin D deficiency, unspecified: Secondary | ICD-10-CM | POA: Diagnosis not present

## 2021-10-15 LAB — T3, FREE: T3, Free: 3 pg/mL (ref 2.3–4.2)

## 2021-10-15 LAB — COMPREHENSIVE METABOLIC PANEL
AG Ratio: 2.2 (calc) (ref 1.0–2.5)
ALT: 13 U/L (ref 6–29)
AST: 14 U/L (ref 10–35)
Albumin: 4.4 g/dL (ref 3.6–5.1)
Alkaline phosphatase (APISO): 96 U/L (ref 37–153)
BUN/Creatinine Ratio: 14 (calc) (ref 6–22)
BUN: 16 mg/dL (ref 7–25)
CO2: 22 mmol/L (ref 20–32)
Calcium: 9 mg/dL (ref 8.6–10.4)
Chloride: 99 mmol/L (ref 98–110)
Creat: 1.17 mg/dL — ABNORMAL HIGH (ref 0.50–1.05)
Globulin: 2 g/dL (calc) (ref 1.9–3.7)
Glucose, Bld: 98 mg/dL (ref 65–139)
Potassium: 4.2 mmol/L (ref 3.5–5.3)
Sodium: 133 mmol/L — ABNORMAL LOW (ref 135–146)
Total Bilirubin: 0.5 mg/dL (ref 0.2–1.2)
Total Protein: 6.4 g/dL (ref 6.1–8.1)

## 2021-10-15 LAB — PTH, INTACT AND CALCIUM
Calcium: 9 mg/dL (ref 8.6–10.4)
PTH: 160 pg/mL — ABNORMAL HIGH (ref 16–77)

## 2021-10-15 LAB — CBC WITH DIFFERENTIAL/PLATELET
Absolute Monocytes: 393 cells/uL (ref 200–950)
Basophils Absolute: 40 cells/uL (ref 0–200)
Basophils Relative: 0.7 %
Eosinophils Absolute: 63 cells/uL (ref 15–500)
Eosinophils Relative: 1.1 %
HCT: 34.3 % — ABNORMAL LOW (ref 35.0–45.0)
Hemoglobin: 11.4 g/dL — ABNORMAL LOW (ref 11.7–15.5)
Lymphs Abs: 2457 cells/uL (ref 850–3900)
MCH: 26.6 pg — ABNORMAL LOW (ref 27.0–33.0)
MCHC: 33.2 g/dL (ref 32.0–36.0)
MCV: 80 fL (ref 80.0–100.0)
MPV: 9.9 fL (ref 7.5–12.5)
Monocytes Relative: 6.9 %
Neutro Abs: 2747 cells/uL (ref 1500–7800)
Neutrophils Relative %: 48.2 %
Platelets: 340 10*3/uL (ref 140–400)
RBC: 4.29 10*6/uL (ref 3.80–5.10)
RDW: 14.4 % (ref 11.0–15.0)
Total Lymphocyte: 43.1 %
WBC: 5.7 10*3/uL (ref 3.8–10.8)

## 2021-10-15 LAB — VITAMIN D 25 HYDROXY (VIT D DEFICIENCY, FRACTURES): Vit D, 25-Hydroxy: 36 ng/mL (ref 30–100)

## 2021-10-15 LAB — IRON: Iron: 70 ug/dL (ref 45–160)

## 2021-10-15 LAB — TSH: TSH: 2.36 mIU/L (ref 0.40–4.50)

## 2021-10-15 LAB — T4, FREE: Free T4: 1.2 ng/dL (ref 0.8–1.8)

## 2021-10-20 ENCOUNTER — Ambulatory Visit (INDEPENDENT_AMBULATORY_CARE_PROVIDER_SITE_OTHER): Payer: 59 | Admitting: "Endocrinology

## 2021-10-21 NOTE — Progress Notes (Unsigned)
Subjective:  Patient Name: Alexa [name] Date of Birth: Oct 16, 1960  MRN: [id number]  Alexa [name]  presents to the office today for follow-up of her type 2 diabetes mellitus, obesity, combined hyperlipidemia, GERD, hypertension, ASHD, dyspepsia, pedal edema, non-proliferative diabetic retinopathy, goiter, depression, autonomic neuropathy, tachycardia, peripheral neuropathy, acquired hypothyroidism, vitamin D deficiency, hypocalcemia, secondary hyperparathyroidism, glossitis, pallor, fatigue, iron deficiency anemia, anhedonia, disinclination to take medicines, and status post Roux-en-Y gastric bypass.   HISTORY OF PRESENT ILLNESS:   Alexa [name] is a 61 y.o. Caucasian woman. Alexa [name] was unaccompanied.  1. The patient first presented to me on 08/20/04 in referral from her primary care internist, Dr. Emeline General, for evaluation and management of her type 2 diabetes, obesity, and multiple medical issues. She was 61 years old.   [name] had a long history of obesity. She was heavy as a child. She underwent menarche at age 56. At age 14 she was 180 pounds. In 1996 she weighed 218 pounds. In 1999 she was diagnosed with type 2 diabetes mellitus. Her weight at that point was 250 pounds. She was treated with Glucophage, and Actos. Actos made her gain even more weight. She had also been treated with glipizide in the past. More recently she had been treated with Lantus and Glucophage plus regular insulin as needed, especially for when she took steroids for asthma attacks. Maximum weight had been 296 pounds one month prior to that first visit with me. Her tendency to gain weight and her difficulty in losing weight were aggravated by long-standing, intermittent depression and by severe, recurrent asthma requiring the use of steroid medications.  B. Past medical history was also positive for a 60% blockage in one of her coronary arteries. She had significant issues with GERD and dyspepsia. She also had combined  hyperlipidemia. She had a previous cardiac catheterization and previous tonsillectomy. She was allergic to theophylline. Her pertinent review of systems was positive for some numbness and tingling in her feet. Family history was positive for type 2 diabetes in her father and her maternal grandmother. Her brother weighed 250 pounds at a height of 76 inches. Both her mother and her sister were hypothyroid.  C. On physical examination, her weight was 279.9 pounds. Her BMI was 49.6. Her blood pressure was 142/86. Her heart rate was 96. Her hemoglobin A1c was 9.8%. She was alert and oriented x3. Her affect was normal. Her insight was fair. She was obviously quite obese. She had a 25 gm thyroid gland. She had 1+ tremor of her hands. She had 1+ DP pulses and 2+ tinea pedis. Sensation to touch was intact in her feet. Laboratory data included a normal CMP. Her cholesterol was 175, triglycerides 76, HDL 53, and LDL 107. Her TSH was 2.98. Since she obviously did have type 2 diabetes mellitus associated with morbid obesity, and since weight loss was a major factor for her, I asked her to resume her metformin twice daily. I also started her on Byetta, initially 5 mcg twice daily and later 10 mcg twice daily. I discontinued her Lantus insulin.  2. During the last 17 years,we have had some successes, some failures, and some new problems.  A. T2DM:  1). In October 2007 the combination of Byetta and metformin was causing more gastrointestinal problems. She opted to stop the metformin and continue the Byetta because it was helping her with weight control and blood sugar control. By 08/11/06 her weight had decreased to 267.6 pounds. Hemoglobin A1c was 8.6%. At that  point she decided to have bariatric surgery. 2). She had a Roux-en-Y gastric bypass on 12/20/2006. She subsequently lost weight down to 173.4 pounds on 05/23/08, but then subsequently regained weight to the 190s. Her  hemoglobin A1c values varied in parallel with her  weights. On 05/23/08, at the point of her lowest weight, her hemoglobin A1c dropped to a nadir of 6.3%.  Since then her hemoglobin A1c values have varied between 6.6 and 13.4%.  3). Although we were initially able to stop all of her diabetes medicines after bariatric surgery, when she began to regain weight we restarted metformin, 500 mg twice daily. Although she took her metformin twice daily for a long time, she subsequently discontinued. She hated to take medicines and could not make herself be compliant in taking medications or exercising. When her HbA1c increased to 9.4% in September 2015, I stopped her Byetta injections and started her on liraglutide (Victoza) daily injections on 11/16/13. However, she was often noncompliant with Victoza as well.  4). Ms. Alexa [name], [place], at [facility] started [name] on [place] in August 2022. [name] lost weight very nicely. [name] was converted from [place] to [place] in January 2023.  B. Vitamin D deficiency, hypocalcemia, and secondary hyperparathyroidism:  1). Just before her gastric bypass, I obtained baseline bone mineral metabolism studies. Her 25-hydroxy vitamin D was very low at 7 (normal greater than or equal to 30). Her calcium was 9.5 (normal 8.6-10.6). Her parathyroid hormone was 38.4 (normal 14-72). Subsequent to her surgery, the patient was supposed to be taking multivitamins with calcium and vitamin D, but did not always do so.  2). On 03/29/2007 her 25-hydroxy vitamin D was 29, but her calcium decreased to 8.7. Her PTH was slightly elevated at 73.5. Her 1,25-hydroxy vitamin D was 46. Her iron was 56. I asked her to make sure she took her multivitamins and calcium daily.  3). Unfortunately, she has often been non-compliant with her multivitamins and calcium. Her 25-hydroxy vitamin D values have varied between 15-35. Her calcium values have varied between 8.1-9.4. Her PTH values have remained elevated between 79-167.  4). In May 2019  her vitamin D was at the lower end of the reference range, her calcium was about the 40% of the reference range, but her PTH was still significantly elevated. In December 2020 her PTH and calcium were mid-normal.  5). Over the years, her noncompliance with taking medications and supplements has varied significantly In the past year, however, she has been doing better at taking her vitamin D, calcium, and iron. In January 2023 her PTH was high-normal, her calcium was mid-normal, and her vitamin D was normal, but relatively low normal. Unfortunately, she has more recently stopped taking any calcium or vitamin D again.    C. Iron deficiency:  Adelena's iron levels have also been low, between 32-34. She was supposed to be taking iron every day, but frequently did not do so.  D. Psychological issues: 1).Mckinna has had psychological issues for many years that have adversely affected her eating, her unwillingness to exercise, her noncompliance with taking medications, her weight, and her T2DM care. Fortunately, her recent outpatient psychological therapy with Ms. Rea College, RN, MS and her taking Lexapro regularly had often resulted in [place] having a much more positive attitude about life in general and a generally more positive attitude about taking her medications.  2). She was started on Cymbalta in 2022 and her Lexapro was discontinued. Fortunately, Leota's has been dealing successfully with  her issues and her compliance has improved for some medications, but not for other medications and supplements..   E. Obesity: Although Bellany lost a great deal of weight in the year following her bariatric surgery, she later re-gained much of that weight. More recently she has been taking Wegovy or Ozempic and has been losing weight.  F. Elevated serum creatinine:  1). Dr. Renold Genta referred her to nephrology, Dr. Joylene Grapes. Her serum creatinine decreased to 0.92. She was initially diagnosed with stage 3A renal disease.  Her renal US in August 2021 was "unremarkable".  2). At her second visit Dr. Joylene Grapes told her not to come back.  G. Cardiology: She saw Dr. Irish Lack in August 2021. She was told to return if needed H. She contracted covid in February 2023. She had significant asthma problems, so had to have two rounds of steroids and resumed use of her asthma medications.  I.  She had a colonoscopy on 06/10/21. She had one polyp that was benign.   3. Jaren's last [facility] visit was on 08/20/21. At that visit I asked her to continue her morning dose of metformin of 500 mg and continue the evening dose of 1000 mg. I continued her 25 mcg Synthroid dose at bedtime. I asked her to take one Biotech, 50,000 IU capsule of vitamin D weekly and calcium, 600 mg, at each meal. I continued her Ozempic dose of 1.5 mg [place], weekly   A. In the interim she has been healthy, except for having more problems sleeping. She has resumed taking Xanax as needed. B. She has continued to lose weight, but more slowly over time.  C. Her shoulder pains improved  with Cymbalta.    D. She has been very anxious recently, largely due to stressors with their church.   E. Her father had stroke in late 2022. She and her sister are spending more time with him and her mother.  F. She has lost 4 more pounds since converting from [place] to [place].  G. She is walking some and doing more yard work. Her stamina has improved. She is followed at [facility] monthly.  H. She has been taking all of her other medications, including: Synthroid, 25 mcg/day; Micardis, 20 mg, twice daily, Cymbalta, and Coreg, 3.125 mg, twice daily, and Biotech weekly. She stopped taking Lexapro, Protonix. She is not taking all of her calcium supplements.  I. She uses a topical liquid for her tinea pedis. The tinea has much improved.  J. She pretty much stopped drinking cokes.   K. Her cardiologist at [facility] released her from that practice.    L. Her husband has heart  failure. He is doing "pretty good". He takes his medicine reliably.   M. She has some shakiness of her hands, especially the left hand. 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   N. She rarely has any hot flashes.   O. She has been having fewer leg cramps.  P. 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 She has been walking for exercise at times.  Q. She is trying to achieve more balance in her life. She changed her work hours to two days per week in late May.    4. Review of Systems:  Constitutional: "I'm feeling "good." Marital relations are going well.   Eyes: Her eyes feel better. Her last eye exam occurred in early 2023. She again had "the beginnings of diabetes eye disease, but nothing is worsening".   Neck: She occasionally  has posterior neck and trapezius pains. The patient has no complaints of anterior neck swelling, soreness, tenderness, pressure, or discomfort.  Heart: She occasionally has anxiety-related chest pain/pressure. Heart rate increases with exercise or other physical activity. She has no complaints of palpitations or irregular heat beats.  Gastrointestinal: She occasionally has nausea, much less since converting form Wegovy to Ozempic. She rarely  has the sensation of stomach pains. She rarely has post-prandial bloating if she eats too much. She always has constipation. She occasionally has head hunger and belly hunger. She is not having acid reflux, upset stomach, stomach aches or pains, swallowing difficulties, diarrhea. Her GI doctor told her to take Miralax daily, but she has not been doing so. She sometimes Has nausea and vomiting after taking dairy products.  Hands: She has tremors at times.  Legs: As above. She has more leg cramps after working. Her legs can sometimes feel "heavy" when she is in bed at night. She has few complaints of numbness, tingling, or burning. No edema is noted. Feet: There are few complaints of numbness, tingling, burning, or pain. No  edema is noted. Tinea improved with her new liquid therapy.   Neuro: No new sensory or muscular problems Psych: She is "doing better with Cymbalta".       GU: She has not been urinating excessively. She has not had much nocturia.   GYN: LMP was on 05/11/2010. She had a pap smear in 2020. She will have a follow up visit soon.  Hypoglycemia: She has not had many/any low BG symptoms since converting from [facility] to Ozempic.     5. BG printout: She is not checking BGs.  PAST MEDICAL, FAMILY, AND SOCIAL HISTORY:  Past Medical History:  Diagnosis Date   Allergy    Anemia, iron deficiency    Anhedonia    Anxiety    Asthma    Asthma, chronic    Back pain    CAD (coronary artery disease)    Cervical spondylosis 06/13/2020   Chewing difficulty    Chronic insomnia    Chronic kidney disease    CKD stage 3 A    Combined hyperlipidemia    Constipation    Depression    Diabetes mellitus    Diabetes mellitus type II    Diabetic autonomic neuropathy (HCC)    Difficulty swallowing pills    DM type 2 with diabetic peripheral neuropathy (HCC)    Dry mouth    Dyspepsia    Dyspnea    Elevated homocysteine    Essential tremor    Fatigue    Fatty liver    GERD (gastroesophageal reflux disease)    GERD (gastroesophageal reflux disease)    Glossitis    Goiter    Hemorrhoids    HLD (hyperlipidemia)    Hyperparathyroidism , secondary, non-renal (HCC)    Hypertension    Hypocalcemia    Insomnia    Leg edema    Leg pain    Macular degeneration    Nonproliferative diabetic retinopathy associated with type 2 diabetes mellitus (HCC)    RESOLVED   Obesity, Class III, BMI 40-49.9 (morbid obesity) (HCC)    Osteopenia    Osteoporosis    Pallor    PONV (postoperative nausea and vomiting)    Status post gastric surgery    Tachycardia    Thyroiditis, autoimmune    Vaginal dryness, menopausal    Vitamin D deficiency     Family History  Problem Relation Age of Onset  Thyroid disease Mother         Hypothyroid   Depression Mother    Liver disease Mother    Obesity Mother    Diabetes Father        T2 DM   Hypertension Father    Hyperlipidemia Father    Heart disease Father    Stroke Father    Kidney Stones Father    Obesity Brother        250 pounds at a height of 76 inches.   Thyroid disease Sister        Hypothyroid   Diabetes Maternal Grandmother    Colon cancer Other        great grandfather   Liver disease Maternal Aunt        NASH   Esophageal cancer Neg Hx    Pancreatic cancer Neg Hx    Kidney disease Neg Hx    Colon polyps Neg Hx    Rectal cancer Neg Hx    Stomach cancer Neg Hx      Current Outpatient Medications:    albuterol (PROVENTIL) (2.5 MG/3ML) 0.083% nebulizer solution, Take 3 mLs (2.5 mg total) by nebulization every 6 (six) hours as needed for wheezing or shortness of breath., Disp: 150 mL, Rfl: 1   albuterol (VENTOLIN HFA) 108 (90 Base) MCG/ACT inhaler, Inhale 2 puffs into the lungs every 6 (six) hours as needed for wheezing or shortness of breath., Disp: 6.7 g, Rfl: 11   albuterol (VENTOLIN HFA) 108 (90 Base) MCG/ACT inhaler, Inhale 2 puffs into the lungs every 6 (six) hours as needed for wheezing or shortness of breath., Disp: 8 g, Rfl: 0   ALPRAZolam (XANAX) 1 MG tablet, Take 1 tablet (1 mg total) by mouth at bedtime as needed for anxiety., Disp: 30 tablet, Rfl: 2   benzonatate (TESSALON) 100 MG capsule, Take 1 capsule (100 mg total) by mouth 3 (three) times daily as needed for cough., Disp: 30 capsule, Rfl: 0   betamethasone valerate ointment (VALISONE) 0.1 %, Apply 1 application topically 2 (two) times daily., Disp: 30 g, Rfl: 1   Blood Glucose Monitoring Suppl (FREESTYLE LITE) DEVI, Use to check glucose 3x daily, Disp: 2 each, Rfl: 5   calcium-vitamin D (OSCAL WITH D) 500-200 MG-UNIT TABS tablet, Take by mouth., Disp: , Rfl:    carvedilol (COREG) 3.125 MG tablet, Take 1 tablet (3.125 mg total) by mouth 2 (two) times daily with a meal., Disp:  120 tablet, Rfl: 10   Cholecalciferol 50 MCG (2000 UT) CAPS, Take 1 capsule by mouth daily., Disp: , Rfl:    conjugated estrogens (PREMARIN) vaginal cream, Place 1 applicatorful vaginally at bedtime for 21 days then off for 7 days., Disp: 30 g, Rfl: 11   Cyanocobalamin (VITAMIN B 12) 100 MCG LOZG, Take by mouth., Disp: , Rfl:    cyclobenzaprine (FLEXERIL) 10 MG tablet, Take 1 tablet (10 mg total) by mouth 3 (three) times daily as needed for muscle spasms., Disp: 90 tablet, Rfl: 0   DULoxetine (CYMBALTA) 60 MG capsule, Take 1 capsule (60 mg total) by mouth daily., Disp: 90 capsule, Rfl: 3   Efinaconazole 10 % SOLN, Apply 1 drop topically daily., Disp: 4 mL, Rfl: 11   Ferrous Sulfate (SLOW FE PO), Take 1 tablet by mouth daily. , Disp: , Rfl:    glucose blood (FREESTYLE LITE) test strip, Use 1 to check blood glucose 3 times daily., Disp: 300 each, Rfl: 1   HYDROcodone bit-homatropine (HYCODAN) 5-1.5 MG/5ML syrup, Take  5 mLs by mouth every 8 (eight) hours as needed for cough., Disp: 120 mL, Rfl: 0   ibuprofen (ADVIL,MOTRIN) 200 MG tablet, Take 600 mg by mouth daily as needed for headache or moderate pain., Disp: , Rfl:    metFORMIN (GLUCOPHAGE) 500 MG tablet, Take 1 tablet (500 mg total) by mouth 2 (two) times daily with a meal., Disp: 60 tablet, Rfl: 5   Multiple Vitamin (MULTIVITAMIN) tablet, Take 1 tablet by mouth daily. Reported on 02/26/2015, Disp: 30 tablet, Rfl: 0   ondansetron (ZOFRAN) 4 MG tablet, Take 1 tablet (4 mg total) by mouth every 8 (eight) hours as needed for nausea or vomiting., Disp: 20 tablet, Rfl: 0   Semaglutide, 2 MG/DOSE, 8 MG/3ML SOPN, Inject 2 mg as directed once a week., Disp: 9 mL, Rfl: 0   traZODone (DESYREL) 50 MG tablet, Take 1-2 tablets (50-100 mg total) by mouth at bedtime as needed for sleep, Disp: 180 tablet, Rfl: 3   Vitamin D, Ergocalciferol, (DRISDOL) 50000 units CAPS capsule, Take 50,000 Units by mouth every Sunday., Disp: , Rfl:    doxycycline (VIBRA-TABS) 100 MG  tablet, Take 1 tablet (100 mg total) by mouth 2 (two) times daily. (Patient not taking: Reported on 10/22/2021), Disp: 20 tablet, Rfl: 0   levothyroxine (SYNTHROID) 25 MCG tablet, TAKE 1 TABLET BY MOUTH ONCE DAILY, Disp: 90 tablet, Rfl: 1   pantoprazole (PROTONIX) 40 MG tablet, TAKE 1 TABLET (40 MG TOTAL) BY MOUTH DAILY., Disp: 90 tablet, Rfl: 4   telmisartan (MICARDIS) 40 MG tablet, TAKE 1/2 TABLET BY MOUTH TWO TIMES DAILY, Disp: 90 tablet, Rfl: 3  Allergies as of 10/22/2021 - Review Complete 10/22/2021  Allergen Reaction Noted   Theophyllines Palpitations 05/13/2010    1. Work and Family: She has reduced her workload to two days per week during the week.  2. Activities: She is very busy, but is not doing much exercise 3. Smoking, alcohol, or drugs: None 4. Primary Care Provider: Dr. Tommie Ard Baxley 5. Psych Ms. Deloria Lair, NP, [facility] and Ms. [place] 6. Bariatrician: Dr. Redgie Grayer, MD/Ms. Alexa Bathe, NP, [facility]  REVIEW OF SYSTEM: There are no other significant problems involving Mignon's other body systems.   Objective:  Vital Signs:  BP 122/80   Pulse 80   Wt 133 lb (60.3 kg)   LMP 05/11/2010   BMI 23.56 kg/m    Wt Readings from Last 3 Encounters:  10/22/21 133 lb (60.3 kg)  09/16/21 135 lb (61.2 kg)  09/09/21 134 lb (60.8 kg)    Ht Readings from Last 3 Encounters:  09/16/21 $RemoveB'5\' 3"'MOIoZCPb$  (1.6 m)  09/09/21 $RemoveB'5\' 3"'qrceDpRh$  (1.6 m)  06/10/21 $RemoveB'5\' 3"'htQwDSDg$  (1.6 m)   Body mass index is 23.56 kg/m.   Body surface area is 1.64 meters squared.   PHYSICAL EXAM:  Constitutional:  Ruthe looks great today, probably the best physically that I've ever seen her. She is much more slender. Emotionally she is very distraught because this will be our last visit together. She has lost 4 pounds since her last visit. Her weight is now much below the post-gastric bypass nadir of 173.4 pounds that it was in 2010. She is within 20 pounds of her Ideal  body weight of 120 pounds. She is alert and bright. Her affect and insight are normal.   Eyes: There is no arcus or proptosis.  Mouth: The oropharynx appears normal. The tongue appears glossier today. There is normal oral moisture. There is  no obvious gingivitis. Neck: There are no bruits present. The thyroid gland appears normal in size. The thyroid gland is again within normal limits at 18-20 grams in size. The left lobe is still a bit larger  than the right. The consistency of the thyroid gland is normal. There is no thyroid tenderness to palpation. Lungs: The lungs are clear. Air movement is good. Heart: The heart rhythm and rate appear normal. Heart sounds S1 and S2 are normal. I do not appreciate any pathologic heart murmurs. Abdomen: The abdomen is smaller. Bowel sounds are normal. The abdomen is soft and non-tender. There is no obviously palpable hepatomegaly, splenomegaly, or other masses.  Arms: Muscle mass appears appropriate for age.  Hands: She has a 1+ tremor. Phalangeal and metacarpophalangeal joints appear normal. Palms are normal. Legs: Muscle mass appears appropriate for age. There is no edema. Her shins are not tender to touch today.  Feet: There are no significant deformities. Dorsalis pedis pulses are 1+ on the right and 1+ on the left. She has much less visible tinea in her great toenails.   Neurologic: Muscle strength is normal for age and gender  in both the upper and the lower extremities. Muscle tone appears normal. Sensation to touch is normal in the legs and feet.   LAB DATA:  Labs 10/22/21: HbA1c 7.0%, CBG 107  Labs 10/14/21; TSH 2.36, free T4 1.2, free T3 3.0; CMP abnormal, with sodium 133 and creatinine 1.17; CBC abnormal, with Hgb 11.4 (ref 11.7-15.5), Hct 34.3 (ref 35-45), MCH 26.6 (ref 27-33); iron 70 (ref 45-160); PTH 160 (ref 16-77), calcium 9.0, 25-OH vitamin D 36;   Labs 08/20/21: HbA1c 7.6%, CBG 127  Labs 02/05/21: HbA1c 7.0%, CBG 151; TSH 2.28, free T4 1.2,  free T3 2.8; CMP normal; CBC normal; iron 73 (ref 45-160); PTH 69 (ref 16-77), calcium 9.5, 25-OH vitamin D 45  Labs 10/01/20: HbA1c 7.2%, CBG 131  Labs 07/23/20: CBC normal, except platelets 407 (ref 140-400); B. Burgdorff antibodies, IgM reactive for Lyme disease; RMSF antibodies not detected  Labs 05/16/20: HbA1c 7.5%; TSH 2.09: CMP normal, except glucose 135 and GFR 59; CBC normal; cholesterol 210, triglycerides 84, HDL 65, LDL 145;  Folate 11.9 (ref >5.4); ESR 6 (ref 0-30); Rheumatoid factor <35; cyclic citrulline peptide antibody <16; vitamin B12 1007 (ref [phone]); Urinary microalbumin/creatinine ratio 7; anti-Smith antibody <1; anti-DNA antibody <1; anti-nuclear antibody positive 1:40  Labs 12/11/19: HbA1c 8.5%  Labs 09/08/19: HbA1c 7.2%, CBG 98  Labs 09/04/19: PTH 40, calcium 8.9, 25-OH vitamin D 42  Labs 06/02/19: HbA1c 7.7%, CBG 138  Labs 02/02/19: HbA1c 7.7%, CBG 101  Labs 01/25/19: TSH 1.87, free T4 1.3, free T3 2.6; C-peptide 1.27 (ref 0.80-3.85); CMP normal except creatinine 1.25; cholesterol 169, triglycerides 81, HDL 65, LDL 87; PTH 42 (ref 14-64), calcium 9.4 (ref 8.6-10.4); urinary microalbumin/creatinine ratio 9 (ref <30)   Labs 09/08/18: HbA1c 8.2%, CBG 123  Labs 03/24/18: HbA1c 7.7%, CBG 103  Labs 01/03/18: CBG 112 fasting  Lbs 12/02/17: sodium 134, glucose 322, creatinine 1.17  Labs 11/16/17: Urine microalbumin/creatinine ratio 4.8; B12 282 (ref [phone]), folate 12.7 (ref >3.0)  Labs 11/01/17: HbA1c 6.9%, CBG 189 post-prandially; PTH 108, calcium 8.6, 25-OH vitamin D 64   Labs 09/13/17: Hepatic function panel normal; CBC normal  Labs 08/09/17: Hepatic function panel normal; CBC normal, except Hct 34.7 (35-45)  Labs 06/14/17: CBG 166  Labs 06/08/17: TSH 1.80, free T4 1.3, free T3 2.3; CMP normal except for glucose 115 and creatinine 1.27; PTH  120, calcium 9.7, 25-OH vitamin D 34;cholesterol 188, triglycerides 110, HDL 67, LDL 100; urine microalbumin/creatinine ratio    Labs 04/12/17: HbA1c 10.0%, CBG 174  Labs 03/04/17: Sodium 131, potassium 4.0, chloride 99, CO2 20, glucose 515, calcium 8.9  Labs 01/11/17: CBG 198  Labs 11/24/16: CBG 468; urine glucose 2000+, negative ketones  Labs 11/17/16: HbA1c 13.4%; TSH 1.54; cholesterol 173, triglycerides 91, HDL 71, LDL 83; 25-OH vitamin D 21; CMP normal except for glucose 314; CBC normal except MCHC 26.5 (ref 27-33)   06/04/16: HBA1c 8.1%, CBG 115  05/27/16: TSH 2.62, free T4 1.1, free T3 2.3; CMP normal except for glucose 161, and creatinine 1.09; cholesterol 187, triglycerides 77, HDL 74, LDL 98; urinary microalbumin/creatinine ratio 6; PTH 112, calcium 9.2, 25-OH vitamin D 33  03/17/16: CBG 145  Labs 12/31/15: HbA1c 13.4%, CBG 390; CMP normal except for glucose 344 and creatinine 1.14  Labs 10/08/15: HbA1c 9.6%  Labs 07/02/15: HbA1c 8.9%; PTH 107, calcium 9.2, 25-OH vitamin D 33; CBC normal; CMP normal except for creatinine 1.07; cholesterol 174, triglycerides 91, HDL 74, LDL 82; TSH 2.43, free T4 1.2, free T3 2.3  Labs 02/19/15: PTH 99, calcium 8.7, 25-OH vitamin D 20  Labs 10/08/14: HbA1c 8.0%   Labs 08/06/14: HbA1c 7.4%  Labs 08/01/14: TSH 2.014, free T4 0.87, free T3 2.4; PTH 79 (normal 14-64), calcium 9.1; 25-OH vitamin D 35 CMP normal  Labs 05/11/14: HbA1c was 7.7%, without any low BGs; TSH 2.416, free T4 1.13, free T3 2.3  Labs 02/22/14: HbA1c 11.6%  Labs 02/13/14: Calcium 9.0, PTH 131, 25-OH vitamin D 15; CMP normal except for glucose of 165; cholesterol 177, triglycerides 108, HDL 90, LDL 65; urinary microalbumin/creatinine ratio 15.6; TSH 3.639, free T4 1.30, free T3 2.5  Labs 10/10/13: HbA1c is 9.4%, compared with 8.1% at last visit and with 9.3% in December;   Labs 05/08/13: HbA1c 8.1%; PTH 90.2, calcium 9.5, 25-hydroxy vitamin D 46; CMP normal except for glucose of 215; WBC 8.0, RBC 5.20, Hgb 12.5, Hct 37.8%, MCV 72.7 (normal 78-100), iron 55  Labs 02/23/13: CMP normal, except glucose 194 and  alkaline phosphatase 126; microalbumin/creatinine ratio was 11.5; TSH 2.086, free T4 1.29, free T3 3.2, TPO antibody < 10; 25-hydroxy vitamin D 18; C-peptide 1.79; cholesterol 156, triglycerides 64, HDL 61, LDL 82  Labs 01/05/13: Hemoglobin A1c was 9.3%, compared with 9.3 at last visit and with 9.1% at the visit prior. All of these values were major increases from 6.6% in May 2012.    Labs 04/14/12: 25-hydroxy vitamin D 21, iron 203,  Vitamin B12 379,   Labs 11/16/11: Hgb 10.3, Hct 32.4%; CMP normal except glucose 146' cholesterol 201, triglycerides 70, HDL 66, LDL 121; vitamin D 22           Labs 06/09/10: Cholesterol was 175, triglycerides 76, HDL 62, LDL 98. TSH was 1.217. Free T4 was 1.10. Free T3 was 2.7. 25-hydroxy vitamin D was 19. PTH was 167.  IMAGING:  BMD 09/08/19: T-score of forearm radius is -3.8, c/w osteoporosis.    Assessment and Plan:   ASSESSMENT:  1. Type 2 diabetes mellitus:   A. During the past four years her T2DM control has waxed and waned, with HbA1c values varying from 6.9-13.4%.    B. At today's visit she has lost another 4 pounds. However, since converting from [facility] to [place], her HbA1c has decreased to 7.0%.   C. My goal is to reduce her HbA1c to <6.5% if we can safely do  so.  D. If she will resume taking her metformin and continue her other medications and exercise, she can probably each that goal.  2. Hypoglycemia: She has not had any symptoms for some time.  3. Obesity: Her weight has decreased 4 pounds since her last visit. She needs to take her medications, continue to Eat Right and exercise daily.  4-5. Autonomic neuropathy and tachycardia:  A. The patient's autonomic neuropathy and  heart rate improved as her BGs decreased, but worsened as her BGs increased.  B. Although her HbA1c is higher, her heart rate is still within normal limits.  6. Hypertension: Her BP is normal today. She still needs to try to fit in more exercise and take her medications.   7-8. Anhedonia and anxiety:  1). She is doing better, despite discontinuing Cymbalta. She may need to resume that medication if her anxiety worsens.  2). She still needs to call for help when she begins to do poorly.  3). She also needs to continue to find ways to have more joy in her life. 9-12. GERD/esophagitis/difficulty swallowing/gastroparesis: Her reflux, other GI symptoms, and difficulty swallowing have all improved.   13-15: Hypocalcemia, vitamin D deficiency, and secondary hyperparathyroidism:   A. Due to her bariatric surgery she has more difficulty absorbing vitamin D and calcium. When she develops hypocalcemia and vitamin D deficiency, her parathyroid glands appropriately produce more PTH, resulting in secondary hyperparathyroidism. However, if she takes her calcium and vitamin D all three parameters will normalize.   B. Her calcium was normal in October 2018, but at the lower limit of normal in February 2019. Her vitamin D level was low-normal in May 2019.   C. In May 2019 her PTH was elevated to 120 and her calcium was 9.7. She still had secondary hyperparathyroidism. In October 2019 her calcium was at the low end of the reference range and her PTH was still elevated, but lower.  D. Her PTH level in August 2021 was mid-normal. Her calcium was at the low end of the reference range, because she was taking less calcium than she had intended. Her vitamin D level was good. She needed to increase her calcium intake to 600 mg, three times daily and take her vitamin D and calcium very consistently.   E. Since her last visit she stopped taking calcium and vitamin D again. She needs to resume taking the calcium and vitamin D. Two Tums per day will probably be adequate.  40. Noncompliance: Since last visit her compliance with eating right and taking her medications has generally been unchanged.   17: Muscle cramps: Her cramps occur more frequently now, presumably due to having less vitamin D and  calcium on board. .  18. Hypothyroidism: Her TFTs in May 2019 and in December 2020 were normal. Her TSH in April 2022 was normal. Her current levothyroxine dosage is working well if she takes it. 19. Sleeping difficulties: These problems have improved.  20. Osteopenia/osteoporosis: Davey's secondary hyperparathyroidism had normalized, but is worse again. She has not been consistently taking her calcium supplements. She had a BMD study in August 2021. Her osteoporosis was somewhat worse. She needs to take her calcium and vitamin D.   PLAN:  1. Diagnostic: I reviewed her lab results from September 2022 and January 2023. I ordered TFTs, CMP, PTH, 25-OH vitamin D, CBC, iron..    2. Therapeutic: Continue 25 mcg Synthroid at bedtime, Coreg , and Micardis.Resume 600 mg of calcium at mealtimes, [employer], 50,000 IU per week,  plus 2000 IU daily. Resume metformin, 500 mg, twice daily. Try to fit in exercise daily.  Eat meals that are relatively high in protein and relatively low in carbs. Eat Right Diet. Continue Ozempic, 1.5 mg, sq weekly. Check BGs 1-2 times er day.  3. Patient education: We discussed all of the above at great length. She must continue to take her medications and supplements. She also needs to have frequent follow up.  4. Follow-up: with her PCP and possibly with anew endocrinologist   Level of Service: This visit lasted in excess of 55 minutes. More than 50% of the visit was devoted to counseling.   Tillman Sers, MD, [facility] 10/22/2021 11:47 AM

## 2021-10-22 ENCOUNTER — Ambulatory Visit (INDEPENDENT_AMBULATORY_CARE_PROVIDER_SITE_OTHER): Payer: 59 | Admitting: "Endocrinology

## 2021-10-22 ENCOUNTER — Encounter (INDEPENDENT_AMBULATORY_CARE_PROVIDER_SITE_OTHER): Payer: Self-pay | Admitting: "Endocrinology

## 2021-10-22 VITALS — BP 122/80 | HR 80 | Wt 133.0 lb

## 2021-10-22 DIAGNOSIS — E063 Autoimmune thyroiditis: Secondary | ICD-10-CM

## 2021-10-22 DIAGNOSIS — R4584 Anhedonia: Secondary | ICD-10-CM

## 2021-10-22 DIAGNOSIS — F411 Generalized anxiety disorder: Secondary | ICD-10-CM

## 2021-10-22 DIAGNOSIS — E1140 Type 2 diabetes mellitus with diabetic neuropathy, unspecified: Secondary | ICD-10-CM | POA: Diagnosis not present

## 2021-10-22 DIAGNOSIS — E1143 Type 2 diabetes mellitus with diabetic autonomic (poly)neuropathy: Secondary | ICD-10-CM | POA: Diagnosis not present

## 2021-10-22 DIAGNOSIS — R000 Tachycardia, unspecified: Secondary | ICD-10-CM

## 2021-10-22 DIAGNOSIS — D508 Other iron deficiency anemias: Secondary | ICD-10-CM | POA: Diagnosis not present

## 2021-10-22 DIAGNOSIS — E211 Secondary hyperparathyroidism, not elsewhere classified: Secondary | ICD-10-CM | POA: Diagnosis not present

## 2021-10-22 DIAGNOSIS — M818 Other osteoporosis without current pathological fracture: Secondary | ICD-10-CM

## 2021-10-22 DIAGNOSIS — I10 Essential (primary) hypertension: Secondary | ICD-10-CM

## 2021-10-22 DIAGNOSIS — E11649 Type 2 diabetes mellitus with hypoglycemia without coma: Secondary | ICD-10-CM | POA: Diagnosis not present

## 2021-10-22 DIAGNOSIS — E049 Nontoxic goiter, unspecified: Secondary | ICD-10-CM

## 2021-10-22 DIAGNOSIS — E559 Vitamin D deficiency, unspecified: Secondary | ICD-10-CM

## 2021-10-22 DIAGNOSIS — K219 Gastro-esophageal reflux disease without esophagitis: Secondary | ICD-10-CM

## 2021-10-22 LAB — POCT GLUCOSE (DEVICE FOR HOME USE): Glucose Fasting, POC: 107 mg/dL — AB (ref 70–99)

## 2021-10-22 LAB — POCT GLYCOSYLATED HEMOGLOBIN (HGB A1C): Hemoglobin A1C: 7 % — AB (ref 4.0–5.6)

## 2021-10-22 NOTE — Patient Instructions (Signed)
No further follow up here.  At [facility], we are committed to providing exceptional care. You will receive a patient satisfaction survey through text or email regarding your visit today. Your opinion is important to me. Comments are appreciated.  

## 2021-10-23 ENCOUNTER — Other Ambulatory Visit (INDEPENDENT_AMBULATORY_CARE_PROVIDER_SITE_OTHER): Payer: Self-pay | Admitting: "Endocrinology

## 2021-10-23 ENCOUNTER — Other Ambulatory Visit (HOSPITAL_COMMUNITY): Payer: Self-pay

## 2021-10-23 DIAGNOSIS — E063 Autoimmune thyroiditis: Secondary | ICD-10-CM

## 2021-10-23 DIAGNOSIS — I10 Essential (primary) hypertension: Secondary | ICD-10-CM

## 2021-10-23 LAB — C-PEPTIDE: C-Peptide: 1.71 ng/mL (ref 0.80–3.85)

## 2021-10-23 MED ORDER — TELMISARTAN 40 MG PO TABS
20.0000 mg | ORAL_TABLET | Freq: Two times a day (BID) | ORAL | 3 refills | Status: DC
  Filled 2021-10-23: qty 90, 90d supply, fill #0
  Filled 2022-01-23: qty 90, 90d supply, fill #1

## 2021-10-23 MED ORDER — LEVOTHYROXINE SODIUM 25 MCG PO TABS
25.0000 ug | ORAL_TABLET | Freq: Every day | ORAL | 1 refills | Status: DC
  Filled 2021-10-23: qty 90, 90d supply, fill #0
  Filled 2022-01-23: qty 90, 90d supply, fill #1

## 2021-10-29 DIAGNOSIS — F39 Unspecified mood [affective] disorder: Secondary | ICD-10-CM | POA: Diagnosis not present

## 2021-11-10 ENCOUNTER — Other Ambulatory Visit (HOSPITAL_COMMUNITY): Payer: Self-pay

## 2021-11-10 ENCOUNTER — Encounter (INDEPENDENT_AMBULATORY_CARE_PROVIDER_SITE_OTHER): Payer: Self-pay | Admitting: Family Medicine

## 2021-11-10 ENCOUNTER — Ambulatory Visit (INDEPENDENT_AMBULATORY_CARE_PROVIDER_SITE_OTHER): Payer: 59 | Admitting: Family Medicine

## 2021-11-10 VITALS — BP 141/84 | HR 95 | Temp 97.8°F | Ht 63.0 in | Wt 134.0 lb

## 2021-11-10 DIAGNOSIS — Z9884 Bariatric surgery status: Secondary | ICD-10-CM | POA: Diagnosis not present

## 2021-11-10 DIAGNOSIS — Z7985 Long-term (current) use of injectable non-insulin antidiabetic drugs: Secondary | ICD-10-CM | POA: Diagnosis not present

## 2021-11-10 DIAGNOSIS — M818 Other osteoporosis without current pathological fracture: Secondary | ICD-10-CM

## 2021-11-10 DIAGNOSIS — E1169 Type 2 diabetes mellitus with other specified complication: Secondary | ICD-10-CM | POA: Diagnosis not present

## 2021-11-10 DIAGNOSIS — M810 Age-related osteoporosis without current pathological fracture: Secondary | ICD-10-CM | POA: Insufficient documentation

## 2021-11-10 DIAGNOSIS — Z6824 Body mass index (BMI) 24.0-24.9, adult: Secondary | ICD-10-CM | POA: Diagnosis not present

## 2021-11-10 DIAGNOSIS — E669 Obesity, unspecified: Secondary | ICD-10-CM | POA: Diagnosis not present

## 2021-11-10 DIAGNOSIS — Z6830 Body mass index (BMI) 30.0-30.9, adult: Secondary | ICD-10-CM

## 2021-11-10 MED ORDER — SEMAGLUTIDE (2 MG/DOSE) 8 MG/3ML ~~LOC~~ SOPN
2.0000 mg | PEN_INJECTOR | SUBCUTANEOUS | 0 refills | Status: DC
  Filled 2021-11-10: qty 6, 56d supply, fill #0
  Filled 2021-11-17: qty 3, 28d supply, fill #0
  Filled 2021-12-22: qty 3, 28d supply, fill #1

## 2021-11-10 MED ORDER — VITAMIN D (ERGOCALCIFEROL) 1.25 MG (50000 UNIT) PO CAPS
50000.0000 [IU] | ORAL_CAPSULE | ORAL | 1 refills | Status: DC
  Filled 2021-11-10: qty 4, 28d supply, fill #0
  Filled 2022-01-23: qty 4, 28d supply, fill #1

## 2021-11-11 ENCOUNTER — Other Ambulatory Visit (HOSPITAL_COMMUNITY): Payer: Self-pay

## 2021-11-13 ENCOUNTER — Encounter: Payer: Self-pay | Admitting: Internal Medicine

## 2021-11-13 ENCOUNTER — Ambulatory Visit: Payer: 59 | Admitting: Internal Medicine

## 2021-11-13 VITALS — BP 128/80 | HR 90 | Temp 98.0°F | Ht 63.5 in | Wt 135.1 lb

## 2021-11-13 DIAGNOSIS — E559 Vitamin D deficiency, unspecified: Secondary | ICD-10-CM

## 2021-11-13 DIAGNOSIS — E1169 Type 2 diabetes mellitus with other specified complication: Secondary | ICD-10-CM

## 2021-11-13 DIAGNOSIS — J4521 Mild intermittent asthma with (acute) exacerbation: Secondary | ICD-10-CM | POA: Diagnosis not present

## 2021-11-13 DIAGNOSIS — Z9884 Bariatric surgery status: Secondary | ICD-10-CM

## 2021-11-13 DIAGNOSIS — F419 Anxiety disorder, unspecified: Secondary | ICD-10-CM | POA: Diagnosis not present

## 2021-11-13 DIAGNOSIS — B351 Tinea unguium: Secondary | ICD-10-CM

## 2021-11-13 DIAGNOSIS — K5909 Other constipation: Secondary | ICD-10-CM | POA: Diagnosis not present

## 2021-11-13 DIAGNOSIS — Z23 Encounter for immunization: Secondary | ICD-10-CM | POA: Diagnosis not present

## 2021-11-13 DIAGNOSIS — Z0000 Encounter for general adult medical examination without abnormal findings: Secondary | ICD-10-CM | POA: Diagnosis not present

## 2021-11-13 DIAGNOSIS — E119 Type 2 diabetes mellitus without complications: Secondary | ICD-10-CM | POA: Diagnosis not present

## 2021-11-13 DIAGNOSIS — F439 Reaction to severe stress, unspecified: Secondary | ICD-10-CM | POA: Diagnosis not present

## 2021-11-13 DIAGNOSIS — F32A Depression, unspecified: Secondary | ICD-10-CM

## 2021-11-13 DIAGNOSIS — E211 Secondary hyperparathyroidism, not elsewhere classified: Secondary | ICD-10-CM

## 2021-11-13 DIAGNOSIS — F418 Other specified anxiety disorders: Secondary | ICD-10-CM

## 2021-11-13 DIAGNOSIS — M810 Age-related osteoporosis without current pathological fracture: Secondary | ICD-10-CM

## 2021-11-13 DIAGNOSIS — F5102 Adjustment insomnia: Secondary | ICD-10-CM

## 2021-11-13 LAB — POCT URINALYSIS DIPSTICK
Bilirubin, UA: NEGATIVE
Blood, UA: NEGATIVE
Glucose, UA: NEGATIVE
Leukocytes, UA: NEGATIVE
Nitrite, UA: NEGATIVE
Protein, UA: NEGATIVE
Spec Grav, UA: 1.015 (ref 1.010–1.025)
Urobilinogen, UA: 0.2 E.U./dL
pH, UA: 5 (ref 5.0–8.0)

## 2021-11-13 NOTE — Progress Notes (Signed)
Subjective:    Patient ID: Alexa [name], female    DOB: 10/18/1960, 61 y.o.   MRN: [id number]  HPI 61 year old Female seen for Health maintenance exam and evaluation of medical issues.  At last physical exam in April 2022 she was having recurrent low back pain and was out of work due to severity of the pain.  She had a positive ANA at that time with titer of 1: 40 and pattern was nuclear, homogeneous.  This is a common finding in females.  I did not feel she had lupus.  She was working three 12-hour shifts per week.  She was tired.  She had tingling in feet and hands and some arm pain and numbness.  Had noticed some weakness in her hands left greater than right.  Pain in her neck extended down left and right upper extremities but was worse on the left.  FMLA paperwork was completed for her and she was eventually able to return to work.  She also saw cathy Matilde Sprang, her therapist who referred her to [facility].  Has been diagnosed with GAD, insomnia, PTSD and MDD.  Previously tried Ambien and Lexapro.  Trazodone was increased to help with insomnia.  Work and got very stressful at that point.  She says she is doing much better.  Had colonoscopy by Dr. Loletha Carrow in May 2023.  Had precancerous polyps and he recommended repeat colonoscopy in 3 years.  Is seeing Dr. Earleen Newport for onychomycosis.  Is followed by Dr. Tobe Sos for diabetes mellitus but Dr. Tobe Sos will be leaving the [facility] health system soon.  She was seen in August for chest pain in the emergency department.  It was felt she did not have aortic dissection, pericarditis or myocardial ischemia.  Currently on trazodone and Cymbalta for Behavioral Health.  History of gastric bypass surgery in 2008.  She was able to stop all diabetic medicines after this surgery but then began to regain weight and Dr. Tobe Sos restarted her on metformin.  She quit taking it.  In September 2015 her  hemoglobin A1c  was9.4%.  He stopped Byette and started her on   Victoza.  She takes B12 due to history of gastric bypass surgery.  In January 2003 she changed from [facility] to [facility].  She has lost weight and has done well.  Her PTH levels have varied with Dr. Loren Racer care between 79 and 167.  She saw Dr. Joylene Grapes regarding elevated serum creatinine and was diagnosed as stage IIIa renal disease.  Renal ultrasound in August 2021 was unremarkable.  She says Dr. Joylene Grapes did not feel she needed further follow-up with nephrology.  She saw Dr. Irish Lack in August 2021.  She had COVID-19 in February 2023.  History of left sciatica January 2020.  Had right sciatica in 2017.  Also had right sciatica in 2015 and left lumbar radiculopathy in 2019.  Generally prefers not to go to physical therapy but usually responds to medication and rest.  Had eye exam with Dr. Raliegh Scarlet.  There has been some stress within her church.  Her father had a stroke in late 2022.  In September her PTH was elevated at 160 and Serum was normal at 9.0.  Dr. Tobe Sos feels she has secondary hyperparathyroidism.  She has not been consistently taking calcium supplements.  He wants her to take calcium and vitamin D.  She will need to find a new endocrinologist as Dr. Tobe Sos is leaving the system.  She has a long history of  obesity.  She was heavy as a child.  Had menarche at age 34 about age 56 she weighed 180 pounds.  She was diagnosed with diabetes in 1999 and at that point her weight was 250 pounds.  Family history: Remarkable for diabetes mellitus in father and maternal grandmother.  Both mother and her sister with hypothyroidism.  Social history: Married.  Husband has heart failure.  Father had stroke in 2022.  1 sister.  No children.   History of iron deficiency in 2014 with level being 26.  Last iron level checked September 2023 was normal at 45.  Vitamin D level in September 2023 was low normal at 36.   Review of Systems see above     Objective:   Physical Exam Vital signs reviewed.   Blood pressure 128/80, pulse 90, temperature 98 degrees, pulse oximetry 97%, weight 135 pounds 1.9 ounces, height 5 feet 3.5 inches BMI 23.56 Skin: Warm and dry.  TMs clear.  Pharynx is clear.  Neck is supple without JVD thyromegaly or carotid bruits.  Chest clear.  Cardiac exam: Regular rate and rhythm without ectopy.  Abdomen soft nondistended without hepatosplenomegaly masses or tenderness.  No lower extremity pitting edema.  Affect is within normal limits.  No neurological deficits on brief neurological exam      Assessment & Plan:  Type 2 diabetes mellitus-seen by Dr. Tobe Sos but will need to see new Endocrinologist  Obesity seen at [facility] healthy weight clinic-currently on semaglutide  Anxiety treated at [facility] with Xanax, Cymbalta, Seroquel  History of depression and PTSD seen at Behavioral health and [employer], counselor counselor.  Treated with Cymbalta  History of vitamin D deficiency in 2018-level was 21 and has been on vitamin D supplementation 50,000 units weekly  Hypertension treated with Micardis  GE reflux treated with Protonix   History of secondary hyperparathyroidism  History of onychomycosis seen by podiatrist  History of gastric bypass surgery  Hypertension treated with Micardis 20 mg daily  Hypothyroidism treated with low-dose Synthroid  History of reactive airways disease treated with Ventolin  Plan: Return in 1 year or as needed.  She will need to find another endocrinologist who can address her endocrinology issues.  Encouraged her to continue with counseling and current medications.  She is being referred to [facility] endocrinology.  Received flu vaccine through employment.  Pneumococcal 20 vaccine given today.  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  She declines RSV vaccine at this point.

## 2021-11-13 NOTE — Patient Instructions (Addendum)
Patient is being referred to [facility] Endocrinology as Dr. Tobe Sos is leaving. Receives flu vaccine through employment. 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 Pneumococcal 20 vaccine given today. RTC in one year or as needed.

## 2021-11-14 LAB — MICROALBUMIN, URINE: Microalb, Ur: 3.6 mg/dL

## 2021-11-17 ENCOUNTER — Other Ambulatory Visit (HOSPITAL_COMMUNITY): Payer: Self-pay

## 2021-11-17 ENCOUNTER — Other Ambulatory Visit (INDEPENDENT_AMBULATORY_CARE_PROVIDER_SITE_OTHER): Payer: Self-pay | Admitting: "Endocrinology

## 2021-11-17 DIAGNOSIS — K219 Gastro-esophageal reflux disease without esophagitis: Secondary | ICD-10-CM

## 2021-11-18 ENCOUNTER — Other Ambulatory Visit (HOSPITAL_COMMUNITY): Payer: Self-pay

## 2021-11-18 NOTE — Progress Notes (Unsigned)
Chief Complaint:   OBESITY Alexa [name] is here to discuss her progress with her obesity treatment plan along with follow-up of her obesity related diagnoses. Alexa [name] is on practicing portion control and making smarter food choices, such as increasing vegetables and decreasing simple carbohydrates and states she is following her eating plan approximately 75% of the time. Alexa [name] states she is walking and resistance bands 30 minutes 3 times per week.  Today's visit was #: 82 Starting weight: 169 lbs Starting date: 11/16/2017 Today's weight: 135 lbs Today's date: 11/10/2021 Total lbs lost to date: 34 lbs Total lbs lost since last in-office visit: +1 lb  Interim History: Doing fairly well with food choices, portions are smaller.  S/P RYGB in 2008, plus Ozempic 2 mg weekly.  Has a good support system.  She is doing better with exercise.  Learning to take days off work and to say "no".   Subjective:   1. Type 2 diabetes mellitus with other specified complication, unspecified whether long term insulin use (HCC) Increased Ozempic to 2 mg once weekly, she has been tolerating well.  Last A1c is good at 7.0, 10/22/2021 and metformin 500 mg BID.   2. Other osteoporosis, unspecified pathological fracture presence Awaiting a DEXA scan and will ask PCP.  Inconsistent with taking calcium supplement.  Not on treatment for osteoporosis.    3. H/O gastric bypass Inconsistent with supplement intake.  Improved protein intake with a goal of 70 grams per day.   Assessment/Plan:   1. Type 2 diabetes mellitus with other specified complication, unspecified whether long term insulin use (HCC) Refill - Semaglutide, 2 MG/DOSE, 8 MG/3ML SOPN; Inject 2 mg as directed once a week.  Dispense: 6 mL; Refill: 0  2. Other osteoporosis, unspecified pathological fracture presence Follow up  with PCP and  Endocrinology.    Refill - Vitamin D, Ergocalciferol, (DRISDOL) 1.25 MG (50000 UNIT) CAPS capsule; Take 1 capsule  (50,000 Units total) by mouth every Sunday.  Dispense: 5 capsule; Refill: 1  3. H/O gastric bypass Multivitamin daily, low calorie, higher protein diet.   4. Obesity, current BMI 24.0 1) Continue maintenance for weight loss. 2) Reviewed [employer] results.   Alexa [name] is currently in the action stage of change. As such, her goal is to continue with weight loss efforts. She has agreed to practicing portion control and making smarter food choices, such as increasing vegetables and decreasing simple carbohydrates.   Exercise goals:  Resistance training 3 times per week.  10,000 steps per day.   Behavioral modification strategies: increasing lean protein intake, increasing vegetables, increasing water intake, decreasing eating out, no skipping meals, meal planning and cooking strategies, keeping healthy foods in the home, and decreasing junk food.  Alexa [name] has agreed to follow-up with our clinic in 2 months. She was informed of the importance of frequent follow-up visits to maximize her success with intensive lifestyle modifications for her multiple health conditions.   Objective:   Blood pressure (!) 141/84, pulse 95, temperature 97.8 F (36.6 C), height '5\' 3"'$  (1.6 m), weight 134 lb (60.8 kg), last menstrual period 05/11/2010, SpO2 98 %. Body mass index is 23.74 kg/m.  General: Cooperative, alert, well developed, in no acute distress. HEENT: Conjunctivae and lids unremarkable. Cardiovascular: Regular rhythm.  Lungs: Normal work of breathing. Neurologic: No focal deficits.   Lab Results  Component Value Date   CREATININE 1.17 (H) 10/14/2021   BUN 16 10/14/2021   NA 133 (L) 10/14/2021   K 4.2 10/14/2021  CL 99 10/14/2021   CO2 22 10/14/2021   Lab Results  Component Value Date   ALT 13 10/14/2021   AST 14 10/14/2021   ALKPHOS 85 05/27/2016   BILITOT 0.5 10/14/2021   Lab Results  Component Value Date   HGBA1C 7.0 (A) 10/22/2021   HGBA1C 7.6 (A) 08/20/2021   HGBA1C 7.0 (A)  02/05/2021   HGBA1C 7.6 (A) 10/01/2020   HGBA1C 7.5 (H) 05/16/2020   Lab Results  Component Value Date   INSULIN 8.1 11/16/2017   Lab Results  Component Value Date   TSH 2.36 10/14/2021   Lab Results  Component Value Date   CHOL 210 (H) 05/16/2020   HDL 65 05/16/2020   LDLCALC 127 (H) 05/16/2020   TRIG 84 05/16/2020   CHOLHDL 3.2 05/16/2020   Lab Results  Component Value Date   VD25OH 36 10/14/2021   VD25OH 45 02/05/2021   VD25OH 71 03/27/2019   Lab Results  Component Value Date   WBC 5.7 10/14/2021   HGB 11.4 (L) 10/14/2021   HCT 34.3 (L) 10/14/2021   MCV 80.0 10/14/2021   PLT 340 10/14/2021   Lab Results  Component Value Date   IRON 70 10/14/2021   TIBC 357 04/14/2012   Attestation Statements:   Reviewed by clinician on day of visit: allergies, medications, problem list, medical history, surgical history, family history, social history, and previous encounter notes.  I, Davy Pique, am acting as Location manager for Loyal Gambler, DO.  I have reviewed the above documentation for accuracy and completeness, and I agree with the above. Dell Ponto, DO

## 2021-11-19 ENCOUNTER — Other Ambulatory Visit (HOSPITAL_COMMUNITY): Payer: Self-pay

## 2021-11-24 ENCOUNTER — Other Ambulatory Visit (HOSPITAL_COMMUNITY): Payer: Self-pay

## 2021-11-26 ENCOUNTER — Encounter: Payer: Self-pay | Admitting: Adult Health

## 2021-11-26 ENCOUNTER — Other Ambulatory Visit (HOSPITAL_COMMUNITY): Payer: Self-pay

## 2021-11-26 ENCOUNTER — Telehealth (INDEPENDENT_AMBULATORY_CARE_PROVIDER_SITE_OTHER): Payer: 59 | Admitting: Adult Health

## 2021-11-26 DIAGNOSIS — G4700 Insomnia, unspecified: Secondary | ICD-10-CM | POA: Diagnosis not present

## 2021-11-26 DIAGNOSIS — F39 Unspecified mood [affective] disorder: Secondary | ICD-10-CM | POA: Diagnosis not present

## 2021-11-26 DIAGNOSIS — F331 Major depressive disorder, recurrent, moderate: Secondary | ICD-10-CM | POA: Diagnosis not present

## 2021-11-26 DIAGNOSIS — F4310 Post-traumatic stress disorder, unspecified: Secondary | ICD-10-CM | POA: Diagnosis not present

## 2021-11-26 DIAGNOSIS — F411 Generalized anxiety disorder: Secondary | ICD-10-CM

## 2021-11-26 MED ORDER — DULOXETINE HCL 60 MG PO CPEP
60.0000 mg | ORAL_CAPSULE | Freq: Every day | ORAL | 3 refills | Status: AC
  Filled 2021-11-26 – 2022-01-23 (×2): qty 90, 90d supply, fill #0

## 2021-11-26 MED ORDER — ALPRAZOLAM 1 MG PO TABS
1.0000 mg | ORAL_TABLET | Freq: Every evening | ORAL | 2 refills | Status: DC | PRN
  Filled 2021-11-26: qty 30, 30d supply, fill #0
  Filled 2021-12-20: qty 30, 30d supply, fill #1
  Filled 2022-01-20: qty 30, 30d supply, fill #2

## 2021-11-26 MED ORDER — QUETIAPINE FUMARATE 50 MG PO TABS
50.0000 mg | ORAL_TABLET | Freq: Every day | ORAL | 2 refills | Status: DC
  Filled 2021-11-26: qty 60, 30d supply, fill #0
  Filled 2022-01-23: qty 60, 30d supply, fill #1

## 2021-11-26 NOTE — Progress Notes (Signed)
[name] [id number] [birth year] 61 y.o.  Virtual Visit via Video Note  I connected with pt @ on 11/26/21 at  1:00 PM EDT by a video enabled telemedicine application and verified that I am speaking with the correct person using two identifiers.   I discussed the limitations of evaluation and management by telemedicine and the availability of in person appointments. The patient expressed understanding and agreed to proceed.  I discussed the assessment and treatment plan with the patient. The patient was provided an opportunity to ask questions and all were answered. The patient agreed with the plan and demonstrated an understanding of the instructions.   The patient was advised to call back or seek an in-person evaluation if the symptoms worsen or if the condition fails to improve as anticipated.  I provided 25 minutes of non-face-to-face time during this encounter.  The patient was located at home.  The provider was located at [place].   Aloha Gell, NP   Subjective:   Patient ID:  Alexa [name] is a 61 y.o. (DOB [birth year]) female.  Chief Complaint: No chief complaint on file.   HPI Alexa [name] presents for follow-up of MDD, GAD, insomnia and PTSD.   Describes mood today as "ok". Pleasant. Denies tearfulness. Mood symptoms - denies depression. Reports increased anxiety and irritability. Feels like she has over committed herself and is "worn out". Has started to cancel some appointments/obligations so she can get some rest. Reports some worry and rumination - "trying not to do that". Mood is lower. Stating "I've over extended myself and taken on too much". Reports increased situational stressors aiding in mood instability. Willing to consider other options. Seeing therapist Alexa [name]. Stable interest and motivation. Taking medications as prescribed.  Energy levels stable - feels exhausted. Active, walking more. Enjoys some usual interests and activities.  Married x 21 years. Lives with husband and 3 cats. Spending time with family and friends Appetite adequate. Weight stable - 138 pounds. Sleeping difficulties. Averages 4 hours of interrupted sleep. Focus and concentration stable. Completing tasks. Managing aspects of household. Working 2 days a week - 12 hour shifts. Has worked as a Marine scientist for 36 years. Denies SI or HI.  Denies AH or VH. Denies self harm. Denies substance use.  Seeing therapist monthly - Alexa [name]  Multiple health issues.  Previous medication trials:  Lexapro, Ambien,     Review of Systems:  Review of Systems  Musculoskeletal:  Negative for gait problem.  Neurological:  Negative for tremors.  Psychiatric/Behavioral:         Please refer to HPI    Medications: I have reviewed the patient's current medications.  Current Outpatient Medications  Medication Sig Dispense Refill   QUEtiapine (SEROQUEL) 50 MG tablet Take one to two tablets at bedtime. 60 tablet 2   albuterol (PROVENTIL) (2.5 MG/3ML) 0.083% nebulizer solution Take 3 mLs (2.5 mg total) by nebulization every 6 (six) hours as needed for wheezing or shortness of breath. 150 mL 1   albuterol (VENTOLIN HFA) 108 (90 Base) MCG/ACT inhaler Inhale 2 puffs into the lungs every 6 (six) hours as needed for wheezing or shortness of breath. 6.7 g 11   albuterol (VENTOLIN HFA) 108 (90 Base) MCG/ACT inhaler Inhale 2 puffs into the lungs every 6 (six) hours as needed for wheezing or shortness of breath. 8 g 0   ALPRAZolam (XANAX) 1 MG tablet Take 1 tablet (1 mg total) by mouth at bedtime as needed for anxiety. [place]  tablet 2   benzonatate (TESSALON) 100 MG capsule Take 1 capsule (100 mg total) by mouth 3 (three) times daily as needed for cough. 30 capsule 0   betamethasone valerate ointment (VALISONE) 0.1 % Apply 1 application topically 2 (two) times daily. 30 g 1   Blood Glucose Monitoring Suppl (FREESTYLE LITE) DEVI Use to check glucose 3x daily 2 each 5    calcium-vitamin D (OSCAL WITH D) 500-200 MG-UNIT TABS tablet Take by mouth.     carvedilol (COREG) 3.125 MG tablet Take 1 tablet (3.125 mg total) by mouth 2 (two) times daily with a meal. 120 tablet 10   Cholecalciferol 50 MCG (2000 UT) CAPS Take 1 capsule by mouth daily.     conjugated estrogens (PREMARIN) vaginal cream Place 1 applicatorful vaginally at bedtime for 21 days then off for 7 days. 30 g 11   Cyanocobalamin (VITAMIN B 12) 100 MCG LOZG Take by mouth.     cyclobenzaprine (FLEXERIL) 10 MG tablet Take 1 tablet (10 mg total) by mouth 3 (three) times daily as needed for muscle spasms. 90 tablet 0   DULoxetine (CYMBALTA) 60 MG capsule Take 1 capsule (60 mg total) by mouth daily. 90 capsule 3   Efinaconazole 10 % SOLN Apply 1 drop topically daily. 4 mL 11   Ferrous Sulfate (SLOW FE PO) Take 1 tablet by mouth daily.      glucose blood (FREESTYLE LITE) test strip Use 1 to check blood glucose 3 times daily. 300 each 1   HYDROcodone bit-homatropine (HYCODAN) 5-1.5 MG/5ML syrup Take 5 mLs by mouth every 8 (eight) hours as needed for cough. 120 mL 0   ibuprofen (ADVIL,MOTRIN) 200 MG tablet Take 600 mg by mouth daily as needed for headache or moderate pain.     levothyroxine (SYNTHROID) 25 MCG tablet Take 1 tablet (25 mcg total) by mouth daily. 90 tablet 1   metFORMIN (GLUCOPHAGE) 500 MG tablet Take 1 tablet (500 mg total) by mouth 2 (two) times daily with a meal. 60 tablet 5   Multiple Vitamin (MULTIVITAMIN) tablet Take 1 tablet by mouth daily. Reported on 02/26/2015 30 tablet 0   ondansetron (ZOFRAN) 4 MG tablet Take 1 tablet (4 mg total) by mouth every 8 (eight) hours as needed for nausea or vomiting. 20 tablet 0   pantoprazole (PROTONIX) 40 MG tablet TAKE 1 TABLET (40 MG TOTAL) BY MOUTH DAILY. 90 tablet 4   Semaglutide, 2 MG/DOSE, 8 MG/3ML SOPN Inject 2 mg as directed once a week. 6 mL 0   telmisartan (MICARDIS) 40 MG tablet Take 0.5 tablets (20 mg total) by mouth 2 (two) times daily. 90 tablet 3    Vitamin D, Ergocalciferol, (DRISDOL) 1.25 MG (50000 UNIT) CAPS capsule Take 1 capsule (50,000 Units total) by mouth every Sunday. 5 capsule 1   No current facility-administered medications for this visit.    Medication Side Effects: None  Allergies:  Allergies  Allergen Reactions   Theophyllines Palpitations    Past Medical History:  Diagnosis Date   Allergy    Anemia, iron deficiency    Anhedonia    Anxiety    Asthma    Asthma, chronic    Back pain    CAD (coronary artery disease)    Cervical spondylosis 06/13/2020   Chewing difficulty    Chronic insomnia    Chronic kidney disease    CKD stage 3 A    Combined hyperlipidemia    Constipation    Depression    Diabetes mellitus  Diabetes mellitus type II    Diabetic autonomic neuropathy (HCC)    Difficulty swallowing pills    DM type 2 with diabetic peripheral neuropathy (HCC)    Dry mouth    Dyspepsia    Dyspnea    Elevated homocysteine    Essential tremor    Fatigue    Fatty liver    GERD (gastroesophageal reflux disease)    GERD (gastroesophageal reflux disease)    Glossitis    Goiter    Hemorrhoids    HLD (hyperlipidemia)    Hyperparathyroidism , secondary, non-renal (HCC)    Hypertension    Hypocalcemia    Insomnia    Leg edema    Leg pain    Macular degeneration    Nonproliferative diabetic retinopathy associated with type 2 diabetes mellitus (HCC)    RESOLVED   Obesity, Class III, BMI 40-49.9 (morbid obesity) (HCC)    Osteopenia    Osteoporosis    Pallor    PONV (postoperative nausea and vomiting)    Status post gastric surgery    Tachycardia    Thyroiditis, autoimmune    Vaginal dryness, menopausal    Vitamin D deficiency     Family History  Problem Relation Age of Onset   Thyroid disease Mother        Hypothyroid   Depression Mother    Liver disease Mother    Obesity Mother    Diabetes Father        T2 DM   Hypertension Father    Hyperlipidemia Father    Heart disease Father     Stroke Father    Kidney Stones Father    Obesity Brother        250 pounds at a height of 76 inches.   Thyroid disease Sister        Hypothyroid   Diabetes Maternal Grandmother    Colon cancer Other        great grandfather   Liver disease Maternal Aunt        NASH   Esophageal cancer Neg Hx    Pancreatic cancer Neg Hx    Kidney disease Neg Hx    Colon polyps Neg Hx    Rectal cancer Neg Hx    Stomach cancer Neg Hx     Social History   Socioeconomic History   Marital status: Married    Spouse name: Corporate investment banker   Number of children: 0   Years of education: Not on file   Highest education level: Not on file  Occupational History   Occupation: Therapist, sports  Tobacco Use   Smoking status: Never   Smokeless tobacco: Never  Vaping Use   Vaping Use: Never used  Substance and Sexual Activity   Alcohol use: No    Alcohol/week: 0.0 standard drinks of alcohol   Drug use: No   Sexual activity: Not on file  Other Topics Concern   Not on file  Social History Narrative   Not on file   Social Determinants of Health   Financial Resource Strain: Not on file  Food Insecurity: Not on file  Transportation Needs: Not on file  Physical Activity: Not on file  Stress: Not on file  Social Connections: Not on file  Intimate Partner Violence: Not on file    Past Medical History, Surgical history, Social history, and Family history were reviewed and updated as appropriate.   Please see review of systems for further details on the patient's review from today.   Objective:  Physical Exam:  LMP 05/11/2010   Physical Exam Constitutional:      General: She is not in acute distress. Musculoskeletal:        General: No deformity.  Neurological:     Mental Status: She is alert and oriented to person, place, and time.     Coordination: Coordination normal.  Psychiatric:        Attention and Perception: Attention and perception normal. She does not perceive auditory or visual hallucinations.         Mood and Affect: Mood normal. Mood is not anxious or depressed. Affect is not labile, blunt, angry or inappropriate.        Speech: Speech normal.        Behavior: Behavior normal.        Thought Content: Thought content normal. Thought content is not paranoid or delusional. Thought content does not include homicidal or suicidal ideation. Thought content does not include homicidal or suicidal plan.        Cognition and Memory: Cognition and memory normal.        Judgment: Judgment normal.     Comments: Insight intact     Lab Review:     Component Value Date/Time   NA 133 (L) 10/14/2021 1525   K 4.2 10/14/2021 1525   CL 99 10/14/2021 1525   CO2 22 10/14/2021 1525   GLUCOSE 98 10/14/2021 1525   BUN 16 10/14/2021 1525   CREATININE 1.17 (H) 10/14/2021 1525   CALCIUM 9.0 10/14/2021 1525   CALCIUM 9.0 10/14/2021 1525   CALCIUM 9.3 08/14/2011 1217   PROT 6.4 10/14/2021 1525   ALBUMIN 4.1 05/27/2016 0828   AST 14 10/14/2021 1525   ALT 13 10/14/2021 1525   ALKPHOS 85 05/27/2016 0828   BILITOT 0.5 10/14/2021 1525   GFRNONAA 43 (L) 09/16/2021 1153   GFRNONAA 59 (L) 05/16/2020 1050   GFRAA 69 05/16/2020 1050       Component Value Date/Time   WBC 5.7 10/14/2021 1525   RBC 4.29 10/14/2021 1525   HGB 11.4 (L) 10/14/2021 1525   HCT 34.3 (L) 10/14/2021 1525   PLT 340 10/14/2021 1525   MCV 80.0 10/14/2021 1525   MCH 26.6 (L) 10/14/2021 1525   MCHC 33.2 10/14/2021 1525   RDW 14.4 10/14/2021 1525   LYMPHSABS 2,457 10/14/2021 1525   MONOABS 0.8 10/19/2015 1327   EOSABS 63 10/14/2021 1525   BASOSABS 40 10/14/2021 1525    No results found for: "POCLITH", "LITHIUM"   No results found for: "PHENYTOIN", "PHENOBARB", "VALPROATE", "CBMZ"   .res Assessment: Plan:    Plan:  PDMP reviewed  Xanax '1mg'$  at hs Cymbalta '60mg'$  daily D/C Trazadone '50mg'$  at hs to 1 to 2 tablets at bedtime. Add Seroquel '50mg'$  - take one to two tabs at hs.  RTC 4 weeks  Patient advised to contact  office with any questions, adverse effects, or acute worsening in signs and symptoms.   Time spent with patient was 25 minutes. Greater than 50% of face to face time with patient was spent on counseling and coordination of care.    Diagnoses and all orders for this visit:  Major depressive disorder, recurrent episode, moderate (HCC) -     DULoxetine (CYMBALTA) 60 MG capsule; Take 1 capsule (60 mg total) by mouth daily.  Insomnia, unspecified type -     QUEtiapine (SEROQUEL) 50 MG tablet; Take one to two tablets at bedtime.  PTSD (post-traumatic stress disorder) -     DULoxetine (CYMBALTA)  60 MG capsule; Take 1 capsule (60 mg total) by mouth daily.  Generalized anxiety disorder -     ALPRAZolam (XANAX) 1 MG tablet; Take 1 tablet (1 mg total) by mouth at bedtime as needed for anxiety. -     DULoxetine (CYMBALTA) 60 MG capsule; Take 1 capsule (60 mg total) by mouth daily.     Please see After Visit Summary for patient specific instructions.  Future Appointments  Date Time Provider [place]  01/14/2022 12:40 PM Bowen, Collene Leyden, DO [facility] None  11/16/2022 10:00 AM [facility]  11/19/2022 10:00 AM Baxley, Cresenciano Lick, MD [facility]    No orders of the defined types were placed in this encounter.     -------------------------------

## 2021-11-27 ENCOUNTER — Telehealth: Payer: Self-pay | Admitting: Internal Medicine

## 2021-11-27 ENCOUNTER — Other Ambulatory Visit (HOSPITAL_COMMUNITY): Payer: Self-pay

## 2021-11-27 NOTE — Telephone Encounter (Signed)
LVM to CB to talk about Endocrinology referral and options.

## 2021-11-28 NOTE — Telephone Encounter (Signed)
Sent mychart message with endocrinologist names and phone numbers

## 2021-12-22 ENCOUNTER — Other Ambulatory Visit (HOSPITAL_COMMUNITY): Payer: Self-pay

## 2022-01-14 ENCOUNTER — Ambulatory Visit (INDEPENDENT_AMBULATORY_CARE_PROVIDER_SITE_OTHER): Payer: 59 | Admitting: Family Medicine

## 2022-01-14 ENCOUNTER — Encounter (INDEPENDENT_AMBULATORY_CARE_PROVIDER_SITE_OTHER): Payer: Self-pay | Admitting: Family Medicine

## 2022-01-14 ENCOUNTER — Other Ambulatory Visit (HOSPITAL_COMMUNITY): Payer: Self-pay

## 2022-01-14 VITALS — BP 176/95 | HR 68 | Temp 97.9°F | Ht 63.0 in | Wt 136.0 lb

## 2022-01-14 DIAGNOSIS — Z9884 Bariatric surgery status: Secondary | ICD-10-CM | POA: Diagnosis not present

## 2022-01-14 DIAGNOSIS — Z7985 Long-term (current) use of injectable non-insulin antidiabetic drugs: Secondary | ICD-10-CM

## 2022-01-14 DIAGNOSIS — E1169 Type 2 diabetes mellitus with other specified complication: Secondary | ICD-10-CM

## 2022-01-14 DIAGNOSIS — Z6824 Body mass index (BMI) 24.0-24.9, adult: Secondary | ICD-10-CM | POA: Diagnosis not present

## 2022-01-14 DIAGNOSIS — I10 Essential (primary) hypertension: Secondary | ICD-10-CM

## 2022-01-14 DIAGNOSIS — E669 Obesity, unspecified: Secondary | ICD-10-CM | POA: Diagnosis not present

## 2022-01-14 MED ORDER — SEMAGLUTIDE (2 MG/DOSE) 8 MG/3ML ~~LOC~~ SOPN
2.0000 mg | PEN_INJECTOR | SUBCUTANEOUS | 0 refills | Status: DC
  Filled 2022-01-14: qty 3, 28d supply, fill #0

## 2022-01-15 ENCOUNTER — Other Ambulatory Visit (HOSPITAL_COMMUNITY): Payer: Self-pay

## 2022-01-20 ENCOUNTER — Other Ambulatory Visit: Payer: Self-pay

## 2022-01-22 NOTE — Progress Notes (Signed)
Chief Complaint:   OBESITY Alexa [name] is here to discuss her progress with her obesity treatment plan along with follow-up of her obesity related diagnoses. Alexa [name] is on practicing portion control and making smarter food choices, such as increasing vegetables and decreasing simple carbohydrates and states she is following her eating plan approximately 75% of the time. Alexa [name] states she is walking 20-30 minutes 2-3 times per week.  Today's visit was #: 52 Starting weight: 169 LBS Starting date: 11/16/2017 Today's weight: 136 LBS Today's date: 01/14/2022 Total lbs lost to date: 33 LBS Total lbs lost since last in-office visit: +2 LBS  Interim History: Sometimes having mini can of soda and half Sweet tea.  Patient struggles to get in enough water.  Forgetting to take blood pressure medication.  Work stress is high.  Plans to take a vacation.  Subjective:   1. S/P gastric bypass Patient has adequate restriction.  Patient denies food aversion, nausea or vomiting.  2. Essential hypertension Blood pressure elevated today.  Patient is on Micardis 20 mg twice daily, carvedilol 3.125 mg twice daily, the patient has not taken either for 2 days.  She denies headache or chest pain today.  3. Type 2 diabetes mellitus with other specified complication, without long-term current use of insulin (HCC) Last A1c was 7.0 on 10/22/2021.  Patient is on Ozempic 2 mg weekly.  She enjoys satiety without GI side effects.  She is taking metformin 500 mg twice daily  Assessment/Plan:   1. S/P gastric bypass Add  a multivitamin once daily. Continue small portion sizes with lean protein at each meal.  2. Essential hypertension Take all blood pressure medications as prescribed.  Check blood pressure at work for home  3. Type 2 diabetes mellitus with other specified complication, without long-term current use of insulin (HCC) Continue metformin per endocrinology.  Recheck A1c in 2 months.  Refill-  Semaglutide, 2 MG/DOSE, 8 MG/3ML SOPN; Inject 2 mg as directed once a week.  Dispense: 3 mL; Refill: 0  4. Obesity,current BMI 24.1 1.  Add resistance training 2 days/week. 2.  Increase water intake and decrease sweet tea and soda.  Alexa [name] is currently in the action stage of change. As such, her goal is to continue with weight loss efforts. She has agreed to practicing portion control and making smarter food choices, such as increasing vegetables and decreasing simple carbohydrates.   Exercise goals:  Cardio and weights at least 150 minutes/week.  Behavioral modification strategies: increasing lean protein intake, increasing vegetables, increasing water intake, decreasing liquid calories, no skipping meals, meal planning and cooking strategies, holiday eating strategies , and planning for success.  Alexa [name] has agreed to follow-up with our clinic in 5 weeks. She was informed of the importance of frequent follow-up visits to maximize her success with intensive lifestyle modifications for her multiple health conditions.   Objective:   Blood pressure (!) 176/95, pulse 68, temperature 97.9 F (36.6 C), height '5\' 3"'$  (1.6 m), weight 136 lb (61.7 kg), last menstrual period 05/11/2010, SpO2 100 %. Body mass index is 24.09 kg/m.  General: Cooperative, alert, well developed, in no acute distress. HEENT: Conjunctivae and lids unremarkable. Cardiovascular: Regular rhythm.  Lungs: Normal work of breathing. Neurologic: No focal deficits.   Lab Results  Component Value Date   CREATININE 1.17 (H) 10/14/2021   BUN 16 10/14/2021   NA 133 (L) 10/14/2021   K 4.2 10/14/2021   CL 99 10/14/2021   CO2 22 10/14/2021   Lab Results  Component Value Date   ALT 13 10/14/2021   AST 14 10/14/2021   ALKPHOS 85 05/27/2016   BILITOT 0.5 10/14/2021   Lab Results  Component Value Date   HGBA1C 7.0 (A) 10/22/2021   HGBA1C 7.6 (A) 08/20/2021   HGBA1C 7.0 (A) 02/05/2021   HGBA1C 7.6 (A) 10/01/2020   HGBA1C  7.5 (H) 05/16/2020   Lab Results  Component Value Date   INSULIN 8.1 11/16/2017   Lab Results  Component Value Date   TSH 2.36 10/14/2021   Lab Results  Component Value Date   CHOL 210 (H) 05/16/2020   HDL 65 05/16/2020   LDLCALC 127 (H) 05/16/2020   TRIG 84 05/16/2020   CHOLHDL 3.2 05/16/2020   Lab Results  Component Value Date   VD25OH 36 10/14/2021   VD25OH 45 02/05/2021   VD25OH 71 03/27/2019   Lab Results  Component Value Date   WBC 5.7 10/14/2021   HGB 11.4 (L) 10/14/2021   HCT 34.3 (L) 10/14/2021   MCV 80.0 10/14/2021   PLT 340 10/14/2021   Lab Results  Component Value Date   IRON 70 10/14/2021   TIBC 357 04/14/2012   Attestation Statements:   Reviewed by clinician on day of visit: allergies, medications, problem list, medical history, surgical history, family history, social history, and previous encounter notes.  I, Alexa [name], am acting as Location manager for Loyal Gambler, DO.  I have reviewed the above documentation for accuracy and completeness, and I agree with the above. Dell Ponto, DO

## 2022-01-23 ENCOUNTER — Other Ambulatory Visit (HOSPITAL_COMMUNITY): Payer: Self-pay

## 2022-02-12 ENCOUNTER — Other Ambulatory Visit (HOSPITAL_BASED_OUTPATIENT_CLINIC_OR_DEPARTMENT_OTHER): Payer: Self-pay

## 2022-02-12 ENCOUNTER — Telehealth: Payer: Self-pay | Admitting: Internal Medicine

## 2022-02-12 ENCOUNTER — Other Ambulatory Visit (HOSPITAL_COMMUNITY): Payer: Self-pay

## 2022-02-12 ENCOUNTER — Ambulatory Visit (INDEPENDENT_AMBULATORY_CARE_PROVIDER_SITE_OTHER): Payer: 59 | Admitting: Internal Medicine

## 2022-02-12 VITALS — BP 122/76 | HR 73 | Temp 98.9°F

## 2022-02-12 DIAGNOSIS — F32A Depression, unspecified: Secondary | ICD-10-CM

## 2022-02-12 DIAGNOSIS — F419 Anxiety disorder, unspecified: Secondary | ICD-10-CM

## 2022-02-12 DIAGNOSIS — J4521 Mild intermittent asthma with (acute) exacerbation: Secondary | ICD-10-CM

## 2022-02-12 DIAGNOSIS — F439 Reaction to severe stress, unspecified: Secondary | ICD-10-CM

## 2022-02-12 DIAGNOSIS — E119 Type 2 diabetes mellitus without complications: Secondary | ICD-10-CM | POA: Diagnosis not present

## 2022-02-12 DIAGNOSIS — Z9884 Bariatric surgery status: Secondary | ICD-10-CM | POA: Diagnosis not present

## 2022-02-12 DIAGNOSIS — I10 Essential (primary) hypertension: Secondary | ICD-10-CM

## 2022-02-12 MED ORDER — BUDESONIDE-FORMOTEROL FUMARATE 160-4.5 MCG/ACT IN AERO
2.0000 | INHALATION_SPRAY | Freq: Two times a day (BID) | RESPIRATORY_TRACT | 3 refills | Status: DC
  Filled 2022-02-12: qty 10.2, 30d supply, fill #0

## 2022-02-12 MED ORDER — METHYLPREDNISOLONE 4 MG PO TABS
ORAL_TABLET | ORAL | 0 refills | Status: DC
  Filled 2022-02-12: qty 42, 12d supply, fill #0

## 2022-02-12 NOTE — Patient Instructions (Addendum)
Out of work today. Note provided. Take Medrol 4 mg tabs in tapering course over 12 days as directed.Symbicort inhaler refilled.  Blood pressure is stable on Coreg.  Continue Cymbalta.  Cymbalta helps back pain in addition to depression.  CPE due in October.

## 2022-02-12 NOTE — Telephone Encounter (Signed)
Alexa [name] [phone]  Deborrah called to say back in late December she had cough and congestion and now she has tightness in her chest and a little cough and sometimes has shortness of breath. She is thinking she it may be her Asthma but she does not hear any wheezing. I scheduled her to come in at 3:30 today

## 2022-02-12 NOTE — Progress Notes (Signed)
   Subjective:    Patient ID: Alexa [name], female    DOB: 1960-07-13, 62 y.o.   MRN: [id number]  HPI Here today for prolonged chest congestion around Christmas. Did not test for Covid at that time nor recently. Some situational stress discussed. 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 Recently was out of work for 2 days. Have prescribed Medrol dose pack today.  That should help back pain and respiratory congestion. Symbicort refilled. Has albuterol inhaler. Has seen [employer] for counseling in the Fall of 2023.    Review of Systems chest tightness in chest.  Some mild shortness of breath     Objective:   Physical Exam  Blood pressure 122/76, pulse 73, temperature 98.9 degrees pulse oximetry 98% on room air Skin: Warm and dry.  Nodes none.  TMs are clear.  Neck is supple.  Chest clear.  Cardiac exam: Regular rate and rhythm without ectopy.     Assessment & Plan:   Respiratory congestion-do not believe she has pneumonia.  Albuterol inhaler may be used 4 times daily.  Refill Symbicort inhaler.  Have prescribed tapering course of Medrol 4 mg tablets starting with 6 tablets day 1 and decreasing by 1 tablet every 2 days.  Diagnosis is mild intermittent asthma with an acute exacerbation  History of recurrent low back pain  Situational stress-currently on Cymbalta 60 mg daily per Jeoffrey Massed NP.  History of anxiety and depression  Essential hypertension-blood pressure stable on Coreg and Micardis  History of gastric bypass surgery  Type 2 diabetes mellitus without current use of insulin.  Currently on semaglutide  Hypothyroidism stable with low-dose levothyroxine 25 mcg daily  Type 2 diabetes mellitus not insulin-dependent  Plan: Have advised patient to consider intermittent FMLA.  Medrol Dosepak prescribed today which should help with respiratory congestion and musculoskeletal pain.  Symbicort has been refilled and she already has an albuterol inhaler.

## 2022-02-17 ENCOUNTER — Telehealth: Payer: Self-pay | Admitting: Internal Medicine

## 2022-02-17 NOTE — Telephone Encounter (Signed)
Shantavia called to say she is having some forms faxed here from [employer] to fill out so when she misses work she will not lose her job, since she is part time.

## 2022-02-18 DIAGNOSIS — F39 Unspecified mood [affective] disorder: Secondary | ICD-10-CM | POA: Diagnosis not present

## 2022-02-19 DIAGNOSIS — Z0289 Encounter for other administrative examinations: Secondary | ICD-10-CM

## 2022-02-19 NOTE — Telephone Encounter (Signed)
Faxed FMLA to [employer] [phone]  Leave of Absence # [phone]

## 2022-02-19 NOTE — Telephone Encounter (Signed)
This message was sent via [employer], a product from [employer] http://www.biscom.com/                    -------Fax Transmission Report-------  To:               Recipient at [phone] Subject:          FW: Hp Scans Result:           The transmission was successful. Explanation:      All Pages Ok Pages Sent:       6 Connect Time:     3 minutes, 16 seconds Transmit Time:    02/19/2022 17:13 Transfer Rate:    14400 Status Code:      0000 Retry Count:      0 Job Id:           [id number] Unique Id:        [id number] Fax Line:         64 Fax Server:       [email or web address]

## 2022-02-23 ENCOUNTER — Other Ambulatory Visit (HOSPITAL_COMMUNITY): Payer: Self-pay

## 2022-02-23 ENCOUNTER — Encounter (INDEPENDENT_AMBULATORY_CARE_PROVIDER_SITE_OTHER): Payer: Self-pay | Admitting: Family Medicine

## 2022-02-23 ENCOUNTER — Ambulatory Visit (INDEPENDENT_AMBULATORY_CARE_PROVIDER_SITE_OTHER): Payer: 59 | Admitting: Family Medicine

## 2022-02-23 VITALS — BP 149/83 | HR 78 | Temp 98.1°F | Ht 63.0 in | Wt 135.0 lb

## 2022-02-23 DIAGNOSIS — E559 Vitamin D deficiency, unspecified: Secondary | ICD-10-CM | POA: Diagnosis not present

## 2022-02-23 DIAGNOSIS — Z6824 Body mass index (BMI) 24.0-24.9, adult: Secondary | ICD-10-CM

## 2022-02-23 DIAGNOSIS — E669 Obesity, unspecified: Secondary | ICD-10-CM | POA: Diagnosis not present

## 2022-02-23 DIAGNOSIS — E1169 Type 2 diabetes mellitus with other specified complication: Secondary | ICD-10-CM

## 2022-02-23 DIAGNOSIS — Z7984 Long term (current) use of oral hypoglycemic drugs: Secondary | ICD-10-CM

## 2022-02-23 DIAGNOSIS — Z7985 Long-term (current) use of injectable non-insulin antidiabetic drugs: Secondary | ICD-10-CM

## 2022-02-23 DIAGNOSIS — Z9884 Bariatric surgery status: Secondary | ICD-10-CM | POA: Diagnosis not present

## 2022-02-23 DIAGNOSIS — I10 Essential (primary) hypertension: Secondary | ICD-10-CM | POA: Diagnosis not present

## 2022-02-23 MED ORDER — VITAMIN D (ERGOCALCIFEROL) 1.25 MG (50000 UNIT) PO CAPS
50000.0000 [IU] | ORAL_CAPSULE | ORAL | 1 refills | Status: DC
  Filled 2022-02-23: qty 4, 28d supply, fill #0

## 2022-02-23 MED ORDER — SEMAGLUTIDE (2 MG/DOSE) 8 MG/3ML ~~LOC~~ SOPN
2.0000 mg | PEN_INJECTOR | SUBCUTANEOUS | 1 refills | Status: DC
  Filled 2022-02-23 – 2022-03-24 (×2): qty 3, 28d supply, fill #0
  Filled 2022-04-30 – 2022-05-01 (×2): qty 3, 28d supply, fill #1

## 2022-02-23 NOTE — Telephone Encounter (Signed)
Faxed for the 2nd time  This message was sent via [place], a product from [employer] http://www.bis[employer].com/                    -------Fax Transmission Report-------  To:               Recipient at [phone] Subject:          FW: Hp Scans Result:           The transmission was successful. Explanation:      All Pages Ok Pages Sent:       5 Connect Time:     3 minutes, 3 seconds Transmit Time:    02/23/2022 12:10 Transfer Rate:    14400 Status Code:      0000 Retry Count:      0 Job Id:           [id number] Unique Id:        [id number] Fax Line:         71 Fax Server:       [employer]

## 2022-03-04 ENCOUNTER — Other Ambulatory Visit (HOSPITAL_COMMUNITY): Payer: Self-pay

## 2022-03-04 DIAGNOSIS — H52203 Unspecified astigmatism, bilateral: Secondary | ICD-10-CM | POA: Diagnosis not present

## 2022-03-04 DIAGNOSIS — F39 Unspecified mood [affective] disorder: Secondary | ICD-10-CM | POA: Diagnosis not present

## 2022-03-04 DIAGNOSIS — H353121 Nonexudative age-related macular degeneration, left eye, early dry stage: Secondary | ICD-10-CM | POA: Diagnosis not present

## 2022-03-04 LAB — HM DIABETES EYE EXAM

## 2022-03-05 NOTE — Progress Notes (Signed)
Chief Complaint:   OBESITY Alexa [name] is here to discuss her progress with her obesity treatment plan along with follow-up of her obesity related diagnoses. Alexa [name] is on practicing portion control and making smarter food choices, such as increasing vegetables and decreasing simple carbohydrates and states she is following her eating plan approximately 80% of the time. Alexa [name] states she is walking for 30 minutes 3 times per week.  Today's visit was #: 49 Starting weight: 169 lbs Starting date: 11/16/2017 Today's weight: 135 lbs Today's date: 02/23/22 Total lbs lost to date: 34 lbs Total lbs lost since last in-office visit: -1  Interim History: Trying to get in more water but continues to drink some sugar sweetened beverages.  Did get up to 142 pounds over the holidays but got back on track.  Has not added in weight training.  Feels good satiety on Ozempic.  Subjective:   1. Weight gain status post gastric bypass Improving. Has improved fullness.  Annual labs are up-to-date. Aiming for 85 g of protein daily.  2. Type 2 diabetes mellitus with other specified complication, without long-term current use of insulin (HCC) Last A1c 7.0 on 10/22/2021. Taking metformin 500 mg twice daily plus Ozempic 2 mg subcu weekly. Having more polyuria.  Not checking a.m. sugars.  3. Essential hypertension Blood pressure was elevated today. Taking carvedilol 3.125 mg twice daily and Micardis 40 mg daily. Has not been taking blood pressure meds consistently.  4. Vitamin D deficiency Taking prescription vitamin D 50,000 IU weekly. Energy level improving.  Assessment/Plan:   1. Weight gain status post gastric bypass 4-5 small meals per day, each with lean protein recommended.  2. Type 2 diabetes mellitus with other specified complication, without long-term current use of insulin ([place]) 1. Refilled: - Semaglutide, 2 MG/DOSE, 8 MG/3ML SOPN; Inject 2 mg as directed once a week.  Dispense: 3 mL;  Refill: 1 2.  Reduce sugar sweetened beverages and increase exercise time. 3.  Recheck A1c next visit.  3. Essential hypertension Reminded patient to take all blood pressure meds as prescribed.  4. Vitamin D deficiency Refilled: - Vitamin D, Ergocalciferol, (DRISDOL) 1.25 MG (50000 UNIT) CAPS capsule; Take 1 capsule (50,000 Units total) by mouth every Sunday.  Dispense: 5 capsule; Refill: 1  5. Obesity, current BMI 24.0 1.  Increase water intake to greater than 64 ounces per day. 2.  Add in water exercise 1-2 days/week. 3.  Cut back on sugar sweetened beverages.  Alexa [name] is currently in the action stage of change. As such, her goal is to continue with weight loss efforts. She has agreed to practicing portion control and making smarter food choices, such as increasing vegetables and decreasing simple carbohydrates with 85 g of protein daily.  Exercise goals: as is  Behavioral modification strategies: increasing lean protein intake, increasing water intake, decreasing liquid calories, decreasing eating out, no skipping meals, meal planning and cooking strategies, keeping healthy foods in the home, and avoiding temptations.  Alexa [name] has agreed to follow-up with our clinic in 6-7 weeks. She was informed of the importance of frequent follow-up visits to maximize her success with intensive lifestyle modifications for her multiple health conditions.   Objective:   Blood pressure (!) 149/83, pulse 78, temperature 98.1 F (36.7 C), height 5' 3"$  (1.6 m), weight 135 lb (61.2 kg), last menstrual period 05/11/2010, SpO2 100 %. Body mass index is 23.91 kg/m.  General: Cooperative, alert, well developed, in no acute distress. HEENT: Conjunctivae and lids unremarkable. Cardiovascular: Regular  rhythm.  Lungs: Normal work of breathing. Neurologic: No focal deficits.   Lab Results  Component Value Date   CREATININE 1.17 (H) 10/14/2021   BUN 16 10/14/2021   NA 133 (L) 10/14/2021   K 4.2  10/14/2021   CL 99 10/14/2021   CO2 22 10/14/2021   Lab Results  Component Value Date   ALT 13 10/14/2021   AST 14 10/14/2021   ALKPHOS 85 05/27/2016   BILITOT 0.5 10/14/2021   Lab Results  Component Value Date   HGBA1C 7.0 (A) 10/22/2021   HGBA1C 7.6 (A) 08/20/2021   HGBA1C 7.0 (A) 02/05/2021   HGBA1C 7.6 (A) 10/01/2020   HGBA1C 7.5 (H) 05/16/2020   Lab Results  Component Value Date   INSULIN 8.1 11/16/2017   Lab Results  Component Value Date   TSH 2.36 10/14/2021   Lab Results  Component Value Date   CHOL 210 (H) 05/16/2020   HDL 65 05/16/2020   LDLCALC 127 (H) 05/16/2020   TRIG 84 05/16/2020   CHOLHDL 3.2 05/16/2020   Lab Results  Component Value Date   VD25OH 36 10/14/2021   VD25OH 45 02/05/2021   VD25OH 71 03/27/2019   Lab Results  Component Value Date   WBC 5.7 10/14/2021   HGB 11.4 (L) 10/14/2021   HCT 34.3 (L) 10/14/2021   MCV 80.0 10/14/2021   PLT 340 10/14/2021   Lab Results  Component Value Date   IRON 70 10/14/2021   TIBC 357 04/14/2012    Attestation Statements:   Reviewed by clinician on day of visit: allergies, medications, problem list, medical history, surgical history, family history, social history, and previous encounter notes.  I, Georgianne Fick, FNP, am acting as transcriptionist for Dr. Loyal Gambler.  I have reviewed the above documentation for accuracy and completeness, and I agree with the above. Collene Leyden Shirlie Enck, DO    I have reviewed the above documentation for accuracy and completeness, and I agree with the above. Dell Ponto, DO

## 2022-03-24 ENCOUNTER — Other Ambulatory Visit (HOSPITAL_COMMUNITY): Payer: Self-pay

## 2022-04-01 DIAGNOSIS — F39 Unspecified mood [affective] disorder: Secondary | ICD-10-CM | POA: Diagnosis not present

## 2022-04-15 ENCOUNTER — Ambulatory Visit (INDEPENDENT_AMBULATORY_CARE_PROVIDER_SITE_OTHER): Payer: 59 | Admitting: Family Medicine

## 2022-04-20 ENCOUNTER — Ambulatory Visit (INDEPENDENT_AMBULATORY_CARE_PROVIDER_SITE_OTHER): Payer: 59 | Admitting: Family Medicine

## 2022-04-29 DIAGNOSIS — F39 Unspecified mood [affective] disorder: Secondary | ICD-10-CM | POA: Diagnosis not present

## 2022-05-01 ENCOUNTER — Other Ambulatory Visit (HOSPITAL_COMMUNITY): Payer: Self-pay

## 2022-05-27 DIAGNOSIS — F39 Unspecified mood [affective] disorder: Secondary | ICD-10-CM | POA: Diagnosis not present

## 2022-06-02 ENCOUNTER — Other Ambulatory Visit (HOSPITAL_COMMUNITY): Payer: Self-pay

## 2022-06-02 ENCOUNTER — Telehealth (INDEPENDENT_AMBULATORY_CARE_PROVIDER_SITE_OTHER): Payer: 59 | Admitting: Family Medicine

## 2022-06-02 ENCOUNTER — Encounter (INDEPENDENT_AMBULATORY_CARE_PROVIDER_SITE_OTHER): Payer: Self-pay | Admitting: Family Medicine

## 2022-06-02 DIAGNOSIS — Z91148 Patient's other noncompliance with medication regimen for other reason: Secondary | ICD-10-CM

## 2022-06-02 DIAGNOSIS — N1831 Chronic kidney disease, stage 3a: Secondary | ICD-10-CM

## 2022-06-02 DIAGNOSIS — E669 Obesity, unspecified: Secondary | ICD-10-CM | POA: Diagnosis not present

## 2022-06-02 DIAGNOSIS — Z6823 Body mass index (BMI) 23.0-23.9, adult: Secondary | ICD-10-CM

## 2022-06-02 DIAGNOSIS — Z7985 Long-term (current) use of injectable non-insulin antidiabetic drugs: Secondary | ICD-10-CM

## 2022-06-02 DIAGNOSIS — E1165 Type 2 diabetes mellitus with hyperglycemia: Secondary | ICD-10-CM

## 2022-06-02 MED ORDER — SEMAGLUTIDE (2 MG/DOSE) 8 MG/3ML ~~LOC~~ SOPN
2.0000 mg | PEN_INJECTOR | SUBCUTANEOUS | 1 refills | Status: DC
  Filled 2022-06-02: qty 3, 28d supply, fill #0
  Filled 2022-07-07: qty 3, 28d supply, fill #1

## 2022-06-02 NOTE — Progress Notes (Signed)
TeleHealth Visit:  This visit was completed with telemedicine (audio/video) technology. Alexa [name] has verbally consented to this TeleHealth visit. The patient is located at home, the provider is located at home. The participants in this visit include the listed provider and patient. The visit was conducted today via MyChart video.  OBESITY Alexa [name] is here to discuss her progress with her obesity treatment plan along with follow-up of her obesity related diagnoses.   Today's visit was # 37 Starting weight: 169 lbs Starting date: 11/16/17 Weight at last in office visit: 135 lbs on 02/23/22 Total weight loss: 34 lbs at last in office visit on 02/23/22. Today's reported weight (06/02/22):  135 lbs  alking for 30 minutes 3 times per week.   Today's visit was #: 36 Starting weight: 169 lbs Starting date: 11/16/2017 Today's weight: 135 lbs Today's date: 02/23/22 Total lbs lost to date: 34 lbs Total lbs lost since last in-office visit: -1  Nutrition Plan: practicing portion control and making smarter food choices, such as increasing vegetables and decreasing simple carbohydrates   Current exercise:  none  Interim History:  Weight has been stable. She is at her goal weight.  Meets protein goal 2/3 of the time. She tries to get in 80 gms daily. Drinks mini can of Coke a 3-4 days per week. Drinks very lightly sweetened tea and water. Has been eating more salads. She doesn't eat/drink much dairy. She is not eating much candy. Previously she ate quite a bit of candy and drank a lot of soda.    Assessment/Plan:  1. Type 2 Diabetes Mellitus with hyperglycemia, without long-term current use of insulin HgbA1c is slightly above goal.  Last A1c was 7.0.  Diabetes managed by Dr. Fransico Michael.  Last A1c was in September 2023 and was 7.0.  Medication(s): Ozempic 2 mg SQ weekly.  Denies side effects. Prescribed metformin but is noncompliant with it.  Lab Results  Component Value Date   HGBA1C 7.0  (A) 10/22/2021   HGBA1C 7.6 (A) 08/20/2021   HGBA1C 7.0 (A) 02/05/2021   Lab Results  Component Value Date   MICROALBUR 3.6 11/13/2021   LDLCALC 127 (H) 05/16/2020   CREATININE 1.17 (H) 10/14/2021   No results found for: "GFR"  Plan: Continue and refill Ozempic 2 mg SQ weekly   2.  Noncompliance with medication regimen She is not compliant with all medications.  This has been a longstanding issue for her which she says is rooted in psychological problems.  She talks with her counselor about these issues. .  Medication she is not compliant with include levothyroxine, blood pressure medications, metformin, Cymbalta and a few more.  Plan:  I encouraged better compliance with her medicines She will continue to work on this with her counselor.  3. Generalized Obesity: Current BMI 23  Alexa [name] is currently in the action stage of change. As such, her goal is to maintain weight for now.  She has agreed to practicing portion control and making smarter food choices, such as increasing vegetables and decreasing simple carbohydrates.  With a goal of 80 g of protein daily  Exercise goals: Encouraged her to walk 2 to 3 days/week for exercise and do resistance training 2 to 3 days a week.  Behavioral modification strategies: increasing lean protein intake, decrease liquid calories, and decrease junk food.  Alexa [name] has agreed to follow-up with our clinic in 8 weeks.   No orders of the defined types were placed in this encounter.   Medications Discontinued During This Encounter  Medication Reason   Semaglutide, 2 MG/DOSE, 8 MG/3ML SOPN Reorder     Meds ordered this encounter  Medications   Semaglutide, 2 MG/DOSE, 8 MG/3ML SOPN    Sig: Inject 2 mg as directed once a week.    Dispense:  3 mL    Refill:  1    Order Specific Question:   Supervising Provider    Answer:   Glennis Brink [[id number]]      Objective:   VITALS: Per patient if applicable, see vitals. GENERAL: Alert and in no acute  distress. CARDIOPULMONARY: No increased WOB. Speaking in clear sentences.  PSYCH: Pleasant and cooperative. Speech normal rate and rhythm. Affect is appropriate. Insight and judgement are appropriate. Attention is focused, linear, and appropriate.  NEURO: Oriented as arrived to appointment on time with no prompting.   Attestation Statements:   Reviewed by clinician on day of visit: allergies, medications, problem list, medical history, surgical history, family history, social history, and previous encounter notes.  This was prepared with the assistance of Engineer, civil (consulting).  Occasional wrong-word or sound-a-like substitutions may have occurred due to the inherent limitations of voice recognition software.

## 2022-06-04 ENCOUNTER — Ambulatory Visit (INDEPENDENT_AMBULATORY_CARE_PROVIDER_SITE_OTHER): Payer: 59 | Admitting: Family Medicine

## 2022-06-11 ENCOUNTER — Other Ambulatory Visit (HOSPITAL_COMMUNITY): Payer: Self-pay

## 2022-06-24 DIAGNOSIS — F39 Unspecified mood [affective] disorder: Secondary | ICD-10-CM | POA: Diagnosis not present

## 2022-07-01 ENCOUNTER — Telehealth: Payer: Self-pay | Admitting: Internal Medicine

## 2022-07-01 ENCOUNTER — Other Ambulatory Visit: Payer: Self-pay | Admitting: Internal Medicine

## 2022-07-01 DIAGNOSIS — Z1231 Encounter for screening mammogram for malignant neoplasm of breast: Secondary | ICD-10-CM

## 2022-07-01 NOTE — Telephone Encounter (Signed)
Alexa [name] [phone]  Alexa [name] called to say she called to schedule her mammogram and she told them her left breast has been hurting for the last week or so they told her she would need to call her doctor. She stated that he has been hurting and there is also a lump on the underneath side. I told her she would need to come in and see you. She is working Thursday, Monday and Tuesday.

## 2022-07-03 NOTE — Telephone Encounter (Signed)
Scheduled

## 2022-07-07 ENCOUNTER — Other Ambulatory Visit (HOSPITAL_COMMUNITY): Payer: Self-pay

## 2022-07-07 NOTE — Progress Notes (Signed)
Patient Care Team: Margaree Mackintosh, MD as PCP - General (Internal Medicine) Corky Crafts, MD as PCP - Cardiology (Cardiology)  Visit Date: 07/14/22  Subjective:    Patient ID: Alexa [name] , Female   DOB: 1960-09-19, 62 y.o.    MRN: [id number]   62 y.o. Female presents today for bump under left breast and associated pain that has been present for 2-3 years. Has not had recent mammogram.   Has not had recent A1c. Seen at [facility] and has not established with a new endocrinologist since Dr. Fransico Michael left.   Past Medical History:  Diagnosis Date   Allergy    Anemia, iron deficiency    Anhedonia    Anxiety    Asthma    Asthma, chronic    Back pain    CAD (coronary artery disease)    Cervical spondylosis 06/13/2020   Chewing difficulty    Chronic insomnia    Chronic kidney disease    CKD stage 3 A    Combined hyperlipidemia    Constipation    Depression    Diabetes mellitus    Diabetes mellitus type II    Diabetic autonomic neuropathy (HCC)    Difficulty swallowing pills    DM type 2 with diabetic peripheral neuropathy (HCC)    Dry mouth    Dyspepsia    Dyspnea    Elevated homocysteine    Essential tremor    Fatigue    Fatty liver    GERD (gastroesophageal reflux disease)    GERD (gastroesophageal reflux disease)    Glossitis    Goiter    Hemorrhoids    HLD (hyperlipidemia)    Hyperparathyroidism , secondary, non-renal (HCC)    Hypertension    Hypocalcemia    Insomnia    Leg edema    Leg pain    Macular degeneration    Nonproliferative diabetic retinopathy associated with type 2 diabetes mellitus (HCC)    RESOLVED   Obesity, Class III, BMI 40-49.9 (morbid obesity) (HCC)    Osteopenia    Osteoporosis    Pallor    PONV (postoperative nausea and vomiting)    Status post gastric surgery    Tachycardia    Thyroiditis, autoimmune    Vaginal dryness, menopausal    Vitamin D deficiency      Family History  Problem Relation Age of Onset    Thyroid disease Mother        Hypothyroid   Depression Mother    Liver disease Mother    Obesity Mother    Diabetes Father        T2 DM   Hypertension Father    Hyperlipidemia Father    Heart disease Father    Stroke Father    Kidney Stones Father    Obesity Brother        250 pounds at a height of 76 inches.   Thyroid disease Sister        Hypothyroid   Diabetes Maternal Grandmother    Colon cancer Other        great grandfather   Liver disease Maternal Aunt        NASH   Esophageal cancer Neg Hx    Pancreatic cancer Neg Hx    Kidney disease Neg Hx    Colon polyps Neg Hx    Rectal cancer Neg Hx    Stomach cancer Neg Hx     Social History   Social History Narrative  Not on file      Review of Systems  Constitutional:  Negative for fever and malaise/fatigue.  HENT:  Negative for congestion.   Eyes:  Negative for blurred vision.  Respiratory:  Negative for cough and shortness of breath.   Cardiovascular:  Negative for chest pain, palpitations and leg swelling.  Gastrointestinal:  Negative for vomiting.  Musculoskeletal:  Negative for back pain.  Skin:  Negative for rash.       (+) Mass left breast  Neurological:  Negative for loss of consciousness and headaches.        Objective:   Vitals: BP 118/80   Pulse 81   Temp 99.1 F (37.3 C) (Tympanic)   Wt 138 lb (62.6 kg)   LMP 05/11/2010   SpO2 98%   BMI 24.45 kg/m    Physical Exam Vitals and nursing note reviewed.  Constitutional:      General: She is not in acute distress.    Appearance: Normal appearance. She is not toxic-appearing.  HENT:     Head: Normocephalic and atraumatic.  Pulmonary:     Effort: Pulmonary effort is normal.  Chest:     Comments: 3.5 cm diameter firm mass behind left areola. Skin:    General: Skin is warm and dry.  Neurological:     Mental Status: She is alert and oriented to person, place, and time. Mental status is at baseline.  Psychiatric:        Mood and  Affect: Mood normal.        Behavior: Behavior normal.        Thought Content: Thought content normal.        Judgment: Judgment normal.       Results:   Studies obtained and personally reviewed by me:    Labs:       Component Value Date/Time   NA 133 (L) 10/14/2021 1525   K 4.2 10/14/2021 1525   CL 99 10/14/2021 1525   CO2 22 10/14/2021 1525   GLUCOSE 98 10/14/2021 1525   BUN 16 10/14/2021 1525   CREATININE 1.17 (H) 10/14/2021 1525   CALCIUM 9.0 10/14/2021 1525   CALCIUM 9.0 10/14/2021 1525   CALCIUM 9.3 08/14/2011 1217   PROT 6.4 10/14/2021 1525   ALBUMIN 4.1 05/27/2016 0828   AST 14 10/14/2021 1525   ALT 13 10/14/2021 1525   ALKPHOS 85 05/27/2016 0828   BILITOT 0.5 10/14/2021 1525   GFRNONAA 43 (L) 09/16/2021 1153   GFRNONAA 59 (L) 05/16/2020 1050   GFRAA 69 05/16/2020 1050     Lab Results  Component Value Date   WBC 5.7 10/14/2021   HGB 11.4 (L) 10/14/2021   HCT 34.3 (L) 10/14/2021   MCV 80.0 10/14/2021   PLT 340 10/14/2021    Lab Results  Component Value Date   CHOL 210 (H) 05/16/2020   HDL 65 05/16/2020   LDLCALC 127 (H) 05/16/2020   TRIG 84 05/16/2020   CHOLHDL 3.2 05/16/2020    Lab Results  Component Value Date   HGBA1C 7.0 (A) 10/22/2021     Lab Results  Component Value Date   TSH 2.36 10/14/2021      Assessment & Plan:   Mass left breast: ordered diagnostic mammogram. May need echocardiogram. Referral to Dr. Rosalie Gums.  Type 2 diabetes mellitus: treated with metformin 500 mg twice daily with a meal. Ordered A1c.  Hemoglobin A1c is 7.4%.  Needs endocrinology follow-up.  Vitamin D deficiency-vitamin D level is low at 26.5.  Needs to take supplement.  Order 50,000 units weekly.  Elevated serum creatinine at 1.32-needs to be repeated when well-hydrated.    I,Alexander Ruley,acting as a Neurosurgeon for Margaree Mackintosh, MD.,have documented all relevant documentation on the behalf of Margaree Mackintosh, MD,as directed by  Margaree Mackintosh, MD while  in the presence of Margaree Mackintosh, MD.   I, Margaree Mackintosh, MD, have reviewed all documentation for this visit. The documentation on 07/25/22 for the exam, diagnosis, procedures, and orders are all accurate and complete.

## 2022-07-14 ENCOUNTER — Encounter: Payer: Self-pay | Admitting: Internal Medicine

## 2022-07-14 ENCOUNTER — Ambulatory Visit: Payer: 59 | Admitting: Internal Medicine

## 2022-07-14 VITALS — BP 118/80 | HR 81 | Temp 99.1°F | Wt 138.0 lb

## 2022-07-14 DIAGNOSIS — N644 Mastodynia: Secondary | ICD-10-CM | POA: Diagnosis not present

## 2022-07-14 DIAGNOSIS — E559 Vitamin D deficiency, unspecified: Secondary | ICD-10-CM

## 2022-07-14 DIAGNOSIS — E119 Type 2 diabetes mellitus without complications: Secondary | ICD-10-CM

## 2022-07-14 DIAGNOSIS — Z794 Long term (current) use of insulin: Secondary | ICD-10-CM

## 2022-07-14 DIAGNOSIS — R7989 Other specified abnormal findings of blood chemistry: Secondary | ICD-10-CM | POA: Diagnosis not present

## 2022-07-14 DIAGNOSIS — N6342 Unspecified lump in left breast, subareolar: Secondary | ICD-10-CM | POA: Diagnosis not present

## 2022-07-15 ENCOUNTER — Other Ambulatory Visit: Payer: Self-pay | Admitting: Internal Medicine

## 2022-07-15 DIAGNOSIS — N644 Mastodynia: Secondary | ICD-10-CM

## 2022-07-15 LAB — HEMOGLOBIN A1C
Hgb A1c MFr Bld: 7.4 % of total Hgb — ABNORMAL HIGH (ref <5.7)
Mean Plasma Glucose: 166 mg/dL
eAG (mmol/L): 9.2 mmol/L

## 2022-07-15 LAB — MICROALBUMIN / CREATININE URINE RATIO
Creatinine, Urine: 136 mg/dL (ref 20–275)
Microalb, Ur: <0.2 mg/dL

## 2022-07-22 ENCOUNTER — Ambulatory Visit
Admission: RE | Admit: 2022-07-22 | Discharge: 2022-07-22 | Disposition: A | Discharge status: Home / Self Care | Payer: 59 | Source: Ambulatory Visit | Attending: Internal Medicine | Admitting: Internal Medicine

## 2022-07-22 ENCOUNTER — Encounter (INDEPENDENT_AMBULATORY_CARE_PROVIDER_SITE_OTHER): Payer: Self-pay | Admitting: Family Medicine

## 2022-07-22 ENCOUNTER — Ambulatory Visit: Payer: Commercial Managed Care - PPO

## 2022-07-22 ENCOUNTER — Other Ambulatory Visit (HOSPITAL_COMMUNITY): Payer: Self-pay

## 2022-07-22 ENCOUNTER — Ambulatory Visit (INDEPENDENT_AMBULATORY_CARE_PROVIDER_SITE_OTHER): Payer: 59 | Admitting: Family Medicine

## 2022-07-22 ENCOUNTER — Other Ambulatory Visit: Payer: Commercial Managed Care - PPO

## 2022-07-22 VITALS — BP 149/93 | HR 80 | Temp 98.1°F | Ht 63.0 in | Wt 134.0 lb

## 2022-07-22 DIAGNOSIS — N1831 Chronic kidney disease, stage 3a: Secondary | ICD-10-CM | POA: Diagnosis not present

## 2022-07-22 DIAGNOSIS — E559 Vitamin D deficiency, unspecified: Secondary | ICD-10-CM | POA: Diagnosis not present

## 2022-07-22 DIAGNOSIS — Z7985 Long-term (current) use of injectable non-insulin antidiabetic drugs: Secondary | ICD-10-CM | POA: Diagnosis not present

## 2022-07-22 DIAGNOSIS — E669 Obesity, unspecified: Secondary | ICD-10-CM

## 2022-07-22 DIAGNOSIS — N644 Mastodynia: Secondary | ICD-10-CM | POA: Diagnosis not present

## 2022-07-22 DIAGNOSIS — E1122 Type 2 diabetes mellitus with diabetic chronic kidney disease: Secondary | ICD-10-CM | POA: Diagnosis not present

## 2022-07-22 DIAGNOSIS — E063 Autoimmune thyroiditis: Secondary | ICD-10-CM | POA: Diagnosis not present

## 2022-07-22 DIAGNOSIS — Z6823 Body mass index (BMI) 23.0-23.9, adult: Secondary | ICD-10-CM

## 2022-07-22 DIAGNOSIS — Z794 Long term (current) use of insulin: Secondary | ICD-10-CM | POA: Diagnosis not present

## 2022-07-22 MED ORDER — SEMAGLUTIDE (2 MG/DOSE) 8 MG/3ML ~~LOC~~ SOPN
2.0000 mg | PEN_INJECTOR | SUBCUTANEOUS | 0 refills | Status: DC
  Filled 2022-07-22 – 2022-08-05 (×2): qty 9, 84d supply, fill #0

## 2022-07-22 MED ORDER — VITAMIN D (ERGOCALCIFEROL) 1.25 MG (50000 UNIT) PO CAPS
50000.0000 [IU] | ORAL_CAPSULE | ORAL | 1 refills | Status: DC
  Filled 2022-07-22: qty 4, 28d supply, fill #0

## 2022-07-22 NOTE — Assessment & Plan Note (Signed)
Lab Results  Component Value Date   HGBA1C 7.4 (H) 07/14/2022   She is doing well on Ozempic 2 mg once weekly injection with improved satiety and a reduction of food cravings.  Denies nausea, heartburn or constipation.  She is not routinely checking blood glucose readings or taking metformin 500 mg bid.  Denies feeling hypoglycemic.  She has successfully lost and kept off  35 lb in the past  4.5 years of medically supervised weight management.  This is a 20.7% TBW loss.  She has reduced her intake of sugar and has done some walking with room to increase exercise.  We discussed establishing care with a new endocrinologist since her previous doctor retired.  She will contact Dr Sharl Ma for an appointment.  Will continue Ozempic 2 mg once weekly injection.  Update CMP today.  Check AM fasting glucose readings.  Reduce regular soda to zero.  Track steps and add in resistance training 2 x a week.

## 2022-07-22 NOTE — Progress Notes (Signed)
Office: [phone]  /  Fax: [phone]  WEIGHT SUMMARY AND BIOMETRICS  Starting Date: 11/16/17  Starting Weight: 169lb   Weight Lost Since Last Visit: 1lb   Vitals Temp: 98.1 F (36.7 C) BP: (!) 149/93 Pulse Rate: 80 SpO2: 100 %   Body Composition  Body Fat %: 36.1 % Fat Mass (lbs): 48.6 lbs Muscle Mass (lbs): 81.8 lbs Total Body Water (lbs): 59.4 lbs Visceral Fat Rating : 8     HPI  Chief Complaint: OBESITY  Alexa [name] is here to discuss her progress with her obesity treatment plan. She is on the practicing portion control and making smarter food choices, such as increasing vegetables and decreasing simple carbohydrates and states she is following her eating plan approximately 75 % of the time. She states she is walking 30 minutes 4 times per week.   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 Body mass index is 23.74 kg/m.  General: She is overweight, cooperative, alert, well developed, and in no acute distress. PSYCH: Has normal mood, affect and thought process.   Lungs: Normal  breathing effort, no conversational dyspnea.   ASSESSMENT AND PLAN  TREATMENT PLAN FOR OBESITY:  Recommended Dietary Goals  Alexa [name] is currently in the action stage of change. As such, her goal is to continue weight management plan. She has agreed to practicing portion control and making smarter food choices, such as increasing vegetables and decreasing simple carbohydrates.  Behavioral Intervention  We discussed the following Behavioral Modification Strategies today: increasing lean protein intake, decreasing simple carbohydrates , increasing vegetables, increasing lower glycemic fruits, increasing fiber rich foods, avoiding skipping meals, increasing water intake, continue to practice mindfulness when eating, and planning for success.  Additional resources provided today: NA  Recommended Physical Activity Goals  Alexa [name] has been advised to work up to 150 minutes of moderate intensity aerobic activity a week and strengthening exercises 2-3 times per week for cardiovascular health, weight loss maintenance and preservation of muscle mass.   She has agreed to [employer] strengthening exercises with a goal of 2-3 sessions a week  and Increase the intensity, frequency or duration of aerobic exercises    Pharmacotherapy changes for the treatment of obesity:   ASSOCIATED CONDITIONS ADDRESSED TODAY  Type 2 diabetes mellitus with stage 3a chronic kidney disease, without long-term current use of insulin (HCC) -     Semaglutide (2 MG/DOSE); Inject 2 mg as directed once a week.  Dispense: 9 mL; Refill: 0 -  Comprehensive metabolic panel  Vitamin D deficiency Assessment & Plan: Last vitamin D Lab Results  Component Value Date   VD25OH 36 10/14/2021   She has done well on RX ergocalciferol weekly.  Energy level has improved.  She has not consistently taken this.  Recheck level today Encouraged improved compliance taking this weekly  Orders: -     Vitamin D (Ergocalciferol); Take 1 capsule  (50,000 Units total) by mouth every Sunday.  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  Denies nausea, heartburn or constipation.  She is not routinely checking blood glucose readings or taking metformin 500 mg bid.  Denies feeling hypoglycemic.  She has successfully lost and kept off  35 lb in the past  4.5 years of medically supervised weight management.  This is a 20.7% TBW loss.  She has reduced her intake of sugar and has done some walking with room to increase exercise.  We discussed establishing care with a new endocrinologist since her previous doctor retired.  She will contact Alexa [name] for an appointment.  Will continue Ozempic 2 mg once weekly injection.  Update CMP today.  Check AM fasting glucose readings.  Reduce regular soda to zero.  Track steps and add in resistance training 2 x a week.       She was informed of the importance of frequent follow up visits to maximize her success with intensive lifestyle modifications for her multiple health conditions.   ATTESTASTION STATEMENTS:  Reviewed by clinician on day of visit: allergies, medications, problem list, medical history, surgical history, family history, social history, and previous encounter notes pertinent to obesity diagnosis.   I have personally spent 30 minutes total time today in preparation, patient care, nutritional counseling and documentation for this visit, including the following: review of clinical lab tests; review of medical  tests/procedures/services.      Alexa Brink, DO DABFM, DABOM [facility] [address] [place], [place] [address] [phone]

## 2022-07-22 NOTE — Assessment & Plan Note (Signed)
Last vitamin D Lab Results  Component Value Date   VD25OH 36 10/14/2021   She has done well on RX ergocalciferol weekly.  Energy level has improved.  She has not consistently taken this.  Recheck level today Encouraged improved compliance taking this weekly

## 2022-07-22 NOTE — Assessment & Plan Note (Deleted)
Lab Results  Component Value Date   HGBA1C 7.4 (H) 07/14/2022   She is doing well on Ozempic 2 mg once weekly injection with improved satiety and a reduction of food cravings.  Denies nausea, heartburn or constipation.  She is not routinely checking blood glucose readings or taking metformin 500 mg bid.  Denies feeling hypoglycemic.  She has successfully lost and kept off  35 lb in the past  4.5 years of medically supervised weight management.  This is a 20.7% TBW loss.  She has reduced her intake of sugar and has done some walking with room to increase exercise.  We discussed establishing care with a new endocrinologist since her previous doctor retired.  She will contact Dr Kerr for an appointment.  Will continue Ozempic 2 mg once weekly injection.  Update CMP today.  Check AM fasting glucose readings.  Reduce regular soda to zero.  Track steps and add in resistance training 2 x a week. 

## 2022-07-23 LAB — COMPREHENSIVE METABOLIC PANEL
ALT: 20 IU/L (ref 0–32)
AST: 19 IU/L (ref 0–40)
Albumin: 4.3 g/dL (ref 3.9–4.9)
Alkaline Phosphatase: 103 IU/L (ref 44–121)
BUN/Creatinine Ratio: 14 (ref 12–28)
BUN: 19 mg/dL (ref 8–27)
Bilirubin Total: 0.3 mg/dL (ref 0.0–1.2)
CO2: 20 mmol/L (ref 20–29)
Calcium: 9 mg/dL (ref 8.7–10.3)
Chloride: 105 mmol/L (ref 96–106)
Creatinine, Ser: 1.32 mg/dL — ABNORMAL HIGH (ref 0.57–1.00)
Globulin, Total: 2.3 g/dL (ref 1.5–4.5)
Glucose: 88 mg/dL (ref 70–99)
Potassium: 4.8 mmol/L (ref 3.5–5.2)
Sodium: 140 mmol/L (ref 134–144)
Total Protein: 6.6 g/dL (ref 6.0–8.5)
eGFR: 46 mL/min/{1.73_m2} — ABNORMAL LOW (ref >59)

## 2022-07-23 LAB — TSH RFX ON ABNORMAL TO FREE T4: TSH: 2.5 u[IU]/mL (ref 0.450–4.500)

## 2022-07-23 LAB — VITAMIN D 25 HYDROXY (VIT D DEFICIENCY, FRACTURES): Vit D, 25-Hydroxy: 26.5 ng/mL — ABNORMAL LOW (ref 30.0–100.0)

## 2022-07-24 NOTE — Progress Notes (Signed)
Results have been relayed to the patient/authorized caretaker. The patient/authorized caretaker verbalized understanding. No questions at this time.   

## 2022-07-25 NOTE — Patient Instructions (Addendum)
Rustic mammogram ordered left breast to evaluate mass behind areola.  Ordered hemoglobin A1c to follow-up on type 2 diabetes mellitus currently just on metformin.  Basic metabolic panel needs to be repeated when well-hydrated his creatinine was elevated with this visit at 1.32.  Need to rule out chronic kidney disease with diabetes.

## 2022-08-03 ENCOUNTER — Other Ambulatory Visit (HOSPITAL_COMMUNITY): Payer: Self-pay

## 2022-08-05 ENCOUNTER — Other Ambulatory Visit: Payer: Self-pay

## 2022-08-05 ENCOUNTER — Other Ambulatory Visit (HOSPITAL_COMMUNITY): Payer: Self-pay

## 2022-08-06 ENCOUNTER — Other Ambulatory Visit: Payer: 59

## 2022-08-06 DIAGNOSIS — R7989 Other specified abnormal findings of blood chemistry: Secondary | ICD-10-CM | POA: Diagnosis not present

## 2022-08-07 LAB — CREATININE, SERUM: Creat: 1.22 mg/dL — ABNORMAL HIGH (ref 0.50–1.05)

## 2022-08-07 NOTE — Addendum Note (Signed)
Addended by: Jama Flavors on: 08/07/2022 02:06 PM   Modules accepted: Orders

## 2022-08-11 ENCOUNTER — Ambulatory Visit: Payer: 59 | Admitting: Internal Medicine

## 2022-08-26 DIAGNOSIS — F39 Unspecified mood [affective] disorder: Secondary | ICD-10-CM | POA: Diagnosis not present

## 2022-08-27 IMAGING — US US RENAL
1 series · 14 of 25 positions shown · non-contrast
Comparison: Abdominal ultrasound 08/11/2006

CLINICAL DATA: CKD stage 3

EXAM:
RENAL / URINARY TRACT ULTRASOUND COMPLETE

[Series 1: us renal · 14 of 27 slices shown]
[im 1/27]
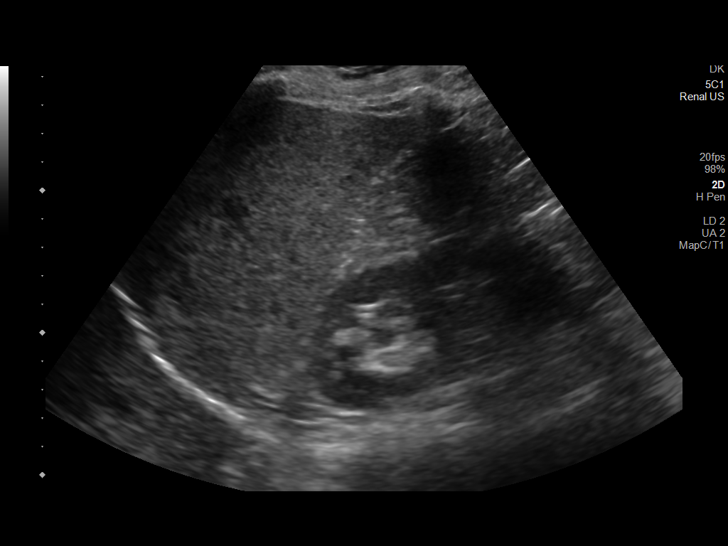
[im 3/27]
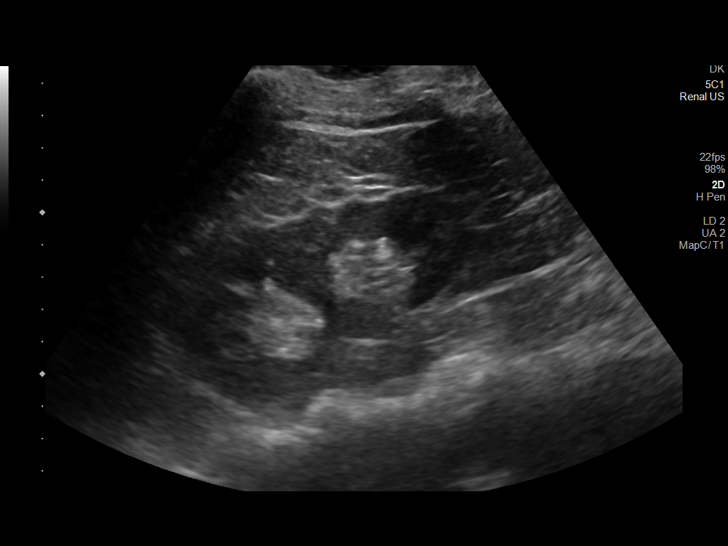
[im 5/27]
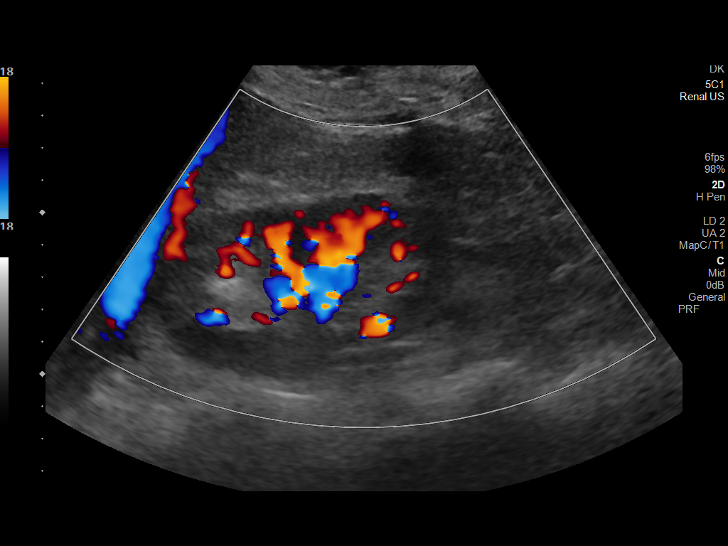
[im 7/27]
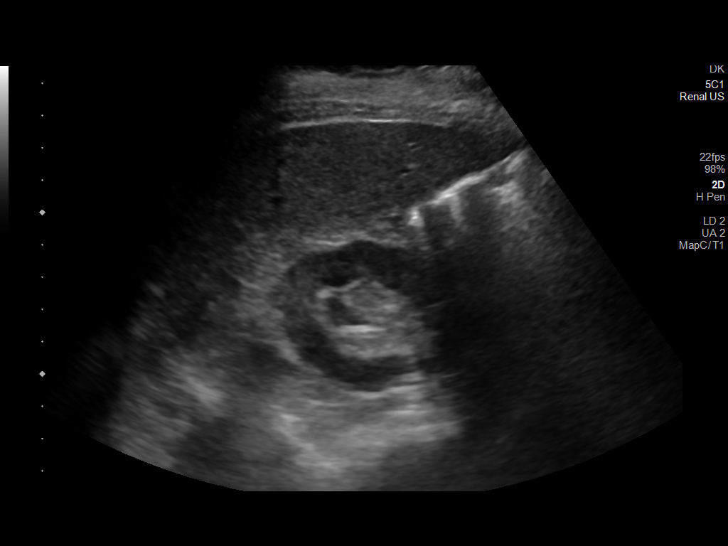
[im 9/27]
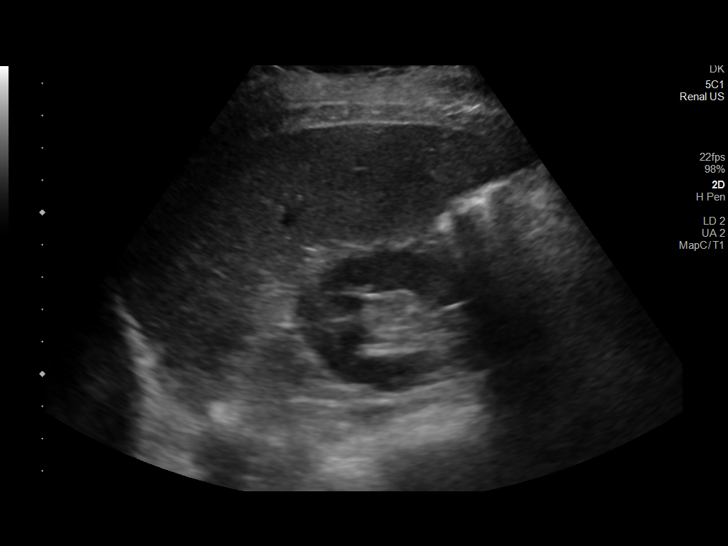
[im 10/27]
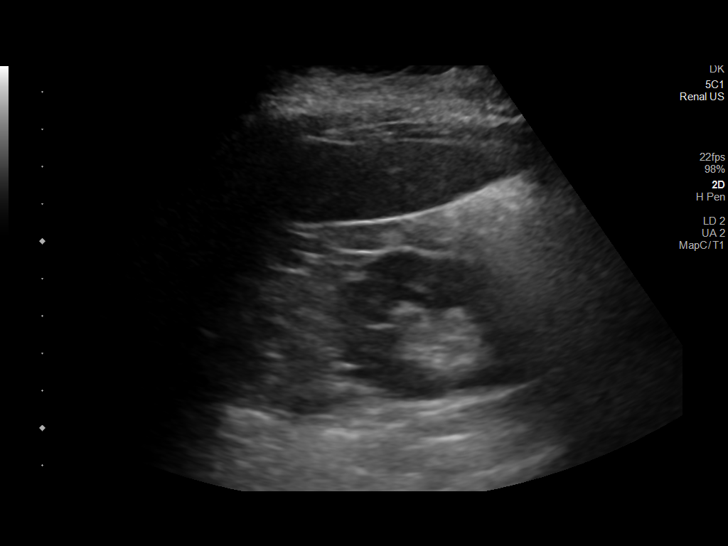
[im 12/27]
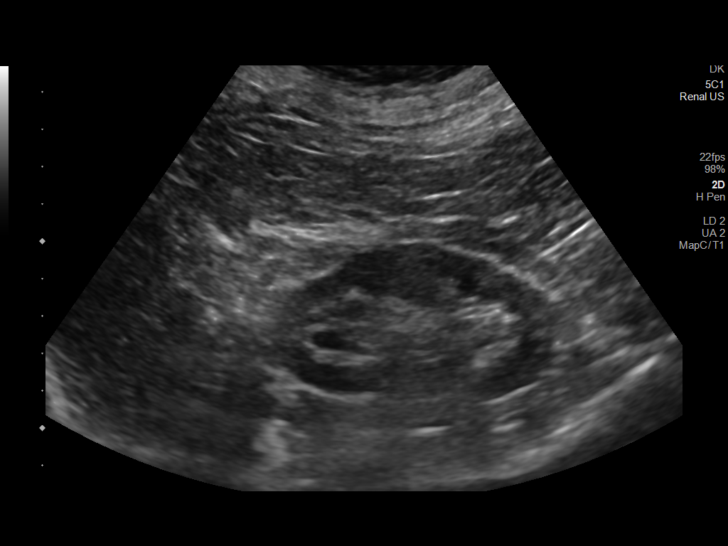
[im 15/27]
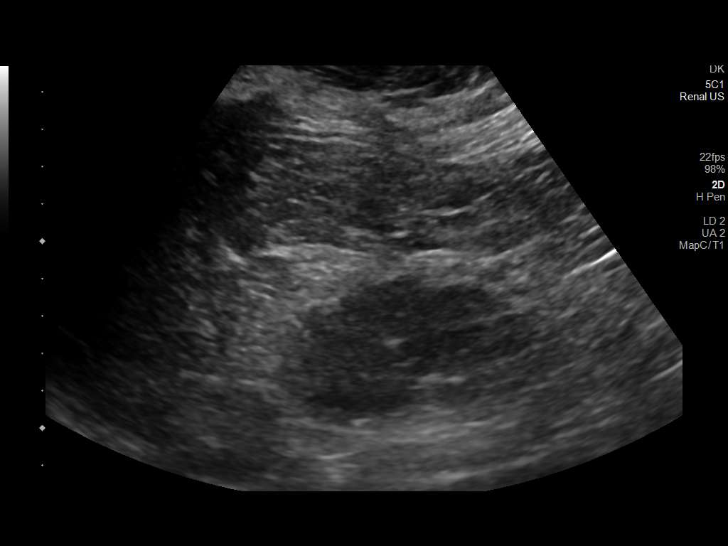
[im 17/27]
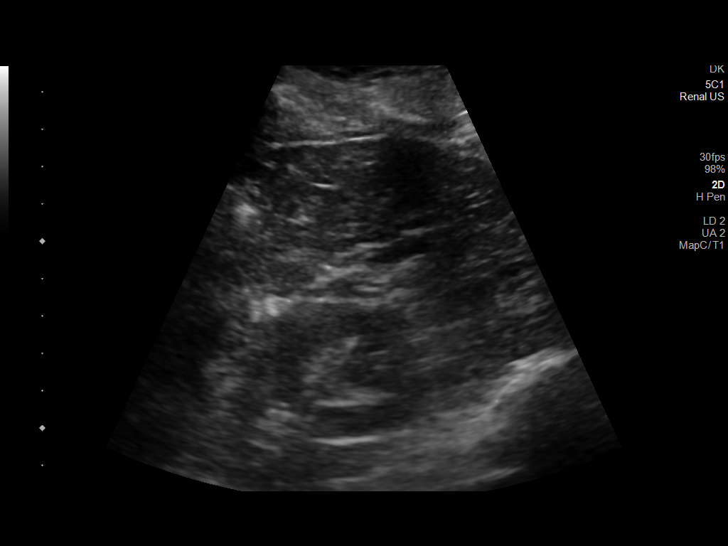
[im 18/27]
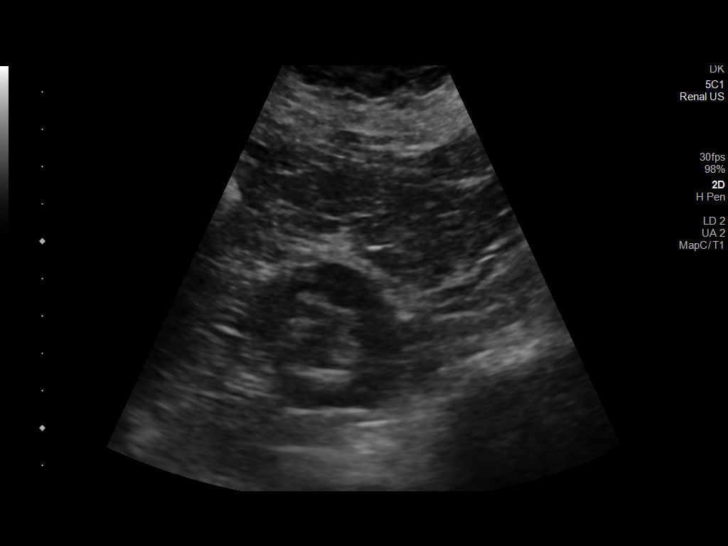
[im 20/27]
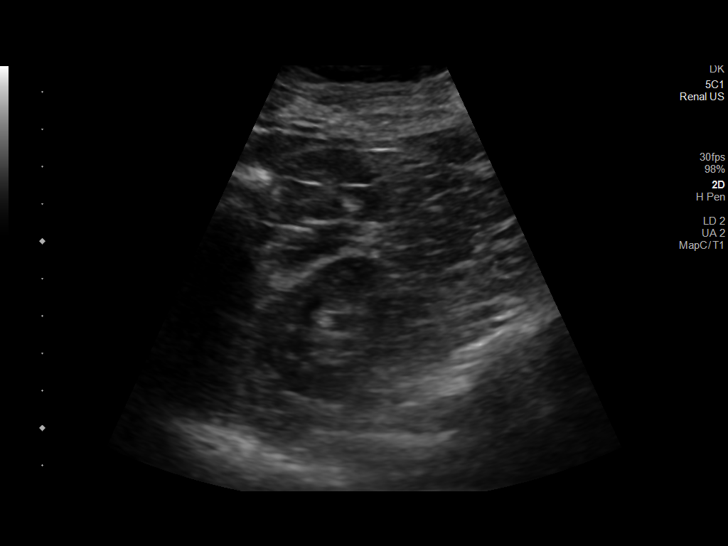
[im 22/27]
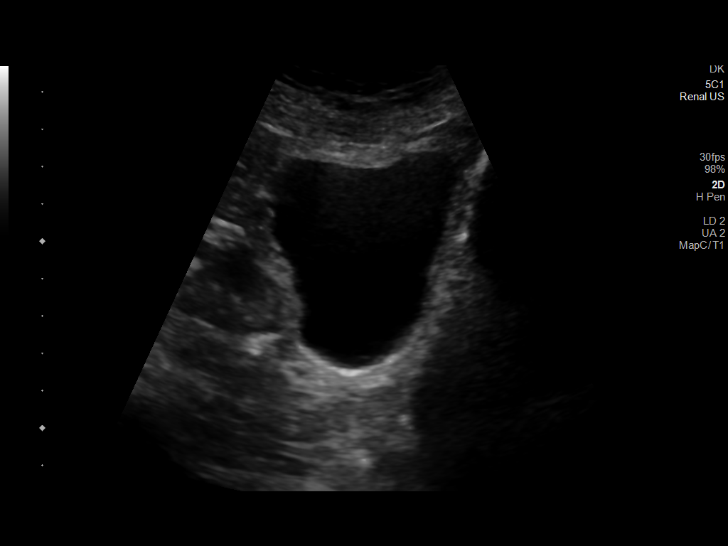
[im 24/27]
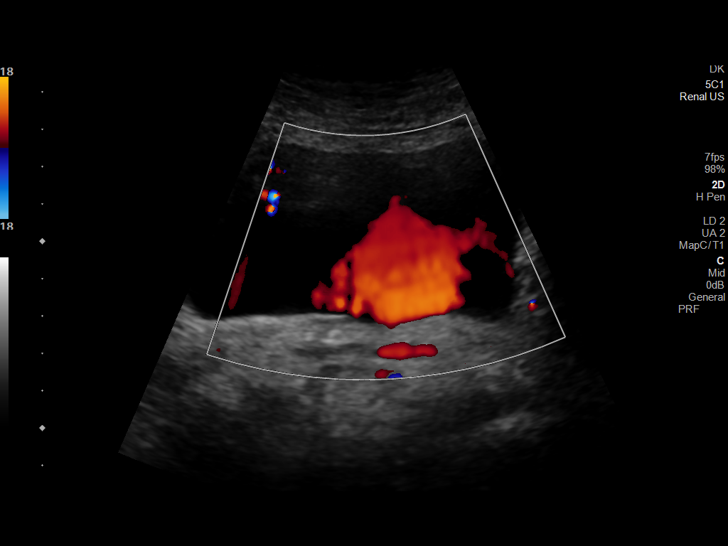
[im 27/27]
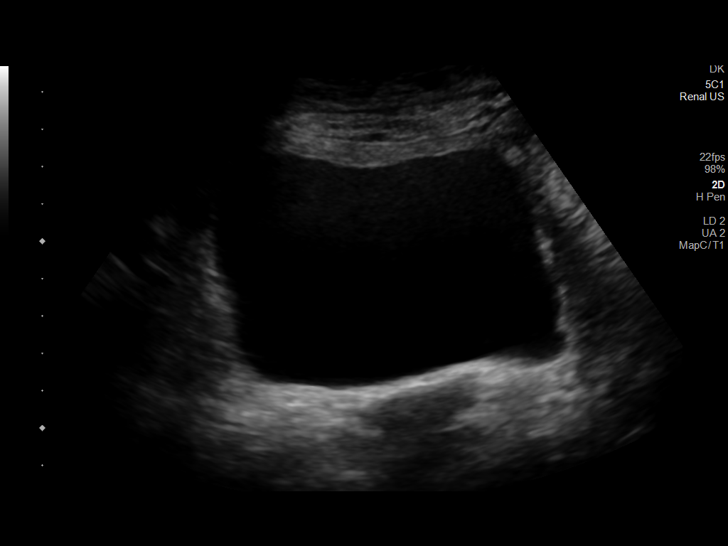

[14 of 25 positions shown; findings below may reference images not displayed]

FINDINGS: Right Kidney:

Renal measurements: 9.0 x 3.4 x 4.9 cm = volume: 86 mL. Echogenicity
within normal limits. No mass or hydronephrosis visualized.

Left Kidney:

Renal measurements: 7.7 x 4.1 x 3.8 cm = volume: 62 mL. Echogenicity
within normal limits. No mass or hydronephrosis visualized.

Bladder:

Appears normal for degree of bladder distention.

Other:

None.
IMPRESSION: Unremarkable sonographic appearance of the bilateral kidneys and
bladder.

## 2022-09-15 ENCOUNTER — Encounter: Payer: Self-pay | Admitting: Internal Medicine

## 2022-09-15 ENCOUNTER — Other Ambulatory Visit (HOSPITAL_COMMUNITY): Payer: Self-pay

## 2022-09-17 DIAGNOSIS — F39 Unspecified mood [affective] disorder: Secondary | ICD-10-CM | POA: Diagnosis not present

## 2022-09-23 ENCOUNTER — Ambulatory Visit
Admission: RE | Admit: 2022-09-23 | Discharge: 2022-09-23 | Disposition: A | Discharge status: Home / Self Care | Payer: 59 | Source: Ambulatory Visit | Attending: Internal Medicine | Admitting: Internal Medicine

## 2022-09-23 DIAGNOSIS — R7989 Other specified abnormal findings of blood chemistry: Secondary | ICD-10-CM

## 2022-09-23 NOTE — Progress Notes (Signed)
Patient notified

## 2022-10-15 ENCOUNTER — Other Ambulatory Visit (HOSPITAL_COMMUNITY): Payer: Self-pay

## 2022-10-15 ENCOUNTER — Encounter (INDEPENDENT_AMBULATORY_CARE_PROVIDER_SITE_OTHER): Payer: Self-pay | Admitting: Family Medicine

## 2022-10-15 ENCOUNTER — Ambulatory Visit (INDEPENDENT_AMBULATORY_CARE_PROVIDER_SITE_OTHER): Payer: 59 | Admitting: Family Medicine

## 2022-10-15 VITALS — BP 155/87 | HR 69 | Temp 98.2°F | Ht 63.0 in | Wt 134.0 lb

## 2022-10-15 DIAGNOSIS — E559 Vitamin D deficiency, unspecified: Secondary | ICD-10-CM

## 2022-10-15 DIAGNOSIS — Z7985 Long-term (current) use of injectable non-insulin antidiabetic drugs: Secondary | ICD-10-CM

## 2022-10-15 DIAGNOSIS — N1831 Chronic kidney disease, stage 3a: Secondary | ICD-10-CM | POA: Diagnosis not present

## 2022-10-15 DIAGNOSIS — F39 Unspecified mood [affective] disorder: Secondary | ICD-10-CM | POA: Diagnosis not present

## 2022-10-15 DIAGNOSIS — Z9884 Bariatric surgery status: Secondary | ICD-10-CM | POA: Diagnosis not present

## 2022-10-15 DIAGNOSIS — E669 Obesity, unspecified: Secondary | ICD-10-CM | POA: Diagnosis not present

## 2022-10-15 DIAGNOSIS — E1122 Type 2 diabetes mellitus with diabetic chronic kidney disease: Secondary | ICD-10-CM | POA: Diagnosis not present

## 2022-10-15 DIAGNOSIS — Z6823 Body mass index (BMI) 23.0-23.9, adult: Secondary | ICD-10-CM | POA: Diagnosis not present

## 2022-10-15 MED ORDER — TIRZEPATIDE 15 MG/0.5ML ~~LOC~~ SOAJ
15.0000 mg | SUBCUTANEOUS | 0 refills | Status: DC
  Filled 2022-10-15: qty 2, 28d supply, fill #0
  Filled 2022-11-16: qty 2, 28d supply, fill #1
  Filled 2022-12-30: qty 2, 28d supply, fill #2

## 2022-10-15 MED ORDER — VITAMIN D (ERGOCALCIFEROL) 1.25 MG (50000 UNIT) PO CAPS
50000.0000 [IU] | ORAL_CAPSULE | ORAL | 1 refills | Status: DC
  Filled 2022-10-15: qty 5, 35d supply, fill #0
  Filled 2022-12-30: qty 5, 35d supply, fill #1

## 2022-10-15 NOTE — Progress Notes (Signed)
.smr  Office: [phone]  /  Fax: [phone]  WEIGHT SUMMARY AND BIOMETRICS  Anthropometric Measurements Height: 5\' 3"  (1.6 m) Weight: 134 lb (60.8 kg) BMI (Calculated): 23.74 Weight at Last Visit: 134 lb Weight Lost Since Last Visit: 0 Weight Gained Since Last Visit: 0 Starting Weight: 169 lb Total Weight Loss (lbs): 35 lb (15.9 kg)   Body Composition  Body Fat %: 37.2 % Fat Mass (lbs): 49.8 lbs Muscle Mass (lbs): 79.8 lbs Total Body Water (lbs): 59.6 lbs Visceral Fat Rating : 8   Other Clinical Data Fasting: No Labs: No Today's Visit #: 26 Starting Date: 11/16/17    Chief Complaint: OBESITY   History of Present Illness   The patient, with a history of type 2 diabetes, obesity, and chronic renal insufficiency (Stage 3a), presents for a routine follow-up. Over the past three months, she has maintained her weight through portion control and regular exercise, walking 30-45 minutes three times a week. However, she has not been keeping a food journal. Despite being on Ozempic 2mg  per week, her most recent HbA1c was 7.4, indicating uncontrolled diabetes. Her last GFR was 46, consistent with her chronic renal insufficiency. She is also on Oscal with vitamin D, but her most recent vitamin D level was 26.5, suggesting inadequate control.  The patient reports a significant weight loss of approximately 60 pounds since initiating treatment. She has not experienced any problems with Ozempic. She has previously tried [place], which helped her achieve her current weight. She has not tried any other diabetes medications.  In addition to her physical health, the patient is also seeing a therapist, who is retiring at the end of the year. The patient is currently seeking a new therapist. She is planning a trip to [place] in a couple of weeks, which she is looking forward to.          PHYSICAL EXAM:  Blood pressure (!) 155/87, pulse 69, temperature 98.2 F (36.8 C), height 5\' 3"   (1.6 m), weight 134 lb (60.8 kg), last menstrual period 05/11/2010, SpO2 99%. Body mass index is 23.74 kg/m.  DIAGNOSTIC DATA REVIEWED:  BMET    Component Value Date/Time   NA 140 07/22/2022 1231   K 4.8 07/22/2022 1231   CL 105 07/22/2022 1231   CO2 20 07/22/2022 1231   GLUCOSE 88 07/22/2022 1231   GLUCOSE 98 10/14/2021 1525   BUN 19 07/22/2022 1231   CREATININE 1.22 (H) 08/06/2022 0933   CALCIUM 9.0 07/22/2022 1231   CALCIUM 9.3 08/14/2011 1217   GFRNONAA 43 (L) 09/16/2021 1153   GFRNONAA 59 (L) 05/16/2020 1050   GFRAA 69 05/16/2020 1050   Lab Results  Component Value Date   HGBA1C 7.4 (H) 07/14/2022   HGBA1C 6.6 06/09/2010   Lab Results  Component Value Date   INSULIN 8.1 11/16/2017   Lab Results  Component Value Date   TSH 2.500 07/22/2022   CBC    Component Value Date/Time   WBC 5.7 10/14/2021 1525   RBC 4.29 10/14/2021 1525   HGB 11.4 (L) 10/14/2021 1525   HCT 34.3 (L) 10/14/2021 1525   PLT 340 10/14/2021 1525   MCV 80.0 10/14/2021 1525   MCH 26.6 (L) 10/14/2021 1525   MCHC 33.2 10/14/2021 1525   RDW 14.4 10/14/2021 1525   Iron Studies    Component Value Date/Time   IRON 70 10/14/2021 1525   TIBC 357 04/14/2012 1059   IRONPCTSAT 57 (H) 04/14/2012 1059   Lipid Panel  Component Value Date/Time   CHOL 210 (H) 05/16/2020 1050   TRIG 84 05/16/2020 1050   HDL 65 05/16/2020 1050   CHOLHDL 3.2 05/16/2020 1050   VLDL 10 03/05/2017 1903   LDLCALC 127 (H) 05/16/2020 1050   Hepatic Function Panel     Component Value Date/Time   PROT 6.6 07/22/2022 1231   ALBUMIN 4.3 07/22/2022 1231   AST 19 07/22/2022 1231   ALT 20 07/22/2022 1231   ALKPHOS 103 07/22/2022 1231   BILITOT 0.3 07/22/2022 1231   BILIDIR 0.1 03/27/2019 1045   IBILI 0.3 03/27/2019 1045      Component Value Date/Time   TSH 2.500 07/22/2022 1231   TSH 2.36 10/14/2021 1525   Nutritional Lab Results  Component Value Date   VD25OH 26.5 (L) 07/22/2022   VD25OH 36 10/14/2021    VD25OH 45 02/05/2021     Assessment and Plan    Type 2 Diabetes Uncontrolled with recent HbA1c of 7.4. Patient is on Ozempic 2mg  weekly. Discussed switching to [facility] for better glucose control. -Switch from Ozempic to [facility] 15mg  weekly. -Check HbA1c in 8 weeks.  Obesity Patient has lost significant weight and is maintaining current weight. She is practicing portion control and walking for exercise 3 times a week. -Continue current lifestyle modifications. -Consider using ChatGPT for meal planning to ensure adequate protein intake.  Chronic Kidney Disease (Stage 3a) Recent GFR of 46. -Continue monitoring renal function. -importance of tight glucose control to prevent further kidney damage.  Vitamin D Deficiency Recent level of 26.5 despite supplementation with Oscal with vitamin D. -Add prescription vitamin D2. -Discontinue biotech vitamin D3. -Check vitamin D level in 8 weeks.  General Health Maintenance -Continue with planned visit to new endocrinologist in December. -Seek new therapist following current therapist's retirement.        She was informed of the importance of frequent follow up visits to maximize her success with intensive lifestyle modifications for her multiple health conditions.    Quillian Quince, MD

## 2022-10-20 ENCOUNTER — Other Ambulatory Visit (HOSPITAL_COMMUNITY): Payer: Self-pay

## 2022-11-09 ENCOUNTER — Other Ambulatory Visit: Payer: 59

## 2022-11-12 NOTE — Progress Notes (Signed)
Patient Care Team: Margaree Mackintosh, MD as PCP - General (Internal Medicine) Corky Crafts, MD as PCP - Cardiology (Cardiology)  Visit Date: 11/19/22  Subjective:    Patient ID: Alexa [name] , Female   DOB: [birth year], 62 y.o.    MRN: [id number]   62 y.o. Female presents today for annual comprehensive physical exam. History of asthma, hypertension, anxiety, major depressive disorder, Type 2 diabetes mellitus, hypothyroidism, hypertension, hyperlipidemia.  History of mild intermittent asthma. She has been having increased shortness of breath recently. This does not seem to be associated with activity level and happens at rest and during increased activity. Feels as though she cannot draw a full breath into lungs. Has an emergency albuterol inhaler that she has not needed to use. She has not had difficulty with asthma since first Covid-19 infection years ago. Has a history of anxiety induced chest pain. Chest tightness never fully resolved after first Covid-19 infection. She is currently having episodes of chest tightness twice monthly on average.   History of hyperlipidemia treated with rosuvastatin 5 mg daily. CHOL elevated at 232, LDL elevated at 134 on 11/16/22, up from 210 and 127 on 05/16/20.  History of recurrent low back pain.    Essential hypertension blood pressure stable on Coreg and Micardis. Blood pressure normal today at 120/80.   History of gastric bypass surgery.   Followed by endocrinology for Type 2 diabetes mellitus and hyperparathyroidism. Currently on Ozempic and will be switching to [facility] when that runs out. Taking metformin 500 mg twice daily. HGBA1c at 7.6% on 11/16/22, up from 7.4% on 07/14/22.  History of hypothyroidism that is stable with low-dose levothyroxine 25 mcg daily. TSH at 2.56.  History of Vitamin D deficiency and is on high dose Vitamin D.  Seen by Dr. Loreta Ave for onychomycosis.    History of anxiety and depression and PTSD. Currently on  Cymbalta per Dortha Schwalbe NP, Behavioral Health.   History of gastric bypass surgery in 2008.  She was able to stop all diabetic medicines after this surgery but then began to regain weight and Dr. Fransico Michael restarted her on metformin.  She quit taking it.  In September 2015 her hemoglobin A1c was 9.4%.  He stopped Byette and started her on  Victoza.  She takes B12 due to history of gastric bypass surgery.  She is dissatisfied with recent weight loss. She has lost 1 pound between 11/13/21 and today. She has been switched from Ozempic to [place]. Followed by Dr. Dalbert Garnet at [facility].  Her PTH levels have varied with Dr. Juluis Mire care between 79 and 167.  She saw Dr. Valentino Nose regarding elevated serum creatinine and was diagnosed as stage IIIa renal disease.  Renal ultrasound in August 2021 was unremarkable.  She says Dr. Valentino Nose did not feel she needed further follow-up with nephrology.  She saw Dr. Eldridge Dace in August 2021.  She had COVID-19 in February 2023.   History of left sciatica January 2020.  Had right sciatica in 2017.  Also had right sciatica in 2015 and left lumbar radiculopathy in 2019.  Generally prefers not to go to physical therapy but usually responds to medication and rest.  Her father had a stroke in late 2022.  She has a long history of obesity. She was heavy as a child. Had menarche at age 36 about age 81 she weighed 180 pounds. She was diagnosed with diabetes in 1999 and at that point her weight was 250 pounds.   Labs  reviewed today. Glucose elevated at 128. Creatinine elevated at 1.19, GFR low at 52. Electrolytes normal. Blood proteins normal. Liver functions normal. MCH low at 26.3.   Had normal Pap smear on 05/24/20.  07/22/22 mammogram showed bilateral changes from reduction mammoplasty, no suspicious mass or malignant type microcalcifications in either breast.  Had colonoscopy by Dr. Myrtie Neither in May 2023. Had precancerous polyps and he recommended repeat colonoscopy  in 3 years.   Social history: Married. Husband has heart failure. Father had stroke in 2022. 1 sister. No children.   Family history: Remarkable for diabetes mellitus in father and maternal grandmother.  Both mother and her sister with hypothyroidism.   Past Medical History:  Diagnosis Date   Allergy    Anemia, iron deficiency    Anhedonia    Anxiety    Asthma    Asthma, chronic    Back pain    CAD (coronary artery disease)    Cervical spondylosis 06/13/2020   Chewing difficulty    Chronic insomnia    Chronic kidney disease    CKD stage 3 A    Combined hyperlipidemia    Constipation    Depression    Diabetes mellitus    Diabetes mellitus type II    Diabetic autonomic neuropathy (HCC)    Difficulty swallowing pills    DM type 2 with diabetic peripheral neuropathy (HCC)    Dry mouth    Dyspepsia    Dyspnea    Elevated homocysteine    Essential tremor    Fatigue    Fatty liver    GERD (gastroesophageal reflux disease)    GERD (gastroesophageal reflux disease)    Glossitis    Goiter    Hemorrhoids    HLD (hyperlipidemia)    Hyperparathyroidism , secondary, non-renal (HCC)    Hypertension    Hypocalcemia    Insomnia    Leg edema    Leg pain    Macular degeneration    Nonproliferative diabetic retinopathy associated with type 2 diabetes mellitus (HCC)    RESOLVED   Obesity, Class III, BMI 40-49.9 (morbid obesity) (HCC)    Osteopenia    Osteoporosis    Pallor    PONV (postoperative nausea and vomiting)    Status post gastric surgery    Tachycardia    Thyroiditis, autoimmune    Vaginal dryness, menopausal    Vitamin D deficiency      Family History  Problem Relation Age of Onset   Thyroid disease Mother        Hypothyroid   Depression Mother    Liver disease Mother    Obesity Mother    Diabetes Father        T2 DM   Hypertension Father    Hyperlipidemia Father    Heart disease Father    Stroke Father    Kidney Stones Father    Obesity Brother         250 pounds at a height of 76 inches.   Thyroid disease Sister        Hypothyroid   Diabetes Maternal Grandmother    Colon cancer Other        great grandfather   Liver disease Maternal Aunt        NASH   Esophageal cancer Neg Hx    Pancreatic cancer Neg Hx    Kidney disease Neg Hx    Colon polyps Neg Hx    Rectal cancer Neg Hx    Stomach cancer Neg Hx  Social Hx: Married. Works as a Orthoptist at [employer]. Non-smoker. No alcohol consumption.     Review of Systems  Constitutional:  Negative for chills, fever, malaise/fatigue and weight loss.  HENT:  Negative for hearing loss, sinus pain and sore throat.   Respiratory:  Positive for shortness of breath. Negative for cough and hemoptysis.   Cardiovascular:  Positive for chest pain. Negative for palpitations, leg swelling and PND.  Gastrointestinal:  Negative for abdominal pain, constipation, diarrhea, heartburn, nausea and vomiting.  Genitourinary:  Negative for dysuria, frequency and urgency.  Musculoskeletal:  Negative for back pain, myalgias and neck pain.  Skin:  Negative for itching and rash.  Neurological:  Negative for dizziness, tingling, seizures and headaches.  Endo/Heme/Allergies:  Negative for polydipsia.  Psychiatric/Behavioral:  Negative for depression. The patient is not nervous/anxious.         Objective:   Vitals: BP 120/80   Pulse 88   Ht 5\' 3"  (1.6 m)   Wt 134 lb (60.8 kg)   LMP 05/11/2010   SpO2 98%   BMI 23.74 kg/m    Physical Exam Vitals and nursing note reviewed.  Constitutional:      General: She is not in acute distress.    Appearance: Normal appearance. She is not ill-appearing or toxic-appearing.  HENT:     Head: Normocephalic and atraumatic.     Right Ear: Hearing, tympanic membrane, ear canal and external ear normal.     Left Ear: Hearing, tympanic membrane, ear canal and external ear normal.     Mouth/Throat:     Pharynx: Oropharynx is clear.  Eyes:     Extraocular  Movements: Extraocular movements intact.     Pupils: Pupils are equal, round, and reactive to light.  Neck:     Thyroid: No thyroid mass, thyromegaly or thyroid tenderness.     Vascular: No carotid bruit.  Cardiovascular:     Rate and Rhythm: Normal rate and regular rhythm. No extrasystoles are present.    Heart sounds: Normal heart sounds. No murmur heard.    No friction rub. No gallop.  Pulmonary:     Effort: Pulmonary effort is normal.     Breath sounds: Normal breath sounds. No decreased breath sounds, wheezing, rhonchi or rales.  Chest:     Chest wall: No mass.     Comments: Fibrocystic changes left breast- unchanged. Abdominal:     Palpations: Abdomen is soft. There is no hepatomegaly, splenomegaly or mass.     Tenderness: There is no abdominal tenderness.     Hernia: No hernia is present.  Musculoskeletal:     Cervical back: Normal range of motion.     Right lower leg: No edema.     Left lower leg: No edema.  Feet:     Comments: Feet warm to touch bilaterally- no lesions or ulcerations. Lymphadenopathy:     Cervical: No cervical adenopathy.     Upper Body:     Right upper body: No supraclavicular adenopathy.     Left upper body: No supraclavicular adenopathy.  Skin:    General: Skin is warm and dry.  Neurological:     General: No focal deficit present.     Mental Status: She is alert and oriented to person, place, and time. Mental status is at baseline.     Sensory: Sensation is intact.     Motor: Motor function is intact. No weakness.     Deep Tendon Reflexes: Reflexes are normal and symmetric.  Psychiatric:  Attention and Perception: Attention normal.        Mood and Affect: Mood normal.        Speech: Speech normal.        Behavior: Behavior normal.        Thought Content: Thought content normal.        Cognition and Memory: Cognition normal.        Judgment: Judgment normal.       Results:   Studies obtained and personally reviewed by me:  Had  normal Pap smear on 05/24/20.  07/22/22 mammogram showed bilateral changes from reduction mammoplasty, no suspicious mass or malignant type microcalcifications in either breast.  Had colonoscopy by Dr. Myrtie Neither in May 2023. Had precancerous polyps and he recommended repeat colonoscopy in 3 years.   Labs:       Component Value Date/Time   NA 138 11/16/2022 1223   NA 140 07/22/2022 1231   K 4.6 11/16/2022 1223   CL 103 11/16/2022 1223   CO2 27 11/16/2022 1223   GLUCOSE 128 (H) 11/16/2022 1223   BUN 17 11/16/2022 1223   BUN 19 07/22/2022 1231   CREATININE 1.19 (H) 11/16/2022 1223   CALCIUM 9.5 11/16/2022 1223   CALCIUM 9.3 08/14/2011 1217   PROT 6.8 11/16/2022 1223   PROT 6.6 07/22/2022 1231   ALBUMIN 4.3 07/22/2022 1231   AST 15 11/16/2022 1223   ALT 11 11/16/2022 1223   ALKPHOS 103 07/22/2022 1231   BILITOT 0.6 11/16/2022 1223   BILITOT 0.3 07/22/2022 1231   GFRNONAA 43 (L) 09/16/2021 1153   GFRNONAA 59 (L) 05/16/2020 1050   GFRAA 69 05/16/2020 1050     Lab Results  Component Value Date   WBC 3.8 11/16/2022   HGB 12.4 11/16/2022   HCT 38.3 11/16/2022   MCV 81.3 11/16/2022   PLT 346 11/16/2022    Lab Results  Component Value Date   CHOL 232 (H) 11/16/2022   HDL 81 11/16/2022   LDLCALC 134 (H) 11/16/2022   TRIG 77 11/16/2022   CHOLHDL 2.9 11/16/2022    Lab Results  Component Value Date   HGBA1C 7.6 (H) 11/16/2022     Lab Results  Component Value Date   TSH 2.56 11/16/2022      Assessment & Plan:   Mild intermittent asthma with an acute exacerbation: she has been having increased shortness of breath recently. Ordered BNP. Refilled albuterol and Symbicort inhalers, albuterol nebulizer. Referral to cardiology.  Type 2 diabetes mellitus: currently on Ozempic and will be switching to [facility] when that runs out. Taking metformin 500 mg twice daily. HGBA1c at 7.6% on 11/16/22, up from 7.4% on 07/14/22. Referral to Endocrinology.   Hyperlipidemia: treated with  rosuvastatin 5 mg daily. CHOL elevated at 232, LDL elevated at 134 on 11/16/22, up from 210 and 127 on 05/16/20. Refilled rosuvastatin   Essential hypertension: blood pressure stable on Coreg and Micardis. Blood pressure normal today at 120/80.  Hypothyroidism: stable with low-dose levothyroxine 25 mcg daily. TSH at 2.56.  Anxiety and Depression / PTSD: treated with Cymbalta. [facility] and [facility], counselor.   History of secondary hyperparathyroidism   History of onychomycosis seen by podiatrist   History of gastric bypass surgery  History of Vitamin D deficiency and is on high dose Vitamin D.  Reminded of eye exam.  Urinalysis normal today.  Had normal Pap smear on 05/24/20.  Stage 3a CKD with stable creatinine at 1.19  07/22/22 mammogram showed bilateral changes from reduction  mammoplasty, no suspicious mass or malignant type microcalcifications in either breast.  Had colonoscopy by Dr. Myrtie Neither in May 2023. Had precancerous polyps and he recommended repeat colonoscopy in 3 years.   Vaccine counseling: UTD on tetanus, shingles vaccines.  Return in 1 year or as needed.    I,Alexander Ruley,acting as a Neurosurgeon for Margaree Mackintosh, MD.,have documented all relevant documentation on the behalf of Margaree Mackintosh, MD,as directed by  Margaree Mackintosh, MD while in the presence of Margaree Mackintosh, MD.   I, Margaree Mackintosh, MD, have reviewed all documentation for this visit. The documentation on 11/20/22 for the exam, diagnosis, procedures, and orders are all accurate and complete.

## 2022-11-16 ENCOUNTER — Other Ambulatory Visit (HOSPITAL_COMMUNITY): Payer: Self-pay

## 2022-11-16 ENCOUNTER — Other Ambulatory Visit: Payer: 59

## 2022-11-16 ENCOUNTER — Encounter: Payer: 59 | Admitting: Internal Medicine

## 2022-11-16 DIAGNOSIS — F419 Anxiety disorder, unspecified: Secondary | ICD-10-CM | POA: Diagnosis not present

## 2022-11-16 DIAGNOSIS — F32A Depression, unspecified: Secondary | ICD-10-CM

## 2022-11-16 DIAGNOSIS — N1831 Chronic kidney disease, stage 3a: Secondary | ICD-10-CM | POA: Diagnosis not present

## 2022-11-16 DIAGNOSIS — E1122 Type 2 diabetes mellitus with diabetic chronic kidney disease: Secondary | ICD-10-CM | POA: Diagnosis not present

## 2022-11-16 DIAGNOSIS — M818 Other osteoporosis without current pathological fracture: Secondary | ICD-10-CM

## 2022-11-16 DIAGNOSIS — F439 Reaction to severe stress, unspecified: Secondary | ICD-10-CM

## 2022-11-16 DIAGNOSIS — E063 Autoimmune thyroiditis: Secondary | ICD-10-CM

## 2022-11-17 LAB — CBC WITH DIFFERENTIAL/PLATELET
Absolute Lymphocytes: 1740 {cells}/uL (ref 850–3900)
Absolute Monocytes: 277 {cells}/uL (ref 200–950)
Basophils Absolute: 30 {cells}/uL (ref 0–200)
Basophils Relative: 0.8 %
Eosinophils Absolute: 68 {cells}/uL (ref 15–500)
Eosinophils Relative: 1.8 %
HCT: 38.3 % (ref 35.0–45.0)
Hemoglobin: 12.4 g/dL (ref 11.7–15.5)
MCH: 26.3 pg — ABNORMAL LOW (ref 27.0–33.0)
MCHC: 32.4 g/dL (ref 32.0–36.0)
MCV: 81.3 fL (ref 80.0–100.0)
MPV: 9.5 fL (ref 7.5–12.5)
Monocytes Relative: 7.3 %
Neutro Abs: 1683 {cells}/uL (ref 1500–7800)
Neutrophils Relative %: 44.3 %
Platelets: 346 10*3/uL (ref 140–400)
RBC: 4.71 10*6/uL (ref 3.80–5.10)
RDW: 13.6 % (ref 11.0–15.0)
Total Lymphocyte: 45.8 %
WBC: 3.8 10*3/uL (ref 3.8–10.8)

## 2022-11-17 LAB — LIPID PANEL
Cholesterol: 232 mg/dL — ABNORMAL HIGH (ref <200)
HDL: 81 mg/dL (ref >=50)
LDL Cholesterol (Calc): 134 mg/dL — ABNORMAL HIGH
Non-HDL Cholesterol (Calc): 151 mg/dL — ABNORMAL HIGH (ref <130)
Total CHOL/HDL Ratio: 2.9 (calc) (ref <5.0)
Triglycerides: 77 mg/dL (ref <150)

## 2022-11-17 LAB — COMPLETE METABOLIC PANEL WITH GFR
AG Ratio: 1.8 (calc) (ref 1.0–2.5)
ALT: 11 U/L (ref 6–29)
AST: 15 U/L (ref 10–35)
Albumin: 4.4 g/dL (ref 3.6–5.1)
Alkaline phosphatase (APISO): 94 U/L (ref 37–153)
BUN/Creatinine Ratio: 14 (calc) (ref 6–22)
BUN: 17 mg/dL (ref 7–25)
CO2: 27 mmol/L (ref 20–32)
Calcium: 9.5 mg/dL (ref 8.6–10.4)
Chloride: 103 mmol/L (ref 98–110)
Creat: 1.19 mg/dL — ABNORMAL HIGH (ref 0.50–1.05)
Globulin: 2.4 g/dL (ref 1.9–3.7)
Glucose, Bld: 128 mg/dL — ABNORMAL HIGH (ref 65–99)
Potassium: 4.6 mmol/L (ref 3.5–5.3)
Sodium: 138 mmol/L (ref 135–146)
Total Bilirubin: 0.6 mg/dL (ref 0.2–1.2)
Total Protein: 6.8 g/dL (ref 6.1–8.1)
eGFR: 52 mL/min/{1.73_m2} — ABNORMAL LOW (ref >=60)

## 2022-11-17 LAB — HEMOGLOBIN A1C
Hgb A1c MFr Bld: 7.6 %{Hb} — ABNORMAL HIGH (ref <5.7)
Mean Plasma Glucose: 171 mg/dL
eAG (mmol/L): 9.5 mmol/L

## 2022-11-17 LAB — TSH: TSH: 2.56 m[IU]/L (ref 0.40–4.50)

## 2022-11-19 ENCOUNTER — Other Ambulatory Visit (HOSPITAL_COMMUNITY): Payer: Self-pay

## 2022-11-19 ENCOUNTER — Ambulatory Visit (INDEPENDENT_AMBULATORY_CARE_PROVIDER_SITE_OTHER): Payer: 59 | Admitting: Internal Medicine

## 2022-11-19 ENCOUNTER — Encounter: Payer: Self-pay | Admitting: Internal Medicine

## 2022-11-19 VITALS — BP 120/80 | HR 88 | Ht 63.0 in | Wt 134.0 lb

## 2022-11-19 DIAGNOSIS — R0602 Shortness of breath: Secondary | ICD-10-CM | POA: Diagnosis not present

## 2022-11-19 DIAGNOSIS — Z9884 Bariatric surgery status: Secondary | ICD-10-CM

## 2022-11-19 DIAGNOSIS — I10 Essential (primary) hypertension: Secondary | ICD-10-CM | POA: Diagnosis not present

## 2022-11-19 DIAGNOSIS — E1122 Type 2 diabetes mellitus with diabetic chronic kidney disease: Secondary | ICD-10-CM | POA: Diagnosis not present

## 2022-11-19 DIAGNOSIS — F419 Anxiety disorder, unspecified: Secondary | ICD-10-CM

## 2022-11-19 DIAGNOSIS — F32A Depression, unspecified: Secondary | ICD-10-CM

## 2022-11-19 DIAGNOSIS — N1831 Chronic kidney disease, stage 3a: Secondary | ICD-10-CM | POA: Diagnosis not present

## 2022-11-19 DIAGNOSIS — Z0000 Encounter for general adult medical examination without abnormal findings: Secondary | ICD-10-CM | POA: Diagnosis not present

## 2022-11-19 DIAGNOSIS — M810 Age-related osteoporosis without current pathological fracture: Secondary | ICD-10-CM | POA: Diagnosis not present

## 2022-11-19 DIAGNOSIS — Z6823 Body mass index (BMI) 23.0-23.9, adult: Secondary | ICD-10-CM

## 2022-11-19 DIAGNOSIS — J4521 Mild intermittent asthma with (acute) exacerbation: Secondary | ICD-10-CM

## 2022-11-19 LAB — POCT URINALYSIS DIP (CLINITEK)
Bilirubin, UA: NEGATIVE
Blood, UA: NEGATIVE
Glucose, UA: NEGATIVE mg/dL
Ketones, POC UA: NEGATIVE mg/dL
Leukocytes, UA: NEGATIVE
Nitrite, UA: NEGATIVE
Spec Grav, UA: 1.015 (ref 1.010–1.025)
Urobilinogen, UA: 0.2 U/dL
pH, UA: 6 (ref 5.0–8.0)

## 2022-11-19 MED ORDER — ALBUTEROL SULFATE (2.5 MG/3ML) 0.083% IN NEBU
2.5000 mg | INHALATION_SOLUTION | Freq: Four times a day (QID) | RESPIRATORY_TRACT | 1 refills | Status: AC | PRN
  Filled 2022-11-19: qty 150, 13d supply, fill #0

## 2022-11-19 MED ORDER — ROSUVASTATIN CALCIUM 5 MG PO TABS
5.0000 mg | ORAL_TABLET | Freq: Every day | ORAL | 0 refills | Status: DC
  Filled 2022-11-19: qty 90, 90d supply, fill #0

## 2022-11-19 MED ORDER — ALBUTEROL SULFATE HFA 108 (90 BASE) MCG/ACT IN AERS
2.0000 | INHALATION_SPRAY | Freq: Four times a day (QID) | RESPIRATORY_TRACT | 0 refills | Status: AC | PRN
  Filled 2022-11-19: qty 6.7, 25d supply, fill #0

## 2022-11-19 MED ORDER — BUDESONIDE-FORMOTEROL FUMARATE 80-4.5 MCG/ACT IN AERO
2.0000 | INHALATION_SPRAY | Freq: Two times a day (BID) | RESPIRATORY_TRACT | 3 refills | Status: DC
  Filled 2022-11-19: qty 10.2, 30d supply, fill #0

## 2022-11-19 NOTE — Patient Instructions (Signed)
Will see Endocrinologist in December. Make referrals to Pulmonary for persistent SOB despite inhalers and Cardiology with Hx of Diabetes and intermittent chest pain associated with fatigue and anxiety.

## 2022-11-20 DIAGNOSIS — F39 Unspecified mood [affective] disorder: Secondary | ICD-10-CM | POA: Diagnosis not present

## 2022-11-20 LAB — MICROALBUMIN / CREATININE URINE RATIO
Creatinine, Urine: 218 mg/dL (ref 20–275)
Microalb Creat Ratio: 6 mg/g{creat} (ref <30)
Microalb, Ur: 1.2 mg/dL

## 2022-11-20 LAB — BRAIN NATRIURETIC PEPTIDE: Brain Natriuretic Peptide: 101 pg/mL — ABNORMAL HIGH (ref <100)

## 2022-11-23 NOTE — Addendum Note (Signed)
Addended by: Theresia Lo on: 11/23/2022 04:31 PM   Modules accepted: Level of Service

## 2022-11-24 ENCOUNTER — Telehealth: Payer: Self-pay | Admitting: Interventional Cardiology

## 2022-11-24 NOTE — Telephone Encounter (Signed)
Pt c/o Shortness Of Breath: STAT if SOB developed within the last 24 hours or pt is noticeably SOB on the phone  1. Are you currently SOB (can you hear that pt is SOB on the phone)? slightly  2. How long have you been experiencing SOB? x2-10mo  3. Are you SOB when sitting or when up moving around? both  4. Are you currently experiencing any other symptoms? No BNP 101 Pt would also like to discuss selecting a new provider (former [place] pt)

## 2022-11-24 NOTE — Telephone Encounter (Signed)
Patient returned call.  She would like to re establish care.  She works at [employer] as a Engineer, civil (consulting).  Was previously seen by Catalina Gravel last seen Aug 2021.  She has been having ongoing shortness of breath.  Occurs at variable times - can be at rest, w exertion, laying down.  BPs can be on high side at times.  Has asthma and it may be related to that.  Has had type 2 DM since she was in her 30s.  Saw PCP last week who recommended she get back into cardiology.    She had hoped to see Dr. Excell Seltzer who is not accepting new patients.  I scheduled her with Dr. Clifton James in December and will add to wait list.  She had 2 heart caths in past, no intervention.   I am unable to see these.  Pt will call back if any new or worsening symptoms for a sooner appointment with different provider.

## 2022-11-24 NOTE — Telephone Encounter (Signed)
Left message for patient to call back  

## 2022-11-30 ENCOUNTER — Emergency Department (HOSPITAL_BASED_OUTPATIENT_CLINIC_OR_DEPARTMENT_OTHER)
Admission: EM | Admit: 2022-11-30 | Discharge: 2022-11-30 | Disposition: A | Discharge status: Home / Self Care | Payer: 59 | Attending: Emergency Medicine | Admitting: Emergency Medicine

## 2022-11-30 ENCOUNTER — Telehealth: Payer: Self-pay | Admitting: Internal Medicine

## 2022-11-30 ENCOUNTER — Emergency Department (HOSPITAL_BASED_OUTPATIENT_CLINIC_OR_DEPARTMENT_OTHER): Payer: 59

## 2022-11-30 ENCOUNTER — Other Ambulatory Visit: Payer: Self-pay

## 2022-11-30 ENCOUNTER — Encounter (HOSPITAL_BASED_OUTPATIENT_CLINIC_OR_DEPARTMENT_OTHER): Payer: Self-pay | Admitting: Radiology

## 2022-11-30 ENCOUNTER — Other Ambulatory Visit (HOSPITAL_BASED_OUTPATIENT_CLINIC_OR_DEPARTMENT_OTHER): Payer: Self-pay

## 2022-11-30 DIAGNOSIS — J40 Bronchitis, not specified as acute or chronic: Secondary | ICD-10-CM

## 2022-11-30 DIAGNOSIS — Z7951 Long term (current) use of inhaled steroids: Secondary | ICD-10-CM | POA: Insufficient documentation

## 2022-11-30 DIAGNOSIS — N189 Chronic kidney disease, unspecified: Secondary | ICD-10-CM | POA: Diagnosis not present

## 2022-11-30 DIAGNOSIS — Z7984 Long term (current) use of oral hypoglycemic drugs: Secondary | ICD-10-CM | POA: Insufficient documentation

## 2022-11-30 DIAGNOSIS — Z20822 Contact with and (suspected) exposure to covid-19: Secondary | ICD-10-CM | POA: Diagnosis not present

## 2022-11-30 DIAGNOSIS — R079 Chest pain, unspecified: Secondary | ICD-10-CM | POA: Diagnosis not present

## 2022-11-30 DIAGNOSIS — E1122 Type 2 diabetes mellitus with diabetic chronic kidney disease: Secondary | ICD-10-CM | POA: Insufficient documentation

## 2022-11-30 DIAGNOSIS — J45909 Unspecified asthma, uncomplicated: Secondary | ICD-10-CM | POA: Insufficient documentation

## 2022-11-30 DIAGNOSIS — R0602 Shortness of breath: Secondary | ICD-10-CM | POA: Diagnosis not present

## 2022-11-30 DIAGNOSIS — I2510 Atherosclerotic heart disease of native coronary artery without angina pectoris: Secondary | ICD-10-CM | POA: Diagnosis not present

## 2022-11-30 DIAGNOSIS — R918 Other nonspecific abnormal finding of lung field: Secondary | ICD-10-CM | POA: Diagnosis not present

## 2022-11-30 LAB — BASIC METABOLIC PANEL
Anion gap: 9 (ref 5–15)
BUN: 18 mg/dL (ref 8–23)
CO2: 23 mmol/L (ref 22–32)
Calcium: 9.2 mg/dL (ref 8.9–10.3)
Chloride: 103 mmol/L (ref 98–111)
Creatinine, Ser: 1.28 mg/dL — ABNORMAL HIGH (ref 0.44–1.00)
GFR, Estimated: 48 mL/min — ABNORMAL LOW (ref >60)
Glucose, Bld: 126 mg/dL — ABNORMAL HIGH (ref 70–99)
Potassium: 5 mmol/L (ref 3.5–5.1)
Sodium: 135 mmol/L (ref 135–145)

## 2022-11-30 LAB — RESP PANEL BY RT-PCR (RSV, FLU A&B, COVID)  RVPGX2
Influenza A by PCR: NEGATIVE
Influenza B by PCR: NEGATIVE
Resp Syncytial Virus by PCR: NEGATIVE
SARS Coronavirus 2 by RT PCR: NEGATIVE

## 2022-11-30 LAB — CBC
HCT: 36.8 % (ref 36.0–46.0)
Hemoglobin: 12 g/dL (ref 12.0–15.0)
MCH: 26.5 pg (ref 26.0–34.0)
MCHC: 32.6 g/dL (ref 30.0–36.0)
MCV: 81.4 fL (ref 80.0–100.0)
Platelets: 346 10*3/uL (ref 150–400)
RBC: 4.52 MIL/uL (ref 3.87–5.11)
RDW: 14.1 % (ref 11.5–15.5)
WBC: 5.2 10*3/uL (ref 4.0–10.5)
nRBC: 0 % (ref 0.0–0.2)

## 2022-11-30 LAB — TROPONIN I (HIGH SENSITIVITY): Troponin I (High Sensitivity): 2 ng/L (ref <18)

## 2022-11-30 LAB — BRAIN NATRIURETIC PEPTIDE: B Natriuretic Peptide: 87.6 pg/mL (ref 0.0–100.0)

## 2022-11-30 MED ORDER — IOHEXOL 350 MG/ML SOLN
100.0000 mL | Freq: Once | INTRAVENOUS | Status: AC | PRN
  Administered 2022-11-30: 60 mL via INTRAVENOUS

## 2022-11-30 MED ORDER — PREDNISONE 20 MG PO TABS
40.0000 mg | ORAL_TABLET | Freq: Every day | ORAL | 0 refills | Status: DC
  Filled 2022-11-30: qty 10, 5d supply, fill #0

## 2022-11-30 MED ORDER — DOXYCYCLINE HYCLATE 100 MG PO CAPS
100.0000 mg | ORAL_CAPSULE | Freq: Two times a day (BID) | ORAL | 0 refills | Status: DC
  Filled 2022-11-30: qty 20, 10d supply, fill #0

## 2022-11-30 NOTE — ED Notes (Signed)
Discharge paperwork given and verbally understood. 

## 2022-11-30 NOTE — ED Provider Notes (Signed)
[facility] AT [facility] Provider Note   CSN: [id number] Arrival date & time: 11/30/22  1044     History  No chief complaint on file.   Alexa [name] is a 62 y.o. female.  Patient here with shortness of breath the last few months.  Worsened here over the last day or 2.  History of asthma.  May be history of long COVID as she is never really been quite the same with her asthma or breathing since she had COVID.  She denies any history of blood clots.  Maybe some mild chest pain.  She denies any major cough or sputum production or fever or chills currently.  No leg swelling.  No history of heart failure or CAD.  The history is provided by the patient.       Home Medications Prior to Admission medications   Medication Sig Start Date End Date Taking? Authorizing Provider  doxycycline (VIBRAMYCIN) 100 MG capsule Take 1 capsule (100 mg total) by mouth 2 (two) times daily. 11/30/22  Yes Shashwat Cleary, DO  predniSONE (DELTASONE) 20 MG tablet Take 2 tablets (40 mg total) by mouth daily for 5 days. 11/30/22 12/05/22 Yes Arthur Aydelotte, DO  albuterol (PROVENTIL) (2.5 MG/3ML) 0.083% nebulizer solution Inhale 3 mLs (1vial) by nebulization every 6 (six) hours as needed for wheezing or shortness of breath. 11/19/22   Margaree Mackintosh, MD  albuterol (VENTOLIN HFA) 108 (90 Base) MCG/ACT inhaler Inhale 2 puffs into the lungs every 6 (six) hours as needed for wheezing or shortness of breath. 03/31/19   Margaree Mackintosh, MD  albuterol (VENTOLIN HFA) 108 (90 Base) MCG/ACT inhaler Inhale 2 puffs into the lungs every 6 (six) hours as needed for wheezing or shortness of breath. 11/19/22   Margaree Mackintosh, MD  betamethasone valerate ointment (VALISONE) 0.1 % Apply 1 application topically 2 (two) times daily. 07/24/20   Margaree Mackintosh, MD  Blood Glucose Monitoring Suppl (FREESTYLE LITE) DEVI Use to check glucose 3x daily 01/03/18   David Stall, MD  budesonide-formoterol [facility])  80-4.5 MCG/ACT inhaler Inhale 2 puffs into the lungs 2 (two) times daily. 11/19/22   Margaree Mackintosh, MD  calcium-vitamin D (OSCAL WITH D) 500-200 MG-UNIT TABS tablet Take by mouth.    [provider]  carvedilol (COREG) 3.125 MG tablet Take 1 tablet (3.125 mg total) by mouth 2 (two) times daily with a meal. 03/11/21   David Stall, MD  Cholecalciferol 50 MCG (2000 UT) CAPS Take 1 capsule by mouth daily.    [provider]  conjugated estrogens (PREMARIN) vaginal cream Place 1 applicatorful vaginally at bedtime for 21 days then off for 7 days. 05/24/20   Margaree Mackintosh, MD  Cyanocobalamin (VITAMIN B 12) 100 MCG LOZG Take by mouth.    [provider]  cyclobenzaprine (FLEXERIL) 10 MG tablet Take 1 tablet (10 mg total) by mouth 3 (three) times daily as needed for muscle spasms. 10/01/20   Margaree Mackintosh, MD  DULoxetine (CYMBALTA) 60 MG capsule Take 1 capsule (60 mg total) by mouth daily. 11/26/21   Mozingo, Thereasa Solo, NP  Efinaconazole 10 % SOLN Apply 1 drop topically daily. 06/19/21   Vivi Barrack, DPM  Ferrous Sulfate (SLOW FE PO) Take 1 tablet by mouth daily.     [provider]  glucose blood (FREESTYLE LITE) test strip Use 1 to check blood glucose 3 times daily. 09/19/20   Whitmire, Dawn, FNP  ibuprofen (ADVIL,MOTRIN)  200 MG tablet Take 600 mg by mouth daily as needed for headache or moderate pain.    [provider]  levothyroxine (SYNTHROID) 25 MCG tablet Take 1 tablet (25 mcg total) by mouth daily. 10/23/21   David Stall, MD  metFORMIN (GLUCOPHAGE) 500 MG tablet Take 1 tablet (500 mg total) by mouth 2 (two) times daily with a meal. 08/20/21   David Stall, MD  Multiple Vitamin (MULTIVITAMIN) tablet Take 1 tablet by mouth daily. Reported on 02/26/2015 03/06/17   Edsel Petrin, DO  ondansetron (ZOFRAN) 4 MG tablet Take 1 tablet (4 mg total) by mouth every 8 (eight) hours as needed for nausea or vomiting. 03/11/17   Margaree Mackintosh, MD   pantoprazole (PROTONIX) 40 MG tablet TAKE 1 TABLET (40 MG TOTAL) BY MOUTH DAILY. 12/11/19 07/14/22  David Stall, MD  rosuvastatin (CRESTOR) 5 MG tablet Take 1 tablet (5 mg total) by mouth daily. 11/19/22   Margaree Mackintosh, MD  telmisartan (MICARDIS) 40 MG tablet Take 0.5 tablets (20 mg total) by mouth 2 (two) times daily. 10/23/21   David Stall, MD  tirzepatide [facility]) 15 MG/0.5ML Pen Inject 15 mg into the skin once a week. 10/15/22   Quillian Quince D, MD  Vitamin D, Ergocalciferol, (DRISDOL) 1.25 MG (50000 UNIT) CAPS capsule Take 1 capsule (50,000 Units total) by mouth every Sunday. 10/18/22   Quillian Quince D, MD      Allergies    Theophyllines    Review of Systems   Review of Systems  Physical Exam Updated Vital Signs BP (!) 160/92   Pulse 74   Temp 97.8 F (36.6 C) (Oral)   Resp 19   LMP 05/11/2010   SpO2 100%  Physical Exam Vitals and nursing note reviewed.  Constitutional:      General: She is not in acute distress.    Appearance: She is well-developed. She is not ill-appearing.  HENT:     Head: Normocephalic and atraumatic.     Nose: Nose normal.     Mouth/Throat:     Mouth: Mucous membranes are moist.  Eyes:     Extraocular Movements: Extraocular movements intact.     Conjunctiva/sclera: Conjunctivae normal.     Pupils: Pupils are equal, round, and reactive to light.  Cardiovascular:     Rate and Rhythm: Normal rate and regular rhythm.     Pulses: Normal pulses.     Heart sounds: Normal heart sounds. No murmur heard. Pulmonary:     Effort: Pulmonary effort is normal. No respiratory distress.     Breath sounds: Normal breath sounds.  Abdominal:     Palpations: Abdomen is soft.     Tenderness: There is no abdominal tenderness.  Musculoskeletal:        General: No swelling or tenderness. Normal range of motion.     Cervical back: Normal range of motion and neck supple.  Skin:    General: Skin is warm and dry.     Capillary Refill: Capillary refill  takes less than 2 seconds.  Neurological:     General: No focal deficit present.     Mental Status: She is alert.  Psychiatric:        Mood and Affect: Mood normal.     ED Results / Procedures / Treatments   Labs (all labs ordered are listed, but only abnormal results are displayed) Labs Reviewed  BASIC METABOLIC PANEL - Abnormal; Notable for the following components:      Result  Value   Glucose, Bld 126 (*)    Creatinine, Ser 1.28 (*)    GFR, Estimated 48 (*)    All other components within normal limits  RESP PANEL BY RT-PCR (RSV, FLU A&B, COVID)  [id number]  CBC  BRAIN NATRIURETIC PEPTIDE  TROPONIN I (HIGH SENSITIVITY)    EKG EKG Interpretation Date/Time:  Monday November 30 2022 10:55:25 EST Ventricular Rate:  83 PR Interval:  138 QRS Duration:  114 QT Interval:  390 QTC Calculation: 458 R Axis:   10  Text Interpretation: Normal sinus rhythm Incomplete right bundle branch block Borderline ECG When compared with ECG of 16-Sep-2021 11:18, Incomplete right bundle branch block is now Present Nonspecific T wave abnormality now evident in Anterior leads Confirmed by Virgina Norfolk [phone]) on 11/30/2022 11:39:32 AM  Radiology CT Angio Chest PE W and/or Wo Contrast  Result Date: 11/30/2022 CLINICAL DATA:  Shortness of breath for 2 months, worsening today, chest pain, PE suspected EXAM: CT ANGIOGRAPHY CHEST WITH CONTRAST TECHNIQUE: Multidetector CT imaging of the chest was performed using the standard protocol during bolus administration of intravenous contrast. Multiplanar CT image reconstructions and MIPs were obtained to evaluate the vascular anatomy. RADIATION DOSE REDUCTION: This exam was performed according to the departmental dose-optimization program which includes automated exposure control, adjustment of the mA and/or kV according to patient size and/or use of iterative reconstruction technique. CONTRAST:  60mL OMNIPAQUE IOHEXOL 350 MG/ML SOLN COMPARISON:  None Available.  FINDINGS: Cardiovascular: Satisfactory opacification of the pulmonary arteries to the segmental level. No evidence of pulmonary embolism. Normal heart size. Left coronary artery calcifications. No pericardial effusion. Mediastinum/Nodes: No enlarged mediastinal, hilar, or axillary lymph nodes. Small benign calcified granulomatous subcarinal and right hilar lymph nodes, sequelae of prior granulomatous infection for which no specific further follow-up or characterization is required. Thyroid gland, trachea, and esophagus demonstrate no significant findings. Lungs/Pleura: Mild diffuse bilateral bronchial wall thickening. No pleural effusion or pneumothorax. Upper Abdomen: No acute abnormality. Roux type gastric bypass. Large burden of stool in the included colon. Musculoskeletal: Fat necrosis in the central left breast (series 5, image 130). No acute osseous findings. Review of the MIP images confirms the above findings. IMPRESSION: 1. Negative examination for pulmonary embolism. 2. Mild diffuse bilateral bronchial wall thickening, consistent with nonspecific infectious or inflammatory bronchitis. 3. Coronary artery disease. 4. Roux type gastric bypass. Electronically Signed   By: Jearld Lesch M.D.   On: 11/30/2022 14:27   DG Chest Portable 1 View  Result Date: 11/30/2022 CLINICAL DATA:  Several month history of shortness of breath and left-sided chest pain EXAM: PORTABLE CHEST 1 VIEW COMPARISON:  Chest radiograph dated 09/16/2021 FINDINGS: Normal lung volumes. No focal consolidations. No pleural effusion or pneumothorax. The heart size and mediastinal contours are within normal limits. No acute osseous abnormality. IMPRESSION: No active disease. Electronically Signed   By: Agustin Cree M.D.   On: 11/30/2022 13:26    Procedures Procedures    Medications Ordered in ED Medications  iohexol (OMNIPAQUE) 350 MG/ML injection 100 mL (60 mLs Intravenous Contrast Given 11/30/22 1213)    ED Course/ Medical Decision  Making/ A&P                                 Medical Decision Making Amount and/or Complexity of Data Reviewed Labs: ordered. Radiology: ordered.  Risk Prescription drug management.   WREN PRYCE is here with shortness of breath.  History of  diabetes, anxiety, CKD.  Differential diagnosis could be bronchitis/inflammatory process versus less likely ACS PE pneumonia.  EKG shows sinus rhythm.  No ischemic changes.  Symptoms have been ongoing for a while may be worse to the last few days.  Will evaluate with PE scan, troponin, basic labs and EKG which has already been performed.  Per my review and interpretation labs no significant anemia or electrolyte abnormality or kidney injury or leukocytosis.  Troponin is normal.  Not having any chest pain.  Doubt ACS.  No evidence of pneumonia or pneumothorax on x-ray of the chest.  At baseline.  COVID and flu test negative.  No significant anemia or leukocytosis.  Overall awaiting CT scan of chest to further evaluate for pneumonia versus PE.  Overall per radiology report may be inflammatory bronchitis.  No PE.  Overall will prescribe prednisone and doxycycline have her follow-up with pulmonology and primary care.  Patient discharged in good condition.  Understands return precautions.  This chart was dictated using voice recognition software.  Despite best efforts to proofread,  errors can occur which can change the documentation meaning.         Final Clinical Impression(s) / ED Diagnoses Final diagnoses:  SOB (shortness of breath)  Bronchitis    Rx / DC Orders ED Discharge Orders          Ordered    Ambulatory referral to Pulmonology        11/30/22 1422    doxycycline (VIBRAMYCIN) 100 MG capsule  2 times daily        11/30/22 1437    predniSONE (DELTASONE) 20 MG tablet  Daily        11/30/22 1437              Virgina Norfolk, DO 11/30/22 1438

## 2022-11-30 NOTE — Telephone Encounter (Signed)
Alexa [name] called to say she is really having shortness of breath today, so she is on her way to urgent care. Will call back to let us know what they say.

## 2022-11-30 NOTE — Discharge Instructions (Addendum)
I have referred you to pulmonology for further evaluation.  I have prescribed you steroids and antibiotics, take as prescribed.  Continue to use inhalers as needed.

## 2022-11-30 NOTE — ED Triage Notes (Signed)
Pt caox4, NAD c/o SOB x2 mo, but states SOB worsened today and she has had CP since waking up this morning. PMH asthma. Pt denies PMH DVT/PE.

## 2022-12-02 ENCOUNTER — Other Ambulatory Visit (HOSPITAL_COMMUNITY): Payer: Self-pay

## 2022-12-02 ENCOUNTER — Encounter: Payer: Self-pay | Admitting: Pulmonary Disease

## 2022-12-02 ENCOUNTER — Ambulatory Visit (INDEPENDENT_AMBULATORY_CARE_PROVIDER_SITE_OTHER): Payer: 59 | Admitting: Pulmonary Disease

## 2022-12-02 VITALS — BP 122/70 | HR 84 | Temp 97.6°F | Ht 63.0 in | Wt 140.0 lb

## 2022-12-02 DIAGNOSIS — R0602 Shortness of breath: Secondary | ICD-10-CM

## 2022-12-02 DIAGNOSIS — J4540 Moderate persistent asthma, uncomplicated: Secondary | ICD-10-CM | POA: Diagnosis not present

## 2022-12-02 LAB — NITRIC OXIDE: Nitric Oxide: 10

## 2022-12-02 MED ORDER — BUDESONIDE-FORMOTEROL FUMARATE 160-4.5 MCG/ACT IN AERO
2.0000 | INHALATION_SPRAY | Freq: Two times a day (BID) | RESPIRATORY_TRACT | 12 refills | Status: DC
  Filled 2022-12-02: qty 10.2, 30d supply, fill #0
  Filled 2022-12-30: qty 10.2, 30d supply, fill #1

## 2022-12-02 MED ORDER — AIRSUPRA 90-80 MCG/ACT IN AERO
2.0000 | INHALATION_SPRAY | Freq: Four times a day (QID) | RESPIRATORY_TRACT | 3 refills | Status: AC
  Filled 2022-12-02: qty 10.7, 30d supply, fill #0
  Filled 2022-12-30: qty 10.7, 30d supply, fill #1

## 2022-12-02 NOTE — Progress Notes (Signed)
Synopsis: Referred in by Alexa Mackintosh, MD   Subjective:   PATIENT ID: Alexa [name] GENDER: female DOB: 1960/08/09, MRN: [id number]  Chief Complaint  Patient presents with   Consult    Shortness of breath on exertion and occasional at rest. Occasional cough and wheezing.     HPI Alexa [name] is a pleasant 62 year old female patient with a past medical history of mild intermittent asthma, hypertension, hyperlipidemia presenting today to the pulmonary clinic for further evaluation of her asthma.  She was diagnosed with asthma as a child and has been on albuterol as needed.  About a year ago she contracted COVID and her asthma has worsened.  [employer] she has encountered COVID again and required 2 rounds of steroids after which she felt better for short period of time up until 3 months ago.  She describes her symptoms as chest tightness wheezing inability to take in deep breaths.  She denies any chest pain.  She also reports significant shortness of breath on exertion and she needs to sit down for 10 to 15 minutes that she cannot catch her breath.  As a result she had an ED visit on 11/4, ECG with new incomplete right bundle branch block.  High-sensitivity troponin and BNP was within normal limit.  CTA of the chest ruled out PE but shows mild diffuse bilateral bronchial wall thickening.   Family history: No family history of asthma   Social history:  Never\ smoker, denies alcohol or illicit drug use. Works as a Orthoptist.   ROS All systems were reviewed and are negative except for the above.  Objective:   Vitals:   12/02/22 0949  BP: 122/70  Pulse: 84  Temp: 97.6 F (36.4 C)  TempSrc: Temporal  SpO2: 99%  Weight: 140 lb (63.5 kg)  Height: 5\' 3"  (1.6 m)   99% on RA BMI Readings from Last 3 Encounters:  12/02/22 24.80 kg/m  11/19/22 23.74 kg/m  10/15/22 23.74 kg/m   Wt Readings from Last 3 Encounters:  12/02/22 140 lb (63.5 kg)  11/19/22 134 lb (60.8 kg)  10/15/22  134 lb (60.8 kg)    Physical Exam GEN: NAD, Healthy Appearing HEENT: Supple Neck, Reactive Pupils, EOMI  CVS: Normal S1, Normal S2, RRR, No murmurs or ES appreciated  Lungs: Clear bilateral air entry.  Abdomen: Soft, non tender, non distended, + BS  Extremities: Warm and well perfused, No edema  Skin: No suspicious lesions appreciated  Psych: Normal Affect  Ancillary Information   CBC    Component Value Date/Time   WBC 5.2 11/30/2022 1058   RBC 4.52 11/30/2022 1058   HGB 12.0 11/30/2022 1058   HCT 36.8 11/30/2022 1058   PLT 346 11/30/2022 1058   MCV 81.4 11/30/2022 1058   MCH 26.5 11/30/2022 1058   MCHC 32.6 11/30/2022 1058   RDW 14.1 11/30/2022 1058   LYMPHSABS 2,457 10/14/2021 1525   MONOABS 0.8 10/19/2015 1327   EOSABS 68 11/16/2022 1223   BASOSABS 30 11/16/2022 1223    Labs and imaging were reviewed.      No data to display           Assessment & Plan:  Alexa [name] is a pleasant 62 year old female patient with a past medical history of mild intermittent asthma, hypertension, hyperlipidemia presenting today to the pulmonary clinic for further evaluation of her asthma.  #Moderate to severe persistent asthma  FENO 10.   []  Increase budesonide-formoterol [Symbicort] to 160-4.52 puffs twice a day. []   Start budesonide-albuterol [Airsupra] 80-92 puffs every 6 hours as needed. []  Finish steroids 40 mg p.o. daily prednisone on Friday. []  CBC with differential and antigen panel with total IgE a week after finishing steroids. []  Pulmonary function test  #New incomplete RBBB  I think her symptoms are mostly from her uncontrolled asthma in the setting of contracting COVID-19 6 months ago.  She does express significant dyspnea on exertion.  Her ECG in in the ED showed a new incomplete right bundle branch block.  []  Obtain an echocardiogram to evaluate cardiac function and valvular disease. []  Has a cardiology appointment on 12/20.   Return in about 4 weeks (around  12/30/2022).  I spent 60 minutes caring for this patient today, including preparing to see the patient, obtaining a medical history , reviewing a separately obtained history, performing a medically appropriate examination and/or evaluation, counseling and educating the patient/family/caregiver, ordering medications, tests, or procedures, documenting clinical information in the electronic health record, and independently interpreting results (not separately reported/billed) and communicating results to the patient/family/caregiver  Alexa Colonel, MD [facility] 12/02/2022 10:41 AM

## 2022-12-03 ENCOUNTER — Ambulatory Visit (HOSPITAL_COMMUNITY)
Admission: RE | Admit: 2022-12-03 | Discharge: 2022-12-03 | Disposition: A | Discharge status: Home / Self Care | Payer: 59 | Source: Ambulatory Visit | Attending: Pulmonary Disease | Admitting: Pulmonary Disease

## 2022-12-03 DIAGNOSIS — J4540 Moderate persistent asthma, uncomplicated: Secondary | ICD-10-CM | POA: Diagnosis not present

## 2022-12-03 LAB — PULMONARY FUNCTION TEST
DL/VA % pred: 109 %
DL/VA: 4.65 ml/min/mmHg/L
DLCO cor % pred: 96 %
DLCO cor: 18.78 ml/min/mmHg
DLCO unc % pred: 92 %
DLCO unc: 17.92 ml/min/mmHg
FEF 25-75 Post: 2.75 L/s
FEF 25-75 Pre: 2.78 L/s
FEF2575-%Change-Post: 0 %
FEF2575-%Pred-Post: 122 %
FEF2575-%Pred-Pre: 123 %
FEV1-%Change-Post: 0 %
FEV1-%Pred-Post: 105 %
FEV1-%Pred-Pre: 104 %
FEV1-Post: 2.57 L
FEV1-Pre: 2.55 L
FEV1FVC-%Change-Post: 0 %
FEV1FVC-%Pred-Pre: 105 %
FEV6-%Change-Post: 0 %
FEV6-%Pred-Post: 102 %
FEV6-%Pred-Pre: 102 %
FEV6-Post: 3.12 L
FEV6-Pre: 3.1 L
FEV6FVC-%Change-Post: 0 %
FEV6FVC-%Pred-Post: 103 %
FEV6FVC-%Pred-Pre: 103 %
FVC-%Change-Post: 0 %
FVC-%Pred-Post: 98 %
FVC-%Pred-Pre: 98 %
FVC-Post: 3.12 L
FVC-Pre: 3.12 L
Post FEV1/FVC ratio: 82 %
Post FEV6/FVC ratio: 100 %
Pre FEV1/FVC ratio: 82 %
Pre FEV6/FVC Ratio: 100 %
RV % pred: 122 %
RV: 2.41 L
TLC % pred: 111 %
TLC: 5.45 L

## 2022-12-03 MED ORDER — ALBUTEROL SULFATE (2.5 MG/3ML) 0.083% IN NEBU
2.5000 mg | INHALATION_SOLUTION | Freq: Once | RESPIRATORY_TRACT | Status: AC
  Administered 2022-12-03: 2.5 mg via RESPIRATORY_TRACT

## 2022-12-04 ENCOUNTER — Encounter: Payer: Self-pay | Admitting: Pulmonary Disease

## 2022-12-04 NOTE — Telephone Encounter (Signed)
JP, please advise. Thanks

## 2022-12-07 NOTE — Telephone Encounter (Signed)
Noted  

## 2022-12-10 DIAGNOSIS — F39 Unspecified mood [affective] disorder: Secondary | ICD-10-CM | POA: Diagnosis not present

## 2022-12-13 ENCOUNTER — Encounter (INDEPENDENT_AMBULATORY_CARE_PROVIDER_SITE_OTHER): Payer: Self-pay | Admitting: Family Medicine

## 2022-12-14 ENCOUNTER — Telehealth: Payer: Self-pay | Admitting: Pulmonary Disease

## 2022-12-14 ENCOUNTER — Ambulatory Visit (INDEPENDENT_AMBULATORY_CARE_PROVIDER_SITE_OTHER): Payer: 59 | Admitting: Family Medicine

## 2022-12-14 DIAGNOSIS — J4540 Moderate persistent asthma, uncomplicated: Secondary | ICD-10-CM | POA: Diagnosis not present

## 2022-12-14 DIAGNOSIS — R0602 Shortness of breath: Secondary | ICD-10-CM

## 2022-12-14 NOTE — Telephone Encounter (Signed)
Patient is calling because she is trying to get blood work done at [facility] but they do not have an order placed. Please call once order is placed so patient can go get blood work done.[phone]

## 2022-12-14 NOTE — Telephone Encounter (Signed)
Please reschedule w/ pt

## 2022-12-14 NOTE — Telephone Encounter (Signed)
I have released the labs and notified the patient.  Nothing further needed.

## 2022-12-17 ENCOUNTER — Encounter: Payer: Self-pay | Admitting: Cardiology

## 2022-12-17 ENCOUNTER — Ambulatory Visit: Payer: 59 | Attending: Cardiology | Admitting: Cardiology

## 2022-12-17 VITALS — BP 130/78 | HR 82 | Ht 63.0 in | Wt 136.6 lb

## 2022-12-17 DIAGNOSIS — I2510 Atherosclerotic heart disease of native coronary artery without angina pectoris: Secondary | ICD-10-CM

## 2022-12-17 DIAGNOSIS — N1831 Chronic kidney disease, stage 3a: Secondary | ICD-10-CM | POA: Diagnosis not present

## 2022-12-17 DIAGNOSIS — E1122 Type 2 diabetes mellitus with diabetic chronic kidney disease: Secondary | ICD-10-CM

## 2022-12-17 DIAGNOSIS — I10 Essential (primary) hypertension: Secondary | ICD-10-CM | POA: Diagnosis not present

## 2022-12-17 DIAGNOSIS — R0609 Other forms of dyspnea: Secondary | ICD-10-CM | POA: Diagnosis not present

## 2022-12-17 DIAGNOSIS — Z9884 Bariatric surgery status: Secondary | ICD-10-CM | POA: Diagnosis not present

## 2022-12-17 LAB — ALLERGEN PANEL (27) + IGE
Alternaria Alternata IgE: <0.1 kU/L
Aspergillus Fumigatus IgE: <0.1 kU/L
Bahia Grass IgE: <0.1 kU/L
Bermuda Grass IgE: <0.1 kU/L
Cat Dander IgE: <0.1 kU/L
Cedar, Mountain IgE: <0.1 kU/L
Cladosporium Herbarum IgE: <0.1 kU/L
Cocklebur IgE: <0.1 kU/L
Cockroach, American IgE: <0.1 kU/L
Common Silver Birch IgE: <0.1 kU/L
D Farinae IgE: <0.1 kU/L
D Pteronyssinus IgE: <0.1 kU/L
Dog Dander IgE: <0.1 kU/L
Elm, American IgE: <0.1 kU/L
Hickory, White IgE: <0.1 kU/L
IgE (Immunoglobulin E), Serum: 8 [IU]/mL (ref 6–495)
Johnson Grass IgE: <0.1 kU/L
Kentucky Bluegrass IgE: <0.1 kU/L
Maple/Box Elder IgE: <0.1 kU/L
Mucor Racemosus IgE: <0.1 kU/L
Oak, White IgE: <0.1 kU/L
Penicillium Chrysogen IgE: <0.1 kU/L
Pigweed, Rough IgE: <0.1 kU/L
Plantain, English IgE: <0.1 kU/L
Ragweed, Short IgE: <0.1 kU/L
Setomelanomma Rostrat: <0.1 kU/L
Timothy Grass IgE: <0.1 kU/L
White Mulberry IgE: <0.1 kU/L

## 2022-12-17 LAB — CBC WITH DIFFERENTIAL/PLATELET
Basophils Absolute: 0.1 10*3/uL (ref 0.0–0.2)
Basos: 1 %
EOS (ABSOLUTE): 0.1 10*3/uL (ref 0.0–0.4)
Eos: 1 %
Hematocrit: 36 % (ref 34.0–46.6)
Hemoglobin: 11.6 g/dL (ref 11.1–15.9)
Immature Grans (Abs): 0 10*3/uL (ref 0.0–0.1)
Immature Granulocytes: 0 %
Lymphocytes Absolute: 2.1 10*3/uL (ref 0.7–3.1)
Lymphs: 44 %
MCH: 26.7 pg (ref 26.6–33.0)
MCHC: 32.2 g/dL (ref 31.5–35.7)
MCV: 83 fL (ref 79–97)
Monocytes Absolute: 0.3 10*3/uL (ref 0.1–0.9)
Monocytes: 7 %
Neutrophils Absolute: 2.2 10*3/uL (ref 1.4–7.0)
Neutrophils: 47 %
Platelets: 342 10*3/uL (ref 150–450)
RBC: 4.34 x10E6/uL (ref 3.77–5.28)
RDW: 13.6 % (ref 11.7–15.4)
WBC: 4.8 10*3/uL (ref 3.4–10.8)

## 2022-12-17 NOTE — Progress Notes (Signed)
Cardiology Consultation:    Date:  12/17/2022   ID:  Alexa [name], DOB 12-19-1960, MRN [id number]  PCP:  Margaree Mackintosh, MD  Cardiologist:  Gypsy Balsam, MD   Referring MD: Margaree Mackintosh, MD   Chief Complaint  Patient presents with   Shortness of Breath    History of Present Illness:    Alexa MISKIEWICZ is a 62 y.o. female who is being seen today for the evaluation of shortness of breath at the request of Baxley, Luanna Cole, MD. past medical history significant for coronary artery disease in 2007 she did have coronary artery angiogram done which showed 20 to 30% narrowing of LAD ostially and 30 to 40% mid LAD stenosis, she does have history of hypertension, diabetes type 2, dyslipidemia, obesity, status post gastric bypass surgery, peripheral neuropathy, hypothyroidism, GERD, asthma.  She was referred to [name] because she started complaining of having some shortness of breath.  That happened few months ago she is being seeing pulmonologist for that and she seems to doing better after giving some bronchodilators.  However she still complained to have some shortness of breath fatigue and tiredness.  There is no typical chest pain tightness squeezing pressure burning chest but clearly shortness of breath is there.  She admits of noncompliance.  There are some medication she does not take on the regular basis.  Her diabetes is fairly controlled but could be better she was given cholesterol medication but did not start taking this yet.  Likely she never smoked.  She does have family history of coronary artery disease her father again passed by surgery but not premature.  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  Past Medical History:  Diagnosis Date   Allergy    Anemia, iron deficiency    Anhedonia    Anxiety    Asthma    Asthma, chronic    Back pain    CAD (coronary artery disease)    Cervical spondylosis 06/13/2020    Chewing difficulty    Chronic insomnia    Chronic kidney disease    CKD stage 3 A    Combined hyperlipidemia    Constipation    Depression    Diabetes mellitus    Diabetes mellitus type II    Diabetic autonomic neuropathy (HCC)    Difficulty swallowing pills    DM type 2 with diabetic peripheral neuropathy (HCC)    Dry mouth    Dyspepsia    Dyspnea    Elevated homocysteine    Essential tremor    Fatigue    Fatty liver    GERD (gastroesophageal reflux disease)    GERD (gastroesophageal reflux disease)    Glossitis    Goiter    Hemorrhoids    HLD (hyperlipidemia)    Hyperparathyroidism , secondary, non-renal (HCC)    Hypertension    Hypocalcemia    Insomnia    Leg edema    Leg pain    Macular degeneration    Nonproliferative diabetic retinopathy associated with type 2 diabetes mellitus (HCC)    RESOLVED   Obesity, Class III, BMI 40-49.9 (morbid obesity) (HCC)    Osteopenia    Osteoporosis    Pallor    PONV (postoperative nausea and vomiting)    Status post gastric surgery    Tachycardia    Thyroiditis, autoimmune    Vaginal dryness, menopausal    Vitamin D  deficiency     Past Surgical History:  Procedure Laterality Date   BREAST REDUCTION SURGERY Bilateral 12/14/2017   Procedure: BILATERAL MAMMARY REDUCTION  (BREAST);  Surgeon: Glenna Fellows, MD;  Location: [facility];  Service: Plastics;  Laterality: Bilateral;   CARDIAC CATHETERIZATION     x 2   COLONOSCOPY  09/2012   REDUCTION MAMMAPLASTY  11/2017   ROUX-EN-Y PROCEDURE     TONSILLECTOMY      Current Medications: Current Meds  Medication Sig   albuterol (PROVENTIL) (2.5 MG/3ML) 0.083% nebulizer solution Inhale 3 mLs (1vial) by nebulization every 6 (six) hours as needed for wheezing or shortness of breath.   albuterol (VENTOLIN HFA) 108 (90 Base) MCG/ACT inhaler Inhale 2 puffs into the lungs every 6 (six) hours as needed for wheezing or shortness of breath.   albuterol (VENTOLIN HFA)  108 (90 Base) MCG/ACT inhaler Inhale 2 puffs into the lungs every 6 (six) hours as needed for wheezing or shortness of breath.   Albuterol-Budesonide (AIRSUPRA) 90-80 MCG/ACT AERO Inhale 2 puffs into the lungs every 6 (six) hours.   Blood Glucose Monitoring Suppl (FREESTYLE LITE) DEVI Use to check glucose 3x daily (Patient taking differently: 1 each by Other route 3 (three) times daily. Use to check glucose 3x daily)   budesonide-formoterol (SYMBICORT) 160-4.5 MCG/ACT inhaler Inhale 2 puffs into the lungs in the morning and at bedtime.   calcium-vitamin D (OSCAL WITH D) 500-200 MG-UNIT TABS tablet Take 1 tablet by mouth daily.   carvedilol (COREG) 3.125 MG tablet Take 1 tablet (3.125 mg total) by mouth 2 (two) times daily with a meal.   Cholecalciferol 50 MCG (2000 UT) CAPS Take 1 capsule by mouth daily.   conjugated estrogens (PREMARIN) vaginal cream Place 1 applicatorful vaginally at bedtime for 21 days then off for 7 days.   Cyanocobalamin (VITAMIN B 12) 100 MCG LOZG Take 1 tablet by mouth daily.   DULoxetine (CYMBALTA) 60 MG capsule Take 1 capsule (60 mg total) by mouth daily.   Ferrous Sulfate (SLOW FE PO) Take 1 tablet by mouth daily.    glucose blood (FREESTYLE LITE) test strip Use 1 to check blood glucose 3 times daily. (Patient taking differently: 1 each by Other route See admin instructions. Use 1 to check blood glucose 3 times daily.)   ibuprofen (ADVIL,MOTRIN) 200 MG tablet Take 600 mg by mouth daily as needed for headache or moderate pain.   levothyroxine (SYNTHROID) 25 MCG tablet Take 1 tablet (25 mcg total) by mouth daily.   metFORMIN (GLUCOPHAGE) 500 MG tablet Take 1 tablet (500 mg total) by mouth 2 (two) times daily with a meal.   Multiple Vitamin (MULTIVITAMIN) tablet Take 1 tablet by mouth daily. Reported on 02/26/2015   ondansetron (ZOFRAN) 4 MG tablet Take 1 tablet (4 mg total) by mouth every 8 (eight) hours as needed for nausea or vomiting.   predniSONE (DELTASONE) 20 MG tablet  Take 20 mg by mouth daily with breakfast. 80 mg yesterday and today   rosuvastatin (CRESTOR) 5 MG tablet Take 1 tablet (5 mg total) by mouth daily.   telmisartan (MICARDIS) 40 MG tablet Take 0.5 tablets (20 mg total) by mouth 2 (two) times daily.   tirzepatide (MOUNJARO) 15 MG/0.5ML Pen Inject 15 mg into the skin once a week.   Vitamin D, Ergocalciferol, (DRISDOL) 1.25 MG (50000 UNIT) CAPS capsule Take 1 capsule (50,000 Units total) by mouth every Sunday.   [DISCONTINUED] betamethasone valerate ointment (VALISONE) 0.1 % Apply 1 application topically  2 (two) times daily.   [DISCONTINUED] budesonide-formoterol (SYMBICORT) 80-4.5 MCG/ACT inhaler Inhale 2 puffs into the lungs 2 (two) times daily.   [DISCONTINUED] cyclobenzaprine (FLEXERIL) 10 MG tablet Take 1 tablet (10 mg total) by mouth 3 (three) times daily as needed for muscle spasms.   [DISCONTINUED] doxycycline (VIBRAMYCIN) 100 MG capsule Take 1 capsule (100 mg total) by mouth 2 (two) times daily.   [DISCONTINUED] Efinaconazole 10 % SOLN Apply 1 drop topically daily.     Allergies:   Theophyllines   Social History   Socioeconomic History   Marital status: Married    Spouse name: Sports administrator   Number of children: 0   Years of education: Not on file   Highest education level: Not on file  Occupational History   Occupation: RN  Tobacco Use   Smoking status: Never   Smokeless tobacco: Never  Vaping Use   Vaping status: Never Used  Substance and Sexual Activity   Alcohol use: No    Alcohol/week: 0.0 standard drinks of alcohol   Drug use: No   Sexual activity: Not on file  Other Topics Concern   Not on file  Social History Narrative   Not on file   Social Determinants of Health   Financial Resource Strain: Not on file  Food Insecurity: No Food Insecurity (11/19/2022)   Hunger Vital Sign    Worried About Running Out of Food in the Last Year: Never true    Ran Out of Food in the Last Year: Never true  Transportation Needs: No  Transportation Needs (11/19/2022)   PRAPARE - Administrator, Civil Service (Medical): No    Lack of Transportation (Non-Medical): No  Physical Activity: Insufficiently Active (11/19/2022)   Exercise Vital Sign    Days of Exercise per Week: 2 days    Minutes of Exercise per Session: 40 min  Stress: Stress Concern Present (11/19/2022)   [employer] of Occupational Health - Occupational Stress Questionnaire    Feeling of Stress : Rather much  Social Connections: Not on file     Family History: The patient's family history includes Colon cancer in an other family member; Depression in her mother; Diabetes in her father and maternal grandmother; Heart disease in her father; Hyperlipidemia in her father; Hypertension in her father; Kidney Stones in her father; Liver disease in her maternal aunt and mother; Obesity in her brother and mother; Stroke in her father; Thyroid disease in her mother and sister. There is no history of Esophageal cancer, Pancreatic cancer, Kidney disease, Colon polyps, Rectal cancer, or Stomach cancer. ROS:   Please see the history of present illness.    All 14 point review of systems negative except as described per history of present illness.  EKGs/Labs/Other Studies Reviewed:    The following studies were reviewed today:   EKG:  EKG Interpretation Date/Time:  Thursday December 17 2022 14:59:45 EST Ventricular Rate:  82 PR Interval:  142 QRS Duration:  112 QT Interval:  388 QTC Calculation: 453 R Axis:   15  Text Interpretation: Normal sinus rhythm Low voltage QRS Right bundle branch block Abnormal ECG When compared with ECG of 30-Nov-2022 10:55, No significant change was found Confirmed by Gypsy Balsam [phone]) on 12/17/2022 3:04:26 PM    Recent Labs: 11/16/2022: ALT 11; TSH 2.56 11/30/2022: B Natriuretic Peptide 87.6; BUN 18; Creatinine, Ser 1.28; Potassium 5.0; Sodium 135 12/14/2022: Hemoglobin 11.6; Platelets 342  Recent Lipid  Panel    Component Value Date/Time  CHOL 232 (H) 11/16/2022 1223   TRIG 77 11/16/2022 1223   HDL 81 11/16/2022 1223   CHOLHDL 2.9 11/16/2022 1223   VLDL 10 03/05/2017 1903   LDLCALC 134 (H) 11/16/2022 1223    Physical Exam:    VS:  BP 130/78 (BP Location: Left Arm, Patient Position: Sitting)   Pulse 82   Ht 5\' 3"  (1.6 m)   Wt 136 lb 9.6 oz (62 kg)   LMP 05/11/2010   SpO2 97%   BMI 24.20 kg/m     Wt Readings from Last 3 Encounters:  12/17/22 136 lb 9.6 oz (62 kg)  12/02/22 140 lb (63.5 kg)  11/19/22 134 lb (60.8 kg)     GEN:  Well nourished, well developed in no acute distress HEENT: Normal NECK: No JVD; No carotid bruits LYMPHATICS: No lymphadenopathy CARDIAC: RRR, no murmurs, no rubs, no gallops RESPIRATORY:  Clear to auscultation without rales, wheezing or rhonchi  ABDOMEN: Soft, non-tender, non-distended MUSCULOSKELETAL:  No edema; No deformity  SKIN: Warm and dry NEUROLOGIC:  Alert and oriented x 3 PSYCHIATRIC:  Normal affect   ASSESSMENT:    1. Essential hypertension   2. Coronary artery disease involving native coronary artery of native heart without angina pectoris   3. Type 2 diabetes mellitus with stage 3a chronic kidney disease, without long-term current use of insulin (HCC)   4. H/O gastric bypass    PLAN:    In order of problems listed above:  Coronary artery disease nonobstructive based on cardiac catheterization from 2007 however she Multiple years of poorly controlled diabetes and hypertension a more about progression of the problem.  I will schedule her to have exercise Cardiolite to see if she get any obstructive disease. Dyspnea on exertion: Ask her to have an echocardiogram done she is already scheduled. Type 2 diabetes I did have long discussion with her about significance of this finding.  She need to start taking antiplatelets therapy as well as statin. Dyslipidemia I did review her K PN her LDL is 13 4, HDL 81.  She does have prescription  for Crestor 5 but did not initiate this therapy yet.  I told her she get at least 3 years and to be on this medication versus diabetes second is the fact that she does have proven coronary disease by cardiac catheterization from 2007 and 36 but simply cholesterol still high.  She agreed to take that medication. I will schedule her to have exercise Cardiolite basically to rule out obstructive disease even though her symptoms not typical more about angina equivalent.   Medication Adjustments/Labs and Tests Ordered: Current medicines are reviewed at length with the patient today.  Concerns regarding medicines are outlined above.  Orders Placed This Encounter  Procedures   EKG 12-Lead   No orders of the defined types were placed in this encounter.   Signed, Georgeanna Lea, MD, [facility]. 12/17/2022 3:30 PM    [facility]

## 2022-12-17 NOTE — Addendum Note (Signed)
Addended by: Baldo Ash D on: 12/17/2022 03:47 PM   Modules accepted: Orders

## 2022-12-17 NOTE — Patient Instructions (Addendum)
Medication Instructions:   START: Aspirin 81mg  1 tablet daily   Lab Work: None Ordered If you have labs (blood work) drawn today and your tests are completely normal, you will receive your results only by: MyChart Message (if you have MyChart) OR A paper copy in the mail If you have any lab test that is abnormal or we need to change your treatment, we will call you to review the results.   Testing/Procedures: Your physician has requested that you have a lexiscan myoview. Exercise Cardiolite. For further information please visit [email or web address] Please follow instruction sheet, as given.  The test will take approximately 3 to 4 hours to complete; you may bring reading material.  If someone comes with you to your appointment, they will need to remain in the main lobby due to limited space in the testing area. appointment**  How to prepare for your Myocardial Perfusion Test: Do not eat or drink 3 hours prior to your test, except you may have water. Do not consume products containing caffeine (regular or decaffeinated) 12 hours prior to your test. (ex: coffee, chocolate, sodas, tea). Do bring a list of your current medications with you.  If not listed below, you may take your medications as normal. Do wear comfortable clothes (no dresses or overalls) and walking shoes, tennis shoes preferred (No heels or open toe shoes are allowed). Do NOT wear cologne, perfume, aftershave, or lotions (deodorant is allowed). If these instructions are not followed, your test will have to be rescheduled.     Follow-Up: At [facility], you and your health needs are our priority.  As part of our continuing mission to provide you with exceptional heart care, we have created designated Provider Care Teams.  These Care Teams include your primary Cardiologist (physician) and Advanced Practice Providers (APPs -  Physician Assistants and Nurse Practitioners) who all work together to provide you with the care you  need, when you need it.  We recommend signing up for the patient portal called "MyChart".  Sign up information is provided on this After Visit Summary.  MyChart is used to connect with patients for Virtual Visits (Telemedicine).  Patients are able to view lab/test results, encounter notes, upcoming appointments, etc.  Non-urgent messages can be sent to your provider as well.   To learn more about what you can do with MyChart, go to [email or web address].    Your next appointment:   2 month(s)  The format for your next appointment:   In Person  Provider:   Gypsy Balsam, MD    Other Instructions NA

## 2022-12-28 ENCOUNTER — Ambulatory Visit: Payer: 59 | Attending: Cardiology

## 2022-12-28 DIAGNOSIS — R0602 Shortness of breath: Secondary | ICD-10-CM

## 2022-12-28 LAB — ECHOCARDIOGRAM COMPLETE: S' Lateral: 2.4 cm

## 2022-12-28 NOTE — Addendum Note (Signed)
Addended by: Gypsy Balsam on: 12/28/2022 10:31 AM   Modules accepted: Orders

## 2022-12-29 ENCOUNTER — Other Ambulatory Visit (HOSPITAL_COMMUNITY): Payer: Self-pay

## 2022-12-30 ENCOUNTER — Encounter: Payer: Self-pay | Admitting: Cardiology

## 2022-12-30 ENCOUNTER — Other Ambulatory Visit (HOSPITAL_COMMUNITY): Payer: Self-pay

## 2022-12-30 ENCOUNTER — Telehealth (HOSPITAL_COMMUNITY): Payer: Self-pay | Admitting: *Deleted

## 2022-12-30 ENCOUNTER — Other Ambulatory Visit: Payer: Self-pay

## 2022-12-30 ENCOUNTER — Other Ambulatory Visit: Payer: Self-pay | Admitting: Emergency Medicine

## 2022-12-30 DIAGNOSIS — I10 Essential (primary) hypertension: Secondary | ICD-10-CM

## 2022-12-30 MED ORDER — CARVEDILOL 3.125 MG PO TABS
3.1250 mg | ORAL_TABLET | Freq: Two times a day (BID) | ORAL | 3 refills | Status: AC
  Filled 2022-12-30: qty 180, 90d supply, fill #0

## 2022-12-30 MED ORDER — TELMISARTAN 40 MG PO TABS
20.0000 mg | ORAL_TABLET | Freq: Two times a day (BID) | ORAL | 3 refills | Status: AC
  Filled 2022-12-30: qty 90, 90d supply, fill #0

## 2022-12-30 MED ORDER — ROSUVASTATIN CALCIUM 5 MG PO TABS
5.0000 mg | ORAL_TABLET | Freq: Every day | ORAL | 3 refills | Status: AC
  Filled 2022-12-30: qty 90, 90d supply, fill #0

## 2022-12-30 NOTE — Telephone Encounter (Signed)
Patient given detailed instructions per Myocardial Perfusion Study Information Sheet for the test on 01/06/23 at 8:00. Patient notified to arrive 15 minutes early and that it is imperative to arrive on time for appointment to keep from having the test rescheduled.  If you need to cancel or reschedule your appointment, please call the office within 24 hours of your appointment. . Patient verbalized understanding.Alexa [name]

## 2022-12-31 ENCOUNTER — Encounter: Payer: Self-pay | Admitting: Pulmonary Disease

## 2022-12-31 ENCOUNTER — Other Ambulatory Visit (HOSPITAL_COMMUNITY): Payer: Self-pay

## 2022-12-31 ENCOUNTER — Ambulatory Visit: Payer: 59 | Admitting: Pulmonary Disease

## 2022-12-31 VITALS — BP 110/60 | HR 80 | Temp 97.8°F | Ht 63.0 in | Wt 139.8 lb

## 2022-12-31 DIAGNOSIS — E559 Vitamin D deficiency, unspecified: Secondary | ICD-10-CM | POA: Diagnosis not present

## 2022-12-31 DIAGNOSIS — I10 Essential (primary) hypertension: Secondary | ICD-10-CM | POA: Diagnosis not present

## 2022-12-31 DIAGNOSIS — R0602 Shortness of breath: Secondary | ICD-10-CM

## 2022-12-31 DIAGNOSIS — E1165 Type 2 diabetes mellitus with hyperglycemia: Secondary | ICD-10-CM | POA: Diagnosis not present

## 2022-12-31 DIAGNOSIS — Z9884 Bariatric surgery status: Secondary | ICD-10-CM | POA: Diagnosis not present

## 2022-12-31 DIAGNOSIS — F324 Major depressive disorder, single episode, in partial remission: Secondary | ICD-10-CM | POA: Diagnosis not present

## 2022-12-31 DIAGNOSIS — J4540 Moderate persistent asthma, uncomplicated: Secondary | ICD-10-CM

## 2022-12-31 DIAGNOSIS — F411 Generalized anxiety disorder: Secondary | ICD-10-CM | POA: Diagnosis not present

## 2022-12-31 DIAGNOSIS — N183 Chronic kidney disease, stage 3 unspecified: Secondary | ICD-10-CM | POA: Diagnosis not present

## 2022-12-31 DIAGNOSIS — N2581 Secondary hyperparathyroidism of renal origin: Secondary | ICD-10-CM | POA: Diagnosis not present

## 2022-12-31 DIAGNOSIS — F4310 Post-traumatic stress disorder, unspecified: Secondary | ICD-10-CM | POA: Diagnosis not present

## 2022-12-31 LAB — NITRIC OXIDE: Nitric Oxide: 5

## 2022-12-31 MED ORDER — BUDESONIDE-FORMOTEROL FUMARATE 160-4.5 MCG/ACT IN AERO: 2.0000 | INHALATION_SPRAY | Freq: Two times a day (BID) | RESPIRATORY_TRACT | 3 refills | Status: AC

## 2022-12-31 NOTE — Progress Notes (Signed)
Synopsis: Referred in by Margaree Mackintosh, MD   Subjective:   PATIENT ID: Alexa [name] GENDER: female DOB: 21-Sep-1960, MRN: [id number]  Chief Complaint  Patient presents with   Follow-up    Shortness of breath on exertion and occasional at rest.     HPI Alexa [name] is a pleasant 62 year old female patient with a past medical history of mild intermittent asthma, hypertension, hyperlipidemia presenting today to the pulmonary clinic for further evaluation of her asthma.  She was diagnosed with asthma as a child and has been on albuterol as needed.  About a year ago she contracted COVID and her asthma has worsened.  [employer] she has encountered COVID again and required 2 rounds of steroids after which she felt better for short period of time up until 3 months ago.  She describes her symptoms as chest tightness and inability to take in deep breaths.  She denies any chest pain.  She also reports significant shortness of breath on exertion and she needs to sit down for 10 to 15 minutes that she cannot catch her breath.  As a result she had an ED visit on 11/4, ECG with new incomplete right bundle branch block.  High-sensitivity troponin and BNP was within normal limit.  CTA of the chest ruled out PE but shows mild diffuse bilateral bronchial wall thickening.  PFTs 11/07 were normal   Allergen panel negative. EOS 100 and FENO 5.   Echocardiogram 12/28/2022 LVEF 60 - 65%, grade I diastolic dysfunction, PASP normal - no valvular disease. Pending Nuclear stress test next week.    Started her on symbicort 4 weeks ago which has somewhat improved her breathing but does not think she is back to baseline yet. Continue to use Airsupra few times a week.   Family history: No family history of asthma   Social history:  Never\ smoker, denies alcohol or illicit drug use. Works as a Orthoptist.   ROS All systems were reviewed and are negative except for the above.  Objective:   Vitals:   12/31/22  1459  BP: 110/60  Pulse: 80  Temp: 97.8 F (36.6 C)  TempSrc: Temporal  SpO2: 100%  Weight: 139 lb 12.8 oz (63.4 kg)  Height: 5\' 3"  (1.6 m)   100% on RA BMI Readings from Last 3 Encounters:  12/31/22 24.76 kg/m  12/17/22 24.20 kg/m  12/02/22 24.80 kg/m   Wt Readings from Last 3 Encounters:  12/31/22 139 lb 12.8 oz (63.4 kg)  12/17/22 136 lb 9.6 oz (62 kg)  12/02/22 140 lb (63.5 kg)    Physical Exam GEN: NAD, Healthy Appearing HEENT: Supple Neck, Reactive Pupils, EOMI  CVS: Normal S1, Normal S2, RRR, No murmurs or ES appreciated  Lungs: Clear bilateral air entry.  Abdomen: Soft, non tender, non distended, + BS  Extremities: Warm and well perfused, No edema  Skin: No suspicious lesions appreciated  Psych: Normal Affect  Labs and imaging were reviewed.  Ancillary Information   CBC    Component Value Date/Time   WBC 4.8 12/14/2022 1052   WBC 5.2 11/30/2022 1058   RBC 4.34 12/14/2022 1052   RBC 4.52 11/30/2022 1058   HGB 11.6 12/14/2022 1052   HCT 36.0 12/14/2022 1052   PLT 342 12/14/2022 1052   MCV 83 12/14/2022 1052   MCH 26.7 12/14/2022 1052   MCH 26.5 11/30/2022 1058   MCHC 32.2 12/14/2022 1052   MCHC 32.6 11/30/2022 1058   RDW 13.6 12/14/2022 1052   LYMPHSABS  2.1 12/14/2022 1052   MONOABS 0.8 10/19/2015 1327   EOSABS 0.1 12/14/2022 1052   BASOSABS 0.1 12/14/2022 1052    Labs and imaging were reviewed.     Latest Ref Rng & Units 12/03/2022   10:57 AM  PFT Results  FVC-Pre L 3.12   FVC-Predicted Pre % 98   FVC-Post L 3.12   FVC-Predicted Post % 98   Pre FEV1/FVC % % 82   Post FEV1/FCV % % 82   FEV1-Pre L 2.55   FEV1-Predicted Pre % 104   FEV1-Post L 2.57   DLCO uncorrected ml/min/mmHg 17.92   DLCO UNC% % 92   DLCO corrected ml/min/mmHg 18.78   DLCO COR %Predicted % 96   DLVA Predicted % 109   TLC L 5.45   TLC % Predicted % 111   RV % Predicted % 122      Assessment & Plan:  Alexa [name] is a pleasant 62 year old female patient with a  past medical history of mild intermittent asthma, hypertension, hyperlipidemia presenting today to the pulmonary clinic for further evaluation of her asthma.  #Moderate to severe persistent asthma  FENO 5.  ACT 15  EOS peak 170  IgE 8  No allergen identified.  PFTs 2024 normal   Some improvement w/ symbicort but not back to baseline. Will continue on same dose for now and once CAD is ruled out, we ll decide on escalating therapy.   []  c/w budesonide-formoterol [Symbicort] to 160-4.52 puffs twice a day. []  c/w budesonide-albuterol [Airsupra] 80-92 puffs every 6 hours as needed.  #New incomplete RBBB   Echocardiogram 12/28/2022 LVEF 60 - 65%, grade I diastolic dysfunction, PASP normal - no valvular disease. Pending Nuclear stress test next week.  I think her symptoms are mostly from her uncontrolled asthma in the setting of contracting COVID-19 6 months ago.  She does express significant dyspnea on exertion.  Her ECG in in the ED showed a new incomplete right bundle branch block, she had 20 to 30% narrowing of LAD ostially and 30 to 40% mid LAD stenosis. Pending Nuclear stress test.    []  Pending nuclear stress test 01/06/2023\   Return in about 3 months (around 03/31/2023).  I spent 35 minutes caring for this patient today, including preparing to see the patient, obtaining a medical history , reviewing a separately obtained history, performing a medically appropriate examination and/or evaluation, counseling and educating the patient/family/caregiver, ordering medications, tests, or procedures, documenting clinical information in the electronic health record, and independently interpreting results (not separately reported/billed) and communicating results to the patient/family/caregiver  Janann Colonel, MD [facility] 12/31/2022 3:31 PM

## 2023-01-02 ENCOUNTER — Other Ambulatory Visit (HOSPITAL_COMMUNITY): Payer: Self-pay

## 2023-01-02 MED ORDER — MOUNJARO 7.5 MG/0.5ML ~~LOC~~ SOAJ
7.5000 mg | SUBCUTANEOUS | 5 refills | Status: DC
  Filled 2023-01-02 (×2): qty 2, 28d supply, fill #0

## 2023-01-04 ENCOUNTER — Other Ambulatory Visit: Payer: Self-pay

## 2023-01-04 ENCOUNTER — Other Ambulatory Visit (HOSPITAL_COMMUNITY): Payer: Self-pay

## 2023-01-06 ENCOUNTER — Ambulatory Visit: Payer: 59 | Attending: Cardiology

## 2023-01-06 DIAGNOSIS — R0609 Other forms of dyspnea: Secondary | ICD-10-CM | POA: Diagnosis not present

## 2023-01-06 MED ORDER — TECHNETIUM TC 99M TETROFOSMIN IV KIT
11.0000 | PACK | Freq: Once | INTRAVENOUS | Status: AC | PRN
  Administered 2023-01-06: 11 via INTRAVENOUS

## 2023-01-06 MED ORDER — TECHNETIUM TC 99M TETROFOSMIN IV KIT
28.8000 | PACK | Freq: Once | INTRAVENOUS | Status: AC | PRN
Start: 2023-01-06 — End: 2023-01-06
  Administered 2023-01-06: 28.8 via INTRAVENOUS

## 2023-01-07 LAB — MYOCARDIAL PERFUSION IMAGING
Estimated workload: 7
Exercise duration (min): 5 min
Exercise duration (sec): 15 s
LV dias vol: 56 mL (ref 46–106)
LV sys vol: 20 mL
MPHR: 159 {beats}/min
Nuc Stress EF: 64 %
Peak HR: 148 {beats}/min
Percent HR: 93 %
Rest HR: 68 {beats}/min
Rest Nuclear Isotope Dose: 11 mCi
SDS: 0
SRS: 1
SSS: 1
Stress Nuclear Isotope Dose: 28.8 mCi
TID: 0.85

## 2023-01-13 DIAGNOSIS — F39 Unspecified mood [affective] disorder: Secondary | ICD-10-CM | POA: Diagnosis not present

## 2023-01-15 ENCOUNTER — Ambulatory Visit: Payer: 59 | Admitting: Cardiovascular Disease

## 2023-01-18 ENCOUNTER — Telehealth: Payer: Self-pay

## 2023-01-18 NOTE — Telephone Encounter (Signed)
 Left message on My Chart with normal Stress test results per Dr. Vanetta Shawl note. Routed to PCP.

## 2023-01-25 ENCOUNTER — Ambulatory Visit (INDEPENDENT_AMBULATORY_CARE_PROVIDER_SITE_OTHER): Payer: 59 | Admitting: Family Medicine

## 2023-01-25 ENCOUNTER — Other Ambulatory Visit (HOSPITAL_COMMUNITY): Payer: Self-pay

## 2023-01-25 ENCOUNTER — Encounter (INDEPENDENT_AMBULATORY_CARE_PROVIDER_SITE_OTHER): Payer: Self-pay | Admitting: Family Medicine

## 2023-01-25 VITALS — BP 145/81 | HR 71 | Temp 97.7°F | Ht 63.0 in | Wt 133.0 lb

## 2023-01-25 DIAGNOSIS — Z6823 Body mass index (BMI) 23.0-23.9, adult: Secondary | ICD-10-CM | POA: Diagnosis not present

## 2023-01-25 DIAGNOSIS — E119 Type 2 diabetes mellitus without complications: Secondary | ICD-10-CM | POA: Diagnosis not present

## 2023-01-25 DIAGNOSIS — I10 Essential (primary) hypertension: Secondary | ICD-10-CM

## 2023-01-25 DIAGNOSIS — I5189 Other ill-defined heart diseases: Secondary | ICD-10-CM

## 2023-01-25 DIAGNOSIS — E669 Obesity, unspecified: Secondary | ICD-10-CM

## 2023-01-25 DIAGNOSIS — N1831 Type 2 diabetes mellitus with diabetic chronic kidney disease: Secondary | ICD-10-CM

## 2023-01-25 MED ORDER — TIRZEPATIDE 15 MG/0.5ML ~~LOC~~ SOAJ
15.0000 mg | SUBCUTANEOUS | 0 refills | Status: DC
  Filled 2023-01-25: qty 6, 84d supply, fill #0

## 2023-01-25 NOTE — Progress Notes (Signed)
.smr  Office: [phone]  /  Fax: [phone]  WEIGHT SUMMARY AND BIOMETRICS  Anthropometric Measurements Height: 5\' 3"  (1.6 m) Weight: 133 lb (60.3 kg) BMI (Calculated): 23.57 Weight at Last Visit: 134 lb Weight Lost Since Last Visit: 1 lb Weight Gained Since Last Visit: 0 Starting Weight: 169 lb Total Weight Loss (lbs): 36 lb (16.3 kg)   Body Composition  Body Fat %: 36.6 % Fat Mass (lbs): 48.8 lbs Muscle Mass (lbs): 80.2 lbs Total Body Water (lbs): 58.6 lbs Visceral Fat Rating : 8   Other Clinical Data Fasting: No Labs: No Today's Visit #: 41 Starting Date: 11/16/17    Chief Complaint: OBESITY   History of Present Illness   The patient, with a history of type 2 diabetes, obesity, and hypertension, presents for a follow-up visit after a recent switch from Ozempic to [facility] for diabetes management. She reports a weight loss of one pound over the past month, despite the holiday season. However, she admits to not consistently journaling or adhering to a specific eating plan. She engages in walking for exercise 3-4 times per week. Her blood pressure was noted to be elevated at 145/81 on today's visit.  The patient has recently been experiencing shortness of breath, which led to an emergency room visit and subsequent consultations with a pulmonologist and a cardiologist. She underwent an echocardiogram and stress test, both of which were reported as normal. The patient has a history of asthma and has had two episodes of COVID-19, the last one being in February. The shortness of breath started about three months ago and required several rounds of steroids and inhaler use for management. The pulmonologist suggested that this could be a manifestation of long COVID. The patient is currently on two inhalers, Symbicort and a rescue inhaler with albuterol and Pulmicort, and reports improvement in her symptoms.  The patient also reports recent consultations with an endocrinologist  and a change in her diabetes medication from Ozempic to [facility]. She has not yet started the [facility] as she was finishing her supply of Ozempic. She also reports a recent vacation to [place], which she enjoyed.          PHYSICAL EXAM:  Blood pressure (!) 145/81, pulse 71, temperature 97.7 F (36.5 C), height 5\' 3"  (1.6 m), weight 133 lb (60.3 kg), last menstrual period 05/11/2010, SpO2 99%. Body mass index is 23.56 kg/m.  DIAGNOSTIC DATA REVIEWED:  BMET    Component Value Date/Time   NA 135 11/30/2022 1058   NA 140 07/22/2022 1231   K 5.0 11/30/2022 1058   CL 103 11/30/2022 1058   CO2 23 11/30/2022 1058   GLUCOSE 126 (H) 11/30/2022 1058   BUN 18 11/30/2022 1058   BUN 19 07/22/2022 1231   CREATININE 1.28 (H) 11/30/2022 1058   CREATININE 1.19 (H) 11/16/2022 1223   CALCIUM 9.2 11/30/2022 1058   CALCIUM 9.3 08/14/2011 1217   GFRNONAA 48 (L) 11/30/2022 1058   GFRNONAA 59 (L) 05/16/2020 1050   GFRAA 69 05/16/2020 1050   Lab Results  Component Value Date   HGBA1C 7.6 (H) 11/16/2022   HGBA1C 6.6 06/09/2010   Lab Results  Component Value Date   INSULIN 8.1 11/16/2017   Lab Results  Component Value Date   TSH 2.56 11/16/2022   CBC    Component Value Date/Time   WBC 4.8 12/14/2022 1052   WBC 5.2 11/30/2022 1058   RBC 4.34 12/14/2022 1052   RBC 4.52 11/30/2022 1058   HGB 11.6 12/14/2022  1052   HCT 36.0 12/14/2022 1052   PLT 342 12/14/2022 1052   MCV 83 12/14/2022 1052   MCH 26.7 12/14/2022 1052   MCH 26.5 11/30/2022 1058   MCHC 32.2 12/14/2022 1052   MCHC 32.6 11/30/2022 1058   RDW 13.6 12/14/2022 1052   Iron Studies    Component Value Date/Time   IRON 70 10/14/2021 1525   TIBC 357 04/14/2012 1059   IRONPCTSAT 57 (H) 04/14/2012 1059   Lipid Panel     Component Value Date/Time   CHOL 232 (H) 11/16/2022 1223   TRIG 77 11/16/2022 1223   HDL 81 11/16/2022 1223   CHOLHDL 2.9 11/16/2022 1223   VLDL 10 03/05/2017 1903   LDLCALC 134 (H) 11/16/2022 1223    Hepatic Function Panel     Component Value Date/Time   PROT 6.8 11/16/2022 1223   PROT 6.6 07/22/2022 1231   ALBUMIN 4.3 07/22/2022 1231   AST 15 11/16/2022 1223   ALT 11 11/16/2022 1223   ALKPHOS 103 07/22/2022 1231   BILITOT 0.6 11/16/2022 1223   BILITOT 0.3 07/22/2022 1231   BILIDIR 0.1 03/27/2019 1045   IBILI 0.3 03/27/2019 1045      Component Value Date/Time   TSH 2.56 11/16/2022 1223   Nutritional Lab Results  Component Value Date   VD25OH 26.5 (L) 07/22/2022   VD25OH 36 10/14/2021   VD25OH 45 02/05/2021     Assessment and Plan    Type 2 Diabetes Mellitus Switched from Ozempic to [facility] due to suboptimal A1c levels. Advised to start Mounjaro at 15 mg to avoid worsening blood sugar control. Discussed potential benefits including improved glycemic control and weight loss, with possible discontinuation of metformin if effective. - Start Mounjaro 15 mg - Follow-up at the end of January for A1c and vitamin levels  Hypertension Blood pressure elevated at 145/81, contributing to grade one diastolic dysfunction. Emphasized strict blood pressure control and medication adherence to prevent progression and improve diastolic function. - Ensure adherence to antihypertensive medication - Monitor blood pressure regularly  Grade One Diastolic Dysfunction Attributed to poorly controlled hypertension. Manageable with strict blood pressure control and medication adherence to prevent progression and potentially improve heart function. - Maintain strict blood pressure control - Educate on medication adherence  Obesity in maintenance Lost one pound over the last month. Engaging in walking exercise 30 minutes 3-4 times per week and maintaining protein intake of at least 85 grams per day. Discussed benefits of continued weight loss and exercise for diabetes management and overall health. - Continue walking for exercise 30 minutes 3-4 times per week - Maintain protein intake of at  least 85 grams per day  General Health Maintenance Due for A1c and vitamin level check at the next visit. - Check A1c and vitamin levels at the end of January  Follow-up - Schedule follow-up appointment at the end of January.        She was informed of the importance of frequent follow up visits to maximize her success with intensive lifestyle modifications for her multiple health conditions.    Quillian Quince, MD

## 2023-02-16 ENCOUNTER — Encounter: Payer: Self-pay | Admitting: Cardiology

## 2023-02-16 ENCOUNTER — Ambulatory Visit: Payer: 59 | Attending: Cardiology | Admitting: Cardiology

## 2023-02-16 VITALS — BP 140/80 | HR 80 | Ht 63.0 in | Wt 138.4 lb

## 2023-02-16 DIAGNOSIS — I10 Essential (primary) hypertension: Secondary | ICD-10-CM

## 2023-02-16 DIAGNOSIS — Z9884 Bariatric surgery status: Secondary | ICD-10-CM

## 2023-02-16 DIAGNOSIS — N183 Chronic kidney disease, stage 3 unspecified: Secondary | ICD-10-CM | POA: Insufficient documentation

## 2023-02-16 DIAGNOSIS — E1165 Type 2 diabetes mellitus with hyperglycemia: Secondary | ICD-10-CM | POA: Insufficient documentation

## 2023-02-16 DIAGNOSIS — I2510 Atherosclerotic heart disease of native coronary artery without angina pectoris: Secondary | ICD-10-CM

## 2023-02-16 DIAGNOSIS — J4520 Mild intermittent asthma, uncomplicated: Secondary | ICD-10-CM | POA: Insufficient documentation

## 2023-02-16 DIAGNOSIS — E1159 Type 2 diabetes mellitus with other circulatory complications: Secondary | ICD-10-CM

## 2023-02-16 DIAGNOSIS — I152 Hypertension secondary to endocrine disorders: Secondary | ICD-10-CM

## 2023-02-16 DIAGNOSIS — F324 Major depressive disorder, single episode, in partial remission: Secondary | ICD-10-CM | POA: Insufficient documentation

## 2023-02-16 DIAGNOSIS — E1122 Type 2 diabetes mellitus with diabetic chronic kidney disease: Secondary | ICD-10-CM | POA: Diagnosis not present

## 2023-02-16 DIAGNOSIS — N1831 Chronic kidney disease, stage 3a: Secondary | ICD-10-CM | POA: Diagnosis not present

## 2023-02-16 DIAGNOSIS — Z8616 Personal history of COVID-19: Secondary | ICD-10-CM | POA: Insufficient documentation

## 2023-02-16 NOTE — Progress Notes (Signed)
Cardiology Office Note:    Date:  02/16/2023   ID:  Alexa [name], DOB 04-11-1960, MRN [id number]  PCP:  Margaree Mackintosh, MD  Cardiologist:  Gypsy Balsam, MD    Referring MD: Margaree Mackintosh, MD   Chief Complaint  Patient presents with   Follow-up    History of Present Illness:    Alexa [name] is a 63 y.o. female past medical history significant for dyspnea on exertion, coronary artery disease.  In 2007 she had coronary angiography done which showed 20 to 30% LAD, 30 to 40% mid LAD.  She did have a stress test done just 2 months ago which showed no evidence of ischemia, also history of hypertension type 2 diabetes dyslipidemia obesity status post gastric bypass surgery, peripheral neuropathy, hypothyroidism GERD, asthma.  She was referred to [name] because of shortness of breath she did see pulmonologist nothing permanent has been identified.  We did echocardiogram showed preserved ejection fraction, stress test was negative.  She comes today for follow-up. Overall she says she is doing much better.  Denies have any chest pain tightness squeezing pressure burning chest no palpitation dizziness swelling of lower extremities she thinks she may have had COVID before and that is why she feels short of breath now she is back to baseline  Past Medical History:  Diagnosis Date   Allergy    Anemia, iron deficiency    Anhedonia    Anxiety    Asthma    Asthma, chronic    Back pain    CAD (coronary artery disease)    Cervical spondylosis 06/13/2020   Chewing difficulty    Chronic insomnia    Chronic kidney disease    CKD stage 3 A    Combined hyperlipidemia    Constipation    Depression    Diabetes mellitus    Diabetes mellitus type II    Diabetic autonomic neuropathy (HCC)    Difficulty swallowing pills    DM type 2 with diabetic peripheral neuropathy (HCC)    Dry mouth    Dyspepsia    Dyspnea    Elevated homocysteine    Essential tremor    Fatigue    Fatty liver    GERD  (gastroesophageal reflux disease)    GERD (gastroesophageal reflux disease)    Glossitis    Goiter    Hemorrhoids    HLD (hyperlipidemia)    Hyperparathyroidism , secondary, non-renal (HCC)    Hypertension    Hypocalcemia    Insomnia    Leg edema    Leg pain    Macular degeneration    Nonproliferative diabetic retinopathy associated with type 2 diabetes mellitus (HCC)    RESOLVED   Obesity, Class III, BMI 40-49.9 (morbid obesity) (HCC)    Osteopenia    Osteoporosis    Pallor    PONV (postoperative nausea and vomiting)    Status post gastric surgery    Tachycardia    Thyroiditis, autoimmune    Vaginal dryness, menopausal    Vitamin D deficiency     Past Surgical History:  Procedure Laterality Date   BREAST REDUCTION SURGERY Bilateral 12/14/2017   Procedure: BILATERAL MAMMARY REDUCTION  (BREAST);  Surgeon: Glenna Fellows, MD;  Location: [facility];  Service: Plastics;  Laterality: Bilateral;   CARDIAC CATHETERIZATION     x 2   COLONOSCOPY  09/2012   REDUCTION MAMMAPLASTY  11/2017   ROUX-EN-Y PROCEDURE     TONSILLECTOMY      Current  Medications: Current Meds  Medication Sig   albuterol (PROVENTIL) (2.5 MG/3ML) 0.083% nebulizer solution Inhale 3 mLs (1vial) by nebulization every 6 (six) hours as needed for wheezing or shortness of breath.   albuterol (VENTOLIN HFA) 108 (90 Base) MCG/ACT inhaler Inhale 2 puffs into the lungs every 6 (six) hours as needed for wheezing or shortness of breath.   albuterol (VENTOLIN HFA) 108 (90 Base) MCG/ACT inhaler Inhale 2 puffs into the lungs every 6 (six) hours as needed for wheezing or shortness of breath.   Albuterol-Budesonide (AIRSUPRA) 90-80 MCG/ACT AERO Inhale 2 puffs into the lungs every 6 (six) hours.   Blood Glucose Monitoring Suppl (FREESTYLE LITE) DEVI Use to check glucose 3x daily (Patient taking differently: 1 each by Other route 3 (three) times daily. Use to check glucose 3x daily)   budesonide-formoterol  (SYMBICORT) 160-4.5 MCG/ACT inhaler Inhale 2 puffs into the lungs in the morning and at bedtime.   calcium-vitamin D (OSCAL WITH D) 500-200 MG-UNIT TABS tablet Take 1 tablet by mouth daily.   carvedilol (COREG) 3.125 MG tablet Take 1 tablet (3.125 mg total) by mouth 2 (two) times daily with a meal.   Cholecalciferol 50 MCG (2000 UT) CAPS Take 1 capsule by mouth daily.   conjugated estrogens (PREMARIN) vaginal cream Place 1 applicatorful vaginally at bedtime for 21 days then off for 7 days.   Cyanocobalamin (VITAMIN B 12) 100 MCG LOZG Take 1 tablet by mouth daily.   DULoxetine (CYMBALTA) 60 MG capsule Take 1 capsule (60 mg total) by mouth daily.   Ferrous Sulfate (SLOW FE PO) Take 1 tablet by mouth daily.    glucose blood (FREESTYLE LITE) test strip Use 1 to check blood glucose 3 times daily. (Patient taking differently: 1 each by Other route See admin instructions. Use 1 to check blood glucose 3 times daily.)   ibuprofen (ADVIL,MOTRIN) 200 MG tablet Take 600 mg by mouth daily as needed for headache or moderate pain.   levothyroxine (SYNTHROID) 25 MCG tablet Take 1 tablet (25 mcg total) by mouth daily.   metFORMIN (GLUCOPHAGE) 500 MG tablet Take 1 tablet (500 mg total) by mouth 2 (two) times daily with a meal.   Multiple Vitamin (MULTIVITAMIN) tablet Take 1 tablet by mouth daily. Reported on 02/26/2015   ondansetron (ZOFRAN) 4 MG tablet Take 1 tablet (4 mg total) by mouth every 8 (eight) hours as needed for nausea or vomiting.   rosuvastatin (CRESTOR) 5 MG tablet Take 1 tablet (5 mg total) by mouth daily.   telmisartan (MICARDIS) 40 MG tablet Take 1/2 tablet (20 mg total) by mouth 2 (two) times daily.   tirzepatide (MOUNJARO) 15 MG/0.5ML Pen Inject 15 mg into the skin once a week.   Vitamin D, Ergocalciferol, (DRISDOL) 1.25 MG (50000 UNIT) CAPS capsule Take 1 capsule (50,000 Units total) by mouth every Sunday.     Allergies:   Theophyllines   Social History   Socioeconomic History   Marital  status: Married    Spouse name: Sports administrator   Number of children: 0   Years of education: Not on file   Highest education level: Not on file  Occupational History   Occupation: RN  Tobacco Use   Smoking status: Never   Smokeless tobacco: Never  Vaping Use   Vaping status: Never Used  Substance and Sexual Activity   Alcohol use: No    Alcohol/week: 0.0 standard drinks of alcohol   Drug use: No   Sexual activity: Not on file  Other Topics  Concern   Not on file  Social History Narrative   Not on file   Social Drivers of Health   Financial Resource Strain: Not on file  Food Insecurity: No Food Insecurity (11/19/2022)   Hunger Vital Sign    Worried About Running Out of Food in the Last Year: Never true    Ran Out of Food in the Last Year: Never true  Transportation Needs: No Transportation Needs (11/19/2022)   PRAPARE - Administrator, Civil Service (Medical): No    Lack of Transportation (Non-Medical): No  Physical Activity: Insufficiently Active (11/19/2022)   Exercise Vital Sign    Days of Exercise per Week: 2 days    Minutes of Exercise per Session: 40 min  Stress: Stress Concern Present (11/19/2022)   [employer] of Occupational Health - Occupational Stress Questionnaire    Feeling of Stress : Rather much  Social Connections: Not on file     Family History: The patient's family history includes Colon cancer in an other family member; Depression in her mother; Diabetes in her father and maternal grandmother; Heart disease in her father; Hyperlipidemia in her father; Hypertension in her father; Kidney Stones in her father; Liver disease in her maternal aunt and mother; Obesity in her brother and mother; Stroke in her father; Thyroid disease in her mother and sister. There is no history of Esophageal cancer, Pancreatic cancer, Kidney disease, Colon polyps, Rectal cancer, or Stomach cancer. ROS:   Please see the history of present illness.    All 14 point  review of systems negative except as described per history of present illness  EKGs/Labs/Other Studies Reviewed:         Recent Labs: 11/16/2022: ALT 11; TSH 2.56 11/30/2022: B Natriuretic Peptide 87.6; BUN 18; Creatinine, Ser 1.28; Potassium 5.0; Sodium 135 12/14/2022: Hemoglobin 11.6; Platelets 342  Recent Lipid Panel    Component Value Date/Time   CHOL 232 (H) 11/16/2022 1223   TRIG 77 11/16/2022 1223   HDL 81 11/16/2022 1223   CHOLHDL 2.9 11/16/2022 1223   VLDL 10 03/05/2017 1903   LDLCALC 134 (H) 11/16/2022 1223    Physical Exam:    VS:  BP (!) 140/80 (BP Location: Right Arm, Patient Position: Sitting)   Pulse 80   Ht 5\' 3"  (1.6 m)   Wt 138 lb 6.4 oz (62.8 kg)   LMP 05/11/2010   SpO2 100%   BMI 24.52 kg/m     Wt Readings from Last 3 Encounters:  02/16/23 138 lb 6.4 oz (62.8 kg)  01/25/23 133 lb (60.3 kg)  12/31/22 139 lb 12.8 oz (63.4 kg)     GEN:  Well nourished, well developed in no acute distress HEENT: Normal NECK: No JVD; No carotid bruits LYMPHATICS: No lymphadenopathy CARDIAC: RRR, no murmurs, no rubs, no gallops RESPIRATORY:  Clear to auscultation without rales, wheezing or rhonchi  ABDOMEN: Soft, non-tender, non-distended MUSCULOSKELETAL:  No edema; No deformity  SKIN: Warm and dry LOWER EXTREMITIES: no swelling NEUROLOGIC:  Alert and oriented x 3 PSYCHIATRIC:  Normal affect   ASSESSMENT:    1. Coronary artery disease involving native coronary artery of native heart without angina pectoris   2. Essential hypertension   3. Hypertension associated with type 2 diabetes mellitus (HCC)   4. Type 2 diabetes mellitus with stage 3a chronic kidney disease, without long-term current use of insulin (HCC)   5. S/P gastric bypass    PLAN:    In order of problems listed above:  Coronary disease stable from that point review.  Stress test reviewed with the patient negative.  Continue preventive measures which include antiplatelets therapy. Dyslipidemia  she takes Crestor 5 I did review K PN which show me data from October LDL of 134 HDL 81 we will recheck fasting lipid profile she said she has been taking this medication only for a few weeks so we will do more waiting time before to recheck it. Essential hypertension slightly on the higher side still acceptable.  Ask her to check her blood pressure on the regular basis. Status post gastric bypass surgery noted.   Medication Adjustments/Labs and Tests Ordered: Current medicines are reviewed at length with the patient today.  Concerns regarding medicines are outlined above.  No orders of the defined types were placed in this encounter.  Medication changes: No orders of the defined types were placed in this encounter.   Signed, Georgeanna Lea, MD, [facility] 02/16/2023 3:39 PM    [facility]

## 2023-02-16 NOTE — Addendum Note (Signed)
Addended by: Baldo Ash D on: 02/16/2023 03:52 PM   Modules accepted: Orders

## 2023-02-16 NOTE — Patient Instructions (Signed)
Medication Instructions:  Your physician recommends that you continue on your current medications as directed. Please refer to the Current Medication list given to you today.  *If you need a refill on your cardiac medications before your next appointment, please call your pharmacy*   Lab Work: Your physician recommends that you return for lab work in: 4 weeks You need to have labs done when you are fasting.  You can come Monday through Friday 8:30 am to 12:00 pm and 1:15 to 4:30. You do not need to make an appointment as the order has already been placed. The labs you are going to have done are AST, ALT  Lipids.    Testing/Procedures: None Ordered   Follow-Up: At [facility], you and your health needs are our priority.  As part of our continuing mission to provide you with exceptional heart care, we have created designated Provider Care Teams.  These Care Teams include your primary Cardiologist (physician) and Advanced Practice Providers (APPs -  Physician Assistants and Nurse Practitioners) who all work together to provide you with the care you need, when you need it.  We recommend signing up for the patient portal called "MyChart".  Sign up information is provided on this After Visit Summary.  MyChart is used to connect with patients for Virtual Visits (Telemedicine).  Patients are able to view lab/test results, encounter notes, upcoming appointments, etc.  Non-urgent messages can be sent to your provider as well.   To learn more about what you can do with MyChart, go to [email or web address].    Your next appointment:   6 month(s)  The format for your next appointment:   In Person  Provider:   Gypsy Balsam, MD    Other Instructions NA

## 2023-03-11 ENCOUNTER — Ambulatory Visit (INDEPENDENT_AMBULATORY_CARE_PROVIDER_SITE_OTHER): Payer: 59 | Admitting: Family Medicine

## 2023-03-11 ENCOUNTER — Other Ambulatory Visit (HOSPITAL_COMMUNITY): Payer: Self-pay

## 2023-03-11 VITALS — BP 169/94 | HR 72 | Temp 98.2°F | Ht 63.0 in | Wt 128.0 lb

## 2023-03-11 DIAGNOSIS — Z6822 Body mass index (BMI) 22.0-22.9, adult: Secondary | ICD-10-CM

## 2023-03-11 DIAGNOSIS — E559 Vitamin D deficiency, unspecified: Secondary | ICD-10-CM

## 2023-03-11 DIAGNOSIS — E669 Obesity, unspecified: Secondary | ICD-10-CM

## 2023-03-11 DIAGNOSIS — N1831 Chronic kidney disease, stage 3a: Secondary | ICD-10-CM | POA: Diagnosis not present

## 2023-03-11 DIAGNOSIS — E119 Type 2 diabetes mellitus without complications: Secondary | ICD-10-CM

## 2023-03-11 DIAGNOSIS — Z9884 Bariatric surgery status: Secondary | ICD-10-CM

## 2023-03-11 DIAGNOSIS — I10 Essential (primary) hypertension: Secondary | ICD-10-CM

## 2023-03-11 DIAGNOSIS — M6284 Sarcopenia: Secondary | ICD-10-CM | POA: Diagnosis not present

## 2023-03-11 DIAGNOSIS — E785 Hyperlipidemia, unspecified: Secondary | ICD-10-CM

## 2023-03-11 DIAGNOSIS — E1122 Type 2 diabetes mellitus with diabetic chronic kidney disease: Secondary | ICD-10-CM | POA: Diagnosis not present

## 2023-03-11 DIAGNOSIS — E538 Deficiency of other specified B group vitamins: Secondary | ICD-10-CM | POA: Insufficient documentation

## 2023-03-11 DIAGNOSIS — Z7985 Long-term (current) use of injectable non-insulin antidiabetic drugs: Secondary | ICD-10-CM

## 2023-03-11 DIAGNOSIS — E7849 Other hyperlipidemia: Secondary | ICD-10-CM

## 2023-03-11 MED ORDER — TIRZEPATIDE 15 MG/0.5ML ~~LOC~~ SOAJ
15.0000 mg | SUBCUTANEOUS | 0 refills | Status: DC
  Filled 2023-03-11 – 2023-04-22 (×2): qty 6, 84d supply, fill #0
  Filled 2023-08-27: qty 2, 28d supply, fill #0
  Filled 2023-09-29: qty 2, 28d supply, fill #1
  Filled 2023-11-05: qty 2, 28d supply, fill #2

## 2023-03-11 NOTE — Progress Notes (Signed)
.smr  Office: [phone]  /  Fax: [phone]  WEIGHT SUMMARY AND BIOMETRICS  Anthropometric Measurements Height: 5\' 3"  (1.6 m) Weight: 128 lb (58.1 kg) BMI (Calculated): 22.68 Weight at Last Visit: 133 lb Weight Lost Since Last Visit: 5 lb Weight Gained Since Last Visit: 0 Starting Weight: 169 lb Total Weight Loss (lbs): 41 lb (18.6 kg)   Body Composition  Body Fat %: 38 % Fat Mass (lbs): 48.8 lbs Muscle Mass (lbs): 75.6 lbs Total Body Water (lbs): 59.4 lbs Visceral Fat Rating : 8   Other Clinical Data Fasting: No Labs: Yes Today's Visit #: 42 Starting Date: 11/16/17    Chief Complaint: OBESITY    History of Present Illness   Alexa [name] is a 63 year old female with type 2 diabetes and hyperlipidemia who presents for weight loss maintenance.  She has been working on maintaining her weight loss but she has lost five more pounds over the past two months She is mindful about her diet approximately 75% of the time and engages in walking for exercise, doing so for 30 to 45 minutes three times per week.  She has a history of type 2 diabetes and is currently taking Mounjaro 15 mg. Her last hemoglobin A1c was elevated at 7.6% in October 2024. She experienced bone pain after the first dose of Mounjaro, which she attributes to a possible virus. She also notes an increase in bowel movements, which she considers beneficial due to her chronic constipation.  Her blood pressure is elevated today, indicating uncontrolled hypertension. She acknowledges missing some medication doses and attributes her elevated blood pressure to recent stress, including dealing with identity theft and financial breaches. She experiences occasional chest pain, which she associates with anxiety, and has undergone extensive cardiac workup, including an echocardiogram and stress test, with no significant findings.  She has a history of hyperlipidemia and is on Crestor 5 mg. She is actively working on  diet, exercise, and weight loss to manage her condition.  She works two days a week in twelve-hour shifts and is considering retirement in three years when she turns 31. She and her husband have experienced multiple instances of identity theft, contributing to her stress levels.          PHYSICAL EXAM:  Blood pressure (!) 169/94, pulse 72, temperature 98.2 F (36.8 C), height 5\' 3"  (1.6 m), weight 128 lb (58.1 kg), last menstrual period 05/11/2010, SpO2 100%. Body mass index is 22.67 kg/m.  DIAGNOSTIC DATA REVIEWED:  BMET    Component Value Date/Time   NA 135 11/30/2022 1058   NA 140 07/22/2022 1231   K 5.0 11/30/2022 1058   CL 103 11/30/2022 1058   CO2 23 11/30/2022 1058   GLUCOSE 126 (H) 11/30/2022 1058   BUN 18 11/30/2022 1058   BUN 19 07/22/2022 1231   CREATININE 1.28 (H) 11/30/2022 1058   CREATININE 1.19 (H) 11/16/2022 1223   CALCIUM 9.2 11/30/2022 1058   CALCIUM 9.3 08/14/2011 1217   GFRNONAA 48 (L) 11/30/2022 1058   GFRNONAA 59 (L) 05/16/2020 1050   GFRAA 69 05/16/2020 1050   Lab Results  Component Value Date   HGBA1C 7.6 (H) 11/16/2022   HGBA1C 6.6 06/09/2010   Lab Results  Component Value Date   INSULIN 8.1 11/16/2017   Lab Results  Component Value Date   TSH 2.56 11/16/2022   CBC    Component Value Date/Time   WBC 4.8 12/14/2022 1052   WBC 5.2 11/30/2022 1058   RBC  4.34 12/14/2022 1052   RBC 4.52 11/30/2022 1058   HGB 11.6 12/14/2022 1052   HCT 36.0 12/14/2022 1052   PLT 342 12/14/2022 1052   MCV 83 12/14/2022 1052   MCH 26.7 12/14/2022 1052   MCH 26.5 11/30/2022 1058   MCHC 32.2 12/14/2022 1052   MCHC 32.6 11/30/2022 1058   RDW 13.6 12/14/2022 1052   Iron Studies    Component Value Date/Time   IRON 70 10/14/2021 1525   TIBC 357 04/14/2012 1059   IRONPCTSAT 57 (H) 04/14/2012 1059   Lipid Panel     Component Value Date/Time   CHOL 232 (H) 11/16/2022 1223   TRIG 77 11/16/2022 1223   HDL 81 11/16/2022 1223   CHOLHDL 2.9 11/16/2022  1223   VLDL 10 03/05/2017 1903   LDLCALC 134 (H) 11/16/2022 1223   Hepatic Function Panel     Component Value Date/Time   PROT 6.8 11/16/2022 1223   PROT 6.6 07/22/2022 1231   ALBUMIN 4.3 07/22/2022 1231   AST 15 11/16/2022 1223   ALT 11 11/16/2022 1223   ALKPHOS 103 07/22/2022 1231   BILITOT 0.6 11/16/2022 1223   BILITOT 0.3 07/22/2022 1231   BILIDIR 0.1 03/27/2019 1045   IBILI 0.3 03/27/2019 1045      Component Value Date/Time   TSH 2.56 11/16/2022 1223   Nutritional Lab Results  Component Value Date   VD25OH 26.5 (L) 07/22/2022   VD25OH 36 10/14/2021   VD25OH 45 02/05/2021     Assessment and Plan    Type 2 Diabetes Mellitus Patient is on Mounjaro 15 mg. Last hemoglobin A1c was 7.6% in October 2024. Blood sugar control is a priority. Discussed potential alternative medication that aids in weight loss and blood sugar control if Mounjaro dosage needs to be reduced. - Order hemoglobin A1c - Consider alternative medication if Mounjaro dosage needs to be reduced due to malnutrition  Hypertension Blood pressure is elevated today at 160/83 and 169/94, indicating uncontrolled hypertension. Patient reports missing some medications and experiencing significant stress, which may contribute to elevated readings. Discussed the importance of stress management and regular medication adherence. - Monitor blood pressure at home twice a week - Address stress management strategies  Hyperlipidemia Patient is on Crestor 5 mg and is working on diet, exercise, and weight loss to manage hyperlipidemia. - Continue current medication and lifestyle modifications  Sarcopenia Patient has lost five pounds in the last two months. She is working on increasing protein intake and portion control, and exercises by walking 30-45 minutes three times per week. A significant portion of the weight loss appears to be muscle mass. Discussed the importance of tracking protein intake to ensure adequate muscle  maintenance. Recommended 85 grams of protein and 1500 calories per day. Considered reducing Mounjaro dosage if weight loss continues to be excessive. Discussed alternative medication options if needed. - Track protein intake to ensure at least 85 grams per day and 1500 calories per day - Consider reducing Mounjaro dosage if weight loss continues to be excessive - Refill Mounjaro 15 mg  General Health Maintenance - Order vitamin D and B12 levels - Encourage strength training exercises, including using resistance bands and small weights - Consider chair yoga for low-impact strength training  Follow-up - Schedule follow-up appointment in 4-6 weeks.       She was informed of the importance of frequent follow up visits to maximize her success with intensive lifestyle modifications for her multiple health conditions.    Quillian Quince, MD

## 2023-03-12 LAB — CMP14+EGFR
ALT: 19 [IU]/L (ref 0–32)
AST: 18 [IU]/L (ref 0–40)
Albumin: 4.3 g/dL (ref 3.9–4.9)
Alkaline Phosphatase: 89 [IU]/L (ref 44–121)
BUN/Creatinine Ratio: 10 — ABNORMAL LOW (ref 12–28)
BUN: 13 mg/dL (ref 8–27)
Bilirubin Total: 0.3 mg/dL (ref 0.0–1.2)
CO2: 21 mmol/L (ref 20–29)
Calcium: 9 mg/dL (ref 8.7–10.3)
Chloride: 106 mmol/L (ref 96–106)
Creatinine, Ser: 1.3 mg/dL — ABNORMAL HIGH (ref 0.57–1.00)
Globulin, Total: 2 g/dL (ref 1.5–4.5)
Glucose: 109 mg/dL — ABNORMAL HIGH (ref 70–99)
Potassium: 4.5 mmol/L (ref 3.5–5.2)
Sodium: 143 mmol/L (ref 134–144)
Total Protein: 6.3 g/dL (ref 6.0–8.5)
eGFR: 47 mL/min/{1.73_m2} — ABNORMAL LOW (ref >59)

## 2023-03-12 LAB — HEMOGLOBIN A1C
Est. average glucose Bld gHb Est-mCnc: 160 mg/dL
Hgb A1c MFr Bld: 7.2 % — ABNORMAL HIGH (ref 4.8–5.6)

## 2023-03-12 LAB — VITAMIN B12: Vitamin B-12: 293 pg/mL (ref 232–1245)

## 2023-03-12 LAB — LIPID PANEL WITH LDL/HDL RATIO
Cholesterol, Total: 181 mg/dL (ref 100–199)
HDL: 75 mg/dL (ref >39)
LDL Chol Calc (NIH): 94 mg/dL (ref 0–99)
LDL/HDL Ratio: 1.3 {ratio} (ref 0.0–3.2)
Triglycerides: 66 mg/dL (ref 0–149)
VLDL Cholesterol Cal: 12 mg/dL (ref 5–40)

## 2023-03-12 LAB — VITAMIN D 25 HYDROXY (VIT D DEFICIENCY, FRACTURES): Vit D, 25-Hydroxy: 28.8 ng/mL — ABNORMAL LOW (ref 30.0–100.0)

## 2023-03-16 DIAGNOSIS — H52203 Unspecified astigmatism, bilateral: Secondary | ICD-10-CM | POA: Diagnosis not present

## 2023-03-16 DIAGNOSIS — H353121 Nonexudative age-related macular degeneration, left eye, early dry stage: Secondary | ICD-10-CM | POA: Diagnosis not present

## 2023-03-16 LAB — HM DIABETES EYE EXAM

## 2023-04-01 ENCOUNTER — Ambulatory Visit: Payer: 59 | Admitting: Pulmonary Disease

## 2023-04-22 ENCOUNTER — Other Ambulatory Visit: Payer: Self-pay

## 2023-04-22 ENCOUNTER — Ambulatory Visit (INDEPENDENT_AMBULATORY_CARE_PROVIDER_SITE_OTHER): Payer: 59 | Admitting: Family Medicine

## 2023-04-22 ENCOUNTER — Other Ambulatory Visit (HOSPITAL_COMMUNITY): Payer: Self-pay

## 2023-04-22 DIAGNOSIS — E559 Vitamin D deficiency, unspecified: Secondary | ICD-10-CM | POA: Diagnosis not present

## 2023-04-22 DIAGNOSIS — Z8616 Personal history of COVID-19: Secondary | ICD-10-CM | POA: Diagnosis not present

## 2023-04-22 DIAGNOSIS — N183 Chronic kidney disease, stage 3 unspecified: Secondary | ICD-10-CM | POA: Diagnosis not present

## 2023-04-22 DIAGNOSIS — I10 Essential (primary) hypertension: Secondary | ICD-10-CM | POA: Diagnosis not present

## 2023-04-22 DIAGNOSIS — E1165 Type 2 diabetes mellitus with hyperglycemia: Secondary | ICD-10-CM | POA: Diagnosis not present

## 2023-04-22 DIAGNOSIS — N2581 Secondary hyperparathyroidism of renal origin: Secondary | ICD-10-CM | POA: Diagnosis not present

## 2023-04-22 MED ORDER — MOUNJARO 7.5 MG/0.5ML ~~LOC~~ SOAJ
7.5000 mg | SUBCUTANEOUS | 5 refills | Status: DC
  Filled 2023-04-22: qty 2, 28d supply, fill #0
  Filled 2023-04-22: qty 6, 84d supply, fill #0

## 2023-04-23 ENCOUNTER — Telehealth: Payer: Self-pay | Admitting: Gastroenterology

## 2023-04-23 NOTE — Telephone Encounter (Signed)
 Patient called in with complaints of intermittent, gnawing mid abd pain (4/10) & occasional nausea w/o vomiting and diarrhea for the last 7-10 days. Last bm today, small amount of diarrhea. Yesterday pain was more constant. Patient is concerned since she has a history of roux-en-y surgery in 2008. Pt is seeking further recommendations. Last seen 06/10/21 for colon with Dr. Myrtie Neither.

## 2023-04-23 NOTE — Telephone Encounter (Signed)
 Thank you for the note.  It is difficult to say what this might be, and there are many possibilities.  I am afraid I cannot see her in the office today because I am doing procedures all day.   It concerns me that the symptoms have been going on for a week or so, and I recommend that she be seen at one of the [facility] emergency departments. She lives in [place], so perhaps med [facility] would be closest for her.  Ellwood Dense MD

## 2023-04-23 NOTE — Telephone Encounter (Signed)
 Patient called stated she is having a lot of abdominal pain for about a week now.

## 2023-04-23 NOTE — Telephone Encounter (Signed)
 Pt made aware of MD recommendations.  

## 2023-05-05 IMAGING — MR MR CERVICAL SPINE W/O CM
5 series · 35 of 48 positions shown · non-contrast
Comparison: Report from cervical spine radiographs 01/09/1999
(images unavailable).

CLINICAL DATA: Cervical radiculopathy. Cervical radiculopathy, no
red flags. Additional history provided by scanning technologist:
Patient reports neck and bilateral shoulder pain left side more than
right.

EXAM:
MRI CERVICAL SPINE WITHOUT CONTRAST
TECHNIQUE: Multiplanar, multisequence MR imaging of the cervical spine was
performed. No intravenous contrast was administered.

[Series 2: T2 · sagittal · 3.0mm · 0.41mm/px · 8 of 17 slices shown (1 of 2)]
[im 1/17]
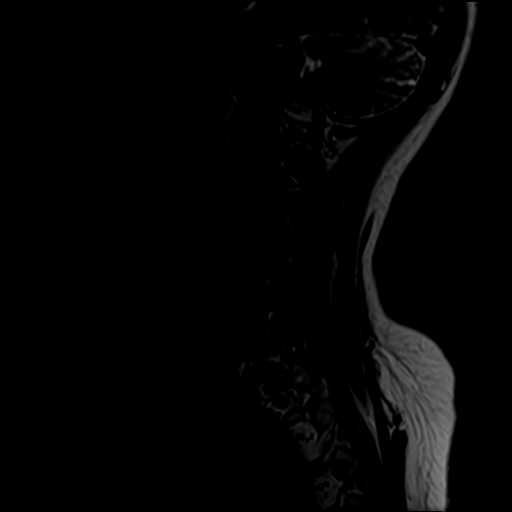
[im 3/17]
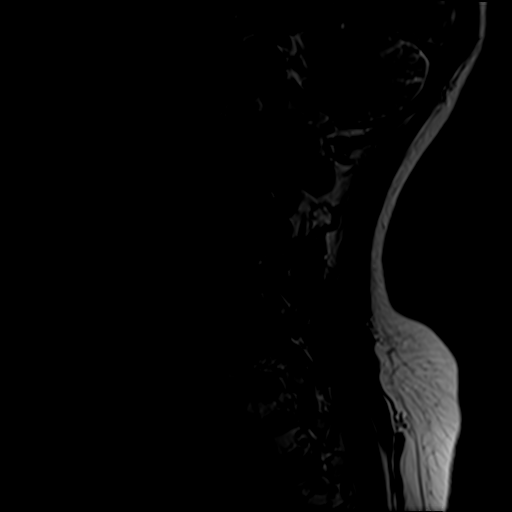
[im 5/17]
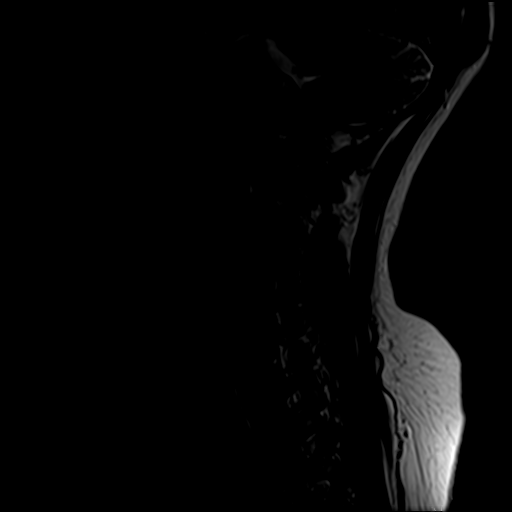
[im 7/17]
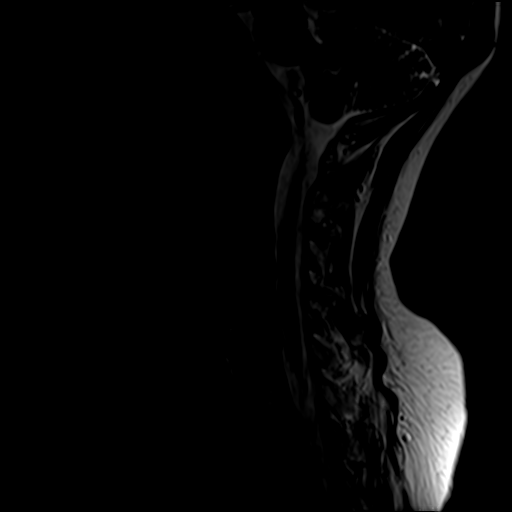
[im 10/17]
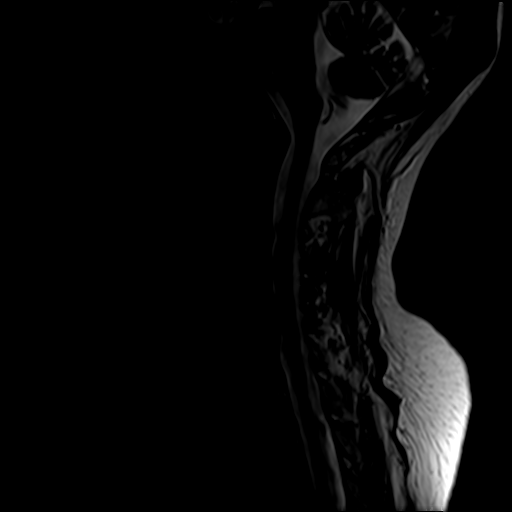
[im 12/17]
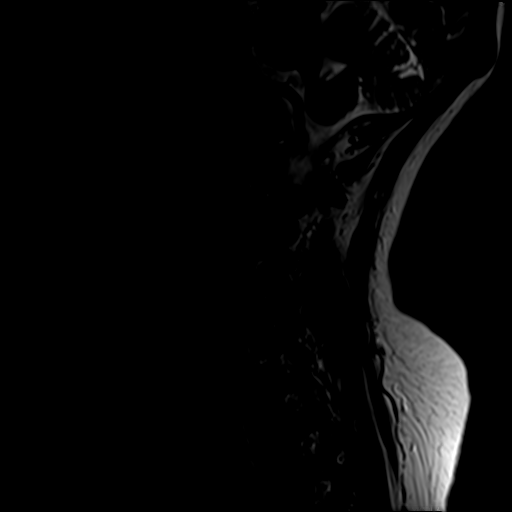
[im 14/17]
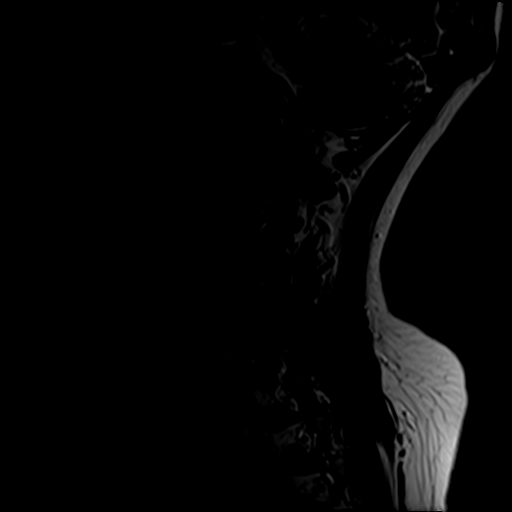
[im 17/17]
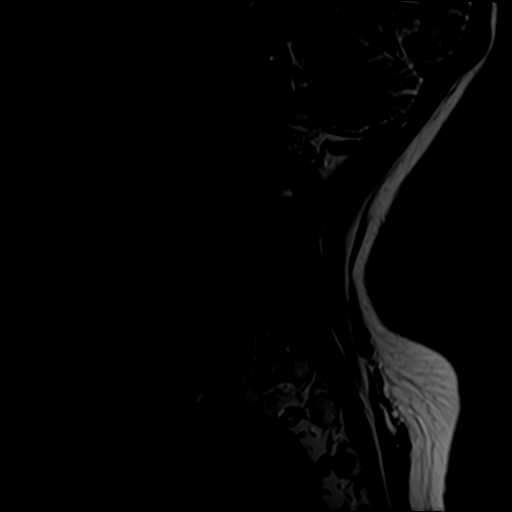

[Series 3: STIR · sagittal · 3.0mm · 0.82mm/px · 8 of 17 slices shown]
[im 1/17]
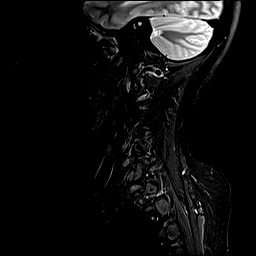
[im 3/17]
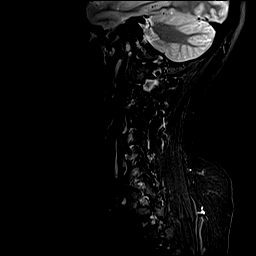
[im 5/17]
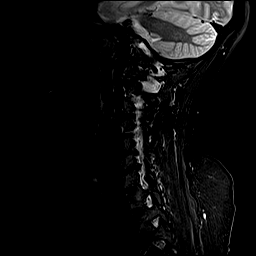
[im 7/17]
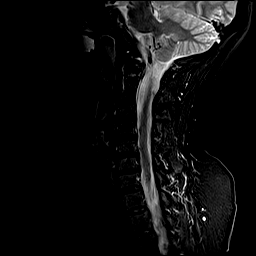
[im 10/17]
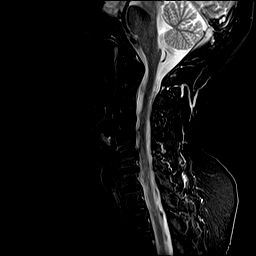
[im 12/17]
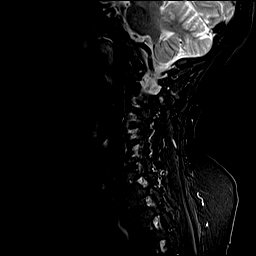
[im 14/17]
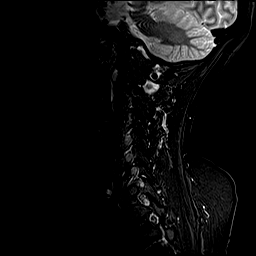
[im 17/17]
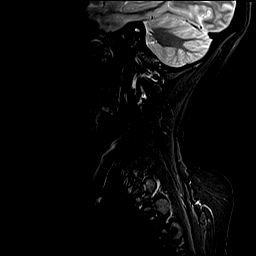

[Series 4: T1 · sagittal · 3.0mm · 0.82mm/px · 8 of 17 slices shown]
[im 1/17]
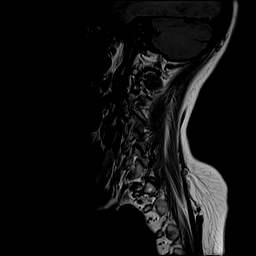
[im 3/17]
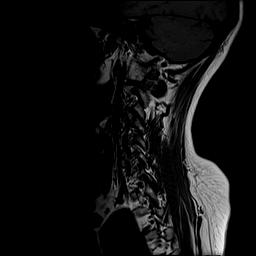
[im 5/17]
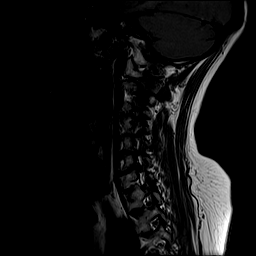
[im 7/17]
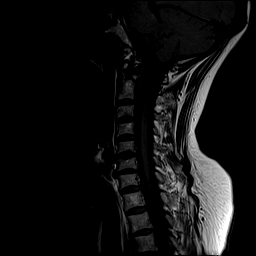
[im 10/17]
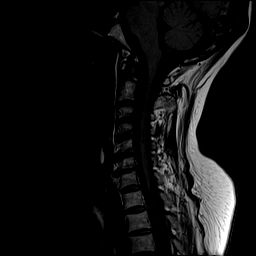
[im 12/17]
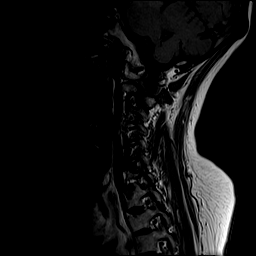
[im 14/17]
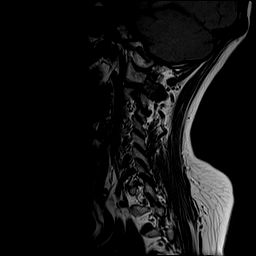
[im 17/17]
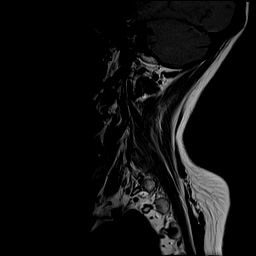

[Series 5: T2 · axial · 3.0mm · 0.70mm/px · z∈[-39,+45]mm · 9 of 24 slices shown (2 of 2)]
[im 1/24]
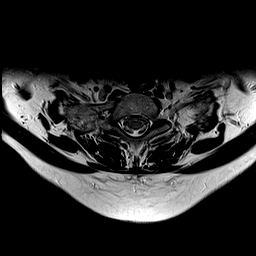
[im 5/24]
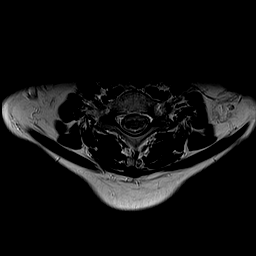
[im 7/24]
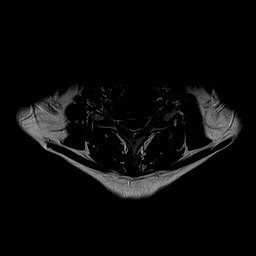
[im 11/24]
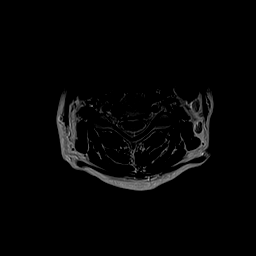
[im 13/24]
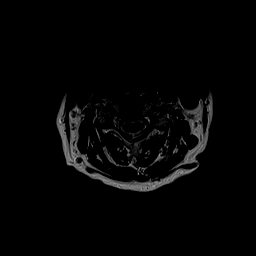
[im 17/24]
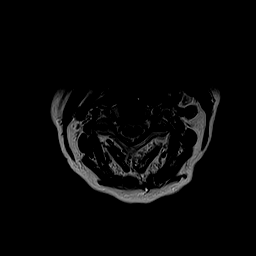
[im 19/24]
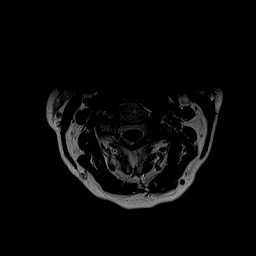
[im 21/24]
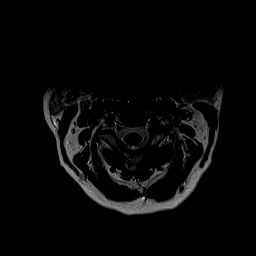
[im 24/24]
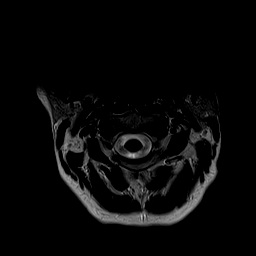

[Series 6: GRE · axial · 3.0mm · 0.35mm/px · z∈[-39,-24]mm · 2 of 24 slices shown]
[im 1/24]
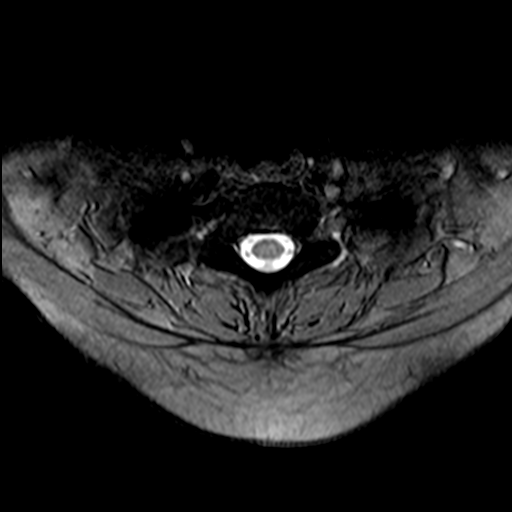
[im 5/24]
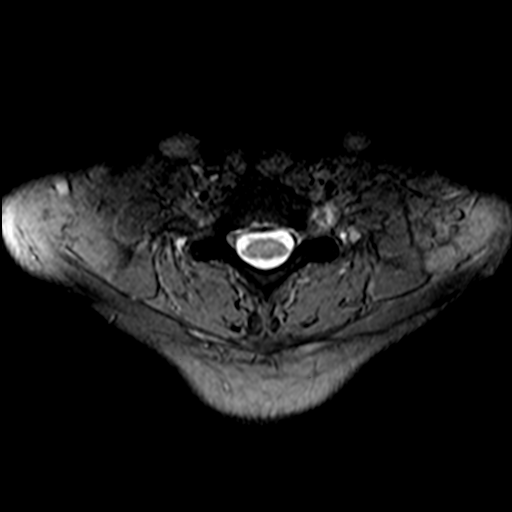

[35 of 48 positions shown; findings below may reference images not displayed]

FINDINGS: Alignment: Straightening of the expected cervical lordosis. No
significant spondylolisthesis.

Vertebrae: Vertebral body height is maintained. No significant
marrow edema or focal suspicious osseous lesion.

Cord: No spinal cord signal abnormality is identified.

Posterior Fossa, vertebral arteries, paraspinal tissues: Partially
empty sella turcica. Flow voids preserved within the imaged cervical
vertebral arteries. Paraspinal soft tissues within normal limits.

Disc levels:

Mild/moderate disc degeneration at C6-C7. No more than mild disc
degeneration at the remaining levels.

C2-C3: No significant disc herniation or spinal canal stenosis. Mild
facet arthrosis on the left with resultant mild relative left neural
foraminal narrowing.

C3-C4: Uncovertebral hypertrophy (greater on the right). Mild facet
arthrosis. No significant spinal canal stenosis. Mild/moderate right
neural foraminal narrowing.

C4-C5: Small central disc protrusion slightly eccentric to the
right. The disc protrusion minimally effaces the ventral thecal sac
without spinal cord mass effect. No significant foraminal stenosis.

C5-C6: Shallow disc bulge. Endplate spurring. Right greater than
left disc osteophyte ridge/uncinate hypertrophy. Minimal partial
effacement of the ventral thecal sac without spinal cord mass
effect. Moderate right neural foraminal narrowing.

C6-C7: Shallow disc bulge. Endplate spurring. Right greater than
left disc osteophyte ridge/uncinate hypertrophy. Minimal partial
effacement of the ventral thecal sac without spinal cord mass
effect. Bilateral neural foraminal narrowing (severe right, moderate
left).

C7-T1: No significant disc herniation or stenosis.
IMPRESSION: Cervical spondylosis as outlined. No more than mild relative spinal
canal narrowing at any level. No spinal cord mass effect. Multilevel
neural foraminal narrowing greatest bilaterally at C6-C7 (severe
right, moderate left) and on the right at C5-C6 (moderate). Disc
degeneration is greatest at C6-C7 (moderate in severity at this
level).

Additional sites of mild and mild/moderate foraminal stenosis, as
above.

## 2023-05-13 DIAGNOSIS — F39 Unspecified mood [affective] disorder: Secondary | ICD-10-CM | POA: Diagnosis not present

## 2023-05-17 ENCOUNTER — Ambulatory Visit: Payer: Self-pay

## 2023-05-17 NOTE — Telephone Encounter (Signed)
 Copied from CRM [id number]. Topic: Clinical - Red Word Triage >> May 17, 2023  7:37 AM Baldemar Lev wrote: Red Word that prompted transfer to Nurse Triage: Wheezing, productive cough  Chief Complaint: wheezing Symptoms: wheezing and cough Frequency: unknown Pertinent Negatives: Patient denies unknown Disposition: [] ED /[] Urgent Care (no appt availability in office) / [x] Appointment(In office/virtual)/ []  [facility]/ [] Home Care/ [] Refused Recommended Disposition /[] [facility]/ []  Follow-up with PCP Additional Notes: declined triage and would like an earlier apt.  Instructed to go to UC, patient angry and hung up.   Reason for Disposition  [1] Caller demands to speak with the PCP AND [2] about sick adult (or sick caller)  Answer Assessment - Initial Assessment Questions 1. SITUATION:  Document reason for call.     Didn't want triage; wanted an appointment with PCP.  Protocols used: Difficult Call-A-AH

## 2023-05-20 NOTE — Telephone Encounter (Signed)
 I called patient and she said she's feeling a little better and is just going to keep appointment for next Tuesday

## 2023-05-24 ENCOUNTER — Encounter (INDEPENDENT_AMBULATORY_CARE_PROVIDER_SITE_OTHER): Payer: Self-pay | Admitting: Family Medicine

## 2023-05-24 ENCOUNTER — Ambulatory Visit (INDEPENDENT_AMBULATORY_CARE_PROVIDER_SITE_OTHER): Admitting: Family Medicine

## 2023-05-24 VITALS — BP 128/81 | HR 82 | Temp 98.2°F | Ht 63.0 in | Wt 121.0 lb

## 2023-05-24 DIAGNOSIS — R112 Nausea with vomiting, unspecified: Secondary | ICD-10-CM | POA: Diagnosis not present

## 2023-05-24 DIAGNOSIS — E119 Type 2 diabetes mellitus without complications: Secondary | ICD-10-CM | POA: Diagnosis not present

## 2023-05-24 DIAGNOSIS — Z6821 Body mass index (BMI) 21.0-21.9, adult: Secondary | ICD-10-CM | POA: Diagnosis not present

## 2023-05-24 DIAGNOSIS — Z794 Long term (current) use of insulin: Secondary | ICD-10-CM

## 2023-05-24 DIAGNOSIS — R110 Nausea: Secondary | ICD-10-CM | POA: Insufficient documentation

## 2023-05-24 DIAGNOSIS — Z9884 Bariatric surgery status: Secondary | ICD-10-CM | POA: Diagnosis not present

## 2023-05-24 DIAGNOSIS — N1831 Chronic kidney disease, stage 3a: Secondary | ICD-10-CM

## 2023-05-24 DIAGNOSIS — E669 Obesity, unspecified: Secondary | ICD-10-CM

## 2023-05-24 DIAGNOSIS — M6284 Sarcopenia: Secondary | ICD-10-CM | POA: Insufficient documentation

## 2023-05-24 NOTE — Progress Notes (Signed)
 Office: [phone]  /  Fax: [phone]  WEIGHT SUMMARY AND BIOMETRICS  Anthropometric Measurements Height: 5\' 3"  (1.6 m) Weight: 121 lb (54.9 kg) BMI (Calculated): 21.44 Weight at Last Visit: 128lb Weight Lost Since Last Visit: 7lb Weight Gained Since Last Visit: 0 Starting Weight: 169lb Total Weight Loss (lbs): 48 lb (21.8 kg)   Body Composition  Body Fat %: 34.4 % Fat Mass (lbs): 41.6 lbs Muscle Mass (lbs): 75.6 lbs Total Body Water (lbs): 56.2 lbs Visceral Fat Rating : 7   Other Clinical Data Fasting: no Labs: no Today's Visit #: 23 Starting Date: 11/16/17    Chief Complaint: OBESITY    History of Present Illness Alexa [name] is a 63 year old female with obesity and type 2 diabetes who presents with unintentional weight loss and gastrointestinal symptoms.  She has experienced unintentional weight loss, losing seven pounds over the past ten weeks. Despite efforts to maintain her weight through portion control and consuming approximately 1500 calories with 80 or more grams of protein daily, she continues to lose weight. She reports success with this dietary plan about half the time. Her physical activity includes walking and strengthening exercises for 30 minutes, two to three times per week.  She has a history of gastric bypass surgery performed in 2008 and is currently on Mounjaro  for type 2 diabetes management. She is hesitant to decrease or stop this medication due to concerns about weight regain, although she is experiencing malnutrition and muscle mass loss.  She reports gastrointestinal symptoms including intermittent gnawing abdominal pain located in the epigastric region, sometimes lasting all day. Nausea occurs occasionally, sometimes leading to vomiting, though not frequently. The nausea sometimes occurs after eating, and she occasionally vomits mucus or food. She has not had a recent GI workup.  She has been trying to increase her protein intake  through dietary means, including considering protein shakes and Fairlife chocolate milk, which she dilutes due to its richness. She has also considered protein shots, which she found difficult to consume post-surgery.  She has a history of osteoporosis, which she attributes to not following post-surgical dietary recommendations. She occasionally experiences stomach pain after eating, which she associates with eating too quickly or drinking fluids with meals.      PHYSICAL EXAM:  Blood pressure 128/81, pulse 82, temperature 98.2 F (36.8 C), height 5\' 3"  (1.6 m), weight 121 lb (54.9 kg), last menstrual period 05/11/2010, SpO2 99%. Body mass index is 21.43 kg/m.  DIAGNOSTIC DATA REVIEWED:  BMET    Component Value Date/Time   NA 143 03/11/2023 1514   K 4.5 03/11/2023 1514   CL 106 03/11/2023 1514   CO2 21 03/11/2023 1514   GLUCOSE 109 (H) 03/11/2023 1514   GLUCOSE 126 (H) 11/30/2022 1058   BUN 13 03/11/2023 1514   CREATININE 1.30 (H) 03/11/2023 1514   CREATININE 1.19 (H) 11/16/2022 1223   CALCIUM  9.0 03/11/2023 1514   CALCIUM  9.3 08/14/2011 1217   GFRNONAA 48 (L) 11/30/2022 1058   GFRNONAA 59 (L) 05/16/2020 1050   GFRAA 69 05/16/2020 1050   Lab Results  Component Value Date   HGBA1C 7.2 (H) 03/11/2023   HGBA1C 6.6 06/09/2010   Lab Results  Component Value Date   INSULIN  8.1 11/16/2017   Lab Results  Component Value Date   TSH 2.56 11/16/2022   CBC    Component Value Date/Time   WBC 4.8 12/14/2022 1052   WBC 5.2 11/30/2022 1058   RBC 4.34 12/14/2022 1052  RBC 4.52 11/30/2022 1058   HGB 11.6 12/14/2022 1052   HCT 36.0 12/14/2022 1052   PLT 342 12/14/2022 1052   MCV 83 12/14/2022 1052   MCH 26.7 12/14/2022 1052   MCH 26.5 11/30/2022 1058   MCHC 32.2 12/14/2022 1052   MCHC 32.6 11/30/2022 1058   RDW 13.6 12/14/2022 1052   Iron Studies    Component Value Date/Time   IRON 70 10/14/2021 1525   TIBC 357 04/14/2012 1059   IRONPCTSAT 57 (H) 04/14/2012 1059    Lipid Panel     Component Value Date/Time   CHOL 181 03/11/2023 1514   TRIG 66 03/11/2023 1514   HDL 75 03/11/2023 1514   CHOLHDL 2.9 11/16/2022 1223   VLDL 10 03/05/2017 1903   LDLCALC 94 03/11/2023 1514   LDLCALC 134 (H) 11/16/2022 1223   Hepatic Function Panel     Component Value Date/Time   PROT 6.3 03/11/2023 1514   ALBUMIN 4.3 03/11/2023 1514   AST 18 03/11/2023 1514   ALT 19 03/11/2023 1514   ALKPHOS 89 03/11/2023 1514   BILITOT 0.3 03/11/2023 1514   BILIDIR 0.1 03/27/2019 1045   IBILI 0.3 03/27/2019 1045      Component Value Date/Time   TSH 2.56 11/16/2022 1223   Nutritional Lab Results  Component Value Date   VD25OH 28.8 (L) 03/11/2023   VD25OH 26.5 (L) 07/22/2022   VD25OH 36 10/14/2021     Assessment and Plan Assessment & Plan Type 2 diabetes mellitus Type 2 diabetes mellitus managed with Mounjaro . Experiencing unintentional weight loss and concerned about weight regain if medication is adjusted. Current dose is 10 mg, reduced to 7.5 mg by endocrinologist due to malnutrition and muscle mass loss. Mounjaro  may contribute to nausea and vomiting by slowing gastric emptying. She has not yet changed her dose. - Reduce Mounjaro  dose to 7.5 mg. - Skip Mounjaro  dose for one week to monitor for vomiting. - Reassess diabetes management with endocrinologist.  Nausea and vomiting Intermittent nausea and vomiting, sometimes postprandial. Symptoms include epigastric gnawing pain, nausea, and occasional vomiting. Differential diagnosis includes potential ulcer or irritation, possibly related to gastric bypass surgery. Urgent evaluation necessary if severe pain occurs due to potential complications from gastric bypass. - Schedule appointment with GI specialist for further evaluation. - Consider ER visit for CT scan if severe pain or doubled over pain occurs. - Avoid Mounjaro  if vomiting persists.  Sarcopenia Sarcopenia due to malnutrition and unintentional weight  loss. Not meeting protein intake goals and losing muscle mass. Increasing protein intake is essential to address muscle loss. - Increase protein intake through dietary changes and supplements. - Consider protein shakes and Fairlife chocolate milk for additional protein. - Explore protein supplements if necessary.  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 Current concern is unintentional weight loss and malnutrition. Working on portion control and increasing protein intake but only successful about half the time.  Follow-up - Follow up in 3 months. - Schedule appointment with GI specialist. - Call GI office in the morning to check for cancellations for earlier appointments.    She was informed of the importance of frequent follow up visits to maximize her success with intensive lifestyle modifications for her multiple health conditions.    Jasmine Mesi, MD

## 2023-05-25 ENCOUNTER — Other Ambulatory Visit (HOSPITAL_COMMUNITY): Payer: Self-pay

## 2023-05-25 ENCOUNTER — Ambulatory Visit: Admitting: Internal Medicine

## 2023-05-25 VITALS — BP 148/82 | Temp 98.9°F | Ht 63.0 in | Wt 126.1 lb

## 2023-05-25 DIAGNOSIS — J4520 Mild intermittent asthma, uncomplicated: Secondary | ICD-10-CM | POA: Diagnosis not present

## 2023-05-25 DIAGNOSIS — N1831 Chronic kidney disease, stage 3a: Secondary | ICD-10-CM

## 2023-05-25 DIAGNOSIS — J22 Unspecified acute lower respiratory infection: Secondary | ICD-10-CM

## 2023-05-25 DIAGNOSIS — Z794 Long term (current) use of insulin: Secondary | ICD-10-CM | POA: Diagnosis not present

## 2023-05-25 DIAGNOSIS — E1122 Type 2 diabetes mellitus with diabetic chronic kidney disease: Secondary | ICD-10-CM | POA: Diagnosis not present

## 2023-05-25 MED ORDER — AZITHROMYCIN 250 MG PO TABS
ORAL_TABLET | ORAL | 0 refills | Status: AC
Start: 2023-05-25 — End: 2023-05-30
  Filled 2023-05-25: qty 6, 5d supply, fill #0

## 2023-05-25 MED ORDER — HYDROCODONE BIT-HOMATROP MBR 5-1.5 MG/5ML PO SOLN
5.0000 mL | Freq: Three times a day (TID) | ORAL | 0 refills | Status: AC | PRN
  Filled 2023-05-25: qty 120, 8d supply, fill #0

## 2023-05-25 MED ORDER — FLUCONAZOLE 150 MG PO TABS
150.0000 mg | ORAL_TABLET | Freq: Once | ORAL | 0 refills | Status: AC
  Filled 2023-05-25: qty 1, 1d supply, fill #0

## 2023-05-25 MED ORDER — METHYLPREDNISOLONE 4 MG PO TABS
ORAL_TABLET | ORAL | 0 refills | Status: DC
  Filled 2023-05-25: qty 28, 10d supply, fill #0
  Filled 2023-05-25: qty 42, 12d supply, fill #0
  Filled 2023-05-26: qty 14, 2d supply, fill #0
  Filled 2023-05-26: qty 28, 10d supply, fill #0

## 2023-05-25 NOTE — Progress Notes (Signed)
 Patient Care Team: Sylvan Evener, MD as PCP - General (Internal Medicine) Lucendia Rusk, MD as PCP - Cardiology (Cardiology)  Visit Date: 05/25/23  Subjective:   Chief Complaint  Patient presents with   Asthma   Cough and bronchospasm  Patient ZO:XWRUEA A Lapid,Female DOB:August 14, 1960,63 y.o. VWU:[id number]   63 y.o. Female presents today for acute sick visit with Asthma. Says that she was sick last week, had a productive cough and was expectorating green mucus and feels like her asthma has been exacerbated from her symptoms. She says that she tested herself and her husband for Covid-19 & Flu, which were both negative.  Past Medical History:  Diagnosis Date   Allergy    Anemia, iron deficiency    Anhedonia    Anxiety    Asthma    Asthma, chronic    Back pain    CAD (coronary artery disease)    Cervical spondylosis 06/13/2020   Chewing difficulty    Chronic insomnia    Chronic kidney disease    CKD stage 3 A    Combined hyperlipidemia    Constipation    Depression    Diabetes mellitus    Diabetes mellitus type II    Diabetic autonomic neuropathy (HCC)    Difficulty swallowing pills    DM type 2 with diabetic peripheral neuropathy (HCC)    Dry mouth    Dyspepsia    Dyspnea    Elevated homocysteine    Essential tremor    Fatigue    Fatty liver    GERD (gastroesophageal reflux disease)    GERD (gastroesophageal reflux disease)    Glossitis    Goiter    Hemorrhoids    HLD (hyperlipidemia)    Hyperparathyroidism , secondary, non-renal (HCC)    Hypertension    Hypocalcemia    Insomnia    Leg edema    Leg pain    Macular degeneration    Nonproliferative diabetic retinopathy associated with type 2 diabetes mellitus (HCC)    RESOLVED   Obesity, Class III, BMI 40-49.9 (morbid obesity) (HCC)    Osteopenia    Osteoporosis    Pallor    PONV (postoperative nausea and vomiting)    Status post gastric surgery    Tachycardia    Thyroiditis, autoimmune     Vaginal dryness, menopausal    Vitamin D  deficiency     Allergies  Allergen Reactions   Theophyllines Palpitations    Family History  Problem Relation Age of Onset   Thyroid  disease Mother        Hypothyroid   Depression Mother    Liver disease Mother    Obesity Mother    Diabetes Father        T2 DM   Hypertension Father    Hyperlipidemia Father    Heart disease Father    Stroke Father    Kidney Stones Father    Obesity Brother        250 pounds at a height of 76 inches.   Thyroid  disease Sister        Hypothyroid   Diabetes Maternal Grandmother    Colon cancer Other        great grandfather   Liver disease Maternal Aunt        NASH   Esophageal cancer Neg Hx    Pancreatic cancer Neg Hx    Kidney disease Neg Hx    Colon polyps Neg Hx    Rectal cancer Neg Hx  Stomach cancer Neg Hx    Social Hx: Married. 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  Review of Systems  Respiratory:  Positive for cough and sputum production (green).   All other systems reviewed and are negative.    Objective:  Vitals: BP (!) 148/82   Temp 98.9 F (37.2 C) (Temporal)   Ht 5\' 3"  (1.6 m)   Wt 126 lb 1.9 oz (57.2 kg)   LMP 05/11/2010   SpO2 92%   BMI 22.34 kg/m   Physical Exam Vitals and nursing note reviewed.  Constitutional:      General: She is not in acute distress.    Appearance: Normal appearance. She is not ill-appearing.  HENT:     Head: Normocephalic and atraumatic.     Right Ear: Tympanic membrane, ear canal and external ear normal.     Left Ear: Ear canal and external ear normal. Tympanic membrane is erythematous (pink-ish).     Ears:     Comments: Left TM full, dull, and pink-ish    Mouth/Throat:     Mouth: Mucous membranes are moist.     Pharynx: Oropharynx is clear. No oropharyngeal exudate or posterior oropharyngeal erythema.  Pulmonary:     Effort: Pulmonary effort is normal.     Breath sounds: Normal breath sounds. No wheezing, rhonchi or rales.   Lymphadenopathy:     Cervical: No cervical adenopathy.  Skin:    General: Skin is warm and dry.  Neurological:     Mental Status: She is alert and oriented to person, place, and time. Mental status is at baseline.  Psychiatric:        Mood and Affect: Mood normal.        Behavior: Behavior normal.        Thought Content: Thought content normal.        Judgment: Judgment normal.     Results:  Studies Obtained And Personally Reviewed By Me: Labs:     Component Value Date/Time   NA 143 03/11/2023 1514   K 4.5 03/11/2023 1514   CL 106 03/11/2023 1514   CO2 21 03/11/2023 1514   GLUCOSE 109 (H) 03/11/2023 1514   GLUCOSE 126 (H) 11/30/2022 1058   BUN 13 03/11/2023 1514   CREATININE 1.30 (H) 03/11/2023 1514   CREATININE 1.19 (H) 11/16/2022 1223   CALCIUM  9.0 03/11/2023 1514   CALCIUM  9.3 08/14/2011 1217   PROT 6.3 03/11/2023 1514   ALBUMIN 4.3 03/11/2023 1514   AST 18 03/11/2023 1514   ALT 19 03/11/2023 1514   ALKPHOS 89 03/11/2023 1514   BILITOT 0.3 03/11/2023 1514   GFRNONAA 48 (L) 11/30/2022 1058   GFRNONAA 59 (L) 05/16/2020 1050   GFRAA 69 05/16/2020 1050    Lab Results  Component Value Date   WBC 4.8 12/14/2022   HGB 11.6 12/14/2022   HCT 36.0 12/14/2022   MCV 83 12/14/2022   PLT 342 12/14/2022   Lab Results  Component Value Date   CHOL 181 03/11/2023   HDL 75 03/11/2023   LDLCALC 94 03/11/2023   TRIG 66 03/11/2023   CHOLHDL 2.9 11/16/2022   Lab Results  Component Value Date   HGBA1C 7.2 (H) 03/11/2023    Lab Results  Component Value Date   TSH 2.56 11/16/2022   Assessment & Plan:   Meds ordered this encounter  Medications   methylPREDNISolone  (MEDROL ) 4 MG tablet    Sig: Take in tapering course as directed 6-6-5-5-4-4-3-3-2-2-1-1    Dispense:  42  tablet    Refill:  0   HYDROcodone  bit-homatropine (HYCODAN) 5-1.5 MG/5ML syrup    Sig: Take 5 mLs by mouth every 8 (eight) hours as needed for cough.    Dispense:  120 mL    Refill:  0    azithromycin  (ZITHROMAX ) 250 MG tablet    Sig: Take 2 tablets on day 1, then 1 tablet daily on days 2 through 5    Dispense:  6 tablet    Refill:  0   fluconazole  (DIFLUCAN ) 150 MG tablet    Sig: Take 1 tablet (150 mg total) by mouth once for 1 dose.    Dispense:  1 tablet    Refill:  0  Acute Lower Respiratory Infection; Mild Intermittent Asthma: at-home Covid-19/Flu test were negative. Last week had a productive cough, was expectorating green mucus, and feels like her asthma has been exacerbated due to this. Sending in 4 mg Medrol  tapering course 6-6-5-5-4-4-3-3-2-2-1-1, Hycodan syrup for cough - take 1 teaspoon every 8 hours as needed, 250 mg Azithromycin  - take 2 tablets on Day 1 and 1 tablet on Days 2-5, and 150 mg Diflucan  in case she develops Candida vaginitis. Contact us  if symptoms worsen/persist despite treatment.      I,Emily Lagle,acting as a Neurosurgeon for Sylvan Evener, MD.,have documented all relevant documentation on the behalf of Sylvan Evener, MD,as directed by  Sylvan Evener, MD while in the presence of Sylvan Evener, MD.   I, Sylvan Evener, MD, have reviewed all documentation for this visit. The documentation on 05/26/23 for the exam, diagnosis, procedures, and orders are all accurate and complete.

## 2023-05-26 ENCOUNTER — Other Ambulatory Visit (HOSPITAL_COMMUNITY): Payer: Self-pay

## 2023-05-26 ENCOUNTER — Other Ambulatory Visit: Payer: Self-pay

## 2023-05-26 ENCOUNTER — Encounter: Payer: Self-pay | Admitting: Internal Medicine

## 2023-05-26 NOTE — Patient Instructions (Addendum)
 We are sorry you are not feeling well.  For acute lower respiratory infection, we are sending in a 12-day Medrol  Dosepak to take as directed starting with 6 tablets day 1 and decreasing by 1 tablet every 2 days.  May take Hycodan syrup for cough 1 teaspoon every 8 hours as needed.  Please start Zithromax  Z-PAK 2 tabs day 1 followed by 1 tab days 2 through 5.  We have also sent in Diflucan  150 mg tablet in case you develop Candida vaginitis while on antibiotics and steroids.  Please call if not better in 7 to 10 days or sooner if worse.

## 2023-05-28 ENCOUNTER — Telehealth: Payer: Self-pay | Admitting: Internal Medicine

## 2023-05-28 DIAGNOSIS — Z0279 Encounter for issue of other medical certificate: Secondary | ICD-10-CM

## 2023-05-28 NOTE — Telephone Encounter (Signed)
 FMLA paperwork from [employer] received and faxed to [employer] at Fax: [phone] for charging. And faxed to [employer] at [phone]

## 2023-06-02 NOTE — Telephone Encounter (Addendum)
 I called patient and had to lvm for patient to call back. We received another blank FMLA form from [employer] when I had faxed on 05/28/23 with conformation fax back that it went through successfully. I left message with patient to make sure [employer] received the one we sent and if there was a reason they sent another blank one. I did fax a second time of the old one just in case

## 2023-06-03 NOTE — Telephone Encounter (Signed)
 FMLA clarification to fax [employer] The date of intermittent leave 05/17/23-05/16/24. Will need  3 or 4 shifts month. Twelve hours each occurrence And to [place]  fax: [phone]. [employer] stated they can refax the original form as long as it has the information updated.   Pt stated she has recovered from asthma and is feeling well overall.   Pt stated 5 th phone call. First time cut off, 3rd time, and said she spoke to office transferred to original number .  This encounter was created in error - please disregard.

## 2023-06-03 NOTE — Telephone Encounter (Addendum)
 FMLA clarification to fax [employer] The date of intermittent leave 05/17/23-05/16/24. Will need  3 or 4 shifts month. Twelve hours each occurrence And to send to [place]  fax: [phone]. [employer] stated they can refax the original form as long as it has the information updated.    Pt stated she has recovered from asthma and is feeling well overall.   Pt c/o attempting 5 times to talk to someone about her request. Cal CAL and Fabian Holster took the call for service recovery.

## 2023-06-24 ENCOUNTER — Other Ambulatory Visit: Payer: Self-pay | Admitting: Internal Medicine

## 2023-06-24 DIAGNOSIS — Z1231 Encounter for screening mammogram for malignant neoplasm of breast: Secondary | ICD-10-CM

## 2023-06-29 ENCOUNTER — Other Ambulatory Visit (HOSPITAL_BASED_OUTPATIENT_CLINIC_OR_DEPARTMENT_OTHER): Payer: Self-pay

## 2023-07-15 DIAGNOSIS — F39 Unspecified mood [affective] disorder: Secondary | ICD-10-CM | POA: Diagnosis not present

## 2023-07-21 DIAGNOSIS — N2581 Secondary hyperparathyroidism of renal origin: Secondary | ICD-10-CM | POA: Diagnosis not present

## 2023-07-21 DIAGNOSIS — N183 Chronic kidney disease, stage 3 unspecified: Secondary | ICD-10-CM | POA: Diagnosis not present

## 2023-07-21 DIAGNOSIS — I10 Essential (primary) hypertension: Secondary | ICD-10-CM | POA: Diagnosis not present

## 2023-07-21 DIAGNOSIS — E1165 Type 2 diabetes mellitus with hyperglycemia: Secondary | ICD-10-CM | POA: Diagnosis not present

## 2023-07-21 DIAGNOSIS — E7800 Pure hypercholesterolemia, unspecified: Secondary | ICD-10-CM | POA: Diagnosis not present

## 2023-07-28 ENCOUNTER — Other Ambulatory Visit (HOSPITAL_BASED_OUTPATIENT_CLINIC_OR_DEPARTMENT_OTHER): Payer: Self-pay

## 2023-07-28 MED ORDER — MOUNJARO 7.5 MG/0.5ML ~~LOC~~ SOAJ
7.5000 mg | SUBCUTANEOUS | 5 refills | Status: DC
  Filled 2023-07-28: qty 2, 28d supply, fill #0
  Filled 2023-08-27: qty 2, 28d supply, fill #1

## 2023-08-04 ENCOUNTER — Ambulatory Visit
Admission: RE | Admit: 2023-08-04 | Discharge: 2023-08-04 | Disposition: A | Discharge status: Home / Self Care | Source: Ambulatory Visit | Attending: Internal Medicine | Admitting: Internal Medicine

## 2023-08-04 DIAGNOSIS — Z1231 Encounter for screening mammogram for malignant neoplasm of breast: Secondary | ICD-10-CM | POA: Diagnosis not present

## 2023-08-23 ENCOUNTER — Ambulatory Visit (INDEPENDENT_AMBULATORY_CARE_PROVIDER_SITE_OTHER): Admitting: Family Medicine

## 2023-08-27 ENCOUNTER — Other Ambulatory Visit (HOSPITAL_BASED_OUTPATIENT_CLINIC_OR_DEPARTMENT_OTHER): Payer: Self-pay

## 2023-09-08 ENCOUNTER — Ambulatory Visit (INDEPENDENT_AMBULATORY_CARE_PROVIDER_SITE_OTHER): Admitting: Family Medicine

## 2023-09-08 ENCOUNTER — Telehealth (INDEPENDENT_AMBULATORY_CARE_PROVIDER_SITE_OTHER): Payer: Self-pay

## 2023-09-08 ENCOUNTER — Encounter (INDEPENDENT_AMBULATORY_CARE_PROVIDER_SITE_OTHER): Payer: Self-pay | Admitting: Family Medicine

## 2023-09-08 ENCOUNTER — Other Ambulatory Visit (HOSPITAL_BASED_OUTPATIENT_CLINIC_OR_DEPARTMENT_OTHER): Payer: Self-pay

## 2023-09-08 VITALS — BP 129/81 | HR 84 | Temp 97.8°F | Ht 63.0 in | Wt 123.0 lb

## 2023-09-08 DIAGNOSIS — Z6830 Body mass index (BMI) 30.0-30.9, adult: Secondary | ICD-10-CM

## 2023-09-08 DIAGNOSIS — E669 Obesity, unspecified: Secondary | ICD-10-CM

## 2023-09-08 DIAGNOSIS — E119 Type 2 diabetes mellitus without complications: Secondary | ICD-10-CM | POA: Diagnosis not present

## 2023-09-08 DIAGNOSIS — M810 Age-related osteoporosis without current pathological fracture: Secondary | ICD-10-CM

## 2023-09-08 DIAGNOSIS — Z7985 Long-term (current) use of injectable non-insulin antidiabetic drugs: Secondary | ICD-10-CM

## 2023-09-08 DIAGNOSIS — E559 Vitamin D deficiency, unspecified: Secondary | ICD-10-CM

## 2023-09-08 DIAGNOSIS — N1831 Chronic kidney disease, stage 3a: Secondary | ICD-10-CM

## 2023-09-08 DIAGNOSIS — Z6821 Body mass index (BMI) 21.0-21.9, adult: Secondary | ICD-10-CM | POA: Diagnosis not present

## 2023-09-08 DIAGNOSIS — E1122 Type 2 diabetes mellitus with diabetic chronic kidney disease: Secondary | ICD-10-CM

## 2023-09-08 DIAGNOSIS — M818 Other osteoporosis without current pathological fracture: Secondary | ICD-10-CM

## 2023-09-08 DIAGNOSIS — Z9884 Bariatric surgery status: Secondary | ICD-10-CM

## 2023-09-08 MED ORDER — VITAMIN D (ERGOCALCIFEROL) 1.25 MG (50000 UNIT) PO CAPS
50000.0000 [IU] | ORAL_CAPSULE | ORAL | 0 refills | Status: DC
  Filled 2023-09-08: qty 13, 90d supply, fill #0

## 2023-09-08 NOTE — Progress Notes (Signed)
 Office: [phone]  /  Fax: [phone]  WEIGHT SUMMARY AND BIOMETRICS  Anthropometric Measurements Height: 5' 3 (1.6 m) Weight: 123 lb (55.8 kg) BMI (Calculated): 21.79 Weight at Last Visit: 121 lb Weight Lost Since Last Visit: 0 Weight Gained Since Last Visit: 2 lb Starting Weight: 169 lb Total Weight Loss (lbs): 46 lb (20.9 kg)   Body Composition  Body Fat %: 34.2 % Fat Mass (lbs): 42.2 lbs Muscle Mass (lbs): 77 lbs Total Body Water (lbs): 58.4 lbs Visceral Fat Rating : 7   Other Clinical Data Labs: no Today's Visit #: 45 Starting Date: 11/16/17    Chief Complaint: OBESITY    History of Present Illness Alexa [name] is a 63 year old female with obesity and type 2 diabetes who presents for obesity treatment plan discussion.  She has been maintaining her weight with a BMI below 22, although she has gained two pounds over the last three to four months. She is working on keeping her calorie intake between 1500 and 1600 and aiming for 80 grams of protein per day. She experiences challenges with protein intake but tries to eat protein first during meals.  Her type 2 diabetes is managed by her endocrinologist. Her last hemoglobin A1c in February was 7.2, but it has improved to the mid-sixes after her Mounjaro  dose was lowered from 15 mg to 7.5 mg but this may not be indicative of the decrease dose. She experienced increased hunger and weight gain to 132 pounds on the lower dose, prompting her to self-adjust back to 15 mg about three weeks ago, resulting in a current weight of 123 pounds.  She reports high stress levels due to various personal issues, including the recent loss of a pet, which she described as 'really sick' and 'unexpectedly put down'. She expressed frustration with the process of rescheduling an appointment during this stressful time.  She has been walking for exercise and using resistance bands, although this routine fell apart about a month ago. She  is motivated to improve her lifestyle and is considering incorporating more fruits and vegetables and taking her blood pressure medication more consistently.  She has osteoporosis and acknowledges the need for strengthening exercises to manage this condition. She is considering using ankle weights or a weighted vest to enhance her walking routine.      PHYSICAL EXAM:  Blood pressure 129/81, pulse 84, temperature 97.8 F (36.6 C), height 5' 3 (1.6 m), weight 123 lb (55.8 kg), last menstrual period 05/11/2010, SpO2 97%. Body mass index is 21.79 kg/m.  DIAGNOSTIC DATA REVIEWED:  BMET    Component Value Date/Time   NA 143 03/11/2023 1514   K 4.5 03/11/2023 1514   CL 106 03/11/2023 1514   CO2 21 03/11/2023 1514   GLUCOSE 109 (H) 03/11/2023 1514   GLUCOSE 126 (H) 11/30/2022 1058   BUN 13 03/11/2023 1514   CREATININE 1.30 (H) 03/11/2023 1514   CREATININE 1.19 (H) 11/16/2022 1223   CALCIUM  9.0 03/11/2023 1514   CALCIUM  9.3 08/14/2011 1217   GFRNONAA 48 (L) 11/30/2022 1058   GFRNONAA 59 (L) 05/16/2020 1050   GFRAA 69 05/16/2020 1050   Lab Results  Component Value Date   HGBA1C 7.2 (H) 03/11/2023   HGBA1C 6.6 06/09/2010   Lab Results  Component Value Date   INSULIN  8.1 11/16/2017   Lab Results  Component Value Date   TSH 2.56 11/16/2022   CBC    Component Value Date/Time   WBC 4.8 12/14/2022 1052  WBC 5.2 11/30/2022 1058   RBC 4.34 12/14/2022 1052   RBC 4.52 11/30/2022 1058   HGB 11.6 12/14/2022 1052   HCT 36.0 12/14/2022 1052   PLT 342 12/14/2022 1052   MCV 83 12/14/2022 1052   MCH 26.7 12/14/2022 1052   MCH 26.5 11/30/2022 1058   MCHC 32.2 12/14/2022 1052   MCHC 32.6 11/30/2022 1058   RDW 13.6 12/14/2022 1052   Iron Studies    Component Value Date/Time   IRON 70 10/14/2021 1525   TIBC 357 04/14/2012 1059   IRONPCTSAT 57 (H) 04/14/2012 1059   Lipid Panel     Component Value Date/Time   CHOL 181 03/11/2023 1514   TRIG 66 03/11/2023 1514   HDL 75  03/11/2023 1514   CHOLHDL 2.9 11/16/2022 1223   VLDL 10 03/05/2017 1903   LDLCALC 94 03/11/2023 1514   LDLCALC 134 (H) 11/16/2022 1223   Hepatic Function Panel     Component Value Date/Time   PROT 6.3 03/11/2023 1514   ALBUMIN 4.3 03/11/2023 1514   AST 18 03/11/2023 1514   ALT 19 03/11/2023 1514   ALKPHOS 89 03/11/2023 1514   BILITOT 0.3 03/11/2023 1514   BILIDIR 0.1 03/27/2019 1045   IBILI 0.3 03/27/2019 1045      Component Value Date/Time   TSH 2.56 11/16/2022 1223   Nutritional Lab Results  Component Value Date   VD25OH 28.8 (L) 03/11/2023   VD25OH 26.5 (L) 07/22/2022   VD25OH 36 10/14/2021     Assessment and Plan Assessment & Plan Obesity in maintenance phase BMI is below 22, indicating maintenance phase. Weight gain of two pounds over the last three to four months. Current focus on maintaining weight and muscle mass. [employer] obesity conference suggests discontinuing GLP-1 agonists if BMI is below 22. - Reduce Mounjaro  dose to 7.5 mg to manage hunger and promote muscle gain - Focus on protein intake - Engage in strengthening exercises - Consider adding ankle weights or weighted vest for walking  Type 2 diabetes mellitus Previously managed with Mounjaro  15 mg, now reduced to 7.5 mg. Hemoglobin A1c improved to mid 6s. Discussion on potential addition of Jardiance for further management, considering its benefits for cardiac and renal health. Consideration of reintroducing metformin  to aid blood sugar control without affecting appetite. - Discuss potential addition of Jardiance with endocrinologist - Consider reintroducing metformin   Osteoporosis Existing diagnosis of osteoporosis. Emphasis on the importance of strengthening exercises to combat osteoporosis. Discussion on the benefits of weight-bearing exercises to improve bone health. - Engage in strengthening exercises - Consider using ankle weights or weighted vest during walks  Vitamin D  deficiency Discussion on  the need for vitamin D  supplementation to support bone health and overall well-being. - Send vitamin D  pres[facility]    She was informed of the importance of frequent follow up visits to maximize her success with intensive lifestyle modifications for her multiple health conditions. Follow up in 3 months   Louann Penton, MD

## 2023-09-14 DIAGNOSIS — F39 Unspecified mood [affective] disorder: Secondary | ICD-10-CM | POA: Diagnosis not present

## 2023-09-29 ENCOUNTER — Other Ambulatory Visit (HOSPITAL_BASED_OUTPATIENT_CLINIC_OR_DEPARTMENT_OTHER): Payer: Self-pay

## 2023-10-05 ENCOUNTER — Other Ambulatory Visit (HOSPITAL_BASED_OUTPATIENT_CLINIC_OR_DEPARTMENT_OTHER): Payer: Self-pay

## 2023-10-05 NOTE — Telephone Encounter (Signed)
 SABRA

## 2023-10-06 ENCOUNTER — Other Ambulatory Visit: Payer: Self-pay

## 2023-10-18 ENCOUNTER — Other Ambulatory Visit: Payer: Self-pay

## 2023-10-25 ENCOUNTER — Other Ambulatory Visit: Payer: Self-pay

## 2023-11-05 ENCOUNTER — Other Ambulatory Visit (HOSPITAL_BASED_OUTPATIENT_CLINIC_OR_DEPARTMENT_OTHER): Payer: Self-pay

## 2023-12-01 ENCOUNTER — Other Ambulatory Visit (HOSPITAL_BASED_OUTPATIENT_CLINIC_OR_DEPARTMENT_OTHER): Payer: Self-pay

## 2023-12-01 ENCOUNTER — Telehealth (INDEPENDENT_AMBULATORY_CARE_PROVIDER_SITE_OTHER): Payer: Self-pay | Admitting: Family Medicine

## 2023-12-01 ENCOUNTER — Encounter (INDEPENDENT_AMBULATORY_CARE_PROVIDER_SITE_OTHER): Payer: Self-pay | Admitting: Family Medicine

## 2023-12-01 VITALS — Ht 63.0 in | Wt 123.0 lb

## 2023-12-01 DIAGNOSIS — E559 Vitamin D deficiency, unspecified: Secondary | ICD-10-CM | POA: Diagnosis not present

## 2023-12-01 DIAGNOSIS — Z7984 Long term (current) use of oral hypoglycemic drugs: Secondary | ICD-10-CM | POA: Diagnosis not present

## 2023-12-01 DIAGNOSIS — E1122 Type 2 diabetes mellitus with diabetic chronic kidney disease: Secondary | ICD-10-CM

## 2023-12-01 DIAGNOSIS — N1831 Chronic kidney disease, stage 3a: Secondary | ICD-10-CM | POA: Diagnosis not present

## 2023-12-01 DIAGNOSIS — Z6821 Body mass index (BMI) 21.0-21.9, adult: Secondary | ICD-10-CM | POA: Diagnosis not present

## 2023-12-01 DIAGNOSIS — E669 Obesity, unspecified: Secondary | ICD-10-CM

## 2023-12-01 DIAGNOSIS — U071 COVID-19: Secondary | ICD-10-CM | POA: Diagnosis not present

## 2023-12-01 DIAGNOSIS — Z9884 Bariatric surgery status: Secondary | ICD-10-CM | POA: Diagnosis not present

## 2023-12-01 MED ORDER — VITAMIN D (ERGOCALCIFEROL) 1.25 MG (50000 UNIT) PO CAPS
50000.0000 [IU] | ORAL_CAPSULE | ORAL | 0 refills | Status: DC
  Filled 2023-12-01: qty 13, 90d supply, fill #0

## 2023-12-01 NOTE — Progress Notes (Signed)
 Office: [phone]  /  Fax: [phone]  WEIGHT SUMMARY AND BIOMETRICS  Anthropometric Measurements Height: 5' 3 (1.6 m) Weight: 123 lb (55.8 kg) (last ov) BMI (Calculated): 21.79 Weight at Last Visit: 123 lb Starting Weight: 169 lb   No data recorded Other Clinical Data Fasting: no Labs: no Today's Visit #: 46 Starting Date: 11/16/17 Comments: my chart video visit    Chief Complaint: OBESITY  Virtual Visit via A/V Note  I connected with Alexa [name] on 12/01/23 at 11:00 AM EST by audiovisual telehealth and verified that I am speaking with the correct person using two identifiers.  Location: Patient: home Provider: clinic   I discussed the limitations, risks, security and privacy concerns of performing an evaluation and management service by AV telehealth and the availability of in person appointments. I also discussed with the patient that there may be a patient responsible charge related to this service. The patient expressed understanding and agreed to proceed.  History of Present Illness The patient presents with symptoms of COVID-19 infection.  Symptoms began on Sunday with a positive COVID-19 test. They have mild symptoms including chest tightness, significant fatigue, sore throat, and congestion. A headache was present initially but resolved by Wednesday. They are making efforts to stay hydrated.  They are actively monitoring their weight, which remains stable between 125 to 130 pounds, currently at 127 pounds. They focus on a high-protein diet, cooking at home to avoid fast food, and are mindful of portion control, especially with sweets. They limit [employer] intake to one small can per day and consume candy in moderation.  Physical activity includes walking with a young woman to stay active. They enjoy cooking, particularly at their [facility], and plan to spend Thanksgiving there with family.  They are taking prescription vitamin D  and the levels are  slowly increasing.   Their last A1c was the lowest it has been in a long time, at 6.7 in August due to her lifestyle modifications.  No current headaches.      PHYSICAL EXAM:  Height 5' 3 (1.6 m), weight 123 lb (55.8 kg), last menstrual period 05/11/2010. Body mass index is 21.79 kg/m.  DIAGNOSTIC DATA REVIEWED:  BMET    Component Value Date/Time   NA 143 03/11/2023 1514   K 4.5 03/11/2023 1514   CL 106 03/11/2023 1514   CO2 21 03/11/2023 1514   GLUCOSE 109 (H) 03/11/2023 1514   GLUCOSE 126 (H) 11/30/2022 1058   BUN 13 03/11/2023 1514   CREATININE 1.30 (H) 03/11/2023 1514   CREATININE 1.19 (H) 11/16/2022 1223   CALCIUM  9.0 03/11/2023 1514   CALCIUM  9.3 08/14/2011 1217   GFRNONAA 48 (L) 11/30/2022 1058   GFRNONAA 59 (L) 05/16/2020 1050   GFRAA 69 05/16/2020 1050   Lab Results  Component Value Date   HGBA1C 7.2 (H) 03/11/2023   HGBA1C 6.6 06/09/2010   Lab Results  Component Value Date   INSULIN  8.1 11/16/2017   Lab Results  Component Value Date   TSH 2.56 11/16/2022   CBC    Component Value Date/Time   WBC 4.8 12/14/2022 1052   WBC 5.2 11/30/2022 1058   RBC 4.34 12/14/2022 1052   RBC 4.52 11/30/2022 1058   HGB 11.6 12/14/2022 1052   HCT 36.0 12/14/2022 1052   PLT 342 12/14/2022 1052   MCV 83 12/14/2022 1052   MCH 26.7 12/14/2022 1052   MCH 26.5 11/30/2022 1058   MCHC 32.2 12/14/2022 1052   MCHC 32.6 11/30/2022  1058   RDW 13.6 12/14/2022 1052   Iron Studies    Component Value Date/Time   IRON 70 10/14/2021 1525   TIBC 357 04/14/2012 1059   IRONPCTSAT 57 (H) 04/14/2012 1059   Lipid Panel     Component Value Date/Time   CHOL 181 03/11/2023 1514   TRIG 66 03/11/2023 1514   HDL 75 03/11/2023 1514   CHOLHDL 2.9 11/16/2022 1223   VLDL 10 03/05/2017 1903   LDLCALC 94 03/11/2023 1514   LDLCALC 134 (H) 11/16/2022 1223   Hepatic Function Panel     Component Value Date/Time   PROT 6.3 03/11/2023 1514   ALBUMIN 4.3 03/11/2023 1514   AST 18  03/11/2023 1514   ALT 19 03/11/2023 1514   ALKPHOS 89 03/11/2023 1514   BILITOT 0.3 03/11/2023 1514   BILIDIR 0.1 03/27/2019 1045   IBILI 0.3 03/27/2019 1045      Component Value Date/Time   TSH 2.56 11/16/2022 1223   Nutritional Lab Results  Component Value Date   VD25OH 28.8 (L) 03/11/2023   VD25OH 26.5 (L) 07/22/2022   VD25OH 36 10/14/2021     Assessment and Plan Assessment & Plan  COVID-19 infection Positive COVID-19 test with symptoms of chest tightness, fatigue, sore throat, congestion, and headache. Symptoms are mild to moderate. No severe respiratory distress reported. - Encouraged rest and hydration - Advised to avoid exposure to others  Type 2 diabetes mellitus Recent A1c of 6.0, the lowest recorded. Awaiting endocrinologist's evaluation for potential initiation of Jardiance or Mounjaro  based on A1c results. - Await endocrinologist's evaluation and recommendations regarding medication adjustments - Continue diet, exercise and weight loss as discussed today as an important part of the treatment plan  Obesity Controlled, last BMI 21.7, below 22, and GLP-1 is no longer recommended if glucose is controlled when BMI is below 22. She has had significant sarcopenia and is working on increasing protein and strengthening exercise - Goal to maintain weight   Vitamin D  deficiency Requires refill of vitamin D  supplementation, stable, no SE noted - Refilled vitamin D  prescription   Alexa [name] was counseled on the importance of maintaining healthy lifestyle habits, including balanced nutrition, regular physical activity, and behavioral modifications, while taking antiobesity medication.  Patient verbalized understanding that medication is an adjunct to, not a replacement for, lifestyle changes and that the long-term success and weight maintenance depend on continued adherence to these strategies.   Alexa [name] was informed of the importance of frequent follow up visits to maximize her  success with intensive lifestyle modifications for her obesity and obesity related health conditions as recommended by USPSTF and CMS guidelines   Alexa Penton, MD

## 2023-12-15 DIAGNOSIS — F39 Unspecified mood [affective] disorder: Secondary | ICD-10-CM | POA: Diagnosis not present

## 2023-12-17 ENCOUNTER — Other Ambulatory Visit (HOSPITAL_BASED_OUTPATIENT_CLINIC_OR_DEPARTMENT_OTHER): Payer: Self-pay

## 2024-01-06 DIAGNOSIS — F39 Unspecified mood [affective] disorder: Secondary | ICD-10-CM | POA: Diagnosis not present

## 2024-03-01 ENCOUNTER — Telehealth (INDEPENDENT_AMBULATORY_CARE_PROVIDER_SITE_OTHER): Admitting: Family Medicine

## 2024-03-01 ENCOUNTER — Other Ambulatory Visit (HOSPITAL_BASED_OUTPATIENT_CLINIC_OR_DEPARTMENT_OTHER): Payer: Self-pay

## 2024-03-01 ENCOUNTER — Encounter (INDEPENDENT_AMBULATORY_CARE_PROVIDER_SITE_OTHER): Payer: Self-pay | Admitting: Family Medicine

## 2024-03-01 VITALS — Ht 63.0 in | Wt 123.0 lb

## 2024-03-01 DIAGNOSIS — M6284 Sarcopenia: Secondary | ICD-10-CM

## 2024-03-01 DIAGNOSIS — E669 Obesity, unspecified: Secondary | ICD-10-CM | POA: Diagnosis not present

## 2024-03-01 DIAGNOSIS — Z7985 Long-term (current) use of injectable non-insulin antidiabetic drugs: Secondary | ICD-10-CM | POA: Diagnosis not present

## 2024-03-01 DIAGNOSIS — N1831 Chronic kidney disease, stage 3a: Secondary | ICD-10-CM | POA: Diagnosis not present

## 2024-03-01 DIAGNOSIS — E1122 Type 2 diabetes mellitus with diabetic chronic kidney disease: Secondary | ICD-10-CM | POA: Diagnosis not present

## 2024-03-01 DIAGNOSIS — E559 Vitamin D deficiency, unspecified: Secondary | ICD-10-CM

## 2024-03-01 DIAGNOSIS — Z6821 Body mass index (BMI) 21.0-21.9, adult: Secondary | ICD-10-CM | POA: Diagnosis not present

## 2024-03-01 MED ORDER — VITAMIN D (ERGOCALCIFEROL) 1.25 MG (50000 UNIT) PO CAPS
50000.0000 [IU] | ORAL_CAPSULE | ORAL | 0 refills | Status: AC
  Filled 2024-03-01: qty 13, 90d supply, fill #0

## 2024-03-01 MED ORDER — TIRZEPATIDE 15 MG/0.5ML ~~LOC~~ SOAJ
15.0000 mg | SUBCUTANEOUS | 0 refills | Status: AC
  Filled 2024-03-01: qty 2, 28d supply, fill #0

## 2024-03-01 NOTE — Progress Notes (Signed)
 "  Office: [phone]  /  Fax: [phone]  WEIGHT SUMMARY AND BIOMETRICS  No data recorded No data recorded No data recorded  Chief Complaint: OBESITY   Virtual Visit via A/V Note  I connected with Alexa [name] on 03/01/24 at 10:40 AM EST by audiovisual telehealth and verified that I am speaking with the correct person using two identifiers.  Location: Patient: home Provider: home   I discussed the limitations, risks, security and privacy concerns of performing an evaluation and management service by AV telehealth and the availability of in person appointments. I also discussed with the patient that there may be a patient responsible charge related to this service. The patient expressed understanding and agreed to proceed.    History of Present Illness Alexa [name] is a 64 year old female with obesity and type 2 diabetes who presents for obesity treatment assessment.  She has been following a journaling plan with a caloric intake of 1500 to 1600 calories and 80 or more grams of protein, adhering to it about 60% of the time. She is not currently exercising and is attempting to consume her recommended amount of protein and more whole foods. However, she is skipping meals and not getting adequate sleep. She feels she may have gained weight, as she states her weight at home today is 130 pounds, increased from 123 pounds in November.  Her type 2 diabetes was last evaluated with a hemoglobin A1c of 7.2 one year ago, which improved to 6.7 in August 2025. Her Mounjaro  dosage was reduced from 15 mg to 7.5 mg. Her weight has been stable between 125 to 130 pounds on 7.5 mg of Mounjaro . She has not been checking her blood sugars and missed an endocrinology appointment last week. She recalls her last A1c was around 6.6 or 6.7. No hypoglycemic episodes, but she notes increased nocturia, though not excessively.  She achieves her protein intake about two-thirds of the time, primarily from  meat, as she does not consume much dairy or protein drinks. She drinks more coffee than preferred, about a couple of cups a day, and finds water intake challenging.      PHYSICAL EXAM:  Last menstrual period 05/11/2010. There is no height or weight on file to calculate BMI.  DIAGNOSTIC DATA REVIEWED BY MYSELF TODAY:  BMET    Component Value Date/Time   NA 143 03/11/2023 1514   K 4.5 03/11/2023 1514   CL 106 03/11/2023 1514   CO2 21 03/11/2023 1514   GLUCOSE 109 (H) 03/11/2023 1514   GLUCOSE 126 (H) 11/30/2022 1058   BUN 13 03/11/2023 1514   CREATININE 1.30 (H) 03/11/2023 1514   CREATININE 1.19 (H) 11/16/2022 1223   CALCIUM  9.0 03/11/2023 1514   CALCIUM  9.3 08/14/2011 1217   GFRNONAA 48 (L) 11/30/2022 1058   GFRNONAA 59 (L) 05/16/2020 1050   GFRAA 69 05/16/2020 1050   Lab Results  Component Value Date   HGBA1C 7.2 (H) 03/11/2023   HGBA1C 6.6 06/09/2010   Lab Results  Component Value Date   INSULIN  8.1 11/16/2017   Lab Results  Component Value Date   TSH 2.56 11/16/2022   CBC    Component Value Date/Time   WBC 4.8 12/14/2022 1052   WBC 5.2 11/30/2022 1058   RBC 4.34 12/14/2022 1052   RBC 4.52 11/30/2022 1058   HGB 11.6 12/14/2022 1052   HCT 36.0 12/14/2022 1052   PLT 342 12/14/2022 1052   MCV 83 12/14/2022 1052   MCH 26.7  12/14/2022 1052   MCH 26.5 11/30/2022 1058   MCHC 32.2 12/14/2022 1052   MCHC 32.6 11/30/2022 1058   RDW 13.6 12/14/2022 1052   Iron Studies    Component Value Date/Time   IRON 70 10/14/2021 1525   TIBC 357 04/14/2012 1059   IRONPCTSAT 57 (H) 04/14/2012 1059   Lipid Panel     Component Value Date/Time   CHOL 181 03/11/2023 1514   TRIG 66 03/11/2023 1514   HDL 75 03/11/2023 1514   CHOLHDL 2.9 11/16/2022 1223   VLDL 10 03/05/2017 1903   LDLCALC 94 03/11/2023 1514   LDLCALC 134 (H) 11/16/2022 1223   Hepatic Function Panel     Component Value Date/Time   PROT 6.3 03/11/2023 1514   ALBUMIN 4.3 03/11/2023 1514   AST 18  03/11/2023 1514   ALT 19 03/11/2023 1514   ALKPHOS 89 03/11/2023 1514   BILITOT 0.3 03/11/2023 1514   BILIDIR 0.1 03/27/2019 1045   IBILI 0.3 03/27/2019 1045      Component Value Date/Time   TSH 2.56 11/16/2022 1223   Nutritional Lab Results  Component Value Date   VD25OH 28.8 (L) 03/11/2023   VD25OH 26.5 (L) 07/22/2022   VD25OH 36 10/14/2021     Assessment and Plan Assessment & Plan Obesity Management is ongoing with a dietary plan of 1500-1600 calories and 80+ grams of protein. She reports adherence to the plan about 60% of the time. Weight has increased from 123 pounds in November to 130 pounds currently. Mounjaro  was previously reduced from 15 mg to 7.5 mg and is currently being used. Concerns exist about BMI potentially decreasing enough to warrant stopping Mounjaro  if BMI falls below 22. She is at risk of malnutrition. - Continue Mounjaro  7.5 mg. - Scheduled next visit in office in 3-4 weeks to assess weight and muscle mass. - Will need to stop Mounjaro  if BMI is below 22.  Sarcopenia Present, and there is a focus on maintaining muscle mass. She is advised to increase protein intake to prevent muscle loss, which is crucial for longevity and overall health. - Encouraged increased protein intake, including non-dairy sources such as eggs, peas, and edamame. - Consider protein drinks if necessary to maintain protein levels. - work on strengthening exercise to help maintain muscle mass  Type 2 diabetes mellitus with stage 3a chronic kidney disease Type 2 diabetes is managed with Mounjaro . Last hemoglobin A1c was 6.7 in August 2025. She reports no hypoglycemic episodes but has increased nocturia slightly. Blood sugar monitoring is not currently being done, and an endocrinology appointment was missed. - Reschedule appointment with endocrinologist. - Plan to check labs, including A1c, at next visit.  Vitamin D  deficiency Noted, and she is on prescription vitamin D . Previous  levels were very low. - Refilled prescription vitamin D . - Will check vitamin D  levels at next visit.      Patients who are on anti-obesity medications are counseled on the importance of maintaining healthy lifestyle habits, including balanced nutrition, regular physical activity, and behavioral modifications,  Medication is an adjunct to, not a replacement for, lifestyle changes and that the long-term success and weight maintenance depend on continued adherence to these strategies.   Fabian was informed of the importance of frequent follow up visits to maximize her success with intensive lifestyle modifications for her obesity and obesity related health conditions as recommended by USPSTF and CMS guidelines  Louann Penton, MD   "
